# Patient Record
Sex: Male | Born: 1945 | Race: Black or African American | Hispanic: No | Smoking: Former smoker
Health system: Southern US, Community
[De-identification: ages and names within clinical notes are randomized; demographics above are authoritative.]

## PROBLEM LIST (undated history)

## (undated) ENCOUNTER — Ambulatory Visit: Admission: EM | Payer: 59 | Source: Home / Self Care

## (undated) DIAGNOSIS — E785 Hyperlipidemia, unspecified: Secondary | ICD-10-CM

## (undated) DIAGNOSIS — L910 Hypertrophic scar: Secondary | ICD-10-CM

## (undated) DIAGNOSIS — Z7901 Long term (current) use of anticoagulants: Secondary | ICD-10-CM

## (undated) DIAGNOSIS — I639 Cerebral infarction, unspecified: Secondary | ICD-10-CM

## (undated) DIAGNOSIS — F1910 Other psychoactive substance abuse, uncomplicated: Secondary | ICD-10-CM

## (undated) DIAGNOSIS — I10 Essential (primary) hypertension: Secondary | ICD-10-CM

## (undated) DIAGNOSIS — I2699 Other pulmonary embolism without acute cor pulmonale: Secondary | ICD-10-CM

## (undated) DIAGNOSIS — I5022 Chronic systolic (congestive) heart failure: Secondary | ICD-10-CM

## (undated) DIAGNOSIS — I2510 Atherosclerotic heart disease of native coronary artery without angina pectoris: Secondary | ICD-10-CM

## (undated) DIAGNOSIS — I513 Intracardiac thrombosis, not elsewhere classified: Secondary | ICD-10-CM

## (undated) HISTORY — DX: Hypertrophic scar: L91.0

## (undated) HISTORY — DX: Essential (primary) hypertension: I10

## (undated) HISTORY — DX: Cerebral infarction, unspecified: I63.9

## (undated) HISTORY — DX: Other psychoactive substance abuse, uncomplicated: F19.10

## (undated) HISTORY — DX: Long term (current) use of anticoagulants: Z79.01

## (undated) HISTORY — DX: Other pulmonary embolism without acute cor pulmonale: I26.99

## (undated) HISTORY — DX: Hyperlipidemia, unspecified: E78.5

## (undated) HISTORY — DX: Atherosclerotic heart disease of native coronary artery without angina pectoris: I25.10

## (undated) HISTORY — DX: Intracardiac thrombosis, not elsewhere classified: I51.3

## (undated) HISTORY — DX: Chronic systolic (congestive) heart failure: I50.22

---

## 1996-04-07 DIAGNOSIS — I2510 Atherosclerotic heart disease of native coronary artery without angina pectoris: Secondary | ICD-10-CM

## 1996-04-07 HISTORY — DX: Atherosclerotic heart disease of native coronary artery without angina pectoris: I25.10

## 1996-06-05 HISTORY — PX: PTCA: SHX146

## 2000-11-11 ENCOUNTER — Emergency Department (HOSPITAL_COMMUNITY): Admission: EM | Admit: 2000-11-11 | Discharge: 2000-11-11 | Payer: Self-pay | Admitting: *Deleted

## 2002-04-21 ENCOUNTER — Emergency Department (HOSPITAL_COMMUNITY): Admission: EM | Admit: 2002-04-21 | Discharge: 2002-04-21 | Payer: Self-pay | Admitting: Internal Medicine

## 2002-06-14 ENCOUNTER — Emergency Department (HOSPITAL_COMMUNITY): Admission: EM | Admit: 2002-06-14 | Discharge: 2002-06-14 | Payer: Self-pay | Admitting: *Deleted

## 2002-06-14 ENCOUNTER — Encounter: Payer: Self-pay | Admitting: *Deleted

## 2002-06-18 ENCOUNTER — Encounter: Payer: Self-pay | Admitting: Cardiology

## 2002-06-20 ENCOUNTER — Inpatient Hospital Stay (HOSPITAL_COMMUNITY): Admission: AD | Admit: 2002-06-20 | Discharge: 2002-06-23 | Payer: Self-pay | Admitting: Cardiology

## 2002-06-21 ENCOUNTER — Encounter: Payer: Self-pay | Admitting: Cardiology

## 2002-09-12 ENCOUNTER — Encounter (HOSPITAL_COMMUNITY): Admission: RE | Admit: 2002-09-12 | Discharge: 2002-10-12 | Payer: Self-pay | Admitting: Cardiology

## 2002-09-12 ENCOUNTER — Encounter: Payer: Self-pay | Admitting: Cardiology

## 2004-03-20 ENCOUNTER — Emergency Department (HOSPITAL_COMMUNITY): Admission: EM | Admit: 2004-03-20 | Discharge: 2004-03-20 | Payer: Self-pay | Admitting: *Deleted

## 2004-08-30 ENCOUNTER — Ambulatory Visit: Payer: Self-pay | Admitting: *Deleted

## 2004-11-08 ENCOUNTER — Emergency Department (HOSPITAL_COMMUNITY): Admission: EM | Admit: 2004-11-08 | Discharge: 2004-11-08 | Payer: Self-pay | Admitting: Emergency Medicine

## 2005-07-28 ENCOUNTER — Ambulatory Visit: Payer: Self-pay | Admitting: *Deleted

## 2005-08-25 ENCOUNTER — Emergency Department (HOSPITAL_COMMUNITY): Admission: EM | Admit: 2005-08-25 | Discharge: 2005-08-25 | Payer: Self-pay | Admitting: Emergency Medicine

## 2006-07-20 ENCOUNTER — Ambulatory Visit: Payer: Self-pay | Admitting: Cardiovascular Disease

## 2006-07-28 ENCOUNTER — Ambulatory Visit: Payer: Self-pay | Admitting: Cardiovascular Disease

## 2006-07-28 ENCOUNTER — Encounter (HOSPITAL_COMMUNITY): Admission: RE | Admit: 2006-07-28 | Discharge: 2006-08-27 | Payer: Self-pay | Admitting: Cardiovascular Disease

## 2006-11-09 ENCOUNTER — Ambulatory Visit (HOSPITAL_COMMUNITY): Admission: RE | Admit: 2006-11-09 | Discharge: 2006-11-09 | Payer: Self-pay | Admitting: Gastroenterology

## 2006-11-09 ENCOUNTER — Ambulatory Visit: Payer: Self-pay | Admitting: Gastroenterology

## 2007-07-22 ENCOUNTER — Ambulatory Visit: Payer: Self-pay | Admitting: Cardiovascular Disease

## 2007-11-20 ENCOUNTER — Emergency Department (HOSPITAL_COMMUNITY): Admission: EM | Admit: 2007-11-20 | Discharge: 2007-11-20 | Payer: Self-pay | Admitting: Emergency Medicine

## 2007-11-28 ENCOUNTER — Emergency Department (HOSPITAL_COMMUNITY): Admission: EM | Admit: 2007-11-28 | Discharge: 2007-11-28 | Payer: Self-pay | Admitting: Emergency Medicine

## 2008-06-05 ENCOUNTER — Ambulatory Visit: Payer: Self-pay | Admitting: Physician Assistant

## 2008-06-08 ENCOUNTER — Ambulatory Visit: Payer: Self-pay | Admitting: Cardiology

## 2008-06-08 ENCOUNTER — Encounter: Payer: Self-pay | Admitting: Cardiology

## 2008-06-08 ENCOUNTER — Ambulatory Visit (HOSPITAL_COMMUNITY): Admission: RE | Admit: 2008-06-08 | Discharge: 2008-06-08 | Payer: Self-pay | Admitting: Cardiology

## 2008-11-24 DIAGNOSIS — I10 Essential (primary) hypertension: Secondary | ICD-10-CM | POA: Insufficient documentation

## 2008-11-24 DIAGNOSIS — E785 Hyperlipidemia, unspecified: Secondary | ICD-10-CM | POA: Insufficient documentation

## 2008-11-24 DIAGNOSIS — E782 Mixed hyperlipidemia: Secondary | ICD-10-CM | POA: Insufficient documentation

## 2008-12-14 ENCOUNTER — Ambulatory Visit: Payer: Self-pay | Admitting: Cardiovascular Disease

## 2009-06-27 ENCOUNTER — Emergency Department (HOSPITAL_COMMUNITY): Admission: EM | Admit: 2009-06-27 | Discharge: 2009-06-27 | Payer: Self-pay | Admitting: Emergency Medicine

## 2009-09-20 ENCOUNTER — Telehealth (INDEPENDENT_AMBULATORY_CARE_PROVIDER_SITE_OTHER): Payer: Self-pay

## 2009-09-25 ENCOUNTER — Ambulatory Visit: Payer: Self-pay | Admitting: Cardiovascular Disease

## 2009-12-19 ENCOUNTER — Emergency Department (HOSPITAL_COMMUNITY)
Admission: EM | Admit: 2009-12-19 | Discharge: 2009-12-20 | Payer: Self-pay | Source: Home / Self Care | Admitting: Emergency Medicine

## 2010-01-01 ENCOUNTER — Encounter: Payer: Self-pay | Admitting: Adult Health

## 2010-01-01 ENCOUNTER — Ambulatory Visit: Payer: Self-pay | Admitting: Cardiology

## 2010-01-01 ENCOUNTER — Encounter (INDEPENDENT_AMBULATORY_CARE_PROVIDER_SITE_OTHER): Payer: Self-pay | Admitting: *Deleted

## 2010-01-11 ENCOUNTER — Ambulatory Visit: Payer: Self-pay | Admitting: Cardiology

## 2010-01-11 ENCOUNTER — Encounter (HOSPITAL_COMMUNITY): Admission: RE | Admit: 2010-01-11 | Discharge: 2010-01-11 | Payer: Self-pay | Admitting: Cardiology

## 2010-01-15 ENCOUNTER — Ambulatory Visit: Payer: Self-pay | Admitting: Cardiology

## 2010-01-15 DIAGNOSIS — R931 Abnormal findings on diagnostic imaging of heart and coronary circulation: Secondary | ICD-10-CM | POA: Insufficient documentation

## 2010-03-07 DIAGNOSIS — I2699 Other pulmonary embolism without acute cor pulmonale: Secondary | ICD-10-CM

## 2010-03-07 HISTORY — DX: Other pulmonary embolism without acute cor pulmonale: I26.99

## 2010-03-13 ENCOUNTER — Inpatient Hospital Stay (HOSPITAL_COMMUNITY)
Admission: EM | Admit: 2010-03-13 | Discharge: 2010-03-26 | Disposition: A | Discharge status: Other Acute Inpatient Hospital | Payer: Self-pay | Source: Home / Self Care | Attending: Internal Medicine | Admitting: Internal Medicine

## 2010-03-14 ENCOUNTER — Inpatient Hospital Stay (HOSPITAL_COMMUNITY): Admission: EM | Admit: 2010-03-14 | Discharge: 2010-03-26 | Payer: Self-pay | Attending: Surgery | Admitting: Surgery

## 2010-03-15 ENCOUNTER — Encounter: Payer: Self-pay | Admitting: Surgery

## 2010-03-15 ENCOUNTER — Encounter: Payer: Self-pay | Admitting: Cardiology

## 2010-03-18 HISTORY — PX: CORONARY ARTERY BYPASS GRAFT: SHX141

## 2010-03-30 ENCOUNTER — Emergency Department (HOSPITAL_COMMUNITY)
Admission: EM | Admit: 2010-03-30 | Discharge: 2010-03-30 | Disposition: A | Discharge status: Other Acute Inpatient Hospital | Payer: Self-pay | Source: Home / Self Care | Admitting: Emergency Medicine

## 2010-03-30 ENCOUNTER — Inpatient Hospital Stay (HOSPITAL_COMMUNITY)
Admission: EM | Admit: 2010-03-30 | Discharge: 2010-04-09 | Payer: Self-pay | Attending: Cardiology | Admitting: Cardiology

## 2010-04-02 ENCOUNTER — Encounter: Payer: Self-pay | Admitting: Cardiology

## 2010-04-08 ENCOUNTER — Encounter (INDEPENDENT_AMBULATORY_CARE_PROVIDER_SITE_OTHER): Payer: Self-pay | Admitting: Cardiology

## 2010-04-09 ENCOUNTER — Ambulatory Visit: Admit: 2010-04-09 | Payer: Self-pay | Admitting: Physician Assistant

## 2010-04-11 ENCOUNTER — Ambulatory Visit
Admission: RE | Admit: 2010-04-11 | Discharge: 2010-04-11 | Payer: Self-pay | Source: Home / Self Care | Attending: Cardiology | Admitting: Cardiology

## 2010-04-11 LAB — CONVERTED CEMR LAB: POC INR: 4

## 2010-04-15 ENCOUNTER — Ambulatory Visit
Admission: RE | Admit: 2010-04-15 | Discharge: 2010-04-15 | Payer: Self-pay | Source: Home / Self Care | Attending: Surgery | Admitting: Surgery

## 2010-04-15 ENCOUNTER — Encounter
Admission: RE | Admit: 2010-04-15 | Discharge: 2010-04-15 | Payer: Self-pay | Source: Home / Self Care | Attending: Surgery | Admitting: Surgery

## 2010-04-19 ENCOUNTER — Ambulatory Visit: Admission: RE | Admit: 2010-04-19 | Discharge: 2010-04-19 | Payer: Self-pay | Source: Home / Self Care

## 2010-04-19 LAB — CONVERTED CEMR LAB: POC INR: 2.5

## 2010-04-26 ENCOUNTER — Encounter: Payer: Self-pay | Admitting: Adult Health

## 2010-04-26 ENCOUNTER — Ambulatory Visit
Admission: RE | Admit: 2010-04-26 | Discharge: 2010-04-26 | Payer: Self-pay | Source: Home / Self Care | Attending: Adult Health | Admitting: Adult Health

## 2010-04-26 ENCOUNTER — Telehealth (INDEPENDENT_AMBULATORY_CARE_PROVIDER_SITE_OTHER): Payer: Self-pay

## 2010-05-02 ENCOUNTER — Ambulatory Visit: Admission: RE | Admit: 2010-05-02 | Discharge: 2010-05-02 | Payer: Self-pay | Source: Home / Self Care

## 2010-05-02 LAB — CONVERTED CEMR LAB: POC INR: 4.3

## 2010-05-07 ENCOUNTER — Ambulatory Visit
Admission: RE | Admit: 2010-05-07 | Discharge: 2010-05-07 | Payer: Self-pay | Source: Home / Self Care | Attending: Surgery | Admitting: Surgery

## 2010-05-07 ENCOUNTER — Telehealth (INDEPENDENT_AMBULATORY_CARE_PROVIDER_SITE_OTHER): Payer: Self-pay | Admitting: *Deleted

## 2010-05-07 NOTE — Assessment & Plan Note (Signed)
Summary: past due for 6 mth f/u per pt instructions/tg   Visit Type:  Follow-up Primary Provider:  [name]  CC:  no cardiology complaints.  History of Present Illness: [name] is seen today for F/U of CAD, HTN and hyperlipidemia.  He has a distant history of IMI with stenting in 98 and repeat intervention to the RCA and LAD in 2004.  He was last seen by Tereso Newcomer in 2009.  His primary is [name].  The patient is on disability and is sedentary.  He smokes marajuana on occasion but no cigarettes.  He denies SSCP, palpitations, SOB, PND or othopnea.  He has been compliant with his meds.  He is due to get blood work at Dr. Valeda Malm office this month.  He indicates that his cholesterol is good and liver normal.  He inquired about Viagra and I discouraged him from using it given his CAD  Current Problems (verified): 1)  Drug Abuse, Hx of  (ICD-V15.89) 2)  Hyperlipidemia  (ICD-272.4) 3)  Hypertension  (ICD-401.9) 4)  Cad  (ICD-414.00)  Current Medications (verified): 1)  Aspirin 325 Mg Tabs (Aspirin) .... Take 1 Tab Daily 2)  Metoprolol Succinate 50 Mg Xr24h-Tab (Metoprolol Succinate) .... Take 1 1/2 Tab Daily 3)  Simvastatin 40 Mg Tabs (Simvastatin) .... Take 1 Tab Daily  Allergies (verified): 1)  ! * Lisinopril  Past History:  Past Medical History: Last updated: 12-14-08 Current Problems:  DRUG ABUSE, HX OF (ICD-V15.89) HYPERLIPIDEMIA (ICD-272.4) HYPERTENSION (ICD-401.9) CAD (ICD-414.00)  Past Surgical History: Last updated: 2008-12-14 cath (3/04)  3/98PTCA LAD and RCA  Family History: Last updated: 2008/12/14 Father:deceased cause unknown Mother:deceased in 40's due to cancer type unknown  Social History: Last updated: 14-Dec-2008 Full Time Married  Tobacco Use - Yes.  Alcohol Use - no Regular Exercise - no Drug Use - yes(cocaine abuse former)  Review of Systems       Denies fever, malais, weight loss, blurry vision, decreased visual acuity, cough, sputum, SOB,  hemoptysis, pleuritic pain, palpitaitons, heartburn, abdominal pain, melena, lower extremity edema, claudication, or rash.   Vital Signs:  Patient profile:   65 year old male Weight:      220 pounds BMI:     35.64 Pulse rate:   65 / minute BP sitting:   128 / 80  (right arm)  Vitals Entered By: Dreama Saa, CNA (September 25, 2009 11:47 AM)  Physical Exam  General:  Affect appropriate Healthy:  appears stated age HEENT: normal Neck supple with no adenopathy JVP normal no bruits no thyromegaly Lungs clear with no wheezing and good diaphragmatic motion Heart:  S1/S2 no murmur,rub, gallop or click PMI normal Abdomen: benighn, BS positve, no tenderness, no AAA no bruit.  No HSM or HJR Distal pulses intact with no bruits No edema Neuro non-focal Skin warm and dry    Impression & Recommendations:  Problem # 1:  CAD (ICD-414.00) Stable no angina.  Continue ASA and BB The following medications were removed from the medication list:    Lisinopril 20 Mg Tabs (Lisinopril) .Marland Kitchen... Take 1 tablet by mouth once a day His updated medication list for this problem includes:    Aspirin 325 Mg Tabs (Aspirin) .Marland Kitchen... Take 1 tab daily    Metoprolol Succinate 50 Mg Xr24h-tab (Metoprolol succinate) .Marland Kitchen... Take 1 1/2 tab daily  Problem # 2:  HYPERLIPIDEMIA (ICD-272.4) Continue simvastatin F/U labs primary His updated medication list for this problem includes:    Simvastatin 40 Mg Tabs (Simvastatin) .Marland Kitchen... Take 1 tab  daily  Problem # 3:  HYPERTENSION (ICD-401.9) Well controlled The following medications were removed from the medication list:    Lisinopril 20 Mg Tabs (Lisinopril) .Marland Kitchen... Take 1 tablet by mouth once a day His updated medication list for this problem includes:    Aspirin 325 Mg Tabs (Aspirin) .Marland Kitchen... Take 1 tab daily    Metoprolol Succinate 50 Mg Xr24h-tab (Metoprolol succinate) .Marland Kitchen... Take 1 1/2 tab daily  Problem # 4:  DRUG ABUSE, HX OF (ICD-V15.89) Indicates he is no longer using  marajuana.    Patient Instructions: 1)  Your physician recommends that you schedule a follow-up appointment in: 6 months 2)  Your physician recommends that you continue on your current medications as directed. Please refer to the Current Medication list given to you today.

## 2010-05-07 NOTE — Assessment & Plan Note (Signed)
Summary: POST ED VISIT ON 12/19/09 PER PT REQUEST/TG   Visit Type:  Follow-up Primary Provider:  [name]  CC:  no cardiology complaints.  History of Present Illness: [name] is a pleasant  65 y/o AAM we are seeing on follow-up after being evaluated in the ER for complaints of RUQ pain radiating into the chest.  He has a history of CAD with inferior Mi and stent to RCA and LAD in 2004, hypertension, hyperlipidemia and former marijana abuse.  He was seen in the ER on 12/20/2009 and was negative for MI with 3 sets of cardiac enzymes cycled and found to be negative.  Review of EKG from ER did not show acute changes.  The pain lasted approximately 15 minutes and started in the RUQ and radiated midsternally. This pain was different from angina pain which occured prior to stents in 2004 which is described as midsternal chest pressure and pain.  He has not had recurrence of pain since ER visit.  Current Medications (verified): 1)  Aspirin 325 Mg Tabs (Aspirin) .... Take 1 Tab Daily 2)  Metoprolol Succinate 50 Mg Xr24h-Tab (Metoprolol Succinate) .... Take 1 1/2 Tab Daily 3)  Simvastatin 40 Mg Tabs (Simvastatin) .... Take 1 Tab Daily 4)  Nitrostat 0.4 Mg Subl (Nitroglycerin) .Marland Kitchen.. 1 Tablet Under Tongue At Onset of Chest Pain; You May Repeat Every 5 Minutes For Up To 3 Doses.  Allergies (verified): 1)  ! * Lisinopril  Past History:  Past medical, surgical, family and social histories (including risk factors) reviewed, and no changes noted (except as noted below).  Past Medical History: Reviewed history from 11/24/2008 and no changes required. Current Problems:  DRUG ABUSE, HX OF (ICD-V15.89) HYPERLIPIDEMIA (ICD-272.4) HYPERTENSION (ICD-401.9) CAD (ICD-414.00)  Past Surgical History: Reviewed history from 11/24/2008 and no changes required. cath (3/04)  3/98PTCA LAD and RCA  Family History: Reviewed history from 11/24/2008 and no changes required. Father:deceased cause  unknown Mother:deceased in 40's due to cancer type unknown  Social History: Reviewed history from 11/24/2008 and no changes required. Full Time Married  Tobacco Use - Yes.  Alcohol Use - no Regular Exercise - no Drug Use - yes(cocaine abuse former)  Review of Systems       Chest pain All other systems have been reviewed and are negative unless stated above.   Vital Signs:  Patient profile:   65 year old male Weight:      217 pounds BMI:     35.15 Pulse rate:   72 / minute BP sitting:   139 / 87  (right arm)  Vitals Entered By: Dreama Saa, CNA (January 01, 2010 2:48 PM)  Physical Exam  General:  Well developed, well nourished, in no acute distress. Lungs:  Clear bilaterally to auscultation and percussion. Heart:  Non-displaced PMI, chest non-tender; regular rate and rhythm, S1, S2 without murmurs, rubs or gallops. Carotid upstroke normal, no bruit. Normal abdominal aortic size, no bruits. Femorals normal pulses, no bruits. Pedals normal pulses. No edema, no varicosities. Abdomen:  Bowel sounds positive; abdomen soft and non-tender without masses, organomegaly, or hernias noted. No hepatosplenomegaly. Msk:  Back normal, normal gait. Muscle strength and tone normal. Pulses:  pulses normal in all 4 extremities Extremities:  No clubbing or cyanosis. Neurologic:  Alert and oriented x 3. Psych:  Normal affect.   EKG  Procedure date:  01/01/2010  Findings:      Normal sinus rhythm with rate of:  69 bpm  Impression & Recommendations:  Problem # 1:  CAD (ICD-414.00) His chest pain appears atypical with orgination beginning in the RUQ of the abdomen and is not similar to the pain he experienced with prior ischemic events.  His assessment is normal.  There is not evidence of new ischemia on EKG.  Plan stress myoview to evaluate for ischemia with known history of CAD.  He is given a RX for NTG.  He will continue his same medications and follow-up with Dr. Eden Emms.  IF  negative test may need to evaluate GI etiiology for discomfort as it originated in the abdomen. His updated medication list for this problem includes:    Aspirin 325 Mg Tabs (Aspirin) .Marland Kitchen... Take 1 tab daily    Metoprolol Succinate 50 Mg Xr24h-tab (Metoprolol succinate) .Marland Kitchen... Take 1 1/2 tab daily    Nitrostat 0.4 Mg Subl (Nitroglycerin) .Marland Kitchen... 1 tablet under tongue at onset of chest pain; you may repeat every 5 minutes for up to 3 doses.  Orders: Nuclear Stress Test (Nuc Stress Test)  Problem # 2:  HYPERLIPIDEMIA (ICD-272.4) Assessment: Unchanged  His updated medication list for this problem includes:    Simvastatin 40 Mg Tabs (Simvastatin) .Marland Kitchen... Take 1 tab daily  Problem # 3:  HYPERTENSION (ICD-401.9) Well controlled at this time. His updated medication list for this problem includes:    Aspirin 325 Mg Tabs (Aspirin) .Marland Kitchen... Take 1 tab daily    Metoprolol Succinate 50 Mg Xr24h-tab (Metoprolol succinate) .Marland Kitchen... Take 1 1/2 tab daily  Orders: Nuclear Stress Test (Nuc Stress Test)  Patient Instructions: 1)  Your physician recommends that you schedule a follow-up appointment in: after testing 2)  Your physician has requested that you have an exercise stress myoview.  For further information please visit [email or web address]  Please follow instruction sheet, as given. Prescriptions: NITROSTAT 0.4 MG SUBL (NITROGLYCERIN) 1 tablet under tongue at onset of chest pain; you may repeat every 5 minutes for up to 3 doses.  #25 x 2   Entered by:   Teressa Lower RN   Authorized by:   Joni Reining, NP   Signed by:   Teressa Lower RN on 01/01/2010   Method used:   [facility] to        [facility] [phone][facility] (retail)       [address]       [place], [place]  [address]       Ph: [phone] or [phone]       Fax: [phone]   RxID:   [id number]

## 2010-05-07 NOTE — Progress Notes (Signed)
**Note De-Identified [facility] Obfuscation** Summary: refills  Phone Note Outgoing Call   Call placed by: Larita Fife Damyah Gugel LPN,  September 20, 2009 3:54 PM Summary of Call: According to OV note from 12-14-08 pt. was to f/u in 6 months with Dr. Eden Emms and needs refills. Pt. agreed to schedule appt. which is 09-25-09. [facility] [place].  Initial call taken by: Larita Fife Shyleigh Daughtry LPN,  September 20, 2009 4:05 PM    Prescriptions: METOPROLOL SUCCINATE 50 MG XR24H-TAB (METOPROLOL SUCCINATE) take 1 1/2 tab daily  #45 x 0   Entered by:   Larita Fife Deandre Stansel LPN   Authorized by:   Colon Branch, MD, [facility]   Signed by:   Larita Fife Jayanth Szczesniak LPN on 44/04/270   Method used:   Electronically to        [facility] [phone]* (retail)       [address]       [place], [place]  [address]       Ph: [phone] or [phone]       Fax: [phone]   RxID:   [id number]

## 2010-05-07 NOTE — Assessment & Plan Note (Signed)
Summary: 6 MN F/U PER CKOUT 06/05/08-DSF  Medications Added LISINOPRIL 20 MG TABS (LISINOPRIL) Take 1 tablet by mouth once a day      Allergies Added:   History of Present Illness: [name] is seen today for F/U of CAD, HTN and hyperlipidemia.  He has a distant history of IMI with stenting in 98 and repeat intervention to the RCA and LAD in 2004.  He was last seen by Tereso Newcomer in 2009.  His primary is [name].  The patient is on disability and is sedentary.  He smokes marajuana on occasion but no cigarettes.  He denies SSCP, palpitations, SOB, PND or othopnea.  He has been compliant with his meds.  He is due to get blood work at Dr. Valeda Malm office this month.    Current Problems (verified): 1)  Drug Abuse, Hx of  (ICD-V15.89) 2)  Hyperlipidemia  (ICD-272.4) 3)  Hypertension  (ICD-401.9) 4)  Cad  (ICD-414.00)  Current Medications (verified): 1)  Aspirin 325 Mg Tabs (Aspirin) .... Take 1 Tab Daily 2)  Metoprolol Succinate 50 Mg Xr24h-Tab (Metoprolol Succinate) .... Take 1 1/2 Tab Daily 3)  Simvastatin 40 Mg Tabs (Simvastatin) .... Take 1 Tab Daily  Allergies (verified): 1)  ! * Lisinopril  Past History:  Past Medical History: Last updated: December 06, 2008 Current Problems:  DRUG ABUSE, HX OF (ICD-V15.89) HYPERLIPIDEMIA (ICD-272.4) HYPERTENSION (ICD-401.9) CAD (ICD-414.00)  Past Surgical History: Last updated: 06-Dec-2008 cath (3/04)  3/98PTCA LAD and RCA  Family History: Last updated: Dec 06, 2008 Father:deceased cause unknown Mother:deceased in 40's due to cancer type unknown  Social History: Last updated: 2008/12/06 Full Time Married  Tobacco Use - Yes.  Alcohol Use - no Regular Exercise - no Drug Use - yes(cocaine abuse former)  Review of Systems       Denies fever, malais, weight loss, blurry vision, decreased visual acuity, cough, sputum, SOB, hemoptysis, pleuritic pain, palpitaitons, heartburn, abdominal pain, melena, lower extremity edema, claudication, or  rash. All other systems reviewed and negative  Vital Signs:  Patient profile:   65 year old male Height:      66 inches Weight:      225 pounds BMI:     36.45 Pulse rate:   76 / minute Pulse rhythm:   regular Resp:     14 per minute BP sitting:   120 / 78  (right arm)  Vitals Entered By: Dreama Saa, CNA (December 14, 2008 9:53 AM)  Physical Exam  General:  Affect appropriate Healthy:  appears stated age HEENT: normal Neck supple with no adenopathy JVP normal no bruits no thyromegaly Lungs clear with no wheezing and good diaphragmatic motion Heart:  S1/S2 no murmur,rub, gallop or click PMI normal Abdomen: benighn, BS positve, no tenderness, no AAA no bruit.  No HSM or HJR Distal pulses intact with no bruits No edema Neuro non-focal Skin warm and dry    Impression & Recommendations:  Problem # 1:  CAD (ICD-414.00) Stabel no angina continue ASA and BB His updated medication list for this problem includes:    Aspirin 325 Mg Tabs (Aspirin) .Marland Kitchen... Take 1 tab daily    Metoprolol Succinate 50 Mg Xr24h-tab (Metoprolol succinate) .Marland Kitchen... Take 1 1/2 tab daily    Lisinopril 20 Mg Tabs (Lisinopril) .Marland Kitchen... Take 1 tablet by mouth once a day  Problem # 2:  HYPERLIPIDEMIA (ICD-272.4) F/U labs with Dr. Felecia Shelling no myalgias or side effects.  Target LDL 80 His updated medication list for this problem includes:    Simvastatin 40  Mg Tabs (Simvastatin) .Marland Kitchen... Take 1 tab daily  Problem # 3:  HYPERTENSION (ICD-401.9) Well controlled continue low sodium diet His updated medication list for this problem includes:    Aspirin 325 Mg Tabs (Aspirin) .Marland Kitchen... Take 1 tab daily    Metoprolol Succinate 50 Mg Xr24h-tab (Metoprolol succinate) .Marland Kitchen... Take 1 1/2 tab daily    Lisinopril 20 Mg Tabs (Lisinopril) .Marland Kitchen... Take 1 tablet by mouth once a day  Patient Instructions: 1)  Your physician recommends that you schedule a follow-up appointment in: 6 months 2)  Your physician recommends that you continue on  your current medications as directed. Please refer to the Current Medication list given to you today.

## 2010-05-07 NOTE — Letter (Signed)
Summary: [facility] [facility] (Nuc Med Stress)  [facility]  [address] [place] [address]   Phone: [phone]  Fax: [phone]    Nuclear Medicine 1-Day Stress Test Information Sheet  Re:     [name]   DOB:     14-Jun-1945 MRN:     [id number] Weight:  Appointment Date: Register at: Appointment Time: Referring MD:  _x__Exercise Stress  __Adenosine   __Dobutamine  __Lexiscan  __Persantine   __Thallium  Urgency: ____1 (next day)   ____2 (one week)    ____3 (PRN)  Patient will receive Follow Up call with results: Patient needs follow-up appointment:  Instructions regarding medication:  How to prepare for your stress test: 1.NOTHING TO EAT OR DRINK AFTER MIDNIGHT TO NOT TAKE YOUR AM MEDICATION BEFORE THE TEST 2. DO NOT use any tobacco products for at leaset 8 hours prior to arrival. 3. DO NOT wear dresses or any clothing that may have metal clasps or buttons. 4. Wear short sleeve shirts, loose clothing, and comfortalbe walking shoes. 5. DO NOT use lotions, oils or powder on your chest before the test. 6. The test will take approximately 3-4 hours from the time you arrive until completion. 7. To register the day of the test, go to the Short Stay entrance at [facility]. 8. If you must cancel your test, call [phone] as soon as you are aware.  After you arrive for test:   When you arrive at [facility], you will go to Short Stay to be registered. They will then send you to Radiology to check in. The Nuclear Medicine Tech will get you and start an IV in your arm or hand. A small amount of a radioactive tracer will then be injected into your IV. This tracer will then have to circulate for 30-45 minutes. During this time you will wait in the waiting room and you will be able to drink something without caffeine. A series of pictures will be taken of your heart follwoing this waiting period. After the 1st set of pictures you will go to the  stress lab to get ready for your stress test. During the stress test, another small amount of a radioactive tracer will be injected through your IV. When the stress test is complete, there is a short rest period while your heart rate and blood pressure will be monitored. When this monitoring period is complete you will have another set of pictrues taken. (The same as the 1st set of pictures). These pictures are taken between 15 minutes and 1 hour after the stress test. The time depends on the type of stress test you had. Your doctor will inform you of your test results within 7 days after test.    The possibilities of certain changes are possible during the test. They include abnormal blood pressure and disorders of the heart. Side effects of persantine or adenosine can include flushing, chest pain, shortness of breath, stomach tightness, headache and light-headedness. These side effects usually do not last long and are self-resolving. Every effort will be made to keep you comfortable and to minimize complications by obtaining a medical history and by close observation during the test. Emergency equipment, medications, and trained personnel are available to deal with any unusual situation which may arise.  Please notify office at least 48 hours in advance if you are unable to keep this appt.

## 2010-05-07 NOTE — Assessment & Plan Note (Signed)
Summary: F/U MYOVIEW TO BE DONE ON 01/11/10/TG   Visit Type:  Follow-up Primary Provider:  [name]   History of Present Illness: [name] is a 65 y/o male with known history of CAD with inferior MI and stent to the RCA and LAD in 2004, hypertension, hyperlipidemia and former marijuana use.  He was seen in the ER 12/20/2009 for chest pain, right sided and was ruled out for MI.  He saw [place] on follow-up where a stress myoview was ordered. He is here for the results.  He denies any recurrent chest pain since the ER visit.  Current Medications (verified): 1)  Aspirin 325 Mg Tabs (Aspirin) .... Take 1 Tab Daily 2)  Metoprolol Succinate 50 Mg Xr24h-Tab (Metoprolol Succinate) .... Take 1 1/2 Tab Daily 3)  Simvastatin 40 Mg Tabs (Simvastatin) .... Take 1 Tab Daily 4)  Nitrostat 0.4 Mg Subl (Nitroglycerin) .Marland Kitchen.. 1 Tablet Under Tongue At Onset of Chest Pain; You May Repeat Every 5 Minutes For Up To 3 Doses.  Allergies (verified): 1)  ! * Lisinopril  Comments:  Nurse/Medical Assistant: patient brought med bottles and reviewed med list from previous visit  stated all med are correct  Review of Systems       All other systems have been reviewed and are negative unless stated above.   Vital Signs:  Patient profile:   65 year old male Weight:      218 pounds BMI:     35.31 Pulse rate:   64 / minute BP sitting:   128 / 81  (right arm)  Vitals Entered By: Dreama Saa, CNA (January 15, 2010 10:45 AM)  Physical Exam  General:  Well developed, well nourished, in no acute distress. Lungs:  Clear bilaterally to auscultation and percussion. Heart:  Non-displaced PMI, chest non-tender; regular rate and rhythm, S1, S2 without murmurs, rubs or gallops. Carotid upstroke normal, no bruit. Normal abdominal aortic size, no bruits. Femorals normal pulses, no bruits. Pedals normal pulses. No edema, no varicosities. Abdomen:  Obese Msk:  Back normal, normal gait. Muscle strength and tone  normal. Extremities:  No clubbing or cyanosis. Neurologic:  Alert and oriented x 3. Psych:  Normal affect.   Impression & Recommendations:  Problem # 1:  ABNORMAL CV (STRESS) TEST (ICD-794.39) I have reviewed the nuclear study report which demonstrated abnormal impaired exercise capasity, normal LV size, minimally impaired LV systolic fx. A small and mild infereolateral defect was present showing no reversibility. Moderate apical defect of mild to moderate intensity with minimal if any reverisibility was apparent.  EF was 40%.  I have discussed with the patient that there was a small area of abnormality and discussed possibility of cardiac catherization to evaluate further.  However, I waited to order until speaking with Dr. Dietrich Pates who also reviewed the test results and spoke with the patient.  He advises continued medical management at this time.  If the patient has recurrent chest pain he is to notify us.  Will see him in 6 months.  Problem # 2:  CORONARY ATHEROSCLEROSIS NATIVE CORONARY ARTERY (ICD-414.01) Assessment: Unchanged  His updated medication list for this problem includes:    Aspirin 325 Mg Tabs (Aspirin) .Marland Kitchen... Take 1 tab daily    Metoprolol Succinate 50 Mg Xr24h-tab (Metoprolol succinate) .Marland Kitchen... Take 1 1/2 tab daily    Nitrostat 0.4 Mg Subl (Nitroglycerin) .Marland Kitchen... 1 tablet under tongue at onset of chest pain; you may repeat every 5 minutes for up to 3 doses.  Problem # 3:  HYPERTENSION (ICD-401.9) Assessment: Unchanged  His updated medication list for this problem includes:    Aspirin 325 Mg Tabs (Aspirin) .Marland Kitchen... Take 1 tab daily    Metoprolol Succinate 50 Mg Xr24h-tab (Metoprolol succinate) .Marland Kitchen... Take 1 1/2 tab daily  Patient Instructions: 1)  Your physician recommends that you schedule a follow-up appointment in: 6 months 2)  Your physician recommends that you continue on your current medications as directed. Please refer to the Current Medication list given to you today.

## 2010-05-08 NOTE — Assessment & Plan Note (Signed)
OFFICE VISIT  [name], [name] DOB:  [birth year]                                        May 07, 2010 CHART #:  [id number]  The patient returned to my office today for examination of his chest incision status post coronary artery bypass graft surgery on March 18, 2010.  I last saw him in the office on April 15, 2010, at which time he was doing well.  He want me to check his chest incision as he says felt like there was a bulge at the top of the incision.  He has had no fever or chills.  He has had mild chest wall discomfort which is relieved with pain medicine.  He has getting ready to start cardiac rehab.  He continues on Coumadin for development of postoperative pulmonary emboli requiring readmission.  His INR is being followed by [facility].  PHYSICAL EXAMINATION:  Vital Signs:  Blood pressure 115/71, pulse 84 and regular, respiratory rate 16 unlabored, oxygen saturation is 94% on room air.  General:  He looks well.  The chest incision is healing well. There is slight protuberance at the top of his sternotomy, but they are different that is usually expected.  There is no sign of any infection or cellulitis.  There is no fluctuance.  His sternum is stable.  His leg incision is healing well.  Lungs:  Clear.  Cardiac:  Shows regular rate and rhythm with normal heart sounds.  IMPRESSION:  The patient's incision is doing fine.  I will see any cause for alarm.  I explained him that this is a normal appearance of the incision and that area will gradually flatten out over time.  He is happy with that and will continue to follow up with his cardiologist.  Evelene Croon, M.D. Electronically Signed  BB/MEDQ  D:  05/07/2010  T:  05/08/2010  Job:  [id number]

## 2010-05-09 NOTE — Medication Information (Signed)
Summary: ccr-lr  Anticoagulant Therapy  Managed by: Vashti Hey, RN PCP: [name] Supervising MD: Diona Browner MD, Remi Deter Indication 1: Pulmonary Embolism Lab Used: [facility] [place] Site: [place] INR POC 2.5  Dietary changes: no    Health status changes: no    Bleeding/hemorrhagic complications: no    Recent/future hospitalizations: no    Any changes in medication regimen? no    Recent/future dental: no  Any missed doses?: no       Is patient compliant with meds? yes       Allergies: 1)  ! * Lisinopril  Anticoagulation Management History:      The patient is taking warfarin and comes in today for a routine follow up visit.  Anticoagulation is being administered due to Bilateral Pulmonary Emboli.  Negative risk factors for bleeding include an age less than 49 years old, no history of CVA/TIA, no history of GI bleeding, and absence of serious comorbidities.  The bleeding index is 'low risk'.  Positive CHADS2 values include History of HTN.  Negative CHADS2 values include History of CHF, Age > 5 years old, History of Diabetes, and Prior Stroke/CVA/TIA.  The start date was 04/09/2010.  Anticoagulation responsible provider: Diona Browner MD, Remi Deter.  INR POC: 2.5.  Cuvette Lot#: [id number].    Anticoagulation Management Assessment/Plan:      The target INR is 2.0-3.0.  The next INR is due 05/02/2010.  Anticoagulation instructions were given to patient.  Results were reviewed/authorized by Vashti Hey, RN.  He was notified by Vashti Hey RN.         Prior Anticoagulation Instructions: INR 4.0 Hold coumadin tonight then decrease coumadin to 5mg  once daily except 2.5mg  on Mondays, Wednesdays and Fridays  Current Anticoagulation Instructions: INR 2.5 Continue coumadin 5mg  once daily except 2.5mg  on Mondays, Wednesdays and Fridays

## 2010-05-09 NOTE — Medication Information (Signed)
Summary: CCN  Anticoagulant Therapy  Managed by: Vashti Hey, RN PCP: Alvira Monday Supervising MD: Dietrich Pates MD, Molly Maduro Indication 1: Pulmonary Embolism Lab Used: [facility] [place] Site: [place] INR POC 4.0  Dietary changes: no    Health status changes: no    Bleeding/hemorrhagic complications: no    Recent/future hospitalizations: yes       Details: In [facility] 12/24 - 04/09/10  for bilateral pulm emboli after CABG  Any changes in medication regimen? yes       Details: Has warfarin 5mg  tablet and has been taking 5mg  qd since d/c  Recent/future dental: no  Any missed doses?: no       Is patient compliant with meds? yes       Allergies: 1)  ! * Lisinopril  Anticoagulation Management History:      The patient comes in today for his initial visit for anticoagulation therapy.  Anticoagulation is being administered due to Bilateral Pulmonary Emboli.  Negative risk factors for bleeding include an age less than 1 years old, no history of CVA/TIA, no history of GI bleeding, and absence of serious comorbidities.  The bleeding index is 'low risk'.  Positive CHADS2 values include History of HTN.  Negative CHADS2 values include History of CHF, Age > 37 years old, History of Diabetes, and Prior Stroke/CVA/TIA.  The start date was 04/09/2010.  Anticoagulation responsible provider: Dietrich Pates MD, Molly Maduro.  INR POC: 4.0.    Anticoagulation Management Assessment/Plan:      The target INR is 2.0-3.0.  The next INR is due 04/19/2010.  Anticoagulation instructions were given to patient.  Results were reviewed/authorized by Vashti Hey, RN.  He was notified by Vashti Hey RN.         Current Anticoagulation Instructions: INR 4.0 Hold coumadin tonight then decrease coumadin to 5mg  once daily except 2.5mg  on Mondays, Wednesdays and Fridays

## 2010-05-09 NOTE — Medication Information (Signed)
Summary: ccr-lr  Anticoagulant Therapy  Managed by: Vashti Hey, RN PCP: [name] Supervising MD: Dietrich Pates MD, Molly Maduro Indication 1: Pulmonary Embolism Lab Used: [facility] [place] Site: [place] INR POC 4.3  Dietary changes: no    Health status changes: no    Bleeding/hemorrhagic complications: no    Recent/future hospitalizations: no    Any changes in medication regimen? no    Recent/future dental: no  Any missed doses?: no       Is patient compliant with meds? yes       Allergies: 1)  ! * Lisinopril  Anticoagulation Management History:      The patient is taking warfarin and comes in today for a routine follow up visit.  Anticoagulation is being administered due to Bilateral Pulmonary Emboli.  Negative risk factors for bleeding include an age less than 29 years old, no history of CVA/TIA, no history of GI bleeding, and absence of serious comorbidities.  The bleeding index is 'low risk'.  Positive CHADS2 values include History of HTN.  Negative CHADS2 values include History of CHF, Age > 27 years old, History of Diabetes, and Prior Stroke/CVA/TIA.  The start date was 04/09/2010.  Anticoagulation responsible provider: Dietrich Pates MD, Molly Maduro.  INR POC: 4.3.  Cuvette Lot#: [id number].    Anticoagulation Management Assessment/Plan:      The patient's current anticoagulation dose is Warfarin sodium 5 mg tabs: m,w,fri 2.5mg  all otherdays he takes 5.  The target INR is 2.0-3.0.  The next INR is due 05/16/2010.  Anticoagulation instructions were given to patient.  Results were reviewed/authorized by Vashti Hey, RN.  He was notified by Vashti Hey RN.         Prior Anticoagulation Instructions: INR 2.5 Continue coumadin 5mg  once daily except 2.5mg  on Mondays, Wednesdays and Fridays  Current Anticoagulation Instructions: INR 4.3 Hold coumadin tonight then decrease dose to 2.5mg  once daily except 5mg  on Tuesdays and Saturdays

## 2010-05-09 NOTE — Assessment & Plan Note (Signed)
Summary: **EPH MC/TMJ   Visit Type:  Follow-up Primary Provider:  [name]   History of Present Illness: [name] is a 65 y/o AAM we are seeing on follow post CABG in the setting of severe 3 vessel CAD with complications of Bilateral pulmonary emboli 1 week later after that discharge.  [name] had CABG  on Mar 18, 2010 (LIMA-LAD, SVG-Diagonal, and SVG to OM1.  He was found to have PE when after passing a constipated stool he became very weak and diaphoretic.  He was readmitted on 03/24/2010 with low 02 sat and CT demonstrated bilateral PE.  He was treated with heparin and has since been placed on coumadin where he is followed by the [facility] office. Last visit one week ago INR 2.5.  He has seen and been released by Dr. Sharee Pimple office on his follow-up appointment Jan 9,2012. He was doing well from their standpoint.  His medications were reviewed and it was found that he was to be on lopressor 75mg   two times a day but was only taking 50mg  two times a day. He was instructed to take it as directed and has been doing so since that time.  He is without complaints today, has gotten his energy back slowly. He walks and is a part of cardiac rehab at [facility].  He still has mild soreness at the sternotomy site but not enough to stop him from his activities.  He is watching salt intake as well.  Current Medications (verified): 1)  Aspir-Low 81 Mg Tbec (Aspirin) .... Take 1 Tab Daily 2)  Metoprolol Succinate 50 Mg Xr24h-Tab (Metoprolol Succinate) .... Take 1 3/4 of Tab 3)  Simvastatin 40 Mg Tabs (Simvastatin) .... Take 1 Tab Daily 4)  Nitrostat 0.4 Mg Subl (Nitroglycerin) .Marland Kitchen.. 1 Tablet Under Tongue At Onset of Chest Pain; You May Repeat Every 5 Minutes For Up To 3 Doses. 5)  Acetaminophen 325 Mg Tabs (Acetaminophen) .... Take As Needed 6)  Tramadol Hcl 50 Mg Tabs (Tramadol Hcl) .... Use As Directed For Pain 7)  Ferrous Sulfate 325 (65 Fe) Mg Tabs (Ferrous Sulfate) .... Take 1 Tab Two Times A Day 8)   Warfarin Sodium 5 Mg Tabs (Warfarin Sodium) .... M,w,fri 2.5mg  All Otherdays He Takes 5 9)  Colace 100 Mg Caps (Docusate Sodium) .... Take As Needed  Allergies (verified): 1)  ! * Lisinopril  Comments:  Nurse/Medical Assistant: patient brought meds lisa checks coumadin  [facility] [place] [facility]  Past History:  Past Medical History: Current Problems:  DRUG ABUSE, HX OF (ICD-V15.89) HYPERLIPIDEMIA (ICD-272.4) HYPERTENSION (ICD-401.9) CAD (ICD-414.00)  Past Surgical History: cath (3/04)  3/98PTCA LAD and RCA CABG LIMA-LAD, SVG to diagonal, OM1 and OM 2 (03/18/2010)  Review of Systems       All other systems have been reviewed and are negative unless stated above.   Vital Signs:  Patient profile:   65 year old male Weight:      199 pounds BMI:     32.24 O2 Sat:      92 % on Room air Pulse rate:   82 / minute BP sitting:   139 / 85  (left arm)  Vitals Entered By: Dreama Saa, CNA (April 26, 2010 11:11 AM)  O2 Flow:  Room air  Physical Exam  General:  Well developed, well nourished, in no acute distress. Neck:  Neck supple, no JVD. No masses, thyromegaly or abnormal cervical nodes. Chest Wall:  Midline sternotomy incision is well healed and without bleeding,  pain or infection Lungs:  Clear bilaterally to auscultation and percussion. Heart:  Distant heart sounds.  No rubs or gallops.  Pulses are palpable, no bruits. Abdomen:  Bowel sounds positive; abdomen soft and non-tender without masses, organomegaly, or hernias noted. No hepatosplenomegaly. Msk:  Back normal, normal gait. Muscle strength and tone normal. Pulses:  pulses normal in all 4 extremities Extremities:  R leg harvest site for SVG at right just above right knee well healed with no signs of infection Neurologic:  Alert and oriented x 3. Psych:  Normal affect.   EKG  Procedure date:  04/26/2010  Findings:      RSR pattern with right ventricular conduction delay. Inferior Q waves are noted in  lead III.  Normal sinus rhythm with rate of:  73 bpm  Impression & Recommendations:  Problem # 1:  PULMONARY EMBOLISM (ICD-415.19) He will continue in the [place] office coumadin clinic for supervision of his INR.  He has no complaints of bleeding and has been complaint with his medications. He is not SOB or having pain. His updated medication list for this problem includes:    Aspir-low 81 Mg Tbec (Aspirin) .Marland Kitchen... Take 1 tab daily    Warfarin Sodium 5 Mg Tabs (Warfarin sodium) ..... M,w,fri 2.5mg  all otherdays he takes 5  Problem # 2:  CAD (ICD-414.00) He is recovering well from CABG in December and is continuing to work with Cardiac Rehab and watch his diet.  He is doing very well at this time. We will see him in 3 months unless he becomes symptomatic. His updated medication list for this problem includes:    Aspir-low 81 Mg Tbec (Aspirin) .Marland Kitchen... Take 1 tab daily    Metoprolol Succinate 50 Mg Xr24h-tab (Metoprolol succinate) .Marland Kitchen... Take 1 3/4 of tab    Nitrostat 0.4 Mg Subl (Nitroglycerin) .Marland Kitchen... 1 tablet under tongue at onset of chest pain; you may repeat every 5 minutes for up to 3 doses.    Warfarin Sodium 5 Mg Tabs (Warfarin sodium) ..... M,w,fri 2.5mg  all otherdays he takes 5  Patient Instructions: 1)  Your physician recommends that you schedule a follow-up appointment in: 3 months 2)  Your physician recommends that you continue on your current medications as directed. Please refer to the Current Medication list given to you today. Prescriptions: TRAMADOL HCL 50 MG TABS (TRAMADOL HCL) use as directed for pain  #30 x 1   Entered by:   Larita Fife Via LPN   Authorized by:   Joni Reining, NP   Signed by:   Larita Fife Via LPN on 28/41/3244   Method used:   Electronically to        [facility] [phone]* (retail)       [address]       [place], [place]  [address]       Ph: [phone]       Fax: [phone]   RxID:   [id number] SIMVASTATIN 40 MG TABS (SIMVASTATIN)  take 1 tab daily  #30 x 3   Entered by:   Larita Fife Via LPN   Authorized by:   Joni Reining, NP   Signed by:   Larita Fife Via LPN on 60/01/9322   Method used:   Electronically to        [facility] [phone]* (retail)       [address]       [place], [place]  [address]       Ph: [phone]       Fax: [phone]   RxID:   [id number]

## 2010-05-09 NOTE — Progress Notes (Signed)
Summary: Refill  Phone Note Call from Patient   Caller: Patient Reason for Call: Talk to Nurse Summary of Call: patient states that he can not find his bottle of NTG / would like another bottle called to pharmacy please / tg Initial call taken by: Raechel Ache [facility],  April 26, 2010 1:36 PM  Follow-up for Phone Call        Pt. aware. Follow-up by: Larita Fife Via LPN,  April 26, 2010 1:57 PM    Prescriptions: NITROSTAT 0.4 MG SUBL (NITROGLYCERIN) 1 tablet under tongue at onset of chest pain; you may repeat every 5 minutes for up to 3 doses.  #25 x 2   Entered by:   Larita Fife Via LPN   Authorized by:   Joni Reining, NP   Signed by:   Larita Fife Via LPN on 21/30/8657   Method used:   Electronically to        [facility] [phone]* (retail)       [address]       [place], [place]  [address]       Ph: [phone]       Fax: [phone]   RxID:   [id number]

## 2010-05-09 NOTE — Letter (Signed)
Summary: [facility]  [facility]   Imported By: Faythe Ghee 04/26/2010 13:39:17  _____________________________________________________________________  External Attachment:    Type:   Image     Comment:   External Document

## 2010-05-13 ENCOUNTER — Ambulatory Visit (HOSPITAL_COMMUNITY): Payer: PRIVATE HEALTH INSURANCE | Attending: Cardiology

## 2010-05-13 DIAGNOSIS — Z951 Presence of aortocoronary bypass graft: Secondary | ICD-10-CM | POA: Insufficient documentation

## 2010-05-13 DIAGNOSIS — Z5189 Encounter for other specified aftercare: Secondary | ICD-10-CM | POA: Insufficient documentation

## 2010-05-13 DIAGNOSIS — I252 Old myocardial infarction: Secondary | ICD-10-CM | POA: Insufficient documentation

## 2010-05-13 DIAGNOSIS — I2510 Atherosclerotic heart disease of native coronary artery without angina pectoris: Secondary | ICD-10-CM | POA: Insufficient documentation

## 2010-05-15 ENCOUNTER — Ambulatory Visit (HOSPITAL_COMMUNITY): Payer: PRIVATE HEALTH INSURANCE | Attending: Cardiology

## 2010-05-15 DIAGNOSIS — Z951 Presence of aortocoronary bypass graft: Secondary | ICD-10-CM | POA: Insufficient documentation

## 2010-05-15 DIAGNOSIS — I252 Old myocardial infarction: Secondary | ICD-10-CM | POA: Insufficient documentation

## 2010-05-15 DIAGNOSIS — Z5189 Encounter for other specified aftercare: Secondary | ICD-10-CM | POA: Insufficient documentation

## 2010-05-15 DIAGNOSIS — I2510 Atherosclerotic heart disease of native coronary artery without angina pectoris: Secondary | ICD-10-CM | POA: Insufficient documentation

## 2010-05-15 NOTE — Progress Notes (Signed)
Summary: Swelling at incision site of CABG  Phone Note Call from Patient Call back at [phone]   Caller: Patient Reason for Call: Talk to Nurse Summary of Call: patient had CABG on 12/9 / states that he is swolen at the top of his incision site / would ilke to come in and have it checked / tg Initial call taken by: Raechel Ache [facility],  May 07, 2010 8:10 AM  Follow-up for Phone Call        pt was instructed to call the surgeon that did his surgery.  He has not been released from Dr. Sharee Pimple surgical care. Follow-up by: Teressa Lower RN,  May 07, 2010 9:34 AM

## 2010-05-16 ENCOUNTER — Encounter: Payer: Self-pay | Admitting: Cardiology

## 2010-05-16 ENCOUNTER — Encounter (INDEPENDENT_AMBULATORY_CARE_PROVIDER_SITE_OTHER): Payer: PRIVATE HEALTH INSURANCE

## 2010-05-16 DIAGNOSIS — I2699 Other pulmonary embolism without acute cor pulmonale: Secondary | ICD-10-CM

## 2010-05-16 DIAGNOSIS — Z7901 Long term (current) use of anticoagulants: Secondary | ICD-10-CM

## 2010-05-16 LAB — CONVERTED CEMR LAB: POC INR: 2.8

## 2010-05-17 ENCOUNTER — Ambulatory Visit (HOSPITAL_COMMUNITY): Payer: PRIVATE HEALTH INSURANCE

## 2010-05-20 ENCOUNTER — Ambulatory Visit (HOSPITAL_COMMUNITY): Payer: PRIVATE HEALTH INSURANCE | Attending: Cardiology

## 2010-05-20 DIAGNOSIS — Z951 Presence of aortocoronary bypass graft: Secondary | ICD-10-CM | POA: Insufficient documentation

## 2010-05-20 DIAGNOSIS — I2510 Atherosclerotic heart disease of native coronary artery without angina pectoris: Secondary | ICD-10-CM | POA: Insufficient documentation

## 2010-05-20 DIAGNOSIS — I252 Old myocardial infarction: Secondary | ICD-10-CM | POA: Insufficient documentation

## 2010-05-20 DIAGNOSIS — Z5189 Encounter for other specified aftercare: Secondary | ICD-10-CM | POA: Insufficient documentation

## 2010-05-22 ENCOUNTER — Ambulatory Visit (HOSPITAL_COMMUNITY): Payer: PRIVATE HEALTH INSURANCE | Attending: Cardiology

## 2010-05-22 DIAGNOSIS — I252 Old myocardial infarction: Secondary | ICD-10-CM | POA: Insufficient documentation

## 2010-05-22 DIAGNOSIS — I2510 Atherosclerotic heart disease of native coronary artery without angina pectoris: Secondary | ICD-10-CM | POA: Insufficient documentation

## 2010-05-22 DIAGNOSIS — Z951 Presence of aortocoronary bypass graft: Secondary | ICD-10-CM | POA: Insufficient documentation

## 2010-05-22 DIAGNOSIS — Z5189 Encounter for other specified aftercare: Secondary | ICD-10-CM | POA: Insufficient documentation

## 2010-05-23 NOTE — Medication Information (Signed)
Summary: ccr-lr  Anticoagulant Therapy Managed by: Vashti Hey, RN Patient Assessment Part 2:  Have you MISSED ANY DOSES or CHANGED TABLETS?  0  Have you had any BRUISING or BLEEDING ( nose or gum bleeds,blood in urine or stool)?  Have you STARTED or STOPPED any MEDICATIONS, including OTC meds,herbals or supplements?  Have you CHANGED your DIET, especially green vegetables,or ALCOHOL intake?  Have you had any ILLNESSES or HOSPITALIZATIONS?  Have you had any signs of CLOTTING?(chest discomfort,dizziness,shortness of breath,arms tingling,slurred speech,swelling or redness in leg)       Regimen Out:    Total Weekly: 22.50 mg mg  Next INR Due: 06/03/2010      Allergies: 1)  ! * Lisinopril  Anticoagulant Therapy  Managed by: Vashti Hey, RN PCP: [name] Supervising MD: Diona Browner MD, Remi Deter Indication 1: Pulmonary Embolism Lab Used: [facility] [place] Site: [place] INR POC 2.8  Dietary changes: no    Health status changes: no    Bleeding/hemorrhagic complications: no    Recent/future hospitalizations: no    Any changes in medication regimen? no    Recent/future dental: no  Any missed doses?: no       Is patient compliant with meds? yes         Anticoagulation Management History:      The patient is taking warfarin and comes in today for a routine follow up visit.  Anticoagulation is being administered due to Bilateral Pulmonary Emboli.  Negative risk factors for bleeding include an age less than 55 years old, no history of CVA/TIA, no history of GI bleeding, and absence of serious comorbidities.  The bleeding index is 'low risk'.  Positive CHADS2 values include History of HTN.  Negative CHADS2 values include History of CHF, Age > 28 years old, History of Diabetes, and Prior Stroke/CVA/TIA.  The start date was 04/09/2010.  Anticoagulation responsible provider: Diona Browner MD, Remi Deter.  INR POC: 2.8.  Cuvette Lot#: [id number].    Anticoagulation  Management Assessment/Plan:      The patient's current anticoagulation dose is Warfarin sodium 5 mg tabs: m,w,fri 2.5mg  all otherdays he takes 5.  The target INR is 2.0-3.0.  The next INR is due 06/03/2010.  Anticoagulation instructions were given to patient.  Results were reviewed/authorized by Vashti Hey, RN.         Prior Anticoagulation Instructions: INR 4.3 Hold coumadin tonight then decrease dose to 2.5mg  once daily except 5mg  on Tuesdays and Saturdays  Current Anticoagulation Instructions: INR 2.8 Continue coumadin 2.5mg  once daily except 5mg  on Tuesdays and Saturdays

## 2010-05-24 ENCOUNTER — Encounter (HOSPITAL_COMMUNITY): Payer: PRIVATE HEALTH INSURANCE

## 2010-05-27 ENCOUNTER — Ambulatory Visit (HOSPITAL_COMMUNITY): Payer: PRIVATE HEALTH INSURANCE

## 2010-05-29 ENCOUNTER — Encounter: Payer: Self-pay | Admitting: Adult Health

## 2010-05-29 ENCOUNTER — Ambulatory Visit (INDEPENDENT_AMBULATORY_CARE_PROVIDER_SITE_OTHER): Payer: PRIVATE HEALTH INSURANCE | Admitting: Adult Health

## 2010-05-29 ENCOUNTER — Ambulatory Visit (HOSPITAL_COMMUNITY): Payer: PRIVATE HEALTH INSURANCE

## 2010-05-29 DIAGNOSIS — I2510 Atherosclerotic heart disease of native coronary artery without angina pectoris: Secondary | ICD-10-CM

## 2010-05-29 DIAGNOSIS — I2699 Other pulmonary embolism without acute cor pulmonale: Secondary | ICD-10-CM

## 2010-05-31 ENCOUNTER — Ambulatory Visit (HOSPITAL_COMMUNITY): Payer: PRIVATE HEALTH INSURANCE

## 2010-06-03 ENCOUNTER — Ambulatory Visit (HOSPITAL_COMMUNITY): Payer: PRIVATE HEALTH INSURANCE

## 2010-06-03 ENCOUNTER — Encounter (INDEPENDENT_AMBULATORY_CARE_PROVIDER_SITE_OTHER): Payer: PRIVATE HEALTH INSURANCE

## 2010-06-03 ENCOUNTER — Encounter: Payer: Self-pay | Admitting: Cardiology

## 2010-06-03 DIAGNOSIS — Z7901 Long term (current) use of anticoagulants: Secondary | ICD-10-CM

## 2010-06-03 DIAGNOSIS — I2699 Other pulmonary embolism without acute cor pulmonale: Secondary | ICD-10-CM

## 2010-06-03 LAB — CONVERTED CEMR LAB: POC INR: 3.2

## 2010-06-04 NOTE — Assessment & Plan Note (Signed)
Summary: pt having pain at CABG incision and right side/tg   Visit Type:  Follow-up Primary Provider:  [name]  CC:  patient wants chest incision checked.  History of Present Illness: [name] is a 65 y/o AAM we are seeing on follow post CABG in the setting of severe 3 vessel CAD with complications of Bilateral pulmonary emboli 1 week later after that discharge.  Mr. [name] had CABG  on Mar 18, 2010 (LIMA-LAD, SVG-Diagonal, and SVG to OM1.  He was found to have PE when after passing a constipated stool he became very weak and diaphoretic.  He was readmitted on 03/24/2010 with low 02 sat and CT demonstrated bilateral PE.  He was treated with heparin and has since been placed on coumadin where he is followed by the [facility] office. Last visit 05/16/2010 INR 2.8  He has seen and been released by Dr. Sharee Pimple office on his follow-up appointment Jan 9,2012. He was doing well from their standpoint.  Since that time he continues to have incisional pain at the proximal and distal sternotomy sites.  He takes tramadol for this.  The pain is improved slowly over time.  He would like to have his incision checked today.  Current Medications (verified): 1)  Aspir-Low 81 Mg Tbec (Aspirin) .... Take 1 Tab Daily 2)  Metoprolol Succinate 100 Mg Xr24h-Tab (Metoprolol Succinate) .... Take 1 3/4 Tabs Daily 3)  Simvastatin 40 Mg Tabs (Simvastatin) .... Take 1 Tab Daily 4)  Nitrostat 0.4 Mg Subl (Nitroglycerin) .Marland Kitchen.. 1 Tablet Under Tongue At Onset of Chest Pain; You May Repeat Every 5 Minutes For Up To 3 Doses. 5)  Acetaminophen 325 Mg Tabs (Acetaminophen) .... Take As Needed 6)  Tramadol Hcl 50 Mg Tabs (Tramadol Hcl) .... Use As Directed For Pain 7)  Warfarin Sodium 5 Mg Tabs (Warfarin Sodium) .... M,w,fri 2.5mg  All Otherdays He Takes 5 8)  Colace 100 Mg Caps (Docusate Sodium) .... Take As Needed  Allergies (verified): 1)  ! * Lisinopril  Review of Systems       Incision pain.  All other systems have been  reviewed and are negative unless stated above.   Vital Signs:  Patient profile:   65 year old male Weight:      202 pounds BMI:     32.72 Pulse rate:   77 / minute BP sitting:   144 / 78  (left arm)  Vitals Entered By: Dreama Saa, CNA (May 29, 2010 2:36 PM)  Physical Exam  General:  Well developed, well nourished, in no acute distress. Lungs:  Clear bilaterally to auscultation and percussion. Heart:  Non-displaced PMI, chest non-tender; regular rate and rhythm, S1, S2 without murmurs, rubs or gallops. Carotid upstroke normal, no bruit. Normal abdominal aortic size, no bruits. Femorals normal pulses, no bruits. Pedals normal pulses. No edema, no varicosities. Abdomen:  Bowel sounds positive; abdomen soft and non-tender without masses, organomegaly, or hernias noted. No hepatosplenomegaly. Msk:  Back normal, normal gait. Muscle strength and tone normal. Skin:  Sternotomy site is well healed. He has some keloid scarring noted in the distal and proximal areas.  He is tender to the touch but not severely so.   Psych:  Normal affect.   Impression & Recommendations:  Problem # 1:  CAD (ICD-414.00) He is doing well from a CV perspective. No recurrent chest pain. He continues to be active. The sternotomy site is well healed but tender in places.  He is given reassurance concerning healing time, as it may  take as long as a year to completely be pain free on incision. He takes the tramadol as directed.  He can take occasional ibuprofen if necessary. His updated medication list for this problem includes:    Aspir-low 81 Mg Tbec (Aspirin) .Marland Kitchen... Take 1 tab daily    Metoprolol Succinate 100 Mg Xr24h-tab (Metoprolol succinate) .Marland Kitchen... Take 1 3/4 tabs daily    Nitrostat 0.4 Mg Subl (Nitroglycerin) .Marland Kitchen... 1 tablet under tongue at onset of chest pain; you may repeat every 5 minutes for up to 3 doses.    Warfarin Sodium 5 Mg Tabs (Warfarin sodium) ..... M,w,fri 2.5mg  all otherdays he takes  5  Problem # 2:  PULMONARY EMBOLISM (ICD-415.19) Continues compliant on his medication.  Follows up with coumadin clinic and is without complaint of bleeding issues. His updated medication list for this problem includes:    Aspir-low 81 Mg Tbec (Aspirin) .Marland Kitchen... Take 1 tab daily    Warfarin Sodium 5 Mg Tabs (Warfarin sodium) ..... M,w,fri 2.5mg  all otherdays he takes 5  Patient Instructions: 1)  Your physician recommends that you schedule a follow-up appointment in: 6 months 2)  Your physician recommends that you continue on your current medications as directed. Please refer to the Current Medication list given to you today.

## 2010-06-05 ENCOUNTER — Ambulatory Visit (HOSPITAL_COMMUNITY): Payer: PRIVATE HEALTH INSURANCE

## 2010-06-07 ENCOUNTER — Ambulatory Visit (HOSPITAL_COMMUNITY): Payer: PRIVATE HEALTH INSURANCE | Attending: Cardiology

## 2010-06-07 DIAGNOSIS — I252 Old myocardial infarction: Secondary | ICD-10-CM | POA: Insufficient documentation

## 2010-06-07 DIAGNOSIS — I2510 Atherosclerotic heart disease of native coronary artery without angina pectoris: Secondary | ICD-10-CM | POA: Insufficient documentation

## 2010-06-07 DIAGNOSIS — Z5189 Encounter for other specified aftercare: Secondary | ICD-10-CM | POA: Insufficient documentation

## 2010-06-07 DIAGNOSIS — Z951 Presence of aortocoronary bypass graft: Secondary | ICD-10-CM | POA: Insufficient documentation

## 2010-06-10 ENCOUNTER — Telehealth (INDEPENDENT_AMBULATORY_CARE_PROVIDER_SITE_OTHER): Payer: Self-pay

## 2010-06-10 ENCOUNTER — Ambulatory Visit (HOSPITAL_COMMUNITY): Payer: PRIVATE HEALTH INSURANCE

## 2010-06-12 ENCOUNTER — Ambulatory Visit (HOSPITAL_COMMUNITY): Payer: PRIVATE HEALTH INSURANCE

## 2010-06-13 NOTE — Medication Information (Signed)
Summary: ccr-lr  Anticoagulant Therapy  Managed by: Vashti Hey, RN PCP: [name] Supervising MD: Diona Browner MD, Remi Deter Indication 1: Pulmonary Embolism Lab Used: [facility] [place] Site:  INR POC 3.2  Dietary changes: no    Health status changes: no    Bleeding/hemorrhagic complications: no    Recent/future hospitalizations: no    Any changes in medication regimen? no    Recent/future dental: no  Any missed doses?: no       Is patient compliant with meds? yes       Allergies: 1)  ! * Lisinopril  Anticoagulation Management History:      The patient is taking warfarin and comes in today for a routine follow up visit.  Anticoagulation is being administered due to Bilateral Pulmonary Emboli.  Negative risk factors for bleeding include an age less than 78 years old, no history of CVA/TIA, no history of GI bleeding, and absence of serious comorbidities.  The bleeding index is 'low risk'.  Positive CHADS2 values include History of HTN.  Negative CHADS2 values include History of CHF, Age > 44 years old, History of Diabetes, and Prior Stroke/CVA/TIA.  The start date was 04/09/2010.  Anticoagulation responsible provider: Diona Browner MD, Remi Deter.  INR POC: 3.2.  Cuvette Lot#: [id number].    Anticoagulation Management Assessment/Plan:      The patient's current anticoagulation dose is Warfarin sodium 5 mg tabs: m,w,fri 2.5mg  all otherdays he takes 5.  The target INR is 2.0-3.0.  The next INR is due 06/24/2010.  Anticoagulation instructions were given to patient.  Results were reviewed/authorized by Vashti Hey, RN.  He was notified by Vashti Hey RN.         Prior Anticoagulation Instructions: INR 2.8 Continue coumadin 2.5mg  once daily except 5mg  on Tuesdays and Saturdays  Current Anticoagulation Instructions: INR 3.2 Decrease coumadin to 2.5mg  once daily except 5mg  on Saturdays

## 2010-06-14 ENCOUNTER — Ambulatory Visit (HOSPITAL_COMMUNITY): Payer: PRIVATE HEALTH INSURANCE

## 2010-06-17 ENCOUNTER — Ambulatory Visit (HOSPITAL_COMMUNITY): Payer: PRIVATE HEALTH INSURANCE

## 2010-06-17 LAB — BLOOD GAS, ARTERIAL
Acid-Base Excess: 0.7 mmol/L (ref 0.0–2.0)
Acid-Base Excess: 1 mmol/L (ref 0.0–2.0)
Acid-base deficit: 2.8 mmol/L — ABNORMAL HIGH (ref 0.0–2.0)
Bicarbonate: 20.6 meq/L (ref 20.0–24.0)
Bicarbonate: 25.2 mEq/L — ABNORMAL HIGH (ref 20.0–24.0)
Bicarbonate: 25.5 mEq/L — ABNORMAL HIGH (ref 20.0–24.0)
Drawn by: 257081
FIO2: 0.21 %
FIO2: 1 %
O2 Content: 4 L/min
O2 Saturation: 90.1 %
O2 Saturation: 93.3 %
O2 Saturation: 94.9 %
Patient temperature: 37
Patient temperature: 98.6
Patient temperature: 98.6
TCO2: 18.5 mmol/L (ref 0–100)
TCO2: 26.4 mmol/L (ref 0–100)
TCO2: 26.9 mmol/L (ref 0–100)
pCO2 arterial: 30.6 mmHg — ABNORMAL LOW (ref 35.0–45.0)
pCO2 arterial: 40.4 mmHg (ref 35.0–45.0)
pCO2 arterial: 46.4 mmHg — ABNORMAL HIGH (ref 35.0–45.0)
pH, Arterial: 7.443 (ref 7.350–7.450)
pH, Arterial: 7.359 (ref 7.350–7.450)
pH, Arterial: 7.411 (ref 7.350–7.450)
pO2, Arterial: 62.1 mmHg — ABNORMAL LOW (ref 80.0–100.0)
pO2, Arterial: 68.6 mmHg — ABNORMAL LOW (ref 80.0–100.0)
pO2, Arterial: 78.3 mmHg — ABNORMAL LOW (ref 80.0–100.0)

## 2010-06-17 LAB — BASIC METABOLIC PANEL
BUN: 14 mg/dL (ref 6–23)
BUN: 14 mg/dL (ref 6–23)
BUN: 14 mg/dL (ref 6–23)
BUN: 16 mg/dL (ref 6–23)
BUN: 17 mg/dL (ref 6–23)
BUN: 18 mg/dL (ref 6–23)
BUN: 21 mg/dL (ref 6–23)
BUN: 9 mg/dL (ref 6–23)
BUN: 11 mg/dL (ref 6–23)
BUN: 11 mg/dL (ref 6–23)
BUN: 9 mg/dL (ref 6–23)
CO2: 21 mEq/L (ref 19–32)
CO2: 24 mEq/L (ref 19–32)
CO2: 24 mEq/L (ref 19–32)
CO2: 24 mEq/L (ref 19–32)
CO2: 24 mEq/L (ref 19–32)
CO2: 26 mEq/L (ref 19–32)
CO2: 31 mEq/L (ref 19–32)
CO2: 28 mEq/L (ref 19–32)
CO2: 24 mEq/L (ref 19–32)
CO2: 29 mEq/L (ref 19–32)
CO2: 30 mEq/L (ref 19–32)
Calcium: 8.5 mg/dL (ref 8.4–10.5)
Calcium: 8.5 mg/dL (ref 8.4–10.5)
Calcium: 8.6 mg/dL (ref 8.4–10.5)
Calcium: 8.6 mg/dL (ref 8.4–10.5)
Calcium: 8.8 mg/dL (ref 8.4–10.5)
Calcium: 8.8 mg/dL (ref 8.4–10.5)
Calcium: 9 mg/dL (ref 8.4–10.5)
Calcium: 9 mg/dL (ref 8.4–10.5)
Calcium: 7.4 mg/dL — ABNORMAL LOW (ref 8.4–10.5)
Calcium: 8.2 mg/dL — ABNORMAL LOW (ref 8.4–10.5)
Calcium: 8.9 mg/dL (ref 8.4–10.5)
Chloride: 101 mEq/L (ref 96–112)
Chloride: 102 mEq/L (ref 96–112)
Chloride: 103 mEq/L (ref 96–112)
Chloride: 103 mEq/L (ref 96–112)
Chloride: 104 mEq/L (ref 96–112)
Chloride: 105 mEq/L (ref 96–112)
Chloride: 105 mEq/L (ref 96–112)
Chloride: 105 mEq/L (ref 96–112)
Chloride: 103 mEq/L (ref 96–112)
Chloride: 105 mEq/L (ref 96–112)
Chloride: 109 mEq/L (ref 96–112)
Creatinine, Ser: 0.98 mg/dL (ref 0.4–1.5)
Creatinine, Ser: 1 mg/dL (ref 0.4–1.5)
Creatinine, Ser: 1.07 mg/dL (ref 0.4–1.5)
Creatinine, Ser: 1.45 mg/dL (ref 0.4–1.5)
Creatinine, Ser: 1.49 mg/dL (ref 0.4–1.5)
Creatinine, Ser: 1.62 mg/dL — ABNORMAL HIGH (ref 0.4–1.5)
Creatinine, Ser: 1.66 mg/dL — ABNORMAL HIGH (ref 0.4–1.5)
Creatinine, Ser: 1.28 mg/dL (ref 0.4–1.5)
Creatinine, Ser: 1.27 mg/dL (ref 0.4–1.5)
Creatinine, Ser: 1.28 mg/dL (ref 0.4–1.5)
Creatinine, Ser: 1.3 mg/dL (ref 0.4–1.5)
GFR calc Af Amer: 51 mL/min — ABNORMAL LOW (ref >60)
GFR calc Af Amer: 52 mL/min — ABNORMAL LOW (ref >60)
GFR calc Af Amer: 58 mL/min — ABNORMAL LOW (ref >60)
GFR calc Af Amer: 59 mL/min — ABNORMAL LOW (ref >60)
GFR calc Af Amer: >60 mL/min (ref >60)
GFR calc Af Amer: >60 mL/min (ref >60)
GFR calc Af Amer: >60 mL/min (ref >60)
GFR calc Af Amer: >60 mL/min (ref >60)
GFR calc Af Amer: >60 mL/min (ref >60)
GFR calc Af Amer: >60 mL/min (ref >60)
GFR calc Af Amer: >60 mL/min (ref >60)
GFR calc non Af Amer: 42 mL/min — ABNORMAL LOW (ref >60)
GFR calc non Af Amer: 43 mL/min — ABNORMAL LOW (ref >60)
GFR calc non Af Amer: 48 mL/min — ABNORMAL LOW (ref >60)
GFR calc non Af Amer: 49 mL/min — ABNORMAL LOW (ref >60)
GFR calc non Af Amer: >60 mL/min (ref >60)
GFR calc non Af Amer: >60 mL/min (ref >60)
GFR calc non Af Amer: >60 mL/min (ref >60)
GFR calc non Af Amer: 57 mL/min — ABNORMAL LOW (ref >60)
GFR calc non Af Amer: 56 mL/min — ABNORMAL LOW (ref >60)
GFR calc non Af Amer: 57 mL/min — ABNORMAL LOW (ref >60)
GFR calc non Af Amer: 57 mL/min — ABNORMAL LOW (ref >60)
Glucose, Bld: 101 mg/dL — ABNORMAL HIGH (ref 70–99)
Glucose, Bld: 103 mg/dL — ABNORMAL HIGH (ref 70–99)
Glucose, Bld: 104 mg/dL — ABNORMAL HIGH (ref 70–99)
Glucose, Bld: 109 mg/dL — ABNORMAL HIGH (ref 70–99)
Glucose, Bld: 124 mg/dL — ABNORMAL HIGH (ref 70–99)
Glucose, Bld: 173 mg/dL — ABNORMAL HIGH (ref 70–99)
Glucose, Bld: 99 mg/dL (ref 70–99)
Glucose, Bld: 131 mg/dL — ABNORMAL HIGH (ref 70–99)
Glucose, Bld: 115 mg/dL — ABNORMAL HIGH (ref 70–99)
Glucose, Bld: 137 mg/dL — ABNORMAL HIGH (ref 70–99)
Glucose, Bld: 97 mg/dL (ref 70–99)
Potassium: 3.5 mEq/L (ref 3.5–5.1)
Potassium: 3.9 mEq/L (ref 3.5–5.1)
Potassium: 3.9 mEq/L (ref 3.5–5.1)
Potassium: 4 mEq/L (ref 3.5–5.1)
Potassium: 4.2 mEq/L (ref 3.5–5.1)
Potassium: 4.2 mEq/L (ref 3.5–5.1)
Potassium: 4.3 mEq/L (ref 3.5–5.1)
Potassium: 3.9 mEq/L (ref 3.5–5.1)
Potassium: 4.1 mEq/L (ref 3.5–5.1)
Potassium: 4.1 mEq/L (ref 3.5–5.1)
Potassium: 4.3 mEq/L (ref 3.5–5.1)
Sodium: 135 mEq/L (ref 135–145)
Sodium: 136 mEq/L (ref 135–145)
Sodium: 136 mEq/L (ref 135–145)
Sodium: 137 mEq/L (ref 135–145)
Sodium: 137 mEq/L (ref 135–145)
Sodium: 137 mEq/L (ref 135–145)
Sodium: 139 mEq/L (ref 135–145)
Sodium: 139 mEq/L (ref 135–145)
Sodium: 137 mEq/L (ref 135–145)
Sodium: 138 mEq/L (ref 135–145)
Sodium: 141 mEq/L (ref 135–145)

## 2010-06-17 LAB — CBC
HCT: 33.4 % — ABNORMAL LOW (ref 39.0–52.0)
HCT: 33.7 % — ABNORMAL LOW (ref 39.0–52.0)
HCT: 34 % — ABNORMAL LOW (ref 39.0–52.0)
HCT: 34.7 % — ABNORMAL LOW (ref 39.0–52.0)
HCT: 36.2 % — ABNORMAL LOW (ref 39.0–52.0)
HCT: 36.3 % — ABNORMAL LOW (ref 39.0–52.0)
HCT: 36.3 % — ABNORMAL LOW (ref 39.0–52.0)
HCT: 36.8 % — ABNORMAL LOW (ref 39.0–52.0)
HCT: 36.9 % — ABNORMAL LOW (ref 39.0–52.0)
HCT: 36.9 % — ABNORMAL LOW (ref 39.0–52.0)
HCT: 36.9 % — ABNORMAL LOW (ref 39.0–52.0)
HCT: 38.8 % — ABNORMAL LOW (ref 39.0–52.0)
HCT: 39.4 % (ref 39.0–52.0)
HCT: 42.8 % (ref 39.0–52.0)
HCT: 36 % — ABNORMAL LOW (ref 39.0–52.0)
HCT: 36 % — ABNORMAL LOW (ref 39.0–52.0)
HCT: 36.4 % — ABNORMAL LOW (ref 39.0–52.0)
HCT: 36.5 % — ABNORMAL LOW (ref 39.0–52.0)
HCT: 36.9 % — ABNORMAL LOW (ref 39.0–52.0)
HCT: 45.3 % (ref 39.0–52.0)
HCT: 47.2 % (ref 39.0–52.0)
Hemoglobin: 10.8 g/dL — ABNORMAL LOW (ref 13.0–17.0)
Hemoglobin: 10.9 g/dL — ABNORMAL LOW (ref 13.0–17.0)
Hemoglobin: 10.9 g/dL — ABNORMAL LOW (ref 13.0–17.0)
Hemoglobin: 11.1 g/dL — ABNORMAL LOW (ref 13.0–17.0)
Hemoglobin: 11.4 g/dL — ABNORMAL LOW (ref 13.0–17.0)
Hemoglobin: 11.5 g/dL — ABNORMAL LOW (ref 13.0–17.0)
Hemoglobin: 11.5 g/dL — ABNORMAL LOW (ref 13.0–17.0)
Hemoglobin: 11.5 g/dL — ABNORMAL LOW (ref 13.0–17.0)
Hemoglobin: 11.9 g/dL — ABNORMAL LOW (ref 13.0–17.0)
Hemoglobin: 11.9 g/dL — ABNORMAL LOW (ref 13.0–17.0)
Hemoglobin: 12.1 g/dL — ABNORMAL LOW (ref 13.0–17.0)
Hemoglobin: 12.4 g/dL — ABNORMAL LOW (ref 13.0–17.0)
Hemoglobin: 12.8 g/dL — ABNORMAL LOW (ref 13.0–17.0)
Hemoglobin: 14.7 g/dL (ref 13.0–17.0)
Hemoglobin: 11.6 g/dL — ABNORMAL LOW (ref 13.0–17.0)
Hemoglobin: 11.7 g/dL — ABNORMAL LOW (ref 13.0–17.0)
Hemoglobin: 11.8 g/dL — ABNORMAL LOW (ref 13.0–17.0)
Hemoglobin: 11.9 g/dL — ABNORMAL LOW (ref 13.0–17.0)
Hemoglobin: 12.1 g/dL — ABNORMAL LOW (ref 13.0–17.0)
Hemoglobin: 14.6 g/dL (ref 13.0–17.0)
Hemoglobin: 15.5 g/dL (ref 13.0–17.0)
MCH: 26.5 pg (ref 26.0–34.0)
MCH: 26.8 pg (ref 26.0–34.0)
MCH: 27.1 pg (ref 26.0–34.0)
MCH: 27.2 pg (ref 26.0–34.0)
MCH: 27.3 pg (ref 26.0–34.0)
MCH: 27.8 pg (ref 26.0–34.0)
MCH: 27.8 pg (ref 26.0–34.0)
MCH: 27.9 pg (ref 26.0–34.0)
MCH: 27.9 pg (ref 26.0–34.0)
MCH: 28.2 pg (ref 26.0–34.0)
MCH: 28.5 pg (ref 26.0–34.0)
MCH: 28.6 pg (ref 26.0–34.0)
MCH: 29.7 pg (ref 26.0–34.0)
MCH: 29.6 pg (ref 26.0–34.0)
MCH: 28.4 pg (ref 26.0–34.0)
MCH: 28.4 pg (ref 26.0–34.0)
MCH: 28.6 pg (ref 26.0–34.0)
MCH: 29 pg (ref 26.0–34.0)
MCH: 29.2 pg (ref 26.0–34.0)
MCH: 29.2 pg (ref 26.0–34.0)
MCH: 29.2 pg (ref 26.0–34.0)
MCHC: 30.7 g/dL (ref 30.0–36.0)
MCHC: 30.9 g/dL (ref 30.0–36.0)
MCHC: 31.2 g/dL (ref 30.0–36.0)
MCHC: 31.7 g/dL (ref 30.0–36.0)
MCHC: 31.7 g/dL (ref 30.0–36.0)
MCHC: 32 g/dL (ref 30.0–36.0)
MCHC: 32 g/dL (ref 30.0–36.0)
MCHC: 32.1 g/dL (ref 30.0–36.0)
MCHC: 32.3 g/dL (ref 30.0–36.0)
MCHC: 32.5 g/dL (ref 30.0–36.0)
MCHC: 32.6 g/dL (ref 30.0–36.0)
MCHC: 32.8 g/dL (ref 30.0–36.0)
MCHC: 34.3 g/dL (ref 30.0–36.0)
MCHC: 34.3 g/dL (ref 30.0–36.0)
MCHC: 32 g/dL (ref 30.0–36.0)
MCHC: 32.1 g/dL (ref 30.0–36.0)
MCHC: 32.2 g/dL (ref 30.0–36.0)
MCHC: 32.2 g/dL (ref 30.0–36.0)
MCHC: 32.8 g/dL (ref 30.0–36.0)
MCHC: 33.1 g/dL (ref 30.0–36.0)
MCHC: 33.2 g/dL (ref 30.0–36.0)
MCV: 85.6 fL (ref 78.0–100.0)
MCV: 86 fL (ref 78.0–100.0)
MCV: 86 fL (ref 78.0–100.0)
MCV: 86.2 fL (ref 78.0–100.0)
MCV: 86.4 fL (ref 78.0–100.0)
MCV: 86.6 fL (ref 78.0–100.0)
MCV: 86.6 fL (ref 78.0–100.0)
MCV: 86.9 fL (ref 78.0–100.0)
MCV: 87 fL (ref 78.0–100.0)
MCV: 87 fL (ref 78.0–100.0)
MCV: 87.2 fL (ref 78.0–100.0)
MCV: 87.7 fL (ref 78.0–100.0)
MCV: 88.2 fL (ref 78.0–100.0)
MCV: 86.3 fL (ref 78.0–100.0)
MCV: 88 fL (ref 78.0–100.0)
MCV: 88 fL (ref 78.0–100.0)
MCV: 88.5 fL (ref 78.0–100.0)
MCV: 88.6 fL (ref 78.0–100.0)
MCV: 88.9 fL (ref 78.0–100.0)
MCV: 89.1 fL (ref 78.0–100.0)
MCV: 90.1 fL (ref 78.0–100.0)
Platelets: 120 10*3/uL — ABNORMAL LOW (ref 150–400)
Platelets: 171 10*3/uL (ref 150–400)
Platelets: 271 10*3/uL (ref 150–400)
Platelets: 274 10*3/uL (ref 150–400)
Platelets: 276 10*3/uL (ref 150–400)
Platelets: 285 10*3/uL (ref 150–400)
Platelets: 291 10*3/uL (ref 150–400)
Platelets: 292 10*3/uL (ref 150–400)
Platelets: 293 10*3/uL (ref 150–400)
Platelets: 302 10*3/uL (ref 150–400)
Platelets: 302 K/uL (ref 150–400)
Platelets: 303 10*3/uL (ref 150–400)
Platelets: 310 10*3/uL (ref 150–400)
Platelets: 150 10*3/uL (ref 150–400)
Platelets: 105 10*3/uL — ABNORMAL LOW (ref 150–400)
Platelets: 109 10*3/uL — ABNORMAL LOW (ref 150–400)
Platelets: 111 10*3/uL — ABNORMAL LOW (ref 150–400)
Platelets: 112 10*3/uL — ABNORMAL LOW (ref 150–400)
Platelets: 118 10*3/uL — ABNORMAL LOW (ref 150–400)
Platelets: 143 10*3/uL — ABNORMAL LOW (ref 150–400)
Platelets: 172 10*3/uL (ref 150–400)
RBC: 3.81 MIL/uL — ABNORMAL LOW (ref 4.22–5.81)
RBC: 3.88 MIL/uL — ABNORMAL LOW (ref 4.22–5.81)
RBC: 3.91 MIL/uL — ABNORMAL LOW (ref 4.22–5.81)
RBC: 3.99 MIL/uL — ABNORMAL LOW (ref 4.22–5.81)
RBC: 4.17 MIL/uL — ABNORMAL LOW (ref 4.22–5.81)
RBC: 4.18 MIL/uL — ABNORMAL LOW (ref 4.22–5.81)
RBC: 4.21 MIL/uL — ABNORMAL LOW (ref 4.22–5.81)
RBC: 4.23 MIL/uL (ref 4.22–5.81)
RBC: 4.24 MIL/uL (ref 4.22–5.81)
RBC: 4.26 MIL/uL (ref 4.22–5.81)
RBC: 4.29 MIL/uL (ref 4.22–5.81)
RBC: 4.49 MIL/uL (ref 4.22–5.81)
RBC: 4.58 MIL/uL (ref 4.22–5.81)
RBC: 4.96 MIL/uL (ref 4.22–5.81)
RBC: 4.04 MIL/uL — ABNORMAL LOW (ref 4.22–5.81)
RBC: 4.07 MIL/uL — ABNORMAL LOW (ref 4.22–5.81)
RBC: 4.09 MIL/uL — ABNORMAL LOW (ref 4.22–5.81)
RBC: 4.15 MIL/uL — ABNORMAL LOW (ref 4.22–5.81)
RBC: 4.15 MIL/uL — ABNORMAL LOW (ref 4.22–5.81)
RBC: 5.11 MIL/uL (ref 4.22–5.81)
RBC: 5.3 MIL/uL (ref 4.22–5.81)
RDW: 13 % (ref 11.5–15.5)
RDW: 13.2 % (ref 11.5–15.5)
RDW: 13.4 % (ref 11.5–15.5)
RDW: 13.5 % (ref 11.5–15.5)
RDW: 13.6 % (ref 11.5–15.5)
RDW: 13.6 % (ref 11.5–15.5)
RDW: 13.7 % (ref 11.5–15.5)
RDW: 13.7 % (ref 11.5–15.5)
RDW: 13.8 % (ref 11.5–15.5)
RDW: 14 % (ref 11.5–15.5)
RDW: 14 % (ref 11.5–15.5)
RDW: 14.1 % (ref 11.5–15.5)
RDW: 14.1 % (ref 11.5–15.5)
RDW: 13.2 % (ref 11.5–15.5)
RDW: 13 % (ref 11.5–15.5)
RDW: 13.1 % (ref 11.5–15.5)
RDW: 13.1 % (ref 11.5–15.5)
RDW: 13.2 % (ref 11.5–15.5)
RDW: 13.2 % (ref 11.5–15.5)
RDW: 13.2 % (ref 11.5–15.5)
RDW: 13.3 % (ref 11.5–15.5)
WBC: 10.5 10*3/uL (ref 4.0–10.5)
WBC: 12.9 10*3/uL — ABNORMAL HIGH (ref 4.0–10.5)
WBC: 13.3 10*3/uL — ABNORMAL HIGH (ref 4.0–10.5)
WBC: 14.8 K/uL — ABNORMAL HIGH (ref 4.0–10.5)
WBC: 6 10*3/uL (ref 4.0–10.5)
WBC: 6.3 10*3/uL (ref 4.0–10.5)
WBC: 6.5 10*3/uL (ref 4.0–10.5)
WBC: 6.6 10*3/uL (ref 4.0–10.5)
WBC: 7.1 10*3/uL (ref 4.0–10.5)
WBC: 7.3 10*3/uL (ref 4.0–10.5)
WBC: 8 10*3/uL (ref 4.0–10.5)
WBC: 8.9 10*3/uL (ref 4.0–10.5)
WBC: 9.5 10*3/uL (ref 4.0–10.5)
WBC: 6.2 10*3/uL (ref 4.0–10.5)
WBC: 13.4 10*3/uL — ABNORMAL HIGH (ref 4.0–10.5)
WBC: 13.7 10*3/uL — ABNORMAL HIGH (ref 4.0–10.5)
WBC: 7 10*3/uL (ref 4.0–10.5)
WBC: 7.1 10*3/uL (ref 4.0–10.5)
WBC: 8 10*3/uL (ref 4.0–10.5)
WBC: 8.7 10*3/uL (ref 4.0–10.5)
WBC: 9.7 10*3/uL (ref 4.0–10.5)

## 2010-06-17 LAB — POCT I-STAT 3, ART BLOOD GAS (G3+)
Acid-Base Excess: 1 mmol/L (ref 0.0–2.0)
Acid-base deficit: 3 mmol/L — ABNORMAL HIGH (ref 0.0–2.0)
Acid-base deficit: 4 mmol/L — ABNORMAL HIGH (ref 0.0–2.0)
Bicarbonate: 22.4 mEq/L (ref 20.0–24.0)
Bicarbonate: 23 mEq/L (ref 20.0–24.0)
Bicarbonate: 25.6 mEq/L — ABNORMAL HIGH (ref 20.0–24.0)
Bicarbonate: 26.2 mEq/L — ABNORMAL HIGH (ref 20.0–24.0)
Bicarbonate: 27 mEq/L — ABNORMAL HIGH (ref 20.0–24.0)
O2 Saturation: 100 %
O2 Saturation: 100 %
O2 Saturation: 100 %
O2 Saturation: 91 %
O2 Saturation: 92 %
Patient temperature: 34.7
Patient temperature: 37.4
TCO2: 24 mmol/L (ref 0–100)
TCO2: 24 mmol/L (ref 0–100)
TCO2: 27 mmol/L (ref 0–100)
TCO2: 28 mmol/L (ref 0–100)
TCO2: 29 mmol/L (ref 0–100)
pCO2 arterial: 39.8 mmHg (ref 35.0–45.0)
pCO2 arterial: 40.8 mmHg (ref 35.0–45.0)
pCO2 arterial: 44.4 mmHg (ref 35.0–45.0)
pCO2 arterial: 47.9 mmHg — ABNORMAL HIGH (ref 35.0–45.0)
pCO2 arterial: 50.2 mmHg — ABNORMAL HIGH (ref 35.0–45.0)
pH, Arterial: 7.312 — ABNORMAL LOW (ref 7.350–7.450)
pH, Arterial: 7.339 — ABNORMAL LOW (ref 7.350–7.450)
pH, Arterial: 7.345 — ABNORMAL LOW (ref 7.350–7.450)
pH, Arterial: 7.359 (ref 7.350–7.450)
pH, Arterial: 7.406 (ref 7.350–7.450)
pO2, Arterial: 314 mmHg — ABNORMAL HIGH (ref 80.0–100.0)
pO2, Arterial: 358 mmHg — ABNORMAL HIGH (ref 80.0–100.0)
pO2, Arterial: 520 mmHg — ABNORMAL HIGH (ref 80.0–100.0)
pO2, Arterial: 60 mmHg — ABNORMAL LOW (ref 80.0–100.0)
pO2, Arterial: 69 mmHg — ABNORMAL LOW (ref 80.0–100.0)

## 2010-06-17 LAB — GLUCOSE, CAPILLARY
Glucose-Capillary: 107 mg/dL — ABNORMAL HIGH (ref 70–99)
Glucose-Capillary: 113 mg/dL — ABNORMAL HIGH (ref 70–99)
Glucose-Capillary: 117 mg/dL — ABNORMAL HIGH (ref 70–99)
Glucose-Capillary: 183 mg/dL — ABNORMAL HIGH (ref 70–99)
Glucose-Capillary: 96 mg/dL (ref 70–99)
Glucose-Capillary: 101 mg/dL — ABNORMAL HIGH (ref 70–99)
Glucose-Capillary: 104 mg/dL — ABNORMAL HIGH (ref 70–99)
Glucose-Capillary: 106 mg/dL — ABNORMAL HIGH (ref 70–99)
Glucose-Capillary: 113 mg/dL — ABNORMAL HIGH (ref 70–99)
Glucose-Capillary: 115 mg/dL — ABNORMAL HIGH (ref 70–99)
Glucose-Capillary: 119 mg/dL — ABNORMAL HIGH (ref 70–99)
Glucose-Capillary: 129 mg/dL — ABNORMAL HIGH (ref 70–99)
Glucose-Capillary: 130 mg/dL — ABNORMAL HIGH (ref 70–99)
Glucose-Capillary: 132 mg/dL — ABNORMAL HIGH (ref 70–99)
Glucose-Capillary: 147 mg/dL — ABNORMAL HIGH (ref 70–99)
Glucose-Capillary: 159 mg/dL — ABNORMAL HIGH (ref 70–99)
Glucose-Capillary: 172 mg/dL — ABNORMAL HIGH (ref 70–99)

## 2010-06-17 LAB — COMPREHENSIVE METABOLIC PANEL
ALT: 46 U/L (ref 0–53)
ALT: 71 U/L — ABNORMAL HIGH (ref 0–53)
ALT: 22 U/L (ref 0–53)
AST: 37 U/L (ref 0–37)
AST: 49 U/L — ABNORMAL HIGH (ref 0–37)
AST: 22 U/L (ref 0–37)
Albumin: 3 g/dL — ABNORMAL LOW (ref 3.5–5.2)
Albumin: 3.1 g/dL — ABNORMAL LOW (ref 3.5–5.2)
Albumin: 3.4 g/dL — ABNORMAL LOW (ref 3.5–5.2)
Alkaline Phosphatase: 58 U/L (ref 39–117)
Alkaline Phosphatase: 85 U/L (ref 39–117)
Alkaline Phosphatase: 44 U/L (ref 39–117)
BUN: 16 mg/dL (ref 6–23)
BUN: 20 mg/dL (ref 6–23)
BUN: 8 mg/dL (ref 6–23)
CO2: 24 mEq/L (ref 19–32)
CO2: 25 mEq/L (ref 19–32)
CO2: 28 mEq/L (ref 19–32)
Calcium: 8.7 mg/dL (ref 8.4–10.5)
Calcium: 9 mg/dL (ref 8.4–10.5)
Calcium: 8.5 mg/dL (ref 8.4–10.5)
Chloride: 103 mEq/L (ref 96–112)
Chloride: 106 mEq/L (ref 96–112)
Chloride: 108 mEq/L (ref 96–112)
Creatinine, Ser: 1.1 mg/dL (ref 0.4–1.5)
Creatinine, Ser: 1.32 mg/dL (ref 0.4–1.5)
Creatinine, Ser: 1.15 mg/dL (ref 0.4–1.5)
GFR calc Af Amer: >60 mL/min (ref >60)
GFR calc Af Amer: >60 mL/min (ref >60)
GFR calc Af Amer: >60 mL/min (ref >60)
GFR calc non Af Amer: 55 mL/min — ABNORMAL LOW (ref >60)
GFR calc non Af Amer: >60 mL/min (ref >60)
GFR calc non Af Amer: >60 mL/min (ref >60)
Glucose, Bld: 100 mg/dL — ABNORMAL HIGH (ref 70–99)
Glucose, Bld: 112 mg/dL — ABNORMAL HIGH (ref 70–99)
Glucose, Bld: 98 mg/dL (ref 70–99)
Potassium: 4.3 mEq/L (ref 3.5–5.1)
Potassium: 4.6 mEq/L (ref 3.5–5.1)
Potassium: 4 mEq/L (ref 3.5–5.1)
Sodium: 136 mEq/L (ref 135–145)
Sodium: 137 mEq/L (ref 135–145)
Sodium: 140 mEq/L (ref 135–145)
Total Bilirubin: 1 mg/dL (ref 0.3–1.2)
Total Bilirubin: 1.7 mg/dL — ABNORMAL HIGH (ref 0.3–1.2)
Total Bilirubin: 1.4 mg/dL — ABNORMAL HIGH (ref 0.3–1.2)
Total Protein: 7.3 g/dL (ref 6.0–8.3)
Total Protein: 7.7 g/dL (ref 6.0–8.3)
Total Protein: 6.6 g/dL (ref 6.0–8.3)

## 2010-06-17 LAB — POCT I-STAT 4, (NA,K, GLUC, HGB,HCT)
Glucose, Bld: 101 mg/dL — ABNORMAL HIGH (ref 70–99)
Glucose, Bld: 105 mg/dL — ABNORMAL HIGH (ref 70–99)
Glucose, Bld: 115 mg/dL — ABNORMAL HIGH (ref 70–99)
Glucose, Bld: 92 mg/dL (ref 70–99)
Glucose, Bld: 94 mg/dL (ref 70–99)
Glucose, Bld: 97 mg/dL (ref 70–99)
HCT: 32 % — ABNORMAL LOW (ref 39.0–52.0)
HCT: 33 % — ABNORMAL LOW (ref 39.0–52.0)
HCT: 35 % — ABNORMAL LOW (ref 39.0–52.0)
HCT: 37 % — ABNORMAL LOW (ref 39.0–52.0)
HCT: 45 % (ref 39.0–52.0)
HCT: 47 % (ref 39.0–52.0)
Hemoglobin: 10.9 g/dL — ABNORMAL LOW (ref 13.0–17.0)
Hemoglobin: 11.2 g/dL — ABNORMAL LOW (ref 13.0–17.0)
Hemoglobin: 11.9 g/dL — ABNORMAL LOW (ref 13.0–17.0)
Hemoglobin: 12.6 g/dL — ABNORMAL LOW (ref 13.0–17.0)
Hemoglobin: 15.3 g/dL (ref 13.0–17.0)
Hemoglobin: 16 g/dL (ref 13.0–17.0)
Potassium: 3.7 mEq/L (ref 3.5–5.1)
Potassium: 4.2 mEq/L (ref 3.5–5.1)
Potassium: 4.3 mEq/L (ref 3.5–5.1)
Potassium: 4.4 mEq/L (ref 3.5–5.1)
Potassium: 6 mEq/L — ABNORMAL HIGH (ref 3.5–5.1)
Potassium: 6.1 mEq/L — ABNORMAL HIGH (ref 3.5–5.1)
Sodium: 136 mEq/L (ref 135–145)
Sodium: 137 mEq/L (ref 135–145)
Sodium: 140 mEq/L (ref 135–145)
Sodium: 141 mEq/L (ref 135–145)
Sodium: 142 mEq/L (ref 135–145)
Sodium: 142 mEq/L (ref 135–145)

## 2010-06-17 LAB — DIFFERENTIAL
Basophils Absolute: 0.1 10*3/uL (ref 0.0–0.1)
Basophils Absolute: 0 10*3/uL (ref 0.0–0.1)
Basophils Relative: 0 % (ref 0–1)
Basophils Relative: 1 % (ref 0–1)
Eosinophils Absolute: 0 10*3/uL (ref 0.0–0.7)
Eosinophils Absolute: 0.3 10*3/uL (ref 0.0–0.7)
Eosinophils Relative: 0 % (ref 0–5)
Eosinophils Relative: 5 % (ref 0–5)
Lymphocytes Relative: 23 % (ref 12–46)
Lymphocytes Relative: 50 % — ABNORMAL HIGH (ref 12–46)
Lymphs Abs: 3.4 10*3/uL (ref 0.7–4.0)
Lymphs Abs: 3.1 10*3/uL (ref 0.7–4.0)
Monocytes Absolute: 1.1 10*3/uL — ABNORMAL HIGH (ref 0.1–1.0)
Monocytes Absolute: 0.6 10*3/uL (ref 0.1–1.0)
Monocytes Relative: 8 % (ref 3–12)
Monocytes Relative: 10 % (ref 3–12)
Neutro Abs: 10.2 10*3/uL — ABNORMAL HIGH (ref 1.7–7.7)
Neutro Abs: 2.2 10*3/uL (ref 1.7–7.7)
Neutrophils Relative %: 69 % (ref 43–77)
Neutrophils Relative %: 35 % — ABNORMAL LOW (ref 43–77)

## 2010-06-17 LAB — HEPARIN LEVEL (UNFRACTIONATED)
Heparin Unfractionated: 0.12 IU/mL — ABNORMAL LOW (ref 0.30–0.70)
Heparin Unfractionated: 0.22 IU/mL — ABNORMAL LOW (ref 0.30–0.70)
Heparin Unfractionated: 0.24 IU/mL — ABNORMAL LOW (ref 0.30–0.70)
Heparin Unfractionated: 0.27 IU/mL — ABNORMAL LOW (ref 0.30–0.70)
Heparin Unfractionated: 0.28 IU/mL — ABNORMAL LOW (ref 0.30–0.70)
Heparin Unfractionated: 0.35 IU/mL (ref 0.30–0.70)
Heparin Unfractionated: 0.37 IU/mL (ref 0.30–0.70)
Heparin Unfractionated: 0.39 IU/mL (ref 0.30–0.70)
Heparin Unfractionated: 0.39 IU/mL (ref 0.30–0.70)
Heparin Unfractionated: 0.55 IU/mL (ref 0.30–0.70)
Heparin Unfractionated: 0.6 IU/mL (ref 0.30–0.70)
Heparin Unfractionated: 0.62 IU/mL (ref 0.30–0.70)
Heparin Unfractionated: 0.66 IU/mL (ref 0.30–0.70)
Heparin Unfractionated: 0.75 IU/mL — ABNORMAL HIGH (ref 0.30–0.70)
Heparin Unfractionated: 0.76 IU/mL — ABNORMAL HIGH (ref 0.30–0.70)
Heparin Unfractionated: 0.76 IU/mL — ABNORMAL HIGH (ref 0.30–0.70)
Heparin Unfractionated: 0.83 IU/mL — ABNORMAL HIGH (ref 0.30–0.70)
Heparin Unfractionated: 0.87 IU/mL — ABNORMAL HIGH (ref 0.30–0.70)
Heparin Unfractionated: 0.9 IU/mL — ABNORMAL HIGH (ref 0.30–0.70)
Heparin Unfractionated: <0.1 IU/mL — ABNORMAL LOW (ref 0.30–0.70)

## 2010-06-17 LAB — URINALYSIS, MICROSCOPIC ONLY
Bilirubin Urine: NEGATIVE
Glucose, UA: NEGATIVE mg/dL
Hgb urine dipstick: NEGATIVE
Ketones, ur: NEGATIVE mg/dL
Leukocytes, UA: NEGATIVE
Nitrite: NEGATIVE
Protein, ur: NEGATIVE mg/dL
Specific Gravity, Urine: 1.011 (ref 1.005–1.030)
Urobilinogen, UA: 1 mg/dL (ref 0.0–1.0)
pH: 7 (ref 5.0–8.0)

## 2010-06-17 LAB — PROTIME-INR
INR: 1.16 (ref 0.00–1.49)
INR: 1.22 (ref 0.00–1.49)
INR: 1.24 (ref 0.00–1.49)
INR: 1.29 (ref 0.00–1.49)
INR: 1.4 (ref 0.00–1.49)
INR: 1.49 (ref 0.00–1.49)
INR: 1.59 — ABNORMAL HIGH (ref 0.00–1.49)
INR: 1.61 — ABNORMAL HIGH (ref 0.00–1.49)
INR: 1.86 — ABNORMAL HIGH (ref 0.00–1.49)
INR: 2.26 — ABNORMAL HIGH (ref 0.00–1.49)
INR: 2.98 — ABNORMAL HIGH (ref 0.00–1.49)
INR: 1 (ref 0.00–1.49)
INR: 1.04 (ref 0.00–1.49)
INR: 1.35 (ref 0.00–1.49)
Prothrombin Time: 15 seconds (ref 11.6–15.2)
Prothrombin Time: 15.6 seconds — ABNORMAL HIGH (ref 11.6–15.2)
Prothrombin Time: 15.8 seconds — ABNORMAL HIGH (ref 11.6–15.2)
Prothrombin Time: 16.3 seconds — ABNORMAL HIGH (ref 11.6–15.2)
Prothrombin Time: 17.4 seconds — ABNORMAL HIGH (ref 11.6–15.2)
Prothrombin Time: 18.2 seconds — ABNORMAL HIGH (ref 11.6–15.2)
Prothrombin Time: 19.1 seconds — ABNORMAL HIGH (ref 11.6–15.2)
Prothrombin Time: 19.3 seconds — ABNORMAL HIGH (ref 11.6–15.2)
Prothrombin Time: 21.6 seconds — ABNORMAL HIGH (ref 11.6–15.2)
Prothrombin Time: 25.1 seconds — ABNORMAL HIGH (ref 11.6–15.2)
Prothrombin Time: 31 seconds — ABNORMAL HIGH (ref 11.6–15.2)
Prothrombin Time: 13.4 seconds (ref 11.6–15.2)
Prothrombin Time: 13.8 seconds (ref 11.6–15.2)
Prothrombin Time: 16.9 seconds — ABNORMAL HIGH (ref 11.6–15.2)

## 2010-06-17 LAB — BASIC METABOLIC PANEL WITH GFR
BUN: 12 mg/dL (ref 6–23)
BUN: 19 mg/dL (ref 6–23)
CO2: 27 meq/L (ref 19–32)
CO2: 28 meq/L (ref 19–32)
Calcium: 8.7 mg/dL (ref 8.4–10.5)
Calcium: 9 mg/dL (ref 8.4–10.5)
Chloride: 99 meq/L (ref 96–112)
Chloride: 99 meq/L (ref 96–112)
Creatinine, Ser: 1.3 mg/dL (ref 0.4–1.5)
Creatinine, Ser: 1.48 mg/dL (ref 0.4–1.5)
GFR calc non Af Amer: 48 mL/min — ABNORMAL LOW
GFR calc non Af Amer: 56 mL/min — ABNORMAL LOW
Glucose, Bld: 112 mg/dL — ABNORMAL HIGH (ref 70–99)
Glucose, Bld: 115 mg/dL — ABNORMAL HIGH (ref 70–99)
Potassium: 3.6 meq/L (ref 3.5–5.1)
Potassium: 4.8 meq/L (ref 3.5–5.1)
Sodium: 135 meq/L (ref 135–145)
Sodium: 138 meq/L (ref 135–145)

## 2010-06-17 LAB — HEPATIC FUNCTION PANEL
ALT: 29 U/L (ref 0–53)
AST: 27 U/L (ref 0–37)
Albumin: 3.5 g/dL (ref 3.5–5.2)
Alkaline Phosphatase: 44 U/L (ref 39–117)
Bilirubin, Direct: 0.3 mg/dL (ref 0.0–0.3)
Indirect Bilirubin: 0.9 mg/dL (ref 0.3–0.9)
Total Bilirubin: 1.2 mg/dL (ref 0.3–1.2)
Total Protein: 7 g/dL (ref 6.0–8.3)

## 2010-06-17 LAB — CARDIAC PANEL(CRET KIN+CKTOT+MB+TROPI)
CK, MB: 2.8 ng/mL (ref 0.3–4.0)
CK, MB: 3 ng/mL (ref 0.3–4.0)
CK, MB: 3.1 ng/mL (ref 0.3–4.0)
CK, MB: 3.2 ng/mL (ref 0.3–4.0)
CK, MB: 3.5 ng/mL (ref 0.3–4.0)
Relative Index: 2.7 — ABNORMAL HIGH (ref 0.0–2.5)
Relative Index: 2.9 — ABNORMAL HIGH (ref 0.0–2.5)
Relative Index: INVALID (ref 0.0–2.5)
Relative Index: 0.9 (ref 0.0–2.5)
Relative Index: 0.9 (ref 0.0–2.5)
Total CK: 102 U/L (ref 7–232)
Total CK: 105 U/L (ref 7–232)
Total CK: 99 U/L (ref 7–232)
Total CK: 353 U/L — ABNORMAL HIGH (ref 7–232)
Total CK: 380 U/L — ABNORMAL HIGH (ref 7–232)
Troponin I: 1.71 ng/mL (ref 0.00–0.06)
Troponin I: 1.88 ng/mL (ref 0.00–0.06)
Troponin I: 1.92 ng/mL (ref 0.00–0.06)
Troponin I: 0.07 ng/mL — ABNORMAL HIGH (ref 0.00–0.06)
Troponin I: 0.07 ng/mL — ABNORMAL HIGH (ref 0.00–0.06)

## 2010-06-17 LAB — POCT CARDIAC MARKERS
CKMB, poc: 1.3 ng/mL (ref 1.0–8.0)
CKMB, poc: 2.2 ng/mL (ref 1.0–8.0)
CKMB, poc: 2.8 ng/mL (ref 1.0–8.0)
CKMB, poc: 1.7 ng/mL (ref 1.0–8.0)
CKMB, poc: 2.1 ng/mL (ref 1.0–8.0)
Myoglobin, poc: 144 ng/mL (ref 12–200)
Myoglobin, poc: 145 ng/mL (ref 12–200)
Myoglobin, poc: 190 ng/mL (ref 12–200)
Myoglobin, poc: 101 ng/mL (ref 12–200)
Myoglobin, poc: 151 ng/mL (ref 12–200)
Troponin i, poc: 0.18 ng/mL — ABNORMAL HIGH (ref 0.00–0.09)
Troponin i, poc: 0.3 ng/mL (ref 0.00–0.09)
Troponin i, poc: 0.34 ng/mL (ref 0.00–0.09)
Troponin i, poc: <0.05 ng/mL (ref 0.00–0.09)
Troponin i, poc: <0.05 ng/mL (ref 0.00–0.09)

## 2010-06-17 LAB — CULTURE, BLOOD (ROUTINE X 2)
Culture  Setup Time: 201112251958
Culture: NO GROWTH

## 2010-06-17 LAB — TYPE AND SCREEN
ABO/RH(D): B POS
Antibody Screen: NEGATIVE

## 2010-06-17 LAB — APTT
aPTT: 32 seconds (ref 24–37)
aPTT: 43 seconds — ABNORMAL HIGH (ref 24–37)

## 2010-06-17 LAB — MAGNESIUM
Magnesium: 2.3 mg/dL (ref 1.5–2.5)
Magnesium: 2.3 mg/dL (ref 1.5–2.5)
Magnesium: 2.3 mg/dL (ref 1.5–2.5)
Magnesium: 3 mg/dL — ABNORMAL HIGH (ref 1.5–2.5)

## 2010-06-17 LAB — URINE CULTURE
Colony Count: 10000
Culture  Setup Time: 201112161030
Special Requests: NEGATIVE

## 2010-06-17 LAB — CULTURE, RESPIRATORY W GRAM STAIN: Culture: NORMAL

## 2010-06-17 LAB — CK TOTAL AND CKMB (NOT AT ARMC)
CK, MB: 4.4 ng/mL — ABNORMAL HIGH (ref 0.3–4.0)
Relative Index: INVALID (ref 0.0–2.5)
Total CK: 90 U/L (ref 7–232)

## 2010-06-17 LAB — CREATININE, SERUM
Creatinine, Ser: 1.16 mg/dL (ref 0.4–1.5)
Creatinine, Ser: 1.3 mg/dL (ref 0.4–1.5)
GFR calc Af Amer: >60 mL/min (ref >60)
GFR calc Af Amer: >60 mL/min (ref >60)
GFR calc non Af Amer: 56 mL/min — ABNORMAL LOW (ref >60)
GFR calc non Af Amer: >60 mL/min (ref >60)

## 2010-06-17 LAB — D-DIMER, QUANTITATIVE

## 2010-06-17 LAB — LIPID PANEL
Cholesterol: 82 mg/dL (ref 0–200)
HDL: 33 mg/dL — ABNORMAL LOW (ref >39)
LDL Cholesterol: 33 mg/dL (ref 0–99)
Total CHOL/HDL Ratio: 2.5 RATIO
Triglycerides: 80 mg/dL (ref <150)
VLDL: 16 mg/dL (ref 0–40)

## 2010-06-17 LAB — HEMOGLOBIN A1C
Hgb A1c MFr Bld: 5.7 % — ABNORMAL HIGH (ref <5.7)
Mean Plasma Glucose: 117 mg/dL — ABNORMAL HIGH (ref <117)

## 2010-06-17 LAB — TROPONIN I

## 2010-06-17 LAB — POCT I-STAT, CHEM 8
BUN: 8 mg/dL (ref 6–23)
Calcium, Ion: 1.07 mmol/L — ABNORMAL LOW (ref 1.12–1.32)
Chloride: 107 mEq/L (ref 96–112)
Creatinine, Ser: 1.1 mg/dL (ref 0.4–1.5)
Glucose, Bld: 124 mg/dL — ABNORMAL HIGH (ref 70–99)
HCT: 40 % (ref 39.0–52.0)
Hemoglobin: 13.6 g/dL (ref 13.0–17.0)
Potassium: 4.3 mEq/L (ref 3.5–5.1)
Sodium: 143 mEq/L (ref 135–145)
TCO2: 25 mmol/L (ref 0–100)

## 2010-06-17 LAB — PREPARE PLATELETS: Unit division: 0

## 2010-06-17 LAB — EXPECTORATED SPUTUM ASSESSMENT W REFEX TO RESP CULTURE

## 2010-06-17 LAB — PLATELET COUNT: Platelets: 81 10*3/uL — ABNORMAL LOW (ref 150–400)

## 2010-06-17 LAB — ABO/RH: ABO/RH(D): B POS

## 2010-06-17 LAB — MRSA PCR SCREENING
MRSA by PCR: NEGATIVE
MRSA by PCR: NEGATIVE

## 2010-06-17 LAB — BRAIN NATRIURETIC PEPTIDE
Pro B Natriuretic peptide (BNP): 143 pg/mL — ABNORMAL HIGH (ref 0.0–100.0)
Pro B Natriuretic peptide (BNP): 724 pg/mL — ABNORMAL HIGH (ref 0.0–100.0)

## 2010-06-17 LAB — EXPECTORATED SPUTUM ASSESSMENT W GRAM STAIN, RFLX TO RESP C

## 2010-06-17 LAB — LACTIC ACID, PLASMA: Lactic Acid, Venous: 1.9 mmol/L (ref 0.5–2.2)

## 2010-06-17 LAB — HEMOGLOBIN AND HEMATOCRIT, BLOOD
HCT: 30.9 % — ABNORMAL LOW (ref 39.0–52.0)
Hemoglobin: 10.1 g/dL — ABNORMAL LOW (ref 13.0–17.0)

## 2010-06-18 NOTE — Progress Notes (Signed)
**Note De-Identified Rebel Willcutt Obfuscation** Summary: RX REFILL  Phone Note Call from Patient Call back at [phone]   Caller: PT Summary of Call: COUMADIN REFILL NEEDS [facility] [place] Initial call taken by: Faythe Ghee,  June 10, 2010 1:34 PM    Prescriptions: WARFARIN SODIUM 5 MG TABS (WARFARIN SODIUM) m,w,fri 2.5mg  all otherdays he takes 5  #45 x 2   Entered by:   Larita Fife Arbadella Kimbler LPN   Authorized by:   Joni Reining, NP   Signed by:   Larita Fife Ruble Buttler LPN on 10/05/1599   Method used:   [facility]ronically to        [facility] [phone]* (retail)       [address]       [place], [place]  [address]       Ph: [phone]       Fax: [phone]   RxID:   [id number]

## 2010-06-19 ENCOUNTER — Ambulatory Visit (HOSPITAL_COMMUNITY): Payer: PRIVATE HEALTH INSURANCE

## 2010-06-20 LAB — BASIC METABOLIC PANEL
BUN: 8 mg/dL (ref 6–23)
CO2: 25 mEq/L (ref 19–32)
Calcium: 8.7 mg/dL (ref 8.4–10.5)
Chloride: 102 mEq/L (ref 96–112)
Creatinine, Ser: 1.15 mg/dL (ref 0.4–1.5)
GFR calc Af Amer: >60 mL/min (ref >60)
GFR calc non Af Amer: >60 mL/min (ref >60)
Glucose, Bld: 103 mg/dL — ABNORMAL HIGH (ref 70–99)
Potassium: 3.4 mEq/L — ABNORMAL LOW (ref 3.5–5.1)
Sodium: 137 mEq/L (ref 135–145)

## 2010-06-20 LAB — DIFFERENTIAL
Basophils Absolute: 0 10*3/uL (ref 0.0–0.1)
Basophils Relative: 1 % (ref 0–1)
Eosinophils Absolute: 0.4 10*3/uL (ref 0.0–0.7)
Eosinophils Relative: 5 % (ref 0–5)
Lymphocytes Relative: 53 % — ABNORMAL HIGH (ref 12–46)
Lymphs Abs: 4.6 10*3/uL — ABNORMAL HIGH (ref 0.7–4.0)
Monocytes Absolute: 1 10*3/uL (ref 0.1–1.0)
Monocytes Relative: 12 % (ref 3–12)
Neutro Abs: 2.7 10*3/uL (ref 1.7–7.7)
Neutrophils Relative %: 31 % — ABNORMAL LOW (ref 43–77)

## 2010-06-20 LAB — POCT CARDIAC MARKERS
CKMB, poc: 1.4 ng/mL (ref 1.0–8.0)
CKMB, poc: <1 ng/mL — ABNORMAL LOW (ref 1.0–8.0)
Myoglobin, poc: 76.1 ng/mL (ref 12–200)
Myoglobin, poc: 93.2 ng/mL (ref 12–200)
Troponin i, poc: <0.05 ng/mL (ref 0.00–0.09)
Troponin i, poc: <0.05 ng/mL (ref 0.00–0.09)

## 2010-06-20 LAB — D-DIMER, QUANTITATIVE (NOT AT ARMC): D-Dimer, Quant: 0.33 ug/mL-FEU (ref 0.00–0.48)

## 2010-06-20 LAB — CBC
HCT: 47.2 % (ref 39.0–52.0)
Hemoglobin: 15.3 g/dL (ref 13.0–17.0)
MCH: 29.4 pg (ref 26.0–34.0)
MCHC: 32.4 g/dL (ref 30.0–36.0)
MCV: 90.8 fL (ref 78.0–100.0)
Platelets: 154 10*3/uL (ref 150–400)
RBC: 5.2 MIL/uL (ref 4.22–5.81)
RDW: 13.5 % (ref 11.5–15.5)
WBC: 8.8 10*3/uL (ref 4.0–10.5)

## 2010-06-21 ENCOUNTER — Ambulatory Visit (HOSPITAL_COMMUNITY): Payer: PRIVATE HEALTH INSURANCE

## 2010-06-24 ENCOUNTER — Ambulatory Visit (HOSPITAL_COMMUNITY): Payer: PRIVATE HEALTH INSURANCE

## 2010-06-24 ENCOUNTER — Encounter: Payer: Self-pay | Admitting: Cardiology

## 2010-06-24 ENCOUNTER — Encounter (INDEPENDENT_AMBULATORY_CARE_PROVIDER_SITE_OTHER): Payer: PRIVATE HEALTH INSURANCE

## 2010-06-24 DIAGNOSIS — I80299 Phlebitis and thrombophlebitis of other deep vessels of unspecified lower extremity: Secondary | ICD-10-CM

## 2010-06-24 DIAGNOSIS — Z7901 Long term (current) use of anticoagulants: Secondary | ICD-10-CM

## 2010-06-24 LAB — CONVERTED CEMR LAB: POC INR: 2.6

## 2010-06-25 ENCOUNTER — Telehealth: Payer: Self-pay | Admitting: Adult Health

## 2010-06-25 NOTE — Telephone Encounter (Signed)
PT has run out of tramadol 50mg . [facility] [place]. He has been taking two when his chest hurts.

## 2010-06-26 ENCOUNTER — Ambulatory Visit (HOSPITAL_COMMUNITY): Payer: PRIVATE HEALTH INSURANCE

## 2010-06-28 ENCOUNTER — Ambulatory Visit (HOSPITAL_COMMUNITY): Payer: PRIVATE HEALTH INSURANCE

## 2010-06-30 ENCOUNTER — Other Ambulatory Visit: Payer: Self-pay | Admitting: Cardiology

## 2010-07-01 ENCOUNTER — Ambulatory Visit (HOSPITAL_COMMUNITY): Payer: PRIVATE HEALTH INSURANCE

## 2010-07-03 ENCOUNTER — Ambulatory Visit (HOSPITAL_COMMUNITY): Payer: PRIVATE HEALTH INSURANCE

## 2010-07-04 ENCOUNTER — Other Ambulatory Visit: Payer: Self-pay | Admitting: Cardiology

## 2010-07-04 NOTE — Telephone Encounter (Signed)
Wilder °

## 2010-07-04 NOTE — Medication Information (Signed)
Summary: ccr-lr  Anticoagulant Therapy  Managed by: Vashti Hey, RN PCP: [name] Supervising MD: Dietrich Pates MD, Molly Maduro Indication 1: Pulmonary Embolism Lab Used: [facility] [place] Site: [place] INR POC 2.6  Dietary changes: no    Health status changes: no    Bleeding/hemorrhagic complications: no    Recent/future hospitalizations: no    Any changes in medication regimen? no    Recent/future dental: no  Any missed doses?: no       Is patient compliant with meds? yes       Allergies: 1)  ! * Lisinopril  Anticoagulation Management History:      The patient is taking warfarin and comes in today for a routine follow up visit.  The patient is taking warfarin for Bilateral Pulmonary Emboli.  Negative risk factors for bleeding include an age less than 72 years old, no history of CVA/TIA, no history of GI bleeding, and absence of serious comorbidities.  The bleeding index is 'low risk'.  Positive CHADS2 values include History of HTN.  Negative CHADS2 values include History of CHF, Age > 6 years old, History of Diabetes, and Prior Stroke/CVA/TIA.  The start date was 04/09/2010.  Anticoagulation responsible provider: Dietrich Pates MD, Molly Maduro.  INR POC: 2.6.  Cuvette Lot#: [id number].    Anticoagulation Management Assessment/Plan:      The patient's current anticoagulation dose is Warfarin sodium 5 mg tabs: m,w,fri 2.5mg  all otherdays he takes 5.  The target INR is 2.0-3.0.  The next INR is due 07/22/2010.  Anticoagulation instructions were given to patient.  Results were reviewed/authorized by Vashti Hey, RN.  He was notified by Vashti Hey RN.         Prior Anticoagulation Instructions: INR 3.2 Decrease coumadin to 2.5mg  once daily except 5mg  on Saturdays  Current Anticoagulation Instructions: INR 2.6 Continue coumadin 2.5mg  once daily except 5mg  on Saturdays

## 2010-07-05 ENCOUNTER — Ambulatory Visit (HOSPITAL_COMMUNITY): Payer: PRIVATE HEALTH INSURANCE

## 2010-07-06 ENCOUNTER — Other Ambulatory Visit: Payer: Self-pay | Admitting: *Deleted

## 2010-07-08 ENCOUNTER — Ambulatory Visit (HOSPITAL_COMMUNITY): Payer: PRIVATE HEALTH INSURANCE | Attending: Cardiology

## 2010-07-08 DIAGNOSIS — Z5189 Encounter for other specified aftercare: Secondary | ICD-10-CM | POA: Insufficient documentation

## 2010-07-08 DIAGNOSIS — I2510 Atherosclerotic heart disease of native coronary artery without angina pectoris: Secondary | ICD-10-CM | POA: Insufficient documentation

## 2010-07-08 DIAGNOSIS — Z951 Presence of aortocoronary bypass graft: Secondary | ICD-10-CM | POA: Insufficient documentation

## 2010-07-08 DIAGNOSIS — I252 Old myocardial infarction: Secondary | ICD-10-CM | POA: Insufficient documentation

## 2010-07-10 ENCOUNTER — Ambulatory Visit (HOSPITAL_COMMUNITY): Payer: PRIVATE HEALTH INSURANCE

## 2010-07-12 ENCOUNTER — Ambulatory Visit (HOSPITAL_COMMUNITY): Payer: PRIVATE HEALTH INSURANCE

## 2010-07-15 ENCOUNTER — Ambulatory Visit (HOSPITAL_COMMUNITY): Payer: PRIVATE HEALTH INSURANCE

## 2010-07-16 ENCOUNTER — Other Ambulatory Visit: Payer: Self-pay | Admitting: *Deleted

## 2010-07-17 ENCOUNTER — Ambulatory Visit (HOSPITAL_COMMUNITY): Payer: PRIVATE HEALTH INSURANCE

## 2010-07-17 ENCOUNTER — Telehealth: Payer: Self-pay | Admitting: Cardiology

## 2010-07-17 NOTE — Telephone Encounter (Signed)
PT IS ON A COUPLE DIFFERENT DOSES AND KINDS OF METOPROLOL BY DIFFERENT DOCTORS THEY NEED TO KNOW WHAT HIS IS SUPPOSE TO BE ON. THE PATIENT DOESN'T KNOW.

## 2010-07-18 ENCOUNTER — Other Ambulatory Visit: Payer: Self-pay | Admitting: *Deleted

## 2010-07-18 MED ORDER — METOPROLOL SUCCINATE ER 50 MG PO TB24: 75.0000 mg | ORAL_TABLET | Freq: Every day | ORAL | Status: DC

## 2010-07-19 ENCOUNTER — Ambulatory Visit (HOSPITAL_COMMUNITY): Payer: PRIVATE HEALTH INSURANCE

## 2010-07-22 ENCOUNTER — Ambulatory Visit (INDEPENDENT_AMBULATORY_CARE_PROVIDER_SITE_OTHER): Payer: PRIVATE HEALTH INSURANCE | Admitting: *Deleted

## 2010-07-22 ENCOUNTER — Ambulatory Visit (HOSPITAL_COMMUNITY): Payer: PRIVATE HEALTH INSURANCE

## 2010-07-22 DIAGNOSIS — I2699 Other pulmonary embolism without acute cor pulmonale: Secondary | ICD-10-CM

## 2010-07-22 DIAGNOSIS — Z7901 Long term (current) use of anticoagulants: Secondary | ICD-10-CM

## 2010-07-22 LAB — POCT INR: INR: 2.3

## 2010-07-24 ENCOUNTER — Ambulatory Visit (HOSPITAL_COMMUNITY): Payer: PRIVATE HEALTH INSURANCE

## 2010-07-26 ENCOUNTER — Ambulatory Visit (HOSPITAL_COMMUNITY): Payer: PRIVATE HEALTH INSURANCE

## 2010-07-26 ENCOUNTER — Emergency Department (HOSPITAL_COMMUNITY): Payer: PRIVATE HEALTH INSURANCE

## 2010-07-26 ENCOUNTER — Inpatient Hospital Stay (HOSPITAL_COMMUNITY)
Admission: AD | Admit: 2010-07-26 | Discharge: 2010-07-27 | DRG: 313 | Disposition: A | Discharge status: Home Health | Payer: PRIVATE HEALTH INSURANCE | Source: Other Acute Inpatient Hospital | Attending: Cardiology | Admitting: Cardiology

## 2010-07-26 ENCOUNTER — Emergency Department (HOSPITAL_COMMUNITY)
Admission: EM | Admit: 2010-07-26 | Discharge: 2010-07-26 | Disposition: A | Discharge status: Other Acute Inpatient Hospital | Payer: PRIVATE HEALTH INSURANCE | Source: Home / Self Care | Attending: Emergency Medicine | Admitting: Emergency Medicine

## 2010-07-26 DIAGNOSIS — R0789 Other chest pain: Principal | ICD-10-CM | POA: Diagnosis present

## 2010-07-26 DIAGNOSIS — Z951 Presence of aortocoronary bypass graft: Secondary | ICD-10-CM

## 2010-07-26 DIAGNOSIS — Z9861 Coronary angioplasty status: Secondary | ICD-10-CM

## 2010-07-26 DIAGNOSIS — Z7982 Long term (current) use of aspirin: Secondary | ICD-10-CM

## 2010-07-26 DIAGNOSIS — R079 Chest pain, unspecified: Secondary | ICD-10-CM

## 2010-07-26 DIAGNOSIS — Z87891 Personal history of nicotine dependence: Secondary | ICD-10-CM

## 2010-07-26 DIAGNOSIS — Z79899 Other long term (current) drug therapy: Secondary | ICD-10-CM | POA: Insufficient documentation

## 2010-07-26 DIAGNOSIS — I252 Old myocardial infarction: Secondary | ICD-10-CM

## 2010-07-26 DIAGNOSIS — Z86711 Personal history of pulmonary embolism: Secondary | ICD-10-CM

## 2010-07-26 DIAGNOSIS — Z7901 Long term (current) use of anticoagulants: Secondary | ICD-10-CM

## 2010-07-26 DIAGNOSIS — I2510 Atherosclerotic heart disease of native coronary artery without angina pectoris: Secondary | ICD-10-CM | POA: Diagnosis present

## 2010-07-26 DIAGNOSIS — I10 Essential (primary) hypertension: Secondary | ICD-10-CM | POA: Diagnosis present

## 2010-07-26 DIAGNOSIS — I2589 Other forms of chronic ischemic heart disease: Secondary | ICD-10-CM | POA: Diagnosis present

## 2010-07-26 DIAGNOSIS — E785 Hyperlipidemia, unspecified: Secondary | ICD-10-CM | POA: Diagnosis present

## 2010-07-26 LAB — BASIC METABOLIC PANEL
BUN: 9 mg/dL (ref 6–23)
CO2: 25 mEq/L (ref 19–32)
Calcium: 8.5 mg/dL (ref 8.4–10.5)
Chloride: 109 mEq/L (ref 96–112)
Creatinine, Ser: 1 mg/dL (ref 0.4–1.5)
GFR calc Af Amer: >60 mL/min (ref >60)
GFR calc non Af Amer: >60 mL/min (ref >60)
Glucose, Bld: 134 mg/dL — ABNORMAL HIGH (ref 70–99)
Potassium: 3.6 mEq/L (ref 3.5–5.1)
Sodium: 138 mEq/L (ref 135–145)

## 2010-07-26 LAB — DIFFERENTIAL
Basophils Absolute: 0 10*3/uL (ref 0.0–0.1)
Basophils Relative: 1 % (ref 0–1)
Eosinophils Absolute: 0.5 10*3/uL (ref 0.0–0.7)
Eosinophils Relative: 7 % — ABNORMAL HIGH (ref 0–5)
Lymphocytes Relative: 54 % — ABNORMAL HIGH (ref 12–46)
Lymphs Abs: 3.5 10*3/uL (ref 0.7–4.0)
Monocytes Absolute: 0.5 10*3/uL (ref 0.1–1.0)
Monocytes Relative: 8 % (ref 3–12)
Neutro Abs: 2 10*3/uL (ref 1.7–7.7)
Neutrophils Relative %: 31 % — ABNORMAL LOW (ref 43–77)

## 2010-07-26 LAB — PROTIME-INR
INR: 2.06 — ABNORMAL HIGH (ref 0.00–1.49)
Prothrombin Time: 23.4 seconds — ABNORMAL HIGH (ref 11.6–15.2)

## 2010-07-26 LAB — CARDIAC PANEL(CRET KIN+CKTOT+MB+TROPI)
CK, MB: 2.4 ng/mL (ref 0.3–4.0)
Relative Index: 1.3 (ref 0.0–2.5)
Total CK: 188 U/L (ref 7–232)
Troponin I: 0.08 ng/mL — ABNORMAL HIGH (ref 0.00–0.06)

## 2010-07-26 LAB — CBC
HCT: 45.1 % (ref 39.0–52.0)
Hemoglobin: 14.7 g/dL (ref 13.0–17.0)
MCH: 27.8 pg (ref 26.0–34.0)
MCHC: 32.6 g/dL (ref 30.0–36.0)
MCV: 85.3 fL (ref 78.0–100.0)
Platelets: 172 10*3/uL (ref 150–400)
RBC: 5.29 MIL/uL (ref 4.22–5.81)
RDW: 15.4 % (ref 11.5–15.5)
WBC: 6.6 10*3/uL (ref 4.0–10.5)

## 2010-07-26 LAB — POCT CARDIAC MARKERS
CKMB, poc: <1 ng/mL — ABNORMAL LOW (ref 1.0–8.0)
Myoglobin, poc: 69 ng/mL (ref 12–200)
Troponin i, poc: <0.05 ng/mL (ref 0.00–0.09)

## 2010-07-26 LAB — CK TOTAL AND CKMB (NOT AT ARMC)
CK, MB: 3.2 ng/mL (ref 0.3–4.0)
Relative Index: 1.6 (ref 0.0–2.5)
Total CK: 197 U/L (ref 7–232)

## 2010-07-26 LAB — TROPONIN I: Troponin I: 0.06 ng/mL (ref 0.00–0.06)

## 2010-07-27 LAB — LIPID PANEL
Cholesterol: 95 mg/dL (ref 0–200)
HDL: 35 mg/dL — ABNORMAL LOW (ref >39)
LDL Cholesterol: 40 mg/dL (ref 0–99)
Total CHOL/HDL Ratio: 2.7 RATIO
Triglycerides: 101 mg/dL (ref <150)
VLDL: 20 mg/dL (ref 0–40)

## 2010-07-27 LAB — CARDIAC PANEL(CRET KIN+CKTOT+MB+TROPI)
CK, MB: 2.7 ng/mL (ref 0.3–4.0)
CK, MB: 2.8 ng/mL (ref 0.3–4.0)
Relative Index: 1.3 (ref 0.0–2.5)
Relative Index: 1.8 (ref 0.0–2.5)
Total CK: 160 U/L (ref 7–232)
Total CK: 209 U/L (ref 7–232)

## 2010-07-27 LAB — CBC
HCT: 42.7 % (ref 39.0–52.0)
Hemoglobin: 14.2 g/dL (ref 13.0–17.0)
MCH: 28 pg (ref 26.0–34.0)
MCHC: 33.3 g/dL (ref 30.0–36.0)
MCV: 84.2 fL (ref 78.0–100.0)
Platelets: 163 10*3/uL (ref 150–400)
RBC: 5.07 MIL/uL (ref 4.22–5.81)
RDW: 15.1 % (ref 11.5–15.5)
WBC: 5.9 10*3/uL (ref 4.0–10.5)

## 2010-07-27 LAB — BASIC METABOLIC PANEL WITH GFR
BUN: 9 mg/dL (ref 6–23)
CO2: 25 meq/L (ref 19–32)
Calcium: 8.4 mg/dL (ref 8.4–10.5)
Chloride: 103 meq/L (ref 96–112)
Creatinine, Ser: 1.04 mg/dL (ref 0.4–1.5)
GFR calc non Af Amer: >60 mL/min
Glucose, Bld: 177 mg/dL — ABNORMAL HIGH (ref 70–99)
Potassium: 3.5 meq/L (ref 3.5–5.1)
Sodium: 136 meq/L (ref 135–145)

## 2010-07-27 LAB — PROTIME-INR
INR: 1.94 — ABNORMAL HIGH (ref 0.00–1.49)
Prothrombin Time: 22.3 seconds — ABNORMAL HIGH (ref 11.6–15.2)

## 2010-07-27 LAB — HEMOGLOBIN A1C
Hgb A1c MFr Bld: 5.8 % — ABNORMAL HIGH (ref <5.7)
Mean Plasma Glucose: 120 mg/dL — ABNORMAL HIGH (ref <117)

## 2010-07-28 NOTE — H&P (Addendum)
[name], [name]               ACCOUNT NO.:  [id number]  MEDICAL RECORD NO.:  [id number]           PATIENT TYPE:  I  LOCATION:  3736                         FACILITY:  [facility]  PHYSICIAN:  Madolyn Frieze. Brian Som, MD, FACCDATE OF BIRTH:  17-Nov-1945  DATE OF ADMISSION:  07/26/2010 DATE OF DISCHARGE:                             HISTORY & PHYSICAL   PRIMARY CARDIOLOGIST:  Gerrit Friends. Dietrich Pates, MD, [facility].  PRIMARY SURGEON:  Evelene Croon, MD.  PRIMARY MEDICAL DOCTOR:  Tesfaye D. Felecia Shelling, MD.  CHIEF COMPLAINT:  Chest pain.  HISTORY OF PRESENT ILLNESS:  [name] is a very pleasant 65 year old gentleman with a history of CAD, status post CABG, March 18, 2010, complicated by a postop PE, diagnosed on Christmas Eve 2011, as well as ischemic cardiomyopathy with EF of 35%-40%.  He is doing very well since his last discharge from the hospital, and participating cardiac rehab three times a week, as well as walking in the mall on his off days without any complaints of chest pain or shortness of breath.  He ate breakfast this morning, while watching the TV, felt a fire shock like sensation in his mid sternum, lasting a split second.  It returns twice while watching TV, lasting 3 seconds each maximum.  He told his friend, who advised him to go to the ER, so he went to [facility]. There, cardiac enzymes were negative x2.  It does not feel like his prior angina.  He possibly has had some shortness of breath, but also endorses a cold this past week for which he has been using some over-the- counter products including Mucinex.  No diaphoresis, nausea, vomiting, syncope, or palpitations.  He still has occasional pain at his incision site for which he was using tramadol with good relief.  EKG shows normal sinus rhythm with Qs in II, III, aVF and V2-V6, which may be slightly more present in the anterior leads, but, comparable to EKG in December 2011.  There is less than RSR pattern in V1 on this  EKGs as compared to late December.  PAST MEDICAL HISTORY: 1. CAD status post CABG x4 with LIMA to the LAD, SVG to the diagonal,     sequential SVG to the OM-1 and to the left circumflex March 18, 2010, by Dr. Laneta Simmers.     a.     History of NSTEMI at that time, December 2011     b.     Anterior/inferior MI in 2004, status post drug-eluting stent      placed to the RCA as well as PTCA to the LAD. 2. Postoperative PE, diagnosed in March 30, 2010, on Coumadin with     last INR of 2.3 on July 22, 2010 as an outpatient. 3. Hypertension. 4. Hyperlipidemia. 5. Remote tobacco abuse. 6. Postoperative thrombocytopenia. 7. Ischemic cardiomyopathy with EF of 35%-40% by echo on April 08, 2010 with mild-to-moderate LVH as well as akinesis of the mid     distal anterior and apical myocardium.     a.     Not on ACE inhibitor secondary to  history of angioedema.  MEDICATIONS: 1. Aspirin 81 mg daily. 2. Zocor 40 mg daily. 3. Coumadin 5 mg on Saturday and 2.5 mg all other days. 4. Metoprolol XL 50 mg one half tablet daily. 5. Tramadol p.r.n.  ALLERGIES:  ACE INHIBITOR cause angioedema.  SOCIAL HISTORY:  [name] is widowed and currently has a girlfriend. He lives in [place].  He has five children.  He is on disability.  He quit tobacco 7 years ago.  He denies any alcohol use.  FAMILY HISTORY:  His mother died of cancer and father died of coronary artery disease.  REVIEW OF SYMPTOMS:  No fevers, chills, edema, palpitations, nausea, vomiting, diarrhea, bright blood per rectum, melena, or hematemesis. All other systems reviewed and otherwise negative except for those noted in HPI.  LABS:  WBC 6.6, hemoglobin 14.7, hematocrit 45.1, platelet count 172. Sodium 138, potassium 3.6, chloride 109, CO2 25, glucose 134, BUN 9, creatinine 1.0.  Cardiac enzymes negative x2.  CBC did show increase in lymphocytes.  EKG:  Please see HPI.  RADIOLOGY:  Chest x-ray, status post CABG  without acute abnormality. Emphysematous changes.  PHYSICAL EXAMINATION:  VITAL SIGNS:  Temperature 98.4, pulse 75, respirations 18, blood pressure 150/83, pulse ox 97% on room air. GENERAL:  This is a pleasant African American male, in no acute distress. HEENT:  Normocephalic, atraumatic, with extraocular movements intact. Clear sclerae and nares without discharge. NECK:  Supple without carotid bruit. HEART:  Auscultation of the heart reveals regular rate and rhythm with S1 and S2 without murmurs, rubs, or gallops. CHEST:  Has a well-healing midline incision without erythema or separation. LUNGS:  Clear to auscultation bilaterally without wheezes, rales, or rhonchi.  Breathing is unlabored. ABDOMEN:  Soft, nontender, nondistended.  Positive bowel sounds. EXTREMITIES:  Warm and dry without edema.  He has 2+ pedal pulses bilaterally. NEUROLOGIC:  He is alert and oriented x3, responds to questions appropriately with a normal affect.  ASSESSMENT/PLAN:  The patient was seen and examined by Dr. Jens [name] and myself.  This is a 65 year old gentleman with a past medical history that includes coronary artery disease, status post coronary artery bypass graft, pulmonary embolism, hypertension, and ischemic cardiomyopathy, who presents with three brief episodes of chest pain, lasting 3 seconds maximum, which he describes this evening, shock-like sensation without any assistive symptoms.  It is not pleuritic or positional.  He is presently pain free.  His EKG shows normal sinus rhythm with evidence of an anterior and inferior myocardial infarction, which was seen on prior EKGs.  Chest pain is extremely atypical and initial enzymes were negative.  He was also therapeutic on Coumadin 4 days ago, although INR has not yet been checked at this admission in the ER.  We, however doubt pulmonary embolism given the fact that he is not short of breath, not tachycardic, already on Coumadin.  This may  be musculoskeletal or some derivation of neuropathy given his incision.  We plan to admit the patient to the hospital and cycle cardiac enzymes overnight.  His Coumadin will be continued along with his aspirin, statin, and beta-blocker.  His blood pressure is little bit on the high side on arrival to [facility], but this may be in the setting of missing his morning medicines.  If it remains high throughout the duration of the rest of his stay, he may need up titration of the anti-hypertensive medicines. Otherwise, he will likely be a candidate for discharge in the morning if he remains stable, pain free, and  with negative enzymes.  We will continue to hold off on ACE inhibitor given his history of angioedema. He appears well compensated at present.     Sanae Willetts, P.A.C.   ______________________________ Madolyn Frieze. Brian Som, MD, [facility]    DD/MEDQ  D:  07/26/2010  T:  07/27/2010  Job:  [id number]  cc:   Gerrit Friends. Dietrich Pates, MD, [facility] Evelene Croon, M.D. Tesfaye D. Felecia Shelling, MD  Electronically Signed by Olga Millers MD [facility] on 07/28/2010 01:25:44 PM Electronically Signed by Ronie Spies  on 08/14/2010 04:20:29 PM

## 2010-07-29 ENCOUNTER — Ambulatory Visit (HOSPITAL_COMMUNITY): Payer: PRIVATE HEALTH INSURANCE

## 2010-07-31 ENCOUNTER — Ambulatory Visit (HOSPITAL_COMMUNITY): Payer: PRIVATE HEALTH INSURANCE

## 2010-07-31 NOTE — Discharge Summary (Signed)
  [name], [name]               ACCOUNT NO.:  [id number]  MEDICAL RECORD NO.:  [id number]           PATIENT TYPE:  I  LOCATION:  3736                         FACILITY:  [facility]  PHYSICIAN:  Madolyn Frieze. Jens Som, MD, FACCDATE OF BIRTH:  [birth year]  DATE OF ADMISSION:  07/26/2010 DATE OF DISCHARGE:  07/27/2010                              DISCHARGE SUMMARY   PRIMARY CARDIOLOGIST:  Gerrit Friends. Dietrich Pates, MD, [facility]  DISCHARGE DIAGNOSES: 1. Atypical chest pain.     a.     Suspect musculoskeletal etiology.  SECONDARY DIAGNOSES: 1. Ischemic cardiomyopathy.     a.     Ejection fraction 35-40%.     b.     Status post CABG, December 2011.     c.     Postop pulmonary embolus.     d.     Status post non-ST elevation myocardial infarction, 12/11.     e.     Status post inferior myocardial infarction in 2004, status      post DES of right coronary artery, PTCA of left anterior descending. 2. Chronic Coumadin. 3. Hypertension. 4. Dyslipidemia. 5. History of tobacco.  REASON FOR ADMISSION:  The patient is a 65 year old male, with complex cardiac history, as outlined above, who presented to the emergency room with complaint of chest pain.  His symptoms were felt to be atypical, and he was admitted for overnight observation and rule out of myocardial  infarction.  HOSPITAL COURSE:  Serial cardiac markers were drawn and nondiagnostic for definite ischemia, with a peak troponin of 0.09, in the context of normal CPK/MBs.  No further workup was recommended, and the patient was cleared for discharge the following morning, in hemodynamically stable condition.  No medication adjustments were recommended.  DISCHARGE LABS:  CPK 160/2.8, troponin I 0.07, total cholesterol 95, triglyceride 101, HDL 31, and LDL 40.  CBC normal.  INR 1.94.  OUTSTANDING LABS:  Normal CPK/MBs, peak troponin-I 0.09, hemoglobin A1c 5.8, potassium 3.6, BUN 9, creatinine 1.0 on admission.  INR 2.1 on admission.  Admission  chest x-ray:  Post CABG; emphysematous changes; no acute abnormalities.  DISPOSITION:  Stable.  FOLLOWUP:  Dr. Garwood Bing, in our [facility], in 2 weeks. Arrangements to made to be made through our office.  DISCHARGE MEDICATIONS: 1. Aspirin 81 mg daily. 2. Lopressor 75 mg at bedtime. 3. Simvastatin 40 mg daily. 4. Coumadin 5 mg as directed. 5. Nitrostat 0.4 mg daily.  DURATION OF DISCHARGE ENCOUNTER:  Greater than 30 minutes, including physician time.     Gene Serpe, PA-C   ______________________________ Madolyn Frieze. Jens Som, MD, [facility]    GS/MEDQ  D:  07/27/2010  T:  07/27/2010  Job:  [id number]  cc:   Ninetta Lights D. Felecia Shelling, MD  Electronically Signed by Rozell Searing PA-C on 07/30/2010 09:16:55 PM Electronically Signed by Olga Millers MD [facility] on 07/31/2010 01:35:01 PM

## 2010-08-02 ENCOUNTER — Ambulatory Visit (HOSPITAL_COMMUNITY): Payer: PRIVATE HEALTH INSURANCE

## 2010-08-05 ENCOUNTER — Ambulatory Visit (HOSPITAL_COMMUNITY): Payer: PRIVATE HEALTH INSURANCE

## 2010-08-07 ENCOUNTER — Ambulatory Visit (HOSPITAL_COMMUNITY): Payer: PRIVATE HEALTH INSURANCE | Attending: Cardiology

## 2010-08-07 DIAGNOSIS — I252 Old myocardial infarction: Secondary | ICD-10-CM | POA: Insufficient documentation

## 2010-08-07 DIAGNOSIS — I2510 Atherosclerotic heart disease of native coronary artery without angina pectoris: Secondary | ICD-10-CM | POA: Insufficient documentation

## 2010-08-07 DIAGNOSIS — Z951 Presence of aortocoronary bypass graft: Secondary | ICD-10-CM | POA: Insufficient documentation

## 2010-08-07 DIAGNOSIS — Z5189 Encounter for other specified aftercare: Secondary | ICD-10-CM | POA: Insufficient documentation

## 2010-08-09 ENCOUNTER — Ambulatory Visit (INDEPENDENT_AMBULATORY_CARE_PROVIDER_SITE_OTHER): Payer: PRIVATE HEALTH INSURANCE | Admitting: Adult Health

## 2010-08-09 ENCOUNTER — Encounter: Payer: Self-pay | Admitting: Adult Health

## 2010-08-09 ENCOUNTER — Ambulatory Visit (HOSPITAL_COMMUNITY): Payer: PRIVATE HEALTH INSURANCE

## 2010-08-09 DIAGNOSIS — I2510 Atherosclerotic heart disease of native coronary artery without angina pectoris: Secondary | ICD-10-CM

## 2010-08-09 DIAGNOSIS — I255 Ischemic cardiomyopathy: Secondary | ICD-10-CM

## 2010-08-09 DIAGNOSIS — I2699 Other pulmonary embolism without acute cor pulmonale: Secondary | ICD-10-CM

## 2010-08-09 DIAGNOSIS — I2589 Other forms of chronic ischemic heart disease: Secondary | ICD-10-CM

## 2010-08-09 NOTE — Progress Notes (Signed)
HPI:  [name] is a 65 y/o patient of Dr. Dietrich Pates who is here today on follow-up after hospitalization in April 2012 for chest pain.  He was ruled out for cardiac etiology and diagnosed with musculoskeletal pain.  The has a history of ICM with EF of 35%-40%,, CABG Dec 2001, PE on coumadin, IMI 2004,, hypertension, dyslipidemia.  He comes today without complaint. He has had no recurrence of chest pain, shortness of breath or edema.  He is complaint with medications.  Allergies  Allergen Reactions  . Lisinopril     Current Outpatient Prescriptions  Medication Sig Dispense Refill  . aspirin 81 MG tablet Take 81 mg by mouth daily.        Marland Kitchen docusate sodium (COLACE) 100 MG capsule Take 100 mg by mouth as needed.        Marland Kitchen ibuprofen (ADVIL,MOTRIN) 200 MG tablet Take 200 mg by mouth as needed.        . metoprolol (TOPROL XL) 50 MG 24 hr tablet Take 1.5 tablets (75 mg total) by mouth daily.  45 tablet  3  . nitroGLYCERIN (NITROSTAT) 0.4 MG SL tablet Place 0.4 mg under the tongue. Take 1 tablet under tongue at onset of chest pain; you may repeat every 5 minutes for up to 3 doses.       . simvastatin (ZOCOR) 40 MG tablet TAKE 1 TABLET BY MOUTH EVERY EVENING  30 tablet  5  . traMADol (ULTRAM) 50 MG tablet Take 50 mg by mouth as needed. For pain.       Marland Kitchen warfarin (COUMADIN) 5 MG tablet Take 5 mg by mouth daily. Take 2.5mg  M,W,F and take 5mg  all other days as directed by Anticoagulation clinic.       Marland Kitchen DISCONTD: acetaminophen (TYLENOL) 325 MG tablet Take 650 mg by mouth as needed.        Marland Kitchen DISCONTD: ferrous sulfate 325 (65 FE) MG tablet Take 325 mg by mouth 2 (two) times daily.        Marland Kitchen DISCONTD: metoprolol (LOPRESSOR) 50 MG tablet Take 1.5 tablets (75 mg total) by mouth daily.        No past medical history on file.  Past Surgical History  Procedure Date  . Cardiac catheterization 06/2002  . Ptca 06/1996    LAD & RCA  . Coronary artery bypass graft 03/18/2010    LIMA-LAD, SVG to diagonal, OM1 & OM2      ZOX:WRUEAV of systems complete and found to be negative unless listed above PHYSICAL EXAM BP 134/81  Pulse 73  Ht 5\' 6"  (1.676 m)  Wt 205 lb (92.987 kg)  BMI 33.09 kg/m2  SpO2 95% General: Well developed, well nourished, in no acute distress Head: Eyes PERRLA, No xanthomas.   Normal cephalic and atramatic  Lungs: Clear bilaterally to auscultation and percussion. Heart: HRRR S1 S2,  Pulses are 2+ & equal.            No carotid bruit. No JVD.  No abdominal bruits. No femoral bruits. Abdomen: Bowel sounds are positive, abdomen soft and non-tender without masses or                  Hernia's noted. Msk:  Back normal, normal gait. Normal strength and tone for age. Well healed sternotomy scar. Extremities: No clubbing, cyanosis or edema.  DP +1 Neuro: Alert and oriented X 3. Psych:  Good affect, responds appropriately

## 2010-08-09 NOTE — Assessment & Plan Note (Signed)
He is to continue on coumadin and be followed in our clinic. He can express concerns or need to be seen by cardiologist if necessary on those visits.

## 2010-08-09 NOTE — Patient Instructions (Signed)
Your physician recommends that you schedule a follow-up appointment in: 6 months  

## 2010-08-09 NOTE — Assessment & Plan Note (Signed)
Stable with recent hospitalization for chest pain, ruled out for MI.  Found to be musculoskeletal. He is stable at present.  No changes in his medications.

## 2010-08-09 NOTE — Assessment & Plan Note (Signed)
EF of 35-40% per recent studies. He is reminded of a low salt diet, and daily wts. He is to call us for wt gain of 2-3 lbs in 1-2 days, or 5 lbs in a week he is to call us.  We will see him in 6 months unless symptomatic

## 2010-08-12 ENCOUNTER — Ambulatory Visit (HOSPITAL_COMMUNITY): Payer: PRIVATE HEALTH INSURANCE

## 2010-08-14 ENCOUNTER — Ambulatory Visit (HOSPITAL_COMMUNITY): Payer: PRIVATE HEALTH INSURANCE

## 2010-08-16 ENCOUNTER — Ambulatory Visit (HOSPITAL_COMMUNITY): Payer: PRIVATE HEALTH INSURANCE

## 2010-08-19 ENCOUNTER — Ambulatory Visit (HOSPITAL_COMMUNITY): Payer: PRIVATE HEALTH INSURANCE

## 2010-08-19 ENCOUNTER — Ambulatory Visit (INDEPENDENT_AMBULATORY_CARE_PROVIDER_SITE_OTHER): Payer: PRIVATE HEALTH INSURANCE | Admitting: *Deleted

## 2010-08-19 DIAGNOSIS — Z7901 Long term (current) use of anticoagulants: Secondary | ICD-10-CM

## 2010-08-19 DIAGNOSIS — I2699 Other pulmonary embolism without acute cor pulmonale: Secondary | ICD-10-CM

## 2010-08-19 LAB — POCT INR: INR: 2.8

## 2010-08-20 NOTE — Assessment & Plan Note (Signed)
[facility]                       [place] CARDIOLOGY OFFICE NOTE   [name]                      MRN:          [id number]  DATE:06/05/2008                            DOB:          1946/01/24    CARDIOLOGIST:  Previous was Dr. Eden [name].   PRIMARY CARE PHYSICIAN:  Brian D. Brian Shelling, MD.   REASON FOR VISIT:  One-year followup.   HISTORY OF PRESENT ILLNESS:  [name] is a 65 year old male patient  with a history of coronary disease, status post prior inferior  myocardial infarction treated with angioplasty of the RCA in 1998.  He  subsequently developed an acute anterior ST elevation myocardial  infarction in March 2004.  He had cutting balloon angioplasty of the  proximal RCA and the mid LAD with stenting of the mid RCA with a Taxus  drug-eluting stent and stenting of the distal RCA with a Taxus drug-  eluting stent.  Relook catheterization 2 days later demonstrated 30%  stenosis in the RCA at the previous angioplasty site, 80% ostial  stenosis in the diagonal branch, 40% ostial stenosis in the circumflex  with 70%-80% in the mid-distal circumflex, and 40% stenosis in the mid-  distal RCA.  His EF at that time was 40%.  His last nuclear study was  performed in April 2008.  This was a low-risk study with no evidence of  ischemia and his EF was 43%.  The patient was last seen by Dr. Eden [name] in  April 2009.  At that time, he was doing well and was asked to return in  1 year.  In the office today, he notes he is doing well without chest  discomfort.  He notes insignificant shortness of breath.  He describes  NYHA class II symptoms.  He denies orthopnea, PND, or pedal edema.  He  denies syncope or near syncope.  He is compliant with his medications.  His cholesterol is followed by Dr. Felecia [name].  He does note some leg pain  from time to time.  However, when asked further about this, he denied  leg pain.   CURRENT MEDICATIONS:  1. Aspirin 325 mg  daily.  2. Metoprolol ER 50 mg daily.  3. Simvastatin 40 mg nightly.   ALLERGIES:  He had a presentation to the emergency room several months  ago with angioedema and his LISINOPRIL was discontinued.   SOCIAL HISTORY:  He denies any further tobacco abuse or drug abuse.   PHYSICAL EXAMINATION:  GENERAL:  He is a well nourished, well developed  male, in no acute distress.  VITAL SIGNS:  Blood pressure is 132/82, pulse 71, weight 220 pounds.  HEENT:  Normal.  NECK: Without JVD.  CARDIAC: Normal S1 and S2.  Regular rate and rhythm.  No S3.  LUNGS:  Clear to auscultation bilaterally.  No wheezing.  No rhonchi.  No rales.  ABDOMEN:  Soft, nontender, normoactive bowel sounds.  No organomegaly.  EXTREMITIES:  Without edema.  NEUROLOGIC:  He is alert and oriented x3.  Cranial nerves II through XII  grossly intact.  VASCULAR:  I cannot appreciate carotid bruits bilaterally.  Femoral  pulses are difficult to palpate bilaterally.  Dorsalis pedis and  posterior tibialis pulses are also difficult to palpate bilaterally.  Electrocardiogram reveals sinus rhythm.  Heart rate is 71, normal axis,  T-wave inversions in III, AVF, and V4 through V6.  This is similar to  previous tracings without significant change.   ASSESSMENT/PLAN:  1. Coronary artery disease status post prior inferior wall myocardial      infarction as well as anterior wall myocardial infarction as      outlined above.  He has been stable without chest symptoms.  His      last Myoview study was in April 2008 and did not demonstrate any      ischemia.  He continues on aspirin and beta-blocker therapy.  2. Ischemic cardiomyopathy with previous ejection fraction of 40-43%.      I do not see that he has had an echocardiogram performed in some      time.  Unfortunately, he is off of lisinopril secondary to possible      angioedema.  I will set him up for an echocardiogram to reassess      his LV function.  If this remains in the 40%  range, he may benefit      further from changing his metoprolol to carvedilol.  He is not      having any symptoms of heart failure at this time.  His functional      class is NYHA class II.  We could certainly consider nitrates and      hydralazine for afterload reduction since he cannot take an ACE      inhibitor.  I will await the findings on his echocardiogram before      making any further medication changes.  3. Dyslipidemia.  This is followed by Dr. Felecia [name].  His goal LDL is less      than or equal to 70.  4. Diminished pulses and leg pain.  The patient will be set up for      ABIs to further assess for peripheral arterial disease.   DISPOSITION:  Followup with me or Dr. Eden [name] in 6 months or sooner  p.r.n.      Brian Newcomer, PA-C  Electronically Signed      Gerrit Friends. Dietrich Pates, MD, [facility]  Electronically Signed   SW/MedQ  DD: 06/05/2008  DT: 06/06/2008  Job #: [id number]   cc:   Brian D. Brian Shelling, MD

## 2010-08-20 NOTE — Assessment & Plan Note (Signed)
OFFICE VISIT   KAIEA, ESSELMAN  DOB:  [birth year]                                        April 15, 2010  CHART #:  [id number]   HISTORY:  The patient comes in today for a postoperative followup.  He  is status post coronary artery bypass grafting x4 on March 18, 2010.  His hospital course was notable for some pulmonary issues which required  aggressive diuresis and pulmonary toilet measures.  He was ultimately  discharged home on March 24, 2010, in good condition and off  supplemental oxygen.  Unfortunately, however, he was readmitted on  March 30, 2010, with bilateral pulmonary emboli.  He was treated with  heparin and Coumadin and was able to be discharged on April 09, 2010,  in improved condition.  He has been followed by the [facility] and his most recent visit was on the 5th.  At which time, his INR  was 4.  His Coumadin dosage was adjusted and currently he is alternating  5 mg with 2.5 mg every other day.  He is scheduled for recheck on  Thursday, 12th.  Overall, he has done well since his discharge.  His  breathing has been stable since his discharge home and home health nurse  is following him.  He is maintaining sats of greater than 90% on room  air and is having minimal shortness of breath with exertion.  He denies  any chest discomfort or lower extremity edema.  His appetite is  gradually improving and he feels that he is getting stronger every day.   PHYSICAL EXAMINATION:  Vital Signs:  Blood pressure is 122/76; pulse is  102, but regular; respirations 16 and unlabored; and O2 sat 96% on room  air.  Chest:  His chest tube and right lower extremity EVH incisions  have all healed well.  There is no erythema or drainage and his sternum  is stable to palpation.  Heart:  Regular rate and rhythm without  murmurs, rubs, or gallops.  Lungs:  Clear to auscultation.  Extremities:  No edema.   Chest x-ray shows resolved pleural  effusions.   ASSESSMENT/PLAN:  The patient is overall doing well status post coronary  artery bypass grafting and bilateral pulmonary emboli.  Upon review of  his medications in the office today, it was noted that he has been  taking Lopressor 50 mg b.i.d. rather than 75 mg b.i.d. which was changed  on his last admission.  This was discussed with the patient and his  caregiver and they will increase his medications accordingly at home.  He is scheduled to follow up with Dr. Clifton James on April 26, 2010, as  well as the [facility] later this week.  At this point, he may  begin driving short distances and increasing his activity as tolerated.  From a surgical standpoint, he is doing very well and we will not need  to see him back on a regular basis henceforth.  He is encouraged to call  if he experiences any problems that he feels may be related to his  surgery.   Coral Ceo, P.A.   GC/MEDQ  D:  04/15/2010  T:  04/16/2010  Job:  [id number]   cc:   Verne Carrow, MD  [facility]

## 2010-08-20 NOTE — Assessment & Plan Note (Signed)
[facility]                       [place] CARDIOLOGY OFFICE NOTE   [name], [name]                      MRN:          [id number]  DATE:06/05/2008                            DOB:          03-May-1945    ADDENDUM   ASSESSMENT AND PLAN:  Diminished pulses and leg pain.  The patient will  be set up for ABIs to further assess for peripheral arterial disease.      Tereso Newcomer, PA-C       Gerrit Friends. Dietrich Pates, MD, [facility]    SW/MedQ  DD: 06/05/2008  DT: 06/06/2008  Job #: [id number]

## 2010-08-20 NOTE — Assessment & Plan Note (Signed)
[facility]                       [place] CARDIOLOGY OFFICE NOTE   [name], [name]                      MRN:          [id number]  DATE:07/22/2007                            DOB:          [birth year]    [name] returns today for followup.  He is a previous patient of Dr.  Dorethea Clan.  He has had coronary artery disease with previous PTCA of the  RCA and LAD in 1998.  His last Myoview was done July 28, 2006.  He  exercised for 6 minutes on a Bruce protocol.  Images showed a small  inferoapical and apical wall infarct with no ischemia. His EF was  thought to be 43%.  He was placed on an ACE inhibitor.  He has been  doing well.  He does not work.  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  He has been compliant with his medications.  He is not having  any significant chest pain, PND or orthopnea, no palpitations.   REVIEW OF SYSTEMS:  Otherwise negative.   MEDICATIONS:  He is on:  1. An aspirin a day.  2. Zocor 40 a day.  3. Toprol 50 a day.  4. Lisinopril 20 a day.   EXAM:  Remarkable for a middle-aged black male with premature gray hair.  Affect is appropriate.  Weight is 209.  Blood pressure is 110/80, pulse 66 and regular,  afebrile.  HEENT:  Unremarkable.  Carotids are normal without bruit.  No lymphadenopathy, thyromegaly or  JVP elevation.  LUNGS:  Clear with good diaphragmatic motion.  No wheezing.  S1-S2, normal heart sounds.  PMI normal.  ABDOMEN:  Benign.  Bowel sounds are positive.  No AAA.  No tenderness.  No bruit.  No hepatosplenomegaly or hepatojugular reflux.  EXTREMITIES:  Distal pulses are intact.  No edema.  NEUROLOGIC:  Nonfocal.  SKIN:  Warm and dry.  No muscular weakness.   I questioned Miki at length.  He has previously done cocaine and drank  too much.  He says he only does occasional weed, but no other hard  drugs.   IMPRESSION:  1. Coronary disease.  He is not having chest pain.  Reasonable risk  factor modification in medical regimen.  Myoview about a year ago      normal.  I think that he can be watched medically and have a      Myoview in another year.  2. Hypertension, currently well controlled.  Continue low-salt diet      and current dose of ACE inhibitor.  3. Hyperlipidemia.  Continue Zocor.  Lipid and liver profile per Dr.      Felecia Shelling.  4. History of drug abuse.  I think for the time-being we are lucky he      is only doing marijuana.      He may need a urine drug screen at random at some point in the      future.  I cautioned him      against use of cocaine in terms of precipitating another heart      attack.  I  will see him back in a year.     Noralyn Pick. Eden Emms, MD, [facility]  Electronically Signed    PCN/MedQ  DD: 07/22/2007  DT: 07/22/2007  Job #: [id number]

## 2010-08-20 NOTE — Op Note (Signed)
[name], [name]               ACCOUNT NO.:  [id number]   MEDICAL RECORD NO.:  [id number]          PATIENT TYPE:  AMB   LOCATION:  DAY                           FACILITY:  [facility]   PHYSICIAN:  Kassie Mends, M.D.      DATE OF BIRTH:  [birth year]   DATE OF PROCEDURE:  11/09/2006  DATE OF DISCHARGE:                               OPERATIVE REPORT   PROCEDURE:  Colonoscopy.   INDICATION FOR EXAM:  [name] is a 65 year old male who presents for  average risk colon cancer screening.   FINDINGS:  1. Normal colon without evidence of polyps, masses, inflammatory      changes, AVMs or diverticula.  2. Normal retroflexed view of the rectum.   RECOMMENDATIONS:  1. He should follow a high fiber diet.  A high-fiber diet is      associated with a decreased risk of colon cancer.  2. Screening colonoscopy in 10 years.   MEDICATIONS:  Demerol 75 mg IV, Versed 4 mg IV.   PROCEDURE TECHNIQUE:  Physical exam was performed.  Informed consent was  obtained from the patient after explaining the benefits, risks and  alternatives to procedure.  The patient was connected to the monitor and  placed in the left lateral position.  Continuous oxygen was provided by  nasal cannula and IV medicine administered through an indwelling  cannula.  After administration of sedation and rectal exam, the  patient's rectum was intubated and the scope was advanced under direct  visualization to the cecum.  The scope was removed slowly by carefully  examining the color, texture, anatomy and integrity of the mucosa on the  way out.  The patient was recovered in endoscopy and discharged home in  satisfactory condition.      Kassie Mends, M.D.  Electronically Signed     SM/MEDQ  D:  11/09/2006  T:  11/09/2006  Job:  [id number]   cc:   Tesfaye D. Felecia Shelling, MD  Fax: [phone]

## 2010-08-23 NOTE — Discharge Summary (Signed)
[name], [name]                         ACCOUNT NO.:  [id number]   MEDICAL RECORD NO.:  [id number]                   PATIENT TYPE:  INP   LOCATION:  4703                                 FACILITY:  [facility]   PHYSICIAN:  Vida Roller, M.D.                DATE OF BIRTH:  1945-05-19   DATE OF ADMISSION:  06/17/2002  DATE OF DISCHARGE:  06/23/2002                                 DISCHARGE SUMMARY   DISCHARGE DIAGNOSES:  1. Status post anterior wall ST elevation myocardial infarction, treated     with percutaneous transluminal coronary angioplasty to the left anterior     descending coronary artery, stent to the mid-right coronary artery, and     stent to the distal right coronary artery.  2. Coronary artery disease.     a. History of diaphragmatic myocardial infarction in 1998, cocaine-        induced.     b. Three-vessel coronary artery disease noted in 1998 at time of        catheterization; status post stenting to the right coronary artery.  3. Ischemic cardiomyopathy with ejection fraction of 40%.  4. Hypertension.  5. Hyperlipidemia.  6. History of cocaine use.   PROCEDURES:  1. Cardiac catheterization by Charlies Constable, M.D.  Please see the dictated     note for complete details:  Results:  LAD, total occlusion proximally,     70% diagonal; circumflex, 50% OM, 70-80% mid-; RCA 90% mid-, 95% distal.     Left ventriculogram with anterolateral and apical akinesis, inferior     hypokinesis.  2. Percutaneous coronary intervention by Charlies Constable, M.D., with PTCA to     the LAD lesion, reducing stenosis from 100 to 30%, status post stenting     to the mid-RCA with reduction of stenosis from 90 to 0%, and stenting to     the distal RCA with reduction of stenosis of 95 to 0%.  3. 2 D echocardiogram 06/21/02:  LVEF estimated at 40%.  This study was     inadequate for the evaluation of left ventricular regional wall motion.     Mild mitral annular calcification.  Left atrium  dilated.  No definitive     LV apical thrombus seen.  Severe hypokinesis of the anterior septum and     the apex noted.  Lateral wall does move.  4. Re-look catheterization by Dr. Juanda Chance on 06/22/02.   HOSPITAL COURSE:  Please see the dictated history and physical by Vida Roller, M.D., for complete details.  Briefly, this 66 year old male was seen  in the [facility] emergency room for an ST elevation MI in the anterior lead  on EKG.  He was transferred emergently to [facility] for emergent  cardiac catheterization.  He underwent the procedure noted above on 06/17/02  by Dr. Charlies Constable.  He tolerated the procedure well without any immediate  complications.  The plan was to perform a re-look catheterization of the LAD  later in the week.  The patient remained stable over the weekend.  Dr.  Juanda Chance had actually placed the patient on intra-aortic balloon pump  prophylactically.  This was discontinued on 06/18/02.  The patient remained  stable over the next several days.  He was transferred to a telemetry bed on  06/20/02.  The smoking cessation consultant saw the patient on 06/21/02.  He  went for re-look catheterization on 06/22/02.  This study revealed the  following:  LAD 30% at PTCA site, 80% diagonal.  The circumflex was 40%  ostial and 70-80% mid-.  RCA was 0% at mid-stent, less than 10% at distal  stent, and 40% mid-stenosis.  Left ventriculogram revealed anterolateral  hypokinesis, apical akinesis.  LVEF 40%.  Dr. Juanda Chance noted that the patient  will be treated medically and would need a Cardiolite probably in about  three months.  On 06/23/02 the patient was seen by Dr. Dorethea Clan and it was felt  he was ready for discharge to home.  The patient was also seen by cardiac  rehab and attempts were made to set him up with follow-up in [place].  Also, the patient has no income and no health insurance.  He has been set up  for several programs through the hospital to help with  medications.  He has  also been set up with as many samples as we have at both our [facility] and  [place] offices, and he is to pick those up.   LABORATORY DATA:  White count 8900, hemoglobin 12.4, hematocrit 36.8,  platelet count 218,000.  INR 1.0.  Sodium 131, potassium 3.9, chloride 99,  CO2 24, glucose 102, BUN 14, creatinine 1.1, calcium 8.4.  Last report of  cardiac enzymes on 06/18/02:  CK 380, CK-MB 18.2.  Beta-natriuretic peptide  on 06/18/02 171.  Total cholesterol 110, triglycerides 115, HDL 41, LDL 46.  Urine drug screen negative on 06/17/02.   DISCHARGE MEDICATIONS:  1. Aspirin 325 mg daily.  2. Plavix 75 mg daily.  3. Captopril 12.5 mg three times daily.  4. Zocor 40 mg q.h.s.  5. Coreg 6.25 mg twice daily.  6. Nitroglycerin p.r.n. chest pain.   DISCHARGE INSTRUCTIONS:  1. The patient is to call our office or 911 with more chest pain.  2. He was to do no driving, heavy lifting, or exertion for two weeks.  3. Diet with low sodium.  4. He is to call our office for any groin swelling, bleeding, or bruising.  5. He has been instructed on how important it is to take his Plavix until     told otherwise.   FOLLOW-UP:  The patient is to pick up sample medications at both the  [place] and [place] offices today.  He will need a follow-up  Cardiolite in approximately three months to reassess.  He will need possible  repeat 2 D echocardiogram in about six to 12 weeks.  He will need follow-up  LFTs and cholesterol study in approximately six to eight weeks.      Tereso Newcomer, P.A.                        Vida Roller, M.D.    SW/MEDQ  D:  06/23/2002  T:  06/23/2002  Job:  [id number]

## 2010-08-23 NOTE — Cardiovascular Report (Signed)
[name], [name]                         ACCOUNT NO.:  [id number]   MEDICAL RECORD NO.:  [id number]                   PATIENT TYPE:  INP   LOCATION:  4703                                 FACILITY:  [facility]   PHYSICIAN:  Charlies Constable, M.D. [facility]              DATE OF BIRTH:  [birth year]   DATE OF PROCEDURE:  06/22/2002  DATE OF DISCHARGE:                              CARDIAC CATHETERIZATION   PROCEDURE PERFORMED:   CARDIOLOGIST:  Charlies Constable, M.D.   INDICATIONS FOR PROCEDURE:  The patient is 65 years old and a diaphragmatic  wall infarction treated with PTCA of the right coronary artery and on March  12 had an acute anterior wall infarction treated with cutting balloon  angioplasty to the LAD and CYPHER stent to two tight lesions in the right  coronary artery.  We brought him back for a relook of the LAD to see if any  more work was needed.   DESCRIPTION OF PROCEDURE:  The procedure was performed via the right femoral  artery with an arterial sheath and a 6 French preformed coronary catheters.  A front wall anterior puncture was performed and Omnipaque contrast was  used.   The patient tolerated the procedure well and left the laboratory in  satisfactory condition.   RESULTS:  1. Left Main Coronary Artery:  The left main coronary artery is free of     significant disease.   1. Left Anterior Descending Artery:  The left anterior descending gave rise     to a moderately large diagonal branch and a septal perforator, and a     small diagonal branch.  There was 30% narrowing at the PTCA site in the     proximal LAD.  There was 80% ostial narrowing at the diagonal branch,     which arose from the PTCA site in the LAD.  There was less than 20%     narrowing in the mid-to-distal LAD at the previous cutting balloon     angioplasty site.   1. Left Circumflex Artery:  The left circumflex artery gave rise to a large     marginal branch and to posterolateral branches.  There was 40%  narrowing     at the ostium of the circumflex artery.  There was 70-80% stenosis in the     mid-to-distal left and before the posterolateral branches.   1. Right Coronary Artery:  The right coronary artery is a large dominant     vessel that supplied two right ventricular branches, the posterior     descending branch and three posterolateral branches.  There was 0%     stenosis at the stent in the midvessel and less than 10% stenosis in the     distal vessel.  There was 40% narrowing in the native vessel at the mid-     to-distal point.   VENTRICULOGRAPHY:  The left ventriculogram performed in the  RAO projection  showed akinesis of the tip of the apex in the inferoapical segment and  hyperkinesis of a small segment in the high lateral wall.  The lateral wall  near the apex moved vigorously, which was an improvement from the previous  study.  The estimated ejection fraction was 40%, which was an improvement  from the previous study.   HEMODYNAMICS:  The aortic pressure was 113/60 with a mean of 82.  Left  ventricular pressure 113/17.    CONCLUSION:  1. Coronary artery disease status post recent anterior wall myocardial     infarction with percutaneous transluminal coronary angioplasty of the     left anterior descending and stenting of two lesions in the right     coronary artery with 30% narrowing at the percutaneous transluminal     coronary angioplasty site in the left anterior descending, 80% ostial     stenosis in the diagonal branch, 40% ostial stenosis in the circumflex     artery with 70-80% stenosis in the mid-to-distal circumflex artery, 0%     stenosis at the stent in the mid right coronary artery, and less than 10%     stenosis at the stent in the distal right coronary artery with 40%     narrowing in the mid-to-distal vessel.  2. Anterolateral hypokinesis and apical wall akinesis with an estimated     ejection fraction of 40%.   RECOMMENDATIONS:  The PTCA site in the  LAD still looks good.  The diagonal  branch of the LAD is compromised, but is not real favorable for treatment  with percutaneous intervention; and, we are not sure it is causing any  problems.  We will plan to continue medical therapy.  We will plan the  discharge tomorrow.  I may want to do an early follow up Cardiolite scan in  three months because of the increased risk of restenosis given the size of  the vessel and treatment with balloon angioplasty alone.                                                 Charlies Constable, M.D. [facility]    BB/MEDQ  D:  06/22/2002  T:  06/23/2002  Job:  [id number]   cc:   Oracle Bing, M.D. [facility]   Cardiopulmonary Lab
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
## 2010-08-23 NOTE — Cardiovascular Report (Signed)
[name], [name]                         ACCOUNT NO.:  [id number]   MEDICAL RECORD NO.:  [id number]                   PATIENT TYPE:  OIB   LOCATION:  2930                                 FACILITY:  [facility]   PHYSICIAN:  Charlies Constable, M.D. [facility]              DATE OF BIRTH:  Aug 15, 1945   DATE OF PROCEDURE:  DATE OF DISCHARGE:                              CARDIAC CATHETERIZATION   CLINICAL INDICATIONS:  The patient is 65 years old, and several years ago,  had a DMI treated with PTCA of the right coronary artery.  He has been  having intermittent chest pain for a week, and had continuous pain beginning  at 7:30 this morning, and presented to our office in route and was seen by  Dr. Dorethea Clan with EKG changes of acute anterior wall infarction.  He was  transferred to [facility] by [facility] for angiography and probable  intervention.   PROCEDURE PERFORMED:  1. Left heart catheterization.  2. Selective coronary angiography.  3. Left ventriculography.  4. Intraaortic balloon insertion.  5. Percutaneous transluminal coronary angioplasty of the left anterior     descending.  6. Stent to the right coronary artery x2.  7. Swan-Ganz catheter.   PROCEDURE:  The procedure was performed as an emergency in the setting of  acute anterior wall myocardial infarction.  Left heart catheterization was  performed percutaneously through the right femoral artery with an arterial  sheath and  6-French preformed coronary catheters.  A front-walled arterial  punch was performed and Omnipaque contrast was used.   After completion of the diagnostic study, we made a decision to place an  intraaortic balloon pump by the right femoral artery.  We did an aortogram,  and there was narrowing in the left iliac, and we decided to use the left  side for the intervention.  We decided to put a balloon pump in because of  severe left ventricular dysfunction and ejection fraction of about 30%, and  some very  high-grade residual disease in the right coronary artery.  We  placed a 30 cc Datascope in the lumen by the right femoral artery.   We then approached the left anterior descending artery.  The patient had  already been given heparin and aspirin, and we gave additional heparin bolus  at 200 seconds, and we gave her 300 mg of Plavix and we gave a bolus of  Integrilin infusion.  We used the JR-4 6-French guiding catheter with side  holes.  We initially tried to cross with a loose wire, but this was  unsuccessful.  We were able to cross the totally occluded LAD with a PT  Graphics wire.  We dilated with 2-0 x15 mm Maverick, performing three  inflations to 8 atmospheres for 30 seconds.  The lesion involved the  diagonal branch, and we were very reluctant to risk compromising the  diagonal branch since this was  not threatened by the infarct.  For this  reason, we decided to use a cutting balloon, and we used a 2.5 x50 mm  cutting balloon, and performed four inflations up to 8 atmospheres for 30  seconds.  This gave a quite satisfactory result with TIMI 3 flow down the  vessel.  Prior to using the cutting balloon, I asked Dr. Riley Kill and Dr.  Chales Abrahams for further opinions regarding options for treatment.  Because of  multivessel disease, we considered bypass surgery once we stabilized the LAD  lesion.  We decided to proceed with the cutting balloon and see what kind of  result we had, and since we got a good result, we decided to proceed with  treatment of the right coronary artery.  We felt the right coronary artery  represented a second ruptured plaque.  We did not feel it was safe to stage  this procedure until later.  We used a 6-French JR-4 guiding catheter, and a  short BMW wire and crossed the lesions in the mid and distal right coronary  artery without difficulty.  We pre dilated with a 3.0 x20 mm  maverick  performing one inflation up to 8 atmospheres for 30 seconds at each lesion.  We then  stented the distal lesion with a 3.0 x20 mm Taxus stent, performing  this with one inflation of 16 atmospheres for 30 seconds.  We then deployed  a 3.0 x12 mm Taxus stent in the mid vessel, deploying this with one  inflation at 16 atmospheres for 30 seconds.  We then post-dilated the distal  stent with a 3.25 x15 mm Quantum Maverick performing one inflation to 18  atmospheres for 30 seconds.  Repeat diagnostic studies were then performed  with a guiding catheter.   We then elected to place a Swan-Ganz catheter via the right femoral vein to  an 8-French venous sheath.   The patient was quite sick, but he tolerated the procedure quite well, and  left the laboratory in stable condition.   HEMODYNAMIC DATA:  The right pressure was 4 mean, the pulmonary artery pressure was 29/17 with  a mean of 21.  Pulmonary wedge pressure was 15 mean.  Aortic pressure was  108/78 with a mean of 92.  Left ventricular pressure was 108/6.   RESULTS:  The left main coronary artery:  The left main coronary artery is free of  significant disease.   Left anterior descending artery:  The left anterior descending artery gave  rise to a large diagonal branch, and then was completely occluded.  There is  70% ostial stenosis in the diagonal branch.   Circumflex artery:  The circumflex artery gave rise to a first early  marginal branch, and then an AV branch was terminated in the posterolateral  branch.  There was 50% narrowing in one of the sub-branches of the marginal  branch.  There was 70-80% stenosis in the mid circumflex artery.   Right coronary artery:  The right coronary artery was a large, dominant  vessel that gave rise to a conus branch, right ventricular branch, a  posterior descending branch, and three posterolateral branches.  There was  irregularity in the proximal right coronary artery.  There was a 90% stenosis in the mid right coronary artery and tandem 90-95% stenosis in the  distal right  coronary artery.   LEFT VENTRICULOGRAM:  The left ventriculogram performed in the RAO  projection showed akinesis of the anterolateral wall and akinesis of the  apex.  The inferior wall was hypokinetic.  The estimated ejection fraction  was 30%.   DISTAL AORTOGRAM:  A distal aortogram was performed which showed patent  renal artery.  There was 70% stenosis in the left iliac vessel.   Following cutting balloon angioplasty of the lesion in the proximal LAD, the  stenosis improved from 100% to 30% and the flow improved from TIMI 0 to TIMI  3 flow.  We also dilated the distal lesion in the mid vessel from 70% to  less than 10%.   The lesion in the mid right coronary artery was initially 90%, and following  stenting, this improved to 0%.   The lesions in the distal right coronary artery initially were 90-95% and  following stenting, these improved to 0%.   The patient had the onset of continuous symptoms at 7:30 this morning, and  presented to our regional office at 1530 from which he was brought by [facility] our hospital.  The first balloon inflation established reperfusion  at 17:38.  This gave a reperfusion time of 10 hours and 8 minutes, and a  total balloon time of 2 hours, 8 minutes.   CONCLUSION:  1. Coronary artery disease, status post remote diaphragmatic wall infarction     treated with percutaneous coronary intervention, and acute anterior wall     infarction with total occlusion of the proximal LAD, 70% narrowing in the     first diagonal branch LAD, 70-80% stenosis in the mid circumflex artery,     90% stenosis in the mid right coronary artery, and 95% stenosis in the     distal right coronary artery with anterolateral wall and apical wall     akinesis and anterior wall hypokinesis, and an estimated ejection     fraction of 30%.  2. Placement of an intraaortic balloon pump.  3. Cutting balloon angioplasty of the proximal right coronary artery,     proximal left  anterior descending lesion with improvement of percent of     narrowing from 100% to 30%, and improvement in the flow from TIMI 0 to     TIMI 3 flow.  4. Successful cutting balloon angioplasty of the lesion in the mid left     anterior descending artery with improvement in percent of narrowing from     70% to less than 10%.  5. Successful stenting of the lesion in the mid right coronary with     improvement of percent of narrowing from 90% to 0%.  6. Successful stenting of the lesion in the distal right coronary artery     with improvement in percent of narrowing from 95% to 0.   DISPOSITION:  The patient sent to unit for further observation.  We will  plan to take a re-look at the left anterior descending next week and decide  if any more definitive therapy is needed.                                               Charlies Constable, M.D. [facility]    BB/MEDQ  D:  06/17/2002  T:  06/18/2002  Job:  [id number]

## 2010-08-23 NOTE — Assessment & Plan Note (Signed)
[facility]                       [place] CARDIOLOGY OFFICE NOTE   VERLE, WHEELING                      MRN:          [id number]  DATE:07/20/2006                            DOB:          1945/05/15    [name] was seen today at the request of Dr. Felecia Shelling.  He has known  coronary artery disease.  The patient's coronary risk factors include  hypertension, family history of coronary disease and hyperlipidemia.   The patient has had previous angioplasty of the LAD and RCA in 1998.  He  has not had a Myoview since 2004.  Even at that time he had inferior  wall ischemia and Dr. Dorethea Clan chose to treat him medically.   There is also a history of moderate circumflex disease.   In talking to the patient, he denies exertional chest pain.   His 10-point review of systems is remarkable for some chronic lower back  pain.  He has not had any significant PND or orthopnea, no palpitations,  no syncope.   The patient's medications include:  1. An aspirin a day.  2. Zocor 40 mg a day.  3. Toprol 50 mg a day.  4. Lisinopril 20 mg a day.   Family History:  father with premature CAD   PAST MEDICAL HISTORY:  Remarkable for a history of cocaine abuse.  He  continues to smoke marijuana but does not smoke cigarettes.  He has not  done any other drugs.  He has hypertension, hyperlipidemia.   The patient lives in [place].  He has been widowed for about a year.  He has grown children.  He is on disability for his heart although I do  not see documentation for this.  He is otherwise sedentary.   PHYSICAL EXAMINATION:  VITAL SIGNS:  The patient's exam is remarkable  for a pressure of 130/80, pulse 76 and regular.  HEENT:  Normal.  NECK:  Carotids normal without bruits.  LUNGS:  Clear.  CARDIAC:  There is an S1, S2, with normal heart tones.  ABDOMEN:  Benign.  EXTREMITIES:  Lower extremities with intact pulses, no edema.  NEUROLOGIC:  Nonfocal.  SKIN:   Warm and dry.   The patient's EKG at baseline shows T-wave inversions in the lateral  leads.   IMPRESSION:  [name] is not having symptoms of chest pain or  shortness of breath.  However, he has known three-vessel disease and has  not had a perfusion study in over 4 years.  His last one in June 2004  was also positive with inferior ischemia.  I told the patient I would  prefer him to have a stress Myoview study to risk-stratify him.   In regard to his risk factors, hypertension seems well-controlled on his  current dose of beta blockers and lisinopril.   Dr. Felecia Shelling has been following his cholesterol.  The last LDL I have on  the chart from April 2007 showed an LDL of 31, which is excellent.   The patient is taking an aspirin a day in regard to anticoagulation.   His 10-point review of systems is negative and  I suspect that as long as  his Myoview is not high-risk, we can continue to treat him medically.   Further recommendations will be based on the results of his stress test.   I was pleased that he came back for a follow-up since he does have known  three-vessel disease and needs further evaluation.     Noralyn Pick. Brian Emms, MD, [facility]  Electronically Signed    PCN/MedQ  DD: 07/20/2006  DT: 07/20/2006  Job #: [id number]

## 2010-08-23 NOTE — Group Therapy Note (Signed)
   [name], [name]                         ACCOUNT NO.:  [id number]   MEDICAL RECORD NO.:  [id number]                   PATIENT TYPE:  INP   LOCATION:  4703                                 FACILITY:   PHYSICIAN:  Vida Roller, M.D.                DATE OF BIRTH:  Dec 17, 1945   DATE OF PROCEDURE:  09/12/2002  DATE OF DISCHARGE:                                    STRESS TEST   PROCEDURE:  Exercise Cardiolite study.   INDICATIONS:  [name] is a 65 year old male with known coronary artery  disease, status post two myocardial infarctions.  Most recently he had an ST  elevation and myocardial infarction requiring percutaneous intervention of  his LAD and RCA with residual ejection fraction of 40%.  He does complain of  mild dyspnea on exertion.  Of note, he did take all of his medications this  morning including his Toprol as instructed.   BASELINE DATA:  EKG shows sinus rhythm at 51 beats per minute with  incomplete right bundle branch block.  Little change from EKG July 08, 2002.  Blood pressure is 132/72.  Patient exercised for a total of 8 minutes and 8  seconds to 7.0 mets and Bruce protocol stage II.  Stage II was held  secondary to fatigue.  Maximum heart rate achieved was 146 beats per minute  which is 89% of predicted maximum.  Maximum blood pressure was 150/90.  EKG  showed no ischemic changes and few PVCs.   Final images and results are pending review.     Amy Mercy Riding, P.A. [facility]                     Vida Roller, M.D.    AB/MEDQ  D:  09/12/2002  T:  09/12/2002  Job:  [id number]

## 2010-09-18 ENCOUNTER — Ambulatory Visit (INDEPENDENT_AMBULATORY_CARE_PROVIDER_SITE_OTHER): Payer: PRIVATE HEALTH INSURANCE | Admitting: *Deleted

## 2010-09-18 DIAGNOSIS — I2699 Other pulmonary embolism without acute cor pulmonale: Secondary | ICD-10-CM

## 2010-09-18 DIAGNOSIS — Z7901 Long term (current) use of anticoagulants: Secondary | ICD-10-CM

## 2010-09-18 LAB — POCT INR: INR: 2.4

## 2010-10-16 ENCOUNTER — Ambulatory Visit (INDEPENDENT_AMBULATORY_CARE_PROVIDER_SITE_OTHER): Payer: PRIVATE HEALTH INSURANCE | Admitting: *Deleted

## 2010-10-16 DIAGNOSIS — I2699 Other pulmonary embolism without acute cor pulmonale: Secondary | ICD-10-CM

## 2010-10-16 DIAGNOSIS — Z7901 Long term (current) use of anticoagulants: Secondary | ICD-10-CM

## 2010-10-16 LAB — POCT INR: INR: 3.5

## 2010-11-13 ENCOUNTER — Encounter: Payer: PRIVATE HEALTH INSURANCE | Admitting: *Deleted

## 2010-11-14 ENCOUNTER — Ambulatory Visit (INDEPENDENT_AMBULATORY_CARE_PROVIDER_SITE_OTHER): Payer: PRIVATE HEALTH INSURANCE | Admitting: *Deleted

## 2010-11-14 DIAGNOSIS — I2699 Other pulmonary embolism without acute cor pulmonale: Secondary | ICD-10-CM

## 2010-11-14 DIAGNOSIS — Z7901 Long term (current) use of anticoagulants: Secondary | ICD-10-CM

## 2010-11-14 LAB — POCT INR: INR: 2.9

## 2010-11-29 ENCOUNTER — Other Ambulatory Visit: Payer: Self-pay | Admitting: Cardiology

## 2010-12-10 ENCOUNTER — Encounter (HOSPITAL_COMMUNITY): Payer: Self-pay | Admitting: Emergency Medicine

## 2010-12-10 ENCOUNTER — Emergency Department (HOSPITAL_COMMUNITY): Payer: PRIVATE HEALTH INSURANCE

## 2010-12-10 ENCOUNTER — Other Ambulatory Visit: Payer: Self-pay

## 2010-12-10 ENCOUNTER — Emergency Department (HOSPITAL_COMMUNITY)
Admission: EM | Admit: 2010-12-10 | Discharge: 2010-12-10 | Disposition: A | Discharge status: Home / Self Care | Payer: PRIVATE HEALTH INSURANCE | Attending: Emergency Medicine | Admitting: Emergency Medicine

## 2010-12-10 DIAGNOSIS — Y92009 Unspecified place in unspecified non-institutional (private) residence as the place of occurrence of the external cause: Secondary | ICD-10-CM | POA: Insufficient documentation

## 2010-12-10 DIAGNOSIS — Z7901 Long term (current) use of anticoagulants: Secondary | ICD-10-CM | POA: Insufficient documentation

## 2010-12-10 DIAGNOSIS — Z7982 Long term (current) use of aspirin: Secondary | ICD-10-CM | POA: Insufficient documentation

## 2010-12-10 DIAGNOSIS — Z87891 Personal history of nicotine dependence: Secondary | ICD-10-CM | POA: Insufficient documentation

## 2010-12-10 DIAGNOSIS — I252 Old myocardial infarction: Secondary | ICD-10-CM | POA: Insufficient documentation

## 2010-12-10 DIAGNOSIS — Z951 Presence of aortocoronary bypass graft: Secondary | ICD-10-CM | POA: Insufficient documentation

## 2010-12-10 DIAGNOSIS — S20219A Contusion of unspecified front wall of thorax, initial encounter: Secondary | ICD-10-CM | POA: Insufficient documentation

## 2010-12-10 DIAGNOSIS — T7411XA Adult physical abuse, confirmed, initial encounter: Secondary | ICD-10-CM | POA: Insufficient documentation

## 2010-12-10 DIAGNOSIS — IMO0002 Reserved for concepts with insufficient information to code with codable children: Secondary | ICD-10-CM | POA: Insufficient documentation
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
## 2010-12-10 NOTE — ED Notes (Signed)
Patient with no complaints at this time. Respirations even and unlabored. Skin warm/dry. Discharge instructions reviewed with patient at this time. Patient given opportunity to voice concerns/ask questions. Patient discharged at this time and left Emergency Department with steady gait.   

## 2010-12-12 ENCOUNTER — Ambulatory Visit (INDEPENDENT_AMBULATORY_CARE_PROVIDER_SITE_OTHER): Payer: PRIVATE HEALTH INSURANCE | Admitting: *Deleted

## 2010-12-12 DIAGNOSIS — I2699 Other pulmonary embolism without acute cor pulmonale: Secondary | ICD-10-CM

## 2010-12-12 DIAGNOSIS — Z7901 Long term (current) use of anticoagulants: Secondary | ICD-10-CM

## 2010-12-12 LAB — POCT INR: INR: 1.6

## 2010-12-25 ENCOUNTER — Other Ambulatory Visit: Payer: Self-pay | Admitting: Cardiology

## 2011-01-02 ENCOUNTER — Ambulatory Visit (INDEPENDENT_AMBULATORY_CARE_PROVIDER_SITE_OTHER): Payer: PRIVATE HEALTH INSURANCE | Admitting: *Deleted

## 2011-01-02 DIAGNOSIS — Z7901 Long term (current) use of anticoagulants: Secondary | ICD-10-CM

## 2011-01-02 DIAGNOSIS — I2699 Other pulmonary embolism without acute cor pulmonale: Secondary | ICD-10-CM

## 2011-01-02 LAB — POCT INR: INR: 2.3

## 2011-01-30 ENCOUNTER — Ambulatory Visit (INDEPENDENT_AMBULATORY_CARE_PROVIDER_SITE_OTHER): Payer: PRIVATE HEALTH INSURANCE | Admitting: *Deleted

## 2011-01-30 DIAGNOSIS — I2699 Other pulmonary embolism without acute cor pulmonale: Secondary | ICD-10-CM

## 2011-01-30 DIAGNOSIS — Z7901 Long term (current) use of anticoagulants: Secondary | ICD-10-CM

## 2011-01-30 LAB — POCT INR: INR: 3.1

## 2011-02-03 ENCOUNTER — Ambulatory Visit (INDEPENDENT_AMBULATORY_CARE_PROVIDER_SITE_OTHER): Payer: PRIVATE HEALTH INSURANCE | Admitting: Adult Health

## 2011-02-03 ENCOUNTER — Encounter: Payer: Self-pay | Admitting: Adult Health

## 2011-02-03 DIAGNOSIS — I2699 Other pulmonary embolism without acute cor pulmonale: Secondary | ICD-10-CM

## 2011-02-03 DIAGNOSIS — I2510 Atherosclerotic heart disease of native coronary artery without angina pectoris: Secondary | ICD-10-CM

## 2011-02-03 DIAGNOSIS — I10 Essential (primary) hypertension: Secondary | ICD-10-CM

## 2011-02-03 NOTE — Progress Notes (Signed)
HPI: [name] is a pleasant 65 y/o patient of Dr.Rothbart we are following for known history of CAD with CABG in 2001, ischemic CM with EF of 35%-40%, PE on coumadin followed by our clinic, IMI, hypertension, and dyslipidemia (labs followed by [name]).  He comes today with compaints of incisional pain from sternotomy with a "weird feeling sometimes. " He has a large keloid sternotomy scar that is very thick at its base. He says that it sometimes swells and pulls. He denies overt chest pain, shortness of breath or swelling. He walks everyday.  Allergies  Allergen Reactions  . Lisinopril     Mouth and tongue swells    Current Outpatient Prescriptions  Medication Sig Dispense Refill  . aspirin 81 MG tablet Take 81 mg by mouth daily.        Marland Kitchen ibuprofen (ADVIL,MOTRIN) 200 MG tablet Take 200 mg by mouth as needed.        . metoprolol (TOPROL-XL) 50 MG 24 hr tablet TAKE 1.5 TABLETS (75 MG TOTAL) BY MOUTH DAILY.  45 tablet  3  . nitroGLYCERIN (NITROSTAT) 0.4 MG SL tablet Place 0.4 mg under the tongue. Take 1 tablet under tongue at onset of chest pain; you may repeat every 5 minutes for up to 3 doses.       . simvastatin (ZOCOR) 40 MG tablet TAKE 1 TABLET BY MOUTH EVERY EVENING  30 tablet  5  . traMADol (ULTRAM) 50 MG tablet Take 50 mg by mouth 2 (two) times daily. For pain. Patient takes every morning but does not take every evening      . warfarin (COUMADIN) 5 MG tablet Take 5 mg by mouth daily. Take 2.5mg  M,W,F and take 5mg  all other days as directed by Anticoagulation clinic.      Marland Kitchen docusate sodium (COLACE) 100 MG capsule Take 100 mg by mouth as needed.          Past Medical History  Diagnosis Date  . Myocardial infarction     Past Surgical History  Procedure Date  . Cardiac catheterization 06/2002  . Ptca 06/1996    LAD & RCA  . Coronary artery bypass graft 03/18/2010    LIMA-LAD, SVG to diagonal, OM1 & OM2    ZOX:WRUEAV of systems complete and found to be negative unless listed  above PHYSICAL EXAM BP 131/78  Pulse 61  Resp 18  Wt 97.124 kg (214 lb 1.9 oz)  General: Well developed, well nourished, in no acute distress Head: Eyes PERRLA, No xanthomas.   Normal cephalic and atramatic  Lungs: Clear bilaterally to auscultation and percussion. Heart: HRRR S1 S2, without MRG.  Pulses are 2+ & equal.            No carotid bruit. No JVD.  No abdominal bruits. No femoral bruits. Abdomen: Bowel sounds are positive, abdomen soft and non-tender without masses or                  Hernia's noted. Msk:  Back normal, normal gait. Normal strength and tone for age. Large keloid scar in sternotomy incision area. No bleeding or erythema. Extremities: No clubbing, cyanosis or edema.  DP +1 Neuro: Alert and oriented X 3. Psych:  Good affect, responds appropriately     ASSESSMENT AND PLAN

## 2011-02-03 NOTE — Assessment & Plan Note (Signed)
He will continue in our coumadin clinic. I have advised him that if he is having symptoms that worry him, to express them to [facility] so that we can see him that day.

## 2011-02-03 NOTE — Assessment & Plan Note (Signed)
He is doing well without cardiac complaints or use of NTG since seen last. He remains active and wants to lose some weight.  He is compliant with medications and staying off high cholesterol foods for the most part.  Will see him in 6 months. He is advised to call us for any new problems.

## 2011-02-03 NOTE — Patient Instructions (Signed)
Your physician recommends that you continue on your current medications as directed. Please refer to the Current Medication list given to you today.  Your physician recommends that you schedule a follow-up appointment in: 6 months  

## 2011-02-03 NOTE — Assessment & Plan Note (Signed)
Well controlled.  No changes in his medication regimen.

## 2011-02-07 ENCOUNTER — Other Ambulatory Visit: Payer: Self-pay | Admitting: Adult Health

## 2011-02-10 ENCOUNTER — Telehealth: Payer: Self-pay | Admitting: Adult Health

## 2011-02-10 NOTE — Telephone Encounter (Signed)
PT NEEDS WARFRIN 5 MG  AND TRAMADOL 50MG  CALLED IN TO [facility]  PT IS OUT OF COUMADIN

## 2011-02-11 MED ORDER — WARFARIN SODIUM 5 MG PO TABS: ORAL_TABLET | ORAL | Status: DC

## 2011-02-11 NOTE — Telephone Encounter (Signed)
RX sent to [facility] for refill./LV

## 2011-02-26 ENCOUNTER — Ambulatory Visit (INDEPENDENT_AMBULATORY_CARE_PROVIDER_SITE_OTHER): Payer: PRIVATE HEALTH INSURANCE | Admitting: *Deleted

## 2011-02-26 DIAGNOSIS — Z7901 Long term (current) use of anticoagulants: Secondary | ICD-10-CM

## 2011-02-26 DIAGNOSIS — I2699 Other pulmonary embolism without acute cor pulmonale: Secondary | ICD-10-CM

## 2011-02-26 LAB — POCT INR: INR: 2.4

## 2011-03-24 ENCOUNTER — Other Ambulatory Visit: Payer: Self-pay | Admitting: Cardiology

## 2011-03-26 ENCOUNTER — Ambulatory Visit (INDEPENDENT_AMBULATORY_CARE_PROVIDER_SITE_OTHER): Payer: PRIVATE HEALTH INSURANCE | Admitting: *Deleted

## 2011-03-26 DIAGNOSIS — I2699 Other pulmonary embolism without acute cor pulmonale: Secondary | ICD-10-CM

## 2011-03-26 DIAGNOSIS — Z7901 Long term (current) use of anticoagulants: Secondary | ICD-10-CM

## 2011-03-26 LAB — POCT INR: INR: 2.1

## 2011-04-23 ENCOUNTER — Ambulatory Visit (INDEPENDENT_AMBULATORY_CARE_PROVIDER_SITE_OTHER): Payer: PRIVATE HEALTH INSURANCE | Admitting: *Deleted

## 2011-04-23 DIAGNOSIS — I2699 Other pulmonary embolism without acute cor pulmonale: Secondary | ICD-10-CM

## 2011-04-23 DIAGNOSIS — Z7901 Long term (current) use of anticoagulants: Secondary | ICD-10-CM

## 2011-04-23 LAB — POCT INR: INR: 2.7

## 2011-06-04 ENCOUNTER — Ambulatory Visit (INDEPENDENT_AMBULATORY_CARE_PROVIDER_SITE_OTHER): Payer: Medicare Other | Admitting: *Deleted

## 2011-06-04 DIAGNOSIS — Z7901 Long term (current) use of anticoagulants: Secondary | ICD-10-CM

## 2011-06-04 DIAGNOSIS — I2699 Other pulmonary embolism without acute cor pulmonale: Secondary | ICD-10-CM

## 2011-06-04 LAB — POCT INR: INR: 3.2

## 2011-07-03 ENCOUNTER — Other Ambulatory Visit: Payer: Self-pay | Admitting: Cardiology

## 2011-07-16 ENCOUNTER — Ambulatory Visit (INDEPENDENT_AMBULATORY_CARE_PROVIDER_SITE_OTHER): Payer: Medicare Other | Admitting: *Deleted

## 2011-07-16 DIAGNOSIS — Z7901 Long term (current) use of anticoagulants: Secondary | ICD-10-CM

## 2011-07-16 DIAGNOSIS — I2699 Other pulmonary embolism without acute cor pulmonale: Secondary | ICD-10-CM

## 2011-07-16 LAB — POCT INR: INR: 2.6

## 2011-08-04 ENCOUNTER — Encounter: Payer: Self-pay | Admitting: Adult Health

## 2011-08-04 ENCOUNTER — Ambulatory Visit (INDEPENDENT_AMBULATORY_CARE_PROVIDER_SITE_OTHER): Payer: Medicare Other | Admitting: Adult Health

## 2011-08-04 VITALS — BP 131/83 | HR 60 | Resp 18 | Ht 66.0 in | Wt 222.0 lb

## 2011-08-04 DIAGNOSIS — I2699 Other pulmonary embolism without acute cor pulmonale: Secondary | ICD-10-CM

## 2011-08-04 DIAGNOSIS — I2589 Other forms of chronic ischemic heart disease: Secondary | ICD-10-CM

## 2011-08-04 DIAGNOSIS — Z7901 Long term (current) use of anticoagulants: Secondary | ICD-10-CM

## 2011-08-04 DIAGNOSIS — I10 Essential (primary) hypertension: Secondary | ICD-10-CM

## 2011-08-04 DIAGNOSIS — I255 Ischemic cardiomyopathy: Secondary | ICD-10-CM

## 2011-08-04 NOTE — Assessment & Plan Note (Signed)
He has had not complaints of bleeding and is followed in our coumadin clinic. I have talked with him about the need for colonoscopy secondary to age and the fact that he has never had one in the past that he can remember.  He is to see Dr. Felecia Shelling about following up to schedule this. Hemoccult cards are given to him.

## 2011-08-04 NOTE — Assessment & Plan Note (Signed)
Well controlled at present. No changes in medications. 

## 2011-08-04 NOTE — Progress Notes (Signed)
   HPI: [name] is a 66 y/o patient of Dr. Dietrich Pates we are following of ongoing assessment and treatment of CAD, CABG in 2001, ischemic CM with EF of 35%-40%, PE on coumadin and is followed by our coumadin clinic. He is without complaint today. He denies any chest discomfort from sternotomy or DOE or bleeding issues. He has not been very active over the last few months since being seen last causing some weight gain.   Allergies  Allergen Reactions  . Lisinopril     Mouth and tongue swells    Current Outpatient Prescriptions  Medication Sig Dispense Refill  . aspirin 81 MG tablet Take 81 mg by mouth daily.        . metoprolol (TOPROL-XL) 50 MG 24 hr tablet TAKE 1.5 TABLETS (75 MG TOTAL) BY MOUTH DAILY.  45 tablet  3  . nitroGLYCERIN (NITROSTAT) 0.4 MG SL tablet Place 0.4 mg under the tongue. Take 1 tablet under tongue at onset of chest pain; you may repeat every 5 minutes for up to 3 doses.       . simvastatin (ZOCOR) 40 MG tablet TAKE 1 TABLET BY MOUTH EVERY EVENING  30 tablet  5  . traMADol (ULTRAM) 50 MG tablet Take 50 mg by mouth 2 (two) times daily. For pain. Patient takes every morning but does not take every evening      . warfarin (COUMADIN) 5 MG tablet As directed by Coumadin Clinic  45 tablet  2    Past Medical History  Diagnosis Date  . Myocardial infarction     Past Surgical History  Procedure Date  . Cardiac catheterization 06/2002  . Ptca 06/1996    LAD & RCA  . Coronary artery bypass graft 03/18/2010    LIMA-LAD, SVG to diagonal, OM1 & OM2    ZOX:WRUEAV of systems complete and found to be negative unless listed above PHYSICAL EXAM BP 131/83  Pulse 60  Resp 18  Ht 5\' 6"  (1.676 m)  Wt 222 lb (100.699 kg)  BMI 35.83 kg/m2  General: Well developed, well nourished, in no acute distress Head: Eyes PERRLA, No xanthomas.   Normal cephalic and atramautic, wears glasess  Lungs: Clear bilaterally to auscultation and percussion. Heart: HRRR S1 S2, without MRG.  Pulses  are 2+ & equal.            No carotid bruit. No JVD.  No abdominal bruits. No femoral bruits. Abdomen: Bowel sounds are positive, abdomen soft and non-tender without masses or                  Hernia's noted. Msk:  Back normal, normal gait. Normal strength and tone for age. Extremities: No clubbing, cyanosis or edema.  DP +1 Neuro: Alert and oriented X 3. Psych:  Good affect, responds appropriately EKG: NSR with rate of 60 bpm. T-wave flattening in the inferior leads, with T-wave inversion in the lateral leads. Rate of 60 bpm.   ASSESSMENT AND PLAN

## 2011-08-04 NOTE — Assessment & Plan Note (Signed)
He is without complaint today. He has not been active since last seen. He has a gained a little weight but no evidence of fluid retention. He will have a follow-up Echo in 6 months. Continue current medications.

## 2011-08-04 NOTE — Patient Instructions (Signed)
**Note De-Identified Fredric Slabach Obfuscation** Your provider recommends that you complete 3 hemoccult cards and return to this office  Your physician recommends that you continue on your current medications as directed. Please refer to the Current Medication list given to you today.  Your physician recommends that you schedule a follow-up appointment in: 6 months  REMINDER: discuss colonoscopy with Dr. Felecia Shelling, your primary care doctor.

## 2011-08-05 ENCOUNTER — Other Ambulatory Visit: Payer: Self-pay | Admitting: Cardiology

## 2011-08-06 ENCOUNTER — Encounter (INDEPENDENT_AMBULATORY_CARE_PROVIDER_SITE_OTHER): Payer: Medicare Other

## 2011-08-06 DIAGNOSIS — Z7901 Long term (current) use of anticoagulants: Secondary | ICD-10-CM

## 2011-08-07 ENCOUNTER — Encounter: Payer: Self-pay | Admitting: *Deleted

## 2011-08-27 ENCOUNTER — Ambulatory Visit (INDEPENDENT_AMBULATORY_CARE_PROVIDER_SITE_OTHER): Payer: Medicare Other | Admitting: *Deleted

## 2011-08-27 DIAGNOSIS — I2699 Other pulmonary embolism without acute cor pulmonale: Secondary | ICD-10-CM

## 2011-08-27 DIAGNOSIS — Z7901 Long term (current) use of anticoagulants: Secondary | ICD-10-CM

## 2011-08-27 LAB — POCT INR: INR: 2.9

## 2011-08-28 ENCOUNTER — Telehealth: Payer: Self-pay

## 2011-08-28 NOTE — Telephone Encounter (Signed)
Pt received letter, referred by Dr. Felecia Shelling for screening colonoscopy. He is on coumadin. OV with Tana Coast, PA on 09/17/2011 at 10:00 AM.

## 2011-09-17 ENCOUNTER — Ambulatory Visit (INDEPENDENT_AMBULATORY_CARE_PROVIDER_SITE_OTHER): Payer: PRIVATE HEALTH INSURANCE | Admitting: Gastroenterology

## 2011-09-17 VITALS — BP 130/74 | HR 62 | Temp 97.9°F | Ht 66.0 in | Wt 225.4 lb

## 2011-09-17 DIAGNOSIS — Z1211 Encounter for screening for malignant neoplasm of colon: Secondary | ICD-10-CM

## 2011-09-18 NOTE — Progress Notes (Signed)
Patient not seen. Not due for TCS yet. Letter sent to referring provider.

## 2011-09-24 ENCOUNTER — Ambulatory Visit (INDEPENDENT_AMBULATORY_CARE_PROVIDER_SITE_OTHER): Payer: PRIVATE HEALTH INSURANCE | Admitting: *Deleted

## 2011-09-24 DIAGNOSIS — Z7901 Long term (current) use of anticoagulants: Secondary | ICD-10-CM

## 2011-09-24 DIAGNOSIS — I2699 Other pulmonary embolism without acute cor pulmonale: Secondary | ICD-10-CM

## 2011-09-24 LAB — POCT INR: INR: 3.6

## 2011-10-27 ENCOUNTER — Ambulatory Visit (INDEPENDENT_AMBULATORY_CARE_PROVIDER_SITE_OTHER): Payer: PRIVATE HEALTH INSURANCE | Admitting: *Deleted

## 2011-10-27 DIAGNOSIS — Z7901 Long term (current) use of anticoagulants: Secondary | ICD-10-CM

## 2011-10-27 DIAGNOSIS — I2699 Other pulmonary embolism without acute cor pulmonale: Secondary | ICD-10-CM

## 2011-10-27 LAB — POCT INR: INR: 3

## 2011-11-24 ENCOUNTER — Ambulatory Visit (INDEPENDENT_AMBULATORY_CARE_PROVIDER_SITE_OTHER): Payer: PRIVATE HEALTH INSURANCE | Admitting: *Deleted

## 2011-11-24 DIAGNOSIS — I2699 Other pulmonary embolism without acute cor pulmonale: Secondary | ICD-10-CM

## 2011-11-24 DIAGNOSIS — Z7901 Long term (current) use of anticoagulants: Secondary | ICD-10-CM

## 2011-11-24 LAB — POCT INR: INR: 3

## 2011-12-05 ENCOUNTER — Other Ambulatory Visit: Payer: Self-pay | Admitting: *Deleted

## 2011-12-05 ENCOUNTER — Other Ambulatory Visit: Payer: Self-pay | Admitting: Cardiology

## 2011-12-05 MED ORDER — WARFARIN SODIUM 5 MG PO TABS: ORAL_TABLET | ORAL | Status: DC

## 2011-12-10 IMAGING — CR DG CHEST 1V PORT
1 series · 1 of 1 positions shown · non-contrast
Comparison: Portable exam 5133 hours compared to 04/15/2010

CLINICAL DATA: Chest pain, unstable angina, prior MI and CABG,
former smoker

PORTABLE CHEST - 1 VIEW

[view not recorded]
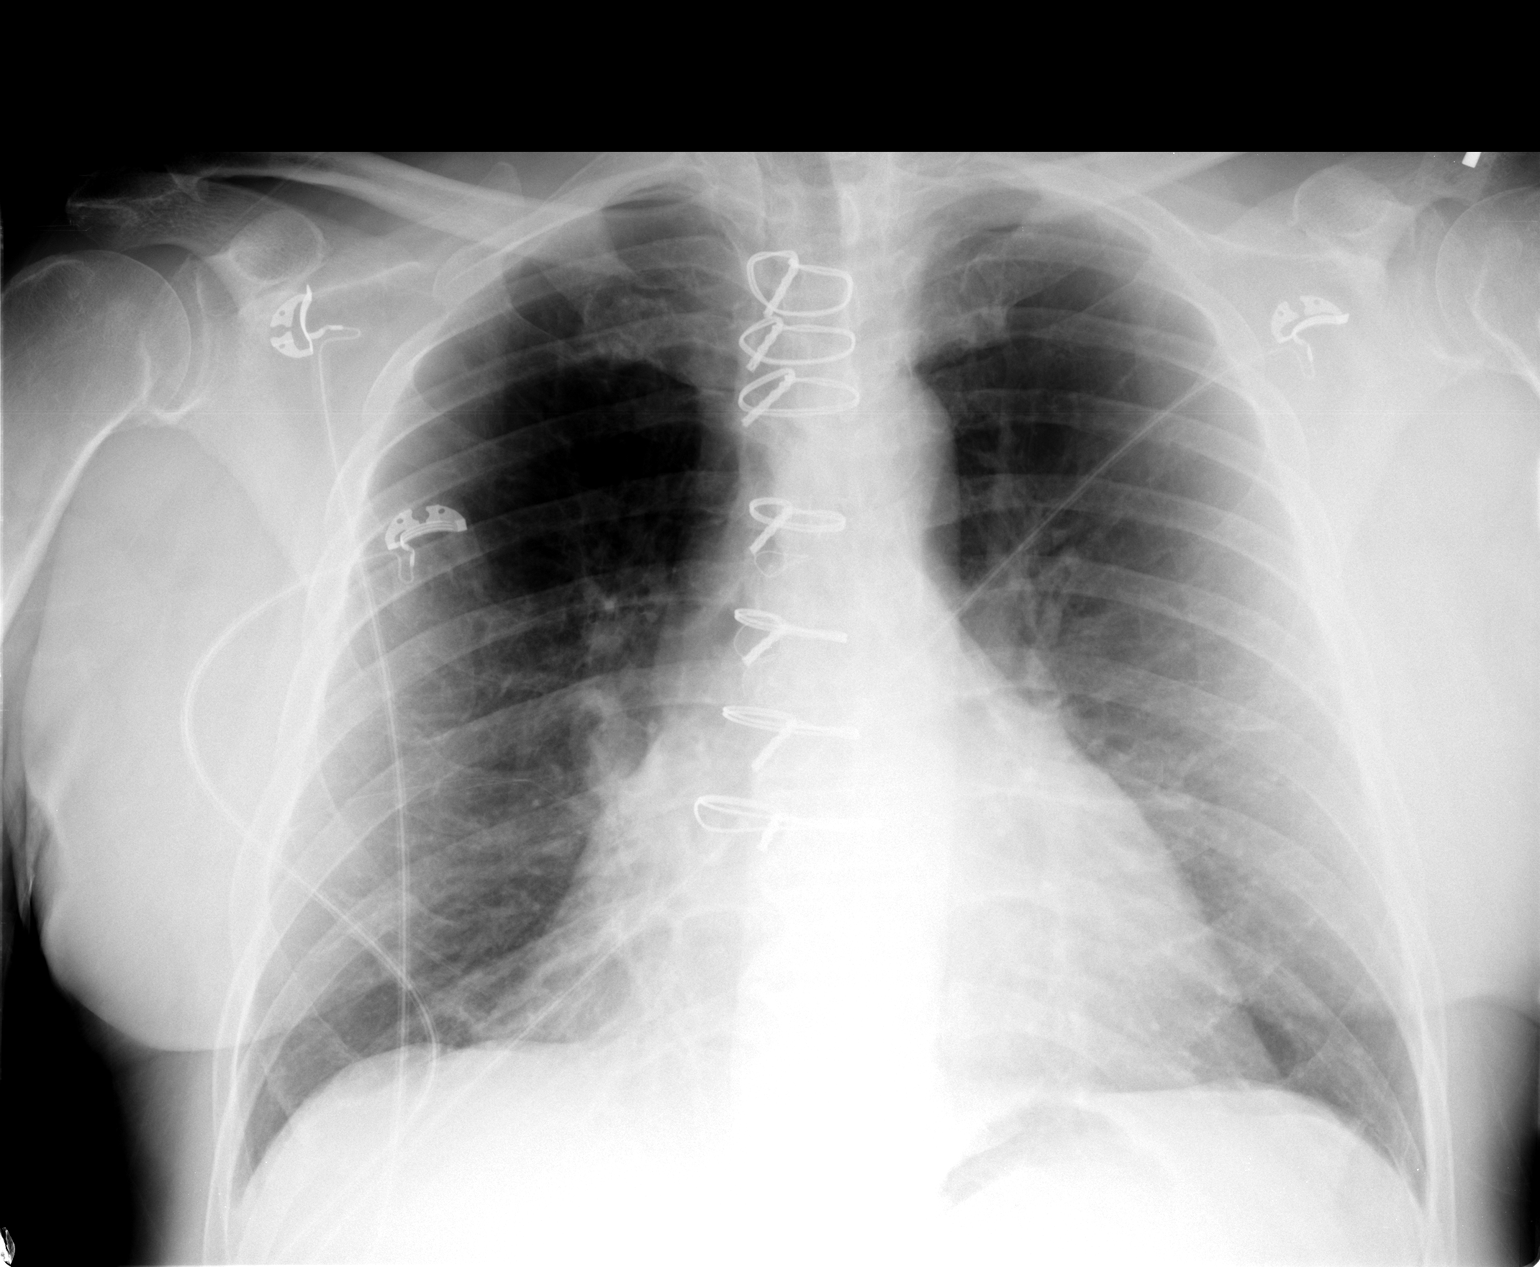

[1 of 1 positions shown; findings below may reference images not displayed]

FINDINGS: Upper normal heart size post CABG.
Mediastinal contours and pulmonary vascularity normal.
Lungs appear emphysematous but clear.
No definite pleural effusion or pneumothorax.
Osseous structures unremarkable.
IMPRESSION: Post CABG.
Emphysematous changes.
No acute abnormalities.

## 2011-12-22 ENCOUNTER — Ambulatory Visit (INDEPENDENT_AMBULATORY_CARE_PROVIDER_SITE_OTHER): Payer: PRIVATE HEALTH INSURANCE | Admitting: *Deleted

## 2011-12-22 DIAGNOSIS — I2699 Other pulmonary embolism without acute cor pulmonale: Secondary | ICD-10-CM

## 2011-12-22 DIAGNOSIS — Z7901 Long term (current) use of anticoagulants: Secondary | ICD-10-CM

## 2011-12-22 LAB — POCT INR: INR: 4.2

## 2012-01-05 ENCOUNTER — Ambulatory Visit (INDEPENDENT_AMBULATORY_CARE_PROVIDER_SITE_OTHER): Payer: PRIVATE HEALTH INSURANCE | Admitting: *Deleted

## 2012-01-05 DIAGNOSIS — I2699 Other pulmonary embolism without acute cor pulmonale: Secondary | ICD-10-CM

## 2012-01-05 DIAGNOSIS — Z7901 Long term (current) use of anticoagulants: Secondary | ICD-10-CM

## 2012-01-05 LAB — POCT INR: INR: 2.7

## 2012-01-14 ENCOUNTER — Ambulatory Visit (INDEPENDENT_AMBULATORY_CARE_PROVIDER_SITE_OTHER): Payer: Medicare Other | Admitting: Cardiology

## 2012-01-14 ENCOUNTER — Encounter: Payer: Self-pay | Admitting: Cardiology

## 2012-01-14 ENCOUNTER — Encounter: Payer: Self-pay | Admitting: *Deleted

## 2012-01-14 ENCOUNTER — Ambulatory Visit: Payer: Self-pay | Admitting: *Deleted

## 2012-01-14 VITALS — BP 128/80 | HR 70 | Ht 66.0 in | Wt 227.0 lb

## 2012-01-14 DIAGNOSIS — I2510 Atherosclerotic heart disease of native coronary artery without angina pectoris: Secondary | ICD-10-CM | POA: Insufficient documentation

## 2012-01-14 DIAGNOSIS — I10 Essential (primary) hypertension: Secondary | ICD-10-CM

## 2012-01-14 DIAGNOSIS — I2699 Other pulmonary embolism without acute cor pulmonale: Secondary | ICD-10-CM

## 2012-01-14 DIAGNOSIS — Z7901 Long term (current) use of anticoagulants: Secondary | ICD-10-CM | POA: Insufficient documentation

## 2012-01-14 DIAGNOSIS — E785 Hyperlipidemia, unspecified: Secondary | ICD-10-CM

## 2012-01-14 DIAGNOSIS — I7090 Unspecified atherosclerosis: Secondary | ICD-10-CM

## 2012-01-14 DIAGNOSIS — R0602 Shortness of breath: Secondary | ICD-10-CM

## 2012-01-14 MED ORDER — ISOSORB DINITRATE-HYDRALAZINE 20-37.5 MG PO TABS: 1.0000 | ORAL_TABLET | Freq: Three times a day (TID) | ORAL | Status: DC

## 2012-01-14 NOTE — Patient Instructions (Addendum)
Your physician recommends that you schedule a follow-up appointment in: 6 months  Your physician recommends that you return for lab work in: 1 month (you will receive a letter)  Your physician has recommended you make the following change in your medication:  1 - STOP Coumadin 2 - START BiDil 20/37.5 mg three times a day - Samples and Copay Card provided

## 2012-01-14 NOTE — Progress Notes (Deleted)
Name: [name]    DOB: [birth year]  Age: 66 y.o.  MR#: [id number]       PCP:  Avon Gully, MD      Insurance: @PAYORNAME @   CC:   No chief complaint on file.   VS BP 128/80  Pulse 70  Ht 5\' 6"  (1.676 m)  Wt 227 lb (102.967 kg)  BMI 36.64 kg/m2  SpO2 95%  Weights Current Weight  01/14/12 227 lb (102.967 kg)  09/17/11 225 lb 6.4 oz (102.241 kg)  08/04/11 222 lb (100.699 kg)    Blood Pressure  BP Readings from Last 3 Encounters:  01/14/12 128/80  09/17/11 130/74  08/04/11 131/83     Admit date:  (Not on file) Last encounter with RMR:  12/05/2011   Allergy Allergies  Allergen Reactions  . Lisinopril     Mouth and tongue swells    Current Outpatient Prescriptions  Medication Sig Dispense Refill  . aspirin 81 MG tablet Take 81 mg by mouth daily.       . metoprolol succinate (TOPROL-XL) 50 MG 24 hr tablet TAKE 1.5 TABLETS (75 MG TOTAL) BY MOUTH DAILY.  45 tablet  3  . nitroGLYCERIN (NITROSTAT) 0.4 MG SL tablet Place 0.4 mg under the tongue. Take 1 tablet under tongue at onset of chest pain; you may repeat every 5 minutes for up to 3 doses.      . simvastatin (ZOCOR) 40 MG tablet TAKE 1 TABLET BY MOUTH EVERY EVENING  30 tablet  5  . traMADol (ULTRAM) 50 MG tablet Take 50 mg by mouth 2 (two) times daily. For pain. Patient takes every morning but does not take every evening      . warfarin (COUMADIN) 5 MG tablet Take 5 mg by mouth daily. As directed by Coumadin Clinic      . DISCONTD: warfarin (COUMADIN) 5 MG tablet As directed by Coumadin Clinic  19 tablet  2    Discontinued Meds:    Medications Discontinued During This Encounter  Medication Reason  . warfarin (COUMADIN) 5 MG tablet     Patient Active Problem List  Diagnosis  . HYPERLIPIDEMIA  . HYPERTENSION  . Arteriosclerotic cardiovascular disease (ASCVD)  . Pulmonary embolism  . Chronic anticoagulation    LABS Anti-coag visit on 01/05/2012  Component Date Value  . INR 01/05/2012 2.7   Anti-coag  visit on 12/22/2011  Component Date Value  . INR 12/22/2011 4.2   Anti-coag visit on 11/24/2011  Component Date Value  . INR 11/24/2011 3.0   Anti-coag visit on 10/27/2011  Component Date Value  . INR 10/27/2011 3.0      Results for this Opt Visit:     Results for orders placed in visit on 01/05/12  POCT INR      Component Value Range   INR 2.7      EKG Orders placed in visit on 08/04/11  . EKG 12-LEAD     Prior Assessment and Plan Problem List as of 01/14/2012            Cardiology Problems   HYPERLIPIDEMIA   HYPERTENSION   Last Assessment & Plan Note   08/04/2011 Office Visit Signed 08/04/2011 11:16 AM by Jodelle Gross, NP    Well controlled at present. No changes in medications.    Arteriosclerotic cardiovascular disease (ASCVD)   Pulmonary embolism     Other   Chronic anticoagulation       Imaging: No results found.   FRS Calculation: Score not  calculated. Missing: Total Cholesterol

## 2012-01-16 NOTE — Assessment & Plan Note (Signed)
Patient has been treated for one year following pulmonary embolism that appears to be related to prior major surgery.  Warfarin will be discontinued and a d-dimer level and clinical assessment performed in one month.

## 2012-01-16 NOTE — Assessment & Plan Note (Signed)
Extremely low lipid values in 2012.  Repeat profile will be obtained at patient's next visit.

## 2012-01-16 NOTE — Assessment & Plan Note (Signed)
Patient is doing very well one year post-CABG surgery.  He cannot be treated with ACE inhibitor due to allergic reaction that included upper airway edema.  Nitrates and hydralazine will be added to his medical regimen.

## 2012-01-16 NOTE — Assessment & Plan Note (Signed)
No elevated blood pressures recorded in the past few years.  Current therapy will be continued.

## 2012-01-16 NOTE — Progress Notes (Signed)
Patient ID: [name], male   DOB: 05-Nov-1945, 66 y.o.   MRN: [id number]  HPI: Scheduled return visit for continuing assessment and treatment of hypertension, ischemic cardiomyopathy and history of pulmonary embolism requiring anticoagulation.  Pulmonary embolism occurred approximately one year ago in the month following CABG surgery.  He has done well since with essentially no cardiopulmonary symptoms.  Prior to Admission medications   Medication Sig Start Date End Date Taking? Authorizing Provider  aspirin 81 MG tablet Take 81 mg by mouth daily.    Yes Historical Provider, MD  metoprolol succinate (TOPROL-XL) 50 MG 24 hr tablet TAKE 1.5 TABLETS (75 MG TOTAL) BY MOUTH DAILY. 12/05/11  Yes Kathlen Brunswick, MD  nitroGLYCERIN (NITROSTAT) 0.4 MG SL tablet Place 0.4 mg under the tongue. Take 1 tablet under tongue at onset of chest pain; you may repeat every 5 minutes for up to 3 doses.   Yes Historical Provider, MD  simvastatin (ZOCOR) 40 MG tablet TAKE 1 TABLET BY MOUTH EVERY EVENING 07/03/11  Yes Kathlen Brunswick, MD  traMADol (ULTRAM) 50 MG tablet Take 50 mg by mouth 2 (two) times daily. For pain. Patient takes every morning but does not take every evening   Yes Historical Provider, MD  isosorbide-hydrALAZINE (BIDIL) 20-37.5 MG per tablet Take 1 tablet by mouth 3 (three) times daily. 01/14/12   Kathlen Brunswick, MD   Allergies  Allergen Reactions  . Lisinopril     Mouth and tongue swells  Past medical history, social history, and family history reviewed and updated.  ROS: Denies orthopnea, PND, pedal edema, chest pain, dyspnea on exertion, lightheadedness or syncope.  There is no melena or other evidence for GI blood loss.  All other systems reviewed and are negative.  PHYSICAL EXAM: BP 128/80  Pulse 70  Ht 5\' 6"  (1.676 m)  Wt 102.967 kg (227 lb)  BMI 36.64 kg/m2  SpO2 95%  General-Well developed; no acute distress Body habitus-overweight Neck-minimal JVD; no carotid  bruits Lungs-clear lung fields; resonant to percussion Cardiovascular-normal PMI; normal S1 and S2 Abdomen-normal bowel sounds; soft and non-tender without masses or organomegaly Musculoskeletal-No deformities, no cyanosis or clubbing Neurologic-Normal cranial nerves; symmetric strength and tone Skin-Warm, no significant lesions Extremities-distal pulses intact; trace edema; tenderness along the lateral aspect of left lower leg  ASSESSMENT AND PLAN:  [name], MD 01/16/2012 10:27 AM

## 2012-01-30 ENCOUNTER — Other Ambulatory Visit: Payer: Self-pay | Admitting: Cardiology

## 2012-02-02 ENCOUNTER — Telehealth: Payer: Self-pay | Admitting: Cardiology

## 2012-02-02 NOTE — Telephone Encounter (Signed)
Attempted to contact patient to make him aware that samples and copay card have been left for him to pick up.  Unable to leave a message.

## 2012-02-02 NOTE — Telephone Encounter (Signed)
Patient wants more samples of Bidil. / tg

## 2012-02-05 ENCOUNTER — Other Ambulatory Visit: Payer: Self-pay | Admitting: Cardiology

## 2012-02-13 ENCOUNTER — Other Ambulatory Visit: Payer: Self-pay | Admitting: Cardiology

## 2012-02-13 ENCOUNTER — Other Ambulatory Visit: Payer: Self-pay | Admitting: *Deleted

## 2012-02-13 DIAGNOSIS — R0602 Shortness of breath: Secondary | ICD-10-CM

## 2012-02-14 LAB — BRAIN NATRIURETIC PEPTIDE: Brain Natriuretic Peptide: 39.4 pg/mL (ref 0.0–100.0)

## 2012-02-14 LAB — D-DIMER, QUANTITATIVE (NOT AT ARMC): D-Dimer, Quant: 0.69 ug/mL-FEU — ABNORMAL HIGH (ref 0.00–0.48)

## 2012-02-15 ENCOUNTER — Encounter: Payer: Self-pay | Admitting: Cardiology

## 2012-03-22 ENCOUNTER — Emergency Department (HOSPITAL_COMMUNITY)
Admission: EM | Admit: 2012-03-22 | Discharge: 2012-03-22 | Disposition: A | Discharge status: Home / Self Care | Payer: PRIVATE HEALTH INSURANCE | Attending: Emergency Medicine | Admitting: Emergency Medicine

## 2012-03-22 ENCOUNTER — Encounter (HOSPITAL_COMMUNITY): Payer: Self-pay

## 2012-03-22 ENCOUNTER — Other Ambulatory Visit: Payer: Self-pay

## 2012-03-22 ENCOUNTER — Emergency Department (HOSPITAL_COMMUNITY): Payer: PRIVATE HEALTH INSURANCE

## 2012-03-22 DIAGNOSIS — I2510 Atherosclerotic heart disease of native coronary artery without angina pectoris: Secondary | ICD-10-CM | POA: Insufficient documentation

## 2012-03-22 DIAGNOSIS — Z86711 Personal history of pulmonary embolism: Secondary | ICD-10-CM | POA: Insufficient documentation

## 2012-03-22 DIAGNOSIS — R0789 Other chest pain: Secondary | ICD-10-CM | POA: Insufficient documentation

## 2012-03-22 DIAGNOSIS — R209 Unspecified disturbances of skin sensation: Secondary | ICD-10-CM | POA: Insufficient documentation

## 2012-03-22 DIAGNOSIS — R079 Chest pain, unspecified: Secondary | ICD-10-CM

## 2012-03-22 DIAGNOSIS — D689 Coagulation defect, unspecified: Secondary | ICD-10-CM | POA: Insufficient documentation

## 2012-03-22 DIAGNOSIS — Z79899 Other long term (current) drug therapy: Secondary | ICD-10-CM | POA: Insufficient documentation

## 2012-03-22 DIAGNOSIS — Z87891 Personal history of nicotine dependence: Secondary | ICD-10-CM | POA: Insufficient documentation

## 2012-03-22 DIAGNOSIS — Z7982 Long term (current) use of aspirin: Secondary | ICD-10-CM | POA: Insufficient documentation

## 2012-03-22 DIAGNOSIS — F1410 Cocaine abuse, uncomplicated: Secondary | ICD-10-CM | POA: Insufficient documentation

## 2012-03-22 DIAGNOSIS — R200 Anesthesia of skin: Secondary | ICD-10-CM

## 2012-03-22 DIAGNOSIS — I10 Essential (primary) hypertension: Secondary | ICD-10-CM | POA: Insufficient documentation

## 2012-03-22 DIAGNOSIS — E785 Hyperlipidemia, unspecified: Secondary | ICD-10-CM | POA: Insufficient documentation

## 2012-03-22 DIAGNOSIS — Z862 Personal history of diseases of the blood and blood-forming organs and certain disorders involving the immune mechanism: Secondary | ICD-10-CM | POA: Insufficient documentation

## 2012-03-22 LAB — CBC WITH DIFFERENTIAL/PLATELET
Basophils Absolute: 0 10*3/uL (ref 0.0–0.1)
Basophils Relative: 1 % (ref 0–1)
Eosinophils Absolute: 0.3 10*3/uL (ref 0.0–0.7)
Eosinophils Relative: 5 % (ref 0–5)
HCT: 48 % (ref 39.0–52.0)
Hemoglobin: 16.3 g/dL (ref 13.0–17.0)
Lymphocytes Relative: 54 % — ABNORMAL HIGH (ref 12–46)
Lymphs Abs: 3.2 10*3/uL (ref 0.7–4.0)
MCH: 29.8 pg (ref 26.0–34.0)
MCHC: 34 g/dL (ref 30.0–36.0)
MCV: 87.8 fL (ref 78.0–100.0)
Monocytes Absolute: 0.6 10*3/uL (ref 0.1–1.0)
Monocytes Relative: 10 % (ref 3–12)
Neutro Abs: 1.9 10*3/uL (ref 1.7–7.7)
Neutrophils Relative %: 31 % — ABNORMAL LOW (ref 43–77)
Platelets: 154 10*3/uL (ref 150–400)
RBC: 5.47 MIL/uL (ref 4.22–5.81)
RDW: 13.2 % (ref 11.5–15.5)
WBC: 6 10*3/uL (ref 4.0–10.5)

## 2012-03-22 LAB — BASIC METABOLIC PANEL
BUN: 13 mg/dL (ref 6–23)
CO2: 25 mEq/L (ref 19–32)
Calcium: 9.3 mg/dL (ref 8.4–10.5)
Chloride: 103 mEq/L (ref 96–112)
Creatinine, Ser: 1.29 mg/dL (ref 0.50–1.35)
GFR calc Af Amer: 66 mL/min — ABNORMAL LOW (ref >90)
GFR calc non Af Amer: 57 mL/min — ABNORMAL LOW (ref >90)
Glucose, Bld: 110 mg/dL — ABNORMAL HIGH (ref 70–99)
Potassium: 3.5 mEq/L (ref 3.5–5.1)
Sodium: 138 mEq/L (ref 135–145)

## 2012-03-22 LAB — TROPONIN I: Troponin I: <0.3 ng/mL (ref <0.30)

## 2012-03-22 MED ORDER — MORPHINE SULFATE 4 MG/ML IJ SOLN: 2.0000 mg | Freq: Once | INTRAMUSCULAR | Status: DC

## 2012-03-22 MED ORDER — PANTOPRAZOLE SODIUM 40 MG IV SOLR
40.0000 mg | Freq: Once | INTRAVENOUS | Status: AC
  Administered 2012-03-22: 40 mg via INTRAVENOUS
  Filled 2012-03-22: qty 40

## 2012-03-22 MED ORDER — SODIUM CHLORIDE 0.9 % IV SOLN
Freq: Once | INTRAVENOUS | Status: AC
  Administered 2012-03-22: 01:00:00 via INTRAVENOUS

## 2012-03-22 MED ORDER — NITROGLYCERIN 0.4 MG SL SUBL
0.4000 mg | SUBLINGUAL_TABLET | SUBLINGUAL | Status: AC | PRN
  Administered 2012-03-22 (×3): 0.4 mg via SUBLINGUAL
  Filled 2012-03-22: qty 25

## 2012-03-22 NOTE — ED Provider Notes (Signed)
History     CSN: [id number]  Arrival date & time 03/22/12  0020   First MD Initiated Contact with Patient 03/22/12 0038      Chief Complaint  Patient presents with  . Chest Pain    (Consider location/radiation/quality/duration/timing/severity/associated sxs/prior treatment) HPI  Shawnmichael L [name] is a 66 y.o. male with a h/o CAD s/p CABG who presents to the Emergency Department complaining of chest discomfort. He had left sided chest discomfort early in the day yesterday. Went to bed and felt similar discomfort. Describes it as a drawing feeling worse with movement. He also c/o swelling to the left side of his face that makes his cheek feel cool. He has taken no medicines. He denies fever, chills, nausea, vomiting, shortness of breath.  PCP Dr. Felecia Shelling Cardiology Dr. Dietrich Pates    Past Medical History  Diagnosis Date  . Arteriosclerotic cardiovascular disease (ASCVD) 1998    Inferior MI->PCI of the RCA in 1998; acute anterior MI in 06/2002->  PCI of RCA and LAD with DES x2 to RCA, residual 80% ostial D1, and 80% mid CX and EF-40%; CABG-2011, LIMA-LAD, SVG to D1, OM1 & OM2; EF of 35-40% in 07/2010  . Substance abuse     formerly cocaine  . Hyperlipidemia   . Hypertension   . Pulmonary embolism 03/2010  . Chronic anticoagulation   . Keloid     median sternotomy    Past Surgical History  Procedure Date  . Ptca 06/1996    LAD & RCA  . Coronary artery bypass graft 03/18/2010    LIMA-LAD, SVG to diagonal, OM1 & OM2    Family History  Problem Relation Age of Onset  . Cancer Mother 55    History  Substance Use Topics  . Smoking status: Former Games developer  . Smokeless tobacco: Not on file  . Alcohol Use: No      Review of Systems  Constitutional: Negative for fever.       10 Systems reviewed and are negative for acute change except as noted in the HPI.  HENT: Negative for congestion.        Facial swelling,numbness left side  Eyes: Negative for discharge and redness.   Respiratory: Negative for cough and shortness of breath.   Cardiovascular: Negative for chest pain.       Chest discomfort  Gastrointestinal: Negative for vomiting and abdominal pain.  Musculoskeletal: Negative for back pain.  Skin: Negative for rash.  Neurological: Negative for syncope, numbness and headaches.  Psychiatric/Behavioral:       No behavior change.    Allergies  Lisinopril  Home Medications   Current Outpatient Rx  Name  Route  Sig  Dispense  Refill  . ASPIRIN 81 MG PO TABS   Oral   Take 81 mg by mouth daily.          . ISOSORB DINITRATE-HYDRALAZINE 20-37.5 MG PO TABS   Oral   Take 1 tablet by mouth 3 (three) times daily.   90 tablet   6   . METOPROLOL SUCCINATE ER 50 MG PO TB24      TAKE 1.5 TABLETS (75 MG TOTAL) BY MOUTH DAILY.   45 tablet   3   . NITROGLYCERIN 0.4 MG SL SUBL   Sublingual   Place 0.4 mg under the tongue. Take 1 tablet under tongue at onset of chest pain; you may repeat every 5 minutes for up to 3 doses.         Marland Kitchen SIMVASTATIN 40 MG  PO TABS      TAKE 1 TABLET BY MOUTH EVERY EVENING   30 tablet   6   . TRAMADOL HCL 50 MG PO TABS   Oral   Take 50 mg by mouth 2 (two) times daily. For pain. Patient takes every morning but does not take every evening           BP 134/86  Pulse 74  Temp 97.7 F (36.5 C) (Oral)  Resp 14  Ht 5\' 6"  (1.676 m)  Wt 222 lb (100.699 kg)  BMI 35.83 kg/m2  SpO2 97%  Physical Exam  Nursing note and vitals reviewed. Constitutional: He appears well-developed and well-nourished.       Awake, alert, nontoxic appearance.  HENT:  Head: Normocephalic and atraumatic.  Right Ear: External ear normal.  Left Ear: External ear normal.       Poor dentition, teeth missing, caries. No obvious abscess. No appreciable facial swelling. Sensation to light touch to both sides of face equal.  Eyes: Pupils are equal, round, and reactive to light. Right eye exhibits no discharge. Left eye exhibits no discharge.   Neck: Neck supple.  Cardiovascular:       Well healed sternotomy scar.  Pulmonary/Chest: Effort normal and breath sounds normal. He exhibits no tenderness.  Abdominal: Soft. There is no tenderness. There is no rebound.  Musculoskeletal: He exhibits no tenderness.       Baseline ROM, no obvious new focal weakness.  Neurological:       Mental status and motor strength appears baseline for patient and situation.No facial asymmetry. Normal speech  Skin: No rash noted.  Psychiatric: He has a normal mood and affect.    ED Course  Procedures (including critical care time)  Results for orders placed during the hospital encounter of 03/22/12  CBC WITH DIFFERENTIAL      Component Value Range   WBC 6.0  4.0 - 10.5 K/uL   RBC 5.47  4.22 - 5.81 MIL/uL   Hemoglobin 16.3  13.0 - 17.0 g/dL   HCT 16.1  09.6 - 04.5 %   MCV 87.8  78.0 - 100.0 fL   MCH 29.8  26.0 - 34.0 pg   MCHC 34.0  30.0 - 36.0 g/dL   RDW 40.9  81.1 - 91.4 %   Platelets 154  150 - 400 K/uL   Neutrophils Relative 31 (*) 43 - 77 %   Neutro Abs 1.9  1.7 - 7.7 K/uL   Lymphocytes Relative 54 (*) 12 - 46 %   Lymphs Abs 3.2  0.7 - 4.0 K/uL   Monocytes Relative 10  3 - 12 %   Monocytes Absolute 0.6  0.1 - 1.0 K/uL   Eosinophils Relative 5  0 - 5 %   Eosinophils Absolute 0.3  0.0 - 0.7 K/uL   Basophils Relative 1  0 - 1 %   Basophils Absolute 0.0  0.0 - 0.1 K/uL  BASIC METABOLIC PANEL      Component Value Range   Sodium 138  135 - 145 mEq/L   Potassium 3.5  3.5 - 5.1 mEq/L   Chloride 103  96 - 112 mEq/L   CO2 25  19 - 32 mEq/L   Glucose, Bld 110 (*) 70 - 99 mg/dL   BUN 13  6 - 23 mg/dL   Creatinine, Ser 7.82  0.50 - 1.35 mg/dL   Calcium 9.3  8.4 - 95.6 mg/dL   GFR calc non Af Amer 57 (*) >90 mL/min  GFR calc Af Amer 66 (*) >90 mL/min  TROPONIN I      Component Value Range   Troponin I <0.30  <0.30 ng/mL   Dg Chest Port 1 View  03/22/2012  *RADIOLOGY REPORT*  Clinical Data: Shortness of breath  PORTABLE CHEST - 1 VIEW   Comparison: 12/10/2010  Findings: Lungs are essentially clear.  No focal consolidation. No pleural effusion or pneumothorax.  Mild cardiomegaly.  Postsurgical changes related to prior CABG.  IMPRESSION: No evidence of acute cardiopulmonary disease.   Original Report Authenticated By: Charline Bills, M.D.      Date: 03/22/2012  0028  Rate: 71  Rhythm: sinus tachycardia  QRS Axis: normal  Intervals: normal  ST/T Wave abnormalities: normal  Conduction Disutrbances:none  Narrative Interpretation: inferior infarct and anterolateral infarct cited on or before 06/17/02  Old EKG Reviewed: unchanged c/w 12/10/10   0125 Patient given ntg sl x 3 with some relief of chest discomfort.  0230 Ambulated patient in hallway. O2 sats remained 98%. No shortness of breath or chest pain. Continues to have some left sided discomfort. Patient refused analgesic here. He states he has pain medicine at home he will take when he is discharged.  MDM  Patient with h/o CAD, CABG here with c/o chest discomfort and facial swelling. Not able to appreciate facial swelling. Chest discomfort did not respond to sl ntg. EKG, chest xray and labs were unremarkable. He was able to ambulate without increase in discomfort and without shortness of breath.  Pt feels improved after observation and/or treatment in ED.Pt stable in ED with no significant deterioration in condition.The patient appears reasonably screened and/or stabilized for discharge and I doubt any other medical condition or other [facility] requiring further screening, evaluation, or treatment in the ED at this time prior to discharge.  MDM Reviewed: nursing note and vitals Interpretation: labs, ECG and x-ray  CRITICAL CARE Performed by: Annamarie Dawley. Total critical care time: 30 Critical care time was exclusive of separately billable procedures and treating other patients. Critical care was necessary to treat or prevent imminent or life-threatening  deterioration. Critical care was time spent personally by me on the following activities: development of treatment plan with patient and/or surrogate as well as nursing, discussions with consultants, evaluation of patient's response to treatment, examination of patient, obtaining history from patient or surrogate, ordering and performing treatments and interventions, ordering and review of laboratory studies, ordering and review of radiographic studies, pulse oximetry and re-evaluation of patient's condition.      Nicoletta Dress. Colon Branch, MD 03/22/12 [phone]

## 2012-03-22 NOTE — ED Notes (Signed)
Chest pain that started earlier today. It went away, then tonight it came back and was worse per pt.

## 2012-04-01 ENCOUNTER — Ambulatory Visit (INDEPENDENT_AMBULATORY_CARE_PROVIDER_SITE_OTHER): Payer: Medicare Other | Admitting: Adult Health

## 2012-04-01 ENCOUNTER — Encounter: Payer: Self-pay | Admitting: Adult Health

## 2012-04-01 ENCOUNTER — Other Ambulatory Visit: Payer: Self-pay | Admitting: Cardiology

## 2012-04-01 VITALS — BP 120/74 | HR 76 | Ht 66.0 in | Wt 221.0 lb

## 2012-04-01 DIAGNOSIS — I2510 Atherosclerotic heart disease of native coronary artery without angina pectoris: Secondary | ICD-10-CM

## 2012-04-01 DIAGNOSIS — I10 Essential (primary) hypertension: Secondary | ICD-10-CM

## 2012-04-01 DIAGNOSIS — I7090 Unspecified atherosclerosis: Secondary | ICD-10-CM

## 2012-04-01 DIAGNOSIS — I2699 Other pulmonary embolism without acute cor pulmonale: Secondary | ICD-10-CM

## 2012-04-01 NOTE — Assessment & Plan Note (Signed)
Patient was taken off of coumadin by Dr.Rothbart in October of 2013 after being anticoagulated for one year with post CABG PE. Chest discomfort which lead him to ER is not likely related to PE.

## 2012-04-01 NOTE — Patient Instructions (Addendum)
Your physician recommends that you schedule a follow-up appointment in: WITH RR

## 2012-04-01 NOTE — Assessment & Plan Note (Signed)
Excellent control of BP on this visit. No changes in medications

## 2012-04-01 NOTE — Progress Notes (Signed)
HPI: [name] is a 66 y/o patient of Dr.Rothbart we are seeing for ongoing assessment and treatment of CAD with CABG in 2011, with PCI of RCA and LAD in 2004, hypertension,hyperlipidemia,remote pulmonary embolism no longer on coumadin, remote history of cocaine. He comes today after ER visit for chest pain which occurred after eating a rich chocolate brownie. Discomfort started that evening when he was reclining. With his history he become concerned and was evaluated at [facility]. Cardiac enzymes, and EKG were negative for ischemia. Pain was not reproducible. He was given NTG for relief and has had no further discomfort. Shortly after returning home, he had episodes of diarrhea. Since then, he has been asymptomatic.  Allergies  Allergen Reactions  . Lisinopril     Mouth and tongue swells    Current Outpatient Prescriptions  Medication Sig Dispense Refill  . aspirin 81 MG tablet Take 81 mg by mouth daily.       . isosorbide-hydrALAZINE (BIDIL) 20-37.5 MG per tablet Take 1 tablet by mouth 3 (three) times daily.  90 tablet  6  . metoprolol succinate (TOPROL-XL) 50 MG 24 hr tablet TAKE 1.5 TABLETS (75 MG TOTAL) BY MOUTH DAILY.  45 tablet  5  . nitroGLYCERIN (NITROSTAT) 0.4 MG SL tablet Place 0.4 mg under the tongue. Take 1 tablet under tongue at onset of chest pain; you may repeat every 5 minutes for up to 3 doses.      . simvastatin (ZOCOR) 40 MG tablet TAKE 1 TABLET BY MOUTH EVERY EVENING  30 tablet  6  . traMADol (ULTRAM) 50 MG tablet Take 50 mg by mouth every 6 (six) hours as needed. For pain. Patient takes every morning but does not take every evening        Past Medical History  Diagnosis Date  . Arteriosclerotic cardiovascular disease (ASCVD) 1998    Inferior MI->PCI of the RCA in 1998; acute anterior MI in 06/2002->  PCI of RCA and LAD with DES x2 to RCA, residual 80% ostial D1, and 80% mid CX and EF-40%; CABG-2011, LIMA-LAD, SVG to D1, OM1 & OM2; EF of 35-40% in 07/2010  . Substance abuse       formerly cocaine  . Hyperlipidemia   . Hypertension   . Pulmonary embolism 03/2010  . Chronic anticoagulation   . Keloid     median sternotomy    Past Surgical History  Procedure Date  . Ptca 06/1996    LAD & RCA  . Coronary artery bypass graft 03/18/2010    LIMA-LAD, SVG to diagonal, OM1 & OM2    ZOX:WRUEAV of systems complete and found to be negative unless listed above  PHYSICAL EXAM BP 120/74  Pulse 76  Ht 5\' 6"  (1.676 m)  Wt 221 lb (100.245 kg)  BMI 35.67 kg/m2  General: Well developed, well nourished, in no acute distress Head: Eyes PERRLA, No xanthomas.   Normal cephalic and atramatic  Lungs: Clear bilaterally to auscultation and percussion. Heart: HRRR S1 S2, without MRG.  Pulses are 2+ & equal.            No carotid bruit. No JVD.  No abdominal bruits. No femoral bruits. Abdomen: Bowel sounds are positive, abdomen soft and non-tender without masses or                  Hernia's noted. Msk:  Back normal, normal gait. Normal strength and tone for age. Extremities: No clubbing, cyanosis or edema.  DP +1 Neuro: Alert and oriented  X 3. Psych:  Good affect, responds appropriately  EKG: NSR with intraventricular conduction delay with old anterolateral and inferior infarct :Rate of 63 bpm.  ASSESSMENT AND PLAN

## 2012-04-01 NOTE — Progress Notes (Deleted)
Name: [name]    DOB: Aug 23, 1945  Age: 66 y.o.  MR#: [id number]       PCP:  Avon Gully, MD      Insurance: @PAYORNAME @   CC:   No chief complaint on file.   VS BP 120/74  Pulse 76  Ht 5\' 6"  (1.676 m)  Wt 221 lb (100.245 kg)  BMI 35.67 kg/m2  Weights Current Weight  04/01/12 221 lb (100.245 kg)  03/22/12 222 lb (100.699 kg)  01/14/12 227 lb (102.967 kg)    Blood Pressure  BP Readings from Last 3 Encounters:  04/01/12 120/74  03/22/12 165/79  01/14/12 128/80     Admit date:  (Not on file) Last encounter with RMR:  Visit date not found   Allergy Allergies  Allergen Reactions  . Lisinopril     Mouth and tongue swells    Current Outpatient Prescriptions  Medication Sig Dispense Refill  . aspirin 81 MG tablet Take 81 mg by mouth daily.       . isosorbide-hydrALAZINE (BIDIL) 20-37.5 MG per tablet Take 1 tablet by mouth 3 (three) times daily.  90 tablet  6  . metoprolol succinate (TOPROL-XL) 50 MG 24 hr tablet TAKE 1.5 TABLETS (75 MG TOTAL) BY MOUTH DAILY.  45 tablet  5  . nitroGLYCERIN (NITROSTAT) 0.4 MG SL tablet Place 0.4 mg under the tongue. Take 1 tablet under tongue at onset of chest pain; you may repeat every 5 minutes for up to 3 doses.      . simvastatin (ZOCOR) 40 MG tablet TAKE 1 TABLET BY MOUTH EVERY EVENING  30 tablet  6  . traMADol (ULTRAM) 50 MG tablet Take 50 mg by mouth every 6 (six) hours as needed. For pain. Patient takes every morning but does not take every evening        Discontinued Meds:   There are no discontinued medications.  Patient Active Problem List  Diagnosis  . HYPERLIPIDEMIA  . HYPERTENSION  . Arteriosclerotic cardiovascular disease (ASCVD)  . Pulmonary embolism  . Chronic anticoagulation    LABS Admission on 03/22/2012, Discharged on 03/22/2012  Component Date Value  . WBC 03/22/2012 6.0   . RBC 03/22/2012 5.47   . Hemoglobin 03/22/2012 16.3   . HCT 03/22/2012 48.0   . MCV 03/22/2012 87.8   . [facility] 03/22/2012 29.8     . MCHC 03/22/2012 34.0   . RDW 03/22/2012 13.2   . Platelets 03/22/2012 154   . Neutrophils Relative 03/22/2012 31*  . Neutro Abs 03/22/2012 1.9   . Lymphocytes Relative 03/22/2012 54*  . Lymphs Abs 03/22/2012 3.2   . Monocytes Relative 03/22/2012 10   . Monocytes Absolute 03/22/2012 0.6   . Eosinophils Relative 03/22/2012 5   . Eosinophils Absolute 03/22/2012 0.3   . Basophils Relative 03/22/2012 1   . Basophils Absolute 03/22/2012 0.0   . Sodium 03/22/2012 138   . Potassium 03/22/2012 3.5   . Chloride 03/22/2012 103   . CO2 03/22/2012 25   . Glucose, Bld 03/22/2012 110*  . BUN 03/22/2012 13   . Creatinine, Ser 03/22/2012 1.29   . Calcium 03/22/2012 9.3   . GFR calc non Af Amer 03/22/2012 57*  . GFR calc Af Amer 03/22/2012 66*  . Troponin I 03/22/2012 <0.30   Orders Only on 02/13/2012  Component Date Value  . D-Dimer, Quant 02/13/2012 0.69*  . Brain Natriuretic Peptide 02/13/2012 39.4   Anti-coag visit on 01/05/2012  Component Date Value  . INR 01/05/2012 2.7  Results for this Opt Visit:     Results for orders placed during the hospital encounter of 03/22/12  CBC WITH DIFFERENTIAL      Component Value Range   WBC 6.0  4.0 - 10.5 K/uL   RBC 5.47  4.22 - 5.81 MIL/uL   Hemoglobin 16.3  13.0 - 17.0 g/dL   HCT 46.9  62.9 - 52.8 %   MCV 87.8  78.0 - 100.0 fL   MCH 29.8  26.0 - 34.0 pg   MCHC 34.0  30.0 - 36.0 g/dL   RDW 41.3  24.4 - 01.0 %   Platelets 154  150 - 400 K/uL   Neutrophils Relative 31 (*) 43 - 77 %   Neutro Abs 1.9  1.7 - 7.7 K/uL   Lymphocytes Relative 54 (*) 12 - 46 %   Lymphs Abs 3.2  0.7 - 4.0 K/uL   Monocytes Relative 10  3 - 12 %   Monocytes Absolute 0.6  0.1 - 1.0 K/uL   Eosinophils Relative 5  0 - 5 %   Eosinophils Absolute 0.3  0.0 - 0.7 K/uL   Basophils Relative 1  0 - 1 %   Basophils Absolute 0.0  0.0 - 0.1 K/uL  BASIC METABOLIC PANEL      Component Value Range   Sodium 138  135 - 145 mEq/L   Potassium 3.5  3.5 - 5.1 mEq/L    Chloride 103  96 - 112 mEq/L   CO2 25  19 - 32 mEq/L   Glucose, Bld 110 (*) 70 - 99 mg/dL   BUN 13  6 - 23 mg/dL   Creatinine, Ser 2.72  0.50 - 1.35 mg/dL   Calcium 9.3  8.4 - 53.6 mg/dL   GFR calc non Af Amer 57 (*) >90 mL/min   GFR calc Af Amer 66 (*) >90 mL/min  TROPONIN I      Component Value Range   Troponin I <0.30  <0.30 ng/mL    EKG Orders placed in visit on 04/01/12  . EKG 12-LEAD     Prior Assessment and Plan Problem List as of 04/01/2012          HYPERLIPIDEMIA   Last Assessment & Plan Note   01/14/2012 Office Visit Signed 01/16/2012 10:37 AM by Kathlen Brunswick, MD    Extremely low lipid values in 2012.  Repeat profile will be obtained at patient's next visit.    HYPERTENSION   Last Assessment & Plan Note   01/14/2012 Office Visit Signed 01/16/2012 10:37 AM by Kathlen Brunswick, MD    No elevated blood pressures recorded in the past few years.  Current therapy will be continued.    Arteriosclerotic cardiovascular disease (ASCVD)   Last Assessment & Plan Note   01/14/2012 Office Visit Signed 01/16/2012 10:36 AM by Kathlen Brunswick, MD    Patient is doing very well one year post-CABG surgery.  He cannot be treated with ACE inhibitor due to allergic reaction that included upper airway edema.  Nitrates and hydralazine will be added to his medical regimen.    Pulmonary embolism   Chronic anticoagulation   Last Assessment & Plan Note   01/14/2012 Office Visit Signed 01/16/2012 10:34 AM by Kathlen Brunswick, MD    Patient has been treated for one year following pulmonary embolism that appears to be related to prior major surgery.  Warfarin will be discontinued and a d-dimer level and clinical assessment performed in one month.  Imaging: Dg Chest Port 1 View  03/22/2012  *RADIOLOGY REPORT*  Clinical Data: Shortness of breath  PORTABLE CHEST - 1 VIEW  Comparison: 12/10/2010  Findings: Lungs are essentially clear.  No focal consolidation. No pleural effusion or  pneumothorax.  Mild cardiomegaly.  Postsurgical changes related to prior CABG.  IMPRESSION: No evidence of acute cardiopulmonary disease.   Original Report Authenticated By: Charline Bills, M.D.      [facility] Calculation: Score not calculated. Missing: Total Cholesterol

## 2012-04-01 NOTE — Assessment & Plan Note (Signed)
Chest pain is atypical and associated with eating rich foods. May be GERD symptoms vs gallbladder disease. He is not positive for Murphy';s sign on exam. He had diarrhea after coming home from ER and has not had recurrence of symptoms.  No cardiac testing will be planned at this time unless symptoms persist.

## 2012-04-01 NOTE — Telephone Encounter (Signed)
Fax Received. Refill Completed. [name] (R.M.A)   

## 2012-04-25 IMAGING — CR DG CHEST 2V
2 series · 2 of 2 positions shown · non-contrast
Comparison: 07/26/2010

CLINICAL DATA: Trauma, chest pain

CHEST - 2 VIEW

[view not recorded (1 of 2)]
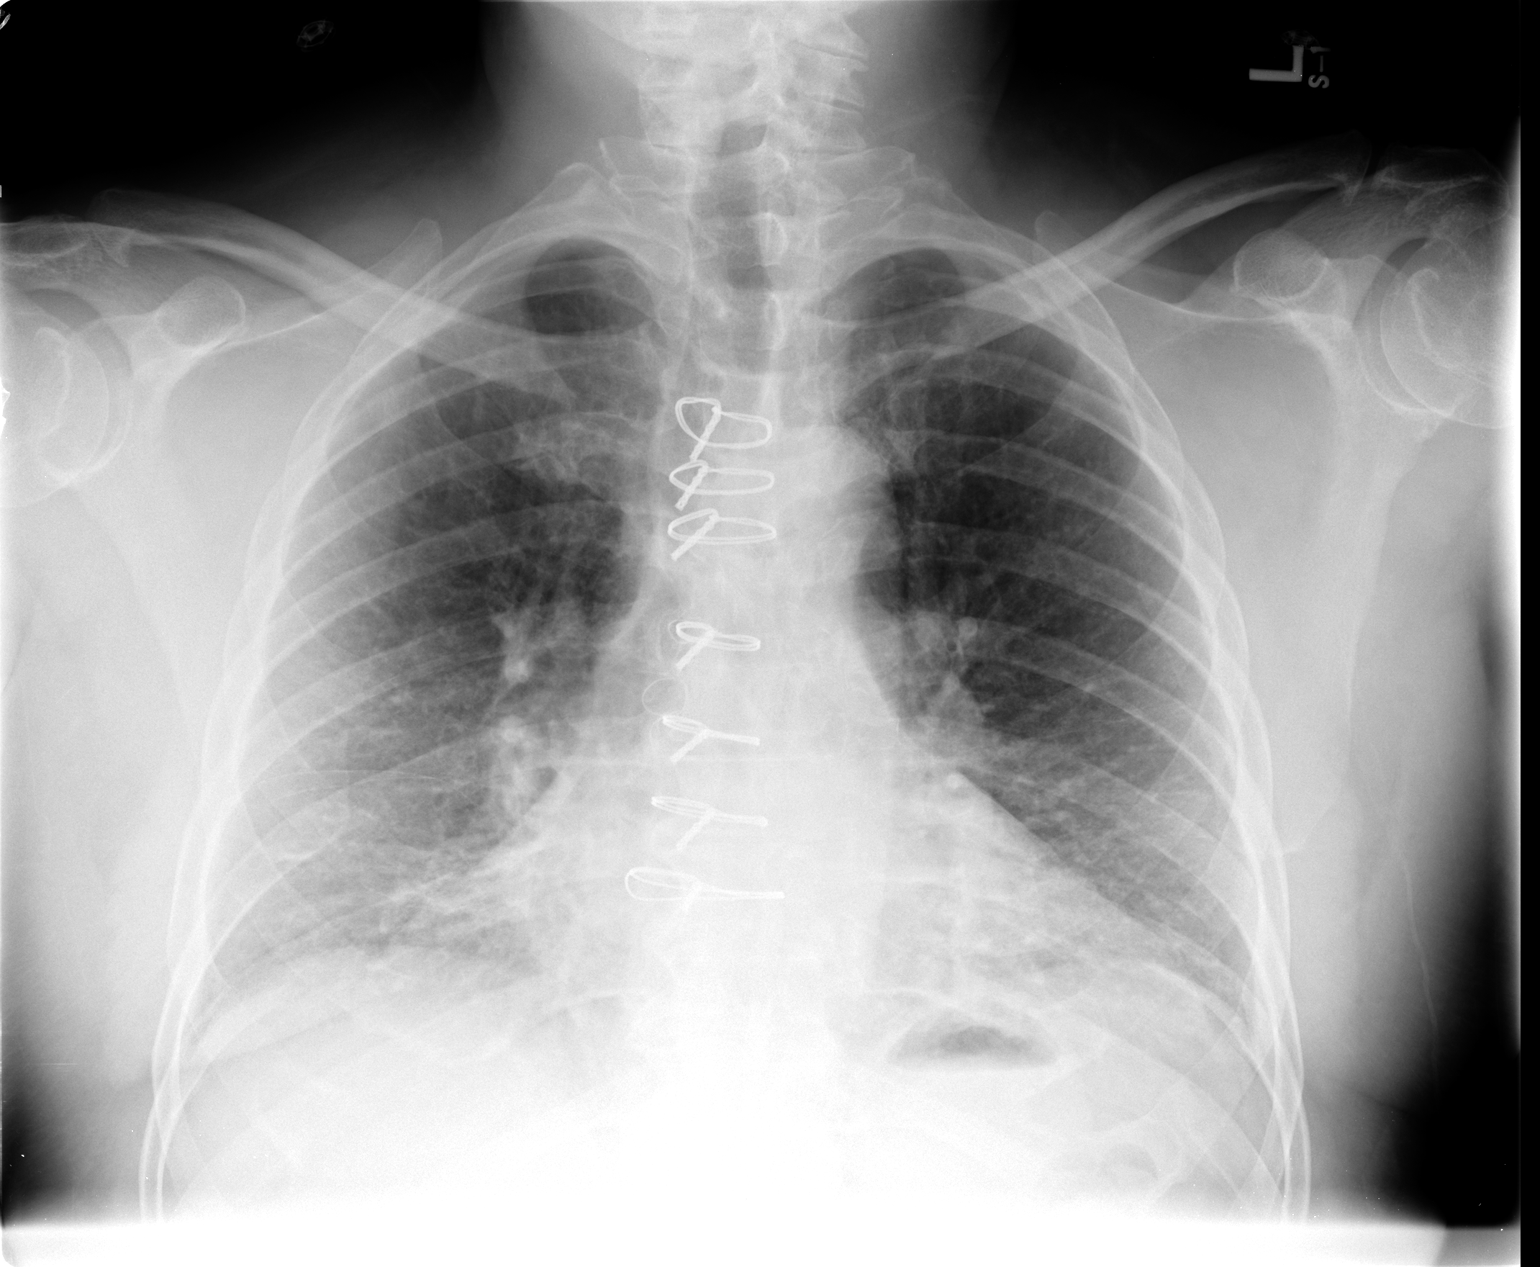

[view not recorded (2 of 2)]
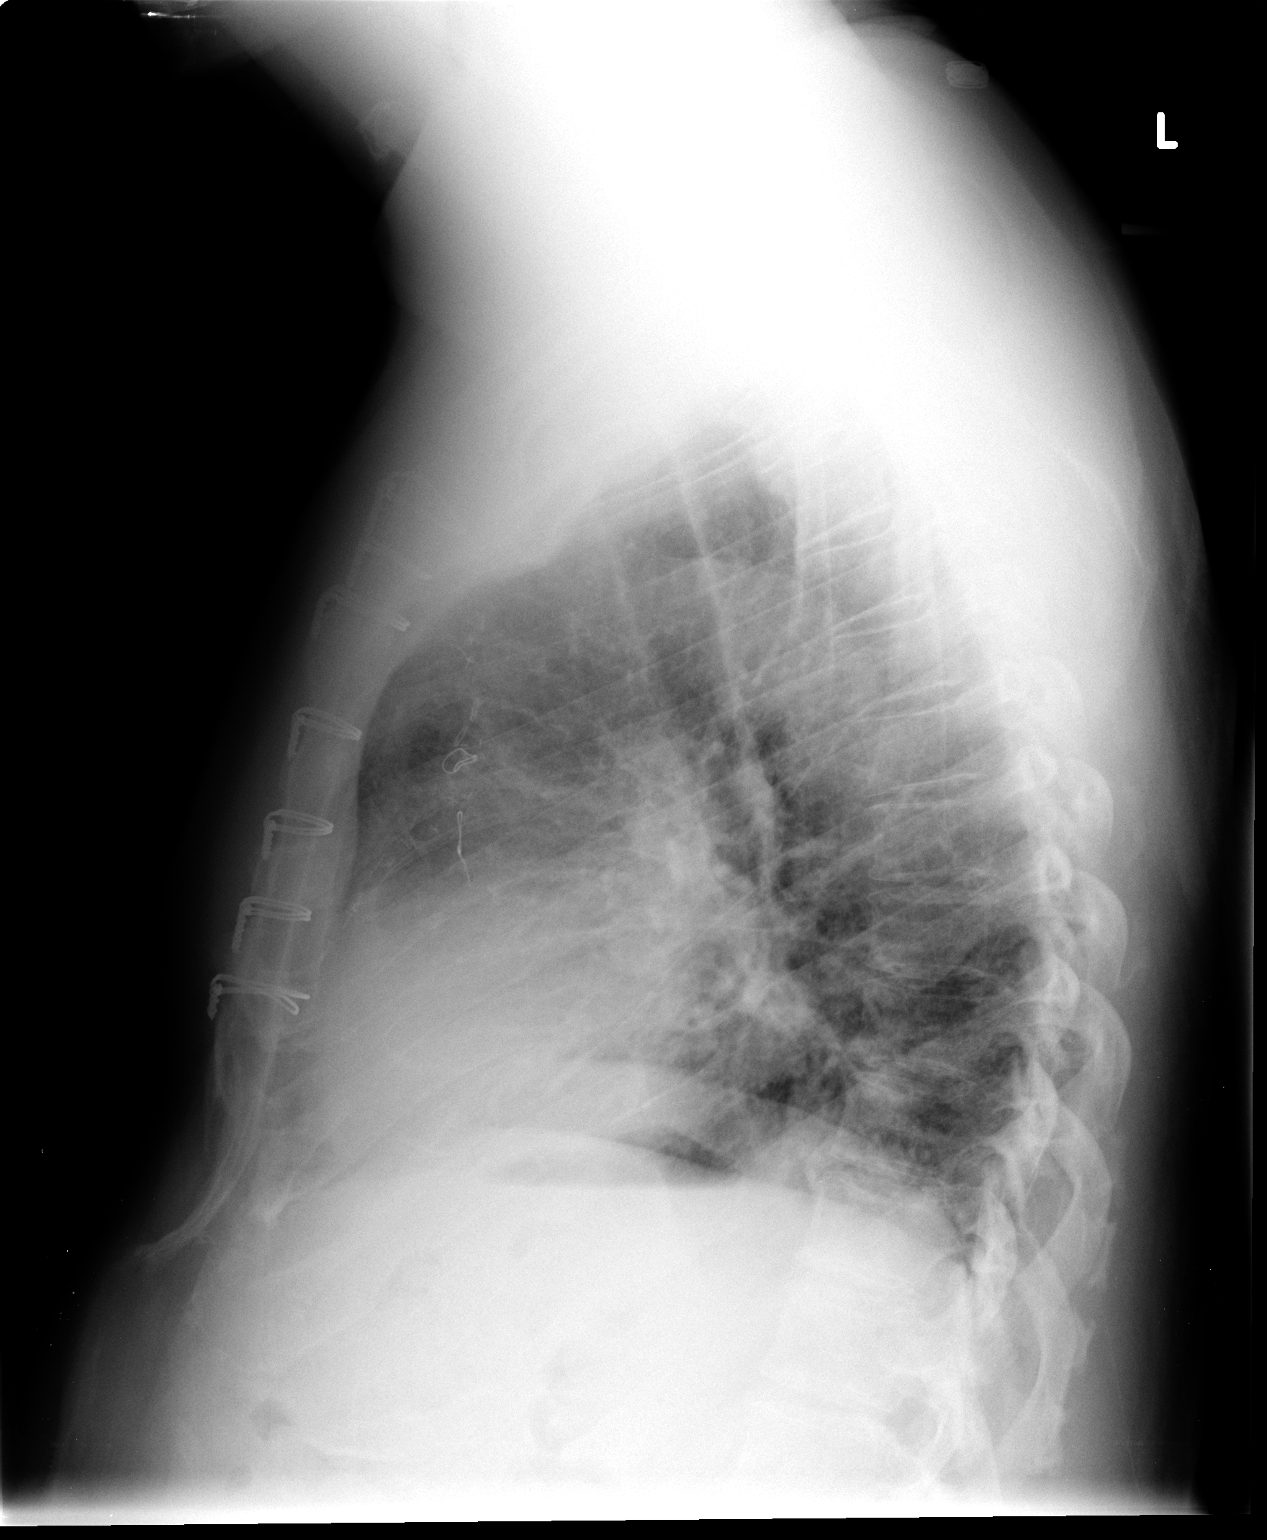

[2 of 2 positions shown; findings below may reference images not displayed]

FINDINGS: Mild bibasilar atelectasis.  Lungs otherwise clear. No
pleural effusion or pneumothorax.

The heart is top normal in size. Postsurgical changes related to
prior CABG.

Mild degenerative changes of the visualized thoracolumbar spine.
IMPRESSION: No evidence of acute cardiopulmonary disease.

Mild bibasilar atelectasis.

## 2012-06-06 ENCOUNTER — Inpatient Hospital Stay (HOSPITAL_COMMUNITY)
Admission: EM | Admit: 2012-06-06 | Discharge: 2012-06-11 | DRG: 065 | Disposition: A | Discharge status: Home / Self Care | Payer: PRIVATE HEALTH INSURANCE | Attending: Internal Medicine | Admitting: Internal Medicine

## 2012-06-06 ENCOUNTER — Emergency Department (HOSPITAL_COMMUNITY): Payer: PRIVATE HEALTH INSURANCE

## 2012-06-06 ENCOUNTER — Encounter (HOSPITAL_COMMUNITY): Payer: Self-pay

## 2012-06-06 DIAGNOSIS — Z951 Presence of aortocoronary bypass graft: Secondary | ICD-10-CM

## 2012-06-06 DIAGNOSIS — E785 Hyperlipidemia, unspecified: Secondary | ICD-10-CM | POA: Diagnosis present

## 2012-06-06 DIAGNOSIS — R209 Unspecified disturbances of skin sensation: Secondary | ICD-10-CM | POA: Diagnosis present

## 2012-06-06 DIAGNOSIS — I2699 Other pulmonary embolism without acute cor pulmonale: Secondary | ICD-10-CM | POA: Diagnosis present

## 2012-06-06 DIAGNOSIS — I639 Cerebral infarction, unspecified: Secondary | ICD-10-CM

## 2012-06-06 DIAGNOSIS — R5381 Other malaise: Secondary | ICD-10-CM | POA: Diagnosis present

## 2012-06-06 DIAGNOSIS — Z79899 Other long term (current) drug therapy: Secondary | ICD-10-CM

## 2012-06-06 DIAGNOSIS — I2782 Chronic pulmonary embolism: Secondary | ICD-10-CM | POA: Diagnosis present

## 2012-06-06 DIAGNOSIS — Z87891 Personal history of nicotine dependence: Secondary | ICD-10-CM

## 2012-06-06 DIAGNOSIS — I6350 Cerebral infarction due to unspecified occlusion or stenosis of unspecified cerebral artery: Principal | ICD-10-CM | POA: Diagnosis present

## 2012-06-06 DIAGNOSIS — R5383 Other fatigue: Secondary | ICD-10-CM | POA: Diagnosis present

## 2012-06-06 DIAGNOSIS — Z9861 Coronary angioplasty status: Secondary | ICD-10-CM

## 2012-06-06 DIAGNOSIS — I10 Essential (primary) hypertension: Secondary | ICD-10-CM | POA: Diagnosis present

## 2012-06-06 DIAGNOSIS — H538 Other visual disturbances: Secondary | ICD-10-CM | POA: Diagnosis present

## 2012-06-06 DIAGNOSIS — Z7901 Long term (current) use of anticoagulants: Secondary | ICD-10-CM

## 2012-06-06 DIAGNOSIS — I252 Old myocardial infarction: Secondary | ICD-10-CM

## 2012-06-06 DIAGNOSIS — E782 Mixed hyperlipidemia: Secondary | ICD-10-CM | POA: Diagnosis present

## 2012-06-06 DIAGNOSIS — I2510 Atherosclerotic heart disease of native coronary artery without angina pectoris: Secondary | ICD-10-CM | POA: Diagnosis present

## 2012-06-06 DIAGNOSIS — G459 Transient cerebral ischemic attack, unspecified: Secondary | ICD-10-CM

## 2012-06-06 DIAGNOSIS — I429 Cardiomyopathy, unspecified: Secondary | ICD-10-CM | POA: Diagnosis present

## 2012-06-06 LAB — COMPREHENSIVE METABOLIC PANEL
ALT: 24 U/L (ref 0–53)
AST: 21 U/L (ref 0–37)
Albumin: 4 g/dL (ref 3.5–5.2)
Alkaline Phosphatase: 53 U/L (ref 39–117)
BUN: 10 mg/dL (ref 6–23)
CO2: 26 mEq/L (ref 19–32)
Calcium: 9.3 mg/dL (ref 8.4–10.5)
Chloride: 100 mEq/L (ref 96–112)
Creatinine, Ser: 1.24 mg/dL (ref 0.50–1.35)
GFR calc Af Amer: 69 mL/min — ABNORMAL LOW (ref >90)
GFR calc non Af Amer: 59 mL/min — ABNORMAL LOW (ref >90)
Glucose, Bld: 98 mg/dL (ref 70–99)
Potassium: 4.1 mEq/L (ref 3.5–5.1)
Sodium: 133 mEq/L — ABNORMAL LOW (ref 135–145)
Total Bilirubin: 1.3 mg/dL — ABNORMAL HIGH (ref 0.3–1.2)
Total Protein: 7.5 g/dL (ref 6.0–8.3)

## 2012-06-06 LAB — CBC WITH DIFFERENTIAL/PLATELET
Basophils Absolute: 0 10*3/uL (ref 0.0–0.1)
Basophils Relative: 1 % (ref 0–1)
Eosinophils Absolute: 0.1 10*3/uL (ref 0.0–0.7)
Eosinophils Relative: 2 % (ref 0–5)
HCT: 47.8 % (ref 39.0–52.0)
Hemoglobin: 16 g/dL (ref 13.0–17.0)
Lymphocytes Relative: 48 % — ABNORMAL HIGH (ref 12–46)
Lymphs Abs: 2.4 10*3/uL (ref 0.7–4.0)
MCH: 29.5 pg (ref 26.0–34.0)
MCHC: 33.5 g/dL (ref 30.0–36.0)
MCV: 88 fL (ref 78.0–100.0)
Monocytes Absolute: 0.5 10*3/uL (ref 0.1–1.0)
Monocytes Relative: 10 % (ref 3–12)
Neutro Abs: 2 10*3/uL (ref 1.7–7.7)
Neutrophils Relative %: 39 % — ABNORMAL LOW (ref 43–77)
Platelets: 150 10*3/uL (ref 150–400)
RBC: 5.43 MIL/uL (ref 4.22–5.81)
RDW: 13.9 % (ref 11.5–15.5)
WBC: 5 10*3/uL (ref 4.0–10.5)

## 2012-06-06 LAB — TROPONIN I: Troponin I: <0.3 ng/mL (ref <0.30)

## 2012-06-06 MED ORDER — NITROGLYCERIN 0.4 MG SL SUBL: 0.4000 mg | SUBLINGUAL_TABLET | SUBLINGUAL | Status: DC | PRN

## 2012-06-06 MED ORDER — ISOSORB DINITRATE-HYDRALAZINE 20-37.5 MG PO TABS
ORAL_TABLET | ORAL | Status: AC
  Filled 2012-06-06: qty 1

## 2012-06-06 MED ORDER — SODIUM CHLORIDE 0.9 % IV SOLN
INTRAVENOUS | Status: AC
  Administered 2012-06-06: 18:00:00 via INTRAVENOUS

## 2012-06-06 MED ORDER — SIMVASTATIN 20 MG PO TABS
40.0000 mg | ORAL_TABLET | Freq: Every day | ORAL | Status: DC
  Administered 2012-06-07 – 2012-06-10 (×4): 40 mg via ORAL
  Filled 2012-06-06 (×5): qty 2

## 2012-06-06 MED ORDER — TRAMADOL HCL 50 MG PO TABS
50.0000 mg | ORAL_TABLET | Freq: Four times a day (QID) | ORAL | Status: DC | PRN
  Administered 2012-06-10: 50 mg via ORAL
  Filled 2012-06-06: qty 1

## 2012-06-06 MED ORDER — ENOXAPARIN SODIUM 40 MG/0.4ML ~~LOC~~ SOLN
40.0000 mg | SUBCUTANEOUS | Status: DC
  Administered 2012-06-06 – 2012-06-10 (×5): 40 mg via SUBCUTANEOUS
  Filled 2012-06-06 (×6): qty 0.4
  Filled 2012-06-06 (×2): qty 0.3

## 2012-06-06 MED ORDER — ASPIRIN 325 MG PO TABS
325.0000 mg | ORAL_TABLET | Freq: Every day | ORAL | Status: DC
  Administered 2012-06-06 – 2012-06-11 (×6): 325 mg via ORAL
  Filled 2012-06-06 (×6): qty 1

## 2012-06-06 MED ORDER — ISOSORB DINITRATE-HYDRALAZINE 20-37.5 MG PO TABS
1.0000 | ORAL_TABLET | Freq: Three times a day (TID) | ORAL | Status: DC
  Administered 2012-06-06 – 2012-06-11 (×14): 1 via ORAL
  Filled 2012-06-06 (×24): qty 1

## 2012-06-06 MED ORDER — ASPIRIN 81 MG PO CHEW
324.0000 mg | CHEWABLE_TABLET | Freq: Once | ORAL | Status: AC
  Administered 2012-06-06: 324 mg via ORAL
  Filled 2012-06-06: qty 4

## 2012-06-06 NOTE — ED Provider Notes (Signed)
History    This chart was scribed for Brian Lennert, MD by [name], ED Scribe. The patient was seen in room APA09/APA09. Patient's care was started at 1136.   CSN: [id number]  Arrival date & time 06/06/12  1120   First MD Initiated Contact with Patient 06/06/12 1136      Chief Complaint  Patient presents with  . Weakness   [name] is a 67 y.o. male who presents to the Emergency Department complaining of gradual onset, mild weakness that started this morning. He states that he lost his peripheral vision in his left eye last night around 11:30 pm and went to bed. He describes the disturbance as blurred vision. He states that upon waking this morning around 6 am, he was still having the visual disturbance. He then gradually started having weakness in his left chest/abdomen around 9 am. He denies any chest or abdominal pain. He denies any left sided weakness in his arm or leg. He states that he cataracts in his left eye and started wearing new glasses 2 days ago.   Patient is a 67 y.o. male presenting with weakness. The history is provided by the patient. No language interpreter was used.  Weakness This is a new problem. The current episode started 3 to 5 hours ago. The problem occurs constantly. The problem has been gradually worsening. Pertinent negatives include no chest pain, no abdominal pain and no headaches. Nothing aggravates the symptoms. Nothing relieves the symptoms. He has tried nothing for the symptoms.    Past Medical History  Diagnosis Date  . Arteriosclerotic cardiovascular disease (ASCVD) 1998    Inferior MI->PCI of the RCA in 1998; acute anterior MI in 06/2002->  PCI of RCA and LAD with DES x2 to RCA, residual 80% ostial D1, and 80% mid CX and EF-40%; CABG-2011, LIMA-LAD, SVG to D1, OM1 & OM2; EF of 35-40% in 07/2010  . Substance abuse     formerly cocaine  . Hyperlipidemia   . Pulmonary embolism 03/2010  . Chronic anticoagulation   . Keloid    median sternotomy    Past Surgical History  Procedure Laterality Date  . Ptca  06/1996    LAD & RCA  . Coronary artery bypass graft  03/18/2010    LIMA-LAD, SVG to diagonal, OM1 & OM2    Family History  Problem Relation Age of Onset  . Cancer Mother 65    History  Substance Use Topics  . Smoking status: Former Games developer  . Smokeless tobacco: Not on file  . Alcohol Use: No      Review of Systems  Constitutional: Negative for fatigue.  HENT: Negative for congestion, sinus pressure and ear discharge.   Eyes: Positive for visual disturbance. Negative for discharge.  Respiratory: Negative for cough.   Cardiovascular: Negative for chest pain.  Gastrointestinal: Negative for abdominal pain and diarrhea.  Genitourinary: Negative for frequency and hematuria.  Musculoskeletal: Negative for back pain.  Skin: Negative for rash.  Neurological: Positive for weakness. Negative for seizures and headaches.  Psychiatric/Behavioral: Negative for hallucinations.  All other systems reviewed and are negative.    Allergies  Lisinopril  Home Medications   Current Outpatient Rx  Name  Route  Sig  Dispense  Refill  . aspirin 81 MG tablet   Oral   Take 81 mg by mouth daily.          . isosorbide-hydrALAZINE (BIDIL) 20-37.5 MG per tablet   Oral   Take 1 tablet  by mouth 3 (three) times daily.   90 tablet   6   . metoprolol succinate (TOPROL-XL) 50 MG 24 hr tablet      TAKE 1.5 TABLETS (75 MG TOTAL) BY MOUTH DAILY.   45 tablet   5   . nitroGLYCERIN (NITROSTAT) 0.4 MG SL tablet   Sublingual   Place 0.4 mg under the tongue. Take 1 tablet under tongue at onset of chest pain; you may repeat every 5 minutes for up to 3 doses.         . simvastatin (ZOCOR) 40 MG tablet      TAKE 1 TABLET BY MOUTH EVERY EVENING   30 tablet   6   . traMADol (ULTRAM) 50 MG tablet   Oral   Take 50 mg by mouth every 6 (six) hours as needed. For pain. Patient takes every morning but does not take  every evening           BP 145/71  Pulse 66  Temp(Src) 97.9 F (36.6 C) (Oral)  Resp 24  SpO2 100%  Physical Exam  Nursing note and vitals reviewed. Constitutional: He is oriented to person, place, and time. He appears well-developed.  HENT:  Head: Normocephalic and atraumatic.  Eyes: Conjunctivae and EOM are normal. No scleral icterus.  Peripheal vision is decreased in LUQ and LLQ with blurriness.  Neck: Neck supple. No thyromegaly present.  Cardiovascular: Normal rate, regular rhythm and normal heart sounds.  Exam reveals no gallop and no friction rub.   No murmur heard. Pulmonary/Chest: Effort normal and breath sounds normal. No stridor. No respiratory distress. He has no wheezes. He has no rales. He exhibits no tenderness.  Abdominal: Soft. Bowel sounds are normal. He exhibits no distension. There is no tenderness. There is no rebound.  Musculoskeletal: Normal range of motion. He exhibits no edema.  Lymphadenopathy:    He has no cervical adenopathy.  Neurological: He is alert and oriented to person, place, and time. Coordination normal.  Skin: No rash noted. No erythema.  Psychiatric: He has a normal mood and affect. His behavior is normal.    ED Course  Procedures (including critical care time)  DIAGNOSTIC STUDIES: Oxygen Saturation is 100% on room air, normal by my interpretation.    COORDINATION OF CARE:  11:45-Discussed planned course of treatment with the patient including a head CT and blood work, who is agreeable at this time.    Results for orders placed during the hospital encounter of 06/06/12  COMPREHENSIVE METABOLIC PANEL      Result Value Range   Sodium 133 (*) 135 - 145 mEq/L   Potassium 4.1  3.5 - 5.1 mEq/L   Chloride 100  96 - 112 mEq/L   CO2 26  19 - 32 mEq/L   Glucose, Bld 98  70 - 99 mg/dL   BUN 10  6 - 23 mg/dL   Creatinine, Ser 4.54  0.50 - 1.35 mg/dL   Calcium 9.3  8.4 - 09.8 mg/dL   Total Protein 7.5  6.0 - 8.3 g/dL   Albumin 4.0  3.5  - 5.2 g/dL   AST 21  0 - 37 U/L   ALT 24  0 - 53 U/L   Alkaline Phosphatase 53  39 - 117 U/L   Total Bilirubin 1.3 (*) 0.3 - 1.2 mg/dL   GFR calc non Af Amer 59 (*) >90 mL/min   GFR calc Af Amer 69 (*) >90 mL/min  CBC WITH DIFFERENTIAL  Result Value Range   WBC 5.0  4.0 - 10.5 K/uL   RBC 5.43  4.22 - 5.81 MIL/uL   Hemoglobin 16.0  13.0 - 17.0 g/dL   HCT 62.1  30.8 - 65.7 %   MCV 88.0  78.0 - 100.0 fL   MCH 29.5  26.0 - 34.0 pg   MCHC 33.5  30.0 - 36.0 g/dL   RDW 84.6  96.2 - 95.2 %   Platelets 150  150 - 400 K/uL   Neutrophils Relative 39 (*) 43 - 77 %   Neutro Abs 2.0  1.7 - 7.7 K/uL   Lymphocytes Relative 48 (*) 12 - 46 %   Lymphs Abs 2.4  0.7 - 4.0 K/uL   Monocytes Relative 10  3 - 12 %   Monocytes Absolute 0.5  0.1 - 1.0 K/uL   Eosinophils Relative 2  0 - 5 %   Eosinophils Absolute 0.1  0.0 - 0.7 K/uL   Basophils Relative 1  0 - 1 %   Basophils Absolute 0.0  0.0 - 0.1 K/uL  TROPONIN I      Result Value Range   Troponin I <0.30  <0.30 ng/mL    Ct Head Wo Contrast  06/06/2012  *RADIOLOGY REPORT*  Clinical Data: Left-sided weakness  CT HEAD WITHOUT CONTRAST  Technique:  Contiguous axial images were obtained from the base of the skull through the vertex without contrast.  Comparison: None.  Findings: No evidence of parenchymal hemorrhage or extra-axial fluid collection. No mass lesion, mass effect, or midline shift.  No CT evidence of acute infarction.  Subcortical white matter and periventricular small vessel ischemic changes, most prominent in the subcortical left frontal lobe (series 2/image 15).  Intracranial atherosclerosis.  The visualized paranasal sinuses are essentially clear. The mastoid air cells are unopacified.  No evidence of calvarial fracture.  IMPRESSION: No evidence of acute intracranial abnormality.  Small vessel ischemic changes, as described above.   Original Report Authenticated By: Charline Bills, M.D.      No diagnosis found.    Date: 06/06/2012   Rate: 61  Rhythm: normal sinus rhythm  QRS Axis: normal  Intervals: normal  ST/T Wave abnormalities: nonspecific ST changes  Conduction Disutrbances:none  Narrative Interpretation:   Old EKG Reviewed: unchanged   MDM  Tele neurology to see pt   The chart was scribed for me under my direct supervision.  I personally performed the history, physical, and medical decision making and all procedures in the evaluation of this patient.Brian Lennert, MD 06/06/12 [phone]

## 2012-06-06 NOTE — ED Provider Notes (Signed)
Shelda Jakes, MD  Telemedicine neurology consult completed. They recommended admission equivocal whether this is true CVA or true left-sided visual defect. But they feel the patient needs an MRI specifically stated that patient could wait tomorrow to have the MRI done. Patient's followed by Dr. Felecia Shelling Dr. Megan Mans is covering and he will admit temporary admit orders done.  Shelda Jakes, MD 06/06/12 [phone]

## 2012-06-06 NOTE — ED Notes (Signed)
Patient states that last night before bed he lost peripheral vision in his left eye. Patient still experiencing vision symptoms and began to feel weak this morning on the left side. He feels like he is getting weaker.

## 2012-06-07 ENCOUNTER — Observation Stay (HOSPITAL_COMMUNITY): Payer: PRIVATE HEALTH INSURANCE

## 2012-06-07 ENCOUNTER — Observation Stay (HOSPITAL_COMMUNITY)
Admit: 2012-06-07 | Discharge: 2012-06-07 | Disposition: A | Discharge status: Home / Self Care | Payer: PRIVATE HEALTH INSURANCE | Attending: Neurology | Admitting: Neurology

## 2012-06-07 LAB — TSH: TSH: 1.043 u[IU]/mL (ref 0.350–4.500)

## 2012-06-07 LAB — VITAMIN B12: Vitamin B-12: 333 pg/mL (ref 211–911)

## 2012-06-07 LAB — RPR: RPR Ser Ql: NONREACTIVE

## 2012-06-07 MED ORDER — GADOBENATE DIMEGLUMINE 529 MG/ML IV SOLN
20.0000 mL | Freq: Once | INTRAVENOUS | Status: AC | PRN
  Administered 2012-06-07: 20 mL via INTRAVENOUS

## 2012-06-07 NOTE — Progress Notes (Signed)
EEG completed.

## 2012-06-07 NOTE — Procedures (Signed)
EEG report.  Brief clinical history:  67 years old with left facial spasms. Concerns for focal seizures. No prior history of frank epileptic seizures.  Technique: this is a 17 channel routine scalp EEG performed at the bedside with bipolar and monopolar montages arranged in accordance to the international 10/20 system of electrode placement. One channel was dedicated to EKG recording.  The study was performed during wakefulness, drowsiness, and stage 2 sleep. No activating procedures performed during the test.  Description:In the wakeful state, the best background consisted of a medium amplitude, posterior dominant, well sustained, symmetric and reactive 10 Hz rhythm. Drowsiness demonstrated dropout of the alpha rhythm. Stage 2 sleep showed symmetric and synchronous sleep spindles without intermixed epileptiform discharges. Hyperventilation induced mild, diffuse, physiologic slowing but no epileptiform discharges. Intermittent photic stimulation did induce a normal driving response.  No focal or generalized epileptiform discharges noted.  No pathologic areas of slowing seen.  EKG showed sinus rhythm.  Impression: this is a normal awake and asleep EEG. Please, be aware that a normal EEG does not exclude the possibility of epilepsy.  Clinical correlation is advised.  Wyatt Portela, MD

## 2012-06-07 NOTE — Care Management Note (Unsigned)
    Page 1 of 1   06/10/2012     2:36:42 PM   CARE MANAGEMENT NOTE 06/10/2012  Patient:  [name], [name]   Account Number:  [id number]  Date Initiated:  06/07/2012  Documentation initiated by:  Sharrie Rothman  Subjective/Objective Assessment:   Pt admitted from home with possible CVA. Pt lives with friends and will return home with them at discharge. Pt is independent with ADL's.     Action/Plan:   No CM needs noted.   Anticipated DC Date:  06/08/2012   Anticipated DC Plan:  HOME/SELF CARE      DC Planning Services  CM consult      Choice offered to / List presented to:             Status of service:  Completed, signed off Medicare Important Message given?   (If response is "NO", the following Medicare IM given date fields will be blank) Date Medicare IM given:   Date Additional Medicare IM given:    Discharge Disposition:    Per UR Regulation:    If discussed at Long Length of Stay Meetings, dates discussed:    Comments:  06/10/12 1435 Arlyss Queen, RN BSN CM Pt having TEE today. Possible discharge later today.  06/07/12 1400 Arlyss Queen, RN BSN CM

## 2012-06-07 NOTE — Consult Note (Signed)
[facility] [name] A. Brian Pilgrim, MD     [email or web address]          [name] is an 67 y.o. male.   ASSESSMENT/PLAN: I suspect that the patient most likely has had a small stroke involving the right hemisphere. Alternatively, he did seem to have some unusual presentation with spasms of the left facial region which raises the possibility that he could also have had a seizure. Risk factors are coronary artery disease, age and hypertension. I recommend that the patient be worked up with an MRI of the brain and also an MRA. I believe he has a carotid Doppler pending. An EEG is also pending.  The patient is a 67 year old white male who presents with the acute onset of unusual symptoms characterized by dysesthesias a left facial region, visual obscuration and left-sided chest and abdominal discomfort. The symptoms seem to last for about 2 hours. The left-sided chest and abdominal symptoms resolved after a few minutes but he was left with visual obscuration and left facial dysesthesia along with a pulling like sensation. He also reports having left-sided headache lasting for a few minutes. The visual obscuration is characterized with no only blurring of the vision but flashing lights. The patient does not report having dysarthria, dysphasia, dyspnea, urinary symptoms or GU symptoms. He does not report having chest pain other than the left-sided sensory symptoms/dysesthesia. The patient does not report loss of consciousness, tonic-clonic activity or oral trauma. He believes that he is now at baseline and feels fine at this time.  GENERAL: This is a pleasant man who is in no acute distress.  HEENT: Neck is supple. Head is normocephalic and atraumatic.  ABDOMEN: soft  EXTREMITIES: No edema   BACK: Unremarkable.  SKIN: Normal by inspection.    MENTAL STATUS: Alert and oriented. Speech, language and cognition are generally intact. Judgment and insight normal. His speech is a little  heavy but he thinks that his speech is at baseline and he denies any dysarthria.  CRANIAL NERVES: Pupils are equal, round and reactive to light and accomodation; extra ocular movements are full, there is no significant nystagmus; visual fields are full; upper and lower facial muscles are normal in strength and symmetric, there is no flattening of the nasolabial folds; tongue is midline; uvula is midline; shoulder elevation is normal.  MOTOR: Normal tone, bulk and strength; no pronator drift.  COORDINATION: Left finger to nose is normal, right finger to nose is normal, No rest tremor; no intention tremor; no postural tremor; no bradykinesia.  REFLEXES: Deep tendon reflexes are symmetrical and normal. Plantar responses are flexor bilaterally.   SENSATION: Normal to light touch and temperature.    Past Medical History  Diagnosis Date  . Arteriosclerotic cardiovascular disease (ASCVD) 1998    Inferior MI->PCI of the RCA in 1998; acute anterior MI in 06/2002->  PCI of RCA and LAD with DES x2 to RCA, residual 80% ostial D1, and 80% mid CX and EF-40%; CABG-2011, LIMA-LAD, SVG to D1, OM1 & OM2; EF of 35-40% in 07/2010  . Substance abuse     formerly cocaine  . Hyperlipidemia   . Pulmonary embolism 03/2010  . Chronic anticoagulation   . Keloid     median sternotomy    Past Surgical History  Procedure Laterality Date  . Ptca  06/1996    LAD & RCA  . Coronary artery bypass graft  03/18/2010    LIMA-LAD, SVG to diagonal, OM1 & OM2    Family History  Problem Relation Age of Onset  . Cancer Mother 18    Social History:  reports that he has quit smoking. He does not have any smokeless tobacco history on file. He reports that he does not drink alcohol or use illicit drugs.  Allergies:  Allergies  Allergen Reactions  . Lisinopril     Mouth and tongue swells    Medications: Prior to Admission medications   Medication Sig Start Date End Date Taking? Authorizing Provider  aspirin 81 MG  tablet Take 81 mg by mouth daily.    Yes Historical Provider, MD  isosorbide-hydrALAZINE (BIDIL) 20-37.5 MG per tablet Take 1 tablet by mouth 3 (three) times daily. 01/14/12  Yes Kathlen Brunswick, MD  metoprolol succinate (TOPROL-XL) 50 MG 24 hr tablet TAKE 1.5 TABLETS (75 MG TOTAL) BY MOUTH DAILY. 04/01/12  Yes Kathlen Brunswick, MD  nitroGLYCERIN (NITROSTAT) 0.4 MG SL tablet Place 0.4 mg under the tongue. Take 1 tablet under tongue at onset of chest pain; you may repeat every 5 minutes for up to 3 doses.   Yes Historical Provider, MD  simvastatin (ZOCOR) 40 MG tablet TAKE 1 TABLET BY MOUTH EVERY EVENING 01/30/12  Yes Kathlen Brunswick, MD  traMADol (ULTRAM) 50 MG tablet Take 50 mg by mouth every 6 (six) hours as needed. For pain. Patient takes every morning but does not take every evening   Yes Historical Provider, MD    Scheduled Meds: . aspirin  325 mg Oral Daily  . enoxaparin  40 mg Subcutaneous Q24H  . isosorbide-hydrALAZINE  1 tablet Oral TID  . simvastatin  40 mg Oral q1800   Continuous Infusions:  PRN Meds:.nitroGLYCERIN, traMADol   Blood pressure 111/59, pulse 72, temperature 97.8 F (36.6 C), temperature source Oral, resp. rate 20, height 5\' 6"  (1.676 m), weight 97.7 kg (215 lb 6.2 oz), SpO2 96.00%.   Results for orders placed during the hospital encounter of 06/06/12 (from the past 48 hour(s))  COMPREHENSIVE METABOLIC PANEL     Status: Abnormal   Collection Time    06/06/12 11:50 AM      Result Value Range   Sodium 133 (*) 135 - 145 mEq/L   Potassium 4.1  3.5 - 5.1 mEq/L   Chloride 100  96 - 112 mEq/L   CO2 26  19 - 32 mEq/L   Glucose, Bld 98  70 - 99 mg/dL   BUN 10  6 - 23 mg/dL   Creatinine, Ser 1.61  0.50 - 1.35 mg/dL   Calcium 9.3  8.4 - 09.6 mg/dL   Total Protein 7.5  6.0 - 8.3 g/dL   Albumin 4.0  3.5 - 5.2 g/dL   AST 21  0 - 37 U/L   ALT 24  0 - 53 U/L   Alkaline Phosphatase 53  39 - 117 U/L   Total Bilirubin 1.3 (*) 0.3 - 1.2 mg/dL   GFR calc non Af Amer  59 (*) >90 mL/min   GFR calc Af Amer 69 (*) >90 mL/min   Comment:            The eGFR has been calculated     using the CKD EPI equation.     This calculation has not been     validated in all clinical     situations.     eGFR's persistently     <90 mL/min signify     possible Chronic Kidney Disease.  CBC WITH DIFFERENTIAL     Status: Abnormal  Collection Time    06/06/12 11:50 AM      Result Value Range   WBC 5.0  4.0 - 10.5 K/uL   RBC 5.43  4.22 - 5.81 MIL/uL   Hemoglobin 16.0  13.0 - 17.0 g/dL   HCT 40.9  81.1 - 91.4 %   MCV 88.0  78.0 - 100.0 fL   MCH 29.5  26.0 - 34.0 pg   MCHC 33.5  30.0 - 36.0 g/dL   RDW 78.2  95.6 - 21.3 %   Platelets 150  150 - 400 K/uL   Neutrophils Relative 39 (*) 43 - 77 %   Neutro Abs 2.0  1.7 - 7.7 K/uL   Lymphocytes Relative 48 (*) 12 - 46 %   Lymphs Abs 2.4  0.7 - 4.0 K/uL   Monocytes Relative 10  3 - 12 %   Monocytes Absolute 0.5  0.1 - 1.0 K/uL   Eosinophils Relative 2  0 - 5 %   Eosinophils Absolute 0.1  0.0 - 0.7 K/uL   Basophils Relative 1  0 - 1 %   Basophils Absolute 0.0  0.0 - 0.1 K/uL  TROPONIN I     Status: None   Collection Time    06/06/12 11:50 AM      Result Value Range   Troponin I <0.30  <0.30 ng/mL   Comment:            Due to the release kinetics of cTnI,     a negative result within the first hours     of the onset of symptoms does not rule out     myocardial infarction with certainty.     If myocardial infarction is still suspected,     repeat the test at appropriate intervals.    Ct Head Wo Contrast  06/06/2012  *RADIOLOGY REPORT*  Clinical Data: Left-sided weakness  CT HEAD WITHOUT CONTRAST  Technique:  Contiguous axial images were obtained from the base of the skull through the vertex without contrast.  Comparison: None.  Findings: No evidence of parenchymal hemorrhage or extra-axial fluid collection. No mass lesion, mass effect, or midline shift.  No CT evidence of acute infarction.  Subcortical white matter and  periventricular small vessel ischemic changes, most prominent in the subcortical left frontal lobe (series 2/image 15).  Intracranial atherosclerosis.  The visualized paranasal sinuses are essentially clear. The mastoid air cells are unopacified.  No evidence of calvarial fracture.  IMPRESSION: No evidence of acute intracranial abnormality.  Small vessel ischemic changes, as described above.   Original Report Authenticated By: Charline Bills, M.D.         Jermaine Tholl A. [name], M.D.  Diplomate, Biomedical engineer of Psychiatry and Neurology ( Neurology). 06/07/2012, 10:03 AM

## 2012-06-07 NOTE — Evaluation (Signed)
Physical Therapy Evaluation Patient Details Name: [name] MRN: [id number] DOB: Feb 23, 1946 Today's Date: 06/07/2012 Time: 2130-8657 PT Time Calculation (min): 20 min  PT Assessment / Plan / Recommendation Clinical Impression  [name] is admitted to [facility] for possible CVA and referred to PT for general mobility.  At this time pt scored a 49/56 on berg balance scale and is likely decreased slightly as pt reports greater ease when he is wearing shoes, he is mod I to I with all mobility and does not have a decrease in activity tolerance.  R and L UE proripception is normals.     PT Assessment  Patent does not need any further PT services    Follow Up Recommendations  No PT follow up all education complete, pt is at modified Independent to independent for all mobility.     Does the patient have the potential to tolerate intense rehabilitation      Barriers to Discharge        Equipment Recommendations       Recommendations for Other Services     Frequency      Precautions / Restrictions     Pertinent Vitals/Pain No pain      Mobility  Bed Mobility Bed Mobility: Supine to Sit Transfers Transfers: Sit to Stand;Stand to Sit Sit to Stand: 6: Modified independent (Device/Increase time);From bed;With upper extremity assist Stand to Sit: 7: Independent;Without upper extremity assist;To chair/3-in-1 Ambulation/Gait Ambulation/Gait Assistance: 7: Independent Ambulation Distance (Feet): 300 Feet Assistive device: None Gait Pattern: Within Functional Limits    Exercises     PT Diagnosis:    PT Problem List:   PT Treatment Interventions:     PT Goals    Visit Information  Last PT Received On: 06/07/12    Subjective Data  Subjective: I am feeling better today. Patient Stated Goal: I plan to go home.    Prior Functioning  Home Living Lives With: Significant other Type of Home: House Home Access: Stairs to enter [employer] of Steps: 3, 2 Entrance  Stairs-Rails: None Home Layout: Two level;Able to live on main level with bedroom/bathroom Bathroom Shower/Tub: Engineer, manufacturing systems: Standard Home Adaptive Equipment: None Prior Function Level of Independence: Independent Able to Take Stairs?: Reciprically Driving: Yes Vocation: On disability Communication Communication: No difficulties Dominant Hand: Right    Cognition  Cognition Overall Cognitive Status: Appears within functional limits for tasks assessed/performed Arousal/Alertness: Awake/alert Orientation Level: Oriented X4 / Intact    Extremity/Trunk Assessment Right Upper Extremity Assessment RUE ROM/Strength/Tone: Within functional levels RUE Sensation: WFL - Light Touch Left Upper Extremity Assessment LUE ROM/Strength/Tone: Within functional levels Right Lower Extremity Assessment RLE ROM/Strength/Tone: Within functional levels Left Lower Extremity Assessment LLE ROM/Strength/Tone: Within functional levels Trunk Assessment Trunk Assessment: Normal   Balance Standardized Balance Assessment Standardized Balance Assessment: Berg Balance Test Berg Balance Test Sit to Stand: Able to stand without using hands and stabilize independently Standing Unsupported: Able to stand safely 2 minutes Sitting with Back Unsupported but Feet Supported on Floor or Stool: Able to sit safely and securely 2 minutes Stand to Sit: Sits safely with minimal use of hands Transfers: Able to transfer safely, minor use of hands Standing Unsupported with Eyes Closed: Able to stand 10 seconds with supervision Standing Ubsupported with Feet Together: Able to place feet together independently and stand 1 minute safely From Standing, Reach Forward with Outstretched Arm: Can reach forward >12 cm safely (5") From Standing Position, Pick up Object from Floor: Able to pick  up shoe safely and easily From Standing Position, Turn to Look Behind Over each Shoulder: Turn sideways only but maintains  balance Turn 360 Degrees: Able to turn 360 degrees safely but slowly Standing Unsupported, Alternately Place Feet on Step/Stool: Able to stand independently and complete 8 steps >20 seconds Standing Unsupported, One Foot in Front: Able to place foot tandem independently and hold 30 seconds Standing on One Leg: Able to lift leg independently and hold > 10 seconds Total Score: 49  End of Session PT - End of Session Equipment Utilized During Treatment: Gait belt Patient left: in chair  GP Functional Limitation: Mobility: Walking and moving around Mobility: Walking and Moving Around Current Status [phone]): At least 1 percent but less than 20 percent impaired, limited or restricted Mobility: Walking and Moving Around Goal Status [phone]): At least 1 percent but less than 20 percent impaired, limited or restricted Mobility: Walking and Moving Around Discharge Status [phone]): At least 1 percent but less than 20 percent impaired, limited or restricted   Trena Dunavan, PT 06/07/2012, 9:21 AM

## 2012-06-07 NOTE — Progress Notes (Signed)
Subjective: Patient was admitted last night due to lt sided weakness as possible case of CVa. He is feeling slightly better today.  Objective: Vital signs in last 24 hours: Temp:  [97.3 F (36.3 C)-98 F (36.7 C)] 97.6 F (36.4 C) (03/03 1500) Pulse Rate:  [57-75] 75 (03/03 1500) Resp:  [20] 20 (03/03 1500) BP: (111-131)/(59-73) 119/65 mmHg (03/03 1500) SpO2:  [96 %-99 %] 97 % (03/03 1500) Weight:  [97.7 kg (215 lb 6.2 oz)] 97.7 kg (215 lb 6.2 oz) (03/02 2003) Weight change:  Last BM Date: 06/05/12  Intake/Output from previous day: 03/02 0701 - 03/03 0700 In: 240 [P.O.:240] Out: -   PHYSICAL EXAM General appearance: alert and no distress Resp: clear to auscultation bilaterally Cardio: S1, S2 normal GI: soft, non-tender; bowel sounds normal; no masses,  no organomegaly Extremities: extremities normal, atraumatic, no cyanosis or edema  Lab Results:    @labtest @ [facility] No results found for this basename: PHART, PCO2, PO2ART, TCO2, HCO3,  in the last 72 hours CULTURES No results found for this or any previous visit (from the past 240 hour(s)). Studies/Results: Ct Head Wo Contrast  06/06/2012  *RADIOLOGY REPORT*  Clinical Data: Left-sided weakness  CT HEAD WITHOUT CONTRAST  Technique:  Contiguous axial images were obtained from the base of the skull through the vertex without contrast.  Comparison: None.  Findings: No evidence of parenchymal hemorrhage or extra-axial fluid collection. No mass lesion, mass effect, or midline shift.  No CT evidence of acute infarction.  Subcortical white matter and periventricular small vessel ischemic changes, most prominent in the subcortical left frontal lobe (series 2/image 15).  Intracranial atherosclerosis.  The visualized paranasal sinuses are essentially clear. The mastoid air cells are unopacified.  No evidence of calvarial fracture.  IMPRESSION: No evidence of acute intracranial abnormality.  Small vessel ischemic changes, as described above.    Original Report Authenticated By: Charline Bills, M.D.    Mr [facility] Wo Contrast  06/07/2012  *RADIOLOGY REPORT*  Clinical Data: Left sided headache and facial numbness.  Left-sided visual loss.  MRA HEAD WITHOUT CONTRAST  Technique: Angiographic images of the Circle of Willis were obtained using MRA technique without intravenous contrast.  Comparison: MRI brain performed concurrently.  Findings: Asymmetric signal drop out at the junction of the distal cervical internal carotid artery on the right (C2 segment) not noted on the left.  This is not clearly explained by turbulence, and a skull base RICA stenosis is not excluded.  Of note, the the caliber of the right internal carotid artery distal to this area appears normal, suggesting may be artifactual. Otherwise normal appearing petrous, cavernous, and supraclinoid right internal carotid artery.  Normal cervical, petrous, cavernous, and supraclinoid left internal carotid artery.  Normal basilar artery with vertebrals codominant.  Unremarkable anterior, middle, and posterior cerebral arteries.  No cerebellar branch occlusion.  Mild irregularity of the distal MCA and PCA branches suggests intracranial atherosclerotic change.  No visible intracranial aneurysm.  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 Otherwise unremarkable MRA intracranial circulation.   Original Report Authenticated By: Davonna Belling, M.D.    [name] Wo Contrast  06/07/2012  *RADIOLOGY REPORT*  Clinical Data: History of hypertension.  History of former cocaine abuse. Acute onset of left facial dysesthesia, also blurred vision with flashing lights.  MRI HEAD WITHOUT AND WITH CONTRAST  Technique:  Multiplanar, multiecho pulse sequences of the brain and surrounding structures were obtained according to standard protocol without and with intravenous contrast  Contrast: 20mL MULTIHANCE GADOBENATE DIMEGLUMINE 529 MG/ML IV SOLN  Comparison: CT head 06/06/2012.  MRA  head performed concurrently.  Findings: Two separate areas of restricted diffusion involve the right occipital cortex, as well as the medial periventricular white matter adjacent to the right lateral occipital horn, consistent with acute infarction.  No other areas of restricted diffusion are seen. These would fall within the right PCA territory distribution.  There is no hemorrhage, mass lesion, or hydrocephalus.  There are no foci of chronic hemorrhage.  There is mild cerebral and cerebellar atrophy.  There is mild to moderate chronic microvascular ischemic change.  There is a remote cortical and subcortical infarct in the left frontal region. There is a remote right inferior cerebellar infarct. There is a remote left occipital calcarine cortex infarct.  There is also a remote right posterior temporo-occipital cortical infarct.  A remote left thalamic lacunar infarct is seen.  The pituitary and cerebellar tonsils are unremarkable.  There are no worrisome osseous findings.  Coronal images demonstrate normal temporal lobes bilaterally.  Following the administration of contrast, there is no abnormal intracranial enhancement.  There is no acute sinus or mastoid disease.  Negative orbits.  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  No hemorrhage.  Chronic areas of infarction as described.  Mild atrophy with moderately extensive chronic microvascular ischemic change.  No areas of abnormal post contrast enhancement.   Original Report Authenticated By: Davonna Belling, M.D.    US Carotid Duplex Bilateral  06/07/2012  *RADIOLOGY REPORT*  Clinical Data: Small right PCA territory infarct  BILATERAL CAROTID DUPLEX ULTRASOUND  Technique: Wallace Cullens scale imaging, color Doppler and duplex ultrasound was performed of bilateral carotid and vertebral arteries in the neck.  Comparison:  06/07/2012 MRI  Criteria:  Quantification of carotid stenosis is based on velocity parameters that correlate the residual  internal carotid diameter with NASCET-based stenosis levels, using the diameter of the distal internal carotid lumen as the denominator for stenosis measurement.  The following velocity measurements were obtained:                   PEAK SYSTOLIC/END DIASTOLIC RIGHT ICA:                        81/18cm/sec CCA:                        115/20cm/sec SYSTOLIC ICA/CCA RATIO:     0.70 DIASTOLIC ICA/CCA RATIO:    0.92 ECA:                        214cm/sec  LEFT ICA:                        58/22cm/sec CCA:                        124/16cm/sec SYSTOLIC ICA/CCA RATIO:     0.47 DIASTOLIC ICA/CCA RATIO:    1.41 ECA:                        83cm/sec  Findings:  RIGHT CAROTID ARTERY: Diffuse intimal thickening and mild atherosclerotic changes.  No hemodynamically significant right ICA stenosis, velocity elevation, or turbulent flow.  RIGHT VERTEBRAL ARTERY:  Antegrade  LEFT CAROTID ARTERY: Diffuse intimal thickening and mild atherosclerosis as well.  No significant left ICA stenosis, velocity  elevation, or turbulent flow.  LEFT VERTEBRAL ARTERY:  Antegrade  IMPRESSION: Mild carotid intimal thickening and atherosclerotic changes.  No hemodynamically significant ICA stenosis by ultrasound.  Degree of narrowing less than 50% bilaterally.   Original Report Authenticated By: Judie Petit. Miles Costain, M.D.     Medications: I have reviewed the patient's current medications.  Assesment: 1. Probably CVA 2. CAD S/P MI 3, Hyperlipedemia  Active Problems:   * No active hospital problems. *    Plan: Will do neurology consult Physical therapy Continue regular treatment    LOS: 1 day   Raylin Diguglielmo 06/07/2012, 7:27 PM

## 2012-06-08 LAB — HOMOCYSTEINE: Homocysteine: 11.5 umol/L (ref 4.0–15.4)

## 2012-06-08 MED ORDER — SODIUM CHLORIDE 0.9 % IV SOLN
250.0000 mL | INTRAVENOUS | Status: DC | PRN
  Administered 2012-06-10: 1000 mL via INTRAVENOUS

## 2012-06-08 MED ORDER — SODIUM CHLORIDE 0.9 % IJ SOLN: 3.0000 mL | INTRAMUSCULAR | Status: DC | PRN

## 2012-06-08 MED ORDER — CLOPIDOGREL BISULFATE 75 MG PO TABS
75.0000 mg | ORAL_TABLET | Freq: Every day | ORAL | Status: DC
  Administered 2012-06-09 – 2012-06-11 (×3): 75 mg via ORAL
  Filled 2012-06-08 (×3): qty 1

## 2012-06-08 MED ORDER — SODIUM CHLORIDE 0.9 % IJ SOLN
3.0000 mL | Freq: Two times a day (BID) | INTRAMUSCULAR | Status: DC
  Administered 2012-06-08 – 2012-06-11 (×4): 3 mL via INTRAVENOUS

## 2012-06-08 NOTE — Progress Notes (Signed)
UR Chart Review Completed  

## 2012-06-08 NOTE — H&P (Signed)
[name] MRN: [id number] DOB/AGE: 1945/11/29 67 y.o. Primary Care Physician:Terie Lear, MD Admit date: 06/06/2012 Chief Complaint:  Loss of peripheral vision (lt) and lt sided weakness HPI:  This is a 67 years old male patient with history of multiple medical illnesses came to ER with the above complaint. He was evaluated and his initial CT Scan of the head showed no acute lesion. However, he was admitted as case of acute stroke for further evaluation. No headache, fever, chills, cough, nausea, vomiting, abdominal pain, dysuria, urgency or frequency of urination.  Past Medical History  Diagnosis Date  . Arteriosclerotic cardiovascular disease (ASCVD) 1998    Inferior MI->PCI of the RCA in 1998; acute anterior MI in 06/2002->  PCI of RCA and LAD with DES x2 to RCA, residual 80% ostial D1, and 80% mid CX and EF-40%; CABG-2011, LIMA-LAD, SVG to D1, OM1 & OM2; EF of 35-40% in 07/2010  . Substance abuse     formerly cocaine  . Hyperlipidemia   . Pulmonary embolism 03/2010  . Chronic anticoagulation   . Keloid     median sternotomy   Past Surgical History  Procedure Laterality Date  . Ptca  06/1996    LAD & RCA  . Coronary artery bypass graft  03/18/2010    LIMA-LAD, SVG to diagonal, OM1 & OM2         Family History  Problem Relation Age of Onset  . Cancer Mother 44     Social History:  reports that he has quit smoking. He does not have any smokeless tobacco history on file. He reports that he does not drink alcohol or use illicit drugs.    Allergies:  Allergies  Allergen Reactions  . Lisinopril     Mouth and tongue swells    Medications Prior to Admission  Medication Sig Dispense Refill  . aspirin 81 MG tablet Take 81 mg by mouth daily.       . isosorbide-hydrALAZINE (BIDIL) 20-37.5 MG per tablet Take 1 tablet by mouth 3 (three) times daily.  90 tablet  6  . metoprolol succinate (TOPROL-XL) 50 MG 24 hr tablet TAKE 1.5 TABLETS (75 MG TOTAL) BY MOUTH DAILY.  45  tablet  5  . nitroGLYCERIN (NITROSTAT) 0.4 MG SL tablet Place 0.4 mg under the tongue. Take 1 tablet under tongue at onset of chest pain; you may repeat every 5 minutes for up to 3 doses.      . simvastatin (ZOCOR) 40 MG tablet TAKE 1 TABLET BY MOUTH EVERY EVENING  30 tablet  6  . traMADol (ULTRAM) 50 MG tablet Take 50 mg by mouth every 6 (six) hours as needed. For pain. Patient takes every morning but does not take every evening           WUJ:WJXBJ from the symptoms mentioned above,there are no other symptoms referable to all systems reviewed.  Physical Exam: Blood pressure 136/75, pulse 77, temperature 97.9 F (36.6 C), temperature source Oral, resp. rate 20, height 5\' 6"  (1.676 m), weight 97.7 kg (215 lb 6.2 oz), SpO2 92.00%. General Condition- alert, awake and not in distress HE ENT- pupil  reacts sluggishly, neck supple, Respiratory- fair air entry, bilateral rhonchi CVS- S1 and S2 heard, no murmur or gallop ABD- soft and lax, bowel sound + EXT- no leg edema Neuro- alert, awake and oriented to 3X, no significant weant    Recent Labs  06/06/12 1150  WBC 5.0  NEUTROABS 2.0  HGB 16.0  HCT 47.8  MCV 88.0  PLT 150    Recent Labs  06/06/12 1150  NA 133*  K 4.1  CL 100  CO2 26  GLUCOSE 98  BUN 10  CREATININE 1.24  CALCIUM 9.3  lablast2(ast:2,ALT:2,alkphos:2,bilitot:2,prot:2,albumin:2)@    No results found for this or any previous visit (from the past 240 hour(s)).   Ct Head Wo Contrast  06/06/2012  *RADIOLOGY REPORT*  Clinical Data: Left-sided weakness  CT HEAD WITHOUT CONTRAST  Technique:  Contiguous axial images were obtained from the base of the skull through the vertex without contrast.  Comparison: None.  Findings: No evidence of parenchymal hemorrhage or extra-axial fluid collection. No mass lesion, mass effect, or midline shift.  No CT evidence of acute infarction.  Subcortical white matter and periventricular small vessel ischemic changes, most prominent in  the subcortical left frontal lobe (series 2/image 15).  Intracranial atherosclerosis.  The visualized paranasal sinuses are essentially clear. The mastoid air cells are unopacified.  No evidence of calvarial fracture.  IMPRESSION: No evidence of acute intracranial abnormality.  Small vessel ischemic changes, as described above.   Original Report Authenticated By: Charline Bills, M.D.    Mr [facility] Wo Contrast  06/07/2012  *RADIOLOGY REPORT*  Clinical Data: Left sided headache and facial numbness.  Left-sided visual loss.  MRA HEAD WITHOUT CONTRAST  Technique: Angiographic images of the Circle of Willis were obtained using MRA technique without intravenous contrast.  Comparison: MRI brain performed concurrently.  Findings: Asymmetric signal drop out at the junction of the distal cervical internal carotid artery on the right (C2 segment) not noted on the left.  This is not clearly explained by turbulence, and a skull base RICA stenosis is not excluded.  Of note, the the caliber of the right internal carotid artery distal to this area appears normal, suggesting may be artifactual. Otherwise normal appearing petrous, cavernous, and supraclinoid right internal carotid artery.  Normal cervical, petrous, cavernous, and supraclinoid left internal carotid artery.  Normal basilar artery with vertebrals codominant.  Unremarkable anterior, middle, and posterior cerebral arteries.  No cerebellar branch occlusion.  Mild irregularity of the distal MCA and PCA branches suggests intracranial atherosclerotic change.  No visible intracranial aneurysm.  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 Otherwise unremarkable MRA intracranial circulation.   Original Report Authenticated By: Davonna Belling, M.D.    [name] Wo Contrast  06/07/2012  *RADIOLOGY REPORT*  Clinical Data: History of hypertension.  History of former cocaine abuse. Acute onset of left facial dysesthesia, also blurred vision with  flashing lights.  MRI HEAD WITHOUT AND WITH CONTRAST  Technique:  Multiplanar, multiecho pulse sequences of the brain and surrounding structures were obtained according to standard protocol without and with intravenous contrast  Contrast: 20mL MULTIHANCE GADOBENATE DIMEGLUMINE 529 MG/ML IV SOLN  Comparison: CT head 06/06/2012.  MRA head performed concurrently.  Findings: Two separate areas of restricted diffusion involve the right occipital cortex, as well as the medial periventricular white matter adjacent to the right lateral occipital horn, consistent with acute infarction.  No other areas of restricted diffusion are seen. These would fall within the right PCA territory distribution.  There is no hemorrhage, mass lesion, or hydrocephalus.  There are no foci of chronic hemorrhage.  There is mild cerebral and cerebellar atrophy.  There is mild to moderate chronic microvascular ischemic change.  There is a remote cortical and subcortical infarct in the left frontal region. There is a remote right inferior cerebellar infarct. There is a remote left occipital calcarine cortex  infarct.  There is also a remote right posterior temporo-occipital cortical infarct.  A remote left thalamic lacunar infarct is seen.  The pituitary and cerebellar tonsils are unremarkable.  There are no worrisome osseous findings.  Coronal images demonstrate normal temporal lobes bilaterally.  Following the administration of contrast, there is no abnormal intracranial enhancement.  There is no acute sinus or mastoid disease.  Negative orbits.  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  No hemorrhage.  Chronic areas of infarction as described.  Mild atrophy with moderately extensive chronic microvascular ischemic change.  No areas of abnormal post contrast enhancement.   Original Report Authenticated By: Davonna Belling, M.D.    US Carotid Duplex Bilateral  06/07/2012  *RADIOLOGY REPORT*  Clinical Data: Small right PCA  territory infarct  BILATERAL CAROTID DUPLEX ULTRASOUND  Technique: Wallace Cullens scale imaging, color Doppler and duplex ultrasound was performed of bilateral carotid and vertebral arteries in the neck.  Comparison:  06/07/2012 MRI  Criteria:  Quantification of carotid stenosis is based on velocity parameters that correlate the residual internal carotid diameter with NASCET-based stenosis levels, using the diameter of the distal internal carotid lumen as the denominator for stenosis measurement.  The following velocity measurements were obtained:                   PEAK SYSTOLIC/END DIASTOLIC RIGHT ICA:                        81/18cm/sec CCA:                        115/20cm/sec SYSTOLIC ICA/CCA RATIO:     0.70 DIASTOLIC ICA/CCA RATIO:    0.92 ECA:                        214cm/sec  LEFT ICA:                        58/22cm/sec CCA:                        124/16cm/sec SYSTOLIC ICA/CCA RATIO:     0.47 DIASTOLIC ICA/CCA RATIO:    1.41 ECA:                        83cm/sec  Findings:  RIGHT CAROTID ARTERY: Diffuse intimal thickening and mild atherosclerotic changes.  No hemodynamically significant right ICA stenosis, velocity elevation, or turbulent flow.  RIGHT VERTEBRAL ARTERY:  Antegrade  LEFT CAROTID ARTERY: Diffuse intimal thickening and mild atherosclerosis as well.  No significant left ICA stenosis, velocity elevation, or turbulent flow.  LEFT VERTEBRAL ARTERY:  Antegrade  IMPRESSION: Mild carotid intimal thickening and atherosclerotic changes.  No hemodynamically significant ICA stenosis by ultrasound.  Degree of narrowing less than 50% bilaterally.   Original Report Authenticated By: Judie Petit. Miles Costain, M.D.    Impression: 1. Probably acute stroke 2.H/O CAD and S/P MI 3. Hyperlipidemia 4.H/O pulmonary Embolism  Active Problems:   * No active hospital problems. *     Plan: Neurology consult MRI of the brain and carotid doppler Physical therapy Continue regular treatment      Krissy Orebaugh Pager  [phone]  06/08/2012, 11:38 AM

## 2012-06-08 NOTE — Progress Notes (Signed)
Patient ID: [name], male   DOB: 1945/12/25, 67 y.o.   MRN: [id number]  [facility] NEUROLOGY [name] A. Brian Pilgrim, MD     [email or web address]          [name] is an 67 y.o. male.   Assessment/Plan: Acute right hemispheric infarct. Multiple bilateral large vessel cortical infarct that is worrisome for cardioembolic phenomenon. History of low ejection fraction in 2012 with an EF of 35%. Given the above, the patient will be placed on telemetry while he is hospitalized. I also think the patient should be evaluated with an cardiac monitor as outpatient setting. He has seen Dr. Dietrich Pates in the past and therefore consultation will be obtained to get this. The patient will be continued on aspirin with Plavix will be added at least for 3 months. He may need long-term anticoagulation depending on his telemetry and cardiac monitor.  The patient continues to complain of dysesthesia involving the left facial region. Dysarthria is not reported or focal weakness or numbness.  He is awake and alert. He is lucid and coherent. He does seem to have a mild dysarthria but the patient tells me this is his baseline. Pupils are reactive, extraocular movements are intact, facial muscle strength is symmetric and tongue is midline. He has good strength bilaterally.    Objective: Vital signs in last 24 hours: Temp:  [97.3 F (36.3 C)-97.9 F (36.6 C)] 97.3 F (36.3 C) (03/04 1401) Pulse Rate:  [77-87] 87 (03/04 1401) Resp:  [20] 20 (03/04 1401) BP: (134-161)/(61-77) 161/77 mmHg (03/04 1401) SpO2:  [92 %-98 %] 95 % (03/04 1401)  Intake/Output from previous day: 03/03 0701 - 03/04 0700 In: 1610 [P.O.:360; I.V.:1250] Out: -  Intake/Output this shift:   Nutritional status: Cardiac   Lab Results: Results for orders placed during the hospital encounter of 06/06/12 (from the past 48 hour(s))  TSH     Status: None   Collection Time    06/07/12  9:58 AM      Result Value Range   TSH 1.043   0.350 - 4.500 uIU/mL  VITAMIN B12     Status: None   Collection Time    06/07/12  9:58 AM      Result Value Range   Vitamin B-12 333  211 - 911 pg/mL  RPR     Status: None   Collection Time    06/07/12  9:58 AM      Result Value Range   RPR NON REACTIVE  NON REACTIVE  HOMOCYSTEINE     Status: None   Collection Time    06/07/12  9:58 AM      Result Value Range   Homocysteine 11.5  4.0 - 15.4 umol/L    Lipid Panel No results found for this basename: CHOL, TRIG, HDL, CHOLHDL, VLDL, LDLCALC,  in the last 72 hours  Studies/Results: [name] Wo Contrast  06/07/2012  *RADIOLOGY REPORT*  Clinical Data: Left sided headache and facial numbness.  Left-sided visual loss.  MRA HEAD WITHOUT CONTRAST  Technique: Angiographic images of the Circle of Willis were obtained using MRA technique without intravenous contrast.  Comparison: MRI brain performed concurrently.  Findings: Asymmetric signal drop out at the junction of the distal cervical internal carotid artery on the right (C2 segment) not noted on the left.  This is not clearly explained by turbulence, and a skull base RICA stenosis is not excluded.  Of note, the the caliber of the right internal carotid artery distal to this area  appears normal, suggesting may be artifactual. Otherwise normal appearing petrous, cavernous, and supraclinoid right internal carotid artery.  Normal cervical, petrous, cavernous, and supraclinoid left internal carotid artery.  Normal basilar artery with vertebrals codominant.  Unremarkable anterior, middle, and posterior cerebral arteries.  No cerebellar branch occlusion.  Mild irregularity of the distal MCA and PCA branches suggests intracranial atherosclerotic change.  No visible intracranial aneurysm.  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 Otherwise unremarkable MRA intracranial circulation.   Original Report Authenticated By: Davonna Belling, M.D.    [name] Wo Contrast  06/07/2012   *RADIOLOGY REPORT*  Clinical Data: History of hypertension.  History of former cocaine abuse. Acute onset of left facial dysesthesia, also blurred vision with flashing lights.  MRI HEAD WITHOUT AND WITH CONTRAST  Technique:  Multiplanar, multiecho pulse sequences of the brain and surrounding structures were obtained according to standard protocol without and with intravenous contrast  Contrast: 20mL MULTIHANCE GADOBENATE DIMEGLUMINE 529 MG/ML IV SOLN  Comparison: CT head 06/06/2012.  MRA head performed concurrently.  Findings: Two separate areas of restricted diffusion involve the right occipital cortex, as well as the medial periventricular white matter adjacent to the right lateral occipital horn, consistent with acute infarction.  No other areas of restricted diffusion are seen. These would fall within the right PCA territory distribution.  There is no hemorrhage, mass lesion, or hydrocephalus.  There are no foci of chronic hemorrhage.  There is mild cerebral and cerebellar atrophy.  There is mild to moderate chronic microvascular ischemic change.  There is a remote cortical and subcortical infarct in the left frontal region. There is a remote right inferior cerebellar infarct. There is a remote left occipital calcarine cortex infarct.  There is also a remote right posterior temporo-occipital cortical infarct.  A remote left thalamic lacunar infarct is seen.  The pituitary and cerebellar tonsils are unremarkable.  There are no worrisome osseous findings.  Coronal images demonstrate normal temporal lobes bilaterally.  Following the administration of contrast, there is no abnormal intracranial enhancement.  There is no acute sinus or mastoid disease.  Negative orbits.  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  No hemorrhage.  Chronic areas of infarction as described.  Mild atrophy with moderately extensive chronic microvascular ischemic change.  No areas of abnormal post contrast  enhancement.   Original Report Authenticated By: Davonna Belling, M.D.    US Carotid Duplex Bilateral  06/07/2012  *RADIOLOGY REPORT*  Clinical Data: Small right PCA territory infarct  BILATERAL CAROTID DUPLEX ULTRASOUND  Technique: Wallace Cullens scale imaging, color Doppler and duplex ultrasound was performed of bilateral carotid and vertebral arteries in the neck.  Comparison:  06/07/2012 MRI  Criteria:  Quantification of carotid stenosis is based on velocity parameters that correlate the residual internal carotid diameter with NASCET-based stenosis levels, using the diameter of the distal internal carotid lumen as the denominator for stenosis measurement.  The following velocity measurements were obtained:                   PEAK SYSTOLIC/END DIASTOLIC RIGHT ICA:                        81/18cm/sec CCA:                        115/20cm/sec SYSTOLIC ICA/CCA RATIO:     0.70 DIASTOLIC ICA/CCA RATIO:    0.92 ECA:  214cm/sec  LEFT ICA:                        58/22cm/sec CCA:                        124/16cm/sec SYSTOLIC ICA/CCA RATIO:     0.47 DIASTOLIC ICA/CCA RATIO:    1.41 ECA:                        83cm/sec  Findings:  RIGHT CAROTID ARTERY: Diffuse intimal thickening and mild atherosclerotic changes.  No hemodynamically significant right ICA stenosis, velocity elevation, or turbulent flow.  RIGHT VERTEBRAL ARTERY:  Antegrade  LEFT CAROTID ARTERY: Diffuse intimal thickening and mild atherosclerosis as well.  No significant left ICA stenosis, velocity elevation, or turbulent flow.  LEFT VERTEBRAL ARTERY:  Antegrade  IMPRESSION: Mild carotid intimal thickening and atherosclerotic changes.  No hemodynamically significant ICA stenosis by ultrasound.  Degree of narrowing less than 50% bilaterally.   Original Report Authenticated By: Judie Petit. Miles Costain, M.D.     Medications:  Scheduled Meds: . aspirin  325 mg Oral Daily  . enoxaparin  40 mg Subcutaneous Q24H  . isosorbide-hydrALAZINE  1 tablet Oral TID  .  simvastatin  40 mg Oral q1800  . sodium chloride  3 mL Intravenous Q12H   Continuous Infusions:  PRN Meds:.sodium chloride, nitroGLYCERIN, sodium chloride, traMADol     LOS: 2 days   Ben Habermann A. [name], M.D.  Diplomate, Biomedical engineer of Psychiatry and Neurology ( Neurology).

## 2012-06-08 NOTE — Progress Notes (Signed)
Subjective: Patient is rwesting. He had MRI which showed acute rt occipital stroke. Patient heaviness in the lt eye.  Objective: Vital signs in last 24 hours: Temp:  [97.6 F (36.4 C)-97.9 F (36.6 C)] 97.9 F (36.6 C) (03/04 0438) Pulse Rate:  [75-79] 77 (03/04 0438) Resp:  [20] 20 (03/04 0438) BP: (119-136)/(61-75) 136/75 mmHg (03/04 0438) SpO2:  [92 %-98 %] 92 % (03/04 0438) Weight change:  Last BM Date: 06/07/12  Intake/Output from previous day: 03/03 0701 - 03/04 0700 In: 360 [P.O.:360] Out: -   PHYSICAL EXAM General appearance: alert and no distress Resp: clear to auscultation bilaterally Cardio: S1, S2 normal GI: soft, non-tender; bowel sounds normal; no masses,  no organomegaly Extremities: extremities normal, atraumatic, no cyanosis or edema  Lab Results:    @labtest @ [facility] No results found for this basename: PHART, PCO2, PO2ART, TCO2, HCO3,  in the last 72 hours CULTURES No results found for this or any previous visit (from the past 240 hour(s)). Studies/Results: Ct Head Wo Contrast  06/06/2012  *RADIOLOGY REPORT*  Clinical Data: Left-sided weakness  CT HEAD WITHOUT CONTRAST  Technique:  Contiguous axial images were obtained from the base of the skull through the vertex without contrast.  Comparison: None.  Findings: No evidence of parenchymal hemorrhage or extra-axial fluid collection. No mass lesion, mass effect, or midline shift.  No CT evidence of acute infarction.  Subcortical white matter and periventricular small vessel ischemic changes, most prominent in the subcortical left frontal lobe (series 2/image 15).  Intracranial atherosclerosis.  The visualized paranasal sinuses are essentially clear. The mastoid air cells are unopacified.  No evidence of calvarial fracture.  IMPRESSION: No evidence of acute intracranial abnormality.  Small vessel ischemic changes, as described above.   Original Report Authenticated By: Charline Bills, M.D.    Mr [facility] Wo  Contrast  06/07/2012  *RADIOLOGY REPORT*  Clinical Data: Left sided headache and facial numbness.  Left-sided visual loss.  MRA HEAD WITHOUT CONTRAST  Technique: Angiographic images of the Circle of Willis were obtained using MRA technique without intravenous contrast.  Comparison: MRI brain performed concurrently.  Findings: Asymmetric signal drop out at the junction of the distal cervical internal carotid artery on the right (C2 segment) not noted on the left.  This is not clearly explained by turbulence, and a skull base RICA stenosis is not excluded.  Of note, the the caliber of the right internal carotid artery distal to this area appears normal, suggesting may be artifactual. Otherwise normal appearing petrous, cavernous, and supraclinoid right internal carotid artery.  Normal cervical, petrous, cavernous, and supraclinoid left internal carotid artery.  Normal basilar artery with vertebrals codominant.  Unremarkable anterior, middle, and posterior cerebral arteries.  No cerebellar branch occlusion.  Mild irregularity of the distal MCA and PCA branches suggests intracranial atherosclerotic change.  No visible intracranial aneurysm.  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 Otherwise unremarkable MRA intracranial circulation.   Original Report Authenticated By: Davonna Belling, M.D.    [name] Wo Contrast  06/07/2012  *RADIOLOGY REPORT*  Clinical Data: History of hypertension.  History of former cocaine abuse. Acute onset of left facial dysesthesia, also blurred vision with flashing lights.  MRI HEAD WITHOUT AND WITH CONTRAST  Technique:  Multiplanar, multiecho pulse sequences of the brain and surrounding structures were obtained according to standard protocol without and with intravenous contrast  Contrast: 20mL MULTIHANCE GADOBENATE DIMEGLUMINE 529 MG/ML IV SOLN  Comparison: CT head 06/06/2012.  MRA head performed concurrently.  Findings: Two separate areas of restricted  diffusion involve the right occipital cortex, as well as the medial periventricular white matter adjacent to the right lateral occipital horn, consistent with acute infarction.  No other areas of restricted diffusion are seen. These would fall within the right PCA territory distribution.  There is no hemorrhage, mass lesion, or hydrocephalus.  There are no foci of chronic hemorrhage.  There is mild cerebral and cerebellar atrophy.  There is mild to moderate chronic microvascular ischemic change.  There is a remote cortical and subcortical infarct in the left frontal region. There is a remote right inferior cerebellar infarct. There is a remote left occipital calcarine cortex infarct.  There is also a remote right posterior temporo-occipital cortical infarct.  A remote left thalamic lacunar infarct is seen.  The pituitary and cerebellar tonsils are unremarkable.  There are no worrisome osseous findings.  Coronal images demonstrate normal temporal lobes bilaterally.  Following the administration of contrast, there is no abnormal intracranial enhancement.  There is no acute sinus or mastoid disease.  Negative orbits.  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  No hemorrhage.  Chronic areas of infarction as described.  Mild atrophy with moderately extensive chronic microvascular ischemic change.  No areas of abnormal post contrast enhancement.   Original Report Authenticated By: Davonna Belling, M.D.    US Carotid Duplex Bilateral  06/07/2012  *RADIOLOGY REPORT*  Clinical Data: Small right PCA territory infarct  BILATERAL CAROTID DUPLEX ULTRASOUND  Technique: Wallace Cullens scale imaging, color Doppler and duplex ultrasound was performed of bilateral carotid and vertebral arteries in the neck.  Comparison:  06/07/2012 MRI  Criteria:  Quantification of carotid stenosis is based on velocity parameters that correlate the residual internal carotid diameter with NASCET-based stenosis levels, using the  diameter of the distal internal carotid lumen as the denominator for stenosis measurement.  The following velocity measurements were obtained:                   PEAK SYSTOLIC/END DIASTOLIC RIGHT ICA:                        81/18cm/sec CCA:                        115/20cm/sec SYSTOLIC ICA/CCA RATIO:     0.70 DIASTOLIC ICA/CCA RATIO:    0.92 ECA:                        214cm/sec  LEFT ICA:                        58/22cm/sec CCA:                        124/16cm/sec SYSTOLIC ICA/CCA RATIO:     0.47 DIASTOLIC ICA/CCA RATIO:    1.41 ECA:                        83cm/sec  Findings:  RIGHT CAROTID ARTERY: Diffuse intimal thickening and mild atherosclerotic changes.  No hemodynamically significant right ICA stenosis, velocity elevation, or turbulent flow.  RIGHT VERTEBRAL ARTERY:  Antegrade  LEFT CAROTID ARTERY: Diffuse intimal thickening and mild atherosclerosis as well.  No significant left ICA stenosis, velocity elevation, or turbulent flow.  LEFT VERTEBRAL ARTERY:  Antegrade  IMPRESSION: Mild carotid intimal thickening and atherosclerotic changes.  No hemodynamically significant ICA stenosis by ultrasound.  Degree of narrowing less than 50% bilaterally.   Original Report Authenticated By: Judie Petit. Miles Costain, M.D.     Medications: I have reviewed the patient's current medications.  Assesment: 1. Probably CVA 2. CAD S/P MI 3, Hyperlipedemia  Active Problems:   * No active hospital problems. *    Plan: Will do neurology consult appreciated Physical therapy Continue regular treatment    LOS: 2 days   Kristelle Cavallaro 06/08/2012, 8:37 AM

## 2012-06-09 DIAGNOSIS — I517 Cardiomegaly: Secondary | ICD-10-CM

## 2012-06-09 DIAGNOSIS — I2510 Atherosclerotic heart disease of native coronary artery without angina pectoris: Secondary | ICD-10-CM

## 2012-06-09 DIAGNOSIS — G459 Transient cerebral ischemic attack, unspecified: Secondary | ICD-10-CM

## 2012-06-09 DIAGNOSIS — I639 Cerebral infarction, unspecified: Secondary | ICD-10-CM

## 2012-06-09 DIAGNOSIS — I429 Cardiomyopathy, unspecified: Secondary | ICD-10-CM

## 2012-06-09 DIAGNOSIS — I7090 Unspecified atherosclerosis: Secondary | ICD-10-CM

## 2012-06-09 DIAGNOSIS — I10 Essential (primary) hypertension: Secondary | ICD-10-CM

## 2012-06-09 NOTE — Progress Notes (Signed)
*  PRELIMINARY RESULTS* Echocardiogram 2D Echocardiogram has been performed.  [name] 06/09/2012, 9:19 AM

## 2012-06-09 NOTE — Procedures (Signed)
[facility] Rainbow Salman A. [name], MD     [email or web address]        [name], [name]               ACCOUNT NO.:  [id number]  MEDICAL RECORD NO.:  [id number]  LOCATION:  EE                           FACILITY:  [facility]  PHYSICIAN:  Aileene Lanum A. [name], M.D. DATE OF BIRTH:  [birth year]  DATE OF PROCEDURE:  06/07/2012 DATE OF DISCHARGE:  06/07/2012                             EEG INTERPRETATION   INDICATIONS:  This is a 67 year old man who presents with unusual facial sensation of swelling and discomfort along with sensory symptoms suspicious for seizures.  MEDICATIONS:  Toprol, Ultram, Zocor, aspirin.  ANALYSIS:  A 16 channel recording using standard 10/20 measurements is conducted for 22 minutes.  There is a well-formed posterior dominant rhythm of 9-1/2 to 10 Hz with attenuates with eye opening.  There is beta activity observed in the frontal areas.  Awake and sleep activities are recorded.  Complexes and sleep spindles are observed.  Photic stimulation is carried out without abnormal changes in the background activity.  There is no focal or lateralized slowing.  There is no epileptiform activity observed.  IMPRESSION:  Normal recording of awake and sleep states.     Valinda Fedie A. [name], M.D.     KAD/MEDQ  D:  06/09/2012  T:  06/09/2012  Job:  [id number]

## 2012-06-09 NOTE — Consult Note (Signed)
CARDIOLOGY CONSULT NOTE     Patient ID: [name] MRN: [id number] DOB/AGE: 1946/02/01 67 y.o.  Admit date: 06/06/2012 Referring Physician:Fanta, MD Primary PhysicianFANTA,TESFAYE, MD Primary Cardiologist:[name] Reason for Consultation: Questionable need for OP cardiac monitor in the setting of CVA with  Systolic dysfunction, worrisome for cardioembolic event.  HPI: [name] is a 67 year old patient admitted with acute CVA after having symptoms of loss of peripheral vision on the left side with left-sided weakness. MRI  of the brain demonstrated acute right occipital cortical and subcortical infarction without hemorrhage. He has been seen by Neurology who is requesting cardiac consultation.  The patient states his symptoms began 3 days ago. He states he had a funny feeling when he awoke on Sunday morning, with some vision disturbances but no blindness in the left eye, and a uncomfortable feeling on the left side of his chest in the axillary area, with some tingling. He stated that he had some low-grade nausea as well. He states 2 days prior to this event he was struggling with a Oceanographer dog who was hard to control. He states he began to feel chest pressure during that time and was very exhausted after releasing the dog. He was tired the following day, on Saturday, but states that it did not last long he was back to his usual health. He denied any chest discomfort, palpitations, dizziness, or significant dyspnea associated with event on Sunday.  [name] is normally followed by [name] with history of CAD, CABG in 2011 and PCI of the RCA and LAD in 2004. He has a history of pulmonary embolism but is no longer on Coumadin with discontinuation by [name] in October 2013 as he had been on it for one year. He has a history of ischemic cardiomyopathy with an ejection fraction of 35 to 40% with akinesis of the mid and distal anterior and apical myocardium, and mild to moderate LVH.  Echocardiogram on this admission is completed with results pending.       Review of systems complete and found to be negative unless listed above   Past Medical History  Diagnosis Date  . Arteriosclerotic cardiovascular disease (ASCVD) 1998    Inferior MI->PCI of the RCA in 1998; acute anterior MI in 06/2002->  PCI of RCA and LAD with DES x2 to RCA, residual 80% ostial D1, and 80% mid CX and EF-40%; CABG-2011, LIMA-LAD, SVG to D1, OM1 & OM2; EF of 35-40% in 07/2010  . Substance abuse     formerly cocaine  . Hyperlipidemia   . Pulmonary embolism 03/2010  . Chronic anticoagulation   . Keloid     median sternotomy    Family History  Problem Relation Age of Onset  . Cancer Mother 67    History   Social History  . Marital Status: Widowed    Spouse Name: N/A    Number of Children: N/A  . Years of Education: N/A   Occupational History  . Not on file.   Social History Main Topics  . Smoking status: Former Games developer  . Smokeless tobacco: Not on file  . Alcohol Use: No  . Drug Use: No     Comment: Former cocaine abuse  . Sexually Active: Not on file   Other Topics Concern  . Not on file   Social History Narrative  . No narrative on file    Past Surgical History  Procedure Laterality Date  . Ptca  06/1996    LAD & RCA  .  Coronary artery bypass graft  03/18/2010    LIMA-LAD, SVG to diagonal, OM1 & OM2     Prescriptions prior to admission  Medication Sig Dispense Refill  . aspirin 81 MG tablet Take 81 mg by mouth daily.       . isosorbide-hydrALAZINE (BIDIL) 20-37.5 MG per tablet Take 1 tablet by mouth 3 (three) times daily.  90 tablet  6  . metoprolol succinate (TOPROL-XL) 50 MG 24 hr tablet TAKE 1.5 TABLETS (75 MG TOTAL) BY MOUTH DAILY.  45 tablet  5  . nitroGLYCERIN (NITROSTAT) 0.4 MG SL tablet Place 0.4 mg under the tongue. Take 1 tablet under tongue at onset of chest pain; you may repeat every 5 minutes for up to 3 doses.      . simvastatin (ZOCOR) 40 MG tablet TAKE 1  TABLET BY MOUTH EVERY EVENING  30 tablet  6  . traMADol (ULTRAM) 50 MG tablet Take 50 mg by mouth every 6 (six) hours as needed. For pain. Patient takes every morning but does not take every evening        Physical Exam: Blood pressure 120/68, pulse 67, temperature 97.9 F (36.6 C), temperature source Oral, resp. rate 18, height 5\' 6"  (1.676 m), weight 215 lb 6.2 oz (97.7 kg), SpO2 93.00%.   General: Well developed, well nourished, in no acute distress, difficult to understand at times. Head: Eyes PERRLA, No xanthomas.   Normal cephalic and atramatic  Lungs: Clear bilaterally to auscultation and percussion. Heart: HRRR S1 S2, without MRG.  Pulses are 2+ & equal.            No carotid bruit. No JVD.  No abdominal bruits. No femoral bruits. Abdomen: Bowel sounds are positive, abdomen soft and non-tender without masses or                  Hernia's noted. Msk:  Back normal, normal gait. Normal strength and tone for age. Extremities: No clubbing, cyanosis or edema.  DP +1 Neuro: Alert and oriented X 3. No neuro-deficits. Psych:  Good affect, responds appropriately  Labs:   Lab Results  Component Value Date   WBC 5.0 06/06/2012   HGB 16.0 06/06/2012   HCT 47.8 06/06/2012   MCV 88.0 06/06/2012   PLT 150 06/06/2012     Recent Labs Lab 06/06/12 1150  NA 133*  K 4.1  CL 100  CO2 26  BUN 10  CREATININE 1.24  CALCIUM 9.3  PROT 7.5  BILITOT 1.3*  ALKPHOS 53  ALT 24  AST 21  GLUCOSE 98   Lab Results  Component Value Date   CKTOTAL 160 07/27/2010   CKMB 2.8 07/27/2010   TROPONINI <0.30 06/06/2012    Echocardiogram: 04/08/1010  Left ventricle: The cavity size was normal. Mild to moderate LVH. Systolic function was moderately reduced. The estimated ejection fraction was in the range of 35% to 40%. Akinesis of the mid-distal anterior and apical myocardium. - Mitral valve: Calcified annulus. - Right ventricle: The cavity size was normal. Wall thickness was mildly to moderately  increased.  Radiology: Mr Shirlee Latch HY Contrast  06/07/2012  *RADIOLOGY REPORT*  Clinical Data: Left sided headache and facial numbness.  Left-sided visual loss.  MRA HEAD WITHOUT CONTRAST    IMPRESSION: Equivocal right internal carotid artery C2 segment stenosis. Otherwise unremarkable MRA intracranial circulation.   Original Report Authenticated By: Davonna Belling, M.D.    [name] Wo Contrast  06/07/2012  *RADIOLOGY REPORT*  Clinical Data: History of hypertension.  History of former cocaine abuse. Acute onset of left facial dysesthesia, also blurred vision with flashing lights.  MRI HEAD WITHOUT AND WITH CONTRAST   IMPRESSION: Acute right occipital cortical and subcortical infarction.  No hemorrhage.  Chronic areas of infarction as described.  Mild atrophy with moderately extensive chronic microvascular ischemic change.  No areas of abnormal post contrast enhancement.   Original Report Authenticated By: Davonna Belling, M.D.    US Carotid Duplex Bilateral  06/07/2012  *RADIOLOGY REPORT*  Clinical Data: Small right PCA territory infarct  BILATERAL CAROTID DUPLEX ULTRASOUND  Technique: Wallace Cullens scale imaging, color Doppler and duplex ultrasound was performed of bilateral carotid and vertebral arteries in the neck.  Comparison:  06/07/2012 MRI  Criteria:  Quantification of carotid stenosis is based on velocity parameters that correlate the residual internal carotid diameter with NASCET-based stenosis levels, using the diameter of the distal internal carotid lumen as the denominator for stenosis measurement.  The following velocity measurements were obtained:                   PEAK SYSTOLIC/END DIASTOLIC RIGHT ICA:                        81/18cm/sec CCA:                        115/20cm/sec SYSTOLIC ICA/CCA RATIO:     0.70 DIASTOLIC ICA/CCA RATIO:    0.92 ECA:                        214cm/sec  LEFT ICA:                        58/22cm/sec CCA:                        124/16cm/sec SYSTOLIC ICA/CCA RATIO:     0.47 DIASTOLIC  ICA/CCA RATIO:    1.41 ECA:                        83cm/sec  Findings:  RIGHT CAROTID ARTERY: Diffuse intimal thickening and mild atherosclerotic changes.  No hemodynamically significant right ICA stenosis, velocity elevation, or turbulent flow.  RIGHT VERTEBRAL ARTERY:  Antegrade  LEFT CAROTID ARTERY: Diffuse intimal thickening and mild atherosclerosis as well.  No significant left ICA stenosis, velocity elevation, or turbulent flow.  LEFT VERTEBRAL ARTERY:  Antegrade  IMPRESSION: Mild carotid intimal thickening and atherosclerotic changes.  No hemodynamically significant ICA stenosis by ultrasound.  Degree of narrowing less than 50% bilaterally.   Original Report Authenticated By: Judie Petit. Shick, M.D.    EKG:NSR with T-wave inversion in the lateral leads. Evidence of prior anterior infarct.   ASSESSMENT AND PLAN:   1. Acute Right Occipital Cortical and Subcortical Infarction:  Followed by Dr. [name] who is concerned that cardioembolic event may have caused this incident. The patient had been on Coumadin in the past but this was discontinued in October after being on this for over a year in the setting of pulmonary emboli. No evidence for hemorrhage on MRA. Echocardiogram has been completed but not read at this time. He is feeling much better now. We will not restart yet Coumadin unless warranted. He has been placed on Plavix per neurology.  2. Systolic Dysfunction: Most recent echocardiogram completed in January 2012 demonstrated ejection fraction of 35-40% with moderate LVH. Will  review echocardiogram for evaluation of LV function changes.  3. CAD: Known history of coronary artery bypass grafting in 2011 and PCI of the RCA and LAD in 2004. He is currently beta blocker, aspirin, and statin. 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 Troponin is negative x1, with no acute changes on EKG indicative of ACS.  4.  Hypertension: Blood pressure has been well maintained during hospitalization. On initial evaluation ER he was not found to be significantly hypertensive. Blood pressure has been in the 120 systolic during hospitalization. He continues on isosorbide hydralazine (BiDil)  as he was taking prior to admission. He denies medical noncompliance.  Signed: Bettey Mare. Lyman Bishop NP [facility] 06/09/2012, 8:38 AM Co-Sign MD  Attending note:  Patient seen and examined. Modified above note by Ms. Lawrence NP. Reviewed records and present hospital stay. Patient diagnosed with acute right occipital cortical and subcortical stroke, nonhemorrhagic, no evidence of significant carotid disease, and cardiac monitoring demonstrating no atrial arrhythmias. He does have a history of cardiomyopathy, previous LVEF 35-40% as of 2012, underlying CAD status post previous bypass. Also had some chest pain symptoms, however cardiac markers are normal, and ECG shows no dynamic ST segment changes.  On examination he is comfortable, blood pressure 120/88, heart rate 67 in sinus rhythm. Denies any swallowing deficits, ate breakfast this morning. Lungs are clear, cardiac exam with regular rate and rhythm, indistinct PMI, no gallop.  Lab work reviewed, potassium 4.1, creatinine 1.2, troponin I less than 0.3, hemoglobin 16, platelets 150.  Reviewed with patient and family present. We will review the transthoracic echocardiogram ordered for today. Unless there is a definitive abnormality that would require anticoagulation based on that study (such as a mural thrombus), may need to go ahead and perform a TEE tomorrow for definitive exclusion of left atrial appendage thrombus and also to assess the aortic arch and ascending aorta. An outpatient monitor could also be considered, if above is negative.  We will keep him n.p.o. after midnight with tentative plan for TEE tomorrow.  Jonelle Sidle, M.D., F.A.C.C.

## 2012-06-09 NOTE — Progress Notes (Signed)
Subjective: Patient is resting. No specific complaint. Neurology has requested for cardiology evaluation.  Objective: Vital signs in last 24 hours: Temp:  [97.3 F (36.3 C)-97.9 F (36.6 C)] 97.9 F (36.6 C) (03/05 0521) Pulse Rate:  [67-87] 67 (03/05 0521) Resp:  [18-20] 18 (03/05 0521) BP: (120-161)/(68-77) 120/68 mmHg (03/05 0521) SpO2:  [93 %-95 %] 93 % (03/05 0521) Weight change:  Last BM Date: 06/08/12  Intake/Output from previous day: 03/04 0701 - 03/05 0700 In: 1083 [P.O.:1080; I.V.:3] Out: -   PHYSICAL EXAM General appearance: alert and no distress Resp: clear to auscultation bilaterally Cardio: S1, S2 normal GI: soft, non-tender; bowel sounds normal; no masses,  no organomegaly Extremities: extremities normal, atraumatic, no cyanosis or edema  Lab Results:    @labtest @ [facility] No results found for this basename: PHART, PCO2, PO2ART, TCO2, HCO3,  in the last 72 hours CULTURES No results found for this or any previous visit (from the past 240 hour(s)). Studies/Results: Mr Shirlee Latch JW Contrast  06/07/2012  *RADIOLOGY REPORT*  Clinical Data: Left sided headache and facial numbness.  Left-sided visual loss.  MRA HEAD WITHOUT CONTRAST  Technique: Angiographic images of the Circle of Willis were obtained using MRA technique without intravenous contrast.  Comparison: MRI brain performed concurrently.  Findings: Asymmetric signal drop out at the junction of the distal cervical internal carotid artery on the right (C2 segment) not noted on the left.  This is not clearly explained by turbulence, and a skull base RICA stenosis is not excluded.  Of note, the the caliber of the right internal carotid artery distal to this area appears normal, suggesting may be artifactual. Otherwise normal appearing petrous, cavernous, and supraclinoid right internal carotid artery.  Normal cervical, petrous, cavernous, and supraclinoid left internal carotid artery.  Normal basilar artery with  vertebrals codominant.  Unremarkable anterior, middle, and posterior cerebral arteries.  No cerebellar branch occlusion.  Mild irregularity of the distal MCA and PCA branches suggests intracranial atherosclerotic change.  No visible intracranial aneurysm.  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 Otherwise unremarkable MRA intracranial circulation.   Original Report Authenticated By: Davonna Belling, M.D.    [name] Wo Contrast  06/07/2012  *RADIOLOGY REPORT*  Clinical Data: History of hypertension.  History of former cocaine abuse. Acute onset of left facial dysesthesia, also blurred vision with flashing lights.  MRI HEAD WITHOUT AND WITH CONTRAST  Technique:  Multiplanar, multiecho pulse sequences of the brain and surrounding structures were obtained according to standard protocol without and with intravenous contrast  Contrast: 20mL MULTIHANCE GADOBENATE DIMEGLUMINE 529 MG/ML IV SOLN  Comparison: CT head 06/06/2012.  MRA head performed concurrently.  Findings: Two separate areas of restricted diffusion involve the right occipital cortex, as well as the medial periventricular white matter adjacent to the right lateral occipital horn, consistent with acute infarction.  No other areas of restricted diffusion are seen. These would fall within the right PCA territory distribution.  There is no hemorrhage, mass lesion, or hydrocephalus.  There are no foci of chronic hemorrhage.  There is mild cerebral and cerebellar atrophy.  There is mild to moderate chronic microvascular ischemic change.  There is a remote cortical and subcortical infarct in the left frontal region. There is a remote right inferior cerebellar infarct. There is a remote left occipital calcarine cortex infarct.  There is also a remote right posterior temporo-occipital cortical infarct.  A remote left thalamic lacunar infarct is seen.  The pituitary and cerebellar tonsils are unremarkable.  There are no worrisome osseous  findings.  Coronal images demonstrate normal temporal lobes bilaterally.  Following the administration of contrast, there is no abnormal intracranial enhancement.  There is no acute sinus or mastoid disease.  Negative orbits.  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  No hemorrhage.  Chronic areas of infarction as described.  Mild atrophy with moderately extensive chronic microvascular ischemic change.  No areas of abnormal post contrast enhancement.   Original Report Authenticated By: Davonna Belling, M.D.    US Carotid Duplex Bilateral  06/07/2012  *RADIOLOGY REPORT*  Clinical Data: Small right PCA territory infarct  BILATERAL CAROTID DUPLEX ULTRASOUND  Technique: Wallace Cullens scale imaging, color Doppler and duplex ultrasound was performed of bilateral carotid and vertebral arteries in the neck.  Comparison:  06/07/2012 MRI  Criteria:  Quantification of carotid stenosis is based on velocity parameters that correlate the residual internal carotid diameter with NASCET-based stenosis levels, using the diameter of the distal internal carotid lumen as the denominator for stenosis measurement.  The following velocity measurements were obtained:                   PEAK SYSTOLIC/END DIASTOLIC RIGHT ICA:                        81/18cm/sec CCA:                        115/20cm/sec SYSTOLIC ICA/CCA RATIO:     0.70 DIASTOLIC ICA/CCA RATIO:    0.92 ECA:                        214cm/sec  LEFT ICA:                        58/22cm/sec CCA:                        124/16cm/sec SYSTOLIC ICA/CCA RATIO:     0.47 DIASTOLIC ICA/CCA RATIO:    1.41 ECA:                        83cm/sec  Findings:  RIGHT CAROTID ARTERY: Diffuse intimal thickening and mild atherosclerotic changes.  No hemodynamically significant right ICA stenosis, velocity elevation, or turbulent flow.  RIGHT VERTEBRAL ARTERY:  Antegrade  LEFT CAROTID ARTERY: Diffuse intimal thickening and mild atherosclerosis as well.  No significant left ICA stenosis,  velocity elevation, or turbulent flow.  LEFT VERTEBRAL ARTERY:  Antegrade  IMPRESSION: Mild carotid intimal thickening and atherosclerotic changes.  No hemodynamically significant ICA stenosis by ultrasound.  Degree of narrowing less than 50% bilaterally.   Original Report Authenticated By: Judie Petit. Miles Costain, M.D.     Medications: I have reviewed the patient's current medications.  Assesment: 1. Probably CVA 2. CAD S/P MI 3, Hyperlipedemia  Active Problems:   * No active hospital problems. *    Plan: Cardiology evaluation requested Physical therapy Continue regular treatment    LOS: 3 days   Tacoya Altizer 06/09/2012, 7:39 AM

## 2012-06-10 ENCOUNTER — Encounter (HOSPITAL_COMMUNITY): Payer: Self-pay | Admitting: *Deleted

## 2012-06-10 ENCOUNTER — Encounter (HOSPITAL_COMMUNITY)
Admission: EM | Disposition: A | Discharge status: Home / Self Care | Payer: Self-pay | Source: Home / Self Care | Attending: Internal Medicine

## 2012-06-10 DIAGNOSIS — I059 Rheumatic mitral valve disease, unspecified: Secondary | ICD-10-CM

## 2012-06-10 DIAGNOSIS — I6350 Cerebral infarction due to unspecified occlusion or stenosis of unspecified cerebral artery: Principal | ICD-10-CM

## 2012-06-10 HISTORY — PX: TEE WITHOUT CARDIOVERSION: SHX5443

## 2012-06-10 LAB — BASIC METABOLIC PANEL
BUN: 10 mg/dL (ref 6–23)
CO2: 28 mEq/L (ref 19–32)
Calcium: 9.2 mg/dL (ref 8.4–10.5)
Chloride: 99 mEq/L (ref 96–112)
Creatinine, Ser: 1.27 mg/dL (ref 0.50–1.35)
GFR calc Af Amer: 67 mL/min — ABNORMAL LOW (ref >90)
GFR calc non Af Amer: 58 mL/min — ABNORMAL LOW (ref >90)
Glucose, Bld: 97 mg/dL (ref 70–99)
Potassium: 3.8 mEq/L (ref 3.5–5.1)
Sodium: 136 mEq/L (ref 135–145)

## 2012-06-10 LAB — MAGNESIUM: Magnesium: 1.9 mg/dL (ref 1.5–2.5)

## 2012-06-10 SURGERY — ECHOCARDIOGRAM, TRANSESOPHAGEAL
Anesthesia: Moderate Sedation

## 2012-06-10 MED ORDER — MIDAZOLAM HCL 5 MG/5ML IJ SOLN
INTRAMUSCULAR | Status: AC
  Filled 2012-06-10: qty 5

## 2012-06-10 MED ORDER — PHENOL 1.4 % MT LIQD
1.0000 | OROMUCOSAL | Status: DC | PRN
  Administered 2012-06-10: 1 via OROMUCOSAL
  Filled 2012-06-10: qty 177

## 2012-06-10 MED ORDER — FENTANYL CITRATE 0.05 MG/ML IJ SOLN
INTRAMUSCULAR | Status: AC
  Filled 2012-06-10: qty 2

## 2012-06-10 MED ORDER — FENTANYL CITRATE 0.05 MG/ML IJ SOLN
INTRAMUSCULAR | Status: DC | PRN
  Administered 2012-06-10: 25 ug via INTRAVENOUS

## 2012-06-10 MED ORDER — STROKE: EARLY STAGES OF RECOVERY BOOK
Freq: Once | Status: AC
  Administered 2012-06-10: 10:00:00
  Filled 2012-06-10: qty 1

## 2012-06-10 MED ORDER — MIDAZOLAM HCL 5 MG/5ML IJ SOLN
INTRAMUSCULAR | Status: DC | PRN
  Administered 2012-06-10 (×2): 2 mg via INTRAVENOUS

## 2012-06-10 NOTE — Interval H&P Note (Signed)
History and Physical Interval Note:  06/10/2012 11:16 AM  [name]  has presented today for surgery, with the diagnosis of a-fib  The various methods of treatment have been discussed with the patient and family. After consideration of risks, benefits and other options for treatment, the patient has consented to  Procedure(s): TRANSESOPHAGEAL ECHOCARDIOGRAM (TEE) (N/A) as a surgical intervention .  The patient's history has been reviewed, patient examined, no change in status, stable for surgery.  I have reviewed the patient's chart and labs.  Questions were answered to the patient's satisfaction.     Charlton Haws

## 2012-06-10 NOTE — Progress Notes (Signed)
Subjective: Patient is resting. He is schedualed for TEE today. No new complaint. Objective: Vital signs in last 24 hours: Temp:  [97.3 F (36.3 C)-98.3 F (36.8 C)] 97.6 F (36.4 C) (03/06 0617) Pulse Rate:  [79-100] 80 (03/06 0617) Resp:  [18-20] 18 (03/06 0617) BP: (121-131)/(68-85) 121/70 mmHg (03/06 0617) SpO2:  [98 %-100 %] 100 % (03/06 0617) Weight change:  Last BM Date: 06/09/12  Intake/Output from previous day: 03/05 0701 - 03/06 0700 In: 240 [P.O.:240] Out: -   PHYSICAL EXAM General appearance: alert and no distress Resp: clear to auscultation bilaterally Cardio: S1, S2 normal GI: soft, non-tender; bowel sounds normal; no masses,  no organomegaly Extremities: extremities normal, atraumatic, no cyanosis or edema  Lab Results:    @labtest @ [facility] No results found for this basename: PHART, PCO2, PO2ART, TCO2, HCO3,  in the last 72 hours CULTURES No results found for this or any previous visit (from the past 240 hour(s)). Studies/Results: No results found.  Medications: I have reviewed the patient's current medications.  Assesment: 1. Probably CVA 2. CAD S/P MI 3, Hyperlipedemia  Active Problems:   HYPERLIPIDEMIA   HYPERTENSION   Arteriosclerotic cardiovascular disease (ASCVD)   Pulmonary embolism   CVA (cerebral infarction)   Secondary cardiomyopathy, unspecified    Plan: Cardiology appreciated TEE as scheduled by cardiology Continue regular treatment    LOS: 4 days   [name] 06/10/2012, 7:32 AM

## 2012-06-10 NOTE — Progress Notes (Signed)
Patient ID: LAKEN LOBATO, male   DOB: 12/07/1945, 67 y.o.   MRN: [id number]  [facility] NEUROLOGY Soul Hackman A. [name], MD     [email or web address]          Trentan L Mcanany is an 67 y.o. male.   Assessment/Plan: No new complaints are reported today. He continues to have dysesthesia is involving the left facial region. His echo results are listed below and shows reduced ejection fraction but no thrombus. He is awake and alert. He is lucid and coherent. Speech is normal for the patient. He does have normal facial muscles both in symmetry and strength. He has good strength bilaterally. Visual fields are intact. He is due to have an echo via the transesophageal route today. Telemetry shows normal sinus rhythm. The patient to continue with aspirin and Plavix combination for now until further evaluation has been obtained from his TEE. I also think the patient may need an event monitor if the TEE is unremarkable.     Objective: Vital signs in last 24 hours: Temp:  [97.3 F (36.3 C)-98.3 F (36.8 C)] 97.6 F (36.4 C) (03/06 0617) Pulse Rate:  [79-100] 80 (03/06 0617) Resp:  [18-20] 18 (03/06 0617) BP: (121-131)/(68-85) 121/70 mmHg (03/06 0617) SpO2:  [98 %-100 %] 100 % (03/06 0617)  Intake/Output from previous day: 03/05 0701 - 03/06 0700 In: 240 [P.O.:240] Out: -  Intake/Output this shift:   Nutritional status: NPO   Lab Results: No results found for this or any previous visit (from the past 48 hour(s)).  Lipid Panel No results found for this basename: CHOL, TRIG, HDL, CHOLHDL, VLDL, LDLCALC,  in the last 72 hours  Studies/Results: No results found.  Medications:  Scheduled Meds: .  stroke: mapping our early stages of recovery book   Does not apply Once  . aspirin  325 mg Oral Daily  . clopidogrel  75 mg Oral Q breakfast  . enoxaparin  40 mg Subcutaneous Q24H  . isosorbide-hydrALAZINE  1 tablet Oral TID  . simvastatin  40 mg Oral q1800  . sodium chloride  3 mL Intravenous  Q12H   Continuous Infusions:  PRN Meds:.sodium chloride, nitroGLYCERIN, sodium chloride, traMADol  ECHO-TTE  - Left ventricle: The cavity size was normal. Wall thickness was increased in a pattern of mild LVH. Systolic function was mildly reduced. The estimated ejection fraction was in the range of 45% to 50%. There is akinesis of the mid-distalanteroseptal myocardium. No mural thrombus visualized. Doppler parameters are consistent with abnormal left ventricular relaxation (grade 1 diastolic dysfunction). Compared to previous study 2012, LV systolic function has improved. - Aortic valve: Mildly calcified annulus. Trileaflet. - Mitral valve: Calcified annulus. Mildly thickened leaflets . Trivial regurgitation. - Left atrium: The atrium was mildly dilated. - Atrial septum: Poorly visualized. Not adequately evaluated for PFO/ASD. - Pulmonary arteries: Systolic pressure could not be accurately estimated. - Inferior vena cava: Poorly visualized. - Pericardium, extracardiac: There was no pericardial effusion.       LOS: 4 days   Kristine Chahal A. [name], M.D.  Diplomate, Biomedical engineer of Psychiatry and Neurology ( Neurology).

## 2012-06-10 NOTE — Progress Notes (Signed)
Spoke with Dr. Felecia Shelling, he states pt will be d/ced tomorrow in the am.

## 2012-06-10 NOTE — CV Procedure (Signed)
Tee:  4 mg versed and 25 ug of fentanyl  EF 50-55% no apical thrombus Mild LAE no LAA thrombus Mild MR Normal AV Normal RA/RV Redundant and mobile atrial septum No PFO/ASD by 2D and color flow but PFO by bubble study small with right to left shunt No pericardial effusion Mild TR  Charlton Haws

## 2012-06-10 NOTE — Progress Notes (Signed)
SUBJECTIVE: No new complaints. No recurrence of numbness tingling or weakness on the left side. He has been kept n.p.o. for TEE today.  Active Problems:   HYPERLIPIDEMIA   HYPERTENSION   Arteriosclerotic cardiovascular disease (ASCVD)   Pulmonary embolism   CVA (cerebral infarction)   Secondary cardiomyopathy, unspecified   Thyroid Function Tests:  Recent Labs  06/07/12 0958  TSH 1.043   Echocardiogram 3.5.14 Left ventricle: The cavity size was normal. Wall thickness was increased in a pattern of mild LVH. Systolic function was mildly reduced. The estimated ejection fraction was in the range of 45% to 50%. There is akinesis of the mid-distalanteroseptal myocardium. No mural thrombus visualized. Doppler parameters are consistent with abnormal left ventricular relaxation (grade 1 diastolic dysfunction). Compared to previous study 2012, LV systolic function has improved. - Aortic valve: Mildly calcified annulus. Trileaflet. - Mitral valve: Calcified annulus. Mildly thickened leaflets . Trivial regurgitation. - Left atrium: The atrium was mildly dilated. - Atrial septum: Poorly visualized. Not adequately evaluated for PFO/ASD. - Pulmonary arteries: Systolic pressure could not be accurately estimated. - Inferior vena cava: Poorly visualized. - Pericardium, extracardiac: There was no pericardial effusion.    RADIOLOGY:  PHYSICAL EXAM BP 121/70  Pulse 80  Temp(Src) 97.6 F (36.4 C) (Oral)  Resp 18  Ht 5\' 6"  (1.676 m)  Wt 215 lb 6.2 oz (97.7 kg)  BMI 34.78 kg/m2  SpO2 100% General: Well developed, well nourished, in no acute distress Head: Eyes PERRLA, No xanthomas.   Normal cephalic and atramatic  Lungs: Clear bilaterally to auscultation and percussion. Heart: HRRR S1 S2, No MRG .  Pulses are 2+ & equal.            No carotid bruit. No JVD.  No abdominal bruits. No femoral bruits. Abdomen: Bowel sounds are positive, abdomen soft and non-tender without masses or                   Hernia's noted. Msk:  Back normal, normal gait. Normal strength and tone for age. Extremities: No clubbing, cyanosis or edema.  DP +1 Neuro: Alert and oriented X 3. Psych:  Good affect, responds appropriately  TELEMETRY: Reviewed telemetry pt in normal sinus rhythm, rates in the 70s to 80s.  ASSESSMENT AND PLAN: 1. Acute Right Occipital Cortical and Subcortical Infarction: Followed by Dr. [name] who is concerned that cardioembolic event may have caused this incident. The patient had been on Coumadin in the past but this was discontinued in October after being on this for over a year in the setting of pulmonary emboli. No evidence for hemorrhage on MRA. He is now on Plavix per neurology. TEE is planned today with Dr. Eden Emms to rule out cardiac embolus. Echocardiogram completed yesterday did not show evidence of mural thrombus. If thrombus is not found on TEE, would recommend discharge with followup in our office once cleared by neurology. 2. Systolic Dysfunction: Echocardiogram completed on 06/10/2012 demonstrated improvement of LV function to 45%-50% compared to ejection fraction noted in January of 2013 with an EF of 35%. We will continue current medication regimen as he appears to be responding well to this. 3. CAD: Known history of coronary artery bypass grafting in 2011 and PCI of the RCA and LAD in 2004. He is currently beta blocker, aspirin, and statin. 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 Troponin is negative x1, with no acute changes on EKG indicative  of ACS.  4. Hypertension: Blood pressure has been well maintained during hospitalization. On initial evaluation ER he was not found to be significantly hypertensive. Blood pressure has been in the 120 systolic during hospitalization. He continues on isosorbide hydralazine (BiDil) as he was taking prior to admission. He denies  medical noncompliance.      Bettey Mare. Lyman Bishop NP [facility] 06/10/2012, 8:37 AM  Have scheduled TEE for 12:30 today.  Particularly with no arrhythmia on telemetry and improved EF on TTE yield for SOE will be low  Charlton Haws

## 2012-06-10 NOTE — Progress Notes (Signed)
*  PRELIMINARY RESULTS* Echocardiogram Echocardiogram Transesophageal has been performed.  [name] 06/10/2012, 2:27 PM

## 2012-06-10 NOTE — Progress Notes (Signed)
   HPI  Allergies  Allergen Reactions  . Lisinopril     Mouth and tongue swells    Current Facility-Administered Medications  Medication Dose Route Frequency Provider Last Rate Last Dose  . 0.9 %  sodium chloride infusion  250 mL Intravenous PRN Avon Gully, MD      . aspirin tablet 325 mg  325 mg Oral Daily Alice Reichert, MD   325 mg at 06/09/12 0942  . clopidogrel (PLAVIX) tablet 75 mg  75 mg Oral Q breakfast Beryle Beams, MD   75 mg at 06/09/12 0942  . enoxaparin (LOVENOX) injection 40 mg  40 mg Subcutaneous Q24H Alice Reichert, MD   40 mg at 06/09/12 1952  . isosorbide-hydrALAZINE (BIDIL) 20-37.5 MG per tablet 1 tablet  1 tablet Oral TID Alice Reichert, MD   1 tablet at 06/09/12 2131  . nitroGLYCERIN (NITROSTAT) SL tablet 0.4 mg  0.4 mg Sublingual Q5 min PRN Angus Edilia Bo, MD      . simvastatin (ZOCOR) tablet 40 mg  40 mg Oral q1800 Alice Reichert, MD   40 mg at 06/09/12 1730  . sodium chloride 0.9 % injection 3 mL  3 mL Intravenous Q12H Avon Gully, MD   3 mL at 06/09/12 2132  . sodium chloride 0.9 % injection 3 mL  3 mL Intravenous PRN Avon Gully, MD      . traMADol (ULTRAM) tablet 50 mg  50 mg Oral Q6H PRN Alice Reichert, MD        Past Medical History  Diagnosis Date  . Arteriosclerotic cardiovascular disease (ASCVD) 1998    Inferior MI->PCI of the RCA in 1998; acute anterior MI in 06/2002->  PCI of RCA and LAD with DES x2 to RCA, residual 80% ostial D1, and 80% mid CX and EF-40%; CABG-2011, LIMA-LAD, SVG to D1, OM1 & OM2; EF of 35-40% in 07/2010  . Substance abuse     formerly cocaine  . Hyperlipidemia   . Pulmonary embolism 03/2010  . Chronic anticoagulation   . Keloid     median sternotomy    Past Surgical History  Procedure Laterality Date  . Ptca  06/1996    LAD & RCA  . Coronary artery bypass graft  03/18/2010    LIMA-LAD, SVG to diagonal, OM1 & OM2    ROS: PHYSICAL EXAM BP 121/70  Pulse 80  Temp(Src) 97.6 F (36.4 C) (Oral)  Resp 18  Ht  5\' 6"  (1.676 m)  Wt 215 lb 6.2 oz (97.7 kg)  BMI 34.78 kg/m2  SpO2 100%  EKG:  ASSESSMENT AND PLAN

## 2012-06-10 NOTE — Progress Notes (Signed)
Patient had an 11 beat run of v-tach at 20:35. Physician paged at 20:44. Orders obtained and followed.

## 2012-06-11 MED ORDER — CLOPIDOGREL BISULFATE 75 MG PO TABS: 75.0000 mg | ORAL_TABLET | Freq: Every day | ORAL | Status: DC

## 2012-06-11 NOTE — Progress Notes (Signed)
Saline lock removed. Discharge instructions given. Prescriptions given. Stroke education provided. Pt verbalized understanding of instructions. Left floor ambulatory with family members and nursing staff.

## 2012-06-11 NOTE — Progress Notes (Signed)
UR Chart Review Completed  

## 2012-06-11 NOTE — Discharge Summary (Signed)
Physician Discharge Summary  Patient ID: [name] MRN: [id number] DOB/AGE: [birth year] 67 y.o. Primary Care Physician:Knolan Simien, MD Admit date: 06/06/2012 Discharge date: 06/11/2012    Discharge Diagnoses:    Active Problems:   HYPERLIPIDEMIA   HYPERTENSION   Arteriosclerotic cardiovascular disease (ASCVD)   Pulmonary embolism   CVA (cerebral infarction)   Secondary cardiomyopathy, unspecified     Medication List    TAKE these medications       aspirin 81 MG tablet  Take 81 mg by mouth daily.     clopidogrel 75 MG tablet  Commonly known as:  PLAVIX  Take 1 tablet (75 mg total) by mouth daily with breakfast.     isosorbide-hydrALAZINE 20-37.5 MG per tablet  Commonly known as:  BIDIL  Take 1 tablet by mouth 3 (three) times daily.     metoprolol succinate 50 MG 24 hr tablet  Commonly known as:  TOPROL-XL  TAKE 1.5 TABLETS (75 MG TOTAL) BY MOUTH DAILY.     nitroGLYCERIN 0.4 MG SL tablet  Commonly known as:  NITROSTAT  Place 0.4 mg under the tongue. Take 1 tablet under tongue at onset of chest pain; you may repeat every 5 minutes for up to 3 doses.     simvastatin 40 MG tablet  Commonly known as:  ZOCOR  TAKE 1 TABLET BY MOUTH EVERY EVENING     traMADol 50 MG tablet  Commonly known as:  ULTRAM  Take 50 mg by mouth every 6 (six) hours as needed. For pain.  Patient takes every morning but does not take every evening        Discharged Condition: improved    Consults: Neurology / cardiology  Significant Diagnostic Studies: Ct Head Wo Contrast  06/06/2012  *RADIOLOGY REPORT*  Clinical Data: Left-sided weakness  CT HEAD WITHOUT CONTRAST  Technique:  Contiguous axial images were obtained from the base of the skull through the vertex without contrast.  Comparison: None.  Findings: No evidence of parenchymal hemorrhage or extra-axial fluid collection. No mass lesion, mass effect, or midline shift.  No CT evidence of acute infarction.  Subcortical white matter  and periventricular small vessel ischemic changes, most prominent in the subcortical left frontal lobe (series 2/image 15).  Intracranial atherosclerosis.  The visualized paranasal sinuses are essentially clear. The mastoid air cells are unopacified.  No evidence of calvarial fracture.  IMPRESSION: No evidence of acute intracranial abnormality.  Small vessel ischemic changes, as described above.   Original Report Authenticated By: Charline Bills, M.D.    Mr [facility] Wo Contrast  06/07/2012  *RADIOLOGY REPORT*  Clinical Data: Left sided headache and facial numbness.  Left-sided visual loss.  MRA HEAD WITHOUT CONTRAST  Technique: Angiographic images of the Circle of Willis were obtained using MRA technique without intravenous contrast.  Comparison: MRI brain performed concurrently.  Findings: Asymmetric signal drop out at the junction of the distal cervical internal carotid artery on the right (C2 segment) not noted on the left.  This is not clearly explained by turbulence, and a skull base RICA stenosis is not excluded.  Of note, the the caliber of the right internal carotid artery distal to this area appears normal, suggesting may be artifactual. Otherwise normal appearing petrous, cavernous, and supraclinoid right internal carotid artery.  Normal cervical, petrous, cavernous, and supraclinoid left internal carotid artery.  Normal basilar artery with vertebrals codominant.  Unremarkable anterior, middle, and posterior cerebral arteries.  No cerebellar branch occlusion.  Mild irregularity of the distal MCA and PCA  branches suggests intracranial atherosclerotic change.  No visible intracranial aneurysm.  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 Otherwise unremarkable MRA intracranial circulation.   Original Report Authenticated By: Davonna Belling, M.D.    [name] Wo Contrast  06/07/2012  *RADIOLOGY REPORT*  Clinical Data: History of hypertension.  History of former cocaine abuse.  Acute onset of left facial dysesthesia, also blurred vision with flashing lights.  MRI HEAD WITHOUT AND WITH CONTRAST  Technique:  Multiplanar, multiecho pulse sequences of the brain and surrounding structures were obtained according to standard protocol without and with intravenous contrast  Contrast: 20mL MULTIHANCE GADOBENATE DIMEGLUMINE 529 MG/ML IV SOLN  Comparison: CT head 06/06/2012.  MRA head performed concurrently.  Findings: Two separate areas of restricted diffusion involve the right occipital cortex, as well as the medial periventricular white matter adjacent to the right lateral occipital horn, consistent with acute infarction.  No other areas of restricted diffusion are seen. These would fall within the right PCA territory distribution.  There is no hemorrhage, mass lesion, or hydrocephalus.  There are no foci of chronic hemorrhage.  There is mild cerebral and cerebellar atrophy.  There is mild to moderate chronic microvascular ischemic change.  There is a remote cortical and subcortical infarct in the left frontal region. There is a remote right inferior cerebellar infarct. There is a remote left occipital calcarine cortex infarct.  There is also a remote right posterior temporo-occipital cortical infarct.  A remote left thalamic lacunar infarct is seen.  The pituitary and cerebellar tonsils are unremarkable.  There are no worrisome osseous findings.  Coronal images demonstrate normal temporal lobes bilaterally.  Following the administration of contrast, there is no abnormal intracranial enhancement.  There is no acute sinus or mastoid disease.  Negative orbits.  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  No hemorrhage.  Chronic areas of infarction as described.  Mild atrophy with moderately extensive chronic microvascular ischemic change.  No areas of abnormal post contrast enhancement.   Original Report Authenticated By: Davonna Belling, M.D.    US Carotid Duplex  Bilateral  06/07/2012  *RADIOLOGY REPORT*  Clinical Data: Small right PCA territory infarct  BILATERAL CAROTID DUPLEX ULTRASOUND  Technique: Wallace Cullens scale imaging, color Doppler and duplex ultrasound was performed of bilateral carotid and vertebral arteries in the neck.  Comparison:  06/07/2012 MRI  Criteria:  Quantification of carotid stenosis is based on velocity parameters that correlate the residual internal carotid diameter with NASCET-based stenosis levels, using the diameter of the distal internal carotid lumen as the denominator for stenosis measurement.  The following velocity measurements were obtained:                   PEAK SYSTOLIC/END DIASTOLIC RIGHT ICA:                        81/18cm/sec CCA:                        115/20cm/sec SYSTOLIC ICA/CCA RATIO:     0.70 DIASTOLIC ICA/CCA RATIO:    0.92 ECA:                        214cm/sec  LEFT ICA:                        58/22cm/sec CCA:  124/16cm/sec SYSTOLIC ICA/CCA RATIO:     0.47 DIASTOLIC ICA/CCA RATIO:    1.41 ECA:                        83cm/sec  Findings:  RIGHT CAROTID ARTERY: Diffuse intimal thickening and mild atherosclerotic changes.  No hemodynamically significant right ICA stenosis, velocity elevation, or turbulent flow.  RIGHT VERTEBRAL ARTERY:  Antegrade  LEFT CAROTID ARTERY: Diffuse intimal thickening and mild atherosclerosis as well.  No significant left ICA stenosis, velocity elevation, or turbulent flow.  LEFT VERTEBRAL ARTERY:  Antegrade  IMPRESSION: Mild carotid intimal thickening and atherosclerotic changes.  No hemodynamically significant ICA stenosis by ultrasound.  Degree of narrowing less than 50% bilaterally.   Original Report Authenticated By: Judie Petit. Miles Costain, M.D.     Lab Results: Basic Metabolic Panel:  Recent Labs  16/10/96 2057  NA 136  K 3.8  CL 99  CO2 28  GLUCOSE 97  BUN 10  CREATININE 1.27  CALCIUM 9.2  MG 1.9   Liver Function Tests: No results found for this basename: AST, ALT, ALKPHOS,  BILITOT, PROT, ALBUMIN,  in the last 72 hours   CBC: No results found for this basename: WBC, NEUTROABS, HGB, HCT, MCV, PLT,  in the last 72 hours  No results found for this or any previous visit (from the past 240 hour(s)).   Hospital Course:  This is a 66 years old patient with history of multiple medical illnesses who was admitted due lt eye blurred vision and numbness and weakness of the lt side. His MRI showed occipital and cerebral stroke. Patient was seen by neurology and cardiiology. ECHO and TEE was done. No significant finding on TEE. He improved and will discharged on combination of plavis and ASA  Discharge Exam: Blood pressure 106/62, pulse 81, temperature 97.4 F (36.3 C), temperature source Oral, resp. rate 20, height 5\' 6"  (1.676 m), weight 97.523 kg (215 lb), SpO2 96.00%.    Disposition:  Home        Future Appointments Provider Department Dept Phone   07/14/2012 1:30 PM Kathlen Brunswick, MD [facility] [place] [phone]        Signed: [facility]   06/11/2012, 3:25 PM

## 2012-06-14 ENCOUNTER — Encounter (HOSPITAL_COMMUNITY): Payer: Self-pay | Admitting: Cardiovascular Disease

## 2012-07-14 ENCOUNTER — Telehealth: Payer: Self-pay | Admitting: *Deleted

## 2012-07-14 ENCOUNTER — Encounter: Payer: Self-pay | Admitting: Cardiology

## 2012-07-14 ENCOUNTER — Ambulatory Visit (INDEPENDENT_AMBULATORY_CARE_PROVIDER_SITE_OTHER): Payer: Medicare Other | Admitting: Cardiology

## 2012-07-14 VITALS — BP 122/68 | HR 78 | Ht 66.0 in | Wt 221.0 lb

## 2012-07-14 DIAGNOSIS — I7090 Unspecified atherosclerosis: Secondary | ICD-10-CM

## 2012-07-14 DIAGNOSIS — I639 Cerebral infarction, unspecified: Secondary | ICD-10-CM

## 2012-07-14 DIAGNOSIS — I6350 Cerebral infarction due to unspecified occlusion or stenosis of unspecified cerebral artery: Secondary | ICD-10-CM

## 2012-07-14 DIAGNOSIS — I2699 Other pulmonary embolism without acute cor pulmonale: Secondary | ICD-10-CM

## 2012-07-14 DIAGNOSIS — I10 Essential (primary) hypertension: Secondary | ICD-10-CM

## 2012-07-14 DIAGNOSIS — I2510 Atherosclerotic heart disease of native coronary artery without angina pectoris: Secondary | ICD-10-CM

## 2012-07-14 DIAGNOSIS — E785 Hyperlipidemia, unspecified: Secondary | ICD-10-CM

## 2012-07-14 NOTE — Progress Notes (Signed)
Patient ID: [name], male   DOB: 08/09/1945, 67 y.o.   MRN: [id number]  HPI: Schedule return visit for very pleasant gentleman with a history of pulmonary embolism. He was recently admitted to hospital with neurologic symptoms and found to have evidence for remote CVA on MRI/MRA without significant vascular obstruction. Transesophageal echocardiogram revealed improved left ventricular systolic function and no apparent source of embolism. He is currently treated with clopidogrel and aspirin for presumed cerebrovascular disease.  Current Outpatient Prescriptions  Medication Sig Dispense Refill  . aspirin 81 MG tablet Take 81 mg by mouth daily.       . clopidogrel (PLAVIX) 75 MG tablet Take 1 tablet (75 mg total) by mouth daily with breakfast.  30 tablet  3  . isosorbide-hydrALAZINE (BIDIL) 20-37.5 MG per tablet Take 1 tablet by mouth 3 (three) times daily.  90 tablet  6  . metoprolol succinate (TOPROL-XL) 50 MG 24 hr tablet TAKE 1.5 TABLETS (75 MG TOTAL) BY MOUTH DAILY.  45 tablet  5  . nitroGLYCERIN (NITROSTAT) 0.4 MG SL tablet Place 0.4 mg under the tongue. Take 1 tablet under tongue at onset of chest pain; you may repeat every 5 minutes for up to 3 doses.      . simvastatin (ZOCOR) 40 MG tablet TAKE 1 TABLET BY MOUTH EVERY EVENING  30 tablet  6  . traMADol (ULTRAM) 50 MG tablet Take 50 mg by mouth every 6 (six) hours as needed. For pain. Patient takes every morning but does not take every evening       No current facility-administered medications for this visit.   Allergies  Allergen Reactions  . Lisinopril     Mouth and tongue swells     Past medical history, social history, and family history reviewed and updated.  ROS: Denies chest pain, dyspnea, leg pain, peripheral edema. All other systems reviewed and are negative.  PHYSICAL EXAM: BP 122/68  Pulse 78  Ht 5\' 6"  (1.676 m)  Wt 100.245 kg (221 lb)  BMI 35.69 kg/m2;  Body mass index is 35.69 kg/(m^2). General-Well developed;  no acute distress Body habitus-mildly to moderately overweight Neck-No JVD; no carotid bruits Lungs-clear lung fields; resonant to percussion Cardiovascular-normal PMI; normal S1 and S2; modest systolic murmur Abdomen-normal bowel sounds; soft and non-tender without masses or organomegaly Musculoskeletal-No deformities, no cyanosis or clubbing Neurologic-Normal cranial nerves; symmetric strength and tone Skin-Warm, no significant lesions Extremities-distal pulses intact; no edema  D-dimer-0.69 in 02/2012  [place] Bing, MD 07/14/2012  2:17 PM  ASSESSMENT AND PLAN

## 2012-07-14 NOTE — Assessment & Plan Note (Signed)
Mechanism of CVA uncertain. Patient currently doing well on dual antiplatelet therapy.

## 2012-07-14 NOTE — Telephone Encounter (Signed)
FYI:Pt called in to advise his new RX for Bidil has not been called in to his pharmacy, reviewed pt written instructions per MD RR and noted pt is to STOP BIDIL after he completes his current RX, pt is to follow up in 3 months however no apt noted at this time and pt was unable to confirm availability during this phone call , confirmed orders with MD RR verbally and was confirmed pt is to STOP BIDIL and no RX was to be called in, pt understood, pt requested call back to make a follow up apt tomorrow

## 2012-07-14 NOTE — Assessment & Plan Note (Signed)
Very low total and LDL cholesterol. Current therapy will be continued.

## 2012-07-14 NOTE — Assessment & Plan Note (Signed)
Blood pressure control has been excellent. Patient advised to monitor at local pharmacies.  Discontinuation of Bidil may result in some increase, but this will probably not be significant.

## 2012-07-14 NOTE — Progress Notes (Deleted)
Name: [name]    DOB: March 31, 1946  Age: 67 y.o.  MR#: [id number]       PCP:  Avon Gully, MD      Insurance: Payor: MEDICARE  Plan: MEDICARE PART B  Product Type: *No Product type*    CC:   No chief complaint on file.  Medication list TEE 06/10/12 CUS 06/07/12 2D Echo 06/09/12 EEG 06/09/12  VS Filed Vitals:   07/14/12 1328  BP: 122/68  Pulse: 78  Height: 5\' 6"  (1.676 m)  Weight: 221 lb (100.245 kg)    Weights Current Weight  07/14/12 221 lb (100.245 kg)  06/10/12 215 lb (97.523 kg)  06/10/12 215 lb (97.523 kg)    Blood Pressure  BP Readings from Last 3 Encounters:  07/14/12 122/68  06/11/12 106/62  06/11/12 106/62     Admit date:  (Not on file) Last encounter with RMR:  04/01/2012   Allergy Lisinopril  Current Outpatient Prescriptions  Medication Sig Dispense Refill  . aspirin 81 MG tablet Take 81 mg by mouth daily.       . clopidogrel (PLAVIX) 75 MG tablet Take 1 tablet (75 mg total) by mouth daily with breakfast.  30 tablet  3  . isosorbide-hydrALAZINE (BIDIL) 20-37.5 MG per tablet Take 1 tablet by mouth 3 (three) times daily.  90 tablet  6  . metoprolol succinate (TOPROL-XL) 50 MG 24 hr tablet TAKE 1.5 TABLETS (75 MG TOTAL) BY MOUTH DAILY.  45 tablet  5  . nitroGLYCERIN (NITROSTAT) 0.4 MG SL tablet Place 0.4 mg under the tongue. Take 1 tablet under tongue at onset of chest pain; you may repeat every 5 minutes for up to 3 doses.      . simvastatin (ZOCOR) 40 MG tablet TAKE 1 TABLET BY MOUTH EVERY EVENING  30 tablet  6  . traMADol (ULTRAM) 50 MG tablet Take 50 mg by mouth every 6 (six) hours as needed. For pain. Patient takes every morning but does not take every evening       No current facility-administered medications for this visit.    Discontinued Meds:   There are no discontinued medications.  Patient Active Problem List  Diagnosis  . HYPERLIPIDEMIA  . HYPERTENSION  . Arteriosclerotic cardiovascular disease (ASCVD)  . Pulmonary embolism  . CVA  (cerebral infarction)  . Secondary cardiomyopathy, unspecified    LABS    Component Value Date/Time   NA 136 06/10/2012 2057   NA 133* 06/06/2012 1150   NA 138 03/22/2012 0105   K 3.8 06/10/2012 2057   K 4.1 06/06/2012 1150   K 3.5 03/22/2012 0105   CL 99 06/10/2012 2057   CL 100 06/06/2012 1150   CL 103 03/22/2012 0105   CO2 28 06/10/2012 2057   CO2 26 06/06/2012 1150   CO2 25 03/22/2012 0105   GLUCOSE 97 06/10/2012 2057   GLUCOSE 98 06/06/2012 1150   GLUCOSE 110* 03/22/2012 0105   BUN 10 06/10/2012 2057   BUN 10 06/06/2012 1150   BUN 13 03/22/2012 0105   CREATININE 1.27 06/10/2012 2057   CREATININE 1.24 06/06/2012 1150   CREATININE 1.29 03/22/2012 0105   CALCIUM 9.2 06/10/2012 2057   CALCIUM 9.3 06/06/2012 1150   CALCIUM 9.3 03/22/2012 0105   GFRNONAA 58* 06/10/2012 2057   GFRNONAA 59* 06/06/2012 1150   GFRNONAA 57* 03/22/2012 0105   GFRAA 67* 06/10/2012 2057   GFRAA 69* 06/06/2012 1150   GFRAA 66* 03/22/2012 0105   CMP     Component Value  Date/Time   NA 136 06/10/2012 2057   K 3.8 06/10/2012 2057   CL 99 06/10/2012 2057   CO2 28 06/10/2012 2057   GLUCOSE 97 06/10/2012 2057   BUN 10 06/10/2012 2057   CREATININE 1.27 06/10/2012 2057   CALCIUM 9.2 06/10/2012 2057   PROT 7.5 06/06/2012 1150   ALBUMIN 4.0 06/06/2012 1150   AST 21 06/06/2012 1150   ALT 24 06/06/2012 1150   ALKPHOS 53 06/06/2012 1150   BILITOT 1.3* 06/06/2012 1150   GFRNONAA 58* 06/10/2012 2057   GFRAA 67* 06/10/2012 2057       Component Value Date/Time   WBC 5.0 06/06/2012 1150   WBC 6.0 03/22/2012 0105   WBC 5.9 07/27/2010 0927   HGB 16.0 06/06/2012 1150   HGB 16.3 03/22/2012 0105   HGB 14.2 07/27/2010 0927   HCT 47.8 06/06/2012 1150   HCT 48.0 03/22/2012 0105   HCT 42.7 07/27/2010 0927   MCV 88.0 06/06/2012 1150   MCV 87.8 03/22/2012 0105   MCV 84.2 07/27/2010 0927    Lipid Panel     Component Value Date/Time   CHOL  Value: 95        ATP III CLASSIFICATION:  <200     mg/dL   Desirable  161-096  mg/dL   Borderline High  >=045    mg/dL   High        07/14/8117  0630   TRIG 101 07/27/2010 0630   HDL 35* 07/27/2010 0630   CHOLHDL 2.7 07/27/2010 0630   VLDL 20 07/27/2010 0630   LDLCALC  Value: 40        Total Cholesterol/HDL:CHD Risk Coronary Heart Disease Risk Table                     Men   Women  1/2 Average Risk   3.4   3.3  Average Risk       5.0   4.4  2 X Average Risk   9.6   7.1  3 X Average Risk  23.4   11.0        Use the calculated Patient Ratio above and the CHD Risk Table to determine the patient's CHD Risk.        ATP III CLASSIFICATION (LDL):  <100     mg/dL   Optimal  147-829  mg/dL   Near or Above                    Optimal  130-159  mg/dL   Borderline  562-130  mg/dL   High  >865     mg/dL   Very High 7/84/6962 9528    ABG    Component Value Date/Time   PHART 7.443 03/30/2010 1113   PCO2ART 30.6* 03/30/2010 1113   PO2ART 62.1* 03/30/2010 1113   HCO3 20.6 03/30/2010 1113   TCO2 18.5 03/30/2010 1113   ACIDBASEDEF 2.8* 03/30/2010 1113   O2SAT 90.1 03/30/2010 1113     Lab Results  Component Value Date   TSH 1.043 06/07/2012   BNP (last 3 results) No results found for this basename: PROBNP,  in the last 8760 hours Cardiac Panel (last 3 results) No results found for this basename: CKTOTAL, CKMB, TROPONINI, RELINDX,  in the last 72 hours  Iron/TIBC/Ferritin No results found for this basename: iron, tibc, ferritin     EKG Orders placed during the hospital encounter of 06/06/12  . EKG 12-LEAD  . EKG 12-LEAD  . EKG  Prior Assessment and Plan Problem List as of 07/14/2012     ICD-9-CM     Cardiology Problems   HYPERLIPIDEMIA   Last Assessment & Plan   01/14/2012 Office Visit Written 01/16/2012 10:37 AM by Kathlen Brunswick, MD     Extremely low lipid values in 2012.  Repeat profile will be obtained at patient's next visit.    HYPERTENSION   Last Assessment & Plan   04/01/2012 Office Visit Written 04/01/2012  4:55 PM by Jodelle Gross, NP     Excellent control of BP on this visit. No changes in medications     Arteriosclerotic cardiovascular disease (ASCVD)   Last Assessment & Plan   04/01/2012 Office Visit Written 04/01/2012  4:54 PM by Jodelle Gross, NP     Chest pain is atypical and associated with eating rich foods. May be GERD symptoms vs gallbladder disease. He is not positive for Murphy';s sign on exam. He had diarrhea after coming home from ER and has not had recurrence of symptoms.  No cardiac testing will be planned at this time unless symptoms persist.     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 Chest discomfort which lead him to ER is not likely related to PE.    Secondary cardiomyopathy, unspecified     Other   CVA (cerebral infarction)       Imaging: No results found.

## 2012-07-14 NOTE — Assessment & Plan Note (Addendum)
No symptoms to suggest active myocardial ischemia. Improved ejection fraction by echocardiography.  Cost of Bidil represents a significant financial problem for the patient. Benefit of treatment is speculative with fairly good left ventricular systolic function-> discontinue. Patient has suffered possible serious allergic reaction to ACE inhibitor, but could be treated with ARB if necessary. I will reassess his symptoms in 3 months and decide at that time.

## 2012-07-14 NOTE — Assessment & Plan Note (Addendum)
No evidence for recurrent DVT/pulmonary embolism. We will continue to manage with full anticoagulation.

## 2012-07-15 NOTE — Telephone Encounter (Signed)
Follow up appointment made for April 29 th

## 2012-07-26 ENCOUNTER — Encounter: Payer: Self-pay | Admitting: Neurology

## 2012-07-29 ENCOUNTER — Encounter: Payer: Medicare Other | Admitting: *Deleted

## 2012-07-29 ENCOUNTER — Other Ambulatory Visit: Payer: Self-pay | Admitting: *Deleted

## 2012-07-29 DIAGNOSIS — I4891 Unspecified atrial fibrillation: Secondary | ICD-10-CM

## 2012-07-30 ENCOUNTER — Other Ambulatory Visit (HOSPITAL_COMMUNITY): Payer: Self-pay

## 2012-07-30 DIAGNOSIS — G4710 Hypersomnia, unspecified: Secondary | ICD-10-CM

## 2012-07-30 DIAGNOSIS — G4730 Sleep apnea, unspecified: Secondary | ICD-10-CM

## 2012-08-03 ENCOUNTER — Encounter (INDEPENDENT_AMBULATORY_CARE_PROVIDER_SITE_OTHER): Payer: Medicare Other | Admitting: Cardiology

## 2012-08-03 ENCOUNTER — Encounter: Payer: Self-pay | Admitting: Cardiology

## 2012-08-03 VITALS — BP 135/80 | HR 52 | Ht 66.0 in | Wt 220.0 lb

## 2012-08-03 DIAGNOSIS — I10 Essential (primary) hypertension: Secondary | ICD-10-CM

## 2012-08-03 DIAGNOSIS — R0989 Other specified symptoms and signs involving the circulatory and respiratory systems: Secondary | ICD-10-CM

## 2012-08-03 NOTE — Progress Notes (Deleted)
Name: [name]    DOB: Jan 10, 1946  Age: 67 y.o.  MR#: [id number]       PCP:  Avon Gully, MD      Insurance: Payor: MEDICARE  Plan: MEDICARE PART B  Product Type: *No Product type*    CC:   No chief complaint on file.  LIST VS Filed Vitals:   08/03/12 1432  BP: 135/80  Pulse: 52  Height: 5\' 6"  (1.676 m)  Weight: 220 lb (99.791 kg)    Weights Current Weight  08/03/12 220 lb (99.791 kg)  07/14/12 221 lb (100.245 kg)  06/10/12 215 lb (97.523 kg)    Blood Pressure  BP Readings from Last 3 Encounters:  08/03/12 135/80  07/14/12 122/68  06/11/12 106/62     Admit date:  (Not on file) Last encounter with RMR:  07/14/2012   Allergy Lisinopril  Current Outpatient Prescriptions  Medication Sig Dispense Refill  . aspirin 81 MG tablet Take 81 mg by mouth daily.       . clopidogrel (PLAVIX) 75 MG tablet Take 1 tablet (75 mg total) by mouth daily with breakfast.  30 tablet  3  . metoprolol succinate (TOPROL-XL) 50 MG 24 hr tablet TAKE 1.5 TABLETS (75 MG TOTAL) BY MOUTH DAILY.  45 tablet  5  . simvastatin (ZOCOR) 40 MG tablet TAKE 1 TABLET BY MOUTH EVERY EVENING  30 tablet  6  . traMADol (ULTRAM) 50 MG tablet Take 50 mg by mouth every 6 (six) hours as needed. For pain. Patient takes every morning but does not take every evening      . nitroGLYCERIN (NITROSTAT) 0.4 MG SL tablet Place 0.4 mg under the tongue. Take 1 tablet under tongue at onset of chest pain; you may repeat every 5 minutes for up to 3 doses.       No current facility-administered medications for this visit.    Discontinued Meds:    Medications Discontinued During This Encounter  Medication Reason  . isosorbide-hydrALAZINE (BIDIL) 20-37.5 MG per tablet Error    Patient Active Problem List   Diagnosis Date Noted  . CVA (cerebral infarction) 06/09/2012  . Arteriosclerotic cardiovascular disease (ASCVD)   . Pulmonary embolism 03/07/2010  . HYPERLIPIDEMIA 11/24/2008  . HYPERTENSION 11/24/2008    LABS     Component Value Date/Time   NA 136 06/10/2012 2057   NA 133* 06/06/2012 1150   NA 138 03/22/2012 0105   K 3.8 06/10/2012 2057   K 4.1 06/06/2012 1150   K 3.5 03/22/2012 0105   CL 99 06/10/2012 2057   CL 100 06/06/2012 1150   CL 103 03/22/2012 0105   CO2 28 06/10/2012 2057   CO2 26 06/06/2012 1150   CO2 25 03/22/2012 0105   GLUCOSE 97 06/10/2012 2057   GLUCOSE 98 06/06/2012 1150   GLUCOSE 110* 03/22/2012 0105   BUN 10 06/10/2012 2057   BUN 10 06/06/2012 1150   BUN 13 03/22/2012 0105   CREATININE 1.27 06/10/2012 2057   CREATININE 1.24 06/06/2012 1150   CREATININE 1.29 03/22/2012 0105   CALCIUM 9.2 06/10/2012 2057   CALCIUM 9.3 06/06/2012 1150   CALCIUM 9.3 03/22/2012 0105   GFRNONAA 58* 06/10/2012 2057   GFRNONAA 59* 06/06/2012 1150   GFRNONAA 57* 03/22/2012 0105   GFRAA 67* 06/10/2012 2057   GFRAA 69* 06/06/2012 1150   GFRAA 66* 03/22/2012 0105   CMP     Component Value Date/Time   NA 136 06/10/2012 2057   K 3.8 06/10/2012 2057  CL 99 06/10/2012 2057   CO2 28 06/10/2012 2057   GLUCOSE 97 06/10/2012 2057   BUN 10 06/10/2012 2057   CREATININE 1.27 06/10/2012 2057   CALCIUM 9.2 06/10/2012 2057   PROT 7.5 06/06/2012 1150   ALBUMIN 4.0 06/06/2012 1150   AST 21 06/06/2012 1150   ALT 24 06/06/2012 1150   ALKPHOS 53 06/06/2012 1150   BILITOT 1.3* 06/06/2012 1150   GFRNONAA 58* 06/10/2012 2057   GFRAA 67* 06/10/2012 2057       Component Value Date/Time   WBC 5.0 06/06/2012 1150   WBC 6.0 03/22/2012 0105   WBC 5.9 07/27/2010 0927   HGB 16.0 06/06/2012 1150   HGB 16.3 03/22/2012 0105   HGB 14.2 07/27/2010 0927   HCT 47.8 06/06/2012 1150   HCT 48.0 03/22/2012 0105   HCT 42.7 07/27/2010 0927   MCV 88.0 06/06/2012 1150   MCV 87.8 03/22/2012 0105   MCV 84.2 07/27/2010 0927    Lipid Panel     Component Value Date/Time   CHOL  Value: 95        ATP III CLASSIFICATION:  <200     mg/dL   Desirable  161-096  mg/dL   Borderline High  >=045    mg/dL   High        07/14/8117 0630   TRIG 101 07/27/2010 0630   HDL 35* 07/27/2010 0630   CHOLHDL 2.7  07/27/2010 0630   VLDL 20 07/27/2010 0630   LDLCALC  Value: 40        Total Cholesterol/HDL:CHD Risk Coronary Heart Disease Risk Table                     Men   Women  1/2 Average Risk   3.4   3.3  Average Risk       5.0   4.4  2 X Average Risk   9.6   7.1  3 X Average Risk  23.4   11.0        Use the calculated Patient Ratio above and the CHD Risk Table to determine the patient's CHD Risk.        ATP III CLASSIFICATION (LDL):  <100     mg/dL   Optimal  147-829  mg/dL   Near or Above                    Optimal  130-159  mg/dL   Borderline  562-130  mg/dL   High  >865     mg/dL   Very High 7/84/6962 9528    ABG    Component Value Date/Time   PHART 7.443 03/30/2010 1113   PCO2ART 30.6* 03/30/2010 1113   PO2ART 62.1* 03/30/2010 1113   HCO3 20.6 03/30/2010 1113   TCO2 18.5 03/30/2010 1113   ACIDBASEDEF 2.8* 03/30/2010 1113   O2SAT 90.1 03/30/2010 1113     Lab Results  Component Value Date   TSH 1.043 06/07/2012   BNP (last 3 results) No results found for this basename: PROBNP,  in the last 8760 hours Cardiac Panel (last 3 results) No results found for this basename: CKTOTAL, CKMB, TROPONINI, RELINDX,  in the last 72 hours  Iron/TIBC/Ferritin No results found for this basename: iron, tibc, ferritin     EKG Orders placed in visit on 07/29/12  . CARDIAC EVENT MONITOR     Prior Assessment and Plan Problem List as of 08/03/2012     ICD-9-CM   HYPERLIPIDEMIA   Last  Assessment & Plan   07/14/2012 Office Visit Written 07/14/2012  2:29 PM by Kathlen Brunswick, MD     Very low total and LDL cholesterol. Current therapy will be continued.    HYPERTENSION   Last Assessment & Plan   07/14/2012 Office Visit Written 07/14/2012  2:28 PM by Kathlen Brunswick, MD     Blood pressure control has been excellent. Patient advised to monitor at local pharmacies.  Discontinuation of Bidil may result in some increase, but this will probably not be significant.    Arteriosclerotic cardiovascular disease (ASCVD)    Last Assessment & Plan   07/14/2012 Office Visit Edited 07/24/2012  5:30 PM by Kathlen Brunswick, MD     No symptoms to suggest active myocardial ischemia. Improved ejection fraction by echocardiography.  Cost of Bidil represents a significant financial problem for the patient. Benefit of treatment is speculative with fairly good left ventricular systolic function-> discontinue. Patient has suffered possible serious allergic reaction to ACE inhibitor, but could be treated with ARB if necessary. I will reassess his symptoms in 3 months and decide at that time.    Pulmonary embolism   Last Assessment & Plan   07/14/2012 Office Visit Edited 07/24/2012  5:31 PM by Kathlen Brunswick, MD     No evidence for recurrent DVT/pulmonary embolism. We will continue to manage with full anticoagulation.    CVA (cerebral infarction)   Last Assessment & Plan   07/14/2012 Office Visit Written 07/14/2012  2:26 PM by Kathlen Brunswick, MD     Mechanism of CVA uncertain. Patient currently doing well on dual antiplatelet therapy.        Imaging: No results found.

## 2012-08-03 NOTE — Patient Instructions (Addendum)
Your physician recommends that you schedule a follow-up appointment in:  

## 2012-08-03 NOTE — Progress Notes (Deleted)
Patient ID: [name], male   DOB: 02-07-1946, 67 y.o.   MRN: [id number]  HPI: Schedule return visit for ***  Scheduled Meds: Continuous Infusions: PRN Meds:.   Allergies  Allergen Reactions  . Lisinopril     Mouth and tongue swells     Past medical history, social history, and family history reviewed and updated.  ROS: ***  PHYSICAL EXAM: BP 135/80  Pulse 52  Ht 5\' 6"  (1.676 m)  Wt 99.791 kg (220 lb)  BMI 35.53 kg/m2;  Body mass index is 35.53 kg/(m^2). General-Well developed; no acute distress Body habitus-proportionate weight and height Neck-No JVD; no carotid bruits Lungs-clear lung fields; resonant to percussion Cardiovascular-normal PMI; normal S1 and S2 Abdomen-normal bowel sounds; soft and non-tender without masses or organomegaly Musculoskeletal-No deformities, no cyanosis or clubbing Neurologic-Normal cranial nerves; symmetric strength and tone Skin-Warm, no significant lesions Extremities-distal pulses intact; no edema  EKG: ***  Brian Bing, MD 08/03/2012  3:01 PM  ASSESSMENT AND PLAN

## 2012-08-04 ENCOUNTER — Ambulatory Visit: Payer: PRIVATE HEALTH INSURANCE | Attending: Neurology | Admitting: Sleep Medicine

## 2012-08-04 VITALS — Ht 66.0 in | Wt 220.0 lb

## 2012-08-04 DIAGNOSIS — G4730 Sleep apnea, unspecified: Secondary | ICD-10-CM

## 2012-08-04 DIAGNOSIS — G4710 Hypersomnia, unspecified: Secondary | ICD-10-CM | POA: Insufficient documentation

## 2012-08-04 DIAGNOSIS — Z6836 Body mass index (BMI) 36.0-36.9, adult: Secondary | ICD-10-CM | POA: Insufficient documentation

## 2012-08-04 DIAGNOSIS — G4700 Insomnia, unspecified: Secondary | ICD-10-CM

## 2012-08-10 NOTE — Progress Notes (Signed)
This encounter was created in error - please disregard.

## 2012-08-13 NOTE — Procedures (Signed)
[facility] Diamante Rubin A. [name], MD     [email or web address]        NAMEAYDRIAN, HALPIN               ACCOUNT NO.:  [id number]  MEDICAL RECORD NO.:  [id number]          PATIENT TYPE:  OUT  LOCATION:  SLEEP LAB                     FACILITY:  [facility]  PHYSICIAN:  Alyzza Andringa A. [name], M.D. DATE OF BIRTH:  November 15, 1945  DATE OF STUDY:  08/04/2012                           NOCTURNAL POLYSOMNOGRAM  REFERRING PHYSICIAN:  Alyus Mofield A. [name], M.D.  INDICATIONS:  This 67 year old, who presents with hypersomnia, witnessed apnea, and snoring.  MEDICATIONS:  Aspirin, clopidogrel, simvastatin, metoprolol, tramadol.  EPWORTH SLEEPINESS SCALE:  11.  BMI 36.  ARCHITECTURAL SUMMARY:  The total recording time is 397 minutes.  Sleep efficiency 67%.  Sleep latency 60 minutes.  REM latency 94 minutes. Stage N1 11%, N2 66%, N3 0%, and REM sleep 22%.  RESPIRATORY SUMMARY:  Baseline oxygen saturation is 94%.  Lowest saturation 89% during REM sleep.  Diagnostic AHI is 24 and RDI also 24. More events tended to occur in REM sleep.  The patient's events are almost exclusively central apneas, and the patient had a total of 105 events with 78 of those being central, 1 obstructive, 22 mixed, and 23 hypopneas.  LIMB MOVEMENT SUMMARY:  PLM index 0.  ELECTROCARDIOGRAM SUMMARY:  Average heart rate is 48 with infrequent PVCs observed.  IMPRESSION:  Moderate central sleep apnea syndrome.  RECOMMENDATIONS:  The patient should be set up for Servo-Ventilation titration given that he has central sleep apnea syndrome.   Kyrene Longan A. [name], M.D.    KAD/MEDQ  D:  08/13/2012 18:56:16  T:  08/13/2012 19:32:46  Job:  [id number]

## 2012-08-23 ENCOUNTER — Other Ambulatory Visit: Payer: Self-pay | Admitting: *Deleted

## 2012-08-23 DIAGNOSIS — I4891 Unspecified atrial fibrillation: Secondary | ICD-10-CM

## 2012-08-31 ENCOUNTER — Other Ambulatory Visit: Payer: Self-pay | Admitting: Cardiology

## 2012-10-05 ENCOUNTER — Other Ambulatory Visit: Payer: Self-pay | Admitting: Cardiology

## 2012-10-05 ENCOUNTER — Other Ambulatory Visit: Payer: Self-pay

## 2012-10-05 DIAGNOSIS — G4730 Sleep apnea, unspecified: Secondary | ICD-10-CM

## 2012-10-05 NOTE — Telephone Encounter (Signed)
Medication sent via escribe for metoprolol succinate (TOPROL-XL) 50 MG 24 hr tablet.

## 2012-10-12 ENCOUNTER — Ambulatory Visit: Payer: PRIVATE HEALTH INSURANCE | Attending: Neurology | Admitting: Sleep Medicine

## 2012-10-12 VITALS — Ht 66.0 in | Wt 220.0 lb

## 2012-10-12 DIAGNOSIS — Z6836 Body mass index (BMI) 36.0-36.9, adult: Secondary | ICD-10-CM | POA: Insufficient documentation

## 2012-10-12 DIAGNOSIS — G4730 Sleep apnea, unspecified: Secondary | ICD-10-CM

## 2012-10-12 DIAGNOSIS — G4733 Obstructive sleep apnea (adult) (pediatric): Secondary | ICD-10-CM | POA: Insufficient documentation

## 2012-10-18 NOTE — Procedures (Signed)
[facility] Alister Staver A. [name], MD     [email or web address]        NAMENAITHEN, RIVENBURG               ACCOUNT NO.:  [id number]  MEDICAL RECORD NO.:  [id number]          PATIENT TYPE:  OUT  LOCATION:  SLEEP LAB                     FACILITY:  [facility]  PHYSICIAN:  Amritha Yorke A. [name], M.D. DATE OF BIRTH:  Aug 19, 1945  DATE OF STUDY:                           NOCTURNAL POLYSOMNOGRAM  REFERRING PHYSICIAN:  Jandiel Magallanes A. [name], M.D.  INDICATION:  A 67 year old man, who had a previous study showing marked central sleep apnea syndrome.  MEDICATIONS:  Clopidogrel, simvastatin, metoprolol, and tramadol.  EPWORTH SLEEPINESS SCALE:  11.  BMI:  36.  ARCHITECTURAL SUMMARY:  Total recording time is 356 minutes, sleep efficiency 88%.  Sleep latency 12 minutes.  REM latency 64 minutes. Stage N1 90%, N2 50%, N3 0%, and REM sleep 32%.  RESPIRATORY SUMMARY:  The patient was placed on Servo titration ventilation system and titrated per protocol to the optimal pressures. Optimal pressures; EPAP of 5, maximum pressure support 16, and minimum pressure support 3.  The patient did very well with the setting.  The baseline oxygen saturation is 94 and lowest 92.  LIMB MOVEMENT SUMMARY:  PLM index 0.  ELECTROCARDIOGRAM SUMMARY:  Average heart rate is 50, with no significant dysrhythmias observed.  IMPRESSION:  Central sleep apnea syndrome, which responds well to Servo ventilation titration system with the following pressures:  EPAP 5, maximum pressure support 16, and minimum pressure support 3.     Sephira Zellman A. [name], M.D.    KAD/MEDQ  D:  10/18/2012 10:12:20  T:  10/18/2012 10:46:50  Job:  [id number]

## 2012-10-19 ENCOUNTER — Encounter: Payer: Self-pay | Admitting: Cardiology

## 2012-10-19 ENCOUNTER — Ambulatory Visit (INDEPENDENT_AMBULATORY_CARE_PROVIDER_SITE_OTHER): Payer: PRIVATE HEALTH INSURANCE | Admitting: Cardiology

## 2012-10-19 VITALS — BP 135/75 | HR 61 | Ht 66.0 in | Wt 221.2 lb

## 2012-10-19 DIAGNOSIS — G4731 Primary central sleep apnea: Secondary | ICD-10-CM

## 2012-10-19 DIAGNOSIS — Z7901 Long term (current) use of anticoagulants: Secondary | ICD-10-CM | POA: Insufficient documentation

## 2012-10-19 DIAGNOSIS — I7090 Unspecified atherosclerosis: Secondary | ICD-10-CM

## 2012-10-19 DIAGNOSIS — I10 Essential (primary) hypertension: Secondary | ICD-10-CM

## 2012-10-19 DIAGNOSIS — G4730 Sleep apnea, unspecified: Secondary | ICD-10-CM

## 2012-10-19 DIAGNOSIS — E785 Hyperlipidemia, unspecified: Secondary | ICD-10-CM

## 2012-10-19 DIAGNOSIS — I2510 Atherosclerotic heart disease of native coronary artery without angina pectoris: Secondary | ICD-10-CM

## 2012-10-19 NOTE — Progress Notes (Signed)
Patient ID: [name], male   DOB: [birth year], 67 y.o.   MRN: [id number]  HPI: Scheduled return visit for this very nice gentleman with known atherosclerotic disease, a history of CVA and a history of pulmonary embolism. Since discontinuing chronic anticoagulation 9 months ago, there have been no apparent adverse medical consequences. He's had no symptoms to suggest recurrent myocardial ischemia nor has he experienced new or recurrent neurologic problems.  He has recently been diagnosed with central sleep apnea, but a positive pressure breathing device has not yet been prescribed. He's had some bradycardia, but this has not clearly been symptomatic.  Current Outpatient Prescriptions  Medication Sig Dispense Refill  . clopidogrel (PLAVIX) 75 MG tablet Take 1 tablet (75 mg total) by mouth daily with breakfast.  30 tablet  3  . metoprolol succinate (TOPROL-XL) 50 MG 24 hr tablet TAKE 1 AND 1/2 TABLETS BY MOUTH DAILY.  45 tablet  3  . nitroGLYCERIN (NITROSTAT) 0.4 MG SL tablet Place 0.4 mg under the tongue. Take 1 tablet under tongue at onset of chest pain; you may repeat every 5 minutes for up to 3 doses.      . simvastatin (ZOCOR) 40 MG tablet TAKE 1 TABLET BY MOUTH EVERY EVENING  30 tablet  6  . traMADol (ULTRAM) 50 MG tablet Take 50 mg by mouth every 6 (six) hours as needed. For pain. Patient takes every morning but does not take every evening       No current facility-administered medications for this visit.   Allergies  Allergen Reactions  . Lisinopril     Mouth and tongue swells     Past medical history, social history, and family history reviewed and updated.  ROS:  General: no anorexia, weight gain or weight loss Cardiac: no chest pain, orthopnea, PND,  or syncope; class II dyspnea on exertion Respiratory: no cough, sputum production or hemoptysis GI: no nausea, abdominal pain, emesis, diarrhea or constipation Integument: no significant lesions Neurologic: No muscle weakness or  paralysis; no speech disturbance; no headache   PHYSICAL EXAM: BP 135/75  Pulse 61  Ht 5\' 6"  (1.676 m)  Wt 100.358 kg (221 lb 4 oz)  BMI 35.73 kg/m2;  Body mass index is 35.73 kg/(m^2).   General-Well developed; no acute distress Body habitus-proportionate weight and height Neck-No JVD; no carotid bruits Lungs-clear lung fields; resonant to percussion Cardiovascular-normal PMI; normal S1 and S2 Abdomen-normal bowel sounds; soft and non-tender without masses or organomegaly Musculoskeletal-No deformities, no cyanosis or clubbing Neurologic-Normal cranial nerves; symmetric strength and tone Skin-Warm, no significant lesions Extremities-distal pulses intact; no edema  [name], MD 10/19/2012  2:27 PM  ASSESSMENT AND PLAN

## 2012-10-19 NOTE — Patient Instructions (Addendum)
Your physician recommends that you schedule a follow-up appointment in: 6 MONTHS  Your Physician recommends that you complete the Hemoccult package given to you today, instructions are enclosed in your packet. Once completed please return to this office.   Your physician has requested that you regularly monitor and record your blood pressure readings at home. Please use the same machine at the same time of day to check your readings and record them to bring to your follow-up visit.BRING TO YOUR FOLLOW UP OFFICE VISIT   Your physician recommends THAT YOU SCHEDULE A COLONOSCOPY PER NOTED YOU ARE OVERDUE FOR THIS PROCEDURE, NOTED DR Felecia Shelling SCHEDULED YOU FOR THIS CONSULT LAST YEAR PLEASE FOLLOW UP WITH PCP ON CONCERNS

## 2012-10-19 NOTE — Progress Notes (Deleted)
Name: [name]    DOB: 1945/07/23  Age: 67 y.o.  MR#: [id number]       PCP:  Avon Gully, MD      Insurance: Payor: [employer]vertising [employer] MEDICARE / Plan: Ollen Gross / Product Type: *No Product type* /   CC:   No chief complaint on file. BOTTLES   VS Filed Vitals:   10/19/12 1401  BP: 135/75  Pulse: 61  Height: 5\' 6"  (1.676 m)  Weight: 221 lb 4 oz (100.358 kg)    Weights Current Weight  10/19/12 221 lb 4 oz (100.358 kg)  10/13/12 220 lb (99.791 kg)  08/04/12 220 lb (99.791 kg)    Blood Pressure  BP Readings from Last 3 Encounters:  10/19/12 135/75  08/03/12 135/80  07/14/12 122/68     Admit date:  (Not on file) Last encounter with RMR:  10/05/2012   Allergy Lisinopril  Current Outpatient Prescriptions  Medication Sig Dispense Refill  . clopidogrel (PLAVIX) 75 MG tablet Take 1 tablet (75 mg total) by mouth daily with breakfast.  30 tablet  3  . metoprolol succinate (TOPROL-XL) 50 MG 24 hr tablet TAKE 1 AND 1/2 TABLETS BY MOUTH DAILY.  45 tablet  3  . nitroGLYCERIN (NITROSTAT) 0.4 MG SL tablet Place 0.4 mg under the tongue. Take 1 tablet under tongue at onset of chest pain; you may repeat every 5 minutes for up to 3 doses.      . simvastatin (ZOCOR) 40 MG tablet TAKE 1 TABLET BY MOUTH EVERY EVENING  30 tablet  6  . traMADol (ULTRAM) 50 MG tablet Take 50 mg by mouth every 6 (six) hours as needed. For pain. Patient takes every morning but does not take every evening       No current facility-administered medications for this visit.    Discontinued Meds:    Medications Discontinued During This Encounter  Medication Reason  . aspirin 81 MG tablet Error    Patient Active Problem List   Diagnosis Date Noted  . CVA (cerebral infarction) 06/09/2012  . Arteriosclerotic cardiovascular disease (ASCVD)   . Pulmonary embolism 03/07/2010  . HYPERLIPIDEMIA 11/24/2008  . HYPERTENSION 11/24/2008    LABS    Component Value Date/Time   NA 136 06/10/2012 2057   NA 133*  06/06/2012 1150   NA 138 03/22/2012 0105   K 3.8 06/10/2012 2057   K 4.1 06/06/2012 1150   K 3.5 03/22/2012 0105   CL 99 06/10/2012 2057   CL 100 06/06/2012 1150   CL 103 03/22/2012 0105   CO2 28 06/10/2012 2057   CO2 26 06/06/2012 1150   CO2 25 03/22/2012 0105   GLUCOSE 97 06/10/2012 2057   GLUCOSE 98 06/06/2012 1150   GLUCOSE 110* 03/22/2012 0105   BUN 10 06/10/2012 2057   BUN 10 06/06/2012 1150   BUN 13 03/22/2012 0105   CREATININE 1.27 06/10/2012 2057   CREATININE 1.24 06/06/2012 1150   CREATININE 1.29 03/22/2012 0105   CALCIUM 9.2 06/10/2012 2057   CALCIUM 9.3 06/06/2012 1150   CALCIUM 9.3 03/22/2012 0105   GFRNONAA 58* 06/10/2012 2057   GFRNONAA 59* 06/06/2012 1150   GFRNONAA 57* 03/22/2012 0105   GFRAA 67* 06/10/2012 2057   GFRAA 69* 06/06/2012 1150   GFRAA 66* 03/22/2012 0105   CMP     Component Value Date/Time   NA 136 06/10/2012 2057   K 3.8 06/10/2012 2057   CL 99 06/10/2012 2057   CO2 28 06/10/2012 2057   GLUCOSE 97 06/10/2012  2057   BUN 10 06/10/2012 2057   CREATININE 1.27 06/10/2012 2057   CALCIUM 9.2 06/10/2012 2057   PROT 7.5 06/06/2012 1150   ALBUMIN 4.0 06/06/2012 1150   AST 21 06/06/2012 1150   ALT 24 06/06/2012 1150   ALKPHOS 53 06/06/2012 1150   BILITOT 1.3* 06/06/2012 1150   GFRNONAA 58* 06/10/2012 2057   GFRAA 67* 06/10/2012 2057       Component Value Date/Time   WBC 5.0 06/06/2012 1150   WBC 6.0 03/22/2012 0105   WBC 5.9 07/27/2010 0927   HGB 16.0 06/06/2012 1150   HGB 16.3 03/22/2012 0105   HGB 14.2 07/27/2010 0927   HCT 47.8 06/06/2012 1150   HCT 48.0 03/22/2012 0105   HCT 42.7 07/27/2010 0927   MCV 88.0 06/06/2012 1150   MCV 87.8 03/22/2012 0105   MCV 84.2 07/27/2010 0927    Lipid Panel     Component Value Date/Time   CHOL  Value: 95        ATP III CLASSIFICATION:  <200     mg/dL   Desirable  161-096  mg/dL   Borderline High  >=045    mg/dL   High        07/14/8117 0630   TRIG 101 07/27/2010 0630   HDL 35* 07/27/2010 0630   CHOLHDL 2.7 07/27/2010 0630   VLDL 20 07/27/2010 0630   LDLCALC  Value: 40         Total Cholesterol/HDL:CHD Risk Coronary Heart Disease Risk Table                     Men   Women  1/2 Average Risk   3.4   3.3  Average Risk       5.0   4.4  2 X Average Risk   9.6   7.1  3 X Average Risk  23.4   11.0        Use the calculated Patient Ratio above and the CHD Risk Table to determine the patient's CHD Risk.        ATP III CLASSIFICATION (LDL):  <100     mg/dL   Optimal  147-829  mg/dL   Near or Above                    Optimal  130-159  mg/dL   Borderline  562-130  mg/dL   High  >865     mg/dL   Very High 7/84/6962 9528    ABG    Component Value Date/Time   PHART 7.443 03/30/2010 1113   PCO2ART 30.6* 03/30/2010 1113   PO2ART 62.1* 03/30/2010 1113   HCO3 20.6 03/30/2010 1113   TCO2 18.5 03/30/2010 1113   ACIDBASEDEF 2.8* 03/30/2010 1113   O2SAT 90.1 03/30/2010 1113     Lab Results  Component Value Date   TSH 1.043 06/07/2012   BNP (last 3 results) No results found for this basename: PROBNP,  in the last 8760 hours Cardiac Panel (last 3 results) No results found for this basename: CKTOTAL, CKMB, TROPONINI, RELINDX,  in the last 72 hours  Iron/TIBC/Ferritin No results found for this basename: iron, tibc, ferritin     EKG Orders placed in visit on 08/23/12  . CARDIAC EVENT MONITOR     Prior Assessment and Plan Problem List as of 10/19/2012   HYPERLIPIDEMIA   Last Assessment & Plan   07/14/2012 Office Visit Written 07/14/2012  2:29 PM by Kathlen Brunswick, MD  Very low total and LDL cholesterol. Current therapy will be continued.    HYPERTENSION   Last Assessment & Plan   07/14/2012 Office Visit Written 07/14/2012  2:28 PM by Kathlen Brunswick, MD     Blood pressure control has been excellent. Patient advised to monitor at local pharmacies.  Discontinuation of Bidil may result in some increase, but this will probably not be significant.    Arteriosclerotic cardiovascular disease (ASCVD)   Last Assessment & Plan   07/14/2012 Office Visit Edited 07/24/2012  5:30 PM by  Kathlen Brunswick, MD     No symptoms to suggest active myocardial ischemia. Improved ejection fraction by echocardiography.  Cost of Bidil represents a significant financial problem for the patient. Benefit of treatment is speculative with fairly good left ventricular systolic function-> discontinue. Patient has suffered possible serious allergic reaction to ACE inhibitor, but could be treated with ARB if necessary. I will reassess his symptoms in 3 months and decide at that time.    Pulmonary embolism   Last Assessment & Plan   07/14/2012 Office Visit Edited 07/24/2012  5:31 PM by Kathlen Brunswick, MD     No evidence for recurrent DVT/pulmonary embolism. We will continue to manage with full anticoagulation.    CVA (cerebral infarction)   Last Assessment & Plan   07/14/2012 Office Visit Written 07/14/2012  2:26 PM by Kathlen Brunswick, MD     Mechanism of CVA uncertain. Patient currently doing well on dual antiplatelet therapy.        Imaging: No results found.

## 2012-10-22 ENCOUNTER — Ambulatory Visit (INDEPENDENT_AMBULATORY_CARE_PROVIDER_SITE_OTHER): Payer: PRIVATE HEALTH INSURANCE | Admitting: *Deleted

## 2012-10-22 DIAGNOSIS — Z7901 Long term (current) use of anticoagulants: Secondary | ICD-10-CM

## 2012-10-22 LAB — POC HEMOCCULT BLD/STL (HOME/3-CARD/SCREEN)
Card #2 Fecal Occult Blod, POC: NEGATIVE
Card #3 Fecal Occult Blood, POC: NEGATIVE
Fecal Occult Blood, POC: NEGATIVE

## 2012-10-25 ENCOUNTER — Encounter: Payer: Self-pay | Admitting: *Deleted

## 2012-11-01 ENCOUNTER — Encounter: Payer: Self-pay | Admitting: Cardiology

## 2012-11-01 DIAGNOSIS — G4731 Primary central sleep apnea: Secondary | ICD-10-CM | POA: Insufficient documentation

## 2012-11-01 NOTE — Assessment & Plan Note (Signed)
No symptoms at present to suggest myocardial ischemia. Will continue attempts to optimally control cardiovascular risk factors.

## 2012-11-01 NOTE — Assessment & Plan Note (Signed)
Patient is overdue for screening colonoscopy. He is advised to contact his gastroenterologist.

## 2012-11-01 NOTE — Assessment & Plan Note (Signed)
Evaluation and treatment are in progress.

## 2012-11-01 NOTE — Assessment & Plan Note (Signed)
No elevated blood pressures recorded over the past 4 months. Current therapy will be maintained.

## 2012-11-01 NOTE — Assessment & Plan Note (Signed)
Lipid profile earlier this year was excellent with current therapy, which will be continued.

## 2013-02-01 ENCOUNTER — Other Ambulatory Visit: Payer: Self-pay | Admitting: Cardiology

## 2013-05-27 ENCOUNTER — Telehealth: Payer: Self-pay

## 2013-05-27 ENCOUNTER — Telehealth: Payer: Self-pay | Admitting: *Deleted

## 2013-05-27 ENCOUNTER — Telehealth: Payer: Self-pay | Admitting: Adult Health

## 2013-05-27 MED ORDER — METOPROLOL SUCCINATE ER 50 MG PO TB24: 50.0000 mg | ORAL_TABLET | Freq: Every day | ORAL | Status: DC

## 2013-05-27 NOTE — Telephone Encounter (Signed)
Received fax refill request  Rx #  Medication:  Metoprolol Succ ER 50 mg tab Qty 90 Sig:  Take one tablet once daily Physician:  Purcell Nails

## 2013-05-27 NOTE — Telephone Encounter (Signed)
rx already filled earlier today

## 2013-05-27 NOTE — Telephone Encounter (Signed)
rx refilled per request need recall apt with new MD

## 2013-05-27 NOTE — Telephone Encounter (Signed)
Faxed RX for [facility] f [id number] p [id number] Metoprolol succ er 50 mg #90

## 2013-06-06 NOTE — Telephone Encounter (Signed)
Opened in error

## 2013-06-23 ENCOUNTER — Encounter: Payer: Self-pay | Admitting: Adult Health

## 2013-07-05 ENCOUNTER — Encounter: Payer: Self-pay | Admitting: Adult Health

## 2013-07-05 ENCOUNTER — Ambulatory Visit (INDEPENDENT_AMBULATORY_CARE_PROVIDER_SITE_OTHER): Payer: PRIVATE HEALTH INSURANCE | Admitting: Adult Health

## 2013-07-05 VITALS — BP 141/68 | HR 67 | Ht 66.0 in | Wt 220.0 lb

## 2013-07-05 DIAGNOSIS — I2510 Atherosclerotic heart disease of native coronary artery without angina pectoris: Secondary | ICD-10-CM

## 2013-07-05 DIAGNOSIS — I7090 Unspecified atherosclerosis: Secondary | ICD-10-CM

## 2013-07-05 DIAGNOSIS — I6350 Cerebral infarction due to unspecified occlusion or stenosis of unspecified cerebral artery: Secondary | ICD-10-CM

## 2013-07-05 DIAGNOSIS — I10 Essential (primary) hypertension: Secondary | ICD-10-CM

## 2013-07-05 DIAGNOSIS — I2699 Other pulmonary embolism without acute cor pulmonale: Secondary | ICD-10-CM

## 2013-07-05 DIAGNOSIS — I639 Cerebral infarction, unspecified: Secondary | ICD-10-CM

## 2013-07-05 MED ORDER — METOPROLOL SUCCINATE ER 50 MG PO TB24: 50.0000 mg | ORAL_TABLET | Freq: Every day | ORAL | Status: DC

## 2013-07-05 MED ORDER — NITROGLYCERIN 0.4 MG SL SUBL: 0.4000 mg | SUBLINGUAL_TABLET | SUBLINGUAL | Status: AC | PRN

## 2013-07-05 MED ORDER — CLOPIDOGREL BISULFATE 75 MG PO TABS: 75.0000 mg | ORAL_TABLET | Freq: Every day | ORAL | Status: DC

## 2013-07-05 NOTE — Assessment & Plan Note (Signed)
Currently controlled. I will refill metoprolol. He is advised on low sodium diet and to increase activity.

## 2013-07-05 NOTE — Progress Notes (Deleted)
Name: [name]    DOB: Jul 14, 1945  Age: 68 y.o.  MR#: [id number]       PCP:  Rosita Fire, MD      Insurance: Payor: [employer] MEDICARE / Plan: Cottie Banda / Product Type: *No Product type* /   CC:    Chief Complaint  Patient presents with  . Coronary Artery Disease  . Hypertension    VS Filed Vitals:   07/05/13 1255  BP: 141/68  Pulse: 67  Height: 5\' 6"  (1.676 m)  Weight: 220 lb (99.791 kg)    Weights Current Weight  07/05/13 220 lb (99.791 kg)  10/19/12 221 lb 4 oz (100.358 kg)  10/13/12 220 lb (99.791 kg)    Blood Pressure  BP Readings from Last 3 Encounters:  07/05/13 141/68  10/19/12 135/75  08/03/12 135/80     Admit date:  (Not on file) Last encounter with RMR:  05/27/2013   Allergy Lisinopril  Current Outpatient Prescriptions  Medication Sig Dispense Refill  . clopidogrel (PLAVIX) 75 MG tablet Take 1 tablet (75 mg total) by mouth daily with breakfast.  30 tablet  3  . metoprolol succinate (TOPROL-XL) 50 MG 24 hr tablet Take 1 tablet (50 mg total) by mouth daily. Take with or immediately following a meal.  60 tablet  1  . nitroGLYCERIN (NITROSTAT) 0.4 MG SL tablet Place 0.4 mg under the tongue. Take 1 tablet under tongue at onset of chest pain; you may repeat every 5 minutes for up to 3 doses.      . simvastatin (ZOCOR) 40 MG tablet TAKE 1 TABLET BY MOUTH EVERY EVENING  30 tablet  6  . traMADol (ULTRAM) 50 MG tablet Take 50 mg by mouth every 6 (six) hours as needed. For pain. Patient takes every morning but does not take every evening       No current facility-administered medications for this visit.    Discontinued Meds:   There are no discontinued medications.  Patient Active Problem List   Diagnosis Date Noted  . 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 Chronic anticoagulation-discontinued 10/19/2012  . CVA (cerebral infarction) 06/09/2012  . Arteriosclerotic cardiovascular disease (ASCVD)   . Pulmonary embolism 03/07/2010  . HYPERLIPIDEMIA  11/24/2008  . HYPERTENSION 11/24/2008    LABS    Component Value Date/Time   NA 136 06/10/2012 2057   NA 133* 06/06/2012 1150   NA 138 03/22/2012 0105   K 3.8 06/10/2012 2057   K 4.1 06/06/2012 1150   K 3.5 03/22/2012 0105   CL 99 06/10/2012 2057   CL 100 06/06/2012 1150   CL 103 03/22/2012 0105   CO2 28 06/10/2012 2057   CO2 26 06/06/2012 1150   CO2 25 03/22/2012 0105   GLUCOSE 97 06/10/2012 2057   GLUCOSE 98 06/06/2012 1150   GLUCOSE 110* 03/22/2012 0105   BUN 10 06/10/2012 2057   BUN 10 06/06/2012 1150   BUN 13 03/22/2012 0105   CREATININE 1.27 06/10/2012 2057   CREATININE 1.24 06/06/2012 1150   CREATININE 1.29 03/22/2012 0105   CALCIUM 9.2 06/10/2012 2057   CALCIUM 9.3 06/06/2012 1150   CALCIUM 9.3 03/22/2012 0105   GFRNONAA 58* 06/10/2012 2057   GFRNONAA 59* 06/06/2012 1150   GFRNONAA 57* 03/22/2012 0105   GFRAA 67* 06/10/2012 2057   GFRAA 69* 06/06/2012 1150   GFRAA 66* 03/22/2012 0105   CMP     Component Value Date/Time   NA 136 06/10/2012 2057   K 3.8 06/10/2012 2057   CL  99 06/10/2012 2057   CO2 28 06/10/2012 2057   GLUCOSE 97 06/10/2012 2057   BUN 10 06/10/2012 2057   CREATININE 1.27 06/10/2012 2057   CALCIUM 9.2 06/10/2012 2057   PROT 7.5 06/06/2012 1150   ALBUMIN 4.0 06/06/2012 1150   AST 21 06/06/2012 1150   ALT 24 06/06/2012 1150   ALKPHOS 53 06/06/2012 1150   BILITOT 1.3* 06/06/2012 1150   GFRNONAA 58* 06/10/2012 2057   GFRAA 67* 06/10/2012 2057       Component Value Date/Time   WBC 5.0 06/06/2012 1150   WBC 6.0 03/22/2012 0105   WBC 5.9 07/27/2010 0927   HGB 16.0 06/06/2012 1150   HGB 16.3 03/22/2012 0105   HGB 14.2 07/27/2010 0927   HCT 47.8 06/06/2012 1150   HCT 48.0 03/22/2012 0105   HCT 42.7 07/27/2010 0927   MCV 88.0 06/06/2012 1150   MCV 87.8 03/22/2012 0105   MCV 84.2 07/27/2010 0927    Lipid Panel     Component Value Date/Time   CHOL  Value: 95        ATP III CLASSIFICATION:  <200     mg/dL   Desirable  200-239  mg/dL   Borderline High  >=240    mg/dL   High        07/27/2010 0630   TRIG 101  07/27/2010 0630   HDL 35* 07/27/2010 0630   CHOLHDL 2.7 07/27/2010 0630   VLDL 20 07/27/2010 0630   LDLCALC  Value: 40        Total Cholesterol/HDL:CHD Risk Coronary Heart Disease Risk Table                     Men   Women  1/2 Average Risk   3.4   3.3  Average Risk       5.0   4.4  2 X Average Risk   9.6   7.1  3 X Average Risk  23.4   11.0        Use the calculated Patient Ratio above and the CHD Risk Table to determine the patient's CHD Risk.        ATP III CLASSIFICATION (LDL):  <100     mg/dL   Optimal  100-129  mg/dL   Near or Above                    Optimal  130-159  mg/dL   Borderline  160-189  mg/dL   High  >190     mg/dL   Very High 07/27/2010 0630    ABG    Component Value Date/Time   PHART 7.443 03/30/2010 1113   PCO2ART 30.6* 03/30/2010 1113   PO2ART 62.1* 03/30/2010 1113   HCO3 20.6 03/30/2010 1113   TCO2 18.5 03/30/2010 1113   ACIDBASEDEF 2.8* 03/30/2010 1113   O2SAT 90.1 03/30/2010 1113     Lab Results  Component Value Date   TSH 1.043 06/07/2012   BNP (last 3 results) No results found for this basename: PROBNP,  in the last 8760 hours Cardiac Panel (last 3 results) No results found for this basename: CKTOTAL, CKMB, TROPONINI, RELINDX,  in the last 72 hours  Iron/TIBC/Ferritin No results found for this basename: iron, tibc, ferritin     EKG Orders placed in visit on 08/23/12  . CARDIAC EVENT MONITOR     Prior Assessment and Plan Problem List as of 07/05/2013     Cardiovascular and Mediastinum   HYPERTENSION  Last Assessment & Plan   10/19/2012 Office Visit Written 11/01/2012  9:42 PM by Yehuda Savannah, MD     No elevated blood pressures recorded over the past 4 months. Current therapy will be maintained.    Arteriosclerotic cardiovascular disease (ASCVD)   Last Assessment & Plan   10/19/2012 Office Visit Written 11/01/2012  9:39 PM by Yehuda Savannah, MD     No symptoms at present to suggest myocardial ischemia. Will continue attempts to optimally control  cardiovascular risk factors.    Pulmonary embolism   Last Assessment & Plan   07/14/2012 Office Visit Edited 07/24/2012  5:31 PM by Yehuda Savannah, MD     No evidence for recurrent DVT/pulmonary embolism. We will continue to manage with full anticoagulation.      Nervous and Auditory   CVA (cerebral infarction)   Last Assessment & Plan   07/14/2012 Office Visit Written 07/14/2012  2:26 PM by Yehuda Savannah, MD     Mechanism of CVA uncertain. Patient currently doing well on dual antiplatelet therapy.      Other   HYPERLIPIDEMIA   Last Assessment & Plan   10/19/2012 Office Visit Written 11/01/2012  9:41 PM by Yehuda Savannah, MD      Lipid profile earlier this year was excellent with current therapy, which will be continued.    Chronic anticoagulation-discontinued   Last Assessment & Plan   10/19/2012 Office Visit Written 11/01/2012  9:41 PM by Yehuda Savannah, MD     Patient is overdue for screening colonoscopy. He is advised to contact his gastroenterologist.    Central sleep apnea   Last Assessment & Plan   10/19/2012 Office Visit Written 11/01/2012  9:40 PM by Yehuda Savannah, MD     Evaluation and treatment are in progress.        Imaging: No results found.

## 2013-07-05 NOTE — Assessment & Plan Note (Signed)
Has been taken off of coumadin by PCP, Dr. Legrand Rams. He has changed him to Plavix 75 mg daily.

## 2013-07-05 NOTE — Progress Notes (Signed)
HPI: [name] is a 68 year old patient to be est. with [name] we are following for ongoing assessment and management of CAD, history of CVA, and history of pulmonary embolism. The patient was last seen in the office in July 2014 by [name]. Other history includes instructed sleep apnea and hypertension. At last office visit the patient was stable, no further testing was planned at that time.    He comes today without complaints. He is medically compliant. He has not been in the ER or had admissions since being seen last. [name] has taken him off of the coumadin and changed him to Plavix.       Allergies  Allergen Reactions  . Lisinopril     Mouth and tongue swells    Current Outpatient Prescriptions  Medication Sig Dispense Refill  . clopidogrel (PLAVIX) 75 MG tablet Take 1 tablet (75 mg total) by mouth daily with breakfast.  30 tablet  3  . metoprolol succinate (TOPROL-XL) 50 MG 24 hr tablet Take 1 tablet (50 mg total) by mouth daily. Take with or immediately following a meal.  60 tablet  1  . nitroGLYCERIN (NITROSTAT) 0.4 MG SL tablet Place 0.4 mg under the tongue. Take 1 tablet under tongue at onset of chest pain; you may repeat every 5 minutes for up to 3 doses.      . simvastatin (ZOCOR) 40 MG tablet TAKE 1 TABLET BY MOUTH EVERY EVENING  30 tablet  6  . traMADol (ULTRAM) 50 MG tablet Take 50 mg by mouth every 6 (six) hours as needed. For pain. Patient takes every morning but does not take every evening       No current facility-administered medications for this visit.    Past Medical History  Diagnosis Date  . Arteriosclerotic cardiovascular disease (ASCVD) 1998    Inferior MI->PCI of the RCA in 1998; acute anterior MI in 06/2002->  PCI of RCA and LAD with DES x2 to RCA, residual 80% ostial D1, and 80% mid CX and EF-40%; CABG-2011, LIMA-LAD, SVG to D1, OM1 & OM2; EF of 35-40% in 07/2010  . Substance abuse     formerly cocaine  . Hyperlipidemia   . Pulmonary embolism  03/2010  . Chronic anticoagulation   . Keloid     median sternotomy    Past Surgical History  Procedure Laterality Date  . Ptca  06/1996    LAD & RCA  . 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 Tee without cardioversion N/A 06/10/2012    Procedure: TRANSESOPHAGEAL ECHOCARDIOGRAM (TEE);  Surgeon: Josue Hector, MD;  Location: [facility];  Service: Cardiovascular;  Laterality: N/A;    YJE:HUDJSH of systems complete and found to be negative unless listed above  PHYSICAL EXAM BP 141/68  Pulse 67  Ht 5\' 6"  (1.676 m)  Wt 220 lb (99.791 kg)  BMI 35.53 kg/m2  General: Well developed, well nourished, in no acute distress Head: Eyes PERRLA, No xanthomas.   Normal cephalic and atramatic  Lungs: Clear bilaterally to auscultation and percussion. Heart: HRRR S1 S2, with soft 1.6 systolic murmur..  Pulses are 2+ & equal.            No carotid bruit. No JVD.  No abdominal bruits. No femoral bruits. Abdomen: Bowel sounds are positive, abdomen soft and non-tender without masses or  Hernia's noted. Msk:  Back normal, normal gait. Normal strength and tone for age. Extremities: No clubbing, cyanosis or edema.  DP +1 Neuro: Alert and oriented X 3. Psych:  Good affect, responds appropriately  EKG: NSR with evidence of prior anterior infarct and conduction delay Rate of 68 bpm.  ASSESSMENT AND PLAN

## 2013-07-05 NOTE — Patient Instructions (Signed)
Your physician recommends that you schedule a follow-up appointment in: 1 year with Dr Virgina Jock will receive a reminder letter two months in advance reminding you to call and schedule your appointment. If you don't receive this letter, please contact our office.  Your physician recommends that you return for lab work today. Lipid/LFTs

## 2013-07-05 NOTE — Assessment & Plan Note (Signed)
He has no residual affects of weakness or neurologic deficits.

## 2013-07-05 NOTE — Assessment & Plan Note (Signed)
He is currently asymptomatic. I will provide refills on NTG sublingual. He has not had to use his current supply and has the same bottle in his pocket from one year ago. I will continue risk management. Fasting lipids and LFTS will be drawn.

## 2013-07-07 ENCOUNTER — Emergency Department (HOSPITAL_COMMUNITY)
Admission: EM | Admit: 2013-07-07 | Discharge: 2013-07-07 | Disposition: A | Discharge status: Home / Self Care | Payer: PRIVATE HEALTH INSURANCE | Attending: Emergency Medicine | Admitting: Emergency Medicine

## 2013-07-07 ENCOUNTER — Telehealth: Payer: Self-pay

## 2013-07-07 ENCOUNTER — Encounter (HOSPITAL_COMMUNITY): Payer: Self-pay | Admitting: Emergency Medicine

## 2013-07-07 DIAGNOSIS — Z9861 Coronary angioplasty status: Secondary | ICD-10-CM | POA: Insufficient documentation

## 2013-07-07 DIAGNOSIS — E785 Hyperlipidemia, unspecified: Secondary | ICD-10-CM | POA: Insufficient documentation

## 2013-07-07 DIAGNOSIS — R5381 Other malaise: Secondary | ICD-10-CM | POA: Insufficient documentation

## 2013-07-07 DIAGNOSIS — Z951 Presence of aortocoronary bypass graft: Secondary | ICD-10-CM | POA: Insufficient documentation

## 2013-07-07 DIAGNOSIS — R5383 Other fatigue: Secondary | ICD-10-CM | POA: Insufficient documentation

## 2013-07-07 DIAGNOSIS — I2510 Atherosclerotic heart disease of native coronary artery without angina pectoris: Secondary | ICD-10-CM | POA: Insufficient documentation

## 2013-07-07 DIAGNOSIS — R29898 Other symptoms and signs involving the musculoskeletal system: Secondary | ICD-10-CM | POA: Insufficient documentation

## 2013-07-07 DIAGNOSIS — R110 Nausea: Secondary | ICD-10-CM | POA: Insufficient documentation

## 2013-07-07 DIAGNOSIS — R531 Weakness: Secondary | ICD-10-CM

## 2013-07-07 DIAGNOSIS — Z79899 Other long term (current) drug therapy: Secondary | ICD-10-CM | POA: Insufficient documentation

## 2013-07-07 DIAGNOSIS — Z86711 Personal history of pulmonary embolism: Secondary | ICD-10-CM | POA: Insufficient documentation

## 2013-07-07 DIAGNOSIS — Z872 Personal history of diseases of the skin and subcutaneous tissue: Secondary | ICD-10-CM | POA: Insufficient documentation

## 2013-07-07 DIAGNOSIS — Z7901 Long term (current) use of anticoagulants: Secondary | ICD-10-CM | POA: Insufficient documentation

## 2013-07-07 DIAGNOSIS — Z87891 Personal history of nicotine dependence: Secondary | ICD-10-CM | POA: Insufficient documentation

## 2013-07-07 LAB — CBC WITH DIFFERENTIAL/PLATELET
Basophils Absolute: 0 10*3/uL (ref 0.0–0.1)
Basophils Relative: 1 % (ref 0–1)
Eosinophils Absolute: 0.2 10*3/uL (ref 0.0–0.7)
Eosinophils Relative: 3 % (ref 0–5)
HCT: 45.5 % (ref 39.0–52.0)
Hemoglobin: 14.9 g/dL (ref 13.0–17.0)
Lymphocytes Relative: 50 % — ABNORMAL HIGH (ref 12–46)
Lymphs Abs: 2.8 10*3/uL (ref 0.7–4.0)
MCH: 29.3 pg (ref 26.0–34.0)
MCHC: 32.7 g/dL (ref 30.0–36.0)
MCV: 89.6 fL (ref 78.0–100.0)
Monocytes Absolute: 0.5 10*3/uL (ref 0.1–1.0)
Monocytes Relative: 9 % (ref 3–12)
Neutro Abs: 2 10*3/uL (ref 1.7–7.7)
Neutrophils Relative %: 37 % — ABNORMAL LOW (ref 43–77)
Platelets: 137 10*3/uL — ABNORMAL LOW (ref 150–400)
RBC: 5.08 MIL/uL (ref 4.22–5.81)
RDW: 13.3 % (ref 11.5–15.5)
WBC: 5.5 10*3/uL (ref 4.0–10.5)

## 2013-07-07 LAB — HEPATIC FUNCTION PANEL
ALT: 25 U/L (ref 0–53)
AST: 24 U/L (ref 0–37)
Albumin: 4.1 g/dL (ref 3.5–5.2)
Alkaline Phosphatase: 45 U/L (ref 39–117)
Bilirubin, Direct: 0.4 mg/dL — ABNORMAL HIGH (ref 0.0–0.3)
Indirect Bilirubin: 1.2 mg/dL (ref 0.2–1.2)
Total Bilirubin: 1.6 mg/dL — ABNORMAL HIGH (ref 0.2–1.2)
Total Protein: 7 g/dL (ref 6.0–8.3)

## 2013-07-07 LAB — BASIC METABOLIC PANEL
BUN: 13 mg/dL (ref 6–23)
CO2: 31 mEq/L (ref 19–32)
Calcium: 9.5 mg/dL (ref 8.4–10.5)
Chloride: 103 mEq/L (ref 96–112)
Creatinine, Ser: 1.3 mg/dL (ref 0.50–1.35)
GFR calc Af Amer: 64 mL/min — ABNORMAL LOW (ref >90)
GFR calc non Af Amer: 55 mL/min — ABNORMAL LOW (ref >90)
Glucose, Bld: 109 mg/dL — ABNORMAL HIGH (ref 70–99)
Potassium: 3.9 mEq/L (ref 3.7–5.3)
Sodium: 141 mEq/L (ref 137–147)

## 2013-07-07 LAB — LIPID PANEL
Cholesterol: 78 mg/dL (ref 0–200)
HDL: 39 mg/dL — ABNORMAL LOW (ref >39)
LDL Cholesterol: 23 mg/dL (ref 0–99)
Total CHOL/HDL Ratio: 2 Ratio
Triglycerides: 82 mg/dL (ref <150)
VLDL: 16 mg/dL (ref 0–40)

## 2013-07-07 NOTE — ED Notes (Signed)
Discharge instructions given and reviewed with patient.  Patient verbalized understanding to follow up with PMD as needed.  Patient ambulatory; discharged home in good condition.

## 2013-07-07 NOTE — Discharge Instructions (Signed)
All your tests were normal. Followup your primary care Dr.

## 2013-07-07 NOTE — Telephone Encounter (Signed)
Message copied by Bernita Raisin on Thu Jul 07, 2013  5:10 PM ------      Message from: Lendon Colonel      Created: Thu Jul 07, 2013  4:49 PM       Change lipitor to pravachol 20 mg daily due to mildly elevated liver enzymes. ------

## 2013-07-07 NOTE — ED Notes (Signed)
Dr Cook at bedside

## 2013-07-07 NOTE — ED Notes (Signed)
Patient awaiting disposition.  Family member upset that is taking so long.

## 2013-07-07 NOTE — ED Provider Notes (Signed)
CSN: [id number]     Arrival date & time 07/07/13  1752 History  This chart was scribed for Nat Christen, MD by Elby Beck, ED Scribe. This patient was seen in room [facility]16A/[facility]A and the patient's care was started at 7:26 PM.   Chief Complaint  Patient presents with  . Fatigue    The history is provided by the patient. No language interpreter was used.    HPI Comments: [name] is a 68 y.o. Male with a history of PE, HLD, ASCVD and substance abuse who presents to the Emergency Department complaining of intermittent, unchanged fatigue onset today. He reports associated nausea. He also states that he has had associated leg weakness with standing recently. He reports having a history of coronary artery bypass graft in 2011 by a surgeon in [place].  PCP- Dr. Rosita Fire Pt has been [facility] at [facility]  Past Medical History  Diagnosis Date  . Arteriosclerotic cardiovascular disease (ASCVD) 1998    Inferior MI->PCI of the RCA in 1998; acute anterior MI in 06/2002->  PCI of RCA and LAD with DES x2 to RCA, residual 80% ostial D1, and 80% mid CX and EF-40%; CABG-2011, LIMA-LAD, SVG to D1, OM1 & OM2; EF of 35-40% in 07/2010  . Substance abuse     formerly cocaine  . Hyperlipidemia   . Pulmonary embolism 03/2010  . Chronic anticoagulation   . Keloid     median sternotomy   Past Surgical History  Procedure Laterality Date  . Ptca  06/1996    LAD & RCA  . 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 Tee without cardioversion N/A 06/10/2012    Procedure: TRANSESOPHAGEAL ECHOCARDIOGRAM (TEE);  Surgeon: Josue Hector, MD;  Location: [facility];  Service: Cardiovascular;  Laterality: N/A;   Family History  Problem Relation Age of Onset  . Cancer Mother 1   History  Substance Use Topics  . Smoking status: Former Research scientist (life sciences)  . Smokeless tobacco: Not on file  . Alcohol Use: No    Review of Systems A complete 10 system review of systems  was obtained and all systems are negative except as noted in the HPI and PMH.   Allergies  Lisinopril  Home Medications   Current Outpatient Rx  Name  Route  Sig  Dispense  Refill  . clopidogrel (PLAVIX) 75 MG tablet   Oral   Take 1 tablet (75 mg total) by mouth daily with breakfast.   30 tablet   11   . metoprolol succinate (TOPROL-XL) 50 MG 24 hr tablet   Oral   Take 1 tablet (50 mg total) by mouth daily. Take with or immediately following a meal.   30 tablet   11   . simvastatin (ZOCOR) 40 MG tablet   Oral   Take 40 mg by mouth every evening.         . traMADol (ULTRAM) 50 MG tablet   Oral   Take 50 mg by mouth every 6 (six) hours as needed. For pain. Patient takes every morning but does not take every evening         . nitroGLYCERIN (NITROSTAT) 0.4 MG SL tablet   Sublingual   Place 1 tablet (0.4 mg total) under the tongue every 5 (five) minutes as needed for chest pain. up to 3 doses.   25 tablet   4    Triage Vitals: BP 138/80  Pulse 59  Temp(Src) 97.5 F (36.4 C) (Oral)  Resp 18  Ht 5\' 6"  (1.676 m)  Wt 221 lb (100.245 kg)  BMI 35.69 kg/m2  SpO2 97%  Physical Exam  Nursing note and vitals reviewed. Constitutional: He is oriented to person, place, and time. He appears well-developed and well-nourished.  HENT:  Head: Normocephalic and atraumatic.  Eyes: Conjunctivae and EOM are normal. Pupils are equal, round, and reactive to light.  Neck: Normal range of motion. Neck supple.  Cardiovascular: Normal rate, regular rhythm and normal heart sounds.   Pulmonary/Chest: Effort normal and breath sounds normal.  Abdominal: Soft. Bowel sounds are normal.  Musculoskeletal: Normal range of motion.  Neurological: He is alert and oriented to person, place, and time.  Skin: Skin is warm and dry.  Psychiatric: He has a normal mood and affect. His behavior is normal.    ED Course  Procedures (including critical care time)  DIAGNOSTIC STUDIES: Oxygen Saturation  is 97% on RA, normal by my interpretation.    COORDINATION OF CARE: 7:31 PM- Discussed plan to obtain blood work and an EKG. Pt advised of plan for treatment and pt agrees.  Labs Review Labs Reviewed  BASIC METABOLIC PANEL - Abnormal; Notable for the following:    Glucose, Bld 109 (*)    GFR calc non Af Amer 55 (*)    GFR calc Af Amer 64 (*)    All other components within normal limits  CBC WITH DIFFERENTIAL - Abnormal; Notable for the following:    Platelets 137 (*)    Neutrophils Relative % 37 (*)    Lymphocytes Relative 50 (*)    All other components within normal limits   Imaging Review No results found.   EKG Interpretation   Date/Time:  Thursday July 07 2013 20:40:04 EDT Ventricular Rate:  53 PR Interval:  124 QRS Duration: 94 QT Interval:  446 QTC Calculation: 418 R Axis:   38 Text Interpretation:  Sinus bradycardia Possible Left atrial enlargement  Inferior infarct (cited on or before 17-Jun-2002) Anterolateral infarct  (cited on or before 17-Jun-2002) Abnormal ECG When compared with ECG of  06-Jun-2012 11:29, Serial changes of Anterior infarct Present Confirmed by  Jerusalem Brownstein  MD, Kiyan Burmester ([id number]) on 07/07/2013 10:55:04 PM      MDM   Final diagnoses:  Weakness    Patient's normal physical exam. Hemoglobin, glucose, EKG all normal. Patient has primary care followup.   I personally performed the services described in this documentation, which was scribed in my presence. The recorded information has been reviewed and is accurate.  Nat Christen, MD 07/07/13 2325

## 2013-07-07 NOTE — ED Notes (Signed)
Pt c/o feeling tired and weak today. Pt seen by pcp yesterday for check up. Denies pain/sob/dizziness.

## 2013-07-18 MED ORDER — PRAVASTATIN SODIUM 20 MG PO TABS: 20.0000 mg | ORAL_TABLET | Freq: Every evening | ORAL | Status: DC

## 2013-07-18 NOTE — Telephone Encounter (Signed)
Spoke with wife,message relayed,pt will start pravachol

## 2013-07-18 NOTE — Telephone Encounter (Signed)
Message copied by Bernita Raisin on Mon Jul 18, 2013  8:53 AM ------      Message from: Lendon Colonel      Created: Mon Jul 18, 2013  7:45 AM       Yes       ------

## 2013-08-01 ENCOUNTER — Telehealth: Payer: Self-pay | Admitting: Adult Health

## 2013-08-01 NOTE — Telephone Encounter (Signed)
Pt takes 50 mg once a day of metoprolol.

## 2013-08-01 NOTE — Telephone Encounter (Signed)
Questions regarding dosage of Metoprolol. / tgs

## 2013-08-12 ENCOUNTER — Telehealth: Payer: Self-pay | Admitting: *Deleted

## 2013-08-12 NOTE — Telephone Encounter (Signed)
Pt needs [place] to call [facility] they are still saying that we did not call in metoprolol. Pt has been out for a few days

## 2013-08-12 NOTE — Telephone Encounter (Signed)
Called [facility] pharmacy. Pharmacist stated that the pt ran out a week early and insurance would not pay for extra. Pt is now ready to pick up medication today. Made pt aware

## 2014-04-24 DIAGNOSIS — E784 Other hyperlipidemia: Secondary | ICD-10-CM | POA: Diagnosis not present

## 2014-04-24 DIAGNOSIS — I10 Essential (primary) hypertension: Secondary | ICD-10-CM | POA: Diagnosis not present

## 2014-06-05 DIAGNOSIS — I10 Essential (primary) hypertension: Secondary | ICD-10-CM | POA: Diagnosis not present

## 2014-06-05 DIAGNOSIS — E784 Other hyperlipidemia: Secondary | ICD-10-CM | POA: Diagnosis not present

## 2014-06-09 ENCOUNTER — Other Ambulatory Visit: Payer: Self-pay | Admitting: Adult Health

## 2014-07-03 ENCOUNTER — Other Ambulatory Visit: Payer: Self-pay | Admitting: Adult Health

## 2014-07-12 ENCOUNTER — Other Ambulatory Visit: Payer: Self-pay | Admitting: Adult Health

## 2014-08-01 DIAGNOSIS — E784 Other hyperlipidemia: Secondary | ICD-10-CM | POA: Diagnosis not present

## 2014-08-01 DIAGNOSIS — I10 Essential (primary) hypertension: Secondary | ICD-10-CM | POA: Diagnosis not present

## 2014-08-12 ENCOUNTER — Other Ambulatory Visit: Payer: Self-pay | Admitting: Adult Health

## 2014-08-14 ENCOUNTER — Other Ambulatory Visit: Payer: Self-pay | Admitting: Adult Health

## 2014-08-28 DIAGNOSIS — I499 Cardiac arrhythmia, unspecified: Secondary | ICD-10-CM | POA: Diagnosis not present

## 2014-08-28 DIAGNOSIS — I2510 Atherosclerotic heart disease of native coronary artery without angina pectoris: Secondary | ICD-10-CM | POA: Diagnosis not present

## 2014-08-28 DIAGNOSIS — E669 Obesity, unspecified: Secondary | ICD-10-CM | POA: Diagnosis not present

## 2014-08-28 DIAGNOSIS — G4733 Obstructive sleep apnea (adult) (pediatric): Secondary | ICD-10-CM | POA: Diagnosis not present

## 2014-08-28 DIAGNOSIS — R739 Hyperglycemia, unspecified: Secondary | ICD-10-CM | POA: Diagnosis not present

## 2014-08-28 DIAGNOSIS — I6350 Cerebral infarction due to unspecified occlusion or stenosis of unspecified cerebral artery: Secondary | ICD-10-CM | POA: Diagnosis not present

## 2014-08-28 DIAGNOSIS — I10 Essential (primary) hypertension: Secondary | ICD-10-CM | POA: Diagnosis not present

## 2014-08-28 DIAGNOSIS — G4731 Primary central sleep apnea: Secondary | ICD-10-CM | POA: Diagnosis not present

## 2014-08-28 DIAGNOSIS — E784 Other hyperlipidemia: Secondary | ICD-10-CM | POA: Diagnosis not present

## 2014-09-06 DIAGNOSIS — G4733 Obstructive sleep apnea (adult) (pediatric): Secondary | ICD-10-CM | POA: Diagnosis not present

## 2014-09-06 DIAGNOSIS — G4731 Primary central sleep apnea: Secondary | ICD-10-CM | POA: Diagnosis not present

## 2014-09-08 ENCOUNTER — Encounter: Payer: Self-pay | Admitting: Adult Health

## 2014-09-08 ENCOUNTER — Ambulatory Visit (INDEPENDENT_AMBULATORY_CARE_PROVIDER_SITE_OTHER): Payer: Medicare Other | Admitting: Adult Health

## 2014-09-08 VITALS — BP 126/68 | HR 65 | Ht 66.0 in | Wt 231.0 lb

## 2014-09-08 DIAGNOSIS — I639 Cerebral infarction, unspecified: Secondary | ICD-10-CM | POA: Diagnosis not present

## 2014-09-08 DIAGNOSIS — I2510 Atherosclerotic heart disease of native coronary artery without angina pectoris: Secondary | ICD-10-CM | POA: Diagnosis not present

## 2014-09-08 NOTE — Patient Instructions (Signed)
Your physician wants you to follow-up in: 6 months with Kathryn Lawrence, NP. You will receive a reminder letter in the mail two months in advance. If you don't receive a letter, please call our office to schedule the follow-up appointment.  Your physician recommends that you continue on your current medications as directed. Please refer to the Current Medication list given to you today.  Thank you for choosing [facility]!   

## 2014-09-08 NOTE — Progress Notes (Deleted)
Name: [name]    DOB: 08-18-1945  Age: 69 y.o.  MR#: [id number]       PCP:  Rosita Fire, MD      Insurance: Payor: [employer] MEDICARE / Plan: [employer] MEDICARE / Product Type: *No Product type* /   CC:    Chief Complaint  Patient presents with  . Coronary Artery Disease  . Cerebrovascular Accident    VS Filed Vitals:   09/08/14 1447  BP: 126/68  Pulse: 65  Height: 5\' 6"  (1.676 m)  Weight: 231 lb (104.781 kg)  SpO2: 95%    Weights Current Weight  09/08/14 231 lb (104.781 kg)  07/07/13 221 lb (100.245 kg)  07/05/13 220 lb (99.791 kg)    Blood Pressure  BP Readings from Last 3 Encounters:  09/08/14 126/68  07/07/13 142/69  07/05/13 141/68     Admit date:  (Not on file) Last encounter with RMR:  08/14/2014   Allergy Lisinopril  Current Outpatient Prescriptions  Medication Sig Dispense Refill  . clopidogrel (PLAVIX) 75 MG tablet TAKE 1 TABLET (75 MG TOTAL) BY MOUTH DAILY WITH BREAKFAST. 30 tablet 11  . nitroGLYCERIN (NITROSTAT) 0.4 MG SL tablet Place 1 tablet (0.4 mg total) under the tongue every 5 (five) minutes as needed for chest pain. up to 3 doses. 25 tablet 4  . pravastatin (PRAVACHOL) 20 MG tablet TAKE 1 TABLET BY MOUTH EVERY EVENING 90 tablet 3  . metoprolol succinate (TOPROL-XL) 50 MG 24 hr tablet TAKE 1 TABLET (50 MG TOTAL) BY MOUTH DAILY. TAKE WITH OR IMMEDIATELY FOLLOWING A MEAL. 30 tablet 11   No current facility-administered medications for this visit.    Discontinued Meds:    Medications Discontinued During This Encounter  Medication Reason  . clopidogrel (PLAVIX) 75 MG tablet Error  . traMADol (ULTRAM) 50 MG tablet Error    Patient Active Problem List   Diagnosis Date Noted  . 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 Chronic anticoagulation-discontinued 10/19/2012  . CVA (cerebral infarction) 06/09/2012  . Arteriosclerotic cardiovascular disease (ASCVD)   . Pulmonary embolism 03/07/2010  . HYPERLIPIDEMIA 11/24/2008  . HYPERTENSION  11/24/2008    LABS    Component Value Date/Time   NA 141 07/07/2013 2016   NA 136 06/10/2012 2057   NA 133* 06/06/2012 1150   K 3.9 07/07/2013 2016   K 3.8 06/10/2012 2057   K 4.1 06/06/2012 1150   CL 103 07/07/2013 2016   CL 99 06/10/2012 2057   CL 100 06/06/2012 1150   CO2 31 07/07/2013 2016   CO2 28 06/10/2012 2057   CO2 26 06/06/2012 1150   GLUCOSE 109* 07/07/2013 2016   GLUCOSE 97 06/10/2012 2057   GLUCOSE 98 06/06/2012 1150   BUN 13 07/07/2013 2016   BUN 10 06/10/2012 2057   BUN 10 06/06/2012 1150   CREATININE 1.30 07/07/2013 2016   CREATININE 1.27 06/10/2012 2057   CREATININE 1.24 06/06/2012 1150   CALCIUM 9.5 07/07/2013 2016   CALCIUM 9.2 06/10/2012 2057   CALCIUM 9.3 06/06/2012 1150   GFRNONAA 55* 07/07/2013 2016   GFRNONAA 58* 06/10/2012 2057   GFRNONAA 59* 06/06/2012 1150   GFRAA 64* 07/07/2013 2016   GFRAA 67* 06/10/2012 2057   GFRAA 69* 06/06/2012 1150   CMP     Component Value Date/Time   NA 141 07/07/2013 2016   K 3.9 07/07/2013 2016   CL 103 07/07/2013 2016   CO2 31 07/07/2013 2016   GLUCOSE 109* 07/07/2013 2016   BUN 13 07/07/2013 2016  CREATININE 1.30 07/07/2013 2016   CALCIUM 9.5 07/07/2013 2016   PROT 7.0 07/05/2013 0814   ALBUMIN 4.1 07/05/2013 0814   AST 24 07/05/2013 0814   ALT 25 07/05/2013 0814   ALKPHOS 45 07/05/2013 0814   BILITOT 1.6* 07/05/2013 0814   GFRNONAA 55* 07/07/2013 2016   GFRAA 64* 07/07/2013 2016       Component Value Date/Time   WBC 5.5 07/07/2013 2016   WBC 5.0 06/06/2012 1150   WBC 6.0 03/22/2012 0105   HGB 14.9 07/07/2013 2016   HGB 16.0 06/06/2012 1150   HGB 16.3 03/22/2012 0105   HCT 45.5 07/07/2013 2016   HCT 47.8 06/06/2012 1150   HCT 48.0 03/22/2012 0105   MCV 89.6 07/07/2013 2016   MCV 88.0 06/06/2012 1150   MCV 87.8 03/22/2012 0105    Lipid Panel     Component Value Date/Time   CHOL 78 07/05/2013 0814   TRIG 82 07/05/2013 0814   HDL 39* 07/05/2013 0814   CHOLHDL 2.0 07/05/2013 0814    VLDL 16 07/05/2013 0814   LDLCALC 23 07/05/2013 0814    ABG    Component Value Date/Time   PHART 7.443 03/30/2010 1113   PCO2ART 30.6* 03/30/2010 1113   PO2ART 62.1* 03/30/2010 1113   HCO3 20.6 03/30/2010 1113   TCO2 18.5 03/30/2010 1113   ACIDBASEDEF 2.8* 03/30/2010 1113   O2SAT 90.1 03/30/2010 1113     Lab Results  Component Value Date   TSH 1.043 06/07/2012   BNP (last 3 results) No results for input(s): BNP in the last 8760 hours.  ProBNP (last 3 results) No results for input(s): PROBNP in the last 8760 hours.  Cardiac Panel (last 3 results) No results for input(s): CKTOTAL, CKMB, TROPONINI, RELINDX in the last 72 hours.  Iron/TIBC/Ferritin/ %Sat No results found for: IRON, TIBC, FERRITIN, IRONPCTSAT   EKG Orders placed or performed during the hospital encounter of 07/07/13  . ED EKG  . ED EKG  . EKG 12-Lead  . EKG 12-Lead  . EKG     Prior Assessment and Plan Problem List as of 09/08/2014      Cardiovascular and Mediastinum   HYPERTENSION   Last Assessment & Plan 07/05/2013 Office Visit Written 07/05/2013  1:23 PM by Lendon Colonel, NP    Currently controlled. I will refill metoprolol. He is advised on low sodium diet and to increase activity.      Arteriosclerotic cardiovascular disease (ASCVD)   Last Assessment & Plan 07/05/2013 Office Visit Written 07/05/2013  1:26 PM by Lendon Colonel, NP    He is currently asymptomatic. I will provide refills on NTG sublingual. He has not had to use his current supply and has the same bottle in his pocket from one year ago. I will continue risk management. Fasting lipids and LFTS will be drawn.       Pulmonary embolism   Last Assessment & Plan 07/05/2013 Office Visit Written 07/05/2013  1:24 PM by Lendon Colonel, NP    Has been taken off of coumadin by PCP, Dr. Legrand Rams. He has changed him to Plavix 75 mg daily.         Nervous and Auditory   CVA (cerebral infarction)   Last Assessment & Plan 07/05/2013 Office  Visit Written 07/05/2013  1:25 PM by Lendon Colonel, NP    He has no residual affects of weakness or neurologic deficits.        Other   HYPERLIPIDEMIA   Last Assessment &  Plan 10/19/2012 Office Visit Written 11/01/2012  9:41 PM by Yehuda Savannah, MD     Lipid profile earlier this year was excellent with current therapy, which will be continued.      Chronic anticoagulation-discontinued   Last Assessment & Plan 10/19/2012 Office Visit Written 11/01/2012  9:41 PM by Yehuda Savannah, MD    Patient is overdue for screening colonoscopy. He is advised to contact his gastroenterologist.      Central sleep apnea   Last Assessment & Plan 10/19/2012 Office Visit Written 11/01/2012  9:40 PM by Yehuda Savannah, MD    Evaluation and treatment are in progress.          Imaging: No results found.

## 2014-09-08 NOTE — Progress Notes (Signed)
Cardiology Office Note   Date:  09/08/2014   ID:  Kalai, Baca 25-Oct-1945, MRN [id number]  PCP:  Rosita Fire, MD  Cardiologist:  Est with Branch/ Jory Sims, NP   Chief Complaint  Patient presents with  . Coronary Artery Disease  . Cerebrovascular Accident      History of Present Illness: Unknown [name] is a 69 y.o. male who presents for ongoing assessment and management of CAD, with a history of CVA, and pulmonary embolism.  The patient was last seen in the office in March of 2015, and was noted also to have a history of hypertension, and obstructive sleep apnea.  The patient is on Plavix and not on Coumadin per primary care.   The patient was seen in the emergency room on 07/07/2013 with complaints of fatigue.  He has had associated nausea and leg weakness.  EKG revealed sinus bradycardia with left atrial enlargement and inferior infarct.  The patient is not found to be anemic.  He was continued on current medication regimen, and asked to follow-up with cardiology and primary care physician.    He states that he has gained some wt and has not been exercising like he had been in the past, but has started back again. He denies chest pain, dizziness, or fatigue. He enjoys walking outside and is happy to get back to it. He is medically compliant. He has not cardiac complaints.   Past Medical History  Diagnosis Date  . Arteriosclerotic cardiovascular disease (ASCVD) 1998    Inferior MI->PCI of the RCA in 1998; acute anterior MI in 06/2002->  PCI of RCA and LAD with DES x2 to RCA, residual 80% ostial D1, and 80% mid CX and EF-40%; CABG-2011, LIMA-LAD, SVG to D1, OM1 & OM2; EF of 35-40% in 07/2010  . Substance abuse     formerly cocaine  . Hyperlipidemia   . Pulmonary embolism 03/2010  . Chronic anticoagulation   . Keloid     median sternotomy    Past Surgical History  Procedure Laterality Date  . Ptca  06/1996    LAD & RCA  . Coronary artery bypass graft  03/18/2010   LIMA-LAD, SVG to diagonal, OM1 & OM2  . Tee without cardioversion N/A 06/10/2012    Procedure: TRANSESOPHAGEAL ECHOCARDIOGRAM (TEE);  Surgeon: Josue Hector, MD;  Location: [facility];  Service: Cardiovascular;  Laterality: N/A;     Current Outpatient Prescriptions  Medication Sig Dispense Refill  . clopidogrel (PLAVIX) 75 MG tablet TAKE 1 TABLET (75 MG TOTAL) BY MOUTH DAILY WITH BREAKFAST. 30 tablet 11  . nitroGLYCERIN (NITROSTAT) 0.4 MG SL tablet Place 1 tablet (0.4 mg total) under the tongue every 5 (five) minutes as needed for chest pain. up to 3 doses. 25 tablet 4  . pravastatin (PRAVACHOL) 20 MG tablet TAKE 1 TABLET BY MOUTH EVERY EVENING 90 tablet 3  . metoprolol succinate (TOPROL-XL) 50 MG 24 hr tablet TAKE 1 TABLET (50 MG TOTAL) BY MOUTH DAILY. TAKE WITH OR IMMEDIATELY FOLLOWING A MEAL. 30 tablet 11   No current facility-administered medications for this visit.    Allergies:   Lisinopril    Social History:  The patient  reports that he has quit smoking. He does not have any smokeless tobacco history on file. He reports that he does not drink alcohol or use illicit drugs.   Family History:  The patient's family history includes Cancer (age of onset: 51) in his mother.    ROS: .  All other systems are reviewed and negative.Unless otherwise mentioned in H&P above.  PHYSICAL EXAM: VS:  BP 126/68 mmHg  Pulse 65  Ht 5\' 6"  (1.676 m)  Wt 231 lb (104.781 kg)  BMI 37.30 kg/m2  SpO2 95% , BMI Body mass index is 37.3 kg/(m^2). GEN: Well nourished, well developed, in no acute distress HEENT: normal Neck: no JVD, carotid bruits, or masses Cardiac: RRR; no murmurs, rubs, or gallops,no edema  Respiratory:  Clear to auscultation bilaterally, normal work of breathing GI: soft, nontender, nondistended, + BS MS: no deformity or atrophy Skin: warm and dry, no rash Neuro:  Strength and sensation are intact Psych: euthymic mood, full affect  Recent Labs: No results found for  requested labs within last 365 days.    Lipid Panel    Component Value Date/Time   CHOL 78 07/05/2013 0814   TRIG 82 07/05/2013 0814   HDL 39* 07/05/2013 0814   CHOLHDL 2.0 07/05/2013 0814   VLDL 16 07/05/2013 0814   LDLCALC 23 07/05/2013 0814      Wt Readings from Last 3 Encounters:  09/08/14 231 lb (104.781 kg)  07/07/13 221 lb (100.245 kg)  07/05/13 220 lb (99.791 kg)      Other studies Reviewed: Additional studies/ records that were reviewed today include: None Review of the above records demonstrates: N/A   ASSESSMENT AND PLAN:  1. CAD: Continue BB and statin. He is encouraged to continue exercise program he has restarted but warned about walking in extreme heat for an extended period of time. He verbalized understanding. Will see him in 6 months.   2. Hx of CVA: Followed by neurology. He will continue on Plavix.    Current medicines are reviewed at length with the patient today.    Labs/ tests ordered today include: None No orders of the defined types were placed in this encounter.     Disposition:   FU with  6 months  Signed, Jory Sims, NP  09/08/2014 2:55 PM    [facility] [address] [place], [place] [address] Phone: [phone]; Fax: [phone]

## 2014-09-13 DIAGNOSIS — G4731 Primary central sleep apnea: Secondary | ICD-10-CM | POA: Diagnosis not present

## 2014-09-28 DIAGNOSIS — I10 Essential (primary) hypertension: Secondary | ICD-10-CM | POA: Diagnosis not present

## 2014-09-28 DIAGNOSIS — E784 Other hyperlipidemia: Secondary | ICD-10-CM | POA: Diagnosis not present

## 2014-10-28 DIAGNOSIS — E784 Other hyperlipidemia: Secondary | ICD-10-CM | POA: Diagnosis not present

## 2014-10-28 DIAGNOSIS — I10 Essential (primary) hypertension: Secondary | ICD-10-CM | POA: Diagnosis not present

## 2014-11-28 DIAGNOSIS — E784 Other hyperlipidemia: Secondary | ICD-10-CM | POA: Diagnosis not present

## 2014-11-28 DIAGNOSIS — I10 Essential (primary) hypertension: Secondary | ICD-10-CM | POA: Diagnosis not present

## 2014-12-22 ENCOUNTER — Telehealth: Payer: Self-pay | Admitting: *Deleted

## 2014-12-22 NOTE — Telephone Encounter (Signed)
Request for Independent Assessment for Personal Care Services Form completed and available for pick up at front desk.

## 2015-01-10 DIAGNOSIS — I10 Essential (primary) hypertension: Secondary | ICD-10-CM | POA: Diagnosis not present

## 2015-01-10 DIAGNOSIS — G4731 Primary central sleep apnea: Secondary | ICD-10-CM | POA: Diagnosis not present

## 2015-01-10 DIAGNOSIS — I639 Cerebral infarction, unspecified: Secondary | ICD-10-CM | POA: Diagnosis not present

## 2015-01-11 ENCOUNTER — Other Ambulatory Visit: Payer: Self-pay

## 2015-01-11 MED ORDER — CLOPIDOGREL BISULFATE 75 MG PO TABS: ORAL_TABLET | ORAL | Status: DC

## 2015-01-11 NOTE — Telephone Encounter (Signed)
REFILL COMPLETE  

## 2015-01-22 DIAGNOSIS — Z23 Encounter for immunization: Secondary | ICD-10-CM | POA: Diagnosis not present

## 2015-01-22 DIAGNOSIS — G4733 Obstructive sleep apnea (adult) (pediatric): Secondary | ICD-10-CM | POA: Diagnosis not present

## 2015-01-22 DIAGNOSIS — I10 Essential (primary) hypertension: Secondary | ICD-10-CM | POA: Diagnosis not present

## 2015-01-22 DIAGNOSIS — E669 Obesity, unspecified: Secondary | ICD-10-CM | POA: Diagnosis not present

## 2015-01-22 DIAGNOSIS — I2510 Atherosclerotic heart disease of native coronary artery without angina pectoris: Secondary | ICD-10-CM | POA: Diagnosis not present

## 2015-02-22 DIAGNOSIS — I10 Essential (primary) hypertension: Secondary | ICD-10-CM | POA: Diagnosis not present

## 2015-02-22 DIAGNOSIS — I2510 Atherosclerotic heart disease of native coronary artery without angina pectoris: Secondary | ICD-10-CM | POA: Diagnosis not present

## 2015-02-22 DIAGNOSIS — G4733 Obstructive sleep apnea (adult) (pediatric): Secondary | ICD-10-CM | POA: Diagnosis not present

## 2015-02-28 ENCOUNTER — Encounter: Payer: Self-pay | Admitting: Cardiovascular Disease

## 2015-02-28 ENCOUNTER — Ambulatory Visit (INDEPENDENT_AMBULATORY_CARE_PROVIDER_SITE_OTHER): Payer: Medicare Other | Admitting: Cardiovascular Disease

## 2015-02-28 VITALS — BP 122/72 | HR 73 | Ht 66.0 in | Wt 222.6 lb

## 2015-02-28 DIAGNOSIS — I638 Other cerebral infarction: Secondary | ICD-10-CM

## 2015-02-28 DIAGNOSIS — E785 Hyperlipidemia, unspecified: Secondary | ICD-10-CM | POA: Diagnosis not present

## 2015-02-28 DIAGNOSIS — I10 Essential (primary) hypertension: Secondary | ICD-10-CM | POA: Diagnosis not present

## 2015-02-28 DIAGNOSIS — I6389 Other cerebral infarction: Secondary | ICD-10-CM

## 2015-02-28 DIAGNOSIS — I25812 Atherosclerosis of bypass graft of coronary artery of transplanted heart without angina pectoris: Secondary | ICD-10-CM | POA: Diagnosis not present

## 2015-02-28 NOTE — Patient Instructions (Signed)
Your physician wants you to follow-up in:  1 year with Dr Koneswaran You will receive a reminder letter in the mail two months in advance. If you don't receive a letter, please call our office to schedule the follow-up appointment.    Your physician recommends that you continue on your current medications as directed. Please refer to the Current Medication list given to you today.    If you need a refill on your cardiac medications before your next appointment, please call your pharmacy.     Thank you for choosing [facility] !        

## 2015-02-28 NOTE — Progress Notes (Signed)
Patient ID: [name], male   DOB: 1945/11/10, 69 y.o.   MRN: [id number]      SUBJECTIVE: The patient is a 69 yr old male with CAD s/p acute anterior MI in 2004, PCI of LAD and RCA, CABG in 2011, hyperlipidemia, CVA, and pulmonary embolism.  He is feeling well and denies chest pain, leg swelling, palpitations, and shortness of breath. He has not had to take nitroglycerin in the past several months.  ECG performed in the office today demonstrates sinus rhythm with old anterior, lateral, and inferior infarct with diffuse nonspecific ST-T abnormalities.   Review of Systems: As per "subjective", otherwise negative.  Allergies  Allergen Reactions  . Lisinopril     Mouth and tongue swells    Current Outpatient Prescriptions  Medication Sig Dispense Refill  . clopidogrel (PLAVIX) 75 MG tablet TAKE 1 TABLET (75 MG TOTAL) BY MOUTH DAILY WITH BREAKFAST. 30 tablet 11  . metoprolol succinate (TOPROL-XL) 50 MG 24 hr tablet TAKE 1 TABLET (50 MG TOTAL) BY MOUTH DAILY. TAKE WITH OR IMMEDIATELY FOLLOWING A MEAL. 30 tablet 11  . nitroGLYCERIN (NITROSTAT) 0.4 MG SL tablet Place 1 tablet (0.4 mg total) under the tongue every 5 (five) minutes as needed for chest pain. up to 3 doses. 25 tablet 4  . pravastatin (PRAVACHOL) 20 MG tablet TAKE 1 TABLET BY MOUTH EVERY EVENING 90 tablet 3   No current facility-administered medications for this visit.    Past Medical History  Diagnosis Date  . Arteriosclerotic cardiovascular disease (ASCVD) 1998    Inferior MI->PCI of the RCA in 1998; acute anterior MI in 06/2002->  PCI of RCA and LAD with DES x2 to RCA, residual 80% ostial D1, and 80% mid CX and EF-40%; CABG-2011, LIMA-LAD, SVG to D1, OM1 & OM2; EF of 35-40% in 07/2010  . Substance abuse     formerly cocaine  . Hyperlipidemia   . Pulmonary embolism ([place]) 03/2010  . Chronic anticoagulation   . Keloid     median sternotomy    Past Surgical History  Procedure Laterality Date  . Ptca  06/1996    LAD  & RCA  . 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 Tee without cardioversion N/A 06/10/2012    Procedure: TRANSESOPHAGEAL ECHOCARDIOGRAM (TEE);  Surgeon: Josue Hector, MD;  Location: [facility];  Service: Cardiovascular;  Laterality: N/A;    Social History   Social History  . Marital Status: Widowed    Spouse Name: N/A  . Number of Children: N/A  . Years of Education: N/A   Occupational History  . Not on file.   Social History Main Topics  . Smoking status: Former Research scientist (life sciences)  . Smokeless tobacco: Not on file  . Alcohol Use: No  . Drug Use: No     Comment: Former cocaine abuse  . Sexual Activity: Not on file   Other Topics Concern  . Not on file   Social History Narrative     Filed Vitals:   02/28/15 0906  BP: 122/72  Pulse: 73  Height: 5\' 6"  (1.676 m)  Weight: 222 lb 9.6 oz (100.971 kg)  SpO2: 97%    PHYSICAL EXAM General: NAD HEENT: Normal. Neck: No JVD, no thyromegaly. Lungs: Clear to auscultation bilaterally with normal respiratory effort. CV: Nondisplaced PMI.  Regular rate and rhythm, normal S1/S2, no S3/S4, no murmur. No pretibial or periankle edema.  No carotid bruit.    Abdomen:  Soft, nontender, obese, no distention.  Neurologic: Alert and oriented x 3.  Psych: Normal affect. Skin: Normal. Musculoskeletal: Normal range of motion, no gross deformities. Extremities: No clubbing or cyanosis.   ECG: Most recent ECG reviewed.      ASSESSMENT AND PLAN: 1. CAD with CABG: Symptomatically stable. Continue metoprolol, Plavix, and statin.  2. Hyperlipidemia: Obtain copy of lipid panel. Continue statin.  3. H/o CVA: Continue Plavix.  4. Essential HTN: Well controlled. No changes.  Dispo: f/u 1 year.  Kate Sable, M.D., F.A.C.C.

## 2015-03-24 DIAGNOSIS — I10 Essential (primary) hypertension: Secondary | ICD-10-CM | POA: Diagnosis not present

## 2015-03-24 DIAGNOSIS — I2510 Atherosclerotic heart disease of native coronary artery without angina pectoris: Secondary | ICD-10-CM | POA: Diagnosis not present

## 2015-03-24 DIAGNOSIS — I499 Cardiac arrhythmia, unspecified: Secondary | ICD-10-CM | POA: Diagnosis not present

## 2015-04-24 DIAGNOSIS — I10 Essential (primary) hypertension: Secondary | ICD-10-CM | POA: Diagnosis not present

## 2015-04-24 DIAGNOSIS — G4733 Obstructive sleep apnea (adult) (pediatric): Secondary | ICD-10-CM | POA: Diagnosis not present

## 2015-05-03 DIAGNOSIS — G4733 Obstructive sleep apnea (adult) (pediatric): Secondary | ICD-10-CM | POA: Diagnosis not present

## 2015-05-25 DIAGNOSIS — I10 Essential (primary) hypertension: Secondary | ICD-10-CM | POA: Diagnosis not present

## 2015-05-25 DIAGNOSIS — I2510 Atherosclerotic heart disease of native coronary artery without angina pectoris: Secondary | ICD-10-CM | POA: Diagnosis not present

## 2015-07-24 DIAGNOSIS — I2510 Atherosclerotic heart disease of native coronary artery without angina pectoris: Secondary | ICD-10-CM | POA: Diagnosis not present

## 2015-07-24 DIAGNOSIS — G4733 Obstructive sleep apnea (adult) (pediatric): Secondary | ICD-10-CM | POA: Diagnosis not present

## 2015-07-24 DIAGNOSIS — I499 Cardiac arrhythmia, unspecified: Secondary | ICD-10-CM | POA: Diagnosis not present

## 2015-07-24 DIAGNOSIS — I10 Essential (primary) hypertension: Secondary | ICD-10-CM | POA: Diagnosis not present

## 2015-08-23 DIAGNOSIS — I2510 Atherosclerotic heart disease of native coronary artery without angina pectoris: Secondary | ICD-10-CM | POA: Diagnosis not present

## 2015-08-23 DIAGNOSIS — I10 Essential (primary) hypertension: Secondary | ICD-10-CM | POA: Diagnosis not present

## 2015-08-27 DIAGNOSIS — I6930 Unspecified sequelae of cerebral infarction: Secondary | ICD-10-CM | POA: Diagnosis not present

## 2015-08-27 DIAGNOSIS — G4731 Primary central sleep apnea: Secondary | ICD-10-CM | POA: Diagnosis not present

## 2015-08-27 DIAGNOSIS — I10 Essential (primary) hypertension: Secondary | ICD-10-CM | POA: Diagnosis not present

## 2015-08-27 DIAGNOSIS — E785 Hyperlipidemia, unspecified: Secondary | ICD-10-CM | POA: Diagnosis not present

## 2015-09-19 DIAGNOSIS — I10 Essential (primary) hypertension: Secondary | ICD-10-CM | POA: Diagnosis not present

## 2015-09-19 DIAGNOSIS — I2510 Atherosclerotic heart disease of native coronary artery without angina pectoris: Secondary | ICD-10-CM | POA: Diagnosis not present

## 2015-10-23 DIAGNOSIS — I2510 Atherosclerotic heart disease of native coronary artery without angina pectoris: Secondary | ICD-10-CM | POA: Diagnosis not present

## 2015-10-23 DIAGNOSIS — I10 Essential (primary) hypertension: Secondary | ICD-10-CM | POA: Diagnosis not present

## 2015-11-23 DIAGNOSIS — I10 Essential (primary) hypertension: Secondary | ICD-10-CM | POA: Diagnosis not present

## 2015-11-23 DIAGNOSIS — I6350 Cerebral infarction due to unspecified occlusion or stenosis of unspecified cerebral artery: Secondary | ICD-10-CM | POA: Diagnosis not present

## 2016-06-27 DIAGNOSIS — I10 Essential (primary) hypertension: Secondary | ICD-10-CM | POA: Diagnosis not present

## 2016-06-27 DIAGNOSIS — I2510 Atherosclerotic heart disease of native coronary artery without angina pectoris: Secondary | ICD-10-CM | POA: Diagnosis not present

## 2016-07-23 DIAGNOSIS — Z0100 Encounter for examination of eyes and vision without abnormal findings: Secondary | ICD-10-CM | POA: Diagnosis not present

## 2016-07-23 DIAGNOSIS — H5200 Hypermetropia, unspecified eye: Secondary | ICD-10-CM | POA: Diagnosis not present

## 2016-07-30 DIAGNOSIS — I2510 Atherosclerotic heart disease of native coronary artery without angina pectoris: Secondary | ICD-10-CM | POA: Diagnosis not present

## 2016-07-30 DIAGNOSIS — E669 Obesity, unspecified: Secondary | ICD-10-CM | POA: Diagnosis not present

## 2016-07-30 DIAGNOSIS — I10 Essential (primary) hypertension: Secondary | ICD-10-CM | POA: Diagnosis not present

## 2016-07-30 DIAGNOSIS — G4733 Obstructive sleep apnea (adult) (pediatric): Secondary | ICD-10-CM | POA: Diagnosis not present

## 2016-07-30 DIAGNOSIS — I6350 Cerebral infarction due to unspecified occlusion or stenosis of unspecified cerebral artery: Secondary | ICD-10-CM | POA: Diagnosis not present

## 2016-07-30 DIAGNOSIS — Z0000 Encounter for general adult medical examination without abnormal findings: Secondary | ICD-10-CM | POA: Diagnosis not present

## 2016-07-30 DIAGNOSIS — Z1389 Encounter for screening for other disorder: Secondary | ICD-10-CM | POA: Diagnosis not present

## 2016-07-30 DIAGNOSIS — E784 Other hyperlipidemia: Secondary | ICD-10-CM | POA: Diagnosis not present

## 2016-07-30 DIAGNOSIS — R739 Hyperglycemia, unspecified: Secondary | ICD-10-CM | POA: Diagnosis not present

## 2016-07-30 DIAGNOSIS — I499 Cardiac arrhythmia, unspecified: Secondary | ICD-10-CM | POA: Diagnosis not present

## 2016-08-01 ENCOUNTER — Encounter: Payer: Self-pay | Admitting: Cardiovascular Disease

## 2016-08-01 ENCOUNTER — Ambulatory Visit (INDEPENDENT_AMBULATORY_CARE_PROVIDER_SITE_OTHER): Payer: Medicare HMO | Admitting: Cardiovascular Disease

## 2016-08-01 VITALS — BP 112/64 | HR 57 | Ht 66.0 in | Wt 225.0 lb

## 2016-08-01 DIAGNOSIS — G4733 Obstructive sleep apnea (adult) (pediatric): Secondary | ICD-10-CM

## 2016-08-01 DIAGNOSIS — I10 Essential (primary) hypertension: Secondary | ICD-10-CM

## 2016-08-01 DIAGNOSIS — Z8673 Personal history of transient ischemic attack (TIA), and cerebral infarction without residual deficits: Secondary | ICD-10-CM | POA: Diagnosis not present

## 2016-08-01 DIAGNOSIS — E782 Mixed hyperlipidemia: Secondary | ICD-10-CM

## 2016-08-01 DIAGNOSIS — I25768 Atherosclerosis of bypass graft of coronary artery of transplanted heart with other forms of angina pectoris: Secondary | ICD-10-CM

## 2016-08-01 NOTE — Progress Notes (Signed)
SUBJECTIVE: The patient presents for routine follow-up. He has a history of CAD s/p acute anterior MI in 2004, PCI of LAD and RCA, CABG in 2011, hyperlipidemia, CVA, and pulmonary embolism.  I ordered and personally interpreted an ECG performed in the office today which demonstrated sinus bradycardia, 53 bpm, with old inferior and anterior infarct and nonspecific ST segment and T-wave abnormalities.  He denies chest pain, palpitations, lightheadedness, dizziness, leg swelling, and syncope. He has some mild exertional dyspnea. He walks at [facility] and does several laps without difficulty.  He has sleep apnea but has not used CPAP in over a year. He plans to order new equipment in the near future.   Review of Systems: As per "subjective", otherwise negative.  Allergies  Allergen Reactions  . Lisinopril     Mouth and tongue swells    Current Outpatient Prescriptions  Medication Sig Dispense Refill  . clopidogrel (PLAVIX) 75 MG tablet TAKE 1 TABLET (75 MG TOTAL) BY MOUTH DAILY WITH BREAKFAST. 30 tablet 11  . metoprolol succinate (TOPROL-XL) 50 MG 24 hr tablet TAKE 1 TABLET (50 MG TOTAL) BY MOUTH DAILY. TAKE WITH OR IMMEDIATELY FOLLOWING A MEAL. 30 tablet 11  . nitroGLYCERIN (NITROSTAT) 0.4 MG SL tablet Place 1 tablet (0.4 mg total) under the tongue every 5 (five) minutes as needed for chest pain. up to 3 doses. 25 tablet 4  . pravastatin (PRAVACHOL) 20 MG tablet TAKE 1 TABLET BY MOUTH EVERY EVENING 90 tablet 3   No current facility-administered medications for this visit.     Past Medical History:  Diagnosis Date  . Arteriosclerotic cardiovascular disease (ASCVD) 1998   Inferior MI->PCI of the RCA in 1998; acute anterior MI in 06/2002->  PCI of RCA and LAD with DES x2 to RCA, residual 80% ostial D1, and 80% mid CX and EF-40%; CABG-2011, LIMA-LAD, SVG to D1, OM1 & OM2; EF of 35-40% in 07/2010  . Chronic anticoagulation   . Hyperlipidemia   . Keloid    median sternotomy  .  Pulmonary embolism ([place]) 03/2010  . Substance abuse    formerly cocaine    Past Surgical History:  Procedure Laterality Date  . CORONARY ARTERY BYPASS GRAFT  03/18/2010   LIMA-LAD, SVG to diagonal, OM1 & OM2  . PTCA  06/1996   LAD & RCA  . TEE WITHOUT CARDIOVERSION N/A 06/10/2012   Procedure: TRANSESOPHAGEAL ECHOCARDIOGRAM (TEE);  Surgeon: Josue Hector, MD;  Location: [facility];  Service: Cardiovascular;  Laterality: N/A;    Social History   Social History  . Marital status: Widowed    Spouse name: N/A  . Number of children: N/A  . Years of education: N/A   Occupational History  . Not on file.   Social History Main Topics  . Smoking status: Former Research scientist (life sciences)  . Smokeless tobacco: Not on file  . Alcohol use No  . Drug use: No     Comment: Former cocaine abuse  . Sexual activity: Not on file   Other Topics Concern  . Not on file   Social History Narrative  . No narrative on file     Vitals:   08/01/16 1548  BP: 112/64  Pulse: (!) 57  SpO2: 96%  Weight: 225 lb (102.1 kg)  Height: 5\' 6"  (1.676 m)    Wt Readings from Last 3 Encounters:  08/01/16 225 lb (102.1 kg)  02/28/15 222 lb 9.6 oz (101 kg)  09/08/14 231 lb (104.8 kg)  PHYSICAL EXAM General: NAD HEENT: Normal. Neck: No JVD, no thyromegaly. Lungs: Clear to auscultation bilaterally with normal respiratory effort. CV: Nondisplaced PMI.  Regular rate and rhythm, normal S1/S2, no S3/S4, no murmur. No pretibial or periankle edema.  No carotid bruit.   Abdomen: Soft, nontender, obese. Neurologic: Alert and oriented.  Psych: Normal affect. Skin: Normal. Musculoskeletal: No gross deformities.    ECG: Most recent ECG reviewed.   Labs: Lab Results  Component Value Date/Time   K 3.9 07/07/2013 08:16 PM   BUN 13 07/07/2013 08:16 PM   CREATININE 1.30 07/07/2013 08:16 PM   ALT 25 07/05/2013 08:14 AM   TSH 1.043 06/07/2012 09:58 AM   HGB 14.9 07/07/2013 08:16 PM     Lipids: Lab Results    Component Value Date/Time   LDLCALC 23 07/05/2013 08:14 AM   CHOL 78 07/05/2013 08:14 AM   TRIG 82 07/05/2013 08:14 AM   HDL 39 (L) 07/05/2013 08:14 AM       ASSESSMENT AND PLAN: 1. CAD with CABG: Stable ischemic heart disease. Continue metoprolol, Plavix, and statin.  2. Hyperlipidemia: I will obtain a copy of most recent lipids. Continue statin therapy.  3. H/o CVA: Continue Plavix. Blood pressure is controlled.  4. Essential HTN: Controlled. No changes to therapy.  5. OSA: Strongly advised to start using CPAP.    Disposition: Follow up 1 yr  Kate Sable, M.D., F.A.C.C.

## 2016-08-01 NOTE — Patient Instructions (Signed)
Your physician wants you to follow-up in: 1 year You will receive a reminder letter in the mail two months in advance. If you don't receive a letter, please call our office to schedule the follow-up appointment.   Your physician recommends that you continue on your current medications as directed. Please refer to the Current Medication list given to you today.    If you need a refill on your cardiac medications before your next appointment, please call your pharmacy.      Thank you for choosing [facility] !        

## 2016-08-11 ENCOUNTER — Telehealth: Payer: Self-pay

## 2016-08-11 NOTE — Telephone Encounter (Signed)
Pt was referred by Dr. Legrand Rams for his next colonoscopy. Letter was sent and pt responded. However, he is not due until 11/2016 and he is not having any problems and no family hx of colon cancer. So he will call back in July to schedule for August to ensure no problems with insurance. He will call before then if he has problems.

## 2016-08-12 NOTE — Telephone Encounter (Signed)
REVIEWED-NO ADDITIONAL RECOMMENDATIONS. 

## 2016-08-25 DIAGNOSIS — I10 Essential (primary) hypertension: Secondary | ICD-10-CM | POA: Diagnosis not present

## 2016-08-25 DIAGNOSIS — I6930 Unspecified sequelae of cerebral infarction: Secondary | ICD-10-CM | POA: Diagnosis not present

## 2016-08-25 DIAGNOSIS — E785 Hyperlipidemia, unspecified: Secondary | ICD-10-CM | POA: Diagnosis not present

## 2016-08-25 DIAGNOSIS — G4731 Primary central sleep apnea: Secondary | ICD-10-CM | POA: Diagnosis not present

## 2016-08-29 DIAGNOSIS — I10 Essential (primary) hypertension: Secondary | ICD-10-CM | POA: Diagnosis not present

## 2016-08-29 DIAGNOSIS — I2510 Atherosclerotic heart disease of native coronary artery without angina pectoris: Secondary | ICD-10-CM | POA: Diagnosis not present

## 2016-09-12 ENCOUNTER — Emergency Department (HOSPITAL_COMMUNITY)
Admission: EM | Admit: 2016-09-12 | Discharge: 2016-09-12 | Disposition: A | Discharge status: Home / Self Care | Payer: Medicare HMO | Attending: Emergency Medicine | Admitting: Emergency Medicine

## 2016-09-12 ENCOUNTER — Encounter (HOSPITAL_COMMUNITY): Payer: Self-pay | Admitting: Emergency Medicine

## 2016-09-12 DIAGNOSIS — Z87891 Personal history of nicotine dependence: Secondary | ICD-10-CM | POA: Diagnosis not present

## 2016-09-12 DIAGNOSIS — W19XXXA Unspecified fall, initial encounter: Secondary | ICD-10-CM

## 2016-09-12 DIAGNOSIS — Y939 Activity, unspecified: Secondary | ICD-10-CM | POA: Diagnosis not present

## 2016-09-12 DIAGNOSIS — S30810A Abrasion of lower back and pelvis, initial encounter: Secondary | ICD-10-CM | POA: Diagnosis not present

## 2016-09-12 DIAGNOSIS — Y92008 Other place in unspecified non-institutional (private) residence as the place of occurrence of the external cause: Secondary | ICD-10-CM | POA: Diagnosis not present

## 2016-09-12 DIAGNOSIS — I252 Old myocardial infarction: Secondary | ICD-10-CM | POA: Insufficient documentation

## 2016-09-12 DIAGNOSIS — Y999 Unspecified external cause status: Secondary | ICD-10-CM | POA: Insufficient documentation

## 2016-09-12 DIAGNOSIS — Z8673 Personal history of transient ischemic attack (TIA), and cerebral infarction without residual deficits: Secondary | ICD-10-CM | POA: Insufficient documentation

## 2016-09-12 DIAGNOSIS — Z79899 Other long term (current) drug therapy: Secondary | ICD-10-CM | POA: Diagnosis not present

## 2016-09-12 DIAGNOSIS — I2510 Atherosclerotic heart disease of native coronary artery without angina pectoris: Secondary | ICD-10-CM | POA: Insufficient documentation

## 2016-09-12 DIAGNOSIS — I10 Essential (primary) hypertension: Secondary | ICD-10-CM | POA: Insufficient documentation

## 2016-09-12 DIAGNOSIS — T148XXA Other injury of unspecified body region, initial encounter: Secondary | ICD-10-CM

## 2016-09-12 DIAGNOSIS — Z7902 Long term (current) use of antithrombotics/antiplatelets: Secondary | ICD-10-CM | POA: Diagnosis not present

## 2016-09-12 DIAGNOSIS — W1839XA Other fall on same level, initial encounter: Secondary | ICD-10-CM | POA: Insufficient documentation

## 2016-09-12 DIAGNOSIS — Z951 Presence of aortocoronary bypass graft: Secondary | ICD-10-CM | POA: Insufficient documentation

## 2016-09-12 MED ORDER — TETANUS-DIPHTH-ACELL PERTUSSIS 5-2.5-18.5 LF-MCG/0.5 IM SUSP
0.5000 mL | Freq: Once | INTRAMUSCULAR | Status: AC
  Administered 2016-09-12: 0.5 mL via INTRAMUSCULAR
  Filled 2016-09-12: qty 0.5

## 2016-09-12 NOTE — ED Triage Notes (Signed)
Pt states he fell earlier on the porch and wants to be checked out. Pt denies any pain.

## 2016-09-12 NOTE — ED Provider Notes (Signed)
[facility] DEPT Provider Note   CSN: [id number] Arrival date & time: 09/12/16  0206     History   Chief Complaint Chief Complaint  Patient presents with  . Fall    HPI [name] is a 71 y.o. male.  Patient states he was sitting on the porch earlier on a railing which collapsed under his weight and he fell part way down the railing onto his back and buttock. He was caught by family member and did not fall all the way. Denies hitting his head or losing consciousness. Denies any neck or back pain. The railing scratched his buttock and he is concerned about that. He was also told to come to the hospital anytime he falls due to his heart history. He denies any chest pain or shortness of breath. He is no longer on anticoagulation but does take Plavix. Denies any abdominal pain, back or neck pain. No focal weakness, numbness or tingling.   The history is provided by the patient.  Fall  Pertinent negatives include no chest pain, no abdominal pain, no headaches and no shortness of breath.    Past Medical History:  Diagnosis Date  . Arteriosclerotic cardiovascular disease (ASCVD) 1998   Inferior MI->PCI of the RCA in 1998; acute anterior MI in 06/2002->  PCI of RCA and LAD with DES x2 to RCA, residual 80% ostial D1, and 80% mid CX and EF-40%; CABG-2011, LIMA-LAD, SVG to D1, OM1 & OM2; EF of 35-40% in 07/2010  . Chronic anticoagulation   . Hyperlipidemia   . Keloid    median sternotomy  . Pulmonary embolism ([place]) 03/2010  . Substance abuse    formerly cocaine    Patient Active Problem List   Diagnosis Date Noted  . 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 Chronic anticoagulation-discontinued 10/19/2012  . CVA (cerebral infarction) 06/09/2012  . Arteriosclerotic cardiovascular disease (ASCVD)   . Pulmonary embolism ([place]) 03/07/2010  . HYPERLIPIDEMIA 11/24/2008  . HYPERTENSION 11/24/2008    Past Surgical History:  Procedure Laterality Date  . CORONARY ARTERY BYPASS GRAFT   03/18/2010   LIMA-LAD, SVG to diagonal, OM1 & OM2  . PTCA  06/1996   LAD & RCA  . TEE WITHOUT CARDIOVERSION N/A 06/10/2012   Procedure: TRANSESOPHAGEAL ECHOCARDIOGRAM (TEE);  Surgeon: Josue Hector, MD;  Location: [facility];  Service: Cardiovascular;  Laterality: N/A;       Home Medications    Prior to Admission medications   Medication Sig Start Date End Date Taking? Authorizing Provider  clopidogrel (PLAVIX) 75 MG tablet TAKE 1 TABLET (75 MG TOTAL) BY MOUTH DAILY WITH BREAKFAST. 01/11/15   Lendon Colonel, NP  metoprolol succinate (TOPROL-XL) 50 MG 24 hr tablet TAKE 1 TABLET (50 MG TOTAL) BY MOUTH DAILY. TAKE WITH OR IMMEDIATELY FOLLOWING A MEAL. 07/03/14   Lendon Colonel, NP  nitroGLYCERIN (NITROSTAT) 0.4 MG SL tablet Place 1 tablet (0.4 mg total) under the tongue every 5 (five) minutes as needed for chest pain. up to 3 doses. 07/05/13   Lendon Colonel, NP  pravastatin (PRAVACHOL) 20 MG tablet TAKE 1 TABLET BY MOUTH EVERY EVENING 06/09/14   Lendon Colonel, NP    Family History Family History  Problem Relation Age of Onset  . Cancer Mother 35    Social History Social History  Substance Use Topics  . Smoking status: Former Research scientist (life sciences)  . Smokeless tobacco: Never Used  . Alcohol use No     Allergies   Lisinopril   Review  of Systems Review of Systems  Constitutional: Negative for activity change, appetite change and fever.  HENT: Negative for congestion and rhinorrhea.   Eyes: Negative for visual disturbance.  Respiratory: Negative for cough, chest tightness and shortness of breath.   Cardiovascular: Negative for chest pain.  Gastrointestinal: Negative for abdominal pain, nausea and vomiting.  Genitourinary: Negative for dysuria.  Musculoskeletal: Negative for arthralgias and myalgias.  Skin: Positive for wound.  Neurological: Negative for dizziness, weakness, light-headedness and headaches.   all other systems are negative except as noted in the HPI and  PMH.     Physical Exam Updated Vital Signs BP (!) 161/78   Pulse 67   Temp 97.5 F (36.4 C)   Resp (!) 21   Ht 5\' 6"  (1.676 m)   Wt 101.6 kg (224 lb)   SpO2 96%   BMI 36.15 kg/m   Physical Exam  Constitutional: He is oriented to person, place, and time. He appears well-developed and well-nourished. No distress.  HENT:  Head: Normocephalic and atraumatic.  Mouth/Throat: Oropharynx is clear and moist. No oropharyngeal exudate.  Eyes: Conjunctivae and EOM are normal. Pupils are equal, round, and reactive to light.  Neck: Normal range of motion. Neck supple.  No C spine tenderness  Cardiovascular: Normal rate, regular rhythm, normal heart sounds and intact distal pulses.   No murmur heard. Pulmonary/Chest: Effort normal and breath sounds normal. No respiratory distress.  Abdominal: Soft. There is no tenderness. There is no rebound and no guarding.  Musculoskeletal: Normal range of motion. He exhibits no edema or tenderness.  5/5 strength in bilateral lower extremities. Ankle plantar and dorsiflexion intact. Great toe extension intact bilaterally. +2 DP and PT pulses. +2 patellar reflexes bilaterally. Normal gait.  Small abrasion to L buttock  Neurological: He is alert and oriented to person, place, and time. No cranial nerve deficit. He exhibits normal muscle tone. Coordination normal.   5/5 strength throughout. CN 2-12 intact.Equal grip strength.   Skin: Skin is warm.  Psychiatric: He has a normal mood and affect. His behavior is normal.  Nursing note and vitals reviewed.    ED Treatments / Results  Labs (all labs ordered are listed, but only abnormal results are displayed) Labs Reviewed - No data to display  EKG  EKG Interpretation None       Radiology No results found.  Procedures Procedures (including critical care time)  Medications Ordered in ED Medications  Tdap (BOOSTRIX) injection 0.5 mL (not administered)     Initial Impression / Assessment and  Plan / ED Course  I have reviewed the triage vital signs and the nursing notes.  Pertinent labs & imaging results that were available during my care of the patient were reviewed by me and considered in my medical decision making (see chart for details).    Fall with abrasion to buttock. Denies head injury. No neck or back pain.  EKG is unchanged. Nonspecific T wave changes anteriorly. Inferior Q waves unchanged  Patient denies any dizziness or lightheadedness. No head, neck, back injury. No chest pain or shortness of breath.  Tetanus is updated. EKG is unchanged. Patient is tolerating by mouth and ambulatory. He states he was just worried and wanted to get checked out due to the scratch on his buttock. Return precautions discussed. Follow-up with PCP.   Final Clinical Impressions(s) / ED Diagnoses   Final diagnoses:  Fall, initial encounter  Abrasion    New Prescriptions New Prescriptions   No medications on file  Ezequiel Essex, MD 09/12/16 [phone]

## 2016-09-12 NOTE — Discharge Instructions (Signed)
Follow up with your doctor. Return to the ED if you develop chest pain, shortness of breath, or any other concerns.

## 2016-09-29 DIAGNOSIS — I10 Essential (primary) hypertension: Secondary | ICD-10-CM | POA: Diagnosis not present

## 2016-09-29 DIAGNOSIS — I2510 Atherosclerotic heart disease of native coronary artery without angina pectoris: Secondary | ICD-10-CM | POA: Diagnosis not present

## 2016-09-30 ENCOUNTER — Telehealth: Payer: Self-pay | Admitting: Gastroenterology

## 2016-09-30 NOTE — Telephone Encounter (Signed)
Letter mailed to pt.  

## 2016-09-30 NOTE — Telephone Encounter (Signed)
Aug recall for tcs °

## 2016-10-16 DIAGNOSIS — I10 Essential (primary) hypertension: Secondary | ICD-10-CM | POA: Diagnosis not present

## 2016-10-16 DIAGNOSIS — I499 Cardiac arrhythmia, unspecified: Secondary | ICD-10-CM | POA: Diagnosis not present

## 2016-10-16 DIAGNOSIS — I2510 Atherosclerotic heart disease of native coronary artery without angina pectoris: Secondary | ICD-10-CM | POA: Diagnosis not present

## 2016-10-31 DIAGNOSIS — G4733 Obstructive sleep apnea (adult) (pediatric): Secondary | ICD-10-CM | POA: Diagnosis not present

## 2016-10-31 DIAGNOSIS — I2510 Atherosclerotic heart disease of native coronary artery without angina pectoris: Secondary | ICD-10-CM | POA: Diagnosis not present

## 2016-10-31 DIAGNOSIS — I10 Essential (primary) hypertension: Secondary | ICD-10-CM | POA: Diagnosis not present

## 2016-11-03 DIAGNOSIS — G4733 Obstructive sleep apnea (adult) (pediatric): Secondary | ICD-10-CM | POA: Diagnosis not present

## 2016-11-03 DIAGNOSIS — I2510 Atherosclerotic heart disease of native coronary artery without angina pectoris: Secondary | ICD-10-CM | POA: Diagnosis not present

## 2016-11-03 DIAGNOSIS — I10 Essential (primary) hypertension: Secondary | ICD-10-CM | POA: Diagnosis not present

## 2016-11-05 DIAGNOSIS — I10 Essential (primary) hypertension: Secondary | ICD-10-CM | POA: Diagnosis not present

## 2016-11-05 DIAGNOSIS — I2510 Atherosclerotic heart disease of native coronary artery without angina pectoris: Secondary | ICD-10-CM | POA: Diagnosis not present

## 2016-11-05 DIAGNOSIS — G4733 Obstructive sleep apnea (adult) (pediatric): Secondary | ICD-10-CM | POA: Diagnosis not present

## 2016-11-10 DIAGNOSIS — I2510 Atherosclerotic heart disease of native coronary artery without angina pectoris: Secondary | ICD-10-CM | POA: Diagnosis not present

## 2016-11-10 DIAGNOSIS — G4733 Obstructive sleep apnea (adult) (pediatric): Secondary | ICD-10-CM | POA: Diagnosis not present

## 2016-11-10 DIAGNOSIS — I10 Essential (primary) hypertension: Secondary | ICD-10-CM | POA: Diagnosis not present

## 2016-11-13 ENCOUNTER — Telehealth: Payer: Self-pay

## 2016-11-13 NOTE — Telephone Encounter (Signed)
[phone]  Patient received letter to schedule tcs

## 2016-11-13 NOTE — Telephone Encounter (Signed)
Gastroenterology Pre-Procedure Review  Request Date: Requesting Physician: [name]  PATIENT REVIEW QUESTIONS: The patient responded to the following health history questions as indicated:    1. Diabetes Melitis: NO 2. Joint replacements in the past 12 months: NO 3. Major health problems in the past 3 months: NO 4. Has an artificial valve or MVP: NO 5. Has a defibrillator: NO 6. Has been advised in past to take antibiotics in advance of a procedure like teeth cleaning: NO 7. Family history of colon cancer: NO 8. Alcohol Use: NO 9. History of sleep apnea: YES 10. History of coronary artery or other vascular stents placed within the last 12 months: NO 11. History of any prior anesthesia complications: NO    MEDICATIONS & ALLERGIES:    Patient reports the following regarding taking any blood thinners:   Plavix? YES Aspirin? NO Coumadin? NO Brilinta? NO Xarelto? NO Eliquis? NO Pradaxa? NO Savaysa? NO Effient? NO  Patient confirms/reports the following medications:  Current Outpatient Prescriptions  Medication Sig Dispense Refill  . clopidogrel (PLAVIX) 75 MG tablet TAKE 1 TABLET (75 MG TOTAL) BY MOUTH DAILY WITH BREAKFAST. 30 tablet 11  . metoprolol succinate (TOPROL-XL) 50 MG 24 hr tablet TAKE 1 TABLET (50 MG TOTAL) BY MOUTH DAILY. TAKE WITH OR IMMEDIATELY FOLLOWING A MEAL. 30 tablet 11  . nitroGLYCERIN (NITROSTAT) 0.4 MG SL tablet Place 1 tablet (0.4 mg total) under the tongue every 5 (five) minutes as needed for chest pain. up to 3 doses. 25 tablet 4  . pravastatin (PRAVACHOL) 20 MG tablet TAKE 1 TABLET BY MOUTH EVERY EVENING 90 tablet 3   No current facility-administered medications for this visit.     Patient confirms/reports the following allergies:  Allergies  Allergen Reactions  . Lisinopril     Mouth and tongue swells    No orders of the defined types were placed in this encounter.   AUTHORIZATION INFORMATION Primary Insurance: [place] ,  [place] #:  [id number],   Group #:  Pre-Cert / Josem Kaufmann required:  Pre-Cert / Auth #:   Secondary Insurance: MEDICAID,  ID #: [id number],  Group #:  Pre-Cert / Auth required:  Pre-Cert / Auth #:   SCHEDULE INFORMATION: Procedure has been scheduled as follows:  Date: , Time:   Location:   This Gastroenterology Pre-Precedure Review Form is being routed to the following provider(s): Barney Drain, MD

## 2016-11-14 NOTE — Telephone Encounter (Signed)
Appropriate. Patient on plavix.

## 2016-11-17 DIAGNOSIS — G4733 Obstructive sleep apnea (adult) (pediatric): Secondary | ICD-10-CM | POA: Diagnosis not present

## 2016-11-17 DIAGNOSIS — I2510 Atherosclerotic heart disease of native coronary artery without angina pectoris: Secondary | ICD-10-CM | POA: Diagnosis not present

## 2016-11-17 DIAGNOSIS — I10 Essential (primary) hypertension: Secondary | ICD-10-CM | POA: Diagnosis not present

## 2016-11-18 ENCOUNTER — Other Ambulatory Visit: Payer: Self-pay

## 2016-11-18 DIAGNOSIS — Z1211 Encounter for screening for malignant neoplasm of colon: Secondary | ICD-10-CM

## 2016-11-18 MED ORDER — PEG 3350-KCL-NA BICARB-NACL 420 G PO SOLR: 4000.0000 mL | ORAL | 0 refills | Status: DC

## 2016-11-18 NOTE — Telephone Encounter (Signed)
Pt is set up for TCS on 12/19/16 @ 2:00 pm. He is aware and instructions are in the mail.

## 2016-11-18 NOTE — Telephone Encounter (Signed)
NO PA is needed for TCS 

## 2016-11-18 NOTE — Telephone Encounter (Signed)
LMOM to call back

## 2016-11-24 DIAGNOSIS — I2510 Atherosclerotic heart disease of native coronary artery without angina pectoris: Secondary | ICD-10-CM | POA: Diagnosis not present

## 2016-11-24 DIAGNOSIS — G4733 Obstructive sleep apnea (adult) (pediatric): Secondary | ICD-10-CM | POA: Diagnosis not present

## 2016-11-24 DIAGNOSIS — I10 Essential (primary) hypertension: Secondary | ICD-10-CM | POA: Diagnosis not present

## 2016-12-01 DIAGNOSIS — I10 Essential (primary) hypertension: Secondary | ICD-10-CM | POA: Diagnosis not present

## 2016-12-01 DIAGNOSIS — G4733 Obstructive sleep apnea (adult) (pediatric): Secondary | ICD-10-CM | POA: Diagnosis not present

## 2016-12-01 DIAGNOSIS — I2510 Atherosclerotic heart disease of native coronary artery without angina pectoris: Secondary | ICD-10-CM | POA: Diagnosis not present

## 2016-12-06 DIAGNOSIS — I2510 Atherosclerotic heart disease of native coronary artery without angina pectoris: Secondary | ICD-10-CM | POA: Diagnosis not present

## 2016-12-06 DIAGNOSIS — I10 Essential (primary) hypertension: Secondary | ICD-10-CM | POA: Diagnosis not present

## 2016-12-06 DIAGNOSIS — G4733 Obstructive sleep apnea (adult) (pediatric): Secondary | ICD-10-CM | POA: Diagnosis not present

## 2016-12-08 DIAGNOSIS — I10 Essential (primary) hypertension: Secondary | ICD-10-CM | POA: Diagnosis not present

## 2016-12-08 DIAGNOSIS — G4733 Obstructive sleep apnea (adult) (pediatric): Secondary | ICD-10-CM | POA: Diagnosis not present

## 2016-12-08 DIAGNOSIS — I2510 Atherosclerotic heart disease of native coronary artery without angina pectoris: Secondary | ICD-10-CM | POA: Diagnosis not present

## 2016-12-15 DIAGNOSIS — I2510 Atherosclerotic heart disease of native coronary artery without angina pectoris: Secondary | ICD-10-CM | POA: Diagnosis not present

## 2016-12-15 DIAGNOSIS — I10 Essential (primary) hypertension: Secondary | ICD-10-CM | POA: Diagnosis not present

## 2016-12-15 DIAGNOSIS — G4733 Obstructive sleep apnea (adult) (pediatric): Secondary | ICD-10-CM | POA: Diagnosis not present

## 2016-12-22 DIAGNOSIS — I2510 Atherosclerotic heart disease of native coronary artery without angina pectoris: Secondary | ICD-10-CM | POA: Diagnosis not present

## 2016-12-22 DIAGNOSIS — I10 Essential (primary) hypertension: Secondary | ICD-10-CM | POA: Diagnosis not present

## 2016-12-22 DIAGNOSIS — G4733 Obstructive sleep apnea (adult) (pediatric): Secondary | ICD-10-CM | POA: Diagnosis not present

## 2016-12-29 DIAGNOSIS — I2510 Atherosclerotic heart disease of native coronary artery without angina pectoris: Secondary | ICD-10-CM | POA: Diagnosis not present

## 2016-12-29 DIAGNOSIS — I10 Essential (primary) hypertension: Secondary | ICD-10-CM | POA: Diagnosis not present

## 2016-12-29 DIAGNOSIS — G4733 Obstructive sleep apnea (adult) (pediatric): Secondary | ICD-10-CM | POA: Diagnosis not present

## 2017-01-05 DIAGNOSIS — I10 Essential (primary) hypertension: Secondary | ICD-10-CM | POA: Diagnosis not present

## 2017-01-05 DIAGNOSIS — G4733 Obstructive sleep apnea (adult) (pediatric): Secondary | ICD-10-CM | POA: Diagnosis not present

## 2017-01-05 DIAGNOSIS — I2510 Atherosclerotic heart disease of native coronary artery without angina pectoris: Secondary | ICD-10-CM | POA: Diagnosis not present

## 2017-01-12 DIAGNOSIS — I2510 Atherosclerotic heart disease of native coronary artery without angina pectoris: Secondary | ICD-10-CM | POA: Diagnosis not present

## 2017-01-12 DIAGNOSIS — G4733 Obstructive sleep apnea (adult) (pediatric): Secondary | ICD-10-CM | POA: Diagnosis not present

## 2017-01-12 DIAGNOSIS — I10 Essential (primary) hypertension: Secondary | ICD-10-CM | POA: Diagnosis not present

## 2017-01-19 DIAGNOSIS — I2510 Atherosclerotic heart disease of native coronary artery without angina pectoris: Secondary | ICD-10-CM | POA: Diagnosis not present

## 2017-01-19 DIAGNOSIS — I10 Essential (primary) hypertension: Secondary | ICD-10-CM | POA: Diagnosis not present

## 2017-01-19 DIAGNOSIS — G4733 Obstructive sleep apnea (adult) (pediatric): Secondary | ICD-10-CM | POA: Diagnosis not present

## 2017-01-22 ENCOUNTER — Encounter (HOSPITAL_COMMUNITY): Payer: Self-pay | Admitting: Gastroenterology

## 2017-01-22 ENCOUNTER — Encounter (HOSPITAL_COMMUNITY)
Admission: RE | Disposition: A | Discharge status: Home / Self Care | Payer: Self-pay | Source: Ambulatory Visit | Attending: Gastroenterology

## 2017-01-22 ENCOUNTER — Ambulatory Visit (HOSPITAL_COMMUNITY)
Admission: RE | Admit: 2017-01-22 | Discharge: 2017-01-22 | Disposition: A | Discharge status: Home / Self Care | Payer: Medicare HMO | Source: Ambulatory Visit | Attending: Gastroenterology | Admitting: Gastroenterology

## 2017-01-22 DIAGNOSIS — Z955 Presence of coronary angioplasty implant and graft: Secondary | ICD-10-CM | POA: Insufficient documentation

## 2017-01-22 DIAGNOSIS — Z1212 Encounter for screening for malignant neoplasm of rectum: Secondary | ICD-10-CM

## 2017-01-22 DIAGNOSIS — Z87891 Personal history of nicotine dependence: Secondary | ICD-10-CM | POA: Insufficient documentation

## 2017-01-22 DIAGNOSIS — D125 Benign neoplasm of sigmoid colon: Secondary | ICD-10-CM | POA: Insufficient documentation

## 2017-01-22 DIAGNOSIS — Z1211 Encounter for screening for malignant neoplasm of colon: Secondary | ICD-10-CM | POA: Diagnosis not present

## 2017-01-22 DIAGNOSIS — Z86711 Personal history of pulmonary embolism: Secondary | ICD-10-CM | POA: Diagnosis not present

## 2017-01-22 DIAGNOSIS — E785 Hyperlipidemia, unspecified: Secondary | ICD-10-CM | POA: Diagnosis not present

## 2017-01-22 DIAGNOSIS — I2510 Atherosclerotic heart disease of native coronary artery without angina pectoris: Secondary | ICD-10-CM | POA: Diagnosis not present

## 2017-01-22 DIAGNOSIS — D123 Benign neoplasm of transverse colon: Secondary | ICD-10-CM | POA: Diagnosis not present

## 2017-01-22 DIAGNOSIS — Z5181 Encounter for therapeutic drug level monitoring: Secondary | ICD-10-CM

## 2017-01-22 DIAGNOSIS — Z79899 Other long term (current) drug therapy: Secondary | ICD-10-CM | POA: Diagnosis not present

## 2017-01-22 HISTORY — PX: POLYPECTOMY: SHX5525

## 2017-01-22 HISTORY — PX: COLONOSCOPY: SHX5424

## 2017-01-22 SURGERY — COLONOSCOPY
Anesthesia: Moderate Sedation

## 2017-01-22 MED ORDER — MIDAZOLAM HCL 5 MG/5ML IJ SOLN
INTRAMUSCULAR | Status: DC | PRN
Start: 2017-01-22 — End: 2017-01-22
  Administered 2017-01-22 (×2): 2 mg via INTRAVENOUS

## 2017-01-22 MED ORDER — MEPERIDINE HCL 100 MG/ML IJ SOLN
INTRAMUSCULAR | Status: AC
  Filled 2017-01-22: qty 2

## 2017-01-22 MED ORDER — MEPERIDINE HCL 100 MG/ML IJ SOLN
INTRAMUSCULAR | Status: DC | PRN
  Administered 2017-01-22: 50 mg via INTRAVENOUS
  Administered 2017-01-22: 25 mg via INTRAVENOUS

## 2017-01-22 MED ORDER — STERILE WATER FOR IRRIGATION IR SOLN
Status: DC | PRN
  Administered 2017-01-22: 2.5 mL

## 2017-01-22 MED ORDER — MIDAZOLAM HCL 5 MG/5ML IJ SOLN
INTRAMUSCULAR | Status: AC
  Filled 2017-01-22: qty 10

## 2017-01-22 MED ORDER — SODIUM CHLORIDE 0.9 % IV SOLN
INTRAVENOUS | Status: DC
  Administered 2017-01-22: 13:00:00 via INTRAVENOUS

## 2017-01-22 NOTE — Discharge Instructions (Signed)
You had 3 polyps removed. You have  SMALL internal hemorrhoids.   DRINK WATER TO KEEP YOUR URINE LIGHT YELLOW.  FOLLOW A HIGH FIBER DIET. AVOID ITEMS THAT CAUSE BLOATING & GAS. SEE INFO BELOW.  YOUR BIOPSY RESULTS WILL BE AVAILABLE IN MY CHART AFTER OCT 22 AND MY OFFICE WILL CONTACT YOU IN 10-14 DAYS WITH YOUR RESULTS.   Next colonoscopy in 3 years.    Colonoscopy Care After Read the instructions outlined below and refer to this sheet in the next week. These discharge instructions provide you with general information on caring for yourself after you leave the hospital. While your treatment has been planned according to the most current medical practices available, unavoidable complications occasionally occur. If you have any problems or questions after discharge, call DR. Yetta Marceaux, [phone].  ACTIVITY  You may resume your regular activity, but move at a slower pace for the next 24 hours.   Take frequent rest periods for the next 24 hours.   Walking will help get rid of the air and reduce the bloated feeling in your belly (abdomen).   No driving for 24 hours (because of the medicine (anesthesia) used during the test).   You may shower.   Do not sign any important legal documents or operate any machinery for 24 hours (because of the anesthesia used during the test).    NUTRITION  Drink plenty of fluids.   You may resume your normal diet as instructed by your doctor.   Begin with a light meal and progress to your normal diet. Heavy or fried foods are harder to digest and may make you feel sick to your stomach (nauseated).   Avoid alcoholic beverages for 24 hours or as instructed.    MEDICATIONS  You may resume your normal medications.   WHAT YOU CAN EXPECT TODAY  Some feelings of bloating in the abdomen.   Passage of more gas than usual.   Spotting of blood in your stool or on the toilet paper  .  IF YOU HAD POLYPS REMOVED DURING THE COLONOSCOPY:  Eat a soft  diet IF YOU HAVE NAUSEA, BLOATING, ABDOMINAL PAIN, OR VOMITING.    FINDING OUT THE RESULTS OF YOUR TEST Not all test results are available during your visit. DR. Oneida Alar WILL CALL YOU WITHIN 14 DAYS OF YOUR PROCEDUE WITH YOUR RESULTS. Do not assume everything is normal if you have not heard from DR. Kanae Ignatowski, CALL HER OFFICE AT [phone].  SEEK IMMEDIATE MEDICAL ATTENTION AND CALL THE OFFICE: [phone] IF:  You have more than a spotting of blood in your stool.   Your belly is swollen (abdominal distention).   You are nauseated or vomiting.   You have a temperature over 101F.   You have abdominal pain or discomfort that is severe or gets worse throughout the day.   High-Fiber Diet A high-fiber diet changes your normal diet to include more whole grains, legumes, fruits, and vegetables. Changes in the diet involve replacing refined carbohydrates with unrefined foods. The calorie level of the diet is essentially unchanged. The Dietary Reference Intake (recommended amount) for adult males is 38 grams per day. For adult females, it is 25 grams per day. Pregnant and lactating women should consume 28 grams of fiber per day. Fiber is the intact part of a plant that is not broken down during digestion. Functional fiber is fiber that has been isolated from the plant to provide a beneficial effect in the body. PURPOSE  Increase stool bulk.  Ease and regulate bowel movements.   Lower cholesterol.   REDUCE RISK OF COLON CANCER  INDICATIONS THAT YOU NEED MORE FIBER  Constipation and hemorrhoids.   Uncomplicated diverticulosis (intestine condition) and irritable bowel syndrome.   Weight management.   As a protective measure against hardening of the arteries (atherosclerosis), diabetes, and cancer.   GUIDELINES FOR INCREASING FIBER IN THE DIET  Start adding fiber to the diet slowly. A gradual increase of about 5 more grams (2 slices of whole-wheat bread, 2 servings of most fruits or  vegetables, or 1 bowl of high-fiber cereal) per day is best. Too rapid an increase in fiber may result in constipation, flatulence, and bloating.   Drink enough water and fluids to keep your urine clear or pale yellow. Water, juice, or caffeine-free drinks are recommended. Not drinking enough fluid may cause constipation.   Eat a variety of high-fiber foods rather than one type of fiber.   Try to increase your intake of fiber through using high-fiber foods rather than fiber pills or supplements that contain small amounts of fiber.   The goal is to change the types of food eaten. Do not supplement your present diet with high-fiber foods, but replace foods in your present diet.   INCLUDE A VARIETY OF FIBER SOURCES  Replace refined and processed grains with whole grains, canned fruits with fresh fruits, and incorporate other fiber sources. White rice, white breads, and most bakery goods contain little or no fiber.   Brown whole-grain rice, buckwheat oats, and many fruits and vegetables are all good sources of fiber. These include: broccoli, Brussels sprouts, cabbage, cauliflower, beets, sweet potatoes, white potatoes (skin on), carrots, tomatoes, eggplant, squash, berries, fresh fruits, and dried fruits.   Cereals appear to be the richest source of fiber. Cereal fiber is found in whole grains and bran. Bran is the fiber-rich outer coat of cereal grain, which is largely removed in refining. In whole-grain cereals, the bran remains. In breakfast cereals, the largest amount of fiber is found in those with "bran" in their names. The fiber content is sometimes indicated on the label.   You may need to include additional fruits and vegetables each day.   In baking, for 1 cup white flour, you may use the following substitutions:   1 cup whole-wheat flour minus 2 tablespoons.   1/2 cup white flour plus 1/2 cup whole-wheat flour.   Polyps, Colon  A polyp is extra tissue that grows inside your body.  Colon polyps grow in the large intestine. The large intestine, also called the colon, is part of your digestive system. It is a long, hollow tube at the end of your digestive tract where your body makes and stores stool. Most polyps are not dangerous. They are benign. This means they are not cancerous. But over time, some types of polyps can turn into cancer. Polyps that are smaller than a pea are usually not harmful. But larger polyps could someday become or may already be cancerous. To be safe, doctors remove all polyps and test them.   WHO GETS POLYPS? Anyone can get polyps, but certain people are more likely than others. You may have a greater chance of getting polyps if:  You are over 50.   You have had polyps before.   Someone in your family has had polyps.   Someone in your family has had cancer of the large intestine.   Find out if someone in your family has had polyps. You may also be  more likely to get polyps if you:   Eat a lot of fatty foods   Smoke   Drink alcohol   Do not exercise  Eat too much   PREVENTION There is not one sure way to prevent polyps. You might be able to lower your risk of getting them if you:  Eat more fruits and vegetables and less fatty food.   Do not smoke.   Avoid alcohol.   Exercise every day.   Lose weight if you are overweight.   Eating more calcium and folate can also lower your risk of getting polyps. Some foods that are rich in calcium are milk, cheese, and broccoli. Some foods that are rich in folate are chickpeas, kidney beans, and spinach.   Hemorrhoids Hemorrhoids are dilated (enlarged) veins around the rectum. Sometimes clots will form in the veins. This makes them swollen and painful. These are called thrombosed hemorrhoids. Causes of hemorrhoids include:  Constipation.   Straining to have a bowel movement.   HEAVY LIFTING  HOME CARE INSTRUCTIONS  Eat a well balanced diet and drink 6 to 8 glasses of water every day to  avoid constipation. You may also use a bulk laxative.   Avoid straining to have bowel movements.   Keep anal area dry and clean.   Do not use a donut shaped pillow or sit on the toilet for long periods. This increases blood pooling and pain.   Move your bowels when your body has the urge; this will require less straining and will decrease pain and pressure.

## 2017-01-22 NOTE — Progress Notes (Signed)
Patient vital signs monitored throughout procedure and remained stable.  Monitored by Selena Lesser RN, Lurline Del RN and Dr. Barney Drain.

## 2017-01-22 NOTE — Op Note (Signed)
[facility] Patient Name: [name] Procedure Date: 01/22/2017 12:59 PM MRN: [id number] Date of Birth: May 05, 1945 Attending MD: Barney Drain MD, MD CSN: [id number] Age: 71 Admit Type: Outpatient Procedure:                Colonoscopywith COLD SNARE/SNARE CUATERY POLYPECTOMY Indications:              Screening for colorectal malignant neoplasm Providers:                Barney Drain MD, MD, Selena Lesser, Aram Candela Referring MD:             Rosita Fire MD, MD Medicines:                Meperidine 75 mg IV, Midazolam 4 mg IV Complications:            No immediate complications. Estimated Blood Loss:     Estimated blood loss was minimal. Procedure:                Pre-Anesthesia Assessment:                           - Prior to the procedure, a History and Physical                            was performed, and patient medications and                            allergies were reviewed. The patient's tolerance of                            previous anesthesia was also reviewed. The risks                            and benefits of the procedure and the sedation                            options and risks were discussed with the patient.                            All questions were answered, and informed consent                            was obtained. Prior Anticoagulants: The patient has                            taken Plavix (clopidogrel), last dose was 1 day                            prior to procedure. ASA Grade Assessment: II - A                            patient with mild systemic disease. After reviewing                            the risks and benefits, the patient was deemed in  satisfactory condition to undergo the procedure.                            After obtaining informed consent, the colonoscope                            was passed under direct vision. Throughout the                            procedure, the patient's blood pressure,  pulse, and                            oxygen saturations were monitored continuously. The                            EC-[id number] ([id number]) scope was introduced through                            the anus and advanced to the the cecum, identified                            by appendiceal orifice and ileocecal valve. The                            colonoscopy was somewhat difficult due to a                            tortuous colon. Successful completion of the                            procedure was aided by COLOWRAP. The patient                            tolerated the procedure well. The quality of the                            bowel preparation was good. The ileocecal valve,                            appendiceal orifice, and rectum were photographed. Scope In: 1:36:54 PM Scope Out: 1:56:08 PM Scope Withdrawal Time: 0 hours 18 minutes 16 seconds  Total Procedure Duration: 0 hours 19 minutes 14 seconds  Findings:      Two sessile polyps were found in the mid transverse colon. The polyps       were 2 to 3 mm in size. These polyps were removed with a cold snare.       Resection and retrieval were complete.      A 11 mm polyp was found in the sigmoid colon. The polyp was sessile. The       polyp was removed with a hot snare. Resection and retrieval were       complete. Estimated blood loss: none.      The recto-sigmoid colon was mildly redundant. Impression:               -  Two 2 to 3 mm polyps in the mid transverse colon,                            removed with a cold snare. Resected and one(3 mm)                            of two retrieved.                           - One 11 mm polyp in the sigmoid colon, removed                            with a hot snare. Resected and retrieved.                           - Redundant rectosigmoid colon Moderate Sedation:      Moderate (conscious) sedation was administered by the endoscopy nurse       and supervised by the endoscopist. The following  parameters were       monitored: oxygen saturation, heart rate, blood pressure, and response       to care. Total physician intraservice time was 31 minutes. Recommendation:           - High fiber diet.                           - Continue present medications.                           - Await pathology results.                           - Repeat colonoscopy in 3 years for surveillance.                           - Patient has a contact number available for                            emergencies. The signs and symptoms of potential                            delayed complications were discussed with the                            patient. Return to normal activities tomorrow.                            Written discharge instructions were provided to the                            patient. Procedure Code(s):        --- Professional ---                           [id number], Colonoscopy, flexible; with removal of  tumor(s), polyp(s), or other lesion(s) by snare                            technique                           99152, Moderate sedation services provided by the                            same physician or other qualified health care                            professional performing the diagnostic or                            therapeutic service that the sedation supports,                            requiring the presence of an independent trained                            observer to assist in the monitoring of the                            patient's level of consciousness and physiological                            status; initial 15 minutes of intraservice time,                            patient age 58 years or older                           [phone], Moderate sedation services; each additional                            15 minutes intraservice time Diagnosis Code(s):        --- Professional ---                           Z12.11, Encounter for screening  for malignant                            neoplasm of colon                           D12.3, Benign neoplasm of transverse colon (hepatic                            flexure or splenic flexure)                           D12.5, Benign neoplasm of sigmoid colon                           Q43.8, Other specified congenital  malformations of                            intestine CPT copyright 2016 [employer]. All rights reserved. The codes documented in this report are preliminary and upon coder review may  be revised to meet current compliance requirements. Barney Drain, MD Barney Drain MD, MD 01/22/2017 2:07:27 PM This report has been signed electronically. Number of Addenda: 0

## 2017-01-22 NOTE — H&P (Signed)
Primary Care Physician:  Rosita Fire, MD Primary Gastroenterologist:  Dr. Oneida Alar  Pre-Procedure History & Physical: HPI:  [name] is a 71 y.o. male here for [place].  Past Medical History:  Diagnosis Date  . Arteriosclerotic cardiovascular disease (ASCVD) 1998   Inferior MI->PCI of the RCA in 1998; acute anterior MI in 06/2002->  PCI of RCA and LAD with DES x2 to RCA, residual 80% ostial D1, and 80% mid CX and EF-40%; CABG-2011, LIMA-LAD, SVG to D1, OM1 & OM2; EF of 35-40% in 07/2010  . Chronic anticoagulation   . Hyperlipidemia   . Keloid    median sternotomy  . Pulmonary embolism ([place]) 03/2010  . 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 CORONARY ARTERY BYPASS GRAFT  03/18/2010   LIMA-LAD, SVG to diagonal, OM1 & OM2  . PTCA  06/1996   LAD & RCA  . TEE WITHOUT CARDIOVERSION N/A 06/10/2012   Procedure: TRANSESOPHAGEAL ECHOCARDIOGRAM (TEE);  Surgeon: Josue Hector, MD;  Location: [facility];  Service: Cardiovascular;  Laterality: N/A;    Prior to Admission medications   Medication Sig Start Date End Date Taking? Authorizing Provider  clopidogrel (PLAVIX) 75 MG tablet TAKE 1 TABLET (75 MG TOTAL) BY MOUTH DAILY WITH BREAKFAST. 01/11/15  Yes Lendon Colonel, NP  metoprolol succinate (TOPROL-XL) 50 MG 24 hr tablet TAKE 1 TABLET (50 MG TOTAL) BY MOUTH DAILY. TAKE WITH OR IMMEDIATELY FOLLOWING A MEAL. 07/03/14  Yes Lendon Colonel, NP  polyethylene glycol-electrolytes (TRILYTE) 420 g solution Take 4,000 mLs by mouth as directed. 11/18/16  Yes Nataley Bahri L, MD  pravastatin (PRAVACHOL) 20 MG tablet TAKE 1 TABLET BY MOUTH EVERY EVENING 06/09/14  Yes Lendon Colonel, NP  nitroGLYCERIN (NITROSTAT) 0.4 MG SL tablet Place 1 tablet (0.4 mg total) under the tongue every 5 (five) minutes as needed for chest pain. up to 3 doses. 07/05/13   Lendon Colonel, NP    Allergies as of 11/18/2016 - Review  Complete 11/13/2016  Allergen Reaction Noted  . Lisinopril  11/24/2008    Family History  Problem Relation Age of Onset  . Cancer Mother 65    Social History   Social History  . Marital status: Widowed    Spouse name: N/A  . Number of children: N/A  . Years of education: N/A   Occupational History  . Not on file.   Social History Main Topics  . Smoking status: Former Research scientist (life sciences)  . Smokeless tobacco: Never Used  . Alcohol use No  . Drug use: No     Comment: Former cocaine abuse  . Sexual activity: Not on file   Other Topics Concern  . Not on file   Social History Narrative  . No narrative on file    Review of Systems: See HPI, otherwise negative ROS   Physical Exam: BP (!) 159/74   Pulse 64   Temp 97.7 F (36.5 C) (Oral)   Resp 17   Ht 5\' 6"  (1.676 m)   Wt 220 lb (99.8 kg)   SpO2 99%   BMI 35.51 kg/m  General:   Alert,  pleasant and cooperative in NAD Head:  Normocephalic and atraumatic. Neck:  Supple; Lungs:  Clear throughout to auscultation.    Heart:  Regular rate and rhythm. Abdomen:  Soft, nontender and nondistended. Normal bowel sounds, without guarding, and without rebound.   Neurologic:  Alert and  oriented  x4;  grossly normal neurologically.  Impression/Plan:     SCREENING  Plan:  1. TCS TODAY DISCUSSED PROCEDURE, BENEFITS, & RISKS: < 1% chance of medication reaction, bleeding, perforation, or rupture of spleen/liver.

## 2017-01-26 ENCOUNTER — Encounter (HOSPITAL_COMMUNITY): Payer: Self-pay | Admitting: Gastroenterology

## 2017-01-26 DIAGNOSIS — I10 Essential (primary) hypertension: Secondary | ICD-10-CM | POA: Diagnosis not present

## 2017-01-26 DIAGNOSIS — G4733 Obstructive sleep apnea (adult) (pediatric): Secondary | ICD-10-CM | POA: Diagnosis not present

## 2017-01-26 DIAGNOSIS — I2510 Atherosclerotic heart disease of native coronary artery without angina pectoris: Secondary | ICD-10-CM | POA: Diagnosis not present

## 2017-01-27 DIAGNOSIS — I10 Essential (primary) hypertension: Secondary | ICD-10-CM | POA: Diagnosis not present

## 2017-01-27 DIAGNOSIS — I2510 Atherosclerotic heart disease of native coronary artery without angina pectoris: Secondary | ICD-10-CM | POA: Diagnosis not present

## 2017-01-27 DIAGNOSIS — G4733 Obstructive sleep apnea (adult) (pediatric): Secondary | ICD-10-CM | POA: Diagnosis not present

## 2017-01-27 DIAGNOSIS — Z23 Encounter for immunization: Secondary | ICD-10-CM | POA: Diagnosis not present

## 2017-02-02 DIAGNOSIS — I2510 Atherosclerotic heart disease of native coronary artery without angina pectoris: Secondary | ICD-10-CM | POA: Diagnosis not present

## 2017-02-02 DIAGNOSIS — I10 Essential (primary) hypertension: Secondary | ICD-10-CM | POA: Diagnosis not present

## 2017-02-02 DIAGNOSIS — G4733 Obstructive sleep apnea (adult) (pediatric): Secondary | ICD-10-CM | POA: Diagnosis not present

## 2017-02-05 DIAGNOSIS — G4733 Obstructive sleep apnea (adult) (pediatric): Secondary | ICD-10-CM | POA: Diagnosis not present

## 2017-02-05 DIAGNOSIS — I10 Essential (primary) hypertension: Secondary | ICD-10-CM | POA: Diagnosis not present

## 2017-02-05 DIAGNOSIS — I2510 Atherosclerotic heart disease of native coronary artery without angina pectoris: Secondary | ICD-10-CM | POA: Diagnosis not present

## 2017-02-09 DIAGNOSIS — I2510 Atherosclerotic heart disease of native coronary artery without angina pectoris: Secondary | ICD-10-CM | POA: Diagnosis not present

## 2017-02-09 DIAGNOSIS — G4733 Obstructive sleep apnea (adult) (pediatric): Secondary | ICD-10-CM | POA: Diagnosis not present

## 2017-02-09 DIAGNOSIS — I10 Essential (primary) hypertension: Secondary | ICD-10-CM | POA: Diagnosis not present

## 2017-02-11 ENCOUNTER — Telehealth: Payer: Self-pay | Admitting: Gastroenterology

## 2017-02-11 NOTE — Telephone Encounter (Signed)
Please call pt. HE had THREE simple adenomas removed. FOLLOW A HIGH FIBER DIET. NEXT TCS IN 3 YEARS.

## 2017-02-12 NOTE — Telephone Encounter (Signed)
Could not reach pt by phone, mailing a letter of the print out of results.

## 2017-02-12 NOTE — Telephone Encounter (Signed)
Reminder in epic °

## 2017-02-16 DIAGNOSIS — I2510 Atherosclerotic heart disease of native coronary artery without angina pectoris: Secondary | ICD-10-CM | POA: Diagnosis not present

## 2017-02-16 DIAGNOSIS — G4733 Obstructive sleep apnea (adult) (pediatric): Secondary | ICD-10-CM | POA: Diagnosis not present

## 2017-02-16 DIAGNOSIS — I10 Essential (primary) hypertension: Secondary | ICD-10-CM | POA: Diagnosis not present

## 2017-02-23 DIAGNOSIS — G4733 Obstructive sleep apnea (adult) (pediatric): Secondary | ICD-10-CM | POA: Diagnosis not present

## 2017-02-23 DIAGNOSIS — I2510 Atherosclerotic heart disease of native coronary artery without angina pectoris: Secondary | ICD-10-CM | POA: Diagnosis not present

## 2017-02-23 DIAGNOSIS — I10 Essential (primary) hypertension: Secondary | ICD-10-CM | POA: Diagnosis not present

## 2017-03-02 DIAGNOSIS — I10 Essential (primary) hypertension: Secondary | ICD-10-CM | POA: Diagnosis not present

## 2017-03-02 DIAGNOSIS — I2510 Atherosclerotic heart disease of native coronary artery without angina pectoris: Secondary | ICD-10-CM | POA: Diagnosis not present

## 2017-03-02 DIAGNOSIS — G4733 Obstructive sleep apnea (adult) (pediatric): Secondary | ICD-10-CM | POA: Diagnosis not present

## 2017-03-07 ENCOUNTER — Emergency Department (HOSPITAL_COMMUNITY): Payer: Medicare HMO

## 2017-03-07 ENCOUNTER — Inpatient Hospital Stay (HOSPITAL_COMMUNITY)
Admission: EM | Admit: 2017-03-07 | Discharge: 2017-03-09 | DRG: 065 | Disposition: A | Discharge status: Home Health | Payer: Medicare HMO | Attending: Internal Medicine | Admitting: Internal Medicine

## 2017-03-07 ENCOUNTER — Other Ambulatory Visit: Payer: Self-pay

## 2017-03-07 ENCOUNTER — Encounter (HOSPITAL_COMMUNITY): Payer: Self-pay | Admitting: Emergency Medicine

## 2017-03-07 DIAGNOSIS — E782 Mixed hyperlipidemia: Secondary | ICD-10-CM | POA: Diagnosis present

## 2017-03-07 DIAGNOSIS — H53461 Homonymous bilateral field defects, right side: Secondary | ICD-10-CM | POA: Diagnosis present

## 2017-03-07 DIAGNOSIS — I2510 Atherosclerotic heart disease of native coronary artery without angina pectoris: Secondary | ICD-10-CM | POA: Diagnosis present

## 2017-03-07 DIAGNOSIS — H25813 Combined forms of age-related cataract, bilateral: Secondary | ICD-10-CM | POA: Diagnosis not present

## 2017-03-07 DIAGNOSIS — Z8673 Personal history of transient ischemic attack (TIA), and cerebral infarction without residual deficits: Secondary | ICD-10-CM | POA: Diagnosis not present

## 2017-03-07 DIAGNOSIS — R008 Other abnormalities of heart beat: Secondary | ICD-10-CM | POA: Diagnosis not present

## 2017-03-07 DIAGNOSIS — Z87891 Personal history of nicotine dependence: Secondary | ICD-10-CM | POA: Diagnosis not present

## 2017-03-07 DIAGNOSIS — R739 Hyperglycemia, unspecified: Secondary | ICD-10-CM | POA: Diagnosis present

## 2017-03-07 DIAGNOSIS — I252 Old myocardial infarction: Secondary | ICD-10-CM | POA: Diagnosis not present

## 2017-03-07 DIAGNOSIS — I6330 Cerebral infarction due to thrombosis of unspecified cerebral artery: Secondary | ICD-10-CM | POA: Diagnosis not present

## 2017-03-07 DIAGNOSIS — I5030 Unspecified diastolic (congestive) heart failure: Secondary | ICD-10-CM | POA: Diagnosis not present

## 2017-03-07 DIAGNOSIS — H40003 Preglaucoma, unspecified, bilateral: Secondary | ICD-10-CM | POA: Diagnosis not present

## 2017-03-07 DIAGNOSIS — Z951 Presence of aortocoronary bypass graft: Secondary | ICD-10-CM | POA: Diagnosis not present

## 2017-03-07 DIAGNOSIS — E785 Hyperlipidemia, unspecified: Secondary | ICD-10-CM | POA: Diagnosis present

## 2017-03-07 DIAGNOSIS — I6389 Other cerebral infarction: Secondary | ICD-10-CM | POA: Diagnosis not present

## 2017-03-07 DIAGNOSIS — Z86711 Personal history of pulmonary embolism: Secondary | ICD-10-CM

## 2017-03-07 DIAGNOSIS — G459 Transient cerebral ischemic attack, unspecified: Secondary | ICD-10-CM | POA: Diagnosis present

## 2017-03-07 DIAGNOSIS — R29818 Other symptoms and signs involving the nervous system: Secondary | ICD-10-CM | POA: Diagnosis not present

## 2017-03-07 DIAGNOSIS — Z7901 Long term (current) use of anticoagulants: Secondary | ICD-10-CM

## 2017-03-07 DIAGNOSIS — I5022 Chronic systolic (congestive) heart failure: Secondary | ICD-10-CM | POA: Diagnosis present

## 2017-03-07 DIAGNOSIS — N183 Chronic kidney disease, stage 3 unspecified: Secondary | ICD-10-CM | POA: Diagnosis present

## 2017-03-07 DIAGNOSIS — R0989 Other specified symptoms and signs involving the circulatory and respiratory systems: Secondary | ICD-10-CM | POA: Diagnosis not present

## 2017-03-07 DIAGNOSIS — H53131 Sudden visual loss, right eye: Secondary | ICD-10-CM

## 2017-03-07 DIAGNOSIS — I10 Essential (primary) hypertension: Secondary | ICD-10-CM | POA: Diagnosis present

## 2017-03-07 DIAGNOSIS — I130 Hypertensive heart and chronic kidney disease with heart failure and stage 1 through stage 4 chronic kidney disease, or unspecified chronic kidney disease: Secondary | ICD-10-CM | POA: Diagnosis present

## 2017-03-07 DIAGNOSIS — Z7902 Long term (current) use of antithrombotics/antiplatelets: Secondary | ICD-10-CM | POA: Diagnosis not present

## 2017-03-07 DIAGNOSIS — Z888 Allergy status to other drugs, medicaments and biological substances status: Secondary | ICD-10-CM

## 2017-03-07 DIAGNOSIS — Z79899 Other long term (current) drug therapy: Secondary | ICD-10-CM

## 2017-03-07 DIAGNOSIS — R29701 NIHSS score 1: Secondary | ICD-10-CM | POA: Diagnosis present

## 2017-03-07 DIAGNOSIS — G4731 Primary central sleep apnea: Secondary | ICD-10-CM | POA: Diagnosis present

## 2017-03-07 DIAGNOSIS — H5461 Unqualified visual loss, right eye, normal vision left eye: Secondary | ICD-10-CM | POA: Diagnosis present

## 2017-03-07 DIAGNOSIS — H269 Unspecified cataract: Secondary | ICD-10-CM | POA: Diagnosis present

## 2017-03-07 DIAGNOSIS — H5340 Unspecified visual field defects: Secondary | ICD-10-CM | POA: Diagnosis not present

## 2017-03-07 DIAGNOSIS — D6859 Other primary thrombophilia: Secondary | ICD-10-CM | POA: Diagnosis present

## 2017-03-07 DIAGNOSIS — R748 Abnormal levels of other serum enzymes: Secondary | ICD-10-CM | POA: Diagnosis present

## 2017-03-07 DIAGNOSIS — I639 Cerebral infarction, unspecified: Secondary | ICD-10-CM | POA: Diagnosis not present

## 2017-03-07 DIAGNOSIS — I63432 Cerebral infarction due to embolism of left posterior cerebral artery: Principal | ICD-10-CM | POA: Diagnosis present

## 2017-03-07 DIAGNOSIS — G4733 Obstructive sleep apnea (adult) (pediatric): Secondary | ICD-10-CM | POA: Diagnosis present

## 2017-03-07 DIAGNOSIS — N1831 Chronic kidney disease, stage 3a: Secondary | ICD-10-CM | POA: Diagnosis present

## 2017-03-07 LAB — I-STAT CHEM 8, ED
BUN: 14 mg/dL (ref 6–20)
Calcium, Ion: 1.18 mmol/L (ref 1.15–1.40)
Chloride: 103 mmol/L (ref 101–111)
Creatinine, Ser: 1.4 mg/dL — ABNORMAL HIGH (ref 0.61–1.24)
Glucose, Bld: 110 mg/dL — ABNORMAL HIGH (ref 65–99)
HCT: 50 % (ref 39.0–52.0)
Hemoglobin: 17 g/dL (ref 13.0–17.0)
Potassium: 4.3 mmol/L (ref 3.5–5.1)
Sodium: 141 mmol/L (ref 135–145)
TCO2: 28 mmol/L (ref 22–32)

## 2017-03-07 LAB — COMPREHENSIVE METABOLIC PANEL
ALT: 21 U/L (ref 17–63)
AST: 22 U/L (ref 15–41)
Albumin: 4 g/dL (ref 3.5–5.0)
Alkaline Phosphatase: 59 U/L (ref 38–126)
Anion gap: 4 — ABNORMAL LOW (ref 5–15)
BUN: 14 mg/dL (ref 6–20)
CO2: 29 mmol/L (ref 22–32)
Calcium: 9 mg/dL (ref 8.9–10.3)
Chloride: 106 mmol/L (ref 101–111)
Creatinine, Ser: 1.46 mg/dL — ABNORMAL HIGH (ref 0.61–1.24)
GFR calc Af Amer: 54 mL/min — ABNORMAL LOW (ref >60)
GFR calc non Af Amer: 47 mL/min — ABNORMAL LOW (ref >60)
Glucose, Bld: 116 mg/dL — ABNORMAL HIGH (ref 65–99)
Potassium: 4.6 mmol/L (ref 3.5–5.1)
Sodium: 139 mmol/L (ref 135–145)
Total Bilirubin: 1.2 mg/dL (ref 0.3–1.2)
Total Protein: 7.7 g/dL (ref 6.5–8.1)

## 2017-03-07 LAB — CBC
HCT: 46.6 % (ref 39.0–52.0)
Hemoglobin: 14.9 g/dL (ref 13.0–17.0)
MCH: 28 pg (ref 26.0–34.0)
MCHC: 32 g/dL (ref 30.0–36.0)
MCV: 87.4 fL (ref 78.0–100.0)
Platelets: 142 10*3/uL — ABNORMAL LOW (ref 150–400)
RBC: 5.33 MIL/uL (ref 4.22–5.81)
RDW: 13.4 % (ref 11.5–15.5)
WBC: 7.1 10*3/uL (ref 4.0–10.5)

## 2017-03-07 LAB — RAPID URINE DRUG SCREEN, HOSP PERFORMED
Amphetamines: NOT DETECTED
Barbiturates: NOT DETECTED
Benzodiazepines: NOT DETECTED
Cocaine: NOT DETECTED
Opiates: NOT DETECTED
Tetrahydrocannabinol: NOT DETECTED

## 2017-03-07 LAB — DIFFERENTIAL
Basophils Absolute: 0 10*3/uL (ref 0.0–0.1)
Basophils Relative: 1 %
Eosinophils Absolute: 0.2 10*3/uL (ref 0.0–0.7)
Eosinophils Relative: 3 %
Lymphocytes Relative: 39 %
Lymphs Abs: 2.8 10*3/uL (ref 0.7–4.0)
Monocytes Absolute: 0.7 10*3/uL (ref 0.1–1.0)
Monocytes Relative: 9 %
Neutro Abs: 3.5 10*3/uL (ref 1.7–7.7)
Neutrophils Relative %: 48 %

## 2017-03-07 LAB — URINALYSIS, ROUTINE W REFLEX MICROSCOPIC
Bilirubin Urine: NEGATIVE
Glucose, UA: NEGATIVE mg/dL
Hgb urine dipstick: NEGATIVE
Ketones, ur: NEGATIVE mg/dL
Leukocytes, UA: NEGATIVE
Nitrite: NEGATIVE
Protein, ur: 30 mg/dL — AB
Specific Gravity, Urine: 1.032 — ABNORMAL HIGH (ref 1.005–1.030)
Squamous Epithelial / LPF: NONE SEEN
pH: 7 (ref 5.0–8.0)

## 2017-03-07 LAB — TSH: TSH: 1.37 u[IU]/mL (ref 0.350–4.500)

## 2017-03-07 LAB — ETHANOL: Alcohol, Ethyl (B): <10 mg/dL (ref <10)

## 2017-03-07 LAB — TROPONIN I
Troponin I: 0.04 ng/mL (ref <0.03)
Troponin I: 0.03 ng/mL (ref <0.03)

## 2017-03-07 LAB — PROTIME-INR
INR: 1.09
Prothrombin Time: 14 seconds (ref 11.4–15.2)

## 2017-03-07 LAB — APTT: aPTT: 35 seconds (ref 24–36)

## 2017-03-07 MED ORDER — CYCLOSPORINE 0.05 % OP EMUL
1.0000 [drp] | Freq: Every day | OPHTHALMIC | Status: DC | PRN
  Filled 2017-03-07: qty 1

## 2017-03-07 MED ORDER — ACETAMINOPHEN 325 MG PO TABS: 650.0000 mg | ORAL_TABLET | ORAL | Status: DC | PRN

## 2017-03-07 MED ORDER — CLOPIDOGREL BISULFATE 75 MG PO TABS
75.0000 mg | ORAL_TABLET | Freq: Every day | ORAL | Status: DC
  Administered 2017-03-08 – 2017-03-09 (×2): 75 mg via ORAL
  Filled 2017-03-07 (×2): qty 1

## 2017-03-07 MED ORDER — STROKE: EARLY STAGES OF RECOVERY BOOK: Freq: Once | Status: DC

## 2017-03-07 MED ORDER — IOPAMIDOL (ISOVUE-370) INJECTION 76%
80.0000 mL | Freq: Once | INTRAVENOUS | Status: AC | PRN
  Administered 2017-03-07: 80 mL via INTRAVENOUS

## 2017-03-07 MED ORDER — SODIUM CHLORIDE 0.9 % IV SOLN
INTRAVENOUS | Status: DC
  Administered 2017-03-07 – 2017-03-08 (×2): via INTRAVENOUS

## 2017-03-07 MED ORDER — ENOXAPARIN SODIUM 40 MG/0.4ML ~~LOC~~ SOLN
40.0000 mg | SUBCUTANEOUS | Status: DC
  Administered 2017-03-07: 40 mg via SUBCUTANEOUS
  Filled 2017-03-07: qty 0.4

## 2017-03-07 MED ORDER — ACETAMINOPHEN 160 MG/5ML PO SOLN: 650.0000 mg | ORAL | Status: DC | PRN

## 2017-03-07 MED ORDER — ACETAMINOPHEN 650 MG RE SUPP: 650.0000 mg | RECTAL | Status: DC | PRN

## 2017-03-07 MED ORDER — PNEUMOCOCCAL VAC POLYVALENT 25 MCG/0.5ML IJ INJ
0.5000 mL | INJECTION | INTRAMUSCULAR | Status: DC
  Filled 2017-03-07: qty 0.5

## 2017-03-07 MED ORDER — SENNOSIDES-DOCUSATE SODIUM 8.6-50 MG PO TABS: 1.0000 | ORAL_TABLET | Freq: Every evening | ORAL | Status: DC | PRN

## 2017-03-07 MED ORDER — ATORVASTATIN CALCIUM 40 MG PO TABS
40.0000 mg | ORAL_TABLET | Freq: Every day | ORAL | Status: DC
  Administered 2017-03-08 – 2017-03-09 (×2): 40 mg via ORAL
  Filled 2017-03-07 (×2): qty 1

## 2017-03-07 NOTE — ED Provider Notes (Signed)
Emergency Department Provider Note   I have reviewed the triage vital signs and the nursing notes.   HISTORY  Chief Complaint Visual Field Change   HPI [name] is a 71 y.o. male with PMH of CAD, HLD, and h/o PE for evaluation of acute right peripheral vision loss starting at 10:30 AM today.  States that he woke up with normal vision.  He was running errands with no difficulty.  At approximately 1030 he developed acute onset loss of his right peripheral vision.  He describes it as an intense blurriness.  He denies any double vision.  No speech changes.  No weakness or numbness in his arms or legs.  No gait instability.  Patient states he has been told he has had small strokes in the past.  He denies any fevers or chills.    Past Medical History:  Diagnosis Date  . Arteriosclerotic cardiovascular disease (ASCVD) 1998   Inferior MI->PCI of the RCA in 1998; acute anterior MI in 06/2002->  PCI of RCA and LAD with DES x2 to RCA, residual 80% ostial D1, and 80% mid CX and EF-40%; CABG-2011, LIMA-LAD, SVG to D1, OM1 & OM2; EF of 35-40% in 07/2010  . Chronic anticoagulation   . Hyperlipidemia   . Keloid    median sternotomy  . Pulmonary embolism ([place]) 03/2010  . 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 Special screening for malignant neoplasms, colon   . 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 Chronic anticoagulation-discontinued 10/19/2012  . CVA (cerebral infarction) 06/09/2012  . Arteriosclerotic cardiovascular disease (ASCVD)   . Pulmonary embolism ([place]) 03/07/2010  . HYPERLIPIDEMIA 11/24/2008  . HYPERTENSION 11/24/2008    Past Surgical History:  Procedure Laterality Date  . COLONOSCOPY N/A 01/22/2017   Procedure: COLONOSCOPY;  Surgeon: Danie Binder, MD;  Location[facility];  Service: Endoscopy;  Laterality: N/A;  200  . CORONARY ARTERY BYPASS GRAFT  03/18/2010   LIMA-LAD, SVG to diagonal, OM1 & OM2  .  POLYPECTOMY  01/22/2017   Procedure: POLYPECTOMY;  Surgeon: Danie Binder, MD;  Location: [facility];  Service: Endoscopy;;  Transverse(CS) and sigmoid colon(HS)  . PTCA  06/1996   LAD & RCA  . TEE WITHOUT CARDIOVERSION N/A 06/10/2012   Procedure: TRANSESOPHAGEAL ECHOCARDIOGRAM (TEE);  Surgeon: Josue Hector, MD;  Location: [facility];  Service: Cardiovascular;  Laterality: N/A;    Current Outpatient Rx  . Order #: [id number] Class: Normal  . Order #: [id number] Class: Historical Med  . Order #: [id number] Class: Historical Med  . Order #: [id number] Class: Normal  . Order #: [id number] Class: Normal  . Order #: [id number] Class: Normal    Allergies Lisinopril  Family History  Problem Relation Age of Onset  . Cancer Mother 67    Social History Social History   Tobacco Use  . Smoking status: Former Research scientist (life sciences)  . Smokeless tobacco: Never Used  Substance Use Topics  . Alcohol use: No  . Drug use: No    Comment: Former cocaine abuse    Review of Systems  Constitutional: No fever/chills Eyes: Positive right visual field deficit.  ENT: No sore throat. Cardiovascular: Denies chest pain. Respiratory: Denies shortness of breath. Gastrointestinal: No abdominal pain.  No nausea, no vomiting.  No diarrhea.  No constipation. Genitourinary: Negative for dysuria. Musculoskeletal: Negative for back pain. Skin: Negative for rash. Neurological: Negative for headaches, focal weakness or numbness.  10-point ROS  otherwise negative.  ____________________________________________   PHYSICAL EXAM:  VITAL SIGNS: ED Triage Vitals [03/07/17 1436]  Enc Vitals Group     BP (!) 158/87     Pulse Rate 68     Resp 18     Temp (!) 97.5 F (36.4 C)     Temp Source Oral     SpO2 99 %     Weight 220 lb (99.8 kg)     Height 5\' 6"  (1.676 m)   Constitutional: Alert and oriented. Well appearing and in no acute distress. Eyes: Conjunctivae are normal. PERRL. EOMI. Decreased visual field on the  patient's right.  Head: Atraumatic. Nose: No congestion/rhinnorhea. Mouth/Throat: Mucous membranes are moist. Neck: No stridor.   Cardiovascular: Normal rate, regular rhythm. Good peripheral circulation. Grossly normal heart sounds.   Respiratory: Normal respiratory effort.  No retractions. Lungs CTAB. Gastrointestinal: Soft and nontender. No distention.  Musculoskeletal: No lower extremity tenderness nor edema. No gross deformities of extremities. Neurologic:  Normal speech and language. No gross focal neurologic deficits are appreciated. Normal CN exam 2-12. No pronator drift.  Skin:  Skin is warm, dry and intact. No rash noted.  ____________________________________________   LABS (all labs ordered are listed, but only abnormal results are displayed)  Labs Reviewed  CBC - Abnormal; Notable for the following components:      Result Value   Platelets 142 (*)    All other components within normal limits  COMPREHENSIVE METABOLIC PANEL - Abnormal; Notable for the following components:   Glucose, Bld 116 (*)    Creatinine, Ser 1.46 (*)    GFR calc non Af Amer 47 (*)    GFR calc Af Amer 54 (*)    Anion gap 4 (*)    All other components within normal limits  I-STAT CHEM 8, ED - Abnormal; Notable for the following components:   Creatinine, Ser 1.40 (*)    Glucose, Bld 110 (*)    All other components within normal limits  ETHANOL  PROTIME-INR  APTT  DIFFERENTIAL  RAPID URINE DRUG SCREEN, HOSP PERFORMED  URINALYSIS, ROUTINE W REFLEX MICROSCOPIC  TROPONIN I   ____________________________________________  EKG   EKG Interpretation  Date/Time:  Saturday March 07 2017 14:42:22 EST Ventricular Rate:  61 PR Interval:    QRS Duration: 97 QT Interval:  444 QTC Calculation: 448 R Axis:   61 Text Interpretation:  Sinus rhythm Ventricular trigeminy Borderline short PR interval Inferior infarct, age indeterminate Anterolateral infarct, age indeterminate No STEMI.  Confirmed by  Nanda Quinton [phone]) on 03/07/2017 3:04:47 PM       ____________________________________________  RADIOLOGY  Ct Angio Head W Or Wo Contrast  Result Date: 03/07/2017 CLINICAL DATA:  Code stroke. Visual difficulty. RIGHT-sided weakness. EXAM: CT ANGIOGRAPHY HEAD AND NECK TECHNIQUE: Multidetector CT imaging of the head and neck was performed using the standard protocol during bolus administration of intravenous contrast. Multiplanar CT image reconstructions and MIPs were obtained to evaluate the vascular anatomy. Carotid stenosis measurements (when applicable) are obtained utilizing NASCET criteria, using the distal internal carotid diameter as the denominator. CONTRAST:  52mL ISOVUE-370 IOPAMIDOL (ISOVUE-370) INJECTION 76% COMPARISON:  Noncontrast CT head earlier.  MR head 06/07/2012. FINDINGS: CTA NECK FINDINGS Aortic arch: Standard branching. Imaged portion shows no evidence of aneurysm or dissection. No significant stenosis of the major arch vessel origins. Right carotid system: No evidence of dissection, stenosis (50% or greater) or occlusion.Minor calcific plaque at the bifurcation Left carotid system: No evidence of dissection, stenosis (50% or  greater) or occlusion. Minor calcific plaque at the bifurcation. Vertebral arteries: LEFT vertebral dominant. No evidence of dissection, stenosis, or occlusion. Minor ostial calcification on the RIGHT. Skeleton: Mild spondylosis.  No worrisome osseous lesions. Other neck: No masses. Upper chest: Emphysema. Review of the MIP images confirms the above findings CTA HEAD FINDINGS Anterior circulation: No significant stenosis, proximal occlusion, aneurysm, or vascular malformation. Posterior circulation: No significant stenosis, proximal occlusion, aneurysm, or vascular malformation. Venous sinuses: As permitted by contrast timing, patent. Anatomic variants: None. Delayed phase: No abnormal postcontrast enhancement. Review of the MIP images confirms the above  findings IMPRESSION: No intracranial or extracranial flow reducing stenosis or occlusion. No arterial dissection is observed. Emphysema (ICD10-J43.9). Electronically Signed   By: Staci Righter M.D.   On: 03/07/2017 15:41   Ct Angio Neck W Or Wo Contrast  Result Date: 03/07/2017 CLINICAL DATA:  Code stroke. Visual difficulty. RIGHT-sided weakness. EXAM: CT ANGIOGRAPHY HEAD AND NECK TECHNIQUE: Multidetector CT imaging of the head and neck was performed using the standard protocol during bolus administration of intravenous contrast. Multiplanar CT image reconstructions and MIPs were obtained to evaluate the vascular anatomy. Carotid stenosis measurements (when applicable) are obtained utilizing NASCET criteria, using the distal internal carotid diameter as the denominator. CONTRAST:  67mL ISOVUE-370 IOPAMIDOL (ISOVUE-370) INJECTION 76% COMPARISON:  Noncontrast CT head earlier.  MR head 06/07/2012. FINDINGS: CTA NECK FINDINGS Aortic arch: Standard branching. Imaged portion shows no evidence of aneurysm or dissection. No significant stenosis of the major arch vessel origins. Right carotid system: No evidence of dissection, stenosis (50% or greater) or occlusion.Minor calcific plaque at the bifurcation Left carotid system: No evidence of dissection, stenosis (50% or greater) or occlusion. Minor calcific plaque at the bifurcation. Vertebral arteries: LEFT vertebral dominant. No evidence of dissection, stenosis, or occlusion. Minor ostial calcification on the RIGHT. Skeleton: Mild spondylosis.  No worrisome osseous lesions. Other neck: No masses. Upper chest: Emphysema. Review of the MIP images confirms the above findings CTA HEAD FINDINGS Anterior circulation: No significant stenosis, proximal occlusion, aneurysm, or vascular malformation. Posterior circulation: No significant stenosis, proximal occlusion, aneurysm, or vascular malformation. Venous sinuses: As permitted by contrast timing, patent. Anatomic variants:  None. Delayed phase: No abnormal postcontrast enhancement. Review of the MIP images confirms the above findings IMPRESSION: No intracranial or extracranial flow reducing stenosis or occlusion. No arterial dissection is observed. Emphysema (ICD10-J43.9). Electronically Signed   By: Staci Righter M.D.   On: 03/07/2017 15:41   Ct Head Code Stroke Wo Contrast  Result Date: 03/07/2017 CLINICAL DATA:  Code stroke. RIGHT-sided weakness. Possible visual field defect. EXAM: CT HEAD WITHOUT CONTRAST TECHNIQUE: Contiguous axial images were obtained from the base of the skull through the vertex without intravenous contrast. COMPARISON:  CT head 06/06/2012.  MR brain 06/07/2012. FINDINGS: Brain: No evidence for acute infarction, hemorrhage, mass lesion, hydrocephalus, or extra-axial fluid. Mild to moderate cerebral and cerebellar atrophy. Multiple chronic areas of cerebral infarction are seen including LEFT frontal, RIGHT occipital, LEFT thalamus, and RIGHT cerebellum. Vascular: Calcification of the cavernous internal carotid arteries consistent with cerebrovascular atherosclerotic disease. No signs of intracranial large vessel occlusion. Skull: Normal. Negative for fracture or focal lesion. Sinuses/Orbits: No acute finding. Other: None. ASPECTS [facility] Stroke Program Early CT Score) - Ganglionic level infarction (caudate, lentiform nuclei, internal capsule, insula, M1-M3 cortex): 7 - Supraganglionic infarction (M4-M6 cortex): 3 Total score (0-10 with 10 being normal): 10 IMPRESSION: 1. Atrophy and small vessel disease. Multiple areas of chronic infarction. No acute intracranial findings are  observed. Similar appearance to priors. 2. ASPECTS is 10. These results were called by telephone at the time of interpretation on 03/07/2017 at 3:07 pm to Dr. Nanda Quinton , who verbally acknowledged these results. Electronically Signed   By: Staci Righter M.D.   On: 03/07/2017 15:15     ____________________________________________   PROCEDURES  Procedure(s) performed:   Procedures  CRITICAL CARE Performed by: Margette Fast Total critical care time: 45 minutes Critical care time was exclusive of separately billable procedures and treating other patients. Critical care was necessary to treat or prevent imminent or life-threatening deterioration. Critical care was time spent personally by me on the following activities: development of treatment plan with patient and/or surrogate as well as nursing, discussions with consultants, evaluation of patient's response to treatment, examination of patient, obtaining history from patient or surrogate, ordering and performing treatments and interventions, ordering and review of laboratory studies, ordering and review of radiographic studies, pulse oximetry and re-evaluation of patient's condition.  Nanda Quinton, MD Emergency Medicine  ____________________________________________   INITIAL IMPRESSION / ASSESSMENT AND PLAN / ED COURSE  Pertinent labs & imaging results that were available during my care of the patient were reviewed by me and considered in my medical decision making (see chart for details).  Patient presents to the emergency department for evaluation of sudden onset right peripheral vision loss.  No pain in the eye.  Normal extraocular movements.  No headache symptoms.  Patient has known vascular disease and reported history of TIA in the past.  No other focal neurological deficits.  Given his acute vision change I have concern for possible stroke.  I have activated a code stroke and plan on CT without contrast but will move for CT angio after neurology evaluation.   03:30 PM Patient evaluated by Tele-neurology Dr. Lucia Gaskins. No tPA or clot retrieval at this time. Has multiple old infarcts on CT. CTA pending.   04:00 PM Spoke with Dr. Malen Gauze at [facility] with Neurology. Plan to admit to the hosptialist service and they  will consult once at [facility]. Recommends MRI.  Discussed patient's case with Hospitalist, Dr. Lorin Mercy to request admission. Patient and family (if present) updated with plan. Care transferred to Hospitalist service.  I reviewed all nursing notes, vitals, pertinent old records, EKGs, labs, imaging (as available).  ____________________________________________  FINAL CLINICAL IMPRESSION(S) / ED DIAGNOSES  Final diagnoses:  Sudden visual loss of right eye     MEDICATIONS GIVEN DURING THIS VISIT:  Medications  iopamidol (ISOVUE-370) 76 % injection 80 mL (80 mLs Intravenous Contrast Given 03/07/17 1503)    Note:  This document was prepared using Dragon voice recognition software and may include unintentional dictation errors.  Nanda Quinton, MD Emergency Medicine    Long, W[facility], MD 03/07/17 [phone]

## 2017-03-07 NOTE — ED Notes (Signed)
IV started by RN in CT prior to scans. Labs given directly to phlebotomy.

## 2017-03-07 NOTE — Progress Notes (Signed)
CRITICAL LAB VALUE TROPONIN: 0.03 On call MD [place] Siren I, RN

## 2017-03-07 NOTE — Consult Note (Signed)
   [facility] TeleNeurology Consult Services  Impression: Possible acute ischemic stroke L PCA area with isolated homonymous hemianopsia sparing macula    Not a tPA candidate due to: outside of window of opportunity Not clearly an NIR candidate due to: no evidence by CTA of LVO  Differential Diagnosis:   1. Cardioembolic stroke  2. Small vessel disease/lacune  3. Thromboembolic, artery-to-artery mechanism  4. Hypercoagulable state-related infarct  5. Transient ischemic attack  6. Thrombotic mechanism, large artery disease    Comments:   [facility] contacted: 1450 [facility] at bedside: 1503 NIHSS assessment time: 1521  Recommendations:   Inpatient Neurology consultation; MRI Stroke evaluation per Neurology/Internal Medicine Discussed with ED MD  ----------------------------------------------------------------------------------------- MV:VKPQAE loss  History of Present Illness: 71 yr old man at 15 today had sudden loss of vision to right side. E on Plavix. H/O HBP, HLD, central sleep apnea, CAD.   Diagnostic:CT btrain old R cerebellar, L [place] and small R MCA infarcts, no acute changes.  Exam   Level of Consciousness (1): Alert, keenly responsive = 0 Level of Consciousness (2):  Ask Month and Age: Both questions right = 0 Level of Consciousness (3) (Commands): Blink Eyes/Squeeze Hands: Performs both = 0 (Pantomime if Communication Barrier) Horizontal EOMs  = 0 Visual Fields = 1 macular sparing R homonymous hemianopsia Test Facial Palsy = 0 Left Arm Motor Drift = 0 Right Arm Motor Drift = 0 Left Leg Motor Drift = 0 Right Leg Motor Drift  = 0 Limb Ataxia (F>>N>>F/H>>S) = 0 Sensation = 0 Language/Aphasia = 0 Dysarthria = 0 Extinction/Inattention = 0   NIHSS score: = 1   Medical Decision Making:  - Extensive number of diagnosis or management options are considered above.   - Extensive amount of complex data reviewed.   - High risk of  complication and/or morbidity or mortality are associated with differential diagnostic considerations above.  - There may be Uncertain outcome and increased probability of prolonged functional impairment or high probability of severe prolonged functional impairment associated with some of these differential diagnosis.   Medical Data Reviewed:  1.Data reviewed include clinical labs, radiology,  Medical Tests;   2.Tests results discussed w/performing or interpreting physician;   3.Obtaining/reviewing old medical records;  4.Obtaining case history from another source;  5.Independent review of image, tracing or specimen.    Patient was informed the Neurology Consult would happen via telehealth (remote video) and consented to receiving care in this manner.

## 2017-03-07 NOTE — ED Notes (Signed)
Report on patient given to nurse at [facility] by day shift nurse.

## 2017-03-07 NOTE — ED Notes (Signed)
TeleNeuro on camera at time of RN entering room. Pt taken to CT at this time by primary RN.

## 2017-03-07 NOTE — Consult Note (Signed)
Neurology Consultation Reason for Consult: Visual change Referring Physician: Thomasenia Bottoms  CC: Visual change  History is obtained from: Patient  HPI: [name] is a 71 y.o. male who is in his normal state of health earlier today when he had a sudden onset of visual change in the right eye.  He states that he was watching TV when he started noticing flashing lights in the right lower visual field.  Flashing lights persisted for about 15 minutes and then resolved, followed by persistent decreased vision in the right lower visual field of the right eye.  He was evaluated in the emergency department where there was concern for stroke and therefore he has been transferred to [facility] for further evaluation.  [name]: 10:30 AM tpa given?: no, outside window   ROS: A 14 point ROS was performed and is negative except as noted in the HPI.   Past Medical History:  Diagnosis Date  . Arteriosclerotic cardiovascular disease (ASCVD) 1998   Inferior MI->PCI of the RCA in 1998; acute anterior MI in 06/2002->  PCI of RCA and LAD with DES x2 to RCA, residual 80% ostial D1, and 80% mid CX and EF-40%; CABG-2011, LIMA-LAD, SVG to D1, OM1 & OM2; EF of 35-40% in 07/2010  . Chronic anticoagulation   . Hyperlipidemia   . Keloid    median sternotomy  . Pulmonary embolism ([place]) 03/2010  . 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 Cancer Mother 67     Social History:  reports that he quit smoking about 14 years ago. he has never used smokeless tobacco. He reports that he drinks alcohol. He reports that he does not use drugs.   Exam: Current vital signs: BP (!) 161/77   Pulse 77   Temp 98 F (36.7 C) (Oral)   Resp 18   Ht 5\' 6"  (1.676 m)   Wt 96.3 kg (212 lb 4.9 oz)   SpO2 97%   BMI 34.27 kg/m  Vital signs in last 24 hours: Temp:  [97.5 F (36.4 C)-98 F (36.7 C)] 98 F (36.7 C) (12/01 2100) Pulse Rate:  [56-77] 77 (12/01 2100) Resp:   [12-21] 18 (12/01 2100) BP: (155-177)/(66-96) 161/77 (12/01 2000) SpO2:  [93 %-99 %] 97 % (12/01 2101) FiO2 (%):  [0 %] 0 % (12/01 2101) Weight:  [96.3 kg (212 lb 4.9 oz)-99.8 kg (220 lb)] 96.3 kg (212 lb 4.9 oz) (12/01 2100)   Physical Exam  Constitutional: Appears well-developed and well-nourished.  Psych: Affect appropriate to situation Eyes: No scleral injection HENT: No OP obstrucion Head: Normocephalic.  Cardiovascular: Normal rate and regular rhythm.  Respiratory: Effort normal, non-labored breathing GI: Soft.  No distension. There is no tenderness.  Skin: WDI  Neuro: Mental Status: Patient is awake, alert, oriented to person, place, month, year, and situation. Patient is able to give a clear and coherent history. No signs of aphasia or neglect Cranial Nerves: II: He has decreased right lateral lower visual defect in the right eye only. Pupils are equal, round, and reactive to light.  I do not clearly see retinal detachment on my funduscopic exam, but exam is limited due to eye movement and cooperation. III,IV, VI: EOMI without ptosis or diploplia.  V: Facial sensation is symmetric to temperature VII: Facial movement is symmetric.  VIII: hearing is intact to voice X: Uvula elevates symmetrically XI: Shoulder shrug is symmetric. XII: tongue is midline without  atrophy or fasciculations.  Motor: Tone is normal. Bulk is normal. 5/5 strength was present in all four extremities.  Sensory: Sensation is symmetric to light touch and temperature in the arms and legs. Cerebellar: FNF  intact bilaterally   I have reviewed labs in epic and the results pertinent to this consultation are: CMP-creatinine 1.46  I have reviewed the images obtained: CTA-no large vessel occlusion  Impression: 71 year old male with monocular visual change.  The scintillations at onset and restricted location make me very concerned for possible retinal detachment.   Branch retinal artery occlusion but  could be another possibility, though the scintillations are less common with this.  An unusual optic radiations defect could give a smaller field cut on the left than right, and though I did not appreciate this on bedside exam, it may still be possible.  Therefore MRI brain would still be prudent.  Recommendations: 1) MRI brain 2) I have discussed with ophthalmology who will see the patient in the morning 3) Stroke workup only if possible, I have canceled the echo, ldl, a1c, therapy evals. Please re-order if MRI is positive  Roland Rack, MD [facility] [phone]  If 7pm- 7am, please page neurology on call as listed in [place].

## 2017-03-07 NOTE — ED Triage Notes (Signed)
Pt reports loss of right peripheral vision at 1130 this am. No other symptoms.

## 2017-03-07 NOTE — H&P (Signed)
History and Physical    [name] JJK:[id number] DOB: 1945-05-19 DOA: 03/07/2017  PCP: Brian Fire, MD Consultants:  [name] - neurology; [name] - cardiology; Fields - GI Patient coming from: Home - lives aslone in a trailer next door to his granddaughter; NOK: Daughter, [name], [phone]  Chief Complaint: vision loss  HPI: [name] is a 71 y.o. male with medical history significant of remote cocaine abuse; PE; HLD; HTN; CVA; OSA not on CPAP; and ASCVD s/p CABG in 2011 presenting with right-sided peripheral vision loss.  This morning he got up and went to take his daughter to a job interview.  He was waiting and he noticed the vision out of the lateral side of his right eye was blurry.  He could see straight but could not see to the side.  He told his friend and it was not getting better so he decided to come in.  It is still present now.   No dysphagia or dysarthria.  No confusion.  No headache.  No extremity symptoms.  Occasional slight cough.    ED Course:  Possible CVA, Code Stroke - teleneurology/neurology agree with transfer to [facility] for stroke evaluation.  Review of Systems: As per HPI; otherwise review of systems reviewed and negative.   Ambulatory Status:  Ambulates without assistance  Past Medical History:  Diagnosis Date  . Arteriosclerotic cardiovascular disease (ASCVD) 1998   Inferior MI->PCI of the RCA in 1998; acute anterior MI in 06/2002->  PCI of RCA and LAD with DES x2 to RCA, residual 80% ostial D1, and 80% mid CX and EF-40%; CABG-2011, LIMA-LAD, SVG to D1, OM1 & OM2; EF of 35-40% in 07/2010  . Chronic anticoagulation   . Hyperlipidemia   . Keloid    median sternotomy  . Pulmonary embolism ([place]) 03/2010  . Substance abuse [facility])    formerly cocaine    Past Surgical History:  Procedure Laterality Date  . COLONOSCOPY N/A 01/22/2017   Procedure: COLONOSCOPY;  Surgeon: Brian Binder, MD;  Location: [facility] ENDO [facility];  Service: Endoscopy;  Laterality:  N/A;  200  . CORONARY ARTERY BYPASS GRAFT  03/18/2010   LIMA-LAD, SVG to diagonal, OM1 & OM2  . POLYPECTOMY  01/22/2017   Procedure: POLYPECTOMY;  Surgeon: Brian Binder, MD;  Location: [facility]O SU[facility];  Service: Endoscopy;;  Transverse(CS) and sigmoid colon(HS)  . PTCA  06/1996   LAD & RCA  . TEE WITHOUT CARDIOVERSION N/A 06/10/2012   Procedure: TRANSESOPHAGEAL ECHOCARDIOGRAM (TEE);  Surgeon: Brian Hector, MD;  Location: [facility];  Service: Cardiovascular;  Laterality: N/A;    Social History   Socioeconomic History  . Marital status: Widowed    Spouse name: Not on file  . Number of children: Not on file  . Years of education: Not on file  . Highest education level: Not on file  Social Needs  . Financial resource strain: Not on file  . Food insecurity - worry: Not on file  . Food insecurity - inability: Not on file  . Transportation needs - medical: Not on file  . Transportation needs - non-medical: Not on file  Occupational History  . Occupation: disability  Tobacco Use  . Smoking status: Former Smoker    Last attempt to quit: 2004    Years since quitting: 14.9  . Smokeless tobacco: Never Used  Substance and Sexual Activity  . Alcohol use: Yes    Comment: rare  . Drug use: No    Comment: Former cocaine  abuse - last use for 51th birthday  . Sexual activity: Not on file  Other Topics Concern  . Not on file  Social History Narrative  . Not on file    Allergies  Allergen Reactions  . Lisinopril     Mouth and tongue swells    Family History  Problem Relation Age of Onset  . Cancer Mother 51    Prior to Admission medications   Medication Sig Start Date End Date Taking? Authorizing Provider  clopidogrel (PLAVIX) 75 MG tablet TAKE 1 TABLET (75 MG TOTAL) BY MOUTH DAILY WITH BREAKFAST. 01/11/15  Yes Lendon Colonel, NP  cycloSPORINE (RESTASIS) 0.05 % ophthalmic emulsion Place 1 drop into both eyes daily as needed.   Yes [provider]  fluticasone  (FLONASE) 50 MCG/ACT nasal spray Place 2 sprays into both nostrils 2 (two) times daily as needed for allergies or rhinitis.   Yes [provider]  metoprolol succinate (TOPROL-XL) 50 MG 24 hr tablet TAKE 1 TABLET (50 MG TOTAL) BY MOUTH DAILY. TAKE WITH OR IMMEDIATELY FOLLOWING A MEAL. 07/03/14  Yes Lendon Colonel, NP  nitroGLYCERIN (NITROSTAT) 0.4 MG SL tablet Place 1 tablet (0.4 mg total) under the tongue every 5 (five) minutes as needed for chest pain. up to 3 doses. 07/05/13  Yes Lendon Colonel, NP  pravastatin (PRAVACHOL) 20 MG tablet TAKE 1 TABLET BY MOUTH EVERY EVENING 06/09/14  Yes Lendon Colonel, NP    Physical Exam: Vitals:   03/07/17 1530 03/07/17 1600 03/07/17 1630 03/07/17 1730  BP: (!) 157/96 (!) 165/71 (!) 156/95 (!) 172/86  Pulse: 70 63 72 68  Resp: 18 15 20 19   Temp:      TempSrc:      SpO2: 98% 95% 93% 98%  Weight:      Height:         General:  Appears calm and comfortable and is NAD Eyes:  PERRL, EOMI, normal lids, iris; clear vision loss peripherally on the right lateral aspect, both upper and lower ENT:  Hard of hearing, normal lips & tongue, mmm; dentition lacking in the middle of the lower jaw Neck:  no LAD, masses or thyromegaly; no carotid bruits Cardiovascular:  RRR, no m/r/g. No LE edema.  Respiratory:   CTA bilaterally with no wheezes/rales/rhonchi.  Normal respiratory effort. Abdomen:  soft, NT, ND, NABS Back:   normal alignment, no CVAT Skin:  no rash or induration seen on limited exam Musculoskeletal:  grossly normal tone BUE/BLE, good ROM, no bony abnormality Psychiatric:  grossly normal mood and affect, speech fluent and appropriate, AOx3 Neurologic:  CN 2-12 grossly intact, moves all extremities in coordinated fashion, sensation intact    Radiological Exams on Admission: Ct Angio Head W Or Wo Contrast  Result Date: 03/07/2017 CLINICAL DATA:  Code stroke. Visual difficulty. RIGHT-sided weakness. EXAM: CT ANGIOGRAPHY HEAD AND  NECK TECHNIQUE: Multidetector CT imaging of the head and neck was performed using the standard protocol during bolus administration of intravenous contrast. Multiplanar CT image reconstructions and MIPs were obtained to evaluate the vascular anatomy. Carotid stenosis measurements (when applicable) are obtained utilizing NASCET criteria, using the distal internal carotid diameter as the denominator. CONTRAST:  14mL ISOVUE-370 IOPAMIDOL (ISOVUE-370) INJECTION 76% COMPARISON:  Noncontrast CT head earlier.  MR head 06/07/2012. FINDINGS: CTA NECK FINDINGS Aortic arch: Standard branching. Imaged portion shows no evidence of aneurysm or dissection. No significant stenosis of the major arch vessel origins. Right carotid system: No evidence of dissection, stenosis (50% or  greater) or occlusion.Minor calcific plaque at the bifurcation Left carotid system: No evidence of dissection, stenosis (50% or greater) or occlusion. Minor calcific plaque at the bifurcation. Vertebral arteries: LEFT vertebral dominant. No evidence of dissection, stenosis, or occlusion. Minor ostial calcification on the RIGHT. Skeleton: Mild spondylosis.  No worrisome osseous lesions. Other neck: No masses. Upper chest: Emphysema. Review of the MIP images confirms the above findings CTA HEAD FINDINGS Anterior circulation: No significant stenosis, proximal occlusion, aneurysm, or vascular malformation. Posterior circulation: No significant stenosis, proximal occlusion, aneurysm, or vascular malformation. Venous sinuses: As permitted by contrast timing, patent. Anatomic variants: None. Delayed phase: No abnormal postcontrast enhancement. Review of the MIP images confirms the above findings IMPRESSION: No intracranial or extracranial flow reducing stenosis or occlusion. No arterial dissection is observed. Emphysema (ICD10-J43.9). Electronically Signed   By: Staci Righter M.D.   On: 03/07/2017 15:41   Ct Angio Neck W Or Wo Contrast  Result Date:  03/07/2017 CLINICAL DATA:  Code stroke. Visual difficulty. RIGHT-sided weakness. EXAM: CT ANGIOGRAPHY HEAD AND NECK TECHNIQUE: Multidetector CT imaging of the head and neck was performed using the standard protocol during bolus administration of intravenous contrast. Multiplanar CT image reconstructions and MIPs were obtained to evaluate the vascular anatomy. Carotid stenosis measurements (when applicable) are obtained utilizing NASCET criteria, using the distal internal carotid diameter as the denominator. CONTRAST:  66mL ISOVUE-370 IOPAMIDOL (ISOVUE-370) INJECTION 76% COMPARISON:  Noncontrast CT head earlier.  MR head 06/07/2012. FINDINGS: CTA NECK FINDINGS Aortic arch: Standard branching. Imaged portion shows no evidence of aneurysm or dissection. No significant stenosis of the major arch vessel origins. Right carotid system: No evidence of dissection, stenosis (50% or greater) or occlusion.Minor calcific plaque at the bifurcation Left carotid system: No evidence of dissection, stenosis (50% or greater) or occlusion. Minor calcific plaque at the bifurcation. Vertebral arteries: LEFT vertebral dominant. No evidence of dissection, stenosis, or occlusion. Minor ostial calcification on the RIGHT. Skeleton: Mild spondylosis.  No worrisome osseous lesions. Other neck: No masses. Upper chest: Emphysema. Review of the MIP images confirms the above findings CTA HEAD FINDINGS Anterior circulation: No significant stenosis, proximal occlusion, aneurysm, or vascular malformation. Posterior circulation: No significant stenosis, proximal occlusion, aneurysm, or vascular malformation. Venous sinuses: As permitted by contrast timing, patent. Anatomic variants: None. Delayed phase: No abnormal postcontrast enhancement. Review of the MIP images confirms the above findings IMPRESSION: No intracranial or extracranial flow reducing stenosis or occlusion. No arterial dissection is observed. Emphysema (ICD10-J43.9). Electronically  Signed   By: Staci Righter M.D.   On: 03/07/2017 15:41   Ct Head Code Stroke Wo Contrast  Result Date: 03/07/2017 CLINICAL DATA:  Code stroke. RIGHT-sided weakness. Possible visual field defect. EXAM: CT HEAD WITHOUT CONTRAST TECHNIQUE: Contiguous axial images were obtained from the base of the skull through the vertex without intravenous contrast. COMPARISON:  CT head 06/06/2012.  MR brain 06/07/2012. FINDINGS: Brain: No evidence for acute infarction, hemorrhage, mass lesion, hydrocephalus, or extra-axial fluid. Mild to moderate cerebral and cerebellar atrophy. Multiple chronic areas of cerebral infarction are seen including LEFT frontal, RIGHT occipital, LEFT thalamus, and RIGHT cerebellum. Vascular: Calcification of the cavernous internal carotid arteries consistent with cerebrovascular atherosclerotic disease. No signs of intracranial large vessel occlusion. Skull: Normal. Negative for fracture or focal lesion. Sinuses/Orbits: No acute finding. Other: None. ASPECTS [facility] Stroke Program Early CT Score) - Ganglionic level infarction (caudate, lentiform nuclei, internal capsule, insula, M1-M3 cortex): 7 - Supraganglionic infarction (M4-M6 cortex): 3 Total score (0-10 with 10 being normal):  10 IMPRESSION: 1. Atrophy and small vessel disease. Multiple areas of chronic infarction. No acute intracranial findings are observed. Similar appearance to priors. 2. ASPECTS is 10. These results were called by telephone at the time of interpretation on 03/07/2017 at 3:07 pm to Dr. Nanda Quinton , who verbally acknowledged these results. Electronically Signed   By: Staci Righter M.D.   On: 03/07/2017 15:15    EKG: Independently reviewed.  NSR with rate 61; nonspecific ST changes with no evidence of acute ischemia   Labs on Admission: I have personally reviewed the available labs and imaging studies at the time of the admission.  Pertinent labs:   Glucose 116 BUN 14/Creatinine 1.46/GFR 54; prior 13/1.3 in  4/15 Platelets 142 INR 1.09 ETOH negative UA: 30 protein, rare bacteria  Assessment/Plan Principal Problem:   Visual field defect of right eye Active Problems:   Hyperlipidemia   Essential hypertension   Arteriosclerotic cardiovascular disease (ASCVD)   Central sleep apnea   CKD (chronic kidney disease), stage III (HCC)   Hyperglycemia   OSA (obstructive sleep apnea)   Visual field defect -Patient with acute onset today of right lateral visual field defect -Concerning for TIA/CVA -Will send to [facility] for observation status for CVA/TIA evaluation since there is no neurology available at [facility] until Monday -Telemetry monitoring -CT with atrophy and small vessel disease and multiple areas of chronic infarction. -CTA head/neck negative. -MRI ordered -Echo ordered -Risk stratification with FLP, A1c; will also check TSH and UDS -Will add ASA daily to Plavix -PT/OT/ST/Nutrition Consults  HTN -Allow permissive HTN -Treat BP only if >220/120, and then with goal of 15% reduction -Hold Toprol and plan to restart in 48-72 hours  HLD -Check FLP -Will change from Pravachol to Lipitor 40 mg for now  Hyperglycemia -May be stress response -Will follow with fasting AM labs and A1c  OSA -Not on CPAP -He is likely to benefit from this as an outpatient  CAD -Continue Plavix and CVD RF control -Add ASA as above  CKD -Appears to be chronic rather than acute based on prior levels -Will follow creatinine with repeat BMP in AM    DVT prophylaxis:  Lovenox  Code Status: Full - confirmed with patient Family Communication: None present Disposition Plan:  Home once clinically improved Consults called: Neurology (tele and [facility]); neurology to see patient upon arrival at [facility]  Admission status: It is my clinical opinion that referral for OBSERVATION is reasonable and necessary in this patient based on the above information provided. The aforementioned taken together are felt to place the  patient at high risk for further clinical deterioration. However it is anticipated that the patient may be medically stable for discharge from the hospital within 24 to 48 hours.     Karmen Bongo MD [facility]  If note is complete, please contact covering daytime or nighttime physician. [email or web address] Password [email or web address]  03/07/2017, 6:09 PM

## 2017-03-07 NOTE — Progress Notes (Signed)
Code Stroke Times:  Called [id number] Exam started 5947 Exam finished 0761 Images sent to [place] Exam completed in [place] called [phone]

## 2017-03-07 NOTE — ED Notes (Signed)
Report called to [place]

## 2017-03-07 NOTE — ED Notes (Signed)
Attempted to call report to [facility], was told to call back after 1800 to give report

## 2017-03-07 NOTE — ED Notes (Signed)
Paged out code stroke

## 2017-03-07 NOTE — ED Notes (Signed)
Troponin 0.04.  Dr Laverta [place] notified with no new orders

## 2017-03-08 ENCOUNTER — Observation Stay (HOSPITAL_BASED_OUTPATIENT_CLINIC_OR_DEPARTMENT_OTHER): Payer: Medicare HMO

## 2017-03-08 ENCOUNTER — Observation Stay (HOSPITAL_COMMUNITY): Payer: Medicare HMO

## 2017-03-08 DIAGNOSIS — D6859 Other primary thrombophilia: Secondary | ICD-10-CM | POA: Diagnosis present

## 2017-03-08 DIAGNOSIS — H40003 Preglaucoma, unspecified, bilateral: Secondary | ICD-10-CM | POA: Diagnosis not present

## 2017-03-08 DIAGNOSIS — R748 Abnormal levels of other serum enzymes: Secondary | ICD-10-CM | POA: Diagnosis present

## 2017-03-08 DIAGNOSIS — R0989 Other specified symptoms and signs involving the circulatory and respiratory systems: Secondary | ICD-10-CM | POA: Diagnosis not present

## 2017-03-08 DIAGNOSIS — I6389 Other cerebral infarction: Secondary | ICD-10-CM | POA: Diagnosis not present

## 2017-03-08 DIAGNOSIS — I5022 Chronic systolic (congestive) heart failure: Secondary | ICD-10-CM | POA: Diagnosis present

## 2017-03-08 DIAGNOSIS — H53461 Homonymous bilateral field defects, right side: Secondary | ICD-10-CM | POA: Diagnosis present

## 2017-03-08 DIAGNOSIS — I10 Essential (primary) hypertension: Secondary | ICD-10-CM

## 2017-03-08 DIAGNOSIS — N183 Chronic kidney disease, stage 3 (moderate): Secondary | ICD-10-CM

## 2017-03-08 DIAGNOSIS — H25813 Combined forms of age-related cataract, bilateral: Secondary | ICD-10-CM | POA: Diagnosis not present

## 2017-03-08 DIAGNOSIS — G459 Transient cerebral ischemic attack, unspecified: Secondary | ICD-10-CM | POA: Diagnosis present

## 2017-03-08 DIAGNOSIS — G4733 Obstructive sleep apnea (adult) (pediatric): Secondary | ICD-10-CM | POA: Diagnosis present

## 2017-03-08 DIAGNOSIS — I252 Old myocardial infarction: Secondary | ICD-10-CM | POA: Diagnosis not present

## 2017-03-08 DIAGNOSIS — H53131 Sudden visual loss, right eye: Secondary | ICD-10-CM | POA: Diagnosis not present

## 2017-03-08 DIAGNOSIS — I5030 Unspecified diastolic (congestive) heart failure: Secondary | ICD-10-CM | POA: Diagnosis not present

## 2017-03-08 DIAGNOSIS — H5461 Unqualified visual loss, right eye, normal vision left eye: Secondary | ICD-10-CM | POA: Diagnosis present

## 2017-03-08 DIAGNOSIS — I63432 Cerebral infarction due to embolism of left posterior cerebral artery: Secondary | ICD-10-CM | POA: Diagnosis present

## 2017-03-08 DIAGNOSIS — I639 Cerebral infarction, unspecified: Secondary | ICD-10-CM | POA: Diagnosis not present

## 2017-03-08 DIAGNOSIS — H269 Unspecified cataract: Secondary | ICD-10-CM | POA: Diagnosis present

## 2017-03-08 DIAGNOSIS — R739 Hyperglycemia, unspecified: Secondary | ICD-10-CM | POA: Diagnosis present

## 2017-03-08 DIAGNOSIS — I130 Hypertensive heart and chronic kidney disease with heart failure and stage 1 through stage 4 chronic kidney disease, or unspecified chronic kidney disease: Secondary | ICD-10-CM | POA: Diagnosis present

## 2017-03-08 DIAGNOSIS — Z8673 Personal history of transient ischemic attack (TIA), and cerebral infarction without residual deficits: Secondary | ICD-10-CM | POA: Diagnosis not present

## 2017-03-08 DIAGNOSIS — I6330 Cerebral infarction due to thrombosis of unspecified cerebral artery: Secondary | ICD-10-CM

## 2017-03-08 DIAGNOSIS — H5340 Unspecified visual field defects: Secondary | ICD-10-CM | POA: Diagnosis not present

## 2017-03-08 DIAGNOSIS — G4731 Primary central sleep apnea: Secondary | ICD-10-CM

## 2017-03-08 DIAGNOSIS — Z951 Presence of aortocoronary bypass graft: Secondary | ICD-10-CM | POA: Diagnosis not present

## 2017-03-08 DIAGNOSIS — I2510 Atherosclerotic heart disease of native coronary artery without angina pectoris: Secondary | ICD-10-CM | POA: Diagnosis present

## 2017-03-08 DIAGNOSIS — E785 Hyperlipidemia, unspecified: Secondary | ICD-10-CM | POA: Diagnosis present

## 2017-03-08 DIAGNOSIS — R29701 NIHSS score 1: Secondary | ICD-10-CM | POA: Diagnosis present

## 2017-03-08 LAB — ECHOCARDIOGRAM COMPLETE
Ao-asc: 35 cm
E decel time: 264 msec
FS: 8 % — AB (ref 28–44)
Height: 66 in
IVS/LV PW RATIO, ED: 0.84
LA ID, A-P, ES: 46 mm
LA diam end sys: 46 mm
LA diam index: 2.13 cm/m2
LA vol A4C: 73.9 ml
LA vol index: 43.2 mL/m2
LA vol: 93 mL
LV PW d: 13.4 mm — AB (ref 0.6–1.1)
LVOT area: 2.54 cm2
LVOT diameter: 18 mm
MV Dec: 264
MV Peak grad: 2 mmHg
MV pk A vel: 85.4 m/s
MV pk E vel: 70.8 m/s
Reg peak vel: 311 cm/s
TAPSE: 16.3 mm
TR max vel: 311 cm/s
Weight: 3396.85 oz

## 2017-03-08 LAB — TROPONIN I
Troponin I: 0.03 ng/mL (ref <0.03)
Troponin I: 0.03 ng/mL (ref <0.03)

## 2017-03-08 LAB — SEDIMENTATION RATE: Sed Rate: 7 mm/hr (ref 0–16)

## 2017-03-08 LAB — C-REACTIVE PROTEIN: CRP: <0.8 mg/dL (ref <1.0)

## 2017-03-08 MED ORDER — PERFLUTREN LIPID MICROSPHERE
1.0000 mL | INTRAVENOUS | Status: AC | PRN
  Administered 2017-03-08: 3 mL via INTRAVENOUS
  Filled 2017-03-08 (×2): qty 10

## 2017-03-08 MED ORDER — CYCLOPENTOLATE HCL 1 % OP SOLN
1.0000 [drp] | OPHTHALMIC | Status: AC
  Administered 2017-03-08 (×2): 1 [drp] via OPHTHALMIC
  Filled 2017-03-08: qty 2

## 2017-03-08 MED ORDER — APIXABAN 5 MG PO TABS
5.0000 mg | ORAL_TABLET | Freq: Two times a day (BID) | ORAL | Status: DC
  Administered 2017-03-08 – 2017-03-09 (×2): 5 mg via ORAL
  Filled 2017-03-08 (×2): qty 1

## 2017-03-08 MED ORDER — STROKE: EARLY STAGES OF RECOVERY BOOK
Freq: Once | Status: AC
  Administered 2017-03-08: 10:00:00

## 2017-03-08 MED ORDER — PHENYLEPHRINE HCL 2.5 % OP SOLN
1.0000 [drp] | Freq: Once | OPHTHALMIC | Status: AC
  Administered 2017-03-08: 1 [drp] via OPHTHALMIC
  Filled 2017-03-08: qty 2

## 2017-03-08 MED ORDER — CYCLOPENTOLATE HCL 1 % OP SOLN
1.0000 [drp] | OPHTHALMIC | Status: DC
  Filled 2017-03-08: qty 2

## 2017-03-08 NOTE — Progress Notes (Signed)
STROKE TEAM PROGRESS NOTE   HISTORY OF PRESENT ILLNESS (per record) Addis L Scheck is a 71 y.o. male who is in his normal state of health earlier today when he had a sudden onset of visual change in the right eye.  He states that he was watching TV when he started noticing flashing lights in the right lower visual field.  Flashing lights persisted for about 15 minutes and then resolved, followed by persistent decreased vision in the right lower visual field of the right eye.  He was evaluated in the emergency department where there was concern for stroke and therefore he has been transferred to [facility] for further evaluation.  [name]: 10:30 AM tpa given?: no, outside window   SUBJECTIVE (INTERVAL HISTORY) The patient states that he feels fine. He still has a right visual field deficit. Dr. Leonie Man encouraged him to turn his head to the right and scan so that he doesn't miss things. He denies any prior history of atrial fibrillation but does have history of coronary artery disease.  OBJECTIVE Temp:  [97.5 F (36.4 C)-98.4 F (36.9 C)] 98.3 F (36.8 C) (12/02 0507) Pulse Rate:  [56-77] 62 (12/02 0507) Cardiac Rhythm: Normal sinus rhythm (12/01 2143) Resp:  [12-21] 18 (12/02 0507) BP: (142-177)/(66-96) 142/81 (12/02 0507) SpO2:  [93 %-99 %] 96 % (12/02 0507) FiO2 (%):  [0 %] 0 % (12/01 2101) Weight:  [212 lb 4.9 oz (96.3 kg)-220 lb (99.8 kg)] 212 lb 4.9 oz (96.3 kg) (12/01 2100)  CBC:  Recent Labs  Lab 03/07/17 1457 03/07/17 1513  WBC 7.1  --   NEUTROABS 3.5  --   HGB 14.9 17.0  HCT 46.6 50.0  MCV 87.4  --   PLT 142*  --     Basic Metabolic Panel:  Recent Labs  Lab 03/07/17 1457 03/07/17 1513  NA 139 141  K 4.6 4.3  CL 106 103  CO2 29  --   GLUCOSE 116* 110*  BUN 14 14  CREATININE 1.46* 1.40*  CALCIUM 9.0  --     Lipid Panel:     Component Value Date/Time   CHOL 78 07/05/2013 0814   TRIG 82 07/05/2013 0814   HDL 39 (L) 07/05/2013 0814   CHOLHDL 2.0  07/05/2013 0814   VLDL 16 07/05/2013 0814   LDLCALC 23 07/05/2013 0814   HgbA1c:  Lab Results  Component Value Date   HGBA1C (H) 07/26/2010    5.8 (NOTE)                                                                       According to the ADA Clinical Practice Recommendations for 2011, when HbA1c is used as a screening test:   >=6.5%   Diagnostic of Diabetes Mellitus           (if abnormal result  is confirmed)  5.7-6.4%   Increased risk of developing Diabetes Mellitus  References:Diagnosis and Classification of Diabetes Mellitus,Diabetes EXHB,7169,67(ELFYB 1):S62-S69 and Standards of Medical Care in         Diabetes - 2011,Diabetes Care,2011,34  (Suppl 1):S11-S61.   Urine Drug Screen:     Component Value Date/Time   LABOPIA NONE DETECTED 03/07/2017 [place] DETECTED 03/07/2017 1446  LABBENZ NONE DETECTED 03/07/2017 1446   AMPHETMU NONE DETECTED 03/07/2017 1446   THCU NONE DETECTED 03/07/2017 1446   LABBARB NONE DETECTED 03/07/2017 1446    Alcohol Level     Component Value Date/Time   ETH <10 03/07/2017 1457    IMAGING   Ct Angio Head W Or Wo Contrast Ct Angio Neck W Or Wo Contrast 03/07/2017 IMPRESSION:  No intracranial or extracranial flow reducing stenosis or occlusion.  No arterial dissection is observed.  Emphysema (ICD10-J43.9).    Dg Chest 2 View 03/08/2017 IMPRESSION:  Cardiomegaly. Vascular congestion with suspected mild interstitial edema.     Mr Brain Wo Contrast 03/08/2017 IMPRESSION:  Acute LEFT PCA territory infarct primarily involving the medial LEFT occipital lobe. This is nonhemorrhagic. Atrophy, small vessel disease, and areas of multiple old cerebral and cerebellar infarction. No associated large vessel occlusion. Proximal LEFT PCA demonstrates patency on MRA and CTA.   Ct Head Code Stroke Wo Contrast 03/07/2017 IMPRESSION:  1. Atrophy and small vessel disease. Multiple areas of chronic infarction. No acute intracranial  findings are observed. Similar appearance to priors.  2. ASPECTS is 10.     Transthoracic Echocardiogram - pending 00/00/00     PHYSICAL EXAM Vitals:   03/07/17 2100 03/07/17 2101 03/08/17 0128 03/08/17 0507  BP:   (!) 143/69 (!) 142/81  Pulse: 77  63 62  Resp: 18  18 18   Temp: 98 F (36.7 C)  98.4 F (36.9 C) 98.3 F (36.8 C)  TempSrc: Oral  Oral Oral  SpO2: 97% 97% 97% 96%  Weight: 212 lb 4.9 oz (96.3 kg)     Height: 5\' 6"  (1.676 m)      Pleasant elderly African-American male currently not in distress. . Afebrile. Head is nontraumatic. Neck is supple without bruit.    Cardiac exam no murmur or gallop. Lungs are clear to auscultation. Distal pulses are well felt. Neurological Exam :  Awake alert oriented 3 with normal speech and language function. No aphasia, dysarthria. Follows commands well. Extraocular moments are full range without nystagmus. Dense right homonymous hemianopsia. Fundi not visualized. Vision acuity seems adequate. Face is symmetric. Tongue midline. Motor system exam reveals no upper or lower extremity drift. Symmetric and equal strength in all 4 extremities. Mild diminished fine finger movements on the right. Orbits left over right upper extremity. Sensation intact. Coordination accurate. Gait not tested.  ASSESSMENT/PLAN Mr. BOLIVAR KORANDA is a 71 y.o. male with history of substance abuse, pulmonary embolism, previous strokes by imaging, hyperlipidemia, and coronary artery disease with previous MI presenting with visual changes in his right eye. He did not receive IV t-PA due to late presentation.  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  Resultant right homonymous hemianopsia  CT head - Atrophy and small vessel disease. Multiple areas of chronic infarction.  MRI head - Acute LEFT PCA territory infarct primarily involving the medial LEFT occipital lobe  MRA head - not performed  CTA H&N - No intracranial or extracranial  flow reducing stenosis or occlusion.   Carotid Doppler - CTA neck  2D Echo - pending  LDL - pending  HgbA1c - pending  VTE prophylaxis - Lovenox Diet Heart Room service appropriate? Yes; Fluid consistency: Thin  clopidogrel 75 mg daily prior to admission, now on clopidogrel 75 mg daily  (NPO)  Patient counseled to be compliant with his antithrombotic medications  Ongoing aggressive stroke risk factor management  Therapy recommendations:  pending  Disposition:  Pending  Hypertension  Stable  Permissive hypertension (OK if < 220/120) but gradually normalize in 5-7 days  Long-term BP goal normotensive  Hyperlipidemia  Home meds:  Pravachol 20 mg daily changed to Lipitor 40 mg daily  LDL pending, goal < 70  Continue statin at discharge   Other Stroke Risk Factors  Advanced age  ETOH use, advised to drink no more than 1 drink per day.  ETOH use, advised to drink no more than 1 drink per day.  Obesity, Body mass index is 34.27 kg/m., recommend weight loss, diet and exercise as appropriate   Hx stroke/TIA  Coronary artery disease   Other Active Problems  Creat - 1.40  Plts - 1.42   Plan / Recommendations  TEE and possible loop Monday - message sent to "cardmaster and NPO order written.    Hospital day # 0  Mikey Bussing PA-C [facility] Pager [phone] 03/08/2017, 8:34 AM  I have personally examined this patient, reviewed notes, independently viewed imaging studies, participated in medical decision making and plan of care.ROS completed by me personally and pertinent positives fully documented  I have made any additions or clarifications directly to the above note. Agree with note above. Continue Plavix for stroke prevention and ongoing stroke workup. May likely need TEE and loop recorder next week. Patient advised not to drive till vision loss improves. Long discussion with patient and Dr. Wynelle Cleveland and answered questions. Greater than  50% time during this 35 minute visit was spent on counseling and coordination of care about his embolic stroke, peripheral vision loss and prevention and treatment discussion and answering questions.  Antony Contras, MD Medical Director [facility] Pager: [phone] 03/08/2017 11:59 AM   To contact Stroke Continuity provider, please refer to [email or web address] After hours, contact General Neurology

## 2017-03-08 NOTE — Progress Notes (Signed)
  Echocardiogram 2D Echocardiogram has been performed.  [name] 03/08/2017, 1:55 PM

## 2017-03-08 NOTE — Evaluation (Signed)
Physical Therapy Evaluation Patient Details Name: [name] MRN: [id number] DOB: 1946/02/13 Today's Date: 03/08/2017   History of Present Illness    71 y.o.malewith medical history significant ofremote cocaine abuse; PE; HLD; HTN; CVA; OSA not on CPAP; and ASCVD s/p CABG in 2011 presenting with right-sided peripheral vision loss    Clinical Impression  Pt presents at baseline with no obvious deficits and no complaints related to functional mobility.  No PT needed.     Follow Up Recommendations No PT follow up    Equipment Recommendations  None recommended by PT    Recommendations for Other Services       Precautions / Restrictions        Mobility  Bed Mobility Overal bed mobility: Independent                Transfers Overall transfer level: Independent                  Ambulation/Gait Ambulation/Gait assistance: Independent Ambulation Distance (Feet): 220 Feet Assistive device: None Gait Pattern/deviations: Step-through pattern     General Gait Details: mildly decr speed with no other obvious abnormalities  Stairs            Wheelchair Mobility    Modified Rankin (Stroke Patients Only) Modified Rankin (Stroke Patients Only) Pre-Morbid Rankin Score: No symptoms Modified Rankin: No significant disability     Balance Overall balance assessment: Modified Independent                               Standardized Balance Assessment Standardized Balance Assessment : Dynamic Gait Index   Dynamic Gait Index Level Surface: Normal Change in Gait Speed: Normal Gait with Horizontal Head Turns: Normal Gait with Vertical Head Turns: Mild Impairment Gait and Pivot Turn: Mild Impairment Step Over Obstacle: Normal Step Around Obstacles: Mild Impairment Steps: Mild Impairment Total Score: 20       Pertinent Vitals/Pain Pain Assessment: No/denies pain    Home Living Family/patient expects to be discharged to:: Private  residence Living Arrangements: Alone Available Help at Discharge: Family;Available PRN/intermittently Type of Home: Mobile home Home Access: Stairs to enter   Entrance Stairs-Number of Steps: 3 Home Layout: One level Home Equipment: None Additional Comments: daughters live right next door    Prior Function Level of Independence: Independent               Hand Dominance   Dominant Hand: Right    Extremity/Trunk Assessment   Upper Extremity Assessment Upper Extremity Assessment: Overall WFL for tasks assessed    Lower Extremity Assessment Lower Extremity Assessment: Overall WFL for tasks assessed    Cervical / Trunk Assessment Cervical / Trunk Assessment: Normal  Communication   Communication: No difficulties  Cognition Arousal/Alertness: Awake/alert Behavior During Therapy: WFL for tasks assessed/performed Overall Cognitive Status: Within Functional Limits for tasks assessed                                        General Comments General comments (skin integrity, edema, etc.): slightly distractible but otherwise no mobility deficits     Exercises     Assessment/Plan    PT Assessment Patent does not need any further PT services  PT Problem List         PT Treatment Interventions      PT Goals (Current  goals can be found in the Care Plan section)  Acute Rehab PT Goals Patient Stated Goal: home PT Goal Formulation: All assessment and education complete, DC therapy    Frequency     Barriers to discharge        Co-evaluation               AM-PAC PT "6 Clicks" Daily Activity  Outcome Measure Difficulty turning over in bed (including adjusting bedclothes, sheets and blankets)?: None Difficulty moving from lying on back to sitting on the side of the bed? : None Difficulty sitting down on and standing up from a chair with arms (e.g., wheelchair, bedside commode, etc,.)?: None Help needed moving to and from a bed to chair  (including a wheelchair)?: None Help needed walking in hospital room?: None Help needed climbing 3-5 steps with a railing? : None 6 Click Score: 24    End of Session   Activity Tolerance: Patient tolerated treatment well Patient left: in bed;with call bell/phone within reach Nurse Communication: Mobility status PT Visit Diagnosis: Difficulty in walking, not elsewhere classified (R26.2)    Time: 1749-4496 PT Time Calculation (min) (ACUTE ONLY): 18 min   Charges:   PT Evaluation $PT Eval Low Complexity: 1 Low     PT G Codes:   PT G-Codes **NOT FOR INPATIENT CLASS** Functional Assessment Tool Used: AM-PAC 6 Clicks Basic Mobility Functional Limitation: Mobility: Walking and moving around Mobility: Walking and Moving Around Current Status (P5916): 0 percent impaired, limited or restricted Mobility: Walking and Moving Around Goal Status (B8466): 0 percent impaired, limited or restricted Mobility: Walking and Moving Around Discharge Status (Z9935): 0 percent impaired, limited or restricted    Kearney Hard, PT, DPT, MS Board Certified Geriatric Clinical Specialist  Herbie Drape 03/08/2017, 4:11 PM

## 2017-03-08 NOTE — Consult Note (Signed)
[name]                                                                               03/08/2017                                              Ophthalmology Consultation                                         Consult requested by: Dr. Roland Rack, MD  Reason for consultation:  Visual field loss right eye (inferotemporal/temporal)  HPI:  Patient notes a 1 day history of temporal vision loss in right eye.  Denies limb weakness or paralysis on either side/extremities.  Pertinent Medical History:   Active Ambulatory Problems    Diagnosis Date Noted  . Hyperlipidemia 11/24/2008  . Essential hypertension 11/24/2008  . Arteriosclerotic cardiovascular disease (ASCVD)   . Pulmonary embolism ([place]) 03/07/2010  . CVA (cerebral infarction) 06/09/2012  . Chronic anticoagulation-discontinued 10/19/2012  . 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 Special screening for malignant neoplasms, colon    Resolved Ambulatory Problems    Diagnosis Date Noted  . ABNORMAL CV (STRESS) TEST 01/15/2010  . Chronic anticoagulation   . TIA (transient ischemic attack) 06/09/2012   Past Medical History:  Diagnosis Date  . Arteriosclerotic cardiovascular disease (ASCVD) 1998  . Chronic anticoagulation   . Hyperlipidemia   . Keloid   . Pulmonary embolism ([place]) 03/2010  . Substance abuse ([place])       Pertinent Ophthalmic History: Myopia/presbyopia  Current Eye Medications: none  Systemic medications on admission:   Medications Prior to Admission  Medication Sig Dispense Refill  . clopidogrel (PLAVIX) 75 MG tablet TAKE 1 TABLET (75 MG TOTAL) BY MOUTH DAILY WITH BREAKFAST. 30 tablet 11  . cycloSPORINE (RESTASIS) 0.05 % ophthalmic emulsion Place 1 drop into both eyes daily as needed.    . fluticasone (FLONASE) 50 MCG/ACT nasal spray Place 2 sprays into both nostrils 2 (two) times daily as needed for allergies or rhinitis.    . metoprolol succinate (TOPROL-XL) 50 MG 24 hr tablet TAKE 1 TABLET (50 MG  TOTAL) BY MOUTH DAILY. TAKE WITH OR IMMEDIATELY FOLLOWING A MEAL. 30 tablet 11  . nitroGLYCERIN (NITROSTAT) 0.4 MG SL tablet Place 1 tablet (0.4 mg total) under the tongue every 5 (five) minutes as needed for chest pain. up to 3 doses. 25 tablet 4  . pravastatin (PRAVACHOL) 20 MG tablet TAKE 1 TABLET BY MOUTH EVERY EVENING 90 tablet 3       ROS: Noncontributory.  Negative GI/GU/Heme/Onc/Resp/MS/Const/ENT/Integ/Psych/Endocrine/MS +Eyes - VF defect  +CV +Neuro - multiplce infarcts noted on MRI   Visual Fields: inferotemporal VF defect right eye (VF defect not appreciated left eye)   Pupils:  Pharmacologically dilated at my direction before exam   Equal, brisk, no APD     Visual acuity:   20/25 cc  OD   CSM       20/25 cc  OS   CSM   TA:        Normal to palpation OU     Dilation:  both eyes        Medication used  NS 2.5%, Cyclogyl  External:   OD:  Normal      OS:  Normal     Anterior segment exam:  By penlight   By slit lap      Conjunctiva:  OD:  Quiet      OS:  Quiet     Cornea:    OD: Clear, no fluorescein stain       OS: Clear, no fluorescein stain      Anterior Chamber:   OD:  Deep/quiet      OS:  Deep/quiet     Iris:    OD:  Normal       OS:  Normal      Lens:    OD:  1+ NS        OS:  1+ NS         Optic disc:  OD:  Flat, sharp, pink, healthy, C/D 0.7     OS:  Flat, sharp, pink, healthy. C/D 0.7      Central retina--examined with indirect ophthalmoscope:  OD:  Macula and vessels normal; media clear, no evidence of retinal tear/detachment      OS:  Macula and vessels normal; media clear,  no evidence of retinal tear/detachment         Peripheral retina--examined with indirect ophthalmoscope with lid speculum and scleral depression:   OD:  Normal to ora 360 degrees      OS:  Normal to ora 360 degrees      Impression:     1. Left PVA infartct - Left occipital stroke consistent with visual field loss and confirmed with positive MRI (CT results did  not reveal evidence of stroke/infarct).  No evidence of retina tear/detachment 2. Cataracts OU 3. Glaucoma suspect OU  Recommendations/Plan:    Continue with stroke work-up/treatment.  F/u for ophthalmologic exam including IOP check and OCT NFL of both eyes to determine glaucoma risk  I've discussed these findings with the nurse and hospitalist. Please contact our office with any questions or concerns at [phone].  Royston Cowper

## 2017-03-08 NOTE — Progress Notes (Signed)
PROGRESS NOTE    [name]   OIZ:[id number]  DOB: 1946-01-23  DOA: 03/07/2017 PCP: Rosita Fire, MD   Brief Narrative:  [name] is a 71 y.o. male with medical history significant of remote cocaine abuse; PE; HLD; HTN; CVA; OSA not on CPAP; and ASCVD s/p CABG in 2011 presenting with right-sided peripheral vision loss     Subjective: Continues to have visual difficulty. No new complaints.  ROS: no complaints of nausea, vomiting, constipation diarrhea, cough, dyspnea or dysuria. No other complaints.   Assessment & Plan:   Principal Problem: Visual field defect of right eye-   Cerebral thrombosis with cerebral infarction - CT head: Multiple areas of chronic infarction - MRI brain: Acute LEFT PCA territory infarct primarily involving the medial LEFT occipital lobe-  Nonhemorrhagic-Areas of multiple old cerebral and cerebellar infarction - 2 D ECHO and   CT head: No intracranial or extracranial flow reducing stenosis or occlusion.No arterial dissection is observed. - Plavix and statin for now - plan for TEE tomorrow if ECHO abnormal per Dr Leonie Man   Active Problems:   Hyperlipidemia - Lipitor  Mildly elevated Troponin - flat trend- no further work up    Essential hypertension - Metoprolol on hold today to allow for permissive HTN     CKD (chronic kidney disease), stage III (HCC)     OSA (obstructive sleep apnea) - CPAP   DVT prophylaxis: Lovenox Code Status: Full code Family Communication:  Disposition Plan: home when work up complete Consultants:   neuro Procedures:    Antimicrobials:  Anti-infectives (From admission, onward)   None       Objective: Vitals:   03/08/17 0128 03/08/17 0507 03/08/17 0800 03/08/17 1000  BP: (!) 143/69 (!) 142/81 130/70 (!) 155/71  Pulse: 63 62 86 63  Resp: 18 18 18    Temp: 98.4 F (36.9 C) 98.3 F (36.8 C) 97.8 F (36.6 C) 98.3 F (36.8 C)  TempSrc: Oral Oral Oral Oral  SpO2: 97% 96% 96% 100%  Weight:       Height:        Intake/Output Summary (Last 24 hours) at 03/08/2017 1036 Last data filed at 03/08/2017 0600 Gross per 24 hour  Intake 739.17 ml  Output 200 ml  Net 539.17 ml   Filed Weights   03/07/17 1436 03/07/17 2100  Weight: 99.8 kg (220 lb) 96.3 kg (212 lb 4.9 oz)    Examination: General exam: Appears comfortable  HEENT: PERRLA, oral mucosa moist, no sclera icterus or thrush Respiratory system: Clear to auscultation. Respiratory effort normal. Cardiovascular system: S1 & S2 heard, RRR.  No murmurs  Gastrointestinal system: Abdomen soft, non-tender, nondistended. Normal bowel sound. No organomegaly Central nervous system: Alert and oriented. Right hemionopsia. No focal neurological deficits. Extremities: No cyanosis, clubbing or edema Skin: No rashes or ulcers Psychiatry:  Mood & affect appropriate.     Data Reviewed: I have personally reviewed following labs and imaging studies  CBC: Recent Labs  Lab 03/07/17 1457 03/07/17 1513  WBC 7.1  --   NEUTROABS 3.5  --   HGB 14.9 17.0  HCT 46.6 50.0  MCV 87.4  --   PLT 142*  --    Basic Metabolic Panel: Recent Labs  Lab 03/07/17 1457 03/07/17 1513  NA 139 141  K 4.6 4.3  CL 106 103  CO2 29  --   GLUCOSE 116* 110*  BUN 14 14  CREATININE 1.46* 1.40*  CALCIUM 9.0  --    GFR:  Estimated Creatinine Clearance: 53.3 mL/min (A) (by C-G formula based on SCr of 1.4 mg/dL (H)). Liver Function Tests: Recent Labs  Lab 03/07/17 1457  AST 22  ALT 21  ALKPHOS 59  BILITOT 1.2  PROT 7.7  ALBUMIN 4.0   No results for input(s): LIPASE, AMYLASE in the last 168 hours. No results for input(s): AMMONIA in the last 168 hours. Coagulation Profile: Recent Labs  Lab 03/07/17 1457  INR 1.09   Cardiac Enzymes: Recent Labs  Lab 03/07/17 1505 03/07/17 2114 03/08/17 0216 03/08/17 0823  TROPONINI 0.04* 0.03* 0.03* 0.03*   BNP (last 3 results) No results for input(s): PROBNP in the last 8760 hours. HbA1C: No results  for input(s): HGBA1C in the last 72 hours. CBG: No results for input(s): GLUCAP in the last 168 hours. Lipid Profile: No results for input(s): CHOL, HDL, LDLCALC, TRIG, CHOLHDL, LDLDIRECT in the last 72 hours. Thyroid Function Tests: Recent Labs    03/07/17 1457  TSH 1.370   Anemia Panel: No results for input(s): VITAMINB12, FOLATE, FERRITIN, TIBC, IRON, RETICCTPCT in the last 72 hours. Urine analysis:    Component Value Date/Time   COLORURINE STRAW (A) 03/07/2017 1446   APPEARANCEUR CLEAR 03/07/2017 1446   LABSPEC 1.032 (H) 03/07/2017 1446   PHURINE 7.0 03/07/2017 1446   GLUCOSEU NEGATIVE 03/07/2017 1446   HGBUR NEGATIVE 03/07/2017 1446   BILIRUBINUR NEGATIVE 03/07/2017 1446   KETONESUR NEGATIVE 03/07/2017 1446   PROTEINUR 30 (A) 03/07/2017 1446   UROBILINOGEN 1.0 03/16/2010 0652   NITRITE NEGATIVE 03/07/2017 1446   LEUKOCYTESUR NEGATIVE 03/07/2017 1446   Sepsis Labs: @LABRCNTIP (procalcitonin:4,lacticidven:4) )No results found for this or any previous visit (from the past 240 hour(s)).       Radiology Studies: Ct Angio Head W Or Wo Contrast  Result Date: 03/07/2017 CLINICAL DATA:  Code stroke. Visual difficulty. RIGHT-sided weakness. EXAM: CT ANGIOGRAPHY HEAD AND NECK TECHNIQUE: Multidetector CT imaging of the head and neck was performed using the standard protocol during bolus administration of intravenous contrast. Multiplanar CT image reconstructions and MIPs were obtained to evaluate the vascular anatomy. Carotid stenosis measurements (when applicable) are obtained utilizing NASCET criteria, using the distal internal carotid diameter as the denominator. CONTRAST:  34mL ISOVUE-370 IOPAMIDOL (ISOVUE-370) INJECTION 76% COMPARISON:  Noncontrast CT head earlier.  MR head 06/07/2012. FINDINGS: CTA NECK FINDINGS Aortic arch: Standard branching. Imaged portion shows no evidence of aneurysm or dissection. No significant stenosis of the major arch vessel origins. Right carotid  system: No evidence of dissection, stenosis (50% or greater) or occlusion.Minor calcific plaque at the bifurcation Left carotid system: No evidence of dissection, stenosis (50% or greater) or occlusion. Minor calcific plaque at the bifurcation. Vertebral arteries: LEFT vertebral dominant. No evidence of dissection, stenosis, or occlusion. Minor ostial calcification on the RIGHT. Skeleton: Mild spondylosis.  No worrisome osseous lesions. Other neck: No masses. Upper chest: Emphysema. Review of the MIP images confirms the above findings CTA HEAD FINDINGS Anterior circulation: No significant stenosis, proximal occlusion, aneurysm, or vascular malformation. Posterior circulation: No significant stenosis, proximal occlusion, aneurysm, or vascular malformation. Venous sinuses: As permitted by contrast timing, patent. Anatomic variants: None. Delayed phase: No abnormal postcontrast enhancement. Review of the MIP images confirms the above findings IMPRESSION: No intracranial or extracranial flow reducing stenosis or occlusion. No arterial dissection is observed. Emphysema (ICD10-J43.9). Electronically Signed   By: Staci Righter M.D.   On: 03/07/2017 15:41   Dg Chest 2 View  Result Date: 03/08/2017 CLINICAL DATA:  TIA EXAM: CHEST  2  VIEW COMPARISON:  03/22/2012 FINDINGS: Cardiomegaly. Prior CABG. Vascular congestion. Mild interstitial prominence may reflect interstitial edema. No effusions. IMPRESSION: Cardiomegaly. Vascular congestion with suspected mild interstitial edema. Electronically Signed   By: Rolm Baptise M.D.   On: 03/08/2017 07:53   Ct Angio Neck W Or Wo Contrast  Result Date: 03/07/2017 CLINICAL DATA:  Code stroke. Visual difficulty. RIGHT-sided weakness. EXAM: CT ANGIOGRAPHY HEAD AND NECK TECHNIQUE: Multidetector CT imaging of the head and neck was performed using the standard protocol during bolus administration of intravenous contrast. Multiplanar CT image reconstructions and MIPs were obtained to  evaluate the vascular anatomy. Carotid stenosis measurements (when applicable) are obtained utilizing NASCET criteria, using the distal internal carotid diameter as the denominator. CONTRAST:  2mL ISOVUE-370 IOPAMIDOL (ISOVUE-370) INJECTION 76% COMPARISON:  Noncontrast CT head earlier.  MR head 06/07/2012. FINDINGS: CTA NECK FINDINGS Aortic arch: Standard branching. Imaged portion shows no evidence of aneurysm or dissection. No significant stenosis of the major arch vessel origins. Right carotid system: No evidence of dissection, stenosis (50% or greater) or occlusion.Minor calcific plaque at the bifurcation Left carotid system: No evidence of dissection, stenosis (50% or greater) or occlusion. Minor calcific plaque at the bifurcation. Vertebral arteries: LEFT vertebral dominant. No evidence of dissection, stenosis, or occlusion. Minor ostial calcification on the RIGHT. Skeleton: Mild spondylosis.  No worrisome osseous lesions. Other neck: No masses. Upper chest: Emphysema. Review of the MIP images confirms the above findings CTA HEAD FINDINGS Anterior circulation: No significant stenosis, proximal occlusion, aneurysm, or vascular malformation. Posterior circulation: No significant stenosis, proximal occlusion, aneurysm, or vascular malformation. Venous sinuses: As permitted by contrast timing, patent. Anatomic variants: None. Delayed phase: No abnormal postcontrast enhancement. Review of the MIP images confirms the above findings IMPRESSION: No intracranial or extracranial flow reducing stenosis or occlusion. No arterial dissection is observed. Emphysema (ICD10-J43.9). Electronically Signed   By: Staci Righter M.D.   On: 03/07/2017 15:41   Mr Brain Wo Contrast  Result Date: 03/08/2017 CLINICAL DATA:  Sudden onset of visual change 03/06/2017. Flashing lights were followed by obscuration of the RIGHT lower visual field. Stroke risk factors include multiple prior strokes, hypertension, and hyperlipidemia. History  of substance abuse. EXAM: MRI HEAD WITHOUT CONTRAST TECHNIQUE: Multiplanar, multiecho pulse sequences of the brain and surrounding structures were obtained without intravenous contrast. COMPARISON:  CT head without contrast and CTA head neck 03/07/2017. FINDINGS: Brain: Restricted diffusion involves the LEFT occipital lobe, primarily calcarine cortex, extending toward the medial and posterior temporal lobe, corresponding low ADC, consistent with acute infarction. No other areas of acute stroke are observed. No hemorrhage, mass lesion, hydrocephalus, or extra-axial fluid. Generalized atrophy. Mild to moderate subcortical and periventricular T2 and FLAIR hyperintensities, likely chronic microvascular ischemic change. Multiple areas of chronic infarction are observed throughout the brain, most notable LEFT frontal region, also RIGHT inferior cerebellum. Lacunar infarct LEFT thalamus is chronic. Prominent perivascular spaces. Vascular: Flow voids are maintained in the carotid, basilar, and vertebral arteries. No LEFT PCA occlusion is evident. Skull and upper cervical spine: Normal marrow signal. Sinuses/Orbits: Chronic sinus disease.  No orbital findings. Other: Compared with yesterday's CT, the infarct is not visible. IMPRESSION: Acute LEFT PCA territory infarct primarily involving the medial LEFT occipital lobe. This is nonhemorrhagic. Atrophy, small vessel disease, and areas of multiple old cerebral and cerebellar infarction. No associated large vessel occlusion. Proximal LEFT PCA demonstrates patency on MRA and CTA. Electronically Signed   By: Staci Righter M.D.   On: 03/08/2017 07:44   Ct Head  Code Stroke Wo Contrast  Result Date: 03/07/2017 CLINICAL DATA:  Code stroke. RIGHT-sided weakness. Possible visual field defect. EXAM: CT HEAD WITHOUT CONTRAST TECHNIQUE: Contiguous axial images were obtained from the base of the skull through the vertex without intravenous contrast. COMPARISON:  CT head 06/06/2012.  MR  brain 06/07/2012. FINDINGS: Brain: No evidence for acute infarction, hemorrhage, mass lesion, hydrocephalus, or extra-axial fluid. Mild to moderate cerebral and cerebellar atrophy. Multiple chronic areas of cerebral infarction are seen including LEFT frontal, RIGHT occipital, LEFT thalamus, and RIGHT cerebellum. Vascular: Calcification of the cavernous internal carotid arteries consistent with cerebrovascular atherosclerotic disease. No signs of intracranial large vessel occlusion. Skull: Normal. Negative for fracture or focal lesion. Sinuses/Orbits: No acute finding. Other: None. ASPECTS [facility] Stroke Program Early CT Score) - Ganglionic level infarction (caudate, lentiform nuclei, internal capsule, insula, M1-M3 cortex): 7 - Supraganglionic infarction (M4-M6 cortex): 3 Total score (0-10 with 10 being normal): 10 IMPRESSION: 1. Atrophy and small vessel disease. Multiple areas of chronic infarction. No acute intracranial findings are observed. Similar appearance to priors. 2. ASPECTS is 10. These results were called by telephone at the time of interpretation on 03/07/2017 at 3:07 pm to Dr. Nanda Quinton , who verbally acknowledged these results. Electronically Signed   By: Staci Righter M.D.   On: 03/07/2017 15:15      Scheduled Meds: .  stroke: mapping our early stages of recovery book   Does not apply Once  . atorvastatin  40 mg Oral q1800  . clopidogrel  75 mg Oral Daily  . enoxaparin (LOVENOX) injection  40 mg Subcutaneous Q24H  . pneumococcal 23 valent vaccine  0.5 mL Intramuscular Tomorrow-1000   Continuous Infusions: . sodium chloride 50 mL/hr at 03/08/17 0903     LOS: 0 days    Time spent in minutes: 35    Debbe Odea, MD [facility] Pager: [email or web address] Password [facility] 03/08/2017, 10:36 AM

## 2017-03-08 NOTE — Progress Notes (Signed)
Placed patient on CPAP for the night.  Patient is tolerating well at this time. 

## 2017-03-08 NOTE — Progress Notes (Signed)
Received verbal order from MD.

## 2017-03-09 ENCOUNTER — Inpatient Hospital Stay (HOSPITAL_COMMUNITY): Payer: Medicare HMO

## 2017-03-09 DIAGNOSIS — I10 Essential (primary) hypertension: Secondary | ICD-10-CM | POA: Diagnosis not present

## 2017-03-09 DIAGNOSIS — I2510 Atherosclerotic heart disease of native coronary artery without angina pectoris: Secondary | ICD-10-CM | POA: Diagnosis not present

## 2017-03-09 DIAGNOSIS — G4733 Obstructive sleep apnea (adult) (pediatric): Secondary | ICD-10-CM | POA: Diagnosis not present

## 2017-03-09 DIAGNOSIS — E785 Hyperlipidemia, unspecified: Secondary | ICD-10-CM

## 2017-03-09 LAB — LIPID PANEL
Cholesterol: 116 mg/dL (ref 0–200)
HDL: 43 mg/dL (ref >40)
LDL Cholesterol: 58 mg/dL (ref 0–99)
Total CHOL/HDL Ratio: 2.7 RATIO
Triglycerides: 76 mg/dL (ref <150)
VLDL: 15 mg/dL (ref 0–40)

## 2017-03-09 MED ORDER — METOPROLOL SUCCINATE ER 25 MG PO TB24
50.0000 mg | ORAL_TABLET | Freq: Every day | ORAL | Status: DC
  Administered 2017-03-09: 50 mg via ORAL
  Filled 2017-03-09: qty 2

## 2017-03-09 MED ORDER — ATORVASTATIN CALCIUM 40 MG PO TABS: 40.0000 mg | ORAL_TABLET | Freq: Every day | ORAL | 0 refills | Status: DC

## 2017-03-09 MED ORDER — APIXABAN 5 MG PO TABS: 5.0000 mg | ORAL_TABLET | Freq: Two times a day (BID) | ORAL | 0 refills | Status: DC

## 2017-03-09 NOTE — Progress Notes (Signed)
CM consulted for benefits check for Eliquis 5 mg BID::  Eliquis 5 mg BID co-pay is $3.70 for 30 day supply.  No PA required,pharmacies [place] are [facility] and [facility].   Patient comfortable with this price. CM provided him the 30 day free card.  CM continuing to follow for further d/c needs.

## 2017-03-09 NOTE — Progress Notes (Signed)
OT Evaluation  PTA, pt lived alone and was independent with ADL and mobility. Pt presents with a R visual field deficit. Recommend pt follow up with his eye doctor to have a full field visual assessment and follow up with outpt  OT (pt prefers OT at the [facility]). Will follow acutely to facilitate safe DC home. Recommend family S medication and financial management at this time. Pt should refrain from driving until cleared by his physician. Pt verbalized understanding.    03/09/17 1500  OT Visit Information  Last OT Received On 03/09/17  Assistance Needed +1  History of Present Illness 71 yo with complaints of visual changes. Pt with Acute LEFT PCA territory infarct primarily involving the medial LEFT occipital lobe. medical history significant of remote cocaine abuse; PE; HLD; HTN; CVA; OSA not on CPAP; and ASCVD s/p CABG in 2011  Precautions  Precautions Other (comment) (visual field deficit)  Home Living  Family/patient expects to be discharged to: Private residence  Living Arrangements Alone  Available Help at Discharge Family;Available PRN/intermittently  Type of Home Mobile home  Home Access Stairs to enter  Entrance Stairs-Number of Steps 3  Home Layout One level  Bathroom Biomedical scientist Yes  Home Equipment None  Additional Comments daughters live right next door  Prior Function  Level of Independence Independent  Comments drove  Communication  Communication No difficulties  Pain Assessment  Pain Assessment No/denies pain  Cognition  Arousal/Alertness Awake/alert  Behavior During Therapy WFL for tasks assessed/performed  Overall Cognitive Status Within Functional Limits for tasks assessed  Upper Extremity Assessment  Upper Extremity Assessment Overall WFL for tasks assessed  Lower Extremity Assessment  Lower Extremity Assessment Overall WFL for tasks assessed  Cervical / Trunk Assessment   Cervical / Trunk Assessment Normal  ADL  Overall ADL's  Needs assistance/impaired  Functional mobility during ADLs Modified independent  General ADL Comments Pt able to complete basic ADL tasks at modified independent level. Recommend S for [employer] and financial management.   Vision- History  Baseline Vision/History Wears glasses  Wears Glasses At all times  Patient Visual Report Peripheral vision impairment  Vision- Assessment  Vision Assessment? Yes  Eye Alignment WFL  Ocular Range of Motion [facility]  Alignment/Gaze Preference WDL  Tracking/Visual Pursuits Decreased smoothness of horizontal tracking;Decreased smoothness of vertical tracking  Saccades Undershoots;Additional head turns occurred during testing;Additional eye shifts occurred during testing  Convergence WFL  Visual Fields Right visual field deficit  Additional Comments central vision appers intact  Perception  Perception Tested? [facility])  Praxis  Praxis tested? WFL  Bed Mobility  Overal bed mobility Independent  Transfers  Overall transfer level Independent  Balance  Overall balance assessment Modified Independent  OT - End of Session  Activity Tolerance Patient tolerated treatment well  Patient left in chair;with call bell/phone within reach  Nurse Communication Mobility status;Other (comment) (DC needs)  OT Assessment  OT Recommendation/Assessment Patient needs continued OT Services  OT Visit Diagnosis Low vision, both eyes (H54.2)  OT Problem List Impaired vision/perception  OT Plan  OT Frequency (ACUTE ONLY) Min 2X/week  OT Treatment/Interventions (ACUTE ONLY) Self-care/ADL training;Therapeutic activities;Visual/perceptual remediation/compensation;Patient/family education  AM-PAC OT "6 Clicks" Daily Activity Outcome Measure  Help from another person eating meals? 4  Help from another person taking care of personal grooming? 4  Help from another person toileting, which includes using toliet,  bedpan, or urinal? 4  Help from another person bathing (including washing,  rinsing, drying)? 4  Help from another person to put on and taking off regular upper body clothing? 4  Help from another person to put on and taking off regular lower body clothing? 4  6 Click Score 24  ADL G Code Conversion CH  OT Recommendation  Follow Up Recommendations Outpatient OT;Supervision - Intermittent  OT Equipment None recommended by OT  Individuals Consulted  Consulted and Agree with Results and Recommendations Patient  Acute Rehab OT Goals  Patient Stated Goal home  OT Goal Formulation With patient  Time For Goal Achievement 03/23/17  Potential to Achieve Goals Good  OT Time Calculation  OT Start Time (ACUTE ONLY) 1450  OT Stop Time (ACUTE ONLY) 1510  OT Time Calculation (min) 20 min  OT General Charges  $OT Visit 1 Visit  OT Evaluation  $OT Eval Moderate Complexity 1 Mod  Written Expression  Dominant Hand Right  [facility], OT/L  [phone] 03/09/2017

## 2017-03-09 NOTE — Progress Notes (Signed)
Discharge orders received.  Discharge instructions and follow-up appointments reviewed with the patient and his daughter.  VSS upon discharge.  IV removed and education complete.  Transported out via wheelchair.   Denym Christenberry M, RN 

## 2017-03-09 NOTE — Progress Notes (Signed)
Nutrition Brief Note  Patient identified on the Malnutrition Screening Tool (MST) Report  Wt Readings from Last 15 Encounters:  03/07/17 212 lb 4.9 oz (96.3 kg)  01/22/17 220 lb (99.8 kg)  09/12/16 224 lb (101.6 kg)  08/01/16 225 lb (102.1 kg)  02/28/15 222 lb 9.6 oz (101 kg)  09/08/14 231 lb (104.8 kg)  07/07/13 221 lb (100.2 kg)  07/05/13 220 lb (99.8 kg)  10/19/12 221 lb 4 oz (100.4 kg)  10/13/12 220 lb (99.8 kg)  08/04/12 220 lb (99.8 kg)  08/03/12 220 lb (99.8 kg)  07/14/12 221 lb (100.2 kg)  06/10/12 215 lb (97.5 kg)  04/01/12 221 lb (100.2 kg)    Body mass index is 34.27 kg/m. Patient meets criteria for obese class I based on current BMI.   Current diet order is heart healthy, patient is consuming approximately 100% of meals at this time. Labs and medications reviewed.   No nutrition interventions warranted at this time. If nutrition issues arise, please consult RD.   Satira Anis. Drusilla Wampole, MS, RD LDN Inpatient Clinical Dietitian Pager [phone]

## 2017-03-09 NOTE — Discharge Summary (Addendum)
Physician Discharge Summary  [name] IRS:[id number] DOB: July 16, 1945 DOA: 03/07/2017  PCP: Rosita Fire, MD  Admit date: 03/07/2017 Discharge date: 03/09/2017  Admitted From: home Disposition:  home   Recommendations for Outpatient Follow-up:  1 will need Imdur/ Hydralazine for afterload reduction   Discharge Condition:  stable   CODE STATUS:  Full code   Consultations:  neuro    Discharge Diagnoses:  Principal Problem:   Cerebral thrombosis with cerebral infarction Active Problems:   Hyperlipidemia   Essential hypertension   Arteriosclerotic cardiovascular disease (ASCVD)   Central sleep apnea   Visual field defect of right eye   CKD (chronic kidney disease), stage III (HCC)   Hyperglycemia   OSA (obstructive sleep apnea)    Subjective: No complaints.  Brief Summary: Brian Salvage Walkeris a 71 y.o.malewith medical history significant ofremote cocaine abuse; PE; HLD; HTN; CVA; OSA not on CPAP; and ASCVD s/p CABG in 2011 presenting with right-sided peripheral vision loss  Hospital Course:  Visual field defect of right eye-   Cerebral thrombosis with cerebral infarction - CT head: Multiple areas of chronic infarction - CTA head and neck noted below- no stenosis needed - MRI brain: Acute LEFT PCA territory infarct primarily involving the medial LEFT occipital lobe-  Nonhemorrhagic-Areas of multiple old cerebral and cerebellar infarction CT head: No intracranial or extracranial flow reducing stenosis or occlusion.No arterial dissection is observed. - cont statin  TTE show intraventricular thrombus- started on Eliquis - no PT follow up needed- will need outpt OT.   Active Problems: Chronic systolic CHF - EF on ECHO found to show EF 30-35 % with wall motion abnormalities- he has a h/o CAD s/p CABG - have reviewed ECHO with cardiology, Dr Marlou Porch who called my about the thrombus. At this point the patient is not a candidate for a cath and will need to be followed  up as outpt- he is helping to arrange the appt - he is on Metoprolol already - he is allergic to Lisinopril (tongue swelling?) and therefore have not started ARB either - will need Imdur/ Hydralazine for afterload reduction but in setting of acute CVA do not want to start these yet    Essential hypertension - Metoprolol held to allow for permissive hypertensive -  Metoprolol resumed today    Hyperlipidemia - Lipitor   Mildly elevated Troponin - flat trend- no further work up    CKD (chronic kidney disease), stage III (HCC)     OSA (obstructive sleep apnea) - CPAP  Discharge Instructions  Discharge Instructions    Ambulatory referral to Neurology   Complete by:  As directed    Pt will follow up in 4 weeks. Thanks.   Diet - low sodium heart healthy   Complete by:  As directed    Increase activity slowly   Complete by:  As directed      Allergies as of 03/09/2017      Reactions   Lisinopril    Mouth and tongue swells      Medication List    STOP taking these medications   clopidogrel 75 MG tablet Commonly known as:  PLAVIX     TAKE these medications   apixaban 5 MG Tabs tablet Commonly known as:  ELIQUIS Take 1 tablet (5 mg total) by mouth 2 (two) times daily.   atorvastatin 40 MG tablet Commonly known as:  LIPITOR Take 1 tablet (40 mg total) by mouth daily at 6 PM.   cycloSPORINE 0.05 % ophthalmic emulsion  Commonly known as:  RESTASIS Place 1 drop into both eyes daily as needed.   fluticasone 50 MCG/ACT nasal spray Commonly known as:  FLONASE Place 2 sprays into both nostrils 2 (two) times daily as needed for allergies or rhinitis.   metoprolol succinate 50 MG 24 hr tablet Commonly known as:  TOPROL-XL TAKE 1 TABLET (50 MG TOTAL) BY MOUTH DAILY. TAKE WITH OR IMMEDIATELY FOLLOWING A MEAL.   nitroGLYCERIN 0.4 MG SL tablet Commonly known as:  NITROSTAT Place 1 tablet (0.4 mg total) under the tongue every 5 (five) minutes as needed for chest pain. up to  3 doses.   pravastatin 20 MG tablet Commonly known as:  PRAVACHOL TAKE 1 TABLET BY MOUTH EVERY EVENING      Follow-up Information    Phillips Odor, MD. Schedule an appointment as soon as possible for a visit in 4 week(s).   Specialty:  Neurology Contact information: [address] [place] [address] [phone]          Allergies  Allergen Reactions  . Lisinopril     Mouth and tongue swells     Procedures/Studies: 2 D ECHO Study Conclusions  - Left ventricle: The cavity size was normal. Wall thickness was   increased in a pattern of mild LVH. Systolic function was   moderately to severely reduced. The estimated ejection fraction   was in the range of 30% to 35%. There is akinesis of the   mid-apicalanteroseptal, inferolateral, inferoseptal, and apical   myocardium. Doppler parameters are consistent with abnormal left   ventricular relaxation (grade 1 diastolic dysfunction). There was   a 1.5 cm (L) x 1.0 cm (W), fixed, apicalthrombusassociated with a   hypokinetic segment. - Left atrium: The atrium was mildly dilated. Volume/bsa, ES   (1-plane Simpson&'s, A4C): 34.3 ml/m^2.  Impressions:  - Apical thrombus noted.  Ct Angio Head W Or Wo Contrast  Result Date: 03/07/2017 CLINICAL DATA:  Code stroke. Visual difficulty. RIGHT-sided weakness. EXAM: CT ANGIOGRAPHY HEAD AND NECK TECHNIQUE: Multidetector CT imaging of the head and neck was performed using the standard protocol during bolus administration of intravenous contrast. Multiplanar CT image reconstructions and MIPs were obtained to evaluate the vascular anatomy. Carotid stenosis measurements (when applicable) are obtained utilizing NASCET criteria, using the distal internal carotid diameter as the denominator. CONTRAST:  78mL ISOVUE-370 IOPAMIDOL (ISOVUE-370) INJECTION 76% COMPARISON:  Noncontrast CT head earlier.  MR head 06/07/2012. FINDINGS: CTA NECK FINDINGS Aortic arch: Standard branching. Imaged  portion shows no evidence of aneurysm or dissection. No significant stenosis of the major arch vessel origins. Right carotid system: No evidence of dissection, stenosis (50% or greater) or occlusion.Minor calcific plaque at the bifurcation Left carotid system: No evidence of dissection, stenosis (50% or greater) or occlusion. Minor calcific plaque at the bifurcation. Vertebral arteries: LEFT vertebral dominant. No evidence of dissection, stenosis, or occlusion. Minor ostial calcification on the RIGHT. Skeleton: Mild spondylosis.  No worrisome osseous lesions. Other neck: No masses. Upper chest: Emphysema. Review of the MIP images confirms the above findings CTA HEAD FINDINGS Anterior circulation: No significant stenosis, proximal occlusion, aneurysm, or vascular malformation. Posterior circulation: No significant stenosis, proximal occlusion, aneurysm, or vascular malformation. Venous sinuses: As permitted by contrast timing, patent. Anatomic variants: None. Delayed phase: No abnormal postcontrast enhancement. Review of the MIP images confirms the above findings IMPRESSION: No intracranial or extracranial flow reducing stenosis or occlusion. No arterial dissection is observed. Emphysema (ICD10-J43.9). Electronically Signed   By: Roderic Ovens.D.  On: 03/07/2017 15:41   Dg Chest 2 View  Result Date: 03/08/2017 CLINICAL DATA:  TIA EXAM: CHEST  2 VIEW COMPARISON:  03/22/2012 FINDINGS: Cardiomegaly. Prior CABG. Vascular congestion. Mild interstitial prominence may reflect interstitial edema. No effusions. IMPRESSION: Cardiomegaly. Vascular congestion with suspected mild interstitial edema. Electronically Signed   By: Rolm Baptise M.D.   On: 03/08/2017 07:53   Ct Angio Neck W Or Wo Contrast  Result Date: 03/07/2017 CLINICAL DATA:  Code stroke. Visual difficulty. RIGHT-sided weakness. EXAM: CT ANGIOGRAPHY HEAD AND NECK TECHNIQUE: Multidetector CT imaging of the head and neck was performed using the standard  protocol during bolus administration of intravenous contrast. Multiplanar CT image reconstructions and MIPs were obtained to evaluate the vascular anatomy. Carotid stenosis measurements (when applicable) are obtained utilizing NASCET criteria, using the distal internal carotid diameter as the denominator. CONTRAST:  20mL ISOVUE-370 IOPAMIDOL (ISOVUE-370) INJECTION 76% COMPARISON:  Noncontrast CT head earlier.  MR head 06/07/2012. FINDINGS: CTA NECK FINDINGS Aortic arch: Standard branching. Imaged portion shows no evidence of aneurysm or dissection. No significant stenosis of the major arch vessel origins. Right carotid system: No evidence of dissection, stenosis (50% or greater) or occlusion.Minor calcific plaque at the bifurcation Left carotid system: No evidence of dissection, stenosis (50% or greater) or occlusion. Minor calcific plaque at the bifurcation. Vertebral arteries: LEFT vertebral dominant. No evidence of dissection, stenosis, or occlusion. Minor ostial calcification on the RIGHT. Skeleton: Mild spondylosis.  No worrisome osseous lesions. Other neck: No masses. Upper chest: Emphysema. Review of the MIP images confirms the above findings CTA HEAD FINDINGS Anterior circulation: No significant stenosis, proximal occlusion, aneurysm, or vascular malformation. Posterior circulation: No significant stenosis, proximal occlusion, aneurysm, or vascular malformation. Venous sinuses: As permitted by contrast timing, patent. Anatomic variants: None. Delayed phase: No abnormal postcontrast enhancement. Review of the MIP images confirms the above findings IMPRESSION: No intracranial or extracranial flow reducing stenosis or occlusion. No arterial dissection is observed. Emphysema (ICD10-J43.9). Electronically Signed   By: Staci Righter M.D.   On: 03/07/2017 15:41   Mr Brain Wo Contrast  Result Date: 03/08/2017 CLINICAL DATA:  Sudden onset of visual change 03/06/2017. Flashing lights were followed by obscuration  of the RIGHT lower visual field. Stroke risk factors include multiple prior strokes, hypertension, and hyperlipidemia. History of substance abuse. EXAM: MRI HEAD WITHOUT CONTRAST TECHNIQUE: Multiplanar, multiecho pulse sequences of the brain and surrounding structures were obtained without intravenous contrast. COMPARISON:  CT head without contrast and CTA head neck 03/07/2017. FINDINGS: Brain: Restricted diffusion involves the LEFT occipital lobe, primarily calcarine cortex, extending toward the medial and posterior temporal lobe, corresponding low ADC, consistent with acute infarction. No other areas of acute stroke are observed. No hemorrhage, mass lesion, hydrocephalus, or extra-axial fluid. Generalized atrophy. Mild to moderate subcortical and periventricular T2 and FLAIR hyperintensities, likely chronic microvascular ischemic change. Multiple areas of chronic infarction are observed throughout the brain, most notable LEFT frontal region, also RIGHT inferior cerebellum. Lacunar infarct LEFT thalamus is chronic. Prominent perivascular spaces. Vascular: Flow voids are maintained in the carotid, basilar, and vertebral arteries. No LEFT PCA occlusion is evident. Skull and upper cervical spine: Normal marrow signal. Sinuses/Orbits: Chronic sinus disease.  No orbital findings. Other: Compared with yesterday's CT, the infarct is not visible. IMPRESSION: Acute LEFT PCA territory infarct primarily involving the medial LEFT occipital lobe. This is nonhemorrhagic. Atrophy, small vessel disease, and areas of multiple old cerebral and cerebellar infarction. No associated large vessel occlusion. Proximal LEFT PCA demonstrates patency on  MRA and CTA. Electronically Signed   By: Staci Righter M.D.   On: 03/08/2017 07:44   Ct Head Code Stroke Wo Contrast  Result Date: 03/07/2017 CLINICAL DATA:  Code stroke. RIGHT-sided weakness. Possible visual field defect. EXAM: CT HEAD WITHOUT CONTRAST TECHNIQUE: Contiguous axial images  were obtained from the base of the skull through the vertex without intravenous contrast. COMPARISON:  CT head 06/06/2012.  MR brain 06/07/2012. FINDINGS: Brain: No evidence for acute infarction, hemorrhage, mass lesion, hydrocephalus, or extra-axial fluid. Mild to moderate cerebral and cerebellar atrophy. Multiple chronic areas of cerebral infarction are seen including LEFT frontal, RIGHT occipital, LEFT thalamus, and RIGHT cerebellum. Vascular: Calcification of the cavernous internal carotid arteries consistent with cerebrovascular atherosclerotic disease. No signs of intracranial large vessel occlusion. Skull: Normal. Negative for fracture or focal lesion. Sinuses/Orbits: No acute finding. Other: None. ASPECTS [facility] Stroke Program Early CT Score) - Ganglionic level infarction (caudate, lentiform nuclei, internal capsule, insula, M1-M3 cortex): 7 - Supraganglionic infarction (M4-M6 cortex): 3 Total score (0-10 with 10 being normal): 10 IMPRESSION: 1. Atrophy and small vessel disease. Multiple areas of chronic infarction. No acute intracranial findings are observed. Similar appearance to priors. 2. ASPECTS is 10. These results were called by telephone at the time of interpretation on 03/07/2017 at 3:07 pm to Dr. Nanda Quinton , who verbally acknowledged these results. Electronically Signed   By: Staci Righter M.D.   On: 03/07/2017 15:15       Discharge Exam: Vitals:   03/09/17 0917 03/09/17 1507  BP: 132/83 (!) 147/68  Pulse: 79 70  Resp: 18 18  Temp: 97.6 F (36.4 C) 98.9 F (37.2 C)  SpO2: 97% 98%   Vitals:   03/09/17 0124 03/09/17 0417 03/09/17 0917 03/09/17 1507  BP: (!) 116/58 (!) 141/79 132/83 (!) 147/68  Pulse: 80 81 79 70  Resp: 18 18 18 18   Temp: 98.3 F (36.8 C) 98.8 F (37.1 C) 97.6 F (36.4 C) 98.9 F (37.2 C)  TempSrc: Oral Oral Oral Oral  SpO2: 98% 97% 97% 98%  Weight:      Height:        General: Pt is alert, awake, not in acute distress Cardiovascular: RRR, S1/S2 +,  no rubs, no gallops Respiratory: CTA bilaterally, no wheezing, no rhonchi Abdominal: Soft, NT, ND, bowel sounds + Extremities: no edema, no cyanosis Neuro: right hemionopsia    The results of significant diagnostics from this hospitalization (including imaging, microbiology, ancillary and laboratory) are listed below for reference.     Microbiology: No results found for this or any previous visit (from the past 240 hour(s)).   Labs: BNP (last 3 results) No results for input(s): BNP in the last 8760 hours. Basic Metabolic Panel: Recent Labs  Lab 03/07/17 1457 03/07/17 1513  NA 139 141  K 4.6 4.3  CL 106 103  CO2 29  --   GLUCOSE 116* 110*  BUN 14 14  CREATININE 1.46* 1.40*  CALCIUM 9.0  --    Liver Function Tests: Recent Labs  Lab 03/07/17 1457  AST 22  ALT 21  ALKPHOS 59  BILITOT 1.2  PROT 7.7  ALBUMIN 4.0   No results for input(s): LIPASE, AMYLASE in the last 168 hours. No results for input(s): AMMONIA in the last 168 hours. CBC: Recent Labs  Lab 03/07/17 1457 03/07/17 1513  WBC 7.1  --   NEUTROABS 3.5  --   HGB 14.9 17.0  HCT 46.6 50.0  MCV 87.4  --  PLT 142*  --    Cardiac Enzymes: Recent Labs  Lab 03/07/17 1505 03/07/17 2114 03/08/17 0216 03/08/17 0823  TROPONINI 0.04* 0.03* 0.03* 0.03*   BNP: Invalid input(s): POCBNP CBG: No results for input(s): GLUCAP in the last 168 hours. D-Dimer No results for input(s): DDIMER in the last 72 hours. Hgb A1c No results for input(s): HGBA1C in the last 72 hours. Lipid Profile Recent Labs    03/09/17 0458  CHOL 116  HDL 43  LDLCALC 58  TRIG 76  CHOLHDL 2.7   Thyroid function studies Recent Labs    03/07/17 1457  TSH 1.370   Anemia work up No results for input(s): VITAMINB12, FOLATE, FERRITIN, TIBC, IRON, RETICCTPCT in the last 72 hours. Urinalysis    Component Value Date/Time   COLORURINE STRAW (A) 03/07/2017 1446   APPEARANCEUR CLEAR 03/07/2017 1446   LABSPEC 1.032 (H) 03/07/2017  1446   PHURINE 7.0 03/07/2017 1446   GLUCOSEU NEGATIVE 03/07/2017 1446   HGBUR NEGATIVE 03/07/2017 1446   BILIRUBINUR NEGATIVE 03/07/2017 1446   KETONESUR NEGATIVE 03/07/2017 1446   PROTEINUR 30 (A) 03/07/2017 1446   UROBILINOGEN 1.0 03/16/2010 0652   NITRITE NEGATIVE 03/07/2017 1446   LEUKOCYTESUR NEGATIVE 03/07/2017 1446   Sepsis Labs Invalid input(s): PROCALCITONIN,  WBC,  LACTICIDVEN Microbiology No results found for this or any previous visit (from the past 240 hour(s)).   Time coordinating discharge: Over 30 minutes  SIGNED:   Debbe Odea, MD  [facility] 03/09/2017, 5:23 PM Pager   If 7PM-7AM, please contact night-coverage [email or web address] Password [email or web address]

## 2017-03-09 NOTE — Progress Notes (Signed)
SLP Cancellation Note  Patient Details Name: [name] MRN: [id number] DOB: [birth year]   Cancelled treatment:       Reason Eval/Treat Not Completed: SLP screened, no needs identified, will sign off   Ryler Laskowski, Katherene Ponto 03/09/2017, 1:29 PM

## 2017-03-09 NOTE — Progress Notes (Signed)
STROKE TEAM PROGRESS NOTE   SUBJECTIVE (INTERVAL HISTORY) No family at bedside. He was found on TTE with low EF at 30-35% and apical thrombus. He was put on eliquis. He has cardiology follow up with Dr. Bronson [name] at [place]. He also follows with Dr. Merlene [name] for stroke and OSA.    OBJECTIVE Temp:  [97.6 F (36.4 C)-98.8 F (37.1 C)] 97.6 F (36.4 C) (12/03 0917) Pulse Rate:  [68-85] 79 (12/03 0917) Cardiac Rhythm: Normal sinus rhythm (12/03 0740) Resp:  [16-18] 18 (12/03 0917) BP: (116-151)/(58-86) 132/83 (12/03 0917) SpO2:  [96 %-98 %] 97 % (12/03 0917) FiO2 (%):  [21 %] 21 % (12/02 2231)  CBC:  Recent Labs  Lab 03/07/17 1457 03/07/17 1513  WBC 7.1  --   NEUTROABS 3.5  --   HGB 14.9 17.0  HCT 46.6 50.0  MCV 87.4  --   PLT 142*  --     Basic Metabolic Panel:  Recent Labs  Lab 03/07/17 1457 03/07/17 1513  NA 139 141  K 4.6 4.3  CL 106 103  CO2 29  --   GLUCOSE 116* 110*  BUN 14 14  CREATININE 1.46* 1.40*  CALCIUM 9.0  --     Lipid Panel:     Component Value Date/Time   CHOL 116 03/09/2017 0458   TRIG 76 03/09/2017 0458   HDL 43 03/09/2017 0458   CHOLHDL 2.7 03/09/2017 0458   VLDL 15 03/09/2017 0458   LDLCALC 58 03/09/2017 0458   HgbA1c:  Lab Results  Component Value Date   HGBA1C (H) 07/26/2010    5.8 (NOTE)                                                                       According to the ADA Clinical Practice Recommendations for 2011, when HbA1c is used as a screening test:   >=6.5%   Diagnostic of Diabetes Mellitus           (if abnormal result  is confirmed)  5.7-6.4%   Increased risk of developing Diabetes Mellitus  References:Diagnosis and Classification of Diabetes Mellitus,Diabetes WFUX,3235,57(DUKGU 1):S62-S69 and Standards of Medical Care in         Diabetes - 2011,Diabetes Care,2011,34  (Suppl 1):S11-S61.   Urine Drug Screen:     Component Value Date/Time   LABOPIA NONE DETECTED 03/07/2017 1446   COCAINSCRNUR NONE DETECTED 03/07/2017 1446    LABBENZ NONE DETECTED 03/07/2017 1446   AMPHETMU NONE DETECTED 03/07/2017 1446   THCU NONE DETECTED 03/07/2017 1446   LABBARB NONE DETECTED 03/07/2017 1446    Alcohol Level     Component Value Date/Time   ETH <10 03/07/2017 1457    IMAGING I have personally reviewed the radiological images below and agree with the radiology interpretations.  Ct Angio Head W Or Wo Contrast Ct Angio Neck W Or Wo Contrast 03/07/2017 IMPRESSION:  No intracranial or extracranial flow reducing stenosis or occlusion.  No arterial dissection is observed.  Emphysema (ICD10-J43.9).   Dg Chest 2 View 03/08/2017 IMPRESSION:  Cardiomegaly. Vascular congestion with suspected mild interstitial edema.   Mr Brain Wo Contrast 03/08/2017 IMPRESSION:  Acute LEFT PCA territory infarct primarily involving the medial LEFT occipital lobe. This is nonhemorrhagic. Atrophy, small vessel disease, and  areas of multiple old cerebral and cerebellar infarction. No associated large vessel occlusion. Proximal LEFT PCA demonstrates patency on MRA and CTA.  Ct Head Code Stroke Wo Contrast 03/07/2017 IMPRESSION:  1. Atrophy and small vessel disease. Multiple areas of chronic infarction. No acute intracranial findings are observed. Similar appearance to priors.  2. ASPECTS is 10.   TTE - Left ventricle: The cavity size was normal. Wall thickness was   increased in a pattern of mild LVH. Systolic function was   moderately to severely reduced. The estimated ejection fraction   was in the range of 30% to 35%. There is akinesis of the   mid-apicalanteroseptal, inferolateral, inferoseptal, and apical   myocardium. Doppler parameters are consistent with abnormal left   ventricular relaxation (grade 1 diastolic dysfunction). There was   a 1.5 cm (L) x 1.0 cm (W), fixed, apicalthrombusassociated with a   hypokinetic segment. - Left atrium: The atrium was mildly dilated. Volume/bsa, ES   (1-plane Simpson&'s, A4C): 34.3  ml/m^2. Impressions: - Apical thrombus noted.   PHYSICAL EXAM Vitals:   03/08/17 2231 03/09/17 0124 03/09/17 0417 03/09/17 0917  BP:  (!) 116/58 (!) 141/79 132/83  Pulse: 70 80 81 79  Resp: 16 18 18 18   Temp:  98.3 F (36.8 C) 98.8 F (37.1 C) 97.6 F (36.4 C)  TempSrc:  Oral Oral Oral  SpO2: 96% 98% 97% 97%  Weight:      Height:       Pleasant elderly African-American male currently not in distress. . Afebrile. Head is nontraumatic. Neck is supple without bruit.    Cardiac exam no murmur or gallop. Lungs are clear to auscultation. Distal pulses are well felt. Neurological Exam :  Awake alert oriented 3 with normal speech and language function. No aphasia, dysarthria. Follows commands well. Extraocular moments are full range without nystagmus. Right lower quadrantanopia. Fundi not visualized. Vision acuity seems adequate. Mild left nasolabial fold flattening but symmetrical smile. Tongue midline. Motor system exam reveals no upper or lower extremity drift. Symmetric and equal strength in all 4 extremities. Mild diminished fine finger movements on the right. Orbits left over right upper extremity. Sensation intact. Coordination accurate. Gait deferred.  ASSESSMENT/PLAN [name] is a 71 y.o. male with history of substance abuse, PE, previous strokes in 2014, hyperlipidemia, and CAD with previous MI in 2004 and s/p CABG in 2011 presenting with visual changes in his right eye. He did not receive IV t-PA due to late presentation.  Stroke:  Left PCA infarct, embolic pattern, likely due to apical thrombus with low EF. Potential undiagnosed paroxysmal afib can not be excluded.  Resultant right lower quadrantanopia  CT head - Atrophy and small vessel disease. Multiple areas of chronic infarction.  MRI head - Acute LEFT PCA territory infarct, and old right cerebellar infarct  CTA H&N - No intracranial or extracranial flow reducing stenosis or occlusion.   Carotid Doppler - CTA  neck  2D Echo - EF 30-35% with apical thrombus  LDL - 58  HgbA1c - pending  VTE prophylaxis - Lovenox Diet Heart Room service appropriate? Yes; Fluid consistency: Thin  clopidogrel 75 mg daily prior to admission, now on eliquis 5mg  bid. Continue eliquis on discharge.  Patient counseled to be compliant with his antithrombotic medications  Ongoing aggressive stroke risk factor management  Therapy recommendations:  pending  Disposition:  Pending  Apical thrombus with cardiomyopathy  TTE - EF 30-35% with apical thrombus  On eliquis now  Has cardiology follow  up with Dr. Bronson [name] as outpt  Pt needs continue follow up with his cardiology and may consider cardiac monitoring to rule out afib  History of stroke  06/2012 - right PCA/MCA and PCA punctate infarcts, MRA, CUS, TTE all unremarkable - put on plavix and pravastatin  Follows with Dr. Merlene [name] at [facility]  Hypertension  Stable gradually normalize in 5-7 days Long-term BP goal normotensive  Hyperlipidemia  Home meds:  Pravachol 20 mg   LDL 58, goal < 70  Now on lipitor 40  Continue statin at discharge  Other Stroke Risk Factors  Advanced age  ETOH use, advised to drink no more than 1 drink per day.  Obesity, Body mass index is 34.27 kg/m., recommend weight loss, diet and exercise as appropriate   CAD/MI s/p CABG  Hx of PE  Other Active Problems  Elevated creat - 1.40  Continue follow up with Dr. Merlene [name]  Neurology will sign off. Please call with questions. Pt will follow up with Dr. Loralie Champagne in [place] in about 6 weeks. Thanks for the consult.  Brian Hawking, MD PhD Stroke Neurology 03/09/2017 12:40 PM   To contact Stroke Continuity provider, please refer to [email or web address] After hours, contact General Neurology

## 2017-03-10 DIAGNOSIS — L989 Disorder of the skin and subcutaneous tissue, unspecified: Secondary | ICD-10-CM | POA: Diagnosis not present

## 2017-03-10 DIAGNOSIS — I639 Cerebral infarction, unspecified: Secondary | ICD-10-CM | POA: Diagnosis not present

## 2017-03-10 DIAGNOSIS — L720 Epidermal cyst: Secondary | ICD-10-CM | POA: Diagnosis not present

## 2017-03-10 DIAGNOSIS — H40003 Preglaucoma, unspecified, bilateral: Secondary | ICD-10-CM | POA: Diagnosis not present

## 2017-03-10 LAB — HEMOGLOBIN A1C
Hgb A1c MFr Bld: 5.6 % (ref 4.8–5.6)
Mean Plasma Glucose: 114 mg/dL

## 2017-03-10 NOTE — Care Management Note (Signed)
Case Management Note  Patient Details  Name: [name] MRN: [id number] Date of Birth: 12-15-1945  Subjective/Objective:                    Action/Plan: 03/10/2017 at 0912 am:: Pt discharged home late yesterday. CM consulted for outpatient rehab for OT. Pt lives in [place]. Orders placed for outpatient rehab at [facility]. Information on the AVS last night.  Pt had transportation home.   Expected Discharge Date:  03/09/17               Expected Discharge Plan:  OP Rehab  In-House Referral:     Discharge planning Services  CM Consult  Post Acute Care Choice:    Choice offered to:     DME Arranged:    DME Agency:     HH Arranged:    HH Agency:     Status of Service:  Completed, signed off  If discussed at [employer] of Stay Meetings, dates discussed:    Additional Comments:  Pollie Friar, RN 03/10/2017, 9:11 AM

## 2017-03-11 NOTE — Consult Note (Signed)
            [facility] CM Primary Care Navigator  03/11/2017  [name] 04-Apr-1946 [id number]    Attemptto seepatient at the bedside to identify possible discharge needs but he was alreadydischargedhome per staff.   Primary care provider's office called (spoke to [place]) to notify of patient's discharge and need for post hospital follow-up and transition of care. Notified of health issues needing follow-up.  Made aware to refer patient to [facility] care management if deemed necessary or appropriate for services.   For additional questions please contact:  Edwena Felty A. Kashius Dominic, BSN, RN-BC [facility] PRIMARY CARE Navigator Cell: [phone]

## 2017-03-16 ENCOUNTER — Ambulatory Visit (HOSPITAL_COMMUNITY): Payer: Medicare HMO | Attending: Internal Medicine | Admitting: Specialist

## 2017-03-16 DIAGNOSIS — I2510 Atherosclerotic heart disease of native coronary artery without angina pectoris: Secondary | ICD-10-CM | POA: Diagnosis not present

## 2017-03-16 DIAGNOSIS — I69318 Other symptoms and signs involving cognitive functions following cerebral infarction: Secondary | ICD-10-CM | POA: Insufficient documentation

## 2017-03-16 DIAGNOSIS — G4733 Obstructive sleep apnea (adult) (pediatric): Secondary | ICD-10-CM | POA: Diagnosis not present

## 2017-03-16 DIAGNOSIS — I10 Essential (primary) hypertension: Secondary | ICD-10-CM | POA: Diagnosis not present

## 2017-03-17 ENCOUNTER — Encounter: Payer: Self-pay | Admitting: *Deleted

## 2017-03-17 ENCOUNTER — Telehealth (HOSPITAL_COMMUNITY): Payer: Self-pay | Admitting: Internal Medicine

## 2017-03-17 ENCOUNTER — Other Ambulatory Visit: Payer: Self-pay | Admitting: *Deleted

## 2017-03-17 NOTE — Patient Outreach (Addendum)
[facility]) Care Management  03/17/2017  [name] 1945/11/05 [id number]   EMMI- Stroke RED ON EMMI ALERT DAY#: 6 DATE: 03/16/17 RED ALERT: Smoked or been around smoke? Yes  Outreach attempt # 1 to patient. HIPAA verified with patient. Patient gave permission to speak with [facility] (friend). [name] stated, patient has been staying at her house after being discharged from the hospital. [facility] is assisting patient with his medical care. [name] confirmed answering EMMI automated questionnaire for patient. She reported, "she smokes outside of her home and not around the patient". [name] stated, patient received, read, and understood his discharge paperwork. Per [facility], patient is taking his medications as prescribed, including Apixaban, Atorvastatin, and Metoprolol. [name] discussed how she prepares patient's meals with no added salt, no fried foods, and always healthy. She was unable to remember patient's MD appointments. She plans to call RN CM back with details about patient's MD appointments. [name] was able to verbalize patient's signs/symptoms of a stroke. Patient doesn't monitor his blood pressure due to not having a blood pressure monitor at home. [name] explained, patient had a mole/cyst in the right corner of his eye and it was removed during patient's hospital stay. [name] stated, she is providing wound care for patient.    Update @ 1425: Incoming phone call from [place]. [name] provided patient's MD appointment scheduled on 03/19/17 with primary MD. She stated, patient will begin outpatient PT on 03/1312. Patient's neurology appointment is scheduled on 04/02/17.    Plan: RN CM will notify [facility] Case Management Assistant regarding case closure.  RN CM will send successful outreach letter to patient. RN CM advised patient to contact RN CM for any needs or concerns. RN CM will send patient EMMI educational materials. RN CM will send patient Stroke magnet.   Brian Bells, RN, BSN,  MHA/MSL, [place] Telephonic Care Manager Coordinator [facility] Direct Phone: [phone] Toll Free: [phone] Fax: [phone]

## 2017-03-17 NOTE — Telephone Encounter (Signed)
03/17/17  I called to see about rescheduling and spoke to the patient.  He said he would call back because he isn't sure when his drs appt is.

## 2017-03-19 ENCOUNTER — Other Ambulatory Visit: Payer: Self-pay

## 2017-03-19 ENCOUNTER — Ambulatory Visit (HOSPITAL_COMMUNITY): Payer: Self-pay

## 2017-03-19 ENCOUNTER — Ambulatory Visit (HOSPITAL_COMMUNITY): Payer: Medicare HMO | Admitting: Occupational Therapy

## 2017-03-19 ENCOUNTER — Encounter (HOSPITAL_COMMUNITY): Payer: Self-pay | Admitting: Occupational Therapy

## 2017-03-19 DIAGNOSIS — I10 Essential (primary) hypertension: Secondary | ICD-10-CM | POA: Diagnosis not present

## 2017-03-19 DIAGNOSIS — I69318 Other symptoms and signs involving cognitive functions following cerebral infarction: Secondary | ICD-10-CM | POA: Diagnosis not present

## 2017-03-19 DIAGNOSIS — N183 Chronic kidney disease, stage 3 (moderate): Secondary | ICD-10-CM | POA: Diagnosis not present

## 2017-03-19 DIAGNOSIS — I2510 Atherosclerotic heart disease of native coronary artery without angina pectoris: Secondary | ICD-10-CM | POA: Diagnosis not present

## 2017-03-19 DIAGNOSIS — I669 Occlusion and stenosis of unspecified cerebral artery: Secondary | ICD-10-CM | POA: Diagnosis not present

## 2017-03-19 NOTE — Therapy (Signed)
[name], [place], [address] Phone: [phone]   Fax:  [phone]  Occupational Therapy Evaluation  Patient Details  Name: [name] MRN: [id number] Date of Birth: [birth year] No Data Recorded  Encounter Date: 03/19/2017  OT End of Session - 03/19/17 1744    Visit Number  1    Number of Visits  1    Date for OT Re-Evaluation  03/21/17    Authorization Type  [employer] Medicare; Medicaid    Authorization Time Period  Before 10th visit    Authorization - Visit Number  1    Authorization - Number of Visits  10    OT Start Time  6967    OT Stop Time  1427    OT Time Calculation (min)  34 min    Activity Tolerance  Patient tolerated treatment well    Behavior During Therapy  [facility] for tasks assessed/performed       Past Medical History:  Diagnosis Date  . Arteriosclerotic cardiovascular disease (ASCVD) 1998   Inferior MI->PCI of the RCA in 1998; acute anterior MI in 06/2002->  PCI of RCA and LAD with DES x2 to RCA, residual 80% ostial D1, and 80% mid CX and EF-40%; CABG-2011, LIMA-LAD, SVG to D1, OM1 & OM2; EF of 35-40% in 07/2010  . Chronic anticoagulation   . Hyperlipidemia   . Keloid    median sternotomy  . Pulmonary embolism ([place]) 03/2010  . 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)    formerly cocaine    Past Surgical History:  Procedure Laterality Date  . COLONOSCOPY N/A 01/22/2017   Procedure: COLONOSCOPY;  Surgeon: Danie Binder, MD;  Location: [facility] ENDO SUITE;  Service: Endoscopy;  Laterality: N/A;  200  . CORONARY ARTERY BYPASS GRAFT  03/18/2010   LIMA-LAD, SVG to diagonal, OM1 & OM2  . POLYPECTOMY  01/22/2017   Procedure: POLYPECTOMY;  Surgeon: Danie Binder, MD;  Location: [facility] ENDO SUITE;  Service: Endoscopy;;  Transverse(CS) and sigmoid colon(HS)  . PTCA  06/1996   LAD & RCA  . TEE WITHOUT CARDIOVERSION N/A 06/10/2012   Procedure: TRANSESOPHAGEAL ECHOCARDIOGRAM (TEE);  Surgeon: Josue Hector, MD;  Location: [facility]  ENDO SUITE;  Service: Cardiovascular;  Laterality: N/A;    There were no vitals filed for this visit.  Subjective Assessment - 03/19/17 1745    Subjective   S: I think my vision is getting better, I can see shapes now.     Pertinent History  Pt is a 71 y/o male s/p left CVA in the PCA territory on 03/07/17. Pt received OT services while in acute care at [facility], was referred to for additional evaluation and treatment by Dr. Debbe Odea.     Patient Stated Goals  To be able to see well enough to not run into things.     Currently in Pain?  No/denies        [facility] OT Assessment - 03/19/17 1356      Assessment   Medical Diagnosis  s/p CVA    Referring Provider  Dr. Debbe Odea    Onset Date/Surgical Date  03/07/17    Hand Dominance  Right    Prior Therapy  Acute OT at [facility]      Precautions   Precautions  None      Restrictions   Weight Bearing Restrictions  No      Balance Screen   Has the patient fallen in the past 6 months  No  Has the patient had a decrease in activity level because of a fear of falling?   No    Is the patient reluctant to leave their home because of a fear of falling?   No      Home  Environment   Family/patient expects to be discharged to:  Private residence    Living Arrangements  Alone    Available Help at Discharge  Family    Type of [place]      Prior Function   Level of [place] with basic ADLs    Vocation  On disability    Leisure  fishing, walking      ADL   ADL comments  Pt has aide to assist with housework and meal preparation. Pt is independent in ADLs, has difficulty seeing on his right side which causes difficulty with reading and pt is unable to drive.       Written Expression   Dominant Hand  Right      Vision - History   Baseline Vision  Wears glasses all the time has bifocals    Patient Visual Report  Peripheral vision impairment    Additional Comments  Pt reports peripheral vision is poor, can see shadows and  shapes      Vision Assessment   Eye Alignment  Within Functional Limits    Vision Assessment  Vision tested    Ocular Range of Motion  Within Functional Limits    Alignment/Gaze Preference  Within Defined Limits    Tracking/Visual Pursuits  Able to track stimulus in all quads without difficulty    Saccades  Within functional limits    Convergence  Within functional limits    Visual Fields  Right visual field deficit Able to see objects at 45 degrees; shadows 50-60 degrees     Acuity  Able to read clock/calendar on wall without difficulty    Patient has diffculty with activities due to visual impairment  Reading bills;Balancing checkbook;Writing checks    Comment  ER cancellation test: 100%      Cognition   Overall Cognitive Status  Within Functional Limits for tasks assessed      Sensation   Light Touch  Appears Intact    Stereognosis  Appears Intact    Hot/Cold  Appears Intact    Proprioception  Appears Intact      Coordination   Gross Motor Movements are Fluid and Coordinated  Yes    Fine Motor Movements are Fluid and Coordinated  Yes      ROM / Strength   AROM / PROM / Strength  AROM;Strength      AROM   Overall AROM   Within functional limits for tasks performed      Strength   Overall Strength  Within functional limits for tasks performed                                  Plan - 03/19/17 1745    Clinical Impression Statement  A: Pt is a 71 y/o male s/p CVA on 03/07/17 presenting for evaluation of visual deficits. Pt is independent in ADL tasks, no unilateral weakness/numbness or coordination deficits. Pt reports he is able to see shadows now. On evaluation pt has peripheral vision from 0-45 degrees, is able to see some shadows from 50-60 degrees. Educated pt on compensatory strategies for current visual status, which pt is already using and  verbalized understanding. No further OT services required at this time.     Occupational Profile and client  history currently impacting functional performance  Pt is highly independent and motivated to maintain independence.     Occupational performance deficits (Please refer to evaluation for details):  Other peripheral vision deficits    Rehab Potential  Good    OT Frequency  One time visit    OT Treatment/Interventions  Patient/family education    Plan  P: pt does not require any further OT services at this time.     Clinical Decision Making  Limited treatment options, no task modification necessary    OT Home Exercise Plan  [place] handout    Consulted and Agree with Plan of Care  Patient       Patient will benefit from skilled therapeutic intervention in order to improve the following deficits and impairments:  Impaired vision/preception  Visit Diagnosis: Other symptoms and signs involving cognitive functions following cerebral infarction  G-Codes - Apr 15, 2017 1752    Functional Assessment Tool Used (Outpatient only)  clinical judgement    Functional Limitation  Self care    Self Care Current Status (A8341)  At least 1 percent but less than 20 percent impaired, limited or restricted    Self Care Goal Status (D6222)  At least 1 percent but less than 20 percent impaired, limited or restricted    Self Care Discharge Status [phone])  At least 1 percent but less than 20 percent impaired, limited or restricted       Problem List Patient Active Problem List   Diagnosis Date Noted  . Cerebral thrombosis with cerebral infarction 03/08/2017  . TIA (transient ischemic attack) 03/08/2017  . Visual field defect of right eye 03/07/2017  . CKD (chronic kidney disease), stage III ([place]) 03/07/2017  . Hyperglycemia 03/07/2017  . OSA (obstructive sleep apnea) 03/07/2017  . Special screening for malignant neoplasms, colon   . 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 Chronic anticoagulation-discontinued 10/19/2012  . CVA (cerebral infarction) 06/09/2012  . Arteriosclerotic cardiovascular disease (ASCVD)   .  Pulmonary embolism ([place]) 03/07/2010  . Hyperlipidemia 11/24/2008  . Essential hypertension 11/24/2008   Guadelupe Sabin, OTR/L  [phone] 15-Apr-2017, 5:53 PM  [facility] [address] [place], [place], [address] Phone: [phone]   Fax:  [phone]  Name: JOHNDANIEL CATLIN MRN: [id number] Date of Birth: 14-Apr-1945

## 2017-03-19 NOTE — Patient Instructions (Signed)
Tips and Strategies for Low Vision  1) Turn your head to the right to see the whole environment or when in unfamiliar places  2) When reading look to the right to find the end of the page, this way you'll know that you have read the entire section/paragraph/etc  3) Make sure you have good light when reading or when working on projects  4) Try to make a habit of using large eye movements when looking for objects to each side

## 2017-03-20 ENCOUNTER — Observation Stay (HOSPITAL_COMMUNITY)
Admission: EM | Admit: 2017-03-20 | Discharge: 2017-03-21 | Disposition: A | Discharge status: Home / Self Care | Payer: Medicare HMO | Attending: Family Medicine | Admitting: Family Medicine

## 2017-03-20 ENCOUNTER — Other Ambulatory Visit: Payer: Self-pay

## 2017-03-20 ENCOUNTER — Encounter (HOSPITAL_COMMUNITY): Payer: Self-pay

## 2017-03-20 ENCOUNTER — Emergency Department (HOSPITAL_COMMUNITY): Payer: Medicare HMO

## 2017-03-20 DIAGNOSIS — I6330 Cerebral infarction due to thrombosis of unspecified cerebral artery: Secondary | ICD-10-CM | POA: Diagnosis not present

## 2017-03-20 DIAGNOSIS — I5023 Acute on chronic systolic (congestive) heart failure: Secondary | ICD-10-CM | POA: Insufficient documentation

## 2017-03-20 DIAGNOSIS — I2510 Atherosclerotic heart disease of native coronary artery without angina pectoris: Secondary | ICD-10-CM | POA: Diagnosis not present

## 2017-03-20 DIAGNOSIS — R748 Abnormal levels of other serum enzymes: Secondary | ICD-10-CM | POA: Diagnosis not present

## 2017-03-20 DIAGNOSIS — Z8673 Personal history of transient ischemic attack (TIA), and cerebral infarction without residual deficits: Secondary | ICD-10-CM | POA: Diagnosis not present

## 2017-03-20 DIAGNOSIS — Z86711 Personal history of pulmonary embolism: Secondary | ICD-10-CM | POA: Diagnosis not present

## 2017-03-20 DIAGNOSIS — I509 Heart failure, unspecified: Secondary | ICD-10-CM

## 2017-03-20 DIAGNOSIS — E785 Hyperlipidemia, unspecified: Secondary | ICD-10-CM | POA: Diagnosis not present

## 2017-03-20 DIAGNOSIS — N183 Chronic kidney disease, stage 3 (moderate): Secondary | ICD-10-CM | POA: Diagnosis not present

## 2017-03-20 DIAGNOSIS — Z87891 Personal history of nicotine dependence: Secondary | ICD-10-CM | POA: Diagnosis not present

## 2017-03-20 DIAGNOSIS — R0602 Shortness of breath: Secondary | ICD-10-CM | POA: Diagnosis present

## 2017-03-20 DIAGNOSIS — R0600 Dyspnea, unspecified: Secondary | ICD-10-CM | POA: Diagnosis not present

## 2017-03-20 DIAGNOSIS — G4733 Obstructive sleep apnea (adult) (pediatric): Secondary | ICD-10-CM | POA: Insufficient documentation

## 2017-03-20 DIAGNOSIS — Z7901 Long term (current) use of anticoagulants: Secondary | ICD-10-CM | POA: Insufficient documentation

## 2017-03-20 DIAGNOSIS — I130 Hypertensive heart and chronic kidney disease with heart failure and stage 1 through stage 4 chronic kidney disease, or unspecified chronic kidney disease: Secondary | ICD-10-CM | POA: Diagnosis not present

## 2017-03-20 LAB — CBC WITH DIFFERENTIAL/PLATELET
Basophils Absolute: 0.1 10*3/uL (ref 0.0–0.1)
Basophils Relative: 1 %
Eosinophils Absolute: 0.2 10*3/uL (ref 0.0–0.7)
Eosinophils Relative: 3 %
HCT: 46.2 % (ref 39.0–52.0)
Hemoglobin: 14.9 g/dL (ref 13.0–17.0)
Lymphocytes Relative: 43 %
Lymphs Abs: 3 10*3/uL (ref 0.7–4.0)
MCH: 29 pg (ref 26.0–34.0)
MCHC: 32.3 g/dL (ref 30.0–36.0)
MCV: 90.1 fL (ref 78.0–100.0)
Monocytes Absolute: 0.7 10*3/uL (ref 0.1–1.0)
Monocytes Relative: 10 %
Neutro Abs: 3 10*3/uL (ref 1.7–7.7)
Neutrophils Relative %: 43 %
Platelets: 189 10*3/uL (ref 150–400)
RBC: 5.13 MIL/uL (ref 4.22–5.81)
RDW: 13.4 % (ref 11.5–15.5)
WBC: 7 10*3/uL (ref 4.0–10.5)

## 2017-03-20 LAB — CBC
HCT: 46.4 % (ref 39.0–52.0)
Hemoglobin: 14.9 g/dL (ref 13.0–17.0)
MCH: 28 pg (ref 26.0–34.0)
MCHC: 32.1 g/dL (ref 30.0–36.0)
MCV: 87.1 fL (ref 78.0–100.0)
Platelets: 186 10*3/uL (ref 150–400)
RBC: 5.33 MIL/uL (ref 4.22–5.81)
RDW: 13.2 % (ref 11.5–15.5)
WBC: 6.5 10*3/uL (ref 4.0–10.5)

## 2017-03-20 LAB — BASIC METABOLIC PANEL
Anion gap: 8 (ref 5–15)
BUN: 10 mg/dL (ref 6–20)
CO2: 25 mmol/L (ref 22–32)
Calcium: 8.7 mg/dL — ABNORMAL LOW (ref 8.9–10.3)
Chloride: 106 mmol/L (ref 101–111)
Creatinine, Ser: 1.17 mg/dL (ref 0.61–1.24)
GFR calc Af Amer: >60 mL/min (ref >60)
GFR calc non Af Amer: >60 mL/min (ref >60)
Glucose, Bld: 106 mg/dL — ABNORMAL HIGH (ref 65–99)
Potassium: 3.7 mmol/L (ref 3.5–5.1)
Sodium: 139 mmol/L (ref 135–145)

## 2017-03-20 LAB — COMPREHENSIVE METABOLIC PANEL
ALT: 39 U/L (ref 17–63)
AST: 28 U/L (ref 15–41)
Albumin: 4.1 g/dL (ref 3.5–5.0)
Alkaline Phosphatase: 58 U/L (ref 38–126)
Anion gap: 9 (ref 5–15)
BUN: 10 mg/dL (ref 6–20)
CO2: 28 mmol/L (ref 22–32)
Calcium: 9 mg/dL (ref 8.9–10.3)
Chloride: 102 mmol/L (ref 101–111)
Creatinine, Ser: 1.29 mg/dL — ABNORMAL HIGH (ref 0.61–1.24)
GFR calc Af Amer: >60 mL/min (ref >60)
GFR calc non Af Amer: 55 mL/min — ABNORMAL LOW (ref >60)
Glucose, Bld: 113 mg/dL — ABNORMAL HIGH (ref 65–99)
Potassium: 3.9 mmol/L (ref 3.5–5.1)
Sodium: 139 mmol/L (ref 135–145)
Total Bilirubin: 1.3 mg/dL — ABNORMAL HIGH (ref 0.3–1.2)
Total Protein: 7.5 g/dL (ref 6.5–8.1)

## 2017-03-20 LAB — TROPONIN I
Troponin I: 0.06 ng/mL (ref <0.03)
Troponin I: 0.06 ng/mL (ref <0.03)
Troponin I: 0.06 ng/mL (ref <0.03)
Troponin I: 0.06 ng/mL (ref <0.03)

## 2017-03-20 LAB — BRAIN NATRIURETIC PEPTIDE: B Natriuretic Peptide: 257 pg/mL — ABNORMAL HIGH (ref 0.0–100.0)

## 2017-03-20 MED ORDER — FUROSEMIDE 10 MG/ML IJ SOLN
20.0000 mg | Freq: Two times a day (BID) | INTRAMUSCULAR | Status: DC
  Administered 2017-03-20: 20 mg via INTRAVENOUS
  Filled 2017-03-20: qty 2

## 2017-03-20 MED ORDER — ONDANSETRON HCL 4 MG/2ML IJ SOLN: 4.0000 mg | Freq: Four times a day (QID) | INTRAMUSCULAR | Status: DC | PRN

## 2017-03-20 MED ORDER — FUROSEMIDE 20 MG PO TABS
20.0000 mg | ORAL_TABLET | Freq: Every day | ORAL | Status: DC
  Administered 2017-03-21: 20 mg via ORAL
  Filled 2017-03-20: qty 1

## 2017-03-20 MED ORDER — METOPROLOL SUCCINATE ER 25 MG PO TB24
37.5000 mg | ORAL_TABLET | Freq: Every day | ORAL | Status: DC
  Administered 2017-03-21: 37.5 mg via ORAL
  Filled 2017-03-20: qty 2

## 2017-03-20 MED ORDER — SODIUM CHLORIDE 0.9% FLUSH
3.0000 mL | Freq: Two times a day (BID) | INTRAVENOUS | Status: DC
  Administered 2017-03-20 – 2017-03-21 (×3): 3 mL via INTRAVENOUS

## 2017-03-20 MED ORDER — APIXABAN 5 MG PO TABS
5.0000 mg | ORAL_TABLET | Freq: Two times a day (BID) | ORAL | Status: DC
  Administered 2017-03-20 – 2017-03-21 (×3): 5 mg via ORAL
  Filled 2017-03-20 (×3): qty 1

## 2017-03-20 MED ORDER — SODIUM CHLORIDE 0.9 % IV SOLN: 250.0000 mL | INTRAVENOUS | Status: DC | PRN

## 2017-03-20 MED ORDER — SODIUM CHLORIDE 0.9% FLUSH: 3.0000 mL | INTRAVENOUS | Status: DC | PRN

## 2017-03-20 MED ORDER — FUROSEMIDE 10 MG/ML IJ SOLN
20.0000 mg | Freq: Once | INTRAMUSCULAR | Status: AC
  Administered 2017-03-20: 20 mg via INTRAVENOUS
  Filled 2017-03-20: qty 2

## 2017-03-20 MED ORDER — ATORVASTATIN CALCIUM 40 MG PO TABS
40.0000 mg | ORAL_TABLET | Freq: Every day | ORAL | Status: DC
  Administered 2017-03-20: 40 mg via ORAL
  Filled 2017-03-20: qty 1

## 2017-03-20 MED ORDER — METOPROLOL SUCCINATE ER 50 MG PO TB24
50.0000 mg | ORAL_TABLET | Freq: Every day | ORAL | Status: DC
  Administered 2017-03-20: 50 mg via ORAL
  Filled 2017-03-20: qty 1

## 2017-03-20 MED ORDER — ONDANSETRON HCL 4 MG PO TABS: 4.0000 mg | ORAL_TABLET | Freq: Four times a day (QID) | ORAL | Status: DC | PRN

## 2017-03-20 NOTE — ED Notes (Signed)
CRITICAL VALUE ALERT  Critical Value:  Troponin 0.06 Date & Time Notied:  03/20/17 @ 0225 Provider Notified:[place] Orders Received/Actions taken: None yet

## 2017-03-20 NOTE — Consult Note (Signed)
CARDIOLOGY CONSULT NOTE    Patient ID: ZORAWAR STROLLO; [id number]; Aug 31, 1945   Admit date: 03/20/2017 Date of Consult: 03/20/2017  Primary Care Provider: Rosita Fire, MD Primary Cardiologist: Kate Sable, MD    Patient Profile:   [name] is a 71 y.o. male with a hx of coronary artery disease, with history of anterior MI in 2004, PCI of the LAD and RCA, history of coronary artery bypass grafting in 2011,, LIMA to LAD, SVG to diagonal 1, OM1 and OM 2, ,HFrEF (EF of 30-35%), history of prior CVA, PE, recent cerebral thrombosis with CVA (recent discharge on 03/09/2017 for same)  followed by Dr.Xu, now on Eliquis twice daily, obstructive sleep apnea, hypertension, and hyperlipidemia.  Who is being seen today for the evaluation of CHF at the request of Dr. Jerilee Hoh, hospitalist service  History of Present Illness:   [name] presented to the hospital with complaints of shortness of breath with exertion, and PND.  He does have 2 pillow orthopnea.  He states that he was drinking a lot of fluid over the last 24 hours, almost a pitcher of ice tea along with a liter and a half of water.  He also admits to some dietary noncompliance intermittently.  On arrival to the emergency room he was found to be hypertensive with a blood pressure 162/99, bradycardic heart rate in the 50s, EKG revealing sinus rhythm heart rate 60 bpm with PACs and incomplete right bundle Kellye Mizner block.  Nonspecific ST-T wave abnormalities were also noted inferior, anterior lateral.  Pertinent labs, did not reveal anemia or thrombocytopenia.  Sodium 139, potassium 3.7, glucose 106, creatinine 1.17.  Troponin 0 0.06.  BNP 257.  Chest x-ray was negative for CHF or pneumonia.  He was given IV Lasix 20 mg x1.  He remains on IV Lasix 20 mg twice daily.  Diuresis has not been documented.   He states he is feeling better, breathing better, and would like to go home.  Past Medical History:  Diagnosis Date  .  Arteriosclerotic cardiovascular disease (ASCVD) 1998   Inferior MI->PCI of the RCA in 1998; acute anterior MI in 06/2002->  PCI of RCA and LAD with DES x2 to RCA, residual 80% ostial D1, and 80% mid CX and EF-40%; CABG-2011, LIMA-LAD, SVG to D1, OM1 & OM2; EF of 35-40% in 07/2010  . Chronic anticoagulation   . Hyperlipidemia   . Keloid    median sternotomy  . Pulmonary embolism ([place]) 03/2010  . 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 COLONOSCOPY N/A 01/22/2017   Procedure: COLONOSCOPY;  Surgeon: Danie Binder, MD;  [facility];  Service: Endoscopy;  Laterality: N/A;  200  . CORONARY ARTERY BYPASS GRAFT  03/18/2010   LIMA-LAD, SVG to diagonal, OM1 & OM2  . POLYPECTOMY  01/22/2017   Procedure: POLYPECTOMY;  Surgeon: Danie Binder, MD;  Location: [facility];  Service: Endoscopy;;  Transverse(CS) and sigmoid colon(HS)  . PTCA  06/1996   LAD & RCA  . TEE WITHOUT CARDIOVERSION N/A 06/10/2012   Procedure: TRANSESOPHAGEAL ECHOCARDIOGRAM (TEE);  Surgeon: Josue Hector, MD;  Location: [facility];  Service: Cardiovascular;  Laterality: N/A;     Home Medications:  Prior to Admission medications   Medication Sig Start Date End Date Taking? Authorizing Provider  apixaban (ELIQUIS) 5 MG TABS tablet Take 1 tablet (5 mg total) by mouth 2 (two) times daily.  03/09/17   Debbe Odea, MD  atorvastatin (LIPITOR) 40 MG tablet Take 1 tablet (40 mg total) by mouth daily at 6 PM. 03/09/17   Debbe Odea, MD  cycloSPORINE (RESTASIS) 0.05 % ophthalmic emulsion Place 1 drop into both eyes daily as needed.    [provider]  fluticasone (FLONASE) 50 MCG/ACT nasal spray Place 2 sprays into both nostrils 2 (two) times daily as needed for allergies or rhinitis.    [provider]  metoprolol succinate (TOPROL-XL) 50 MG 24 hr tablet TAKE 1 TABLET (50 MG TOTAL) BY MOUTH DAILY. TAKE WITH OR IMMEDIATELY FOLLOWING A MEAL.  07/03/14   Lendon Colonel, NP  nitroGLYCERIN (NITROSTAT) 0.4 MG SL tablet Place 1 tablet (0.4 mg total) under the tongue every 5 (five) minutes as needed for chest pain. up to 3 doses. 07/05/13   Lendon Colonel, NP    Inpatient Medications: Scheduled Meds: . apixaban  5 mg Oral BID  . atorvastatin  40 mg Oral q1800  . furosemide  20 mg Intravenous Q12H  . metoprolol succinate  50 mg Oral Daily  . sodium chloride flush  3 mL Intravenous Q12H   Continuous Infusions: . sodium chloride     PRN Meds: sodium chloride, ondansetron **OR** ondansetron (ZOFRAN) IV, sodium chloride flush  Allergies:    Allergies  Allergen Reactions  . Lisinopril     Mouth and tongue swells    Social History:   Social History   Socioeconomic History  . Marital status: Widowed    Spouse name: Not on file  . Number of children: Not on file  . Years of education: Not on file  . Highest education level: Not on file  Social Needs  . Financial resource strain: Not on file  . Food insecurity - worry: Not on file  . Food insecurity - inability: Not on file  . Transportation needs - medical: Not on file  . Transportation needs - non-medical: Not on file  Occupational History  . Occupation: disability  Tobacco Use  . Smoking status: Former Smoker    Last attempt to quit: 2004    Years since quitting: 14.9  . Smokeless tobacco: Never Used  Substance and Sexual Activity  . Alcohol use: Yes    Comment: rare  . Drug use: No    Comment: Former cocaine abuse - last use for 51th birthday  . Sexual activity: Not on file  Other Topics Concern  . Not on file  Social History Narrative  . Not on file    Family History:  Family History  Problem Relation Age of Onset  . Cancer Mother 17     ROS:  Please see the history of present illness.  ROS  All other ROS reviewed and negative.     Physical Exam/Data:   Vitals:   03/20/17 0332 03/20/17 0335 03/20/17 0400 03/20/17 0518  BP: (!) 141/72   138/65 (!) 142/67  Pulse: 76 (!) 52 (!) 48 (!) 47  Resp:  16  16  Temp:    (!) 97.5 F (36.4 C)  TempSrc:    Oral  SpO2: 95% 97% 96% 99%  Weight:    213 lb 11.2 oz (96.9 kg)  Height:    5\' 6"  (1.676 m)    Intake/Output Summary (Last 24 hours) at 03/20/2017 0810 Last data filed at 03/20/2017 0500 Gross per 24 hour  Intake 240 ml  Output -  Net 240 ml   Filed Weights   03/20/17 0047  03/20/17 0518  Weight: 220 lb (99.8 kg) 213 lb 11.2 oz (96.9 kg)   Body mass index is 34.49 kg/m.  General:  Well nourished, well developed, in no acute distress HEENT: normal Lymph: no adenopathy Neck: no JVD Endocrine:  No thryomegaly Vascular: No carotid bruits; FA pulses 2+ bilaterally without bruits  Cardiac:  normal S1, S2; IRRR, bradycardic, 1/6 systolic murmur heard best at the left sternal border;  Lungs:  clear to auscultation bilaterally, no wheezing, rhonchi or rales  Abd: soft, nontender, no hepatomegaly  Ext: no edema Musculoskeletal:  No deformities, BUE and BLE strength normal and equal Skin: warm and dry  Neuro:  CNs 2-12 intact, no focal abnormalities noted.  Very hard of hearing Psych:  Normal affect   EKG:  The EKG was personally reviewed and demonstrates: Sinus rhythm, incomplete right bundle Allysa Governale block, frequent PACs, heart rate of 60. Telemetry:  Telemetry was personally reviewed and demonstrates: Sinus rhythm, frequent PVCs and PACs, some bradycardia rates into the 30s, rates now in the 60s.  Relevant CV Studies: Echocardiogram 05/19/2016 Left ventricle: The cavity size was normal. Wall thickness was   increased in a pattern of mild LVH. Systolic function was   moderately to severely reduced. The estimated ejection fraction   was in the range of 30% to 35%. There is akinesis of the   mid-apicalanteroseptal, inferolateral, inferoseptal, and apical   myocardium. Doppler parameters are consistent with abnormal left   ventricular relaxation (grade 1 diastolic  dysfunction). There was   a 1.5 cm (L) x 1.0 cm (W), fixed, apicalthrombusassociated with a   hypokinetic segment. - Left atrium: The atrium was mildly dilated. Volume/bsa, ES   (1-plane Simpson&'s, A4C): 34.3 ml/m^2.  Laboratory Data:  Chemistry Recent Labs  Lab 03/20/17 0110 03/20/17 0600  NA 139 139  K 3.7 3.9  CL 106 102  CO2 25 28  GLUCOSE 106* 113*  BUN 10 10  CREATININE 1.17 1.29*  CALCIUM 8.7* 9.0  GFRNONAA >60 55*  GFRAA >60 >60  ANIONGAP 8 9    Recent Labs  Lab 03/20/17 0600  PROT 7.5  ALBUMIN 4.1  AST 28  ALT 39  ALKPHOS 58  BILITOT 1.3*   Hematology Recent Labs  Lab 03/20/17 0110 03/20/17 0600  WBC 7.0 6.5  RBC 5.13 5.33  HGB 14.9 14.9  HCT 46.2 46.4  MCV 90.1 87.1  MCH 29.0 28.0  MCHC 32.3 32.1  RDW 13.4 13.2  PLT 189 186   Cardiac Enzymes Recent Labs  Lab 03/20/17 0110 03/20/17 0600  TROPONINI 0.06* 0.06*   No results for input(s): TROPIPOC in the last 168 hours.  BNP Recent Labs  Lab 03/20/17 0110  BNP 257.0*    DDimer No results for input(s): DDIMER in the last 168 hours.  Radiology/Studies:  Dg Chest 2 View  Result Date: 03/20/2017 CLINICAL DATA:  Dyspnea tonight. EXAM: CHEST  2 VIEW COMPARISON:  03/08/2017 FINDINGS: Mild hyperinflation. Moderate cardiomegaly, unchanged. No airspace consolidation. No effusions. Mild interstitial coarsening. IMPRESSION: Hyperinflation and cardiomegaly.  No consolidation.  No effusions. Electronically Signed   By: Andreas Newport M.D.   On: 03/20/2017 02:48    Assessment and Plan:   1. Acute on chronic HFrEF: No evidence of CHF on chest x-ray, however his breathing status has significantly improved with IV diuresis.  Most recent echocardiogram revealing an ejection fraction of 30% to 35%.  He is currently not on diuretics at home.  Creatinine 1.29 this a.m.  Will begin p.o.  Lasix 40 mg daily.  Potassium replacement.  Consider ICD.  Can be discussed as outpatient on follow-up with Dr.  Bronson Ing.  2. Bradycardia: Heart rate is low normal in the setting of reduced ejection fraction.  He denies    any syncope, near syncope, or dizziness.  Review of home medications has him on                 metoprolol succinate 50 mg daily.  This is not been restarted during admission.  He has had       heart rates into the 30s.  Currently not using CPAP while admitted.  Consider reducing metoprolol succinate  3.  Hypertension: Currently not optimal for reduced ejection fraction.  View of home medications does not have him on antihypertensive medications.  He is allergic to lisinopril due to mouth and tongue swelling.  Would not be a candidate for Entresto as he may have similar response to ARB.  Consider adding hydralazine, 25  mg 3 times daily, to keep blood pressure under control with titration as outpatient.  4.  Recent history of cerebral thrombus with CVA: Has been admitted within the last 2 weeks to [facility], and been seen by neurology.  Plavix was discontinued and he was started on Eliquis 5 mg twice daily.  Has a history of prior CVA and is followed by Dr. Lily Lovings.  5.  History of CAD: Coronary artery bypass grafting in 2011, with PCI to LAD and RCA.  Continue on metoprolol, statin therapy, is no longer on aspirin as he is currently on Eliquis.  6.  History of OSA: Currently not using CPAP during hospitalization as he was recently admitted.  If plans to keep him for another overnight stay would recommend CPAP.  For questions or updates, please contact [place] Please consult [email or web address] for contact info under Cardiology/STEMI.   Signed, Phill Myron. West Pugh, ANP, AACC  03/20/2017 8:10 AM  Attending note Patient seen and discussed with DNP Purcell Nails, I agree with her documentation. History of CAD with prior stent to LAD and RCA, CABG in 2011, HL, CVA and prior PE. Chronic systolic HF LVEF 74-12%, grade I diastolic dysfunction History of cerebral thrombosis currently on  eliquis. Admitted with SOB.   WBC 7 Hgb 14.9 Plt 189 K 3.9 Cr 1.17 BNP 257  Trop 0.06-->0.06 CXR hyperinflation and cardiomegaly EKG SR, chronic ST/T changes  Admitted with acute on chronic systolic HF. I/Os are incomplete. Symptoms improved with IV diuresis and now are resolved, agree with changing to oral 20mg  daily (starting tomorrow, already received IV dose today. Low heart rates at times, we will lower Toprol to 37.5 mg daily. He has ACE allergy, would also avoid ARB due to risk of cross reactivity. Defer consideration for hydral/nitrates to primary cardiologist. Mild flat troponin in setting of CHF is nonspecific.   Patient back to baseline this AM. [facility] for discharge from cardiology standpoint, we will arrange f/u  Zandra Abts MD

## 2017-03-20 NOTE — ED Triage Notes (Signed)
Pt reports onset of "breathing not right"  When he was at a friend's house tonight.  Pt denies pain or other complaints

## 2017-03-20 NOTE — Progress Notes (Signed)
Patient seen and examined, database reviewed.  No family members at bedside.  Patient admitted earlier today due to shortness of breath.  Found to have acute on chronic combined CHF.  He is on Eliquis for cerebral thrombosis.  I do not think documented I's and O's are accurate.  He apparently had good diuretic response in the ED with IV Lasix.  Plan to continue IV Lasix today and possibly transition over to oral tomorrow morning with discharge over the next 24-48 hours.  We will continue to follow.  Domingo Mend, MD [facility] Pager: [phone]

## 2017-03-20 NOTE — ED Provider Notes (Signed)
[facility] EMERGENCY DEPARTMENT Provider Note   CSN: [id number] Arrival date & time: 03/20/17  0035     History   Chief Complaint Chief Complaint  Patient presents with  . Shortness of Breath    HPI [name] is a 71 y.o. male.  Patient is a poor historian.  Presents with "not breathing quite right" that onset today.  He states his shortness of breath comes and goes and is not having any at this time.  He noticed that tonight when he was trying to go up stairs he became short of breath and had to rest.  Denies having any chest pain, nausea, vomiting, diaphoresis or syncope.  Patient states he has a history of obstructive sleep apnea but has not worn his mask in over a year.  History is notable for patient having a recent stroke on December 3 and he has since been on Eliquis.  States compliance with this and has not missed any doses.  He denies any leg pain or leg swelling.  No abdominal pain, nausea or vomiting.  Patient states he normally sleeps on his side propped up with a couple pillows.  He does have a history of systolic heart failure with EF of 30-35% but has not been on diuretics.  Patient also with history of CAD status post CABG, remote pulmonary embolism, recent stroke on December 3.  Eliquis is a new medication that was started during his hospitalization. he did see his PCP Dr. Legrand Rams today for recheck and everything was going well.   The history is provided by the patient and a relative.  Shortness of Breath  Pertinent negatives include no fever, no headaches, no cough, no chest pain, no vomiting, no abdominal pain, no rash and no leg swelling.    Past Medical History:  Diagnosis Date  . Arteriosclerotic cardiovascular disease (ASCVD) 1998   Inferior MI->PCI of the RCA in 1998; acute anterior MI in 06/2002->  PCI of RCA and LAD with DES x2 to RCA, residual 80% ostial D1, and 80% mid CX and EF-40%; CABG-2011, LIMA-LAD, SVG to D1, OM1 & OM2; EF of 35-40% in 07/2010  .  Chronic anticoagulation   . Hyperlipidemia   . Keloid    median sternotomy  . Pulmonary embolism ([place]) 03/2010  . 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 Cerebral thrombosis with cerebral infarction 03/08/2017  . TIA (transient ischemic attack) 03/08/2017  . Visual field defect of right eye 03/07/2017  . CKD (chronic kidney disease), stage III ([place]) 03/07/2017  . Hyperglycemia 03/07/2017  . OSA (obstructive sleep apnea) 03/07/2017  . Special screening for malignant neoplasms, colon   . 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 Chronic anticoagulation-discontinued 10/19/2012  . CVA (cerebral infarction) 06/09/2012  . Arteriosclerotic cardiovascular disease (ASCVD)   . Pulmonary embolism ([place]) 03/07/2010  . Hyperlipidemia 11/24/2008  . Essential hypertension 11/24/2008    Past Surgical History:  Procedure Laterality Date  . COLONOSCOPY N/A 01/22/2017   Procedure: COLONOSCOPY;  Surgeon: Danie Binder, MD;  [facility];  Service: Endoscopy;  Laterality: N/A;  200  . CORONARY ARTERY BYPASS GRAFT  03/18/2010   LIMA-LAD, SVG to diagonal, OM1 & OM2  . POLYPECTOMY  01/22/2017   Procedure: POLYPECTOMY;  Surgeon: Danie Binder, MD;  [facility];  Service: Endoscopy;;  Transverse(CS) and sigmoid colon(HS)  . PTCA  06/1996   LAD & RCA  .  TEE WITHOUT CARDIOVERSION N/A 06/10/2012   Procedure: TRANSESOPHAGEAL ECHOCARDIOGRAM (TEE);  Surgeon: Josue Hector, MD;  Location[facility];  Service: Cardiovascular;  Laterality: N/A;       Home Medications    Prior to Admission medications   Medication Sig Start Date End Date Taking? Authorizing Provider  apixaban (ELIQUIS) 5 MG TABS tablet Take 1 tablet (5 mg total) by mouth 2 (two) times daily. 03/09/17   Debbe Odea, MD  atorvastatin (LIPITOR) 40 MG tablet Take 1 tablet (40 mg total) by mouth daily at 6 PM. 03/09/17   Debbe Odea, MD  cycloSPORINE (RESTASIS)  0.05 % ophthalmic emulsion Place 1 drop into both eyes daily as needed.    [provider]  fluticasone (FLONASE) 50 MCG/ACT nasal spray Place 2 sprays into both nostrils 2 (two) times daily as needed for allergies or rhinitis.    [provider]  metoprolol succinate (TOPROL-XL) 50 MG 24 hr tablet TAKE 1 TABLET (50 MG TOTAL) BY MOUTH DAILY. TAKE WITH OR IMMEDIATELY FOLLOWING A MEAL. 07/03/14   Lendon Colonel, NP  nitroGLYCERIN (NITROSTAT) 0.4 MG SL tablet Place 1 tablet (0.4 mg total) under the tongue every 5 (five) minutes as needed for chest pain. up to 3 doses. 07/05/13   Lendon Colonel, NP  pravastatin (PRAVACHOL) 20 MG tablet TAKE 1 TABLET BY MOUTH EVERY EVENING 06/09/14   Lendon Colonel, NP    Family History Family History  Problem Relation Age of Onset  . Cancer Mother 29    Social History Social History   Tobacco Use  . Smoking status: Former Smoker    Last attempt to quit: 2004    Years since quitting: 14.9  . Smokeless tobacco: Never Used  Substance Use Topics  . Alcohol use: Yes    Comment: rare  . Drug use: No    Comment: Former cocaine abuse - last use for 51th birthday     Allergies   Lisinopril   Review of Systems Review of Systems  Constitutional: Positive for fatigue. Negative for activity change, appetite change and fever.  HENT: Negative for congestion.   Respiratory: Positive for shortness of breath. Negative for cough and chest tightness.   Cardiovascular: Negative for chest pain and leg swelling.  Gastrointestinal: Negative for abdominal pain, nausea and vomiting.  Genitourinary: Negative for testicular pain.  Musculoskeletal: Negative for arthralgias and myalgias.  Skin: Negative for rash.  Neurological: Negative for dizziness, weakness and headaches.   all other systems are negative except as noted in the HPI and PMH.     Physical Exam Updated Vital Signs BP (!) 162/99 (BP Location: Right Arm)   Pulse (!) 59    Temp 97.8 F (36.6 C) (Oral)   Resp 20   Ht 5\' 6"  (1.676 m)   Wt 99.8 kg (220 lb)   SpO2 97%   BMI 35.51 kg/m   Physical Exam  Constitutional: He is oriented to person, place, and time. He appears well-developed and well-nourished. No distress.  No distress, speaking in full sentences  HENT:  Head: Normocephalic and atraumatic.  Mouth/Throat: Oropharynx is clear and moist. No oropharyngeal exudate.  Eyes: Conjunctivae and EOM are normal. Pupils are equal, round, and reactive to light.  Neck: Normal range of motion. Neck supple.  No meningismus.  Cardiovascular: Normal rate, regular rhythm, normal heart sounds and intact distal pulses.  No murmur heard. Pulmonary/Chest: Effort normal. No respiratory distress. He has rales.  Diminished breath sounds bilaterally with faint  basilar crackles  Abdominal: Soft. There is no tenderness. There is no rebound and no guarding.  Musculoskeletal: Normal range of motion. He exhibits edema. He exhibits no tenderness.  +1 pretibial edema bilaterally  Neurological: He is alert and oriented to person, place, and time. No cranial nerve deficit. He exhibits normal muscle tone. Coordination normal.  No ataxia on finger to nose bilaterally. No pronator drift. 5/5 strength throughout. CN 2-12 intact.Equal grip strength. Sensation intact.   Skin: Skin is warm.  Psychiatric: He has a normal mood and affect. His behavior is normal.  Nursing note and vitals reviewed.    ED Treatments / Results  Labs (all labs ordered are listed, but only abnormal results are displayed) Labs Reviewed  BASIC METABOLIC PANEL - Abnormal; Notable for the following components:      Result Value   Glucose, Bld 106 (*)    Calcium 8.7 (*)    All other components within normal limits  TROPONIN I - Abnormal; Notable for the following components:   Troponin I 0.06 (*)    All other components within normal limits  BRAIN NATRIURETIC PEPTIDE - Abnormal; Notable for the following  components:   B Natriuretic Peptide 257.0 (*)    All other components within normal limits  CBC WITH DIFFERENTIAL/PLATELET    EKG  EKG Interpretation  Date/Time:  Friday March 20 2017 00:46:27 EST Ventricular Rate:  60 PR Interval:    QRS Duration: 116 QT Interval:  477 QTC Calculation: 477 R Axis:   67 Text Interpretation:  Sinus rhythm Atrial premature complexes Borderline short PR interval Probable left atrial enlargement Incomplete right bundle branch block Inferior infarct, age indeterminate Anterolateral infarct, age indeterminate No significant change was found Confirmed by Ezequiel Essex [phone]) on 03/20/2017 12:49:42 AM       Radiology Dg Chest 2 View  Result Date: 03/20/2017 CLINICAL DATA:  Dyspnea tonight. EXAM: CHEST  2 VIEW COMPARISON:  03/08/2017 FINDINGS: Mild hyperinflation. Moderate cardiomegaly, unchanged. No airspace consolidation. No effusions. Mild interstitial coarsening. IMPRESSION: Hyperinflation and cardiomegaly.  No consolidation.  No effusions. Electronically Signed   By: Andreas Newport M.D.   On: 03/20/2017 02:48    Procedures Procedures (including critical care time)  Medications Ordered in ED Medications - No data to display   Initial Impression / Assessment and Plan / ED Course  I have reviewed the triage vital signs and the nursing notes.  Pertinent labs & imaging results that were available during my care of the patient were reviewed by me and considered in my medical decision making (see chart for details).    Patient with poorly described sensation of shortness of breath over the past day.  Worse with exertion.  Denies chest pain, cough or fever.  Suspect CHF exacerbation.  Patient with known EF of 30-35% with apical thrombus on Eliquis.  He is not on diuretics at home for unclear reasons.  EKG is unchanged.  IV Lasix given.  Chest x-ray consistent with edema.  Creatinine is improved from baseline. Patient ambulatory without  desaturation but does have some increased work of breathing.  Troponin minimally elevated more so than previous. Compliant with Eliquis.  Doubt pulmonary embolism.  Troponin minimally elevated likely due to CHF exacerbation.  Doubt ACS.  No chest pain.  Patient does have some elevated troponin at baseline.  He has diuresed about 700 mL of urine.  Would benefit from increased diuresis.  Admission discussed with Dr. Darrick Meigs.  Final Clinical Impressions(s) / ED Diagnoses  Final diagnoses:  Acute on chronic systolic congestive heart failure [facility])    ED Discharge Orders    None       Ezequiel Essex, MD 03/20/17 0403

## 2017-03-20 NOTE — ED Notes (Signed)
Pt ambulated to BR with steady gait, no assistance needed  

## 2017-03-20 NOTE — Progress Notes (Signed)
Pt ambulated around nursing unit independently. Pt tolerated well. No complaints. Ambulated approximately 200 feet.

## 2017-03-20 NOTE — Care Management Obs Status (Signed)
[facility] NOTIFICATION   Patient Details  Name: [name] MRN: [id number] Date of Birth: 01-22-1946   Medicare Observation Status Notification Given:  Yes    Sherald Barge, RN 03/20/2017, 1:29 PM

## 2017-03-20 NOTE — H&P (Signed)
TRH H&P    Patient Demographics:    [name], is a 71 y.o. male  MRN: [id number]  DOB - 1945/09/06  Admit Date - 03/20/2017  Referring MD/NP/PA: Dr Wyvonnia Dusky  Outpatient Primary MD for the patient is Rosita Fire, MD  Patient coming from: Home  Chief Complaint  Patient presents with  . Shortness of Breath      HPI:    [name]  is a 71 y.o. male, with history of cerebral thrombosis with cerebral infarction, currently on anticoagulation with Eliquis, hyperlipidemia, hypertension, CAD status post CABG in 2011, pulmonary embolism, chronic systolic CHF EF 84-69%, CKD stage III to hospital with shortness of breath.  Patient says that tonight he noticed that when he was trying to go upstairs he became short of breath and when he laid down to rest he was unable to lay down as breathing became worse. Denies chest pain Denies nausea vomiting or diarrhea. Denies abdominal pain Denies dysuria  Patient says that he normally sleeps on his side propped up with couple of pillows.  He does have a history of CHF with a EF 30-35% but is currently not on diuretics.  In the ED, lab work showed BNP 257, troponin 0.06. Patient diuresed very well with 20 mg IV Lasix in the ED.    Review of systems:      All other systems reviewed and are negative.   With Past History of the following :    Past Medical History:  Diagnosis Date  . Arteriosclerotic cardiovascular disease (ASCVD) 1998   Inferior MI->PCI of the RCA in 1998; acute anterior MI in 06/2002->  PCI of RCA and LAD with DES x2 to RCA, residual 80% ostial D1, and 80% mid CX and EF-40%; CABG-2011, LIMA-LAD, SVG to D1, OM1 & OM2; EF of 35-40% in 07/2010  . Chronic anticoagulation   . Hyperlipidemia   . Keloid    median sternotomy  . Pulmonary embolism ([place]) 03/2010  . Substance abuse [facility])    formerly cocaine      Past Surgical History:  Procedure  Laterality Date  . COLONOSCOPY N/A 01/22/2017   Procedure: COLONOSCOPY;  Surgeon: Danie Binder, MD;  Location: [facility];  Service: Endoscopy;  Laterality: N/A;  200  . CORONARY ARTERY BYPASS GRAFT  03/18/2010   LIMA-LAD, SVG to diagonal, OM1 & OM2  . POLYPECTOMY  01/22/2017   Procedure: POLYPECTOMY;  Surgeon: Danie Binder, MD;  Location: [facility];  Service: Endoscopy;;  Transverse(CS) and sigmoid colon(HS)  . PTCA  06/1996   LAD & RCA  . TEE WITHOUT CARDIOVERSION N/A 06/10/2012   Procedure: TRANSESOPHAGEAL ECHOCARDIOGRAM (TEE);  Surgeon: Josue Hector, MD;  Location: [facility];  Service: Cardiovascular;  Laterality: N/A;      Social History:      Social History   Tobacco Use  . Smoking status: Former Smoker    Last attempt to quit: 2004    Years since quitting: 14.9  . Smokeless tobacco: Never Used  Substance Use Topics  .  Alcohol use: Yes    Comment: rare       Family History :     Family History  Problem Relation Age of Onset  . Cancer Mother 39      Home Medications:   Prior to Admission medications   Medication Sig Start Date End Date Taking? Authorizing Provider  apixaban (ELIQUIS) 5 MG TABS tablet Take 1 tablet (5 mg total) by mouth 2 (two) times daily. 03/09/17   Debbe Odea, MD  atorvastatin (LIPITOR) 40 MG tablet Take 1 tablet (40 mg total) by mouth daily at 6 PM. 03/09/17   Debbe Odea, MD  cycloSPORINE (RESTASIS) 0.05 % ophthalmic emulsion Place 1 drop into both eyes daily as needed.    [provider]  fluticasone (FLONASE) 50 MCG/ACT nasal spray Place 2 sprays into both nostrils 2 (two) times daily as needed for allergies or rhinitis.    [provider]  metoprolol succinate (TOPROL-XL) 50 MG 24 hr tablet TAKE 1 TABLET (50 MG TOTAL) BY MOUTH DAILY. TAKE WITH OR IMMEDIATELY FOLLOWING A MEAL. 07/03/14   Lendon Colonel, NP  nitroGLYCERIN (NITROSTAT) 0.4 MG SL tablet Place 1 tablet (0.4 mg total) under the tongue  every 5 (five) minutes as needed for chest pain. up to 3 doses. 07/05/13   Lendon Colonel, NP     Allergies:     Allergies  Allergen Reactions  . Lisinopril     Mouth and tongue swells     Physical Exam:   Vitals  Blood pressure (!) 141/72, pulse (!) 52, temperature 97.8 F (36.6 C), temperature source Oral, resp. rate 16, height 5\' 6"  (1.676 m), weight 99.8 kg (220 lb), SpO2 97 %.  1.  General: Appears in no acute distress  2. Psychiatric:  Intact judgement and  insight, awake alert, oriented x 3.  3. Neurologic: No focal neurological deficits, all cranial nerves intact.Strength 5/5 all 4 extremities, sensation intact all 4 extremities, plantars down going.  4. Eyes :  anicteric sclerae, moist conjunctivae with no lid lag. PERRLA.  5. ENMT:  Oropharynx clear with moist mucous membranes and good dentition  6. Neck:  supple, no cervical lymphadenopathy appriciated, No thyromegaly  7. Respiratory : Normal respiratory effort, decreased breath sounds bilaterally  8. Cardiovascular : RRR, no gallops, rubs or murmurs, no leg edema  9. Gastrointestinal:  Positive bowel sounds, abdomen soft, non-tender to palpation,no hepatosplenomegaly, no rigidity or guarding       10. Skin:  No cyanosis, normal texture and turgor, no rash, lesions or ulcers  11.Musculoskeletal:  Good muscle tone,  joints appear normal , no effusions,  normal range of motion    Data Review:    CBC Recent Labs  Lab 03/20/17 0110  WBC 7.0  HGB 14.9  HCT 46.2  PLT 189  MCV 90.1  MCH 29.0  MCHC 32.3  RDW 13.4  LYMPHSABS 3.0  MONOABS 0.7  EOSABS 0.2  BASOSABS 0.1   ------------------------------------------------------------------------------------------------------------------  Chemistries  Recent Labs  Lab 03/20/17 0110  NA 139  K 3.7  CL 106  CO2 25  GLUCOSE 106*  BUN 10  CREATININE 1.17  CALCIUM 8.7*    ------------------------------------------------------------------------------------------------------------------  ------------------------------------------------------------------------------------------------------------------ GFR: Estimated Creatinine Clearance: 65 mL/min (by C-G formula based on SCr of 1.17 mg/dL). Liver Function Tests: No results for input(s): AST, ALT, ALKPHOS, BILITOT, PROT, ALBUMIN in the last 168 hours. No results for input(s): LIPASE, AMYLASE in the last 168 hours. No results for input(s): AMMONIA in the last  168 hours. Coagulation Profile: No results for input(s): INR, PROTIME in the last 168 hours. Cardiac Enzymes: Recent Labs  Lab 03/20/17 0110  TROPONINI 0.06*    --------------------------------------------------------------------------------------------------------------- Urine analysis:    Component Value Date/Time   COLORURINE STRAW (A) 03/07/2017 1446   APPEARANCEUR CLEAR 03/07/2017 1446   LABSPEC 1.032 (H) 03/07/2017 1446   PHURINE 7.0 03/07/2017 1446   GLUCOSEU NEGATIVE 03/07/2017 1446   HGBUR NEGATIVE 03/07/2017 1446   BILIRUBINUR NEGATIVE 03/07/2017 1446   KETONESUR NEGATIVE 03/07/2017 1446   PROTEINUR 30 (A) 03/07/2017 1446   UROBILINOGEN 1.0 03/16/2010 0652   NITRITE NEGATIVE 03/07/2017 1446   LEUKOCYTESUR NEGATIVE 03/07/2017 1446      Imaging Results:    Dg Chest 2 View  Result Date: 03/20/2017 CLINICAL DATA:  Dyspnea tonight. EXAM: CHEST  2 VIEW COMPARISON:  03/08/2017 FINDINGS: Mild hyperinflation. Moderate cardiomegaly, unchanged. No airspace consolidation. No effusions. Mild interstitial coarsening. IMPRESSION: Hyperinflation and cardiomegaly.  No consolidation.  No effusions. Electronically Signed   By: Andreas Newport M.D.   On: 03/20/2017 02:48    My personal review of EKG: Rhythm NSR, nonspecific ST-T changes, T wave inversions in V4 V5 V6.  Unchanged from previous EKG from 03/07/2017   Assessment & Plan:     Active Problems:   CHF exacerbation ([place])   1. CHF exacerbation-patient's EF is 30-35%, he received 20 mg of Lasix in the ED with good diuretic response.  We will start Lasix 20 mg IV every 12 hours.  Consult cardiology in a.m. for optimizing his medical therapy. 2. Mild elevation of troponin-likely from demand ischemia from above.  EKG shows nonspecific ST-T changes.  Will cycle troponin every 6 hours x3. 3. Cerebral thrombosis with cerebral infarction-patient recently started on Eliquis 4. Hypertension-continue metoprolol 5. Hyperlipidemia-continue Lipitor 6. Obstructive sleep apnea-CPAP at bedtime   DVT Prophylaxis-   Eliqus  AM Labs Ordered, also please review Full Orders  Family Communication: Admission, patients condition and plan of care including tests being ordered have been discussed with the patient  who indicate understanding and agree with the plan and Code Status.  Code Status: Full code  Admission status: Observation  Time spent in minutes : 60 minutes   Oswald Hillock M.D on 03/20/2017 at 3:43 AM  Between 7am to 7pm - Pager - [phone]. After 7pm go to [email or web address] - password [facility]  [facility] - Office  [phone]

## 2017-03-21 DIAGNOSIS — I130 Hypertensive heart and chronic kidney disease with heart failure and stage 1 through stage 4 chronic kidney disease, or unspecified chronic kidney disease: Secondary | ICD-10-CM | POA: Diagnosis not present

## 2017-03-21 DIAGNOSIS — I509 Heart failure, unspecified: Secondary | ICD-10-CM

## 2017-03-21 LAB — BASIC METABOLIC PANEL
Anion gap: 7 (ref 5–15)
BUN: 13 mg/dL (ref 6–20)
CO2: 27 mmol/L (ref 22–32)
Calcium: 8.7 mg/dL — ABNORMAL LOW (ref 8.9–10.3)
Chloride: 103 mmol/L (ref 101–111)
Creatinine, Ser: 1.3 mg/dL — ABNORMAL HIGH (ref 0.61–1.24)
GFR calc Af Amer: >60 mL/min (ref >60)
GFR calc non Af Amer: 54 mL/min — ABNORMAL LOW (ref >60)
Glucose, Bld: 111 mg/dL — ABNORMAL HIGH (ref 65–99)
Potassium: 3.7 mmol/L (ref 3.5–5.1)
Sodium: 137 mmol/L (ref 135–145)

## 2017-03-21 MED ORDER — FUROSEMIDE 20 MG PO TABS: 20.0000 mg | ORAL_TABLET | Freq: Every day | ORAL | 0 refills | Status: DC

## 2017-03-21 MED ORDER — METOPROLOL SUCCINATE ER 25 MG PO TB24: 37.5000 mg | ORAL_TABLET | Freq: Every day | ORAL | 0 refills | Status: DC

## 2017-03-21 NOTE — Discharge Instructions (Signed)
Follow with Primary MD  [name], Tesfaye, MD  and other consultant's as instructed your Hospitalist MD ° °Please get a complete blood count and chemistry panel checked by your Primary MD at your next visit, and again as instructed by your Primary MD. ° °Get Medicines reviewed and adjusted: °Please take all your medications with you for your next visit with your Primary MD ° °Laboratory/radiological data: °Please request your Primary MD to go over all hospital tests and procedure/radiological results at the follow up, please ask your Primary MD to get all Hospital records sent to his/her office. ° °In some cases, they will be blood work, cultures and biopsy results pending at the time of your discharge. Please request that your primary care M.D. follows up on these results. ° °Also Note the following: °If you experience worsening of your admission symptoms, develop shortness of breath, life threatening emergency, suicidal or homicidal thoughts you must seek medical attention immediately by calling 911 or calling your MD immediately  if symptoms less severe. ° °You must read complete instructions/literature along with all the possible adverse reactions/side effects for all the Medicines you take and that have been prescribed to you. Take any new Medicines after you have completely understood and accpet all the possible adverse reactions/side effects.  ° °Do not drive when taking Pain medications or sleeping medications (Benzodaizepines) ° °Do not take more than prescribed Pain, Sleep and Anxiety Medications. It is not advisable to combine anxiety,sleep and pain medications without talking with your primary care practitioner ° °Special Instructions: If you have smoked or chewed Tobacco  in the last 2 yrs please stop smoking, stop any regular Alcohol  and or any Recreational drug use. ° °Wear Seat belts while driving. ° °Please note: °You were cared for by a hospitalist during your hospital stay. Once you are discharged,  your primary care physician will handle any further medical issues. Please note that NO REFILLS for any discharge medications will be authorized once you are discharged, as it is imperative that you return to your primary care physician (or establish a relationship with a primary care physician if you do not have one) for your post hospital discharge needs so that they can reassess your need for medications and monitor your lab values. ° ° ° ° °

## 2017-03-21 NOTE — Discharge Summary (Addendum)
Physician Discharge Summary  [name] LPF:[id number] DOB: 24-Mar-1946 DOA: 03/20/2017  PCP: Rosita Fire, MD Cardiologist: Dr. Court Joy  Admit date: 03/20/2017 Discharge date: 03/21/2017  Admitted From: Home  Disposition: Home   Recommendations for Outpatient Follow-up:  1. Follow up with PCP in 1 weeks 2. Please obtain BMP/CBC in one week 3. Please follow up on the following pending results:  Discharge Condition: STABLE   CODE STATUS: FULL    Brief Hospitalization Summary: Please see all hospital notes, images, labs for full details of the hospitalization.  [name] is a 71 y.o. male with a hx of coronary artery disease, with history of anterior MI in 2004, PCI of the LAD and RCA, history of coronary artery bypass grafting in 2011,, LIMA to LAD, SVG to diagonal 1, OM1 and OM 2, ,HFrEF (EF of 30-35%), history of prior CVA, PE, recent cerebral thrombosis with CVA (recent discharge on 03/09/2017 for same)  followed by Dr.Xu, now on Eliquis twice daily, obstructive sleep apnea, hypertension, and hyperlipidemia   HPI:    [name]  is a 71 y.o. male, with history of cerebral thrombosis with cerebral infarction, currently on anticoagulation with Eliquis, hyperlipidemia, hypertension, CAD status post CABG in 2011, pulmonary embolism, chronic systolic CHF EF 53-29%, CKD stage III to hospital with shortness of breath.  Patient says that tonight he noticed that when he was trying to go upstairs he became short of breath and when he laid down to rest he was unable to lay down as breathing became worse. Denies chest pain Denies nausea vomiting or diarrhea. Denies abdominal pain Denies dysuria  Patient says that he normally sleeps on his side propped up with couple of pillows.  He does have a history of CHF with a EF 30-35% but is currently not on diuretics.  In the ED, lab work showed BNP 257, troponin 0.06. Patient diuresed very well with 20 mg IV Lasix in the  ED.  1. CHF exacerbation-patient's EF is 30-35%, he received 20 mg of Lasix in the ED with good diuretic response.  Discharge on Lasix 20 mg daily and follow up outpatient.  Consulted cardiology in a.m. For medical therapy optimization. See consult notes. 2. Mild elevation of troponin-likely from demand ischemia from above.  EKG shows nonspecific ST-T changes.  troponin stayed flat 0.06. 3. Cerebral thrombosis with cerebral infarction-patient recently started on Eliquis 4. Hypertension-continue metoprolol 5. Hyperlipidemia-continue Lipitor 6. Obstructive sleep apnea-CPAP at bedtime  Echocardiogram Study Conclusions  - Left ventricle: The cavity size was normal. Wall thickness was  increased in a pattern of mild LVH. Systolic function was   moderately to severely reduced. The estimated ejection fraction was in the range of 30% to 35%. There is akinesis of the   mid-apicalanteroseptal, inferolateral, inferoseptal, and apical myocardium. Doppler parameters are consistent with abnormal left   ventricular relaxation   DVT Prophylaxis-   Eliqus  Family Communication: Admission, patients condition and plan of care including tests being ordered have been discussed with the patient  who indicate understanding and agree with the plan and Code Status.  Discharge Diagnoses:  Principal Problem:   CHF exacerbation [facility])  Discharge Instructions: Discharge Instructions    (HEART FAILURE PATIENTS) Call MD:  Anytime you have any of the following symptoms: 1) 3 pound weight gain in 24 hours or 5 pounds in 1 week 2) shortness of breath, with or without a dry hacking cough 3) swelling in the hands, feet or stomach 4) if you have  to sleep on extra pillows at night in order to breathe.   Complete by:  As directed    Call MD for:  difficulty breathing, headache or visual disturbances   Complete by:  As directed    Call MD for:  extreme fatigue   Complete by:  As directed    Call MD for:  persistant  nausea and vomiting   Complete by:  As directed    Call MD for:  severe uncontrolled pain   Complete by:  As directed    Diet - low sodium heart healthy   Complete by:  As directed    Increase activity slowly   Complete by:  As directed      Allergies as of 03/21/2017      Reactions   Lisinopril    Mouth and tongue swells      Medication List    TAKE these medications   apixaban 5 MG Tabs tablet Commonly known as:  ELIQUIS Take 1 tablet (5 mg total) by mouth 2 (two) times daily.   atorvastatin 40 MG tablet Commonly known as:  LIPITOR Take 1 tablet (40 mg total) by mouth daily at 6 PM.   cycloSPORINE 0.05 % ophthalmic emulsion Commonly known as:  RESTASIS Place 1 drop into both eyes daily as needed.   fluticasone 50 MCG/ACT nasal spray Commonly known as:  FLONASE Place 2 sprays into both nostrils 2 (two) times daily as needed for allergies or rhinitis.   furosemide 20 MG tablet Commonly known as:  LASIX Take 1 tablet (20 mg total) by mouth daily. Start taking on:  03/22/2017   metoprolol succinate 25 MG 24 hr tablet Commonly known as:  TOPROL-XL Take 1.5 tablets (37.5 mg total) by mouth daily. Take with or immediately following a meal. What changed:    medication strength  See the new instructions.   neomycin-polymyxin b-dexamethasone 3.5-10000-0.1 Oint Commonly known as:  MAXITROL Apply 3-4 times a day to surgical site for 1 week   nitroGLYCERIN 0.4 MG SL tablet Commonly known as:  NITROSTAT Place 1 tablet (0.4 mg total) under the tongue every 5 (five) minutes as needed for chest pain. up to 3 doses.      Follow-up Information    Rosita Fire, MD. Schedule an appointment as soon as possible for a visit in 1 week(s).   Specialty:  Internal Medicine Contact information: [place] [address] [phone]        Herminio Commons, MD. Schedule an appointment as soon as possible for a visit in 3 week(s).   Specialty:   Cardiology Contact information: [place] [address] [phone]          Allergies  Allergen Reactions  . Lisinopril     Mouth and tongue swells   Allergies as of 03/21/2017      Reactions   Lisinopril    Mouth and tongue swells      Medication List    TAKE these medications   apixaban 5 MG Tabs tablet Commonly known as:  ELIQUIS Take 1 tablet (5 mg total) by mouth 2 (two) times daily.   atorvastatin 40 MG tablet Commonly known as:  LIPITOR Take 1 tablet (40 mg total) by mouth daily at 6 PM.   cycloSPORINE 0.05 % ophthalmic emulsion Commonly known as:  RESTASIS Place 1 drop into both eyes daily as needed.   fluticasone 50 MCG/ACT nasal spray Commonly known as:  FLONASE Place 2 sprays  into both nostrils 2 (two) times daily as needed for allergies or rhinitis.   furosemide 20 MG tablet Commonly known as:  LASIX Take 1 tablet (20 mg total) by mouth daily. Start taking on:  03/22/2017   metoprolol succinate 25 MG 24 hr tablet Commonly known as:  TOPROL-XL Take 1.5 tablets (37.5 mg total) by mouth daily. Take with or immediately following a meal. What changed:    medication strength  See the new instructions.   neomycin-polymyxin b-dexamethasone 3.5-10000-0.1 Oint Commonly known as:  MAXITROL Apply 3-4 times a day to surgical site for 1 week   nitroGLYCERIN 0.4 MG SL tablet Commonly known as:  NITROSTAT Place 1 tablet (0.4 mg total) under the tongue every 5 (five) minutes as needed for chest pain. up to 3 doses.       Procedures/Studies: Ct Angio Head W Or Wo Contrast  Result Date: 03/07/2017 CLINICAL DATA:  Code stroke. Visual difficulty. RIGHT-sided weakness. EXAM: CT ANGIOGRAPHY HEAD AND NECK TECHNIQUE: Multidetector CT imaging of the head and neck was performed using the standard protocol during bolus administration of intravenous contrast. Multiplanar CT image reconstructions and MIPs were obtained to evaluate the vascular anatomy.  Carotid stenosis measurements (when applicable) are obtained utilizing NASCET criteria, using the distal internal carotid diameter as the denominator. CONTRAST:  58mL ISOVUE-370 IOPAMIDOL (ISOVUE-370) INJECTION 76% COMPARISON:  Noncontrast CT head earlier.  MR head 06/07/2012. FINDINGS: CTA NECK FINDINGS Aortic arch: Standard branching. Imaged portion shows no evidence of aneurysm or dissection. No significant stenosis of the major arch vessel origins. Right carotid system: No evidence of dissection, stenosis (50% or greater) or occlusion.Minor calcific plaque at the bifurcation Left carotid system: No evidence of dissection, stenosis (50% or greater) or occlusion. Minor calcific plaque at the bifurcation. Vertebral arteries: LEFT vertebral dominant. No evidence of dissection, stenosis, or occlusion. Minor ostial calcification on the RIGHT. Skeleton: Mild spondylosis.  No worrisome osseous lesions. Other neck: No masses. Upper chest: Emphysema. Review of the MIP images confirms the above findings CTA HEAD FINDINGS Anterior circulation: No significant stenosis, proximal occlusion, aneurysm, or vascular malformation. Posterior circulation: No significant stenosis, proximal occlusion, aneurysm, or vascular malformation. Venous sinuses: As permitted by contrast timing, patent. Anatomic variants: None. Delayed phase: No abnormal postcontrast enhancement. Review of the MIP images confirms the above findings IMPRESSION: No intracranial or extracranial flow reducing stenosis or occlusion. No arterial dissection is observed. Emphysema (ICD10-J43.9). Electronically Signed   By: Staci Righter M.D.   On: 03/07/2017 15:41   Dg Chest 2 View  Result Date: 03/20/2017 CLINICAL DATA:  Dyspnea tonight. EXAM: CHEST  2 VIEW COMPARISON:  03/08/2017 FINDINGS: Mild hyperinflation. Moderate cardiomegaly, unchanged. No airspace consolidation. No effusions. Mild interstitial coarsening. IMPRESSION: Hyperinflation and cardiomegaly.  No  consolidation.  No effusions. Electronically Signed   By: Andreas Newport M.D.   On: 03/20/2017 02:48   Dg Chest 2 View  Result Date: 03/08/2017 CLINICAL DATA:  TIA EXAM: CHEST  2 VIEW COMPARISON:  03/22/2012 FINDINGS: Cardiomegaly. Prior CABG. Vascular congestion. Mild interstitial prominence may reflect interstitial edema. No effusions. IMPRESSION: Cardiomegaly. Vascular congestion with suspected mild interstitial edema. Electronically Signed   By: Rolm Baptise M.D.   On: 03/08/2017 07:53   Ct Angio Neck W Or Wo Contrast  Result Date: 03/07/2017 CLINICAL DATA:  Code stroke. Visual difficulty. RIGHT-sided weakness. EXAM: CT ANGIOGRAPHY HEAD AND NECK TECHNIQUE: Multidetector CT imaging of the head and neck was performed using the standard protocol during bolus administration of intravenous contrast. Multiplanar  CT image reconstructions and MIPs were obtained to evaluate the vascular anatomy. Carotid stenosis measurements (when applicable) are obtained utilizing NASCET criteria, using the distal internal carotid diameter as the denominator. CONTRAST:  59mL ISOVUE-370 IOPAMIDOL (ISOVUE-370) INJECTION 76% COMPARISON:  Noncontrast CT head earlier.  MR head 06/07/2012. FINDINGS: CTA NECK FINDINGS Aortic arch: Standard branching. Imaged portion shows no evidence of aneurysm or dissection. No significant stenosis of the major arch vessel origins. Right carotid system: No evidence of dissection, stenosis (50% or greater) or occlusion.Minor calcific plaque at the bifurcation Left carotid system: No evidence of dissection, stenosis (50% or greater) or occlusion. Minor calcific plaque at the bifurcation. Vertebral arteries: LEFT vertebral dominant. No evidence of dissection, stenosis, or occlusion. Minor ostial calcification on the RIGHT. Skeleton: Mild spondylosis.  No worrisome osseous lesions. Other neck: No masses. Upper chest: Emphysema. Review of the MIP images confirms the above findings CTA HEAD FINDINGS  Anterior circulation: No significant stenosis, proximal occlusion, aneurysm, or vascular malformation. Posterior circulation: No significant stenosis, proximal occlusion, aneurysm, or vascular malformation. Venous sinuses: As permitted by contrast timing, patent. Anatomic variants: None. Delayed phase: No abnormal postcontrast enhancement. Review of the MIP images confirms the above findings IMPRESSION: No intracranial or extracranial flow reducing stenosis or occlusion. No arterial dissection is observed. Emphysema (ICD10-J43.9). Electronically Signed   By: Staci Righter M.D.   On: 03/07/2017 15:41   Mr Brain Wo Contrast  Result Date: 03/08/2017 CLINICAL DATA:  Sudden onset of visual change 03/06/2017. Flashing lights were followed by obscuration of the RIGHT lower visual field. Stroke risk factors include multiple prior strokes, hypertension, and hyperlipidemia. History of substance abuse. EXAM: MRI HEAD WITHOUT CONTRAST TECHNIQUE: Multiplanar, multiecho pulse sequences of the brain and surrounding structures were obtained without intravenous contrast. COMPARISON:  CT head without contrast and CTA head neck 03/07/2017. FINDINGS: Brain: Restricted diffusion involves the LEFT occipital lobe, primarily calcarine cortex, extending toward the medial and posterior temporal lobe, corresponding low ADC, consistent with acute infarction. No other areas of acute stroke are observed. No hemorrhage, mass lesion, hydrocephalus, or extra-axial fluid. Generalized atrophy. Mild to moderate subcortical and periventricular T2 and FLAIR hyperintensities, likely chronic microvascular ischemic change. Multiple areas of chronic infarction are observed throughout the brain, most notable LEFT frontal region, also RIGHT inferior cerebellum. Lacunar infarct LEFT thalamus is chronic. Prominent perivascular spaces. Vascular: Flow voids are maintained in the carotid, basilar, and vertebral arteries. No LEFT PCA occlusion is evident. Skull  and upper cervical spine: Normal marrow signal. Sinuses/Orbits: Chronic sinus disease.  No orbital findings. Other: Compared with yesterday's CT, the infarct is not visible. IMPRESSION: Acute LEFT PCA territory infarct primarily involving the medial LEFT occipital lobe. This is nonhemorrhagic. Atrophy, small vessel disease, and areas of multiple old cerebral and cerebellar infarction. No associated large vessel occlusion. Proximal LEFT PCA demonstrates patency on MRA and CTA. Electronically Signed   By: Staci Righter M.D.   On: 03/08/2017 07:44   Ct Head Code Stroke Wo Contrast  Result Date: 03/07/2017 CLINICAL DATA:  Code stroke. RIGHT-sided weakness. Possible visual field defect. EXAM: CT HEAD WITHOUT CONTRAST TECHNIQUE: Contiguous axial images were obtained from the base of the skull through the vertex without intravenous contrast. COMPARISON:  CT head 06/06/2012.  MR brain 06/07/2012. FINDINGS: Brain: No evidence for acute infarction, hemorrhage, mass lesion, hydrocephalus, or extra-axial fluid. Mild to moderate cerebral and cerebellar atrophy. Multiple chronic areas of cerebral infarction are seen including LEFT frontal, RIGHT occipital, LEFT thalamus, and RIGHT cerebellum. Vascular: Calcification  of the cavernous internal carotid arteries consistent with cerebrovascular atherosclerotic disease. No signs of intracranial large vessel occlusion. Skull: Normal. Negative for fracture or focal lesion. Sinuses/Orbits: No acute finding. Other: None. ASPECTS [facility] Stroke Program Early CT Score) - Ganglionic level infarction (caudate, lentiform nuclei, internal capsule, insula, M1-M3 cortex): 7 - Supraganglionic infarction (M4-M6 cortex): 3 Total score (0-10 with 10 being normal): 10 IMPRESSION: 1. Atrophy and small vessel disease. Multiple areas of chronic infarction. No acute intracranial findings are observed. Similar appearance to priors. 2. ASPECTS is 10. These results were called by telephone at the time of  interpretation on 03/07/2017 at 3:07 pm to Dr. Nanda Quinton , who verbally acknowledged these results. Electronically Signed   By: Staci Righter M.D.   On: 03/07/2017 15:15      Subjective: Pt says he feels much better, walking around room, says ready to discharge home. No SOB, no CP.   Discharge Exam: Vitals:   03/21/17 0452 03/21/17 0902  BP: 124/66 113/67  Pulse: (!) 56 65  Resp: 18   Temp: (!) 97.5 F (36.4 C)   SpO2: 97% 96%   Vitals:   03/20/17 2100 03/20/17 2107 03/21/17 0452 03/21/17 0902  BP: 127/62  124/66 113/67  Pulse: 60  (!) 56 65  Resp: 18  18   Temp: 97.7 F (36.5 C)  (!) 97.5 F (36.4 C)   TempSrc: Oral  Oral   SpO2: 100% 100% 97% 96%  Weight:      Height:       General: Pt is alert, awake, not in acute distress Cardiovascular: RRR, S1/S2 +, no rubs, no gallops Respiratory: CTA bilaterally, no wheezing, no rhonchi Abdominal: Soft, NT, ND, bowel sounds + Extremities: no edema, no cyanosis   The results of significant diagnostics from this hospitalization (including imaging, microbiology, ancillary and laboratory) are listed below for reference.     Microbiology: No results found for this or any previous visit (from the past 240 hour(s)).   Labs: BNP (last 3 results) Recent Labs    03/20/17 0110  BNP 509.3*   Basic Metabolic Panel: Recent Labs  Lab 03/20/17 0110 03/20/17 0600 03/21/17 0637  NA 139 139 137  K 3.7 3.9 3.7  CL 106 102 103  CO2 25 28 27   GLUCOSE 106* 113* 111*  BUN 10 10 13   CREATININE 1.17 1.29* 1.30*  CALCIUM 8.7* 9.0 8.7*   Liver Function Tests: Recent Labs  Lab 03/20/17 0600  AST 28  ALT 39  ALKPHOS 58  BILITOT 1.3*  PROT 7.5  ALBUMIN 4.1   No results for input(s): LIPASE, AMYLASE in the last 168 hours. No results for input(s): AMMONIA in the last 168 hours. CBC: Recent Labs  Lab 03/20/17 0110 03/20/17 0600  WBC 7.0 6.5  NEUTROABS 3.0  --   HGB 14.9 14.9  HCT 46.2 46.4  MCV 90.1 87.1  PLT 189 186    Cardiac Enzymes: Recent Labs  Lab 03/20/17 0110 03/20/17 0600 03/20/17 1040 03/20/17 1727  TROPONINI 0.06* 0.06* 0.06* 0.06*   BNP: Invalid input(s): POCBNP CBG: No results for input(s): GLUCAP in the last 168 hours. D-Dimer No results for input(s): DDIMER in the last 72 hours. Hgb A1c No results for input(s): HGBA1C in the last 72 hours. Lipid Profile No results for input(s): CHOL, HDL, LDLCALC, TRIG, CHOLHDL, LDLDIRECT in the last 72 hours. Thyroid function studies No results for input(s): TSH, T4TOTAL, T3FREE, THYROIDAB in the last 72 hours.  Invalid input(s): FREET3 Anemia  work up No results for input(s): VITAMINB12, FOLATE, FERRITIN, TIBC, IRON, RETICCTPCT in the last 72 hours. Urinalysis    Component Value Date/Time   COLORURINE STRAW (A) 03/07/2017 1446   APPEARANCEUR CLEAR 03/07/2017 1446   LABSPEC 1.032 (H) 03/07/2017 1446   PHURINE 7.0 03/07/2017 1446   GLUCOSEU NEGATIVE 03/07/2017 1446   HGBUR NEGATIVE 03/07/2017 1446   BILIRUBINUR NEGATIVE 03/07/2017 1446   KETONESUR NEGATIVE 03/07/2017 1446   PROTEINUR 30 (A) 03/07/2017 1446   UROBILINOGEN 1.0 03/16/2010 0652   NITRITE NEGATIVE 03/07/2017 1446   LEUKOCYTESUR NEGATIVE 03/07/2017 1446   Sepsis Labs Invalid input(s): PROCALCITONIN,  WBC,  LACTICIDVEN Microbiology No results found for this or any previous visit (from the past 240 hour(s)).  Time coordinating discharge: 38 mins  SIGNED:  Irwin Brakeman, MD  [facility] 03/21/2017, 10:28 AM Pager [phone]  If 7PM-7AM, please contact night-coverage [email or web address] Password [email or web address]

## 2017-03-21 NOTE — Progress Notes (Signed)
Discharge instructions gone over with patient, verbalized understanding. IV removed, patient tolerated procedure well. 

## 2017-03-22 DIAGNOSIS — G4733 Obstructive sleep apnea (adult) (pediatric): Secondary | ICD-10-CM | POA: Diagnosis not present

## 2017-03-22 DIAGNOSIS — I10 Essential (primary) hypertension: Secondary | ICD-10-CM | POA: Diagnosis not present

## 2017-03-22 DIAGNOSIS — I2510 Atherosclerotic heart disease of native coronary artery without angina pectoris: Secondary | ICD-10-CM | POA: Diagnosis not present

## 2017-03-23 DIAGNOSIS — I10 Essential (primary) hypertension: Secondary | ICD-10-CM | POA: Diagnosis not present

## 2017-03-23 DIAGNOSIS — G4733 Obstructive sleep apnea (adult) (pediatric): Secondary | ICD-10-CM | POA: Diagnosis not present

## 2017-03-23 DIAGNOSIS — I2510 Atherosclerotic heart disease of native coronary artery without angina pectoris: Secondary | ICD-10-CM | POA: Diagnosis not present

## 2017-03-30 DIAGNOSIS — G4733 Obstructive sleep apnea (adult) (pediatric): Secondary | ICD-10-CM | POA: Diagnosis not present

## 2017-03-30 DIAGNOSIS — I10 Essential (primary) hypertension: Secondary | ICD-10-CM | POA: Diagnosis not present

## 2017-03-30 DIAGNOSIS — I2510 Atherosclerotic heart disease of native coronary artery without angina pectoris: Secondary | ICD-10-CM | POA: Diagnosis not present

## 2017-04-02 DIAGNOSIS — L989 Disorder of the skin and subcutaneous tissue, unspecified: Secondary | ICD-10-CM | POA: Diagnosis not present

## 2017-04-06 DIAGNOSIS — G4733 Obstructive sleep apnea (adult) (pediatric): Secondary | ICD-10-CM | POA: Diagnosis not present

## 2017-04-06 DIAGNOSIS — I2510 Atherosclerotic heart disease of native coronary artery without angina pectoris: Secondary | ICD-10-CM | POA: Diagnosis not present

## 2017-04-06 DIAGNOSIS — I10 Essential (primary) hypertension: Secondary | ICD-10-CM | POA: Diagnosis not present

## 2017-04-07 DIAGNOSIS — G4733 Obstructive sleep apnea (adult) (pediatric): Secondary | ICD-10-CM | POA: Diagnosis not present

## 2017-04-07 DIAGNOSIS — I2510 Atherosclerotic heart disease of native coronary artery without angina pectoris: Secondary | ICD-10-CM | POA: Diagnosis not present

## 2017-04-07 DIAGNOSIS — I10 Essential (primary) hypertension: Secondary | ICD-10-CM | POA: Diagnosis not present

## 2017-04-08 NOTE — Progress Notes (Deleted)
Cardiology Office Note    Date:  04/08/2017   ID:  [name], DOB [birth year], MRN [id number]  PCP:  Brian Fire, MD  Cardiologist: Dr. Bronson [name]   No chief complaint on file.   History of Present Illness:    [name] is a 72 y.o. male with past medical history of CAD (s/p CABG in 2011 with LIMA-LAD and SVG-D1-OM1-OM2), ischemic cardiomyopathy (EF 35-40% in 2012, improved to 50-55% by echo in 2014), prior CVA, HTN, HLD, and prior PE who presents to the office today for hospital follow-up.   He was recently admitted to [facility] from 12/14 - 03/21/2017 for evaluation of worsening dyspnea and orthopnea, found to have a mild CHF exacerbation with BNP elevated to 257. A repeat echocardiogram had been obtained earlier in the month and showed a further reduced EF of 30-35% with akinesis of the mid-apicalanteroseptal, inferolateral, inferoseptal, and apical myocardium. He was started on PO Lasix 40mg  daily and Toprol-XL dosing was reduced to 37.5mg  daily in the setting of bradycardia. It was deferred to the outpatient setting regarding starting Hydralazine\Nitrates as has an allergy to ACE-I.   - BMET  Past Medical History:  Diagnosis Date  . Arteriosclerotic cardiovascular disease (ASCVD) 1998   Inferior MI->PCI of the RCA in 1998; acute anterior MI in 06/2002->  PCI of RCA and LAD with DES x2 to RCA, residual 80% ostial D1, and 80% mid CX and EF-40%; CABG-2011, LIMA-LAD, SVG to D1, OM1 & OM2; EF of 35-40% in 07/2010  . Chronic anticoagulation   . Hyperlipidemia   . Keloid    median sternotomy  . Pulmonary embolism ([place]) 03/2010  . 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 COLONOSCOPY N/A 01/22/2017   Procedure: COLONOSCOPY;  Surgeon: Brian Binder, MD;  [facility];  Service: Endoscopy;  Laterality: N/A;  200  . CORONARY ARTERY BYPASS GRAFT  03/18/2010   LIMA-LAD, SVG to diagonal, OM1 & OM2  .  POLYPECTOMY  01/22/2017   Procedure: POLYPECTOMY;  Surgeon: Brian Binder, MD;  [facility];  Service: Endoscopy;;  Transverse(CS) and sigmoid colon(HS)  . PTCA  06/1996   LAD & RCA  . TEE WITHOUT CARDIOVERSION N/A 06/10/2012   Procedure: TRANSESOPHAGEAL ECHOCARDIOGRAM (TEE);  Surgeon: Brian Hector, MD;  [facility];  Service: Cardiovascular;  Laterality: N/A;    Current Medications: Outpatient Medications Prior to Visit  Medication Sig Dispense Refill  . apixaban (ELIQUIS) 5 MG TABS tablet Take 1 tablet (5 mg total) by mouth 2 (two) times daily. 60 tablet 0  . atorvastatin (LIPITOR) 40 MG tablet Take 1 tablet (40 mg total) by mouth daily at 6 PM. 30 tablet 0  . cycloSPORINE (RESTASIS) 0.05 % ophthalmic emulsion Place 1 drop into both eyes daily as needed.    . fluticasone (FLONASE) 50 MCG/ACT nasal spray Place 2 sprays into both nostrils 2 (two) times daily as needed for allergies or rhinitis.    . furosemide (LASIX) 20 MG tablet Take 1 tablet (20 mg total) by mouth daily. 30 tablet 0  . metoprolol succinate (TOPROL-XL) 25 MG 24 hr tablet Take 1.5 tablets (37.5 mg total) by mouth daily. Take with or immediately following a meal. 45 tablet 0  . neomycin-polymyxin b-dexamethasone (MAXITROL) 3.5-10000-0.1 OINT Apply 3-4 times a day to surgical site for 1 week    . nitroGLYCERIN (NITROSTAT) 0.4 MG SL tablet Place  1 tablet (0.4 mg total) under the tongue every 5 (five) minutes as needed for chest pain. up to 3 doses. 25 tablet 4   No facility-administered medications prior to visit.      Allergies:   Lisinopril   Social History   Socioeconomic History  . Marital status: Widowed    Spouse name: Not on file  . Number of children: Not on file  . Years of education: Not on file  . Highest education level: Not on file  Social Needs  . Financial resource strain: Not on file  . Food insecurity - worry: Not on file  . Food insecurity - inability: Not on file  .  Transportation needs - medical: Not on file  . Transportation needs - non-medical: Not on file  Occupational History  . Occupation: disability  Tobacco Use  . Smoking status: Former Smoker    Last attempt to quit: 2004    Years since quitting: 15.0  . Smokeless tobacco: Never Used  Substance and Sexual Activity  . Alcohol use: Yes    Comment: rare  . Drug use: No    Comment: Former cocaine abuse - last use for 51th birthday  . Sexual activity: Not on file  Other Topics Concern  . Not on file  Social History Narrative  . Not on file     Family History:  The patient's ***family history includes Cancer (age of onset: 69) in his mother.   Review of Systems:   Please see the history of present illness.     General:  No chills, fever, night sweats or weight changes.  Cardiovascular:  No chest pain, dyspnea on exertion, edema, orthopnea, palpitations, paroxysmal nocturnal dyspnea. Dermatological: No rash, lesions/masses Respiratory: No cough, dyspnea Urologic: No hematuria, dysuria Abdominal:   No nausea, vomiting, diarrhea, bright red blood per rectum, melena, or hematemesis Neurologic:  No visual changes, wkns, changes in mental status. All other systems reviewed and are otherwise negative except as noted above.   Physical Exam:    VS:  There were no vitals taken for this visit.   General: Well developed, well nourished,male appearing in no acute distress. Head: Normocephalic, atraumatic, sclera non-icteric, no xanthomas, nares are without discharge.  Neck: No carotid bruits. JVD not elevated.  Lungs: Respirations regular and unlabored, without wheezes or rales.  Heart: ***Regular rate and rhythm. No S3 or S4.  No murmur, no rubs, or gallops appreciated. Abdomen: Soft, non-tender, non-distended with normoactive bowel sounds. No hepatomegaly. No rebound/guarding. No obvious abdominal masses. Msk:  Strength and tone appear normal for age. No joint deformities or  effusions. Extremities: No clubbing or cyanosis. No edema.  Distal pedal pulses are 2+ bilaterally. Neuro: Alert and oriented X 3. Moves all extremities spontaneously. No focal deficits noted. Psych:  Responds to questions appropriately with a normal affect. Skin: No rashes or lesions noted  Wt Readings from Last 3 Encounters:  03/20/17 213 lb 11.2 oz (96.9 kg)  03/07/17 212 lb 4.9 oz (96.3 kg)  01/22/17 220 lb (99.8 kg)        Studies/Labs Reviewed:   EKG:  EKG is*** ordered today.  The ekg ordered today demonstrates ***  Recent Labs: 03/07/2017: TSH 1.370 03/20/2017: ALT 39; B Natriuretic Peptide 257.0; Hemoglobin 14.9; Platelets 186 03/21/2017: BUN 13; Creatinine, Ser 1.30; Potassium 3.7; Sodium 137   Lipid Panel    Component Value Date/Time   CHOL 116 03/09/2017 0458   TRIG 76 03/09/2017 0458   HDL 43 03/09/2017 0458  CHOLHDL 2.7 03/09/2017 0458   VLDL 15 03/09/2017 0458   LDLCALC 58 03/09/2017 0458    Additional studies/ records that were reviewed today include:  ***  Assessment:    No diagnosis found.   Plan:   In order of problems listed above:  1. ***    Medication Adjustments/Labs and Tests Ordered: Current medicines are reviewed at length with the patient today.  Concerns regarding medicines are outlined above.  Medication changes, Labs and Tests ordered today are listed in the Patient Instructions below. There are no Patient Instructions on file for this visit.   Signed, Erma Heritage, PA-C  04/08/2017 7:41 PM    [address], [place], [place]  [address] Phone: [phone]; Fax: [phone]  [address] [place], [place] [address] Phone: [phone]

## 2017-04-09 ENCOUNTER — Ambulatory Visit: Payer: Self-pay | Admitting: Student

## 2017-04-09 ENCOUNTER — Encounter: Payer: Self-pay | Admitting: Student

## 2017-04-13 DIAGNOSIS — E782 Mixed hyperlipidemia: Secondary | ICD-10-CM | POA: Diagnosis not present

## 2017-04-13 DIAGNOSIS — I6930 Unspecified sequelae of cerebral infarction: Secondary | ICD-10-CM | POA: Diagnosis not present

## 2017-04-13 DIAGNOSIS — G4731 Primary central sleep apnea: Secondary | ICD-10-CM | POA: Diagnosis not present

## 2017-04-13 DIAGNOSIS — I10 Essential (primary) hypertension: Secondary | ICD-10-CM | POA: Diagnosis not present

## 2017-04-13 DIAGNOSIS — G4733 Obstructive sleep apnea (adult) (pediatric): Secondary | ICD-10-CM | POA: Diagnosis not present

## 2017-04-13 DIAGNOSIS — I2510 Atherosclerotic heart disease of native coronary artery without angina pectoris: Secondary | ICD-10-CM | POA: Diagnosis not present

## 2017-04-16 DIAGNOSIS — G4733 Obstructive sleep apnea (adult) (pediatric): Secondary | ICD-10-CM | POA: Diagnosis not present

## 2017-04-19 DIAGNOSIS — I2510 Atherosclerotic heart disease of native coronary artery without angina pectoris: Secondary | ICD-10-CM | POA: Diagnosis not present

## 2017-04-19 DIAGNOSIS — I6350 Cerebral infarction due to unspecified occlusion or stenosis of unspecified cerebral artery: Secondary | ICD-10-CM | POA: Diagnosis not present

## 2017-04-20 DIAGNOSIS — I2510 Atherosclerotic heart disease of native coronary artery without angina pectoris: Secondary | ICD-10-CM | POA: Diagnosis not present

## 2017-04-20 DIAGNOSIS — G4733 Obstructive sleep apnea (adult) (pediatric): Secondary | ICD-10-CM | POA: Diagnosis not present

## 2017-04-20 DIAGNOSIS — I10 Essential (primary) hypertension: Secondary | ICD-10-CM | POA: Diagnosis not present

## 2017-04-27 DIAGNOSIS — I2510 Atherosclerotic heart disease of native coronary artery without angina pectoris: Secondary | ICD-10-CM | POA: Diagnosis not present

## 2017-04-27 DIAGNOSIS — I10 Essential (primary) hypertension: Secondary | ICD-10-CM | POA: Diagnosis not present

## 2017-04-27 DIAGNOSIS — G4733 Obstructive sleep apnea (adult) (pediatric): Secondary | ICD-10-CM | POA: Diagnosis not present

## 2017-05-04 DIAGNOSIS — I2510 Atherosclerotic heart disease of native coronary artery without angina pectoris: Secondary | ICD-10-CM | POA: Diagnosis not present

## 2017-05-04 DIAGNOSIS — I10 Essential (primary) hypertension: Secondary | ICD-10-CM | POA: Diagnosis not present

## 2017-05-04 DIAGNOSIS — G4733 Obstructive sleep apnea (adult) (pediatric): Secondary | ICD-10-CM | POA: Diagnosis not present

## 2017-05-08 DIAGNOSIS — I10 Essential (primary) hypertension: Secondary | ICD-10-CM | POA: Diagnosis not present

## 2017-05-08 DIAGNOSIS — I2510 Atherosclerotic heart disease of native coronary artery without angina pectoris: Secondary | ICD-10-CM | POA: Diagnosis not present

## 2017-05-08 DIAGNOSIS — G4733 Obstructive sleep apnea (adult) (pediatric): Secondary | ICD-10-CM | POA: Diagnosis not present

## 2017-05-11 DIAGNOSIS — G4733 Obstructive sleep apnea (adult) (pediatric): Secondary | ICD-10-CM | POA: Diagnosis not present

## 2017-05-11 DIAGNOSIS — I2510 Atherosclerotic heart disease of native coronary artery without angina pectoris: Secondary | ICD-10-CM | POA: Diagnosis not present

## 2017-05-11 DIAGNOSIS — I10 Essential (primary) hypertension: Secondary | ICD-10-CM | POA: Diagnosis not present

## 2017-05-15 DIAGNOSIS — H40003 Preglaucoma, unspecified, bilateral: Secondary | ICD-10-CM | POA: Diagnosis not present

## 2017-05-18 ENCOUNTER — Encounter: Payer: Self-pay | Admitting: Cardiovascular Disease

## 2017-05-18 ENCOUNTER — Ambulatory Visit (INDEPENDENT_AMBULATORY_CARE_PROVIDER_SITE_OTHER): Payer: Medicare HMO | Admitting: Cardiovascular Disease

## 2017-05-18 VITALS — BP 142/80 | HR 71 | Ht 66.0 in | Wt 219.6 lb

## 2017-05-18 DIAGNOSIS — I513 Intracardiac thrombosis, not elsewhere classified: Secondary | ICD-10-CM | POA: Diagnosis not present

## 2017-05-18 DIAGNOSIS — Z8673 Personal history of transient ischemic attack (TIA), and cerebral infarction without residual deficits: Secondary | ICD-10-CM | POA: Diagnosis not present

## 2017-05-18 DIAGNOSIS — Z9289 Personal history of other medical treatment: Secondary | ICD-10-CM | POA: Diagnosis not present

## 2017-05-18 DIAGNOSIS — I25768 Atherosclerosis of bypass graft of coronary artery of transplanted heart with other forms of angina pectoris: Secondary | ICD-10-CM | POA: Diagnosis not present

## 2017-05-18 DIAGNOSIS — I5022 Chronic systolic (congestive) heart failure: Secondary | ICD-10-CM

## 2017-05-18 DIAGNOSIS — E782 Mixed hyperlipidemia: Secondary | ICD-10-CM | POA: Diagnosis not present

## 2017-05-18 DIAGNOSIS — G4733 Obstructive sleep apnea (adult) (pediatric): Secondary | ICD-10-CM | POA: Diagnosis not present

## 2017-05-18 DIAGNOSIS — I2510 Atherosclerotic heart disease of native coronary artery without angina pectoris: Secondary | ICD-10-CM | POA: Diagnosis not present

## 2017-05-18 DIAGNOSIS — I10 Essential (primary) hypertension: Secondary | ICD-10-CM

## 2017-05-18 DIAGNOSIS — I429 Cardiomyopathy, unspecified: Secondary | ICD-10-CM | POA: Diagnosis not present

## 2017-05-18 MED ORDER — ISOSORB DINITRATE-HYDRALAZINE 20-37.5 MG PO TABS: 1.0000 | ORAL_TABLET | Freq: Three times a day (TID) | ORAL | 6 refills | Status: DC

## 2017-05-18 NOTE — Patient Instructions (Addendum)
Your physician recommends that you schedule a follow-up appointment in: 2 months  with [facility]    Your physician has requested that you have a lexiscan myoview. For further information please visit [email or web address]. Please follow instruction sheet, as given.    START Bidil  20/37.5 mg three times a day     No lab work ordered today.       Thank you for choosing [facility] !

## 2017-05-18 NOTE — Progress Notes (Signed)
SUBJECTIVE: The patient presents for follow-up of chronic systolic heart failure, coronary artery disease with history of CABG in 2011, hyperlipidemia, CVA, and prior pulmonary embolism.  He also has a history of cerebral thrombosis.  He was hospitalized for acute on chronic systolic heart failure in December 2018.  Echocardiogram on 03/08/17 demonstrated severely reduced left ventricular systolic function, LVEF 28-31%.  Apical thrombus was noted.  There was also grade 1 diastolic dysfunction.  He denies chest pain.  He does have some stable exertional dyspnea.  He denies orthopnea, leg swelling, palpitations, and paroxysmal nocturnal dyspnea.  He walks 3-4 laps around [employer] at least a few times per week.  He has some decrease in right sided peripheral vision since his stroke.  CT angiogram of the neck on 03/07/17 demonstrated no evidence of internal carotid artery stenosis bilaterally.  He will soon be receiving CPAP.  Review of Systems: As per "subjective", otherwise negative.  Allergies  Allergen Reactions  . Lisinopril     Mouth and tongue swells    Current Outpatient Medications  Medication Sig Dispense Refill  . apixaban (ELIQUIS) 5 MG TABS tablet Take 1 tablet (5 mg total) by mouth 2 (two) times daily. 60 tablet 0  . atorvastatin (LIPITOR) 40 MG tablet Take 1 tablet (40 mg total) by mouth daily at 6 PM. 30 tablet 0  . cycloSPORINE (RESTASIS) 0.05 % ophthalmic emulsion Place 1 drop into both eyes daily as needed.    . fluticasone (FLONASE) 50 MCG/ACT nasal spray Place 2 sprays into both nostrils 2 (two) times daily as needed for allergies or rhinitis.    . furosemide (LASIX) 20 MG tablet Take 1 tablet (20 mg total) by mouth daily. 30 tablet 0  . metoprolol succinate (TOPROL-XL) 50 MG 24 hr tablet Take 50 mg by mouth daily.  3  . neomycin-polymyxin b-dexamethasone (MAXITROL) 3.5-10000-0.1 OINT Apply 3-4 times a day to surgical site for 1 week    . nitroGLYCERIN (NITROSTAT)  0.4 MG SL tablet Place 1 tablet (0.4 mg total) under the tongue every 5 (five) minutes as needed for chest pain. up to 3 doses. 25 tablet 4   No current facility-administered medications for this visit.     Past Medical History:  Diagnosis Date  . Arteriosclerotic cardiovascular disease (ASCVD) 1998   Inferior MI->PCI of the RCA in 1998; acute anterior MI in 06/2002->  PCI of RCA and LAD with DES x2 to RCA, residual 80% ostial D1, and 80% mid CX and EF-40%; CABG-2011, LIMA-LAD, SVG to D1, OM1 & OM2; EF of 35-40% in 07/2010  . Chronic anticoagulation   . Hyperlipidemia   . Keloid    median sternotomy  . Pulmonary embolism ([place]) 03/2010  . 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 COLONOSCOPY N/A 01/22/2017   Procedure: COLONOSCOPY;  Surgeon: Danie Binder, MD;  [facility];  Service: Endoscopy;  Laterality: N/A;  200  . CORONARY ARTERY BYPASS GRAFT  03/18/2010   LIMA-LAD, SVG to diagonal, OM1 & OM2  . POLYPECTOMY  01/22/2017   Procedure: POLYPECTOMY;  Surgeon: Danie Binder, MD;  [facility];  Service: Endoscopy;;  Transverse(CS) and sigmoid colon(HS)  . PTCA  06/1996   LAD & RCA  . TEE WITHOUT CARDIOVERSION N/A 06/10/2012   Procedure: TRANSESOPHAGEAL ECHOCARDIOGRAM (TEE);  Surgeon: Josue Hector, MD;  [facility];  Service: Cardiovascular;  Laterality:  N/A;    Social History   Socioeconomic History  . Marital status: Widowed    Spouse name: Not on file  . Number of children: Not on file  . Years of education: Not on file  . Highest education level: Not on file  Social Needs  . Financial resource strain: Not on file  . Food insecurity - worry: Not on file  . Food insecurity - inability: Not on file  . Transportation needs - medical: Not on file  . Transportation needs - non-medical: Not on file  Occupational History  . Occupation: disability  Tobacco Use  . Smoking status:  Former Smoker    Last attempt to quit: 2004    Years since quitting: 15.1  . Smokeless tobacco: Never Used  Substance and Sexual Activity  . Alcohol use: Yes    Comment: rare  . Drug use: No    Comment: Former cocaine abuse - last use for 51th birthday  . Sexual activity: Not on file  Other Topics Concern  . Not on file  Social History Narrative  . Not on file     Vitals:   05/18/17 1137  BP: (!) 142/80  Pulse: 71  SpO2: 96%  Weight: 219 lb 9.6 oz (99.6 kg)  Height: 5\' 6"  (1.676 m)    Wt Readings from Last 3 Encounters:  05/18/17 219 lb 9.6 oz (99.6 kg)  03/20/17 213 lb 11.2 oz (96.9 kg)  03/07/17 212 lb 4.9 oz (96.3 kg)     PHYSICAL EXAM General: NAD HEENT: Normal. Neck: No JVD, no thyromegaly. Lungs: Clear to auscultation bilaterally with normal respiratory effort. CV: Regular rate and rhythm, normal S1/S2, no S3/S4, no murmur. No pretibial or periankle edema.  No carotid bruit.   Abdomen: Soft, nontender, no distention.  Neurologic: Alert and oriented.  Psych: Normal affect. Skin: Normal. Musculoskeletal: No gross deformities.    ECG: Most recent ECG reviewed.   Labs: Lab Results  Component Value Date/Time   K 3.7 03/21/2017 06:37 AM   BUN 13 03/21/2017 06:37 AM   CREATININE 1.30 (H) 03/21/2017 06:37 AM   ALT 39 03/20/2017 06:00 AM   TSH 1.370 03/07/2017 02:57 PM   TSH 1.043 06/07/2012 09:58 AM   HGB 14.9 03/20/2017 06:00 AM     Lipids: Lab Results  Component Value Date/Time   LDLCALC 58 03/09/2017 04:58 AM   CHOL 116 03/09/2017 04:58 AM   TRIG 76 03/09/2017 04:58 AM   HDL 43 03/09/2017 04:58 AM       ASSESSMENT AND PLAN:  1. CAD with CABG: Stable ischemic heart disease. Continue metoprolol and atorvastatin 40 mg.  Allergic to ACE inhibitors/angiotensin receptor blockers.  Given decline in LV function, I will obtain a nuclear stress test to evaluate for significant ischemia.  2. Hyperlipidemia: I will obtain a copy of most recent lipids.  Continue statin therapy.  3. H/o CVA:  Currently on Eliquis 5 mg twice daily.  4. Essential HTN: Blood pressure is mildly elevated.  Allergic to ACE inhibitors/angiotensin receptor blockers.  I will start BiDil 20/37.5 mg 3 times daily.  5.  Chronic systolic heart failure: Currently on Lasix 20 mg daily and Toprol-XL 50 mg daily.  Allergic to ACE inhibitors/angiotensin receptor blockers. Blood pressure is mildly elevated.  I will start BiDil 20/37.5 mg 3 times daily.  6. Apical thrombus: Currently on Eliquis 5 mg twice daily.  7.  Cardiomyopathy with LVEF 30-35%: Given decline in LV function, I will obtain a nuclear stress  test to evaluate for significant ischemia.  If only scar is noted, I will make a referral to EP for ICD candidacy.    Disposition: Follow up 2 months   Time spent: 40 minutes, of which greater than 50% was spent reviewing symptoms, relevant blood tests and studies, and discussing management plan with the patient.   Kate Sable, M.D., F.A.C.C.

## 2017-05-20 DIAGNOSIS — I10 Essential (primary) hypertension: Secondary | ICD-10-CM | POA: Diagnosis not present

## 2017-05-20 DIAGNOSIS — I2510 Atherosclerotic heart disease of native coronary artery without angina pectoris: Secondary | ICD-10-CM | POA: Diagnosis not present

## 2017-05-24 DIAGNOSIS — G4733 Obstructive sleep apnea (adult) (pediatric): Secondary | ICD-10-CM | POA: Diagnosis not present

## 2017-05-25 DIAGNOSIS — I2510 Atherosclerotic heart disease of native coronary artery without angina pectoris: Secondary | ICD-10-CM | POA: Diagnosis not present

## 2017-05-25 DIAGNOSIS — G4733 Obstructive sleep apnea (adult) (pediatric): Secondary | ICD-10-CM | POA: Diagnosis not present

## 2017-05-25 DIAGNOSIS — I10 Essential (primary) hypertension: Secondary | ICD-10-CM | POA: Diagnosis not present

## 2017-05-26 ENCOUNTER — Encounter (HOSPITAL_COMMUNITY)
Admission: RE | Admit: 2017-05-26 | Discharge: 2017-05-26 | Disposition: A | Discharge status: Home / Self Care | Payer: Medicare HMO | Source: Ambulatory Visit | Attending: Cardiovascular Disease | Admitting: Cardiovascular Disease

## 2017-05-26 ENCOUNTER — Encounter (HOSPITAL_COMMUNITY): Payer: Self-pay

## 2017-05-26 ENCOUNTER — Encounter (HOSPITAL_BASED_OUTPATIENT_CLINIC_OR_DEPARTMENT_OTHER)
Admission: RE | Admit: 2017-05-26 | Discharge: 2017-05-26 | Disposition: A | Discharge status: Home / Self Care | Payer: Medicare HMO | Source: Ambulatory Visit | Attending: Cardiovascular Disease | Admitting: Cardiovascular Disease

## 2017-05-26 DIAGNOSIS — I429 Cardiomyopathy, unspecified: Secondary | ICD-10-CM

## 2017-05-26 DIAGNOSIS — I5022 Chronic systolic (congestive) heart failure: Secondary | ICD-10-CM

## 2017-05-26 LAB — NM MYOCAR MULTI W/SPECT W/WALL MOTION / EF
LV dias vol: 131 mL (ref 62–150)
LV sys vol: 74 mL
Peak HR: 81 {beats}/min
RATE: 0.34
Rest HR: 57 {beats}/min
SDS: 3
SRS: 13
SSS: 16
TID: 1.17

## 2017-05-26 MED ORDER — TECHNETIUM TC 99M TETROFOSMIN IV KIT
30.0000 | PACK | Freq: Once | INTRAVENOUS | Status: AC | PRN
Start: 2017-05-26 — End: 2017-05-26
  Administered 2017-05-26: 33 via INTRAVENOUS

## 2017-05-26 MED ORDER — REGADENOSON 0.4 MG/5ML IV SOLN
INTRAVENOUS | Status: AC
  Administered 2017-05-26: 0.4 mg via INTRAVENOUS
  Filled 2017-05-26: qty 5

## 2017-05-26 MED ORDER — SODIUM CHLORIDE 0.9% FLUSH
INTRAVENOUS | Status: AC
  Administered 2017-05-26: 10 mL via INTRAVENOUS
  Filled 2017-05-26: qty 10

## 2017-05-26 MED ORDER — TECHNETIUM TC 99M TETROFOSMIN IV KIT
10.0000 | PACK | Freq: Once | INTRAVENOUS | Status: AC | PRN
  Administered 2017-05-26: 11 via INTRAVENOUS

## 2017-06-01 DIAGNOSIS — G4733 Obstructive sleep apnea (adult) (pediatric): Secondary | ICD-10-CM | POA: Diagnosis not present

## 2017-06-01 DIAGNOSIS — I10 Essential (primary) hypertension: Secondary | ICD-10-CM | POA: Diagnosis not present

## 2017-06-01 DIAGNOSIS — I2510 Atherosclerotic heart disease of native coronary artery without angina pectoris: Secondary | ICD-10-CM | POA: Diagnosis not present

## 2017-06-05 DIAGNOSIS — I2510 Atherosclerotic heart disease of native coronary artery without angina pectoris: Secondary | ICD-10-CM | POA: Diagnosis not present

## 2017-06-05 DIAGNOSIS — G4733 Obstructive sleep apnea (adult) (pediatric): Secondary | ICD-10-CM | POA: Diagnosis not present

## 2017-06-05 DIAGNOSIS — I10 Essential (primary) hypertension: Secondary | ICD-10-CM | POA: Diagnosis not present

## 2017-06-08 DIAGNOSIS — I2510 Atherosclerotic heart disease of native coronary artery without angina pectoris: Secondary | ICD-10-CM | POA: Diagnosis not present

## 2017-06-08 DIAGNOSIS — I10 Essential (primary) hypertension: Secondary | ICD-10-CM | POA: Diagnosis not present

## 2017-06-08 DIAGNOSIS — G4733 Obstructive sleep apnea (adult) (pediatric): Secondary | ICD-10-CM | POA: Diagnosis not present

## 2017-06-15 DIAGNOSIS — G4733 Obstructive sleep apnea (adult) (pediatric): Secondary | ICD-10-CM | POA: Diagnosis not present

## 2017-06-15 DIAGNOSIS — I10 Essential (primary) hypertension: Secondary | ICD-10-CM | POA: Diagnosis not present

## 2017-06-15 DIAGNOSIS — I2510 Atherosclerotic heart disease of native coronary artery without angina pectoris: Secondary | ICD-10-CM | POA: Diagnosis not present

## 2017-06-17 DIAGNOSIS — I5022 Chronic systolic (congestive) heart failure: Secondary | ICD-10-CM | POA: Diagnosis not present

## 2017-06-17 DIAGNOSIS — I2510 Atherosclerotic heart disease of native coronary artery without angina pectoris: Secondary | ICD-10-CM | POA: Diagnosis not present

## 2017-06-22 DIAGNOSIS — I10 Essential (primary) hypertension: Secondary | ICD-10-CM | POA: Diagnosis not present

## 2017-06-22 DIAGNOSIS — G4733 Obstructive sleep apnea (adult) (pediatric): Secondary | ICD-10-CM | POA: Diagnosis not present

## 2017-06-22 DIAGNOSIS — I2510 Atherosclerotic heart disease of native coronary artery without angina pectoris: Secondary | ICD-10-CM | POA: Diagnosis not present

## 2017-06-25 ENCOUNTER — Encounter: Payer: Self-pay | Admitting: Cardiovascular Disease

## 2017-06-29 DIAGNOSIS — G4733 Obstructive sleep apnea (adult) (pediatric): Secondary | ICD-10-CM | POA: Diagnosis not present

## 2017-06-29 DIAGNOSIS — I10 Essential (primary) hypertension: Secondary | ICD-10-CM | POA: Diagnosis not present

## 2017-06-29 DIAGNOSIS — I2510 Atherosclerotic heart disease of native coronary artery without angina pectoris: Secondary | ICD-10-CM | POA: Diagnosis not present

## 2017-07-24 ENCOUNTER — Ambulatory Visit: Payer: Self-pay | Admitting: Cardiovascular Disease

## 2017-07-28 DIAGNOSIS — G4733 Obstructive sleep apnea (adult) (pediatric): Secondary | ICD-10-CM | POA: Diagnosis not present

## 2017-07-28 DIAGNOSIS — Z0001 Encounter for general adult medical examination with abnormal findings: Secondary | ICD-10-CM | POA: Diagnosis not present

## 2017-07-28 DIAGNOSIS — I2510 Atherosclerotic heart disease of native coronary artery without angina pectoris: Secondary | ICD-10-CM | POA: Diagnosis not present

## 2017-07-28 DIAGNOSIS — Z1389 Encounter for screening for other disorder: Secondary | ICD-10-CM | POA: Diagnosis not present

## 2017-07-28 DIAGNOSIS — I10 Essential (primary) hypertension: Secondary | ICD-10-CM | POA: Diagnosis not present

## 2017-07-28 DIAGNOSIS — Z1331 Encounter for screening for depression: Secondary | ICD-10-CM | POA: Diagnosis not present

## 2017-07-30 DIAGNOSIS — Z0100 Encounter for examination of eyes and vision without abnormal findings: Secondary | ICD-10-CM | POA: Diagnosis not present

## 2017-07-30 DIAGNOSIS — H25813 Combined forms of age-related cataract, bilateral: Secondary | ICD-10-CM | POA: Diagnosis not present

## 2017-07-30 DIAGNOSIS — H5200 Hypermetropia, unspecified eye: Secondary | ICD-10-CM | POA: Diagnosis not present

## 2017-08-03 ENCOUNTER — Ambulatory Visit (INDEPENDENT_AMBULATORY_CARE_PROVIDER_SITE_OTHER): Payer: Medicare HMO | Admitting: Cardiovascular Disease

## 2017-08-03 ENCOUNTER — Encounter: Payer: Self-pay | Admitting: Cardiovascular Disease

## 2017-08-03 VITALS — BP 140/78 | HR 90 | Ht 66.0 in | Wt 219.0 lb

## 2017-08-03 DIAGNOSIS — G4733 Obstructive sleep apnea (adult) (pediatric): Secondary | ICD-10-CM | POA: Diagnosis not present

## 2017-08-03 DIAGNOSIS — I10 Essential (primary) hypertension: Secondary | ICD-10-CM

## 2017-08-03 DIAGNOSIS — I513 Intracardiac thrombosis, not elsewhere classified: Secondary | ICD-10-CM

## 2017-08-03 DIAGNOSIS — R739 Hyperglycemia, unspecified: Secondary | ICD-10-CM | POA: Diagnosis not present

## 2017-08-03 DIAGNOSIS — I6350 Cerebral infarction due to unspecified occlusion or stenosis of unspecified cerebral artery: Secondary | ICD-10-CM | POA: Diagnosis not present

## 2017-08-03 DIAGNOSIS — N183 Chronic kidney disease, stage 3 (moderate): Secondary | ICD-10-CM | POA: Diagnosis not present

## 2017-08-03 DIAGNOSIS — R9439 Abnormal result of other cardiovascular function study: Secondary | ICD-10-CM

## 2017-08-03 DIAGNOSIS — I429 Cardiomyopathy, unspecified: Secondary | ICD-10-CM

## 2017-08-03 DIAGNOSIS — Z8673 Personal history of transient ischemic attack (TIA), and cerebral infarction without residual deficits: Secondary | ICD-10-CM

## 2017-08-03 DIAGNOSIS — I25768 Atherosclerosis of bypass graft of coronary artery of transplanted heart with other forms of angina pectoris: Secondary | ICD-10-CM

## 2017-08-03 DIAGNOSIS — I499 Cardiac arrhythmia, unspecified: Secondary | ICD-10-CM | POA: Diagnosis not present

## 2017-08-03 DIAGNOSIS — I5022 Chronic systolic (congestive) heart failure: Secondary | ICD-10-CM

## 2017-08-03 DIAGNOSIS — Z0000 Encounter for general adult medical examination without abnormal findings: Secondary | ICD-10-CM | POA: Diagnosis not present

## 2017-08-03 DIAGNOSIS — I2510 Atherosclerotic heart disease of native coronary artery without angina pectoris: Secondary | ICD-10-CM | POA: Diagnosis not present

## 2017-08-03 DIAGNOSIS — E782 Mixed hyperlipidemia: Secondary | ICD-10-CM | POA: Diagnosis not present

## 2017-08-03 DIAGNOSIS — E669 Obesity, unspecified: Secondary | ICD-10-CM | POA: Diagnosis not present

## 2017-08-03 NOTE — H&P (View-Only) (Signed)
SUBJECTIVE: The patient presents for routine follow-up. Nuclear stress test 05/26/2017 demonstrated myocardial scar in the apical anteroseptal region.  There was a partially reversible defect in the inferolateral region extending from the apex to base which appear to be consistent with scar and at least a moderate degree of peri-infarct ischemia.  It was deemed an intermediate risk study.  He is feeling well overall.  He denies chest pain.  He appears to have stable chronic exertional dyspnea.  He denies orthopnea, leg swelling, palpitations, and paroxysmal nocturnal dyspnea.  He tries to do laps around [place] several days a week.  He sometimes forgets to take the afternoon dose of BiDil.   Review of Systems: As per "subjective", otherwise negative.  Allergies  Allergen Reactions  . Lisinopril     Mouth and tongue swells    Current Outpatient Medications  Medication Sig Dispense Refill  . apixaban (ELIQUIS) 5 MG TABS tablet Take 1 tablet (5 mg total) by mouth 2 (two) times daily. 60 tablet 0  . atorvastatin (LIPITOR) 40 MG tablet Take 1 tablet (40 mg total) by mouth daily at 6 PM. 30 tablet 0  . cycloSPORINE (RESTASIS) 0.05 % ophthalmic emulsion Place 1 drop into both eyes daily as needed.    . fluticasone (FLONASE) 50 MCG/ACT nasal spray Place 2 sprays into both nostrils 2 (two) times daily as needed for allergies or rhinitis.    . furosemide (LASIX) 20 MG tablet Take 1 tablet (20 mg total) by mouth daily. 30 tablet 0  . isosorbide-hydrALAZINE (BIDIL) 20-37.5 MG tablet Take 1 tablet by mouth 3 (three) times daily. 90 tablet 6  . metoprolol succinate (TOPROL-XL) 50 MG 24 hr tablet Take 50 mg by mouth daily.  3  . neomycin-polymyxin b-dexamethasone (MAXITROL) 3.5-10000-0.1 OINT Apply 3-4 times a day to surgical site for 1 week    . nitroGLYCERIN (NITROSTAT) 0.4 MG SL tablet Place 1 tablet (0.4 mg total) under the tongue every 5 (five) minutes as needed for chest pain. up to 3  doses. 25 tablet 4   No current facility-administered medications for this visit.     Past Medical History:  Diagnosis Date  . Arteriosclerotic cardiovascular disease (ASCVD) 1998   Inferior MI->PCI of the RCA in 1998; acute anterior MI in 06/2002->  PCI of RCA and LAD with DES x2 to RCA, residual 80% ostial D1, and 80% mid CX and EF-40%; CABG-2011, LIMA-LAD, SVG to D1, OM1 & OM2; EF of 35-40% in 07/2010  . Chronic anticoagulation   . Hyperlipidemia   . Keloid    median sternotomy  . Pulmonary embolism ([place]) 03/2010  . 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 COLONOSCOPY N/A 01/22/2017   Procedure: COLONOSCOPY;  Surgeon: Danie Binder, MD;  [facility];  Service: Endoscopy;  Laterality: N/A;  200  . CORONARY ARTERY BYPASS GRAFT  03/18/2010   LIMA-LAD, SVG to diagonal, OM1 & OM2  . POLYPECTOMY  01/22/2017   Procedure: POLYPECTOMY;  Surgeon: Danie Binder, MD;  [facility];  Service: Endoscopy;;  Transverse(CS) and sigmoid colon(HS)  . PTCA  06/1996   LAD & RCA  . TEE WITHOUT CARDIOVERSION N/A 06/10/2012   Procedure: TRANSESOPHAGEAL ECHOCARDIOGRAM (TEE);  Surgeon: Josue Hector, MD;  [facility];  Service: Cardiovascular;  Laterality: N/A;    Social History   Socioeconomic History  . Marital status: Widowed  Spouse name: Not on file  . Number of children: Not on file  . Years of education: Not on file  . Highest education level: Not on file  Occupational History  . Occupation: disability  Social Needs  . Financial resource strain: Not on file  . Food insecurity:    Worry: Not on file    Inability: Not on file  . Transportation needs:    Medical: Not on file    Non-medical: Not on file  Tobacco Use  . Smoking status: Former Smoker    Last attempt to quit: 2004    Years since quitting: 15.3  . Smokeless tobacco: Never Used  Substance and Sexual Activity  . Alcohol use:  Yes    Comment: rare  . Drug use: No    Types: Cocaine    Comment: Former cocaine abuse - last use for 51th birthday  . Sexual activity: Not on file  Lifestyle  . Physical activity:    Days per week: Not on file    Minutes per session: Not on file  . Stress: Not on file  Relationships  . Social connections:    Talks on phone: Not on file    Gets together: Not on file    Attends religious service: Not on file    Active member of club or organization: Not on file    Attends meetings of clubs or organizations: Not on file    Relationship status: Not on file  . Intimate partner violence:    Fear of current or ex partner: Not on file    Emotionally abused: Not on file    Physically abused: Not on file    Forced sexual activity: Not on file  Other Topics Concern  . Not on file  Social History Narrative  . Not on file     Vitals:   08/03/17 1251  BP: 140/78  Pulse: 90  SpO2: 97%  Weight: 219 lb (99.3 kg)  Height: 5\' 6"  (1.676 m)    Wt Readings from Last 3 Encounters:  08/03/17 219 lb (99.3 kg)  05/18/17 219 lb 9.6 oz (99.6 kg)  03/20/17 213 lb 11.2 oz (96.9 kg)     PHYSICAL EXAM General: NAD HEENT: Normal. Neck: No JVD, no thyromegaly. Lungs: Clear to auscultation bilaterally with normal respiratory effort. CV: Regular rate and rhythm, normal S1/S2, no S3/S4, no murmur. No pretibial or periankle edema.   Abdomen: Soft, nontender, no distention.  Neurologic: Alert and oriented.  Psych: Normal affect. Skin: Normal. Musculoskeletal: No gross deformities.    ECG: Most recent ECG reviewed.   Labs: Lab Results  Component Value Date/Time   K 3.7 03/21/2017 06:37 AM   BUN 13 03/21/2017 06:37 AM   CREATININE 1.30 (H) 03/21/2017 06:37 AM   ALT 39 03/20/2017 06:00 AM   TSH 1.370 03/07/2017 02:57 PM   TSH 1.043 06/07/2012 09:58 AM   HGB 14.9 03/20/2017 06:00 AM     Lipids: Lab Results  Component Value Date/Time   LDLCALC 58 03/09/2017 04:58 AM   CHOL 116  03/09/2017 04:58 AM   TRIG 76 03/09/2017 04:58 AM   HDL 43 03/09/2017 04:58 AM       ASSESSMENT AND PLAN: 1. CAD with CABG with abnormal stress test:Stable ischemic heart disease. Continue metoprolol and atorvastatin 40 mg.  Allergic to ACE inhibitors/angiotensin receptor blockers.  Given decline in LV function, I obtained a nuclear stress test which demonstrated a moderate degree of inferolateral ischemia.  I will arrange  for a follow-up limited echocardiogram with contrast to assess for interval improvement in LVEF.  If it remains significantly reduced, I will arrange for coronary angiography.  2. Hyperlipidemia:I will obtain a copy of most recent lipids. Continue statin therapy.  3. H/o CVA: Currently on Eliquis 5 mg twice daily.  4. Essential HTN: Blood pressure is mildly elevated.  Allergic to ACE inhibitors/angiotensin receptor blockers. Continue BiDil 20/37.5 mg 3 times daily.  5.  Chronic systolic heart failure: Currently on Lasix 20 mg daily and Toprol-XL 50 mg daily.  Allergic to ACE inhibitors/angiotensin receptor blockers. Blood pressure is mildly elevated.  I will continue BiDil 20/37.5 mg 3 times daily.    6. Apical thrombus: Currently on Eliquis 5 mg twice daily (also with a history of cerebral thrombosis).  7.  Cardiomyopathy with LVEF 30-35%: I will arrange for a follow-up limited echocardiogram with contrast to assess for interval improvement in LVEF.  If it remains significantly reduced, I will arrange for coronary angiography.   Disposition: Follow up 3 months   Kate Sable, M.D., F.A.C.C.  Addendum: LVEF remains reduced. I have arranged for coronary angiography. Risks and benefits of cardiac catheterization have been discussed with the patient.  These include bleeding, infection, kidney damage, stroke, heart attack, death.  The patient understands these risks and is willing to proceed.

## 2017-08-03 NOTE — Progress Notes (Addendum)
SUBJECTIVE: The patient presents for routine follow-up. Nuclear stress test 05/26/2017 demonstrated myocardial scar in the apical anteroseptal region.  There was a partially reversible defect in the inferolateral region extending from the apex to base which appear to be consistent with scar and at least a moderate degree of peri-infarct ischemia.  It was deemed an intermediate risk study.  He is feeling well overall.  He denies chest pain.  He appears to have stable chronic exertional dyspnea.  He denies orthopnea, leg swelling, palpitations, and paroxysmal nocturnal dyspnea.  He tries to do laps around [place] several days a week.  He sometimes forgets to take the afternoon dose of BiDil.   Review of Systems: As per "subjective", otherwise negative.  Allergies  Allergen Reactions  . Lisinopril     Mouth and tongue swells    Current Outpatient Medications  Medication Sig Dispense Refill  . apixaban (ELIQUIS) 5 MG TABS tablet Take 1 tablet (5 mg total) by mouth 2 (two) times daily. 60 tablet 0  . atorvastatin (LIPITOR) 40 MG tablet Take 1 tablet (40 mg total) by mouth daily at 6 PM. 30 tablet 0  . cycloSPORINE (RESTASIS) 0.05 % ophthalmic emulsion Place 1 drop into both eyes daily as needed.    . fluticasone (FLONASE) 50 MCG/ACT nasal spray Place 2 sprays into both nostrils 2 (two) times daily as needed for allergies or rhinitis.    . furosemide (LASIX) 20 MG tablet Take 1 tablet (20 mg total) by mouth daily. 30 tablet 0  . isosorbide-hydrALAZINE (BIDIL) 20-37.5 MG tablet Take 1 tablet by mouth 3 (three) times daily. 90 tablet 6  . metoprolol succinate (TOPROL-XL) 50 MG 24 hr tablet Take 50 mg by mouth daily.  3  . neomycin-polymyxin b-dexamethasone (MAXITROL) 3.5-10000-0.1 OINT Apply 3-4 times a day to surgical site for 1 week    . nitroGLYCERIN (NITROSTAT) 0.4 MG SL tablet Place 1 tablet (0.4 mg total) under the tongue every 5 (five) minutes as needed for chest pain. up to 3  doses. 25 tablet 4   No current facility-administered medications for this visit.     Past Medical History:  Diagnosis Date  . Arteriosclerotic cardiovascular disease (ASCVD) 1998   Inferior MI->PCI of the RCA in 1998; acute anterior MI in 06/2002->  PCI of RCA and LAD with DES x2 to RCA, residual 80% ostial D1, and 80% mid CX and EF-40%; CABG-2011, LIMA-LAD, SVG to D1, OM1 & OM2; EF of 35-40% in 07/2010  . Chronic anticoagulation   . Hyperlipidemia   . Keloid    median sternotomy  . Pulmonary embolism ([place]) 03/2010  . Substance abuse [facility])    formerly cocaine    Past Surgical History:  Procedure Laterality Date  . COLONOSCOPY N/A 01/22/2017   Procedure: COLONOSCOPY;  Surgeon: Danie Binder, MD;  Location: [facility];  Service: Endoscopy;  Laterality: N/A;  200  . CORONARY ARTERY BYPASS GRAFT  03/18/2010   LIMA-LAD, SVG to diagonal, OM1 & OM2  . POLYPECTOMY  01/22/2017   Procedure: POLYPECTOMY;  Surgeon: Danie Binder, MD;  Location: [facility];  Service: Endoscopy;;  Transverse(CS) and sigmoid colon(HS)  . PTCA  06/1996   LAD & RCA  . TEE WITHOUT CARDIOVERSION N/A 06/10/2012   Procedure: TRANSESOPHAGEAL ECHOCARDIOGRAM (TEE);  Surgeon: Josue Hector, MD;  Location: [facility];  Service: Cardiovascular;  Laterality: N/A;    Social History   Socioeconomic History  . Marital status: Widowed  Spouse name: Not on file  . Number of children: Not on file  . Years of education: Not on file  . Highest education level: Not on file  Occupational History  . Occupation: disability  Social Needs  . Financial resource strain: Not on file  . Food insecurity:    Worry: Not on file    Inability: Not on file  . Transportation needs:    Medical: Not on file    Non-medical: Not on file  Tobacco Use  . Smoking status: Former Smoker    Last attempt to quit: 2004    Years since quitting: 15.3  . Smokeless tobacco: Never Used  Substance and Sexual Activity  . Alcohol use:  Yes    Comment: rare  . Drug use: No    Types: Cocaine    Comment: Former cocaine abuse - last use for 51th birthday  . Sexual activity: Not on file  Lifestyle  . Physical activity:    Days per week: Not on file    Minutes per session: Not on file  . Stress: Not on file  Relationships  . Social connections:    Talks on phone: Not on file    Gets together: Not on file    Attends religious service: Not on file    Active member of club or organization: Not on file    Attends meetings of clubs or organizations: Not on file    Relationship status: Not on file  . Intimate partner violence:    Fear of current or ex partner: Not on file    Emotionally abused: Not on file    Physically abused: Not on file    Forced sexual activity: Not on file  Other Topics Concern  . Not on file  Social History Narrative  . Not on file     Vitals:   08/03/17 1251  BP: 140/78  Pulse: 90  SpO2: 97%  Weight: 219 lb (99.3 kg)  Height: 5\' 6"  (1.676 m)    Wt Readings from Last 3 Encounters:  08/03/17 219 lb (99.3 kg)  05/18/17 219 lb 9.6 oz (99.6 kg)  03/20/17 213 lb 11.2 oz (96.9 kg)     PHYSICAL EXAM General: NAD HEENT: Normal. Neck: No JVD, no thyromegaly. Lungs: Clear to auscultation bilaterally with normal respiratory effort. CV: Regular rate and rhythm, normal S1/S2, no S3/S4, no murmur. No pretibial or periankle edema.   Abdomen: Soft, nontender, no distention.  Neurologic: Alert and oriented.  Psych: Normal affect. Skin: Normal. Musculoskeletal: No gross deformities.    ECG: Most recent ECG reviewed.   Labs: Lab Results  Component Value Date/Time   K 3.7 03/21/2017 06:37 AM   BUN 13 03/21/2017 06:37 AM   CREATININE 1.30 (H) 03/21/2017 06:37 AM   ALT 39 03/20/2017 06:00 AM   TSH 1.370 03/07/2017 02:57 PM   TSH 1.043 06/07/2012 09:58 AM   HGB 14.9 03/20/2017 06:00 AM     Lipids: Lab Results  Component Value Date/Time   LDLCALC 58 03/09/2017 04:58 AM   CHOL 116  03/09/2017 04:58 AM   TRIG 76 03/09/2017 04:58 AM   HDL 43 03/09/2017 04:58 AM       ASSESSMENT AND PLAN: 1. CAD with CABG with abnormal stress test:Stable ischemic heart disease. Continue metoprolol and atorvastatin 40 mg.  Allergic to ACE inhibitors/angiotensin receptor blockers.  Given decline in LV function, I obtained a nuclear stress test which demonstrated a moderate degree of inferolateral ischemia.  I will arrange  for a follow-up limited echocardiogram with contrast to assess for interval improvement in LVEF.  If it remains significantly reduced, I will arrange for coronary angiography.  2. Hyperlipidemia:I will obtain a copy of most recent lipids. Continue statin therapy.  3. H/o CVA: Currently on Eliquis 5 mg twice daily.  4. Essential HTN: Blood pressure is mildly elevated.  Allergic to ACE inhibitors/angiotensin receptor blockers. Continue BiDil 20/37.5 mg 3 times daily.  5.  Chronic systolic heart failure: Currently on Lasix 20 mg daily and Toprol-XL 50 mg daily.  Allergic to ACE inhibitors/angiotensin receptor blockers. Blood pressure is mildly elevated.  I will continue BiDil 20/37.5 mg 3 times daily.    6. Apical thrombus: Currently on Eliquis 5 mg twice daily (also with a history of cerebral thrombosis).  7.  Cardiomyopathy with LVEF 30-35%: I will arrange for a follow-up limited echocardiogram with contrast to assess for interval improvement in LVEF.  If it remains significantly reduced, I will arrange for coronary angiography.   Disposition: Follow up 3 months   Kate Sable, M.D., F.A.C.C.  Addendum: LVEF remains reduced. I have arranged for coronary angiography. Risks and benefits of cardiac catheterization have been discussed with the patient.  These include bleeding, infection, kidney damage, stroke, heart attack, death.  The patient understands these risks and is willing to proceed.

## 2017-08-03 NOTE — Patient Instructions (Signed)
Your physician recommends that you schedule a follow-up appointment in:  3 months with [name]     Your physician has requested that you have an echocardiogram(LIMITED WITH CONTRAST). Echocardiography is a painless test that uses sound waves to create images of your heart. It provides your doctor with information about the size and shape of your heart and how well your heart's chambers and valves are working. This procedure takes approximately one hour. There are no restrictions for this procedure.      Your physician recommends that you continue on your current medications as directed. Please refer to the Current Medication list given to you today.     Thank you for choosing [facility] !

## 2017-08-11 ENCOUNTER — Ambulatory Visit (HOSPITAL_COMMUNITY)
Admission: RE | Admit: 2017-08-11 | Discharge: 2017-08-11 | Disposition: A | Discharge status: Home / Self Care | Payer: Medicare HMO | Source: Ambulatory Visit | Attending: Cardiovascular Disease | Admitting: Cardiovascular Disease

## 2017-08-11 DIAGNOSIS — I429 Cardiomyopathy, unspecified: Secondary | ICD-10-CM | POA: Diagnosis not present

## 2017-08-11 DIAGNOSIS — I130 Hypertensive heart and chronic kidney disease with heart failure and stage 1 through stage 4 chronic kidney disease, or unspecified chronic kidney disease: Secondary | ICD-10-CM | POA: Diagnosis not present

## 2017-08-11 DIAGNOSIS — I509 Heart failure, unspecified: Secondary | ICD-10-CM | POA: Insufficient documentation

## 2017-08-11 DIAGNOSIS — N189 Chronic kidney disease, unspecified: Secondary | ICD-10-CM | POA: Diagnosis not present

## 2017-08-11 DIAGNOSIS — Z8673 Personal history of transient ischemic attack (TIA), and cerebral infarction without residual deficits: Secondary | ICD-10-CM | POA: Diagnosis not present

## 2017-08-11 MED ORDER — PERFLUTREN LIPID MICROSPHERE
1.0000 mL | INTRAVENOUS | Status: AC | PRN
  Administered 2017-08-11: 2 mL via INTRAVENOUS
  Filled 2017-08-11: qty 10

## 2017-08-11 NOTE — Progress Notes (Signed)
  Echocardiogram 2D Echocardiogram has been performed.  Chastin Garlitz L Androw 08/11/2017, 2:52 PM

## 2017-08-12 ENCOUNTER — Telehealth: Payer: Self-pay

## 2017-08-12 ENCOUNTER — Other Ambulatory Visit (HOSPITAL_COMMUNITY): Payer: Self-pay

## 2017-08-12 NOTE — Telephone Encounter (Signed)
Called pt to see what day he available to do hear cath. No answer, left message for him to return call.

## 2017-08-12 NOTE — Telephone Encounter (Signed)
-----   Message from Herminio Commons, MD sent at 08/12/2017  9:07 AM EDT ----- Heart function has improved to a mild degree but remains reduced. No blood clot in the heart. He will need left heart catheterization and coronary angiography.

## 2017-08-18 ENCOUNTER — Other Ambulatory Visit (HOSPITAL_COMMUNITY)
Admission: RE | Admit: 2017-08-18 | Discharge: 2017-08-18 | Disposition: A | Discharge status: Home / Self Care | Payer: Medicare HMO | Source: Ambulatory Visit | Attending: Cardiovascular Disease | Admitting: Cardiovascular Disease

## 2017-08-18 ENCOUNTER — Telehealth: Payer: Self-pay | Admitting: *Deleted

## 2017-08-18 ENCOUNTER — Other Ambulatory Visit: Payer: Self-pay | Admitting: Cardiovascular Disease

## 2017-08-18 ENCOUNTER — Encounter: Payer: Self-pay | Admitting: *Deleted

## 2017-08-18 DIAGNOSIS — Z01818 Encounter for other preprocedural examination: Secondary | ICD-10-CM | POA: Insufficient documentation

## 2017-08-18 DIAGNOSIS — R9439 Abnormal result of other cardiovascular function study: Secondary | ICD-10-CM

## 2017-08-18 DIAGNOSIS — I429 Cardiomyopathy, unspecified: Secondary | ICD-10-CM

## 2017-08-18 LAB — CBC WITH DIFFERENTIAL/PLATELET
Basophils Absolute: 0 10*3/uL (ref 0.0–0.1)
Basophils Relative: 0 %
Eosinophils Absolute: 0.1 10*3/uL (ref 0.0–0.7)
Eosinophils Relative: 2 %
HCT: 47.3 % (ref 39.0–52.0)
Hemoglobin: 15.3 g/dL (ref 13.0–17.0)
Lymphocytes Relative: 49 %
Lymphs Abs: 2.9 10*3/uL (ref 0.7–4.0)
MCH: 29.2 pg (ref 26.0–34.0)
MCHC: 32.3 g/dL (ref 30.0–36.0)
MCV: 90.3 fL (ref 78.0–100.0)
Monocytes Absolute: 0.4 10*3/uL (ref 0.1–1.0)
Monocytes Relative: 7 %
Neutro Abs: 2.5 10*3/uL (ref 1.7–7.7)
Neutrophils Relative %: 42 %
Platelets: 142 10*3/uL — ABNORMAL LOW (ref 150–400)
RBC: 5.24 MIL/uL (ref 4.22–5.81)
RDW: 13.5 % (ref 11.5–15.5)
WBC: 5.9 10*3/uL (ref 4.0–10.5)

## 2017-08-18 LAB — BASIC METABOLIC PANEL
Anion gap: 7 (ref 5–15)
BUN: 11 mg/dL (ref 6–20)
CO2: 29 mmol/L (ref 22–32)
Calcium: 8.6 mg/dL — ABNORMAL LOW (ref 8.9–10.3)
Chloride: 102 mmol/L (ref 101–111)
Creatinine, Ser: 1.43 mg/dL — ABNORMAL HIGH (ref 0.61–1.24)
GFR calc Af Amer: 55 mL/min — ABNORMAL LOW (ref >60)
GFR calc non Af Amer: 48 mL/min — ABNORMAL LOW (ref >60)
Glucose, Bld: 140 mg/dL — ABNORMAL HIGH (ref 65–99)
Potassium: 3.6 mmol/L (ref 3.5–5.1)
Sodium: 138 mmol/L (ref 135–145)

## 2017-08-18 NOTE — Telephone Encounter (Signed)
Cath scheduled for 5/17 /19 @ 0730 w/ Dr. Burt Knack

## 2017-08-18 NOTE — Telephone Encounter (Signed)
-----   Message from Drema Dallas, [place] sent at 08/12/2017  9:21 AM EDT -----   ----- Message ----- From: Herminio Commons, MD Sent: 08/12/2017   9:07 AM To: Drema Dallas, CMA  Heart function has improved to a mild degree but remains reduced. No blood clot in the heart. He will need left heart catheterization and coronary angiography.

## 2017-08-20 ENCOUNTER — Telehealth: Payer: Self-pay | Admitting: *Deleted

## 2017-08-20 NOTE — Telephone Encounter (Addendum)
Catheterization scheduled at [facility] for: Friday Aug 21, 2017 7:30 AM Verify arrival time and place: [place] Entrance A at: 5:30 AM  No solid food after midnight prior to cath, clear liquids until 5 AM day of procedure. Verify allergies in Epic. Verify no diabetes medications.  Hold: Furosemide 08/20/17 and 08/21/17. Eliquis 08/19/17 until post procedure.  AM meds can be  taken pre-cath with sip of water including: ASA 81 mg  Confirm patient has responsible person to drive home post procedure and observe patient for 24 hours:yes   Discussed instructions with patient, he verbalized understanding, thanked me for call.

## 2017-08-21 ENCOUNTER — Ambulatory Visit (HOSPITAL_COMMUNITY)
Admission: RE | Admit: 2017-08-21 | Discharge: 2017-08-21 | Disposition: A | Discharge status: Home / Self Care | Payer: Medicare HMO | Source: Ambulatory Visit | Attending: Cardiovascular Disease | Admitting: Cardiovascular Disease

## 2017-08-21 ENCOUNTER — Ambulatory Visit (HOSPITAL_COMMUNITY)
Admission: RE | Disposition: A | Discharge status: Home / Self Care | Payer: Self-pay | Source: Ambulatory Visit | Attending: Cardiovascular Disease

## 2017-08-21 DIAGNOSIS — Z9889 Other specified postprocedural states: Secondary | ICD-10-CM | POA: Insufficient documentation

## 2017-08-21 DIAGNOSIS — E785 Hyperlipidemia, unspecified: Secondary | ICD-10-CM | POA: Insufficient documentation

## 2017-08-21 DIAGNOSIS — G4733 Obstructive sleep apnea (adult) (pediatric): Secondary | ICD-10-CM | POA: Diagnosis present

## 2017-08-21 DIAGNOSIS — I429 Cardiomyopathy, unspecified: Secondary | ICD-10-CM

## 2017-08-21 DIAGNOSIS — I130 Hypertensive heart and chronic kidney disease with heart failure and stage 1 through stage 4 chronic kidney disease, or unspecified chronic kidney disease: Secondary | ICD-10-CM | POA: Diagnosis not present

## 2017-08-21 DIAGNOSIS — Z86718 Personal history of other venous thrombosis and embolism: Secondary | ICD-10-CM | POA: Insufficient documentation

## 2017-08-21 DIAGNOSIS — Z888 Allergy status to other drugs, medicaments and biological substances status: Secondary | ICD-10-CM | POA: Diagnosis not present

## 2017-08-21 DIAGNOSIS — I2510 Atherosclerotic heart disease of native coronary artery without angina pectoris: Secondary | ICD-10-CM | POA: Diagnosis present

## 2017-08-21 DIAGNOSIS — I5022 Chronic systolic (congestive) heart failure: Secondary | ICD-10-CM

## 2017-08-21 DIAGNOSIS — Z86711 Personal history of pulmonary embolism: Secondary | ICD-10-CM | POA: Insufficient documentation

## 2017-08-21 DIAGNOSIS — Z8673 Personal history of transient ischemic attack (TIA), and cerebral infarction without residual deficits: Secondary | ICD-10-CM | POA: Insufficient documentation

## 2017-08-21 DIAGNOSIS — Z79899 Other long term (current) drug therapy: Secondary | ICD-10-CM | POA: Insufficient documentation

## 2017-08-21 DIAGNOSIS — I252 Old myocardial infarction: Secondary | ICD-10-CM | POA: Diagnosis not present

## 2017-08-21 DIAGNOSIS — I236 Thrombosis of atrium, auricular appendage, and ventricle as current complications following acute myocardial infarction: Secondary | ICD-10-CM | POA: Insufficient documentation

## 2017-08-21 DIAGNOSIS — Z87891 Personal history of nicotine dependence: Secondary | ICD-10-CM | POA: Insufficient documentation

## 2017-08-21 DIAGNOSIS — R931 Abnormal findings on diagnostic imaging of heart and coronary circulation: Secondary | ICD-10-CM | POA: Diagnosis present

## 2017-08-21 DIAGNOSIS — I10 Essential (primary) hypertension: Secondary | ICD-10-CM | POA: Diagnosis present

## 2017-08-21 DIAGNOSIS — F1411 Cocaine abuse, in remission: Secondary | ICD-10-CM | POA: Insufficient documentation

## 2017-08-21 DIAGNOSIS — E782 Mixed hyperlipidemia: Secondary | ICD-10-CM | POA: Diagnosis present

## 2017-08-21 DIAGNOSIS — Z7901 Long term (current) use of anticoagulants: Secondary | ICD-10-CM | POA: Insufficient documentation

## 2017-08-21 DIAGNOSIS — Z955 Presence of coronary angioplasty implant and graft: Secondary | ICD-10-CM

## 2017-08-21 DIAGNOSIS — N183 Chronic kidney disease, stage 3 unspecified: Secondary | ICD-10-CM | POA: Diagnosis present

## 2017-08-21 DIAGNOSIS — N1831 Chronic kidney disease, stage 3a: Secondary | ICD-10-CM | POA: Diagnosis present

## 2017-08-21 DIAGNOSIS — Z951 Presence of aortocoronary bypass graft: Secondary | ICD-10-CM | POA: Diagnosis not present

## 2017-08-21 DIAGNOSIS — I2582 Chronic total occlusion of coronary artery: Secondary | ICD-10-CM | POA: Insufficient documentation

## 2017-08-21 DIAGNOSIS — R9439 Abnormal result of other cardiovascular function study: Secondary | ICD-10-CM

## 2017-08-21 HISTORY — PX: CORONARY STENT INTERVENTION: CATH118234

## 2017-08-21 HISTORY — PX: LEFT HEART CATH AND CORS/GRAFTS ANGIOGRAPHY: CATH118250

## 2017-08-21 LAB — POCT ACTIVATED CLOTTING TIME
Activated Clotting Time: 268 seconds
Activated Clotting Time: 301 seconds

## 2017-08-21 SURGERY — LEFT HEART CATH AND CORS/GRAFTS ANGIOGRAPHY
Anesthesia: LOCAL

## 2017-08-21 MED ORDER — SODIUM CHLORIDE 0.9% FLUSH: 3.0000 mL | Freq: Two times a day (BID) | INTRAVENOUS | Status: DC

## 2017-08-21 MED ORDER — SODIUM CHLORIDE 0.9 % IV SOLN: 250.0000 mL | INTRAVENOUS | Status: DC | PRN

## 2017-08-21 MED ORDER — CLOPIDOGREL BISULFATE 300 MG PO TABS
ORAL_TABLET | ORAL | Status: AC
  Filled 2017-08-21: qty 1

## 2017-08-21 MED ORDER — LIDOCAINE HCL (PF) 1 % IJ SOLN
INTRAMUSCULAR | Status: DC | PRN
  Administered 2017-08-21: 2 mL

## 2017-08-21 MED ORDER — LIDOCAINE HCL (PF) 1 % IJ SOLN
INTRAMUSCULAR | Status: AC
  Filled 2017-08-21: qty 30

## 2017-08-21 MED ORDER — ONDANSETRON HCL 4 MG/2ML IJ SOLN: 4.0000 mg | Freq: Four times a day (QID) | INTRAMUSCULAR | Status: DC | PRN

## 2017-08-21 MED ORDER — LABETALOL HCL 5 MG/ML IV SOLN: 10.0000 mg | INTRAVENOUS | Status: DC | PRN

## 2017-08-21 MED ORDER — FENTANYL CITRATE (PF) 100 MCG/2ML IJ SOLN
INTRAMUSCULAR | Status: DC | PRN
  Administered 2017-08-21: 25 ug via INTRAVENOUS

## 2017-08-21 MED ORDER — NITROGLYCERIN 1 MG/10 ML FOR IR/CATH LAB
INTRA_ARTERIAL | Status: AC
Start: 2017-08-21 — End: ?
  Filled 2017-08-21: qty 10

## 2017-08-21 MED ORDER — HEPARIN (PORCINE) IN NACL 1000-0.9 UT/500ML-% IV SOLN
INTRAVENOUS | Status: AC
  Filled 2017-08-21: qty 1000

## 2017-08-21 MED ORDER — CLOPIDOGREL BISULFATE 75 MG PO TABS: 75.0000 mg | ORAL_TABLET | Freq: Every day | ORAL | Status: DC

## 2017-08-21 MED ORDER — IOHEXOL 350 MG/ML SOLN
INTRAVENOUS | Status: DC | PRN
  Administered 2017-08-21: 125 mL

## 2017-08-21 MED ORDER — CLOPIDOGREL BISULFATE 300 MG PO TABS
ORAL_TABLET | ORAL | Status: DC | PRN
  Administered 2017-08-21: 600 mg via ORAL

## 2017-08-21 MED ORDER — NITROGLYCERIN 1 MG/10 ML FOR IR/CATH LAB
INTRA_ARTERIAL | Status: DC | PRN
  Administered 2017-08-21: 150 ug via INTRACORONARY

## 2017-08-21 MED ORDER — HEPARIN SODIUM (PORCINE) 1000 UNIT/ML IJ SOLN
INTRAMUSCULAR | Status: AC
  Filled 2017-08-21: qty 1

## 2017-08-21 MED ORDER — VERAPAMIL HCL 2.5 MG/ML IV SOLN
INTRAVENOUS | Status: AC
  Filled 2017-08-21: qty 2

## 2017-08-21 MED ORDER — SODIUM CHLORIDE 0.9 % WEIGHT BASED INFUSION: 1.0000 mL/kg/h | INTRAVENOUS | Status: DC

## 2017-08-21 MED ORDER — OXYCODONE HCL 5 MG PO TABS: 5.0000 mg | ORAL_TABLET | ORAL | Status: DC | PRN

## 2017-08-21 MED ORDER — HEPARIN SODIUM (PORCINE) 1000 UNIT/ML IJ SOLN
INTRAMUSCULAR | Status: AC
Start: 2017-08-21 — End: ?
  Filled 2017-08-21: qty 1

## 2017-08-21 MED ORDER — SODIUM CHLORIDE 0.9% FLUSH: 3.0000 mL | INTRAVENOUS | Status: DC | PRN

## 2017-08-21 MED ORDER — SODIUM CHLORIDE 0.9 % IV SOLN
INTRAVENOUS | Status: DC
  Administered 2017-08-21: 06:00:00 via INTRAVENOUS

## 2017-08-21 MED ORDER — APIXABAN 5 MG PO TABS: 5.0000 mg | ORAL_TABLET | Freq: Two times a day (BID) | ORAL | 0 refills | Status: DC

## 2017-08-21 MED ORDER — CLOPIDOGREL BISULFATE 300 MG PO TABS
ORAL_TABLET | ORAL | Status: AC
Start: 2017-08-21 — End: ?
  Filled 2017-08-21: qty 1

## 2017-08-21 MED ORDER — HEPARIN SODIUM (PORCINE) 1000 UNIT/ML IJ SOLN
INTRAMUSCULAR | Status: DC | PRN
  Administered 2017-08-21: 5000 [IU] via INTRAVENOUS
  Administered 2017-08-21: 3000 [IU] via INTRAVENOUS
  Administered 2017-08-21: 5000 [IU] via INTRAVENOUS

## 2017-08-21 MED ORDER — ASPIRIN 81 MG PO CHEW
81.0000 mg | CHEWABLE_TABLET | ORAL | Status: AC
  Administered 2017-08-21: 81 mg via ORAL

## 2017-08-21 MED ORDER — ASPIRIN 81 MG PO CHEW
CHEWABLE_TABLET | ORAL | Status: AC
  Administered 2017-08-21: 81 mg via ORAL
  Filled 2017-08-21: qty 1

## 2017-08-21 MED ORDER — HYDRALAZINE HCL 20 MG/ML IJ SOLN: 5.0000 mg | INTRAMUSCULAR | Status: DC | PRN

## 2017-08-21 MED ORDER — ACETAMINOPHEN 325 MG PO TABS: 650.0000 mg | ORAL_TABLET | ORAL | Status: DC | PRN

## 2017-08-21 MED ORDER — FENTANYL CITRATE (PF) 100 MCG/2ML IJ SOLN
INTRAMUSCULAR | Status: AC
  Filled 2017-08-21: qty 2

## 2017-08-21 MED ORDER — MIDAZOLAM HCL 2 MG/2ML IJ SOLN
INTRAMUSCULAR | Status: AC
  Filled 2017-08-21: qty 2

## 2017-08-21 MED ORDER — VERAPAMIL HCL 2.5 MG/ML IV SOLN
INTRAVENOUS | Status: DC | PRN
  Administered 2017-08-21: 10 mL via INTRA_ARTERIAL

## 2017-08-21 MED ORDER — MIDAZOLAM HCL 2 MG/2ML IJ SOLN
INTRAMUSCULAR | Status: DC | PRN
  Administered 2017-08-21: 1 mg via INTRAVENOUS

## 2017-08-21 MED ORDER — CLOPIDOGREL BISULFATE 75 MG PO TABS: 75.0000 mg | ORAL_TABLET | Freq: Every day | ORAL | 0 refills | Status: DC

## 2017-08-21 MED FILL — CLOPIDOGREL 75 MG TABLET: 75 | 30 days supply | Qty: 30 | Fill #0

## 2017-08-21 SURGICAL SUPPLY — 19 items
BALLN SAPPHIRE 2.5X15 (BALLOONS) ×2
BALLN SAPPHIRE ~~LOC~~ 3.75X15 (BALLOONS) ×2 IMPLANT
BALLOON SAPPHIRE 2.5X15 (BALLOONS) ×1 IMPLANT
CATH INFINITI 5 FR AL2 (CATHETERS) ×2 IMPLANT
CATH INFINITI 5FR AL1 (CATHETERS) ×2 IMPLANT
CATH INFINITI 5FR MULTPACK ANG (CATHETERS) ×2 IMPLANT
CATH LAUNCHER 6FR JR4 (CATHETERS) ×2 IMPLANT
DEVICE RAD COMP TR BAND LRG (VASCULAR PRODUCTS) ×2 IMPLANT
GUIDEWIRE INQWIRE 1.5J.035X260 (WIRE) ×1 IMPLANT
INQWIRE 1.5J .035X260CM (WIRE) ×2
KIT ENCORE 26 ADVANTAGE (KITS) ×2 IMPLANT
KIT HEART LEFT (KITS) ×2 IMPLANT
NEEDLE PERC 21GX4CM (NEEDLE) ×2 IMPLANT
PACK CARDIAC CATHETERIZATION (CUSTOM PROCEDURE TRAY) ×2 IMPLANT
SHEATH RAIN RADIAL 21G 6FR (SHEATH) ×2 IMPLANT
STENT RESOLUTE ONYX 3.5X18 (Permanent Stent) ×2 IMPLANT
TRANSDUCER W/STOPCOCK (MISCELLANEOUS) ×2 IMPLANT
TUBING CIL FLEX 10 FLL-RA (TUBING) ×2 IMPLANT
WIRE COUGAR XT STRL 190CM (WIRE) ×2 IMPLANT

## 2017-08-21 NOTE — Discharge Summary (Addendum)
Discharge Summary    Patient ID: [name],  MRN: [id number], DOB/AGE: January 05, 1946 72 y.o.  Admit date: 08/21/2017 Discharge date: 08/21/2017  Primary Care Provider: Rosita Fire Primary Cardiologist: Kate Sable, MD  Discharge Diagnoses    Principal Problem:   Arteriosclerotic cardiovascular disease (ASCVD) Active Problems:   Hyperlipidemia   Essential hypertension   CKD (chronic kidney disease), stage III (HCC)   OSA (obstructive sleep apnea)   Abnormal nuclear stress test   Chronic systolic heart failure (HCC)   Allergies Allergies  Allergen Reactions  . Lisinopril Swelling and Other (See Comments)    Mouth and tongue swells    Diagnostic Studies/Procedures    Left heart cath 08/21/17: 1. Severe 3 vessel native CAD with severe RCA stenosis, total LAD and LCx occlusion 2. S/P CABG with continued patency of the LIMA-LAD, SVG-diagonal, and sequential SVG-OM1 and OM2 3. Known moderate-severe LV dysfunction 4. Successful PCI of the native RCA with a 3.5x18 mm Resolute Onyx DES  Recommend: Recommend to resume Apixaban, at currently prescribed dose and frequency, tomorrow, 08/22/17. Recommend antiplatelet therapy of Clopidogrel 75mg  QD for 6 months.   Would avoid aspirin to reduce excessive bleeding risk associated with triple therapy in this patient who does not have ACS at presentation  Pt eleigible for same day discharge if no early complications arise   History of Present Illness     [name] was seen in clinic for follow up with Dr. Bronson Ing. He had an echo with reduced EF of 30-35%. he was maintained on beta blocker statin. He has an ACEI allergy. Given this reduced EF, he underwent a nuclear stress test on 05/26/17 that was intermediate. It showed a myocardial scar in the apical anteroseptal region and a partially reversible defect in the inferolateral region extending from the apex to base which appeared consistent with scar and at least a  moderate degree of peri-infarct ischemia.  He denied chest pain and had stable chronic exertional dyspnea. He denied orthopnea, leg swelling, palpitations,and paroxysmal nocturnal dyspnea.   Given his abnormal stress test, Dr. Bronson Ing ordered a limited echocardiogram to evaluate any recovery in function. Echo with mild improvement in EF, but still reduced at 35-40%. Therefore, an outpatient left heart cath was planned.   Hospital Course     Consultants: none  [name] underwent left heart cath on 08/21/17. He was found to have severe 3-v disease with severe RCA stenosis and total LAD and LCx occlusion; patent LIMA-LAD, SVG-diagonal, an sequential SVG-OM1-OM2. DES to native RCA successfully placed.   The patient tolerated the procedure well, has ambulated and was seen by cardiac rehab. Right radial site C/D/I without hematoma.   Will plan to resume eliquis tomorrow and continue plavix for 6 months. Because he did not present with ACS, will avoid ASA.   He is ready for same-day discharge. Pt stable at 1500, cath site without hematoma. Pt has no questions.    _____________  Discharge Vitals Blood pressure (!) 166/70, pulse (!) 54, temperature 98.6 F (37 C), temperature source Oral, resp. rate 12, height 5\' 6"  (1.676 m), weight 220 lb (99.8 kg), SpO2 98 %.  Filed Weights   08/21/17 0541  Weight: 220 lb (99.8 kg)    Labs & Radiologic Studies    CBC No results for input(s): WBC, NEUTROABS, HGB, HCT, MCV, PLT in the last 72 hours. Basic Metabolic Panel No results for input(s): NA, K, CL, CO2, GLUCOSE, BUN, CREATININE, CALCIUM, MG, PHOS in the last  72 hours. Liver Function Tests No results for input(s): AST, ALT, ALKPHOS, BILITOT, PROT, ALBUMIN in the last 72 hours. No results for input(s): LIPASE, AMYLASE in the last 72 hours. Cardiac Enzymes No results for input(s): CKTOTAL, CKMB, CKMBINDEX, TROPONINI in the last 72 hours. BNP Invalid input(s): POCBNP D-Dimer No results for  input(s): DDIMER in the last 72 hours. Hemoglobin A1C No results for input(s): HGBA1C in the last 72 hours. Fasting Lipid Panel No results for input(s): CHOL, HDL, LDLCALC, TRIG, CHOLHDL, LDLDIRECT in the last 72 hours. Thyroid Function Tests No results for input(s): TSH, T4TOTAL, T3FREE, THYROIDAB in the last 72 hours.  Invalid input(s): FREET3 _____________  No results found. Disposition   Pt is being discharged home today in good condition.  Follow-up Plans & Appointments    Follow-up Information    Orason, Crista Luria, [place] Follow up on 08/27/2017.   Specialty:  Cardiology Why:  11:30 am for [facility] Contact information: [place] [address] [phone]          Discharge Instructions    Amb Referral to Cardiac Rehabilitation   Complete by:  As directed    Diagnosis:   Coronary Stents PTCA     Diet - low sodium heart healthy   Complete by:  As directed    Discharge instructions   Complete by:  As directed    No driving for 2 days. No lifting over 5 lbs for 1 week. No sexual activity for 1 week. Keep procedure site clean & dry. If you notice increased pain, swelling, bleeding or pus, call/return!  You may shower, but no soaking baths/hot tubs/pools for 1 week.   Increase activity slowly   Complete by:  As directed       Discharge Medications   Allergies as of 08/21/2017      Reactions   Lisinopril Swelling, Other (See Comments)   Mouth and tongue swells      Medication List    TAKE these medications   apixaban 5 MG Tabs tablet Commonly known as:  ELIQUIS Take 1 tablet (5 mg total) by mouth 2 (two) times daily. Restart 08/22/17. What changed:  additional instructions   atorvastatin 40 MG tablet Commonly known as:  LIPITOR Take 1 tablet (40 mg total) by mouth daily at 6 PM.   clopidogrel 75 MG tablet Commonly known as:  PLAVIX Take 1 tablet (75 mg total) by mouth daily.   cycloSPORINE 0.05 % ophthalmic emulsion Commonly known  as:  RESTASIS Place 1 drop into both eyes daily as needed (dry eye).   fluticasone 50 MCG/ACT nasal spray Commonly known as:  FLONASE Place 2 sprays into both nostrils 2 (two) times daily as needed for allergies or rhinitis.   furosemide 20 MG tablet Commonly known as:  LASIX Take 1 tablet (20 mg total) by mouth daily.   isosorbide-hydrALAZINE 20-37.5 MG tablet Commonly known as:  BIDIL Take 1 tablet by mouth 3 (three) times daily.   metoprolol succinate 50 MG 24 hr tablet Commonly known as:  TOPROL-XL Take 50 mg by mouth daily.   nitroGLYCERIN 0.4 MG SL tablet Commonly known as:  NITROSTAT Place 1 tablet (0.4 mg total) under the tongue every 5 (five) minutes as needed for chest pain. up to 3 doses.         Outstanding Labs/Studies   TCM  Consider repeat echo in three months  Duration of Discharge Encounter   Greater than 30 minutes including physician time.  Signed,  Huel Coventry, NP 08/21/2017, 3:02 PM

## 2017-08-21 NOTE — Interval H&P Note (Signed)
History and Physical Interval Note:  08/21/2017 7:41 AM  [name]  has presented today for surgery, with the diagnosis of abn stress  The various methods of treatment have been discussed with the patient and family. After consideration of risks, benefits and other options for treatment, the patient has consented to  Procedure(s): LEFT HEART CATH AND CORS/GRAFTS ANGIOGRAPHY (N/A) as a surgical intervention .  The patient's history has been reviewed, patient examined, no change in status, stable for surgery.  I have reviewed the patient's chart and labs.  Questions were answered to the patient's satisfaction.     [name]

## 2017-08-21 NOTE — Progress Notes (Signed)
Ed completed with pt and daughters. Good reception. Understands importance of Plavix. Will refer to [place] CES, ACSM 1:25 PM 08/21/2017

## 2017-08-21 NOTE — Progress Notes (Signed)
3cc air removed from radial band.  Bleeding noticed, 3cc air reinserted site stable will continue to monitor

## 2017-08-21 NOTE — Discharge Instructions (Signed)

## 2017-08-24 ENCOUNTER — Encounter (HOSPITAL_COMMUNITY): Payer: Self-pay | Admitting: Cardiovascular Disease

## 2017-08-24 MED FILL — Heparin Sod (Porcine)-NaCl IV Soln 1000 Unit/500ML-0.9%: INTRAVENOUS | Qty: 1000 | Status: AC

## 2017-08-27 ENCOUNTER — Encounter (INDEPENDENT_AMBULATORY_CARE_PROVIDER_SITE_OTHER): Payer: Self-pay

## 2017-08-27 ENCOUNTER — Ambulatory Visit (INDEPENDENT_AMBULATORY_CARE_PROVIDER_SITE_OTHER): Payer: Medicare HMO | Admitting: Physician Assistant

## 2017-08-27 ENCOUNTER — Encounter: Payer: Self-pay | Admitting: Physician Assistant

## 2017-08-27 VITALS — BP 142/80 | HR 60 | Ht 66.0 in | Wt 220.8 lb

## 2017-08-27 DIAGNOSIS — Z8673 Personal history of transient ischemic attack (TIA), and cerebral infarction without residual deficits: Secondary | ICD-10-CM | POA: Diagnosis not present

## 2017-08-27 DIAGNOSIS — I10 Essential (primary) hypertension: Secondary | ICD-10-CM

## 2017-08-27 DIAGNOSIS — E782 Mixed hyperlipidemia: Secondary | ICD-10-CM | POA: Diagnosis not present

## 2017-08-27 DIAGNOSIS — I513 Intracardiac thrombosis, not elsewhere classified: Secondary | ICD-10-CM | POA: Diagnosis not present

## 2017-08-27 DIAGNOSIS — I25768 Atherosclerosis of bypass graft of coronary artery of transplanted heart with other forms of angina pectoris: Secondary | ICD-10-CM | POA: Diagnosis not present

## 2017-08-27 DIAGNOSIS — I5022 Chronic systolic (congestive) heart failure: Secondary | ICD-10-CM

## 2017-08-27 DIAGNOSIS — I2510 Atherosclerotic heart disease of native coronary artery without angina pectoris: Secondary | ICD-10-CM | POA: Diagnosis not present

## 2017-08-27 NOTE — Progress Notes (Signed)
Cardiology Office Note    Date:  08/27/2017   ID:  [name], DOB 17-Aug-1945, MRN [id number]  PCP:  Rosita Fire, MD  Cardiologist:  Kate Sable, MD  Chief Complaint: cath follow up  History of Present Illness:   [name] is a 72 y.o. male with hx of CAD s/p CABG in 2011, HLD, HTN, CVA, OSA, chronic systolic CHF and apical thrombus presents for follow up.   History of anterior MI in 2004, PCI of the LAD and RCA, history of coronary artery bypass grafting in 2011,, LIMA to LAD, SVG to diagonal 1, OM1 and OM 2.   December 2018 showed LV function of 30 to 94%, grade 1 diastolic dysfunction and akinesis.  Apical thrombus was noted as well.  He was  on Eliquis for cerebral thrombosis with CVA .  He was treated with beta-blocker and statin. He has an ACEI allergy. Given this reduced EF, he underwent a nuclear stress test on 05/26/17 that was intermediate. It showed a myocardial scar in the apical anteroseptal region and a partially reversible defect in the inferolateral region extending from the apex to base which appeared consistent with scar and at least a moderate degree of peri-infarct ischemia.  He denied chest pain and had stable chronic exertional dyspnea. He denied orthopnea, leg swelling, palpitations,and paroxysmal nocturnal dyspnea.   Given his abnormal stress test, Dr. Bronson Ing ordered a limited echocardiogram to evaluate any recovery in function. Echo 03/08/2017 with mild improvement in EF, but still reduced at 35-40%. Therefore, an outpatient left heart cath was planned.   He underwent left heart cath on 08/21/17. He was found to have severe 3-v disease with severe RCA stenosis and total LAD and LCx occlusion; patent LIMA-LAD, SVG-diagonal, an sequential SVG-OM1-OM2. DES to native RCA successfully placed.  Here today for follow up. Walks about 1 mile every day without chest pain or shortness of breath. He takes Bidil two times day, occasionally TID. Otherwise  compliant with other medications. Does not checks his BP. No bleeding issue.   Past Medical History:  Diagnosis Date  . Apical mural thrombus   . Arteriosclerotic cardiovascular disease (ASCVD) 1998   Inferior MI->PCI of the RCA in 1998; acute anterior MI in 06/2002->  PCI of RCA and LAD with DES x2 to RCA, residual 80% ostial D1, and 80% mid CX and EF-40%; CABG-2011, LIMA-LAD, SVG to D1, OM1 & OM2; EF of 35-40% in 07/2010  . Chronic anticoagulation   . Chronic systolic CHF (congestive heart failure) ([place])   . CVA (cerebral vascular accident) ([place])   . HTN (hypertension)   . Hyperlipidemia   . Keloid    median sternotomy  . Pulmonary embolism ([place]) 03/2010  . Substance abuse [facility])    formerly cocaine    Past Surgical History:  Procedure Laterality Date  . COLONOSCOPY N/A 01/22/2017   Procedure: COLONOSCOPY;  Surgeon: Danie Binder, MD;  [facility];  Service: Endoscopy;  Laterality: N/A;  200  . CORONARY ARTERY BYPASS GRAFT  03/18/2010   LIMA-LAD, SVG to diagonal, OM1 & OM2  . CORONARY STENT INTERVENTION N/A 08/21/2017   Procedure: CORONARY STENT INTERVENTION;  Surgeon: [name], MD;  Location: [place] [facility];  Service: Cardiovascular;  Laterality: N/A;  . LEFT HEART CATH AND CORS/GRAFTS ANGIOGRAPHY N/A 08/21/2017   Procedure: LEFT HEART CATH AND CORS/GRAFTS ANGIOGRAPHY;  Surgeon: [name], MD;  Location: [place] [facility];  Service: Cardiovascular;  Laterality: N/A;  . POLYPECTOMY  01/22/2017   Procedure: POLYPECTOMY;  Surgeon: Danie Binder, MD;  Location: [facility];  Service: Endoscopy;;  Transverse(CS) and sigmoid colon(HS)  . PTCA  06/1996   LAD & RCA  . TEE WITHOUT CARDIOVERSION N/A 06/10/2012   Procedure: TRANSESOPHAGEAL ECHOCARDIOGRAM (TEE);  Surgeon: Josue Hector, MD;  Location: [facility];  Service: Cardiovascular;  Laterality: N/A;    Current Medications: Prior to Admission medications   Medication Sig Start Date End Date Taking?  Authorizing Provider  apixaban (ELIQUIS) 5 MG TABS tablet Take 1 tablet (5 mg total) by mouth 2 (two) times daily. Restart 08/22/17. 08/21/17   Isaiah Serge, NP  atorvastatin (LIPITOR) 40 MG tablet Take 1 tablet (40 mg total) by mouth daily at 6 PM. 03/09/17   Debbe Odea, MD  clopidogrel (PLAVIX) 75 MG tablet Take 1 tablet (75 mg total) by mouth daily. 08/21/17   [name], MD  cycloSPORINE (RESTASIS) 0.05 % ophthalmic emulsion Place 1 drop into both eyes daily as needed (dry eye).     [provider]  fluticasone (FLONASE) 50 MCG/ACT nasal spray Place 2 sprays into both nostrils 2 (two) times daily as needed for allergies or rhinitis.    [provider]  furosemide (LASIX) 20 MG tablet Take 1 tablet (20 mg total) by mouth daily. 03/22/17   Johnson, Clanford L, MD  isosorbide-hydrALAZINE (BIDIL) 20-37.5 MG tablet Take 1 tablet by mouth 3 (three) times daily. 05/18/17   Herminio Commons, MD  metoprolol succinate (TOPROL-XL) 50 MG 24 hr tablet Take 50 mg by mouth daily. 04/27/17   [provider]  nitroGLYCERIN (NITROSTAT) 0.4 MG SL tablet Place 1 tablet (0.4 mg total) under the tongue every 5 (five) minutes as needed for chest pain. up to 3 doses. 07/05/13   Lendon Colonel, NP    Allergies:   Lisinopril   Social History   Socioeconomic History  . Marital status: Widowed    Spouse name: Not on file  . Number of children: Not on file  . Years of education: Not on file  . Highest education level: Not on file  Occupational History  . Occupation: disability  Social Needs  . Financial resource strain: Not on file  . Food insecurity:    Worry: Not on file    Inability: Not on file  . Transportation needs:    Medical: Not on file    Non-medical: Not on file  Tobacco Use  . Smoking status: Former Smoker    Last attempt to quit: 2004    Years since quitting: 15.4  . Smokeless tobacco: Never Used  Substance and Sexual Activity  . Alcohol use: Yes     Comment: rare  . Drug use: No    Types: Cocaine    Comment: Former cocaine abuse - last use for 51th birthday  . Sexual activity: Not on file  Lifestyle  . Physical activity:    Days per week: Not on file    Minutes per session: Not on file  . Stress: Not on file  Relationships  . Social connections:    Talks on phone: Not on file    Gets together: Not on file    Attends religious service: Not on file    Active member of club or organization: Not on file    Attends meetings of clubs or organizations: Not on file    Relationship status: Not on file  Other Topics Concern  . Not on file  Social  History Narrative  . Not on file     Family History:  The patient's family history includes Cancer (age of onset: 75) in his mother.   ROS:   Please see the history of present illness.    ROS All other systems reviewed and are negative.   PHYSICAL EXAM:   VS:  BP (!) 142/80   Pulse 60   Ht 5\' 6"  (1.676 m)   Wt 220 lb 12.8 oz (100.2 kg)   SpO2 97%   BMI 35.64 kg/m    GEN: Well nourished, well developed, in no acute distress  HEENT: normal  Neck: no JVD, carotid bruits, or masses Cardiac: RRR; no murmurs, rubs, or gallops,no edema  Respiratory:  clear to auscultation bilaterally, normal work of breathing GI: soft, nontender, nondistended, + BS MS: no deformity or atrophy  Skin: warm and dry, no rash Neuro:  Alert and Oriented x 3, Strength and sensation are intact Psych: euthymic mood, full affect  Wt Readings from Last 3 Encounters:  08/27/17 220 lb 12.8 oz (100.2 kg)  08/21/17 220 lb (99.8 kg)  08/03/17 219 lb (99.3 kg)      Studies/Labs Reviewed:   EKG:  EKG is not ordered today.    Recent Labs: 03/07/2017: TSH 1.370 03/20/2017: ALT 39; B Natriuretic Peptide 257.0 08/18/2017: BUN 11; Creatinine, Ser 1.43; Hemoglobin 15.3; Platelets 142; Potassium 3.6; Sodium 138   Lipid Panel    Component Value Date/Time   CHOL 116 03/09/2017 0458   TRIG 76 03/09/2017 0458    HDL 43 03/09/2017 0458   CHOLHDL 2.7 03/09/2017 0458   VLDL 15 03/09/2017 0458   LDLCALC 58 03/09/2017 0458    Additional studies/ records that were reviewed today include:   Echocardiogram: 08/11/2017 Study Conclusions  - Left ventricle: The cavity size was normal. Wall thickness was   increased in a pattern of mild LVH. Systolic function was   moderately reduced. The estimated ejection fraction was in the   range of 35% to 40%. - Regional wall motion abnormality: Hypokinesis of the mid-apical   anterior, mid anteroseptal, and apical myocardium. Near   resolution of prior apical thrombus, small remnant remains. - Technically difficult study. Echocontrast was used to enhance   visualization. - Limited echo to evaluate LV function and LV thrombus.  CORONARY STENT INTERVENTION   08/21/17  LEFT HEART CATH AND CORS/GRAFTS ANGIOGRAPHY  Conclusion   1. Severe 3 vessel native CAD with severe RCA stenosis, total LAD and LCx occlusion 2. S/P CABG with continued patency of the LIMA-LAD, SVG-diagonal, and sequential SVG-OM1 and OM2 3. Known moderate-severe LV dysfunction 4. Successful PCI of the native RCA with a 3.5x18 mm Resolute Onyx DES  Recommend: Recommend to resume Apixaban, at currently prescribed dose and frequency, tomorrow, 08/22/17. Recommend antiplatelet therapy of Clopidogrel 75mg  QD for 6 months.   Would avoid aspirin to reduce excessive bleeding risk associated with triple therapy in this patient who does not have ACS at presentation  Pt eleigible for same day discharge if no early complications arise     ASSESSMENT & PLAN:    1. CAD s/p CABG - Recent stent shoed patent grafts. S/p DES to native RCA. No angina. - Continue Plavix, statin and BB. Not on ASA due to need to anticoagulation.   2. Chronic systolic CHF - Euvolemic. Last echo 08/2017 showed LVEF of 35-40%. Continue coreg and Bidil. Not on ACE/ARB due to allergy.   3.Apical thrombus - Last echo showed  near resolution of  thrombus. On Eliquis.  4. Hx of cerebral thrombus with CVA x 2 in 03/2017 - On Eliquis.   5. HTN - Minimally elevated today and also on prior visit. Encouraged to take Bidil TID. Continue Toprol. Advised to keep log of BP and bring during next office visit.   6. HLD - 03/09/2017: Cholesterol 116; HDL 43; LDL Cholesterol 58; Triglycerides 76; VLDL 15  - Continue statin.     Medication Adjustments/Labs and Tests Ordered: Current medicines are reviewed at length with the patient today.  Concerns regarding medicines are outlined above.  Medication changes, Labs and Tests ordered today are listed in the Patient Instructions below. Patient Instructions  Medication Instructions:  Your physician recommends that you continue on your current medications as directed. Please refer to the Current Medication list given to you today.   Labwork: None ordered  Testing/Procedures: None ordered  Follow-Up: Your physician recommends that you schedule a follow-up appointment in: [place]   Any Other Special Instructions Will Be Listed Below (If Applicable).     If you need a refill on your cardiac medications before your next appointment, please call your pharmacy.      Jarrett Soho, [place]  08/27/2017 12:04 PM    [facility] [address], [place], [place]  [address] Phone: [phone]; Fax: [phone]

## 2017-08-27 NOTE — Patient Instructions (Addendum)
Medication Instructions:  Your physician recommends that you continue on your current medications as directed. Please refer to the Current Medication list given to you today.   Labwork: None ordered  Testing/Procedures: None ordered  Follow-Up: Your physician recommends that you schedule a follow-up appointment in: [place]   Any Other Special Instructions Will Be Listed Below (If Applicable).     If you need a refill on your cardiac medications before your next appointment, please call your pharmacy.

## 2017-09-22 ENCOUNTER — Encounter: Payer: Self-pay | Admitting: Cardiovascular Disease

## 2017-09-23 ENCOUNTER — Emergency Department (HOSPITAL_COMMUNITY): Payer: Medicare HMO

## 2017-09-23 ENCOUNTER — Encounter (HOSPITAL_COMMUNITY): Payer: Self-pay | Admitting: Emergency Medicine

## 2017-09-23 ENCOUNTER — Observation Stay (HOSPITAL_COMMUNITY)
Admission: EM | Admit: 2017-09-23 | Discharge: 2017-09-24 | Disposition: A | Discharge status: Home / Self Care | Payer: Medicare HMO | Attending: Internal Medicine | Admitting: Internal Medicine

## 2017-09-23 ENCOUNTER — Other Ambulatory Visit: Payer: Self-pay

## 2017-09-23 DIAGNOSIS — Z79899 Other long term (current) drug therapy: Secondary | ICD-10-CM | POA: Diagnosis not present

## 2017-09-23 DIAGNOSIS — I639 Cerebral infarction, unspecified: Secondary | ICD-10-CM | POA: Diagnosis present

## 2017-09-23 DIAGNOSIS — I509 Heart failure, unspecified: Secondary | ICD-10-CM | POA: Diagnosis not present

## 2017-09-23 DIAGNOSIS — I130 Hypertensive heart and chronic kidney disease with heart failure and stage 1 through stage 4 chronic kidney disease, or unspecified chronic kidney disease: Secondary | ICD-10-CM | POA: Diagnosis not present

## 2017-09-23 DIAGNOSIS — I110 Hypertensive heart disease with heart failure: Secondary | ICD-10-CM | POA: Diagnosis not present

## 2017-09-23 DIAGNOSIS — N183 Chronic kidney disease, stage 3 unspecified: Secondary | ICD-10-CM | POA: Diagnosis present

## 2017-09-23 DIAGNOSIS — I5023 Acute on chronic systolic (congestive) heart failure: Secondary | ICD-10-CM | POA: Diagnosis not present

## 2017-09-23 DIAGNOSIS — Z87891 Personal history of nicotine dependence: Secondary | ICD-10-CM | POA: Insufficient documentation

## 2017-09-23 DIAGNOSIS — Z7901 Long term (current) use of anticoagulants: Secondary | ICD-10-CM | POA: Insufficient documentation

## 2017-09-23 DIAGNOSIS — R079 Chest pain, unspecified: Secondary | ICD-10-CM | POA: Diagnosis not present

## 2017-09-23 DIAGNOSIS — Z951 Presence of aortocoronary bypass graft: Secondary | ICD-10-CM | POA: Diagnosis not present

## 2017-09-23 DIAGNOSIS — N1831 Chronic kidney disease, stage 3a: Secondary | ICD-10-CM | POA: Diagnosis present

## 2017-09-23 DIAGNOSIS — Z8673 Personal history of transient ischemic attack (TIA), and cerebral infarction without residual deficits: Secondary | ICD-10-CM | POA: Insufficient documentation

## 2017-09-23 DIAGNOSIS — R0602 Shortness of breath: Secondary | ICD-10-CM | POA: Diagnosis present

## 2017-09-23 DIAGNOSIS — I10 Essential (primary) hypertension: Secondary | ICD-10-CM | POA: Diagnosis present

## 2017-09-23 DIAGNOSIS — G4733 Obstructive sleep apnea (adult) (pediatric): Secondary | ICD-10-CM | POA: Diagnosis present

## 2017-09-23 LAB — CBC
HCT: 46.1 % (ref 39.0–52.0)
Hemoglobin: 15 g/dL (ref 13.0–17.0)
MCH: 29.1 pg (ref 26.0–34.0)
MCHC: 32.5 g/dL (ref 30.0–36.0)
MCV: 89.3 fL (ref 78.0–100.0)
Platelets: 143 10*3/uL — ABNORMAL LOW (ref 150–400)
RBC: 5.16 MIL/uL (ref 4.22–5.81)
RDW: 13.5 % (ref 11.5–15.5)
WBC: 6.9 10*3/uL (ref 4.0–10.5)

## 2017-09-23 LAB — BASIC METABOLIC PANEL
Anion gap: 8 (ref 5–15)
BUN: 14 mg/dL (ref 6–20)
CO2: 25 mmol/L (ref 22–32)
Calcium: 8.6 mg/dL — ABNORMAL LOW (ref 8.9–10.3)
Chloride: 104 mmol/L (ref 101–111)
Creatinine, Ser: 1.34 mg/dL — ABNORMAL HIGH (ref 0.61–1.24)
GFR calc Af Amer: 60 mL/min — ABNORMAL LOW (ref >60)
GFR calc non Af Amer: 52 mL/min — ABNORMAL LOW (ref >60)
Glucose, Bld: 117 mg/dL — ABNORMAL HIGH (ref 65–99)
Potassium: 3.1 mmol/L — ABNORMAL LOW (ref 3.5–5.1)
Sodium: 137 mmol/L (ref 135–145)

## 2017-09-23 LAB — BRAIN NATRIURETIC PEPTIDE: B Natriuretic Peptide: 79 pg/mL (ref 0.0–100.0)

## 2017-09-23 LAB — TROPONIN I: Troponin I: 0.05 ng/mL (ref <0.03)

## 2017-09-23 LAB — CBG MONITORING, ED: Glucose-Capillary: 136 mg/dL — ABNORMAL HIGH (ref 65–99)

## 2017-09-23 MED ORDER — FUROSEMIDE 10 MG/ML IJ SOLN
40.0000 mg | Freq: Once | INTRAMUSCULAR | Status: AC
  Administered 2017-09-23: 40 mg via INTRAVENOUS
  Filled 2017-09-23: qty 4

## 2017-09-23 NOTE — ED Triage Notes (Signed)
Pt C/O chest pain that started around 1800 this afternoon. Pt states his chest began to hurt after moving things to a storage building. Pt states he had stents placed in his heart around 2 months ago.

## 2017-09-23 NOTE — ED Notes (Signed)
Date and time results received: 09/23/17 9:28 PM   Test: troponin Critical Value: 0.05  Name of Provider Notified: Dr Eulis Foster  Orders Received? Or Actions Taken?: no new orders at this time.

## 2017-09-23 NOTE — ED Provider Notes (Signed)
[facility] EMERGENCY DEPARTMENT Provider Note   CSN: [id number] Arrival date & time: 09/23/17  1950     History   Chief Complaint Chief Complaint  Patient presents with  . Chest Pain    HPI [name] is a 72 y.o. male.  HPI Patient is here for evaluation of a tired feeling, with some shortness of breath, which occurred when he was loading some stuff into a storage shed.  He was running back and forth between his house and the shed, moving furniture and clothing.  He denies chest pain, upper back pain, nausea, vomiting, weakness or dizziness.  He has a full feeling in his lower abdomen.  He took his usual medications today.  There are no other known modifying factors.   Past Medical History:  Diagnosis Date  . Apical mural thrombus   . Arteriosclerotic cardiovascular disease (ASCVD) 1998   Inferior MI->PCI of the RCA in 1998; acute anterior MI in 06/2002->  PCI of RCA and LAD with DES x2 to RCA, residual 80% ostial D1, and 80% mid CX and EF-40%; CABG-2011, LIMA-LAD, SVG to D1, OM1 & OM2; EF of 35-40% in 07/2010  . Chronic anticoagulation   . Chronic systolic CHF (congestive heart failure) ([place])   . CVA (cerebral vascular accident) ([place])   . HTN (hypertension)   . Hyperlipidemia   . Keloid    median sternotomy  . Pulmonary embolism ([place]) 03/2010  . Substance abuse [facility])    formerly cocaine    Patient Active Problem List   Diagnosis Date Noted  . CHF (congestive heart failure) ([place]) 09/24/2017  . Chronic systolic heart failure ([place]) 08/21/2017  . Abnormal nuclear stress test   . CHF exacerbation ([place]) 03/20/2017  . Cerebral thrombosis with cerebral infarction 03/08/2017  . TIA (transient ischemic attack) 03/08/2017  . Visual field defect of right eye 03/07/2017  . CKD (chronic kidney disease), stage III ([place]) 03/07/2017  . Hyperglycemia 03/07/2017  . OSA (obstructive sleep apnea) 03/07/2017  . Special screening for malignant neoplasms, colon   . Central sleep  apnea 11/01/2012  . Chronic anticoagulation-discontinued 10/19/2012  . Cerebral infarction ([place]) 06/09/2012  . Arteriosclerotic cardiovascular disease (ASCVD)   . Pulmonary embolism ([place]) 03/07/2010  . Hyperlipidemia 11/24/2008  . Essential hypertension 11/24/2008    Past Surgical History:  Procedure Laterality Date  . COLONOSCOPY N/A 01/22/2017   Procedure: COLONOSCOPY;  Surgeon: Danie Binder, MD;  Location[facility];  Service: Endoscopy;  Laterality: N/A;  200  . CORONARY ARTERY BYPASS GRAFT  03/18/2010   LIMA-LAD, SVG to diagonal, OM1 & OM2  . CORONARY STENT INTERVENTION N/A 08/21/2017   Procedure: CORONARY STENT INTERVENTION;  Surgeon: [name], MD;  Location: [place] CV LAB;  Service: Cardiovascular;  Laterality: N/A;  . LEFT HEART CATH AND CORS/GRAFTS ANGIOGRAPHY N/A 08/21/2017   Procedure: LEFT HEART CATH AND CORS/GRAFTS ANGIOGRAPHY;  Surgeon: [name], MD;  Location: [place] CV LAB;  Service: Cardiovascular;  Laterality: N/A;  . POLYPECTOMY  01/22/2017   Procedure: POLYPECTOMY;  Surgeon: Danie Binder, MD;  Location: [facility];  Service: Endoscopy;;  Transverse(CS) and sigmoid colon(HS)  . PTCA  06/1996   LAD & RCA  . TEE WITHOUT CARDIOVERSION N/A 06/10/2012   Procedure: TRANSESOPHAGEAL ECHOCARDIOGRAM (TEE);  Surgeon: Josue Hector, MD;  Location: [facility];  Service: Cardiovascular;  Laterality: N/A;        Home Medications    Prior to Admission medications   Medication Sig  Start Date End Date Taking? Authorizing Provider  apixaban (ELIQUIS) 5 MG TABS tablet Take 1 tablet (5 mg total) by mouth 2 (two) times daily. Restart 08/22/17. 08/21/17  Yes Isaiah Serge, NP  atorvastatin (LIPITOR) 40 MG tablet Take 1 tablet (40 mg total) by mouth daily at 6 PM. 03/09/17  Yes Rizwan, Eunice Blase, MD  clotrimazole-betamethasone (LOTRISONE) cream Apply 1 application topically daily as needed (for rah-irritation).   Yes [provider]    cycloSPORINE (RESTASIS) 0.05 % ophthalmic emulsion Place 1 drop into both eyes daily as needed (dry eye).    Yes [provider]  fluticasone (FLONASE) 50 MCG/ACT nasal spray Place 2 sprays into both nostrils 2 (two) times daily as needed for allergies or rhinitis.   Yes [provider]  furosemide (LASIX) 20 MG tablet Take 1 tablet (20 mg total) by mouth daily. 03/22/17  Yes Johnson, Clanford L, MD  isosorbide-hydrALAZINE (BIDIL) 20-37.5 MG tablet Take 1 tablet by mouth 3 (three) times daily. 05/18/17  Yes Herminio Commons, MD  metoprolol succinate (TOPROL-XL) 50 MG 24 hr tablet Take 50 mg by mouth daily. 04/27/17  Yes [provider]  nitroGLYCERIN (NITROSTAT) 0.4 MG SL tablet Place 1 tablet (0.4 mg total) under the tongue every 5 (five) minutes as needed for chest pain. up to 3 doses. 07/05/13  Yes Lendon Colonel, NP  clopidogrel (PLAVIX) 75 MG tablet Take 1 tablet (75 mg total) by mouth daily. Patient not taking: Reported on 09/23/2017 08/21/17   [name], MD    Family History Family History  Problem Relation Age of Onset  . Cancer Mother 63    Social History Social History   Tobacco Use  . Smoking status: Former Smoker    Last attempt to quit: 2004    Years since quitting: 15.4  . Smokeless tobacco: Never Used  Substance Use Topics  . Alcohol use: Yes    Comment: rare  . Drug use: No    Types: Cocaine    Comment: Former cocaine abuse - last use for 51th birthday     Allergies   Lisinopril   Review of Systems Review of Systems  All other systems reviewed and are negative.    Physical Exam Updated Vital Signs BP (!) 131/94 (BP Location: Left Arm)   Pulse 69   Temp 97.8 F (36.6 C) (Oral)   Resp 17   Wt 96.4 kg (212 lb 8.4 oz)   SpO2 98%   BMI 34.30 kg/m   Physical Exam  Constitutional: He is oriented to person, place, and time. He appears well-developed. He does not appear ill.  Elderly, frail  HENT:  Head:  Normocephalic and atraumatic.  Right Ear: External ear normal.  Left Ear: External ear normal.  Eyes: Pupils are equal, round, and reactive to light. Conjunctivae and EOM are normal.  Neck: Normal range of motion and phonation normal. Neck supple.  Cardiovascular: Normal rate, regular rhythm and normal heart sounds.  Pulmonary/Chest: Effort normal and breath sounds normal. He exhibits no bony tenderness.  Abdominal: Soft. There is no tenderness.  Musculoskeletal: Normal range of motion. He exhibits no edema or tenderness.  Neurological: He is alert and oriented to person, place, and time. No cranial nerve deficit or sensory deficit. He exhibits normal muscle tone. Coordination normal.  Skin: Skin is warm, dry and intact.  Psychiatric: He has a normal mood and affect. His behavior is normal. Judgment and thought content normal.  Nursing note and vitals reviewed.  ED Treatments / Results  Labs (all labs ordered are listed, but only abnormal results are displayed) Labs Reviewed  BASIC METABOLIC PANEL - Abnormal; Notable for the following components:      Result Value   Potassium 3.1 (*)    Glucose, Bld 117 (*)    Creatinine, Ser 1.34 (*)    Calcium 8.6 (*)    GFR calc non Af Amer 52 (*)    GFR calc Af Amer 60 (*)    All other components within normal limits  CBC - Abnormal; Notable for the following components:   Platelets 143 (*)    All other components within normal limits  TROPONIN I - Abnormal; Notable for the following components:   Troponin I 0.05 (*)    All other components within normal limits  TROPONIN I - Abnormal; Notable for the following components:   Troponin I 0.05 (*)    All other components within normal limits  TROPONIN I - Abnormal; Notable for the following components:   Troponin I 0.06 (*)    All other components within normal limits  BASIC METABOLIC PANEL - Abnormal; Notable for the following components:   Glucose, Bld 115 (*)    Creatinine, Ser 1.47  (*)    GFR calc non Af Amer 46 (*)    GFR calc Af Amer 54 (*)    All other components within normal limits  CBC - Abnormal; Notable for the following components:   Platelets 143 (*)    All other components within normal limits  CBG MONITORING, ED - Abnormal; Notable for the following components:   Glucose-Capillary 136 (*)    All other components within normal limits  BRAIN NATRIURETIC PEPTIDE  MAGNESIUM  TROPONIN I    EKG EKG Interpretation  Date/Time:  Wednesday September 23 2017 19:58:16 EDT Ventricular Rate:  78 PR Interval:  118 QRS Duration: 98 QT Interval:  404 QTC Calculation: 460 R Axis:   46 Text Interpretation:  Sinus rhythm with Premature atrial complexes Possible Left atrial enlargement RSR' or QR pattern in V1 suggests right ventricular conduction delay Inferior infarct , age undetermined Anterolateral infarct , age undetermined Abnormal ECG since last tracing no significant change Confirmed by Daleen Bo [phone]) on 09/23/2017 9:18:57 PM   Radiology Dg Chest 2 View  Result Date: 09/23/2017 CLINICAL DATA:  Chest pain starting at 1800 hours today. EXAM: CHEST - 2 VIEW COMPARISON:  03/20/2017 FINDINGS: Stable cardiomegaly with aortic atherosclerosis. The patient is status post median sternotomy. Diffuse interstitial edema is identified with slightly more confluent airspace opacities at the left lung base. No effusion or pneumothorax. No acute osseous abnormality. Osteoarthritis of the AC joints bilaterally. IMPRESSION: Cardiomegaly with aortic atherosclerosis. Mild diffuse interstitial edema. Pulmonary opacity at the left lung base may represent superimposed pneumonia or atelectasis. Electronically Signed   By: Ashley Royalty M.D.   On: 09/23/2017 21:27    Procedures .Critical Care Performed by: Daleen Bo, MD Authorized by: Daleen Bo, MD   Critical care provider statement:    Critical care time (minutes):  35   Critical care start time:  09/23/2017 9:20 PM    Critical care end time:  09/23/2017 10:25 PM   Critical care time was exclusive of:  Separately billable procedures and treating other patients   Critical care was necessary to treat or prevent imminent or life-threatening deterioration of the following conditions:  Circulatory failure   Critical care was time spent personally by me on the following activities:  Blood  draw for specimens, development of treatment plan with patient or surrogate, discussions with consultants, evaluation of patient's response to treatment, examination of patient, obtaining history from patient or surrogate, ordering and performing treatments and interventions, ordering and review of laboratory studies, pulse oximetry, re-evaluation of patient's condition, review of old charts and ordering and review of radiographic studies   (including critical care time)  Medications Ordered in ED Medications  clopidogrel (PLAVIX) tablet 75 mg (75 mg Oral Given 09/24/17 0916)  apixaban (ELIQUIS) tablet 5 mg (5 mg Oral Given 09/24/17 0916)  atorvastatin (LIPITOR) tablet 40 mg (has no administration in time range)  clotrimazole (LOTRIMIN) 1 % cream ( Topical Given 09/24/17 0921)  cycloSPORINE (RESTASIS) 0.05 % ophthalmic emulsion 1 drop (has no administration in time range)  fluticasone (FLONASE) 50 MCG/ACT nasal spray 2 spray (has no administration in time range)  isosorbide-hydrALAZINE (BIDIL) 20-37.5 MG per tablet 1 tablet (1 tablet Oral Given 09/24/17 0920)  metoprolol succinate (TOPROL-XL) 24 hr tablet 50 mg (50 mg Oral Given 09/24/17 0916)  nitroGLYCERIN (NITROSTAT) SL tablet 0.4 mg (has no administration in time range)  sodium chloride flush (NS) 0.9 % injection 3 mL (3 mLs Intravenous Given 09/24/17 0917)  sodium chloride flush (NS) 0.9 % injection 3 mL (has no administration in time range)  0.9 %  sodium chloride infusion (has no administration in time range)  acetaminophen (TYLENOL) tablet 650 mg (has no administration in time  range)    Or  acetaminophen (TYLENOL) suppository 650 mg (has no administration in time range)  ondansetron (ZOFRAN) tablet 4 mg (has no administration in time range)    Or  ondansetron (ZOFRAN) injection 4 mg (has no administration in time range)  potassium chloride SA (K-DUR,KLOR-CON) CR tablet 40 mEq (40 mEq Oral Given 09/24/17 0916)  furosemide (LASIX) injection 40 mg (40 mg Intravenous Given 09/24/17 0540)  furosemide (LASIX) injection 40 mg (40 mg Intravenous Given 09/23/17 2152)     Initial Impression / Assessment and Plan / ED Course  I have reviewed the triage vital signs and the nursing notes.  Pertinent labs & imaging results that were available during my care of the patient were reviewed by me and considered in my medical decision making (see chart for details).  Clinical Course as of Sep 25 1026  Wed Sep 23, 2017  2118 High  CBG monitoring, ED(!) [EW]  2119 Normal except platelets low  CBC(!) [EW]  2134 Volume overload with atelectasis versus pneumonia left base.  Images reviewed  DG Chest 2 View [EW]  2135 Elevated, at baseline.  Troponin I(!!) [EW]  2135 CBC(!) [EW]    Clinical Course User Index [EW] Daleen Bo, MD     Patient Vitals for the past 24 hrs:  BP Temp Temp src Pulse Resp SpO2 Weight  09/24/17 0538 (!) 131/94 97.8 F (36.6 C) Oral 69 - 98 % -  09/24/17 0500 - - - - - - 96.4 kg (212 lb 8.4 oz)  09/24/17 0117 138/73 97.6 F (36.4 C) Oral 64 - 95 % -  09/23/17 2330 120/69 - - 61 17 96 % -  09/23/17 2240 107/72 - - 61 18 98 % -  09/23/17 2200 108/62 - - (!) 53 14 96 % -  09/23/17 2130 127/71 - - 66 18 96 % -  09/23/17 2030 123/61 - - 73 20 96 % -  09/23/17 2004 131/74 98.8 F (37.1 C) Oral 72 15 93 % -    9:37 PM Reevaluation with  update and discussion. After initial assessment and treatment, an updated evaluation reveals no change in clinical status.  Findings discussed with the patient and all questions were answered. Daleen Bo    Medical Decision Making: Nonspecific symptoms.  Patient recently had cardiac catheterization with stenting, 08/21/2017.  He had three-vessel native coronary occlusions.  Remainder of non-stented bypass grafts were patent.  He has a history of congestive heart failure with EF 35-40% on recent echo and cath.  Evaluation today consistent with acute on chronic congestive heart failure.  Patient with recent cardiac catheterization with stenting of bypass graft.  Patient is high risk for decompensation will require hospitalization for further evaluation and treatment.  CRITICAL CARE-yes Performed by: Daleen Bo  Nursing Notes Reviewed/ Care Coordinated Applicable Imaging Reviewed Interpretation of Laboratory Data incorporated into ED treatment   10:21 PM-Consult complete with hospitalist. Patient case explained and discussed.  He agrees to admit patient for further evaluation and treatment. Call ended at 10:28 AM  Plan: Admit  Final Clinical Impressions(s) / ED Diagnoses   Final diagnoses:  Acute on chronic congestive heart failure, unspecified heart failure type [facility])    ED Discharge Orders    None       Daleen Bo, MD 09/24/17 1028

## 2017-09-24 DIAGNOSIS — I10 Essential (primary) hypertension: Secondary | ICD-10-CM

## 2017-09-24 DIAGNOSIS — I509 Heart failure, unspecified: Secondary | ICD-10-CM

## 2017-09-24 DIAGNOSIS — G4733 Obstructive sleep apnea (adult) (pediatric): Secondary | ICD-10-CM

## 2017-09-24 DIAGNOSIS — N183 Chronic kidney disease, stage 3 (moderate): Secondary | ICD-10-CM

## 2017-09-24 LAB — BASIC METABOLIC PANEL
Anion gap: 9 (ref 5–15)
BUN: 14 mg/dL (ref 6–20)
CO2: 29 mmol/L (ref 22–32)
Calcium: 8.9 mg/dL (ref 8.9–10.3)
Chloride: 103 mmol/L (ref 101–111)
Creatinine, Ser: 1.47 mg/dL — ABNORMAL HIGH (ref 0.61–1.24)
GFR calc Af Amer: 54 mL/min — ABNORMAL LOW (ref >60)
GFR calc non Af Amer: 46 mL/min — ABNORMAL LOW (ref >60)
Glucose, Bld: 115 mg/dL — ABNORMAL HIGH (ref 65–99)
Potassium: 3.5 mmol/L (ref 3.5–5.1)
Sodium: 141 mmol/L (ref 135–145)

## 2017-09-24 LAB — CBC
HCT: 49.9 % (ref 39.0–52.0)
Hemoglobin: 16.7 g/dL (ref 13.0–17.0)
MCH: 30 pg (ref 26.0–34.0)
MCHC: 33.5 g/dL (ref 30.0–36.0)
MCV: 89.6 fL (ref 78.0–100.0)
Platelets: 143 10*3/uL — ABNORMAL LOW (ref 150–400)
RBC: 5.57 MIL/uL (ref 4.22–5.81)
RDW: 13.5 % (ref 11.5–15.5)
WBC: 5.5 10*3/uL (ref 4.0–10.5)

## 2017-09-24 LAB — TROPONIN I
Troponin I: 0.05 ng/mL (ref <0.03)
Troponin I: 0.06 ng/mL (ref <0.03)
Troponin I: 0.05 ng/mL (ref <0.03)

## 2017-09-24 LAB — MAGNESIUM: Magnesium: 1.8 mg/dL (ref 1.7–2.4)

## 2017-09-24 MED ORDER — FUROSEMIDE 40 MG PO TABS: 40.0000 mg | ORAL_TABLET | Freq: Every day | ORAL | 0 refills | Status: DC

## 2017-09-24 MED ORDER — CLOPIDOGREL BISULFATE 75 MG PO TABS
75.0000 mg | ORAL_TABLET | Freq: Every day | ORAL | Status: DC
  Administered 2017-09-24: 75 mg via ORAL
  Filled 2017-09-24: qty 1

## 2017-09-24 MED ORDER — ONDANSETRON HCL 4 MG PO TABS: 4.0000 mg | ORAL_TABLET | Freq: Four times a day (QID) | ORAL | Status: DC | PRN

## 2017-09-24 MED ORDER — ISOSORB DINITRATE-HYDRALAZINE 20-37.5 MG PO TABS
1.0000 | ORAL_TABLET | Freq: Three times a day (TID) | ORAL | Status: DC
  Administered 2017-09-24 (×2): 1 via ORAL
  Filled 2017-09-24 (×4): qty 1

## 2017-09-24 MED ORDER — NITROGLYCERIN 0.4 MG SL SUBL: 0.4000 mg | SUBLINGUAL_TABLET | SUBLINGUAL | Status: DC | PRN

## 2017-09-24 MED ORDER — FLUTICASONE PROPIONATE 50 MCG/ACT NA SUSP: 2.0000 | Freq: Two times a day (BID) | NASAL | Status: DC | PRN

## 2017-09-24 MED ORDER — SODIUM CHLORIDE 0.9% FLUSH: 3.0000 mL | INTRAVENOUS | Status: DC | PRN

## 2017-09-24 MED ORDER — METOPROLOL SUCCINATE ER 50 MG PO TB24
50.0000 mg | ORAL_TABLET | Freq: Every day | ORAL | Status: DC
  Administered 2017-09-24: 50 mg via ORAL
  Filled 2017-09-24: qty 1

## 2017-09-24 MED ORDER — FUROSEMIDE 10 MG/ML IJ SOLN
40.0000 mg | Freq: Two times a day (BID) | INTRAMUSCULAR | Status: DC
  Administered 2017-09-24: 40 mg via INTRAVENOUS
  Filled 2017-09-24 (×2): qty 4

## 2017-09-24 MED ORDER — CLOTRIMAZOLE 1 % EX CREA
TOPICAL_CREAM | Freq: Two times a day (BID) | CUTANEOUS | Status: DC
  Administered 2017-09-24: 09:00:00 via TOPICAL
  Filled 2017-09-24: qty 15

## 2017-09-24 MED ORDER — ONDANSETRON HCL 4 MG/2ML IJ SOLN: 4.0000 mg | Freq: Four times a day (QID) | INTRAMUSCULAR | Status: DC | PRN

## 2017-09-24 MED ORDER — ACETAMINOPHEN 650 MG RE SUPP: 650.0000 mg | Freq: Four times a day (QID) | RECTAL | Status: DC | PRN

## 2017-09-24 MED ORDER — POTASSIUM CHLORIDE CRYS ER 20 MEQ PO TBCR
40.0000 meq | EXTENDED_RELEASE_TABLET | Freq: Two times a day (BID) | ORAL | Status: DC
  Administered 2017-09-24 (×2): 40 meq via ORAL
  Filled 2017-09-24 (×2): qty 2

## 2017-09-24 MED ORDER — CYCLOSPORINE 0.05 % OP EMUL: 1.0000 [drp] | Freq: Every day | OPHTHALMIC | Status: DC | PRN

## 2017-09-24 MED ORDER — ATORVASTATIN CALCIUM 40 MG PO TABS
40.0000 mg | ORAL_TABLET | Freq: Every day | ORAL | Status: DC
  Administered 2017-09-24: 40 mg via ORAL
  Filled 2017-09-24: qty 1

## 2017-09-24 MED ORDER — ACETAMINOPHEN 325 MG PO TABS: 650.0000 mg | ORAL_TABLET | Freq: Four times a day (QID) | ORAL | Status: DC | PRN

## 2017-09-24 MED ORDER — SODIUM CHLORIDE 0.9 % IV SOLN: 250.0000 mL | INTRAVENOUS | Status: DC | PRN

## 2017-09-24 MED ORDER — APIXABAN 5 MG PO TABS
5.0000 mg | ORAL_TABLET | Freq: Two times a day (BID) | ORAL | Status: DC
  Administered 2017-09-24 (×2): 5 mg via ORAL
  Filled 2017-09-24 (×2): qty 1

## 2017-09-24 MED ORDER — SODIUM CHLORIDE 0.9% FLUSH
3.0000 mL | Freq: Two times a day (BID) | INTRAVENOUS | Status: DC
  Administered 2017-09-24 (×2): 3 mL via INTRAVENOUS

## 2017-09-24 NOTE — Progress Notes (Signed)
Patient discharged home.  IV removed - WNL.  Reviewed DC instructions and medication changes.  Reviewed HF management at home and handouts given. Verbalized understanding.  No questions at this time.  Patient assisted off floor in NAD

## 2017-09-24 NOTE — Progress Notes (Signed)
Patients O2 remained 94-96% while ambulating approx 500 feet on RA.  Patient tolerated well with no SOB.

## 2017-09-24 NOTE — Discharge Summary (Signed)
Physician Discharge Summary  Radell SITAR IBB:[id number] DOB: 04-22-1945 DOA: 09/23/2017  PCP: Rosita Fire, MD  Admit date: 09/23/2017 Discharge date: 09/24/2017  Admitted From: home Disposition:  home  Recommendations for Outpatient Follow-up:  1. Follow up with PCP in 1-2 weeks 2. Please obtain BMP/CBC in one week 3. Follow up with cardiology as previously scheduled  Home Health: Equipment/Devices:  Discharge Condition: stable CODE STATUS:full code Diet recommendation:  Low salt  Brief/Interim Summary: 72 year old male with a history of chronic systolic CHF with ejection fraction 35 to 40% and underwent cardiac catheterization on 5/17, admitted to the hospital with decompensated CHF and mild chest pain.  Patient received intravenous Lasix with improvement of shortness of breath.  He ruled out for ACS since he did not have a significant rise of troponins.  He is not had any further chest pain since coming to the hospital.  Patient reports compliance with medications.  Lasix dose has been increased from 20 mg daily to 40 mg daily.  At this time, volume status appears to be euvolemic.  He does not have any shortness of breath, able to ambulate without difficulty and does not have any lower extremity edema.  He reports having a cardiology follow-up scheduled in the near future.  Patient feels ready for discharge home.  Discharge Diagnoses:  Principal Problem:   CHF exacerbation ([place]) Active Problems:   Essential hypertension   Cerebral infarction ([place])   CKD (chronic kidney disease), stage III ([facility])   OSA (obstructive sleep apnea)   CHF (congestive heart failure) [facility])    Discharge Instructions  Discharge Instructions    Diet - low sodium heart healthy   Complete by:  As directed    Increase activity slowly   Complete by:  As directed      Allergies as of 09/24/2017      Reactions   Lisinopril Swelling, Other (See Comments)   Mouth and tongue swells      Medication  List    TAKE these medications   apixaban 5 MG Tabs tablet Commonly known as:  ELIQUIS Take 1 tablet (5 mg total) by mouth 2 (two) times daily. Restart 08/22/17.   atorvastatin 40 MG tablet Commonly known as:  LIPITOR Take 1 tablet (40 mg total) by mouth daily at 6 PM.   clopidogrel 75 MG tablet Commonly known as:  PLAVIX Take 1 tablet (75 mg total) by mouth daily.   clotrimazole-betamethasone cream Commonly known as:  LOTRISONE Apply 1 application topically daily as needed (for rah-irritation).   cycloSPORINE 0.05 % ophthalmic emulsion Commonly known as:  RESTASIS Place 1 drop into both eyes daily as needed (dry eye).   fluticasone 50 MCG/ACT nasal spray Commonly known as:  FLONASE Place 2 sprays into both nostrils 2 (two) times daily as needed for allergies or rhinitis.   furosemide 40 MG tablet Commonly known as:  LASIX Take 1 tablet (40 mg total) by mouth daily. What changed:    medication strength  how much to take   isosorbide-hydrALAZINE 20-37.5 MG tablet Commonly known as:  BIDIL Take 1 tablet by mouth 3 (three) times daily.   metoprolol succinate 50 MG 24 hr tablet Commonly known as:  TOPROL-XL Take 50 mg by mouth daily.   nitroGLYCERIN 0.4 MG SL tablet Commonly known as:  NITROSTAT Place 1 tablet (0.4 mg total) under the tongue every 5 (five) minutes as needed for chest pain. up to 3 doses.       Allergies  Allergen Reactions  .  Lisinopril Swelling and Other (See Comments)    Mouth and tongue swells    Consultations:     Procedures/Studies: Dg Chest 2 View  Result Date: 09/23/2017 CLINICAL DATA:  Chest pain starting at 1800 hours today. EXAM: CHEST - 2 VIEW COMPARISON:  03/20/2017 FINDINGS: Stable cardiomegaly with aortic atherosclerosis. The patient is status post median sternotomy. Diffuse interstitial edema is identified with slightly more confluent airspace opacities at the left lung base. No effusion or pneumothorax. No acute osseous  abnormality. Osteoarthritis of the AC joints bilaterally. IMPRESSION: Cardiomegaly with aortic atherosclerosis. Mild diffuse interstitial edema. Pulmonary opacity at the left lung base may represent superimposed pneumonia or atelectasis. Electronically Signed   By: Ashley Royalty M.D.   On: 09/23/2017 21:27       Subjective: Feeling better today.  Shortness of breath is better.  No chest pain.  Able to ambulate without difficulty.  Discharge Exam: Vitals:   09/24/17 0538 09/24/17 1532  BP: (!) 131/94 131/74  Pulse: 69 (!) 57  Resp:  20  Temp: 97.8 F (36.6 C) 97.6 F (36.4 C)  SpO2: 98% 97%   Vitals:   09/24/17 0117 09/24/17 0500 09/24/17 0538 09/24/17 1532  BP: 138/73  (!) 131/94 131/74  Pulse: 64  69 (!) 57  Resp:    20  Temp: 97.6 F (36.4 C)  97.8 F (36.6 C) 97.6 F (36.4 C)  TempSrc: Oral  Oral   SpO2: 95%  98% 97%  Weight:  96.4 kg (212 lb 8.4 oz)      General: Pt is alert, awake, not in acute distress Cardiovascular: RRR, S1/S2 +, no rubs, no gallops Respiratory: CTA bilaterally, no wheezing, no rhonchi Abdominal: Soft, NT, ND, bowel sounds + Extremities: no edema, no cyanosis    The results of significant diagnostics from this hospitalization (including imaging, microbiology, ancillary and laboratory) are listed below for reference.     Microbiology: No results found for this or any previous visit (from the past 240 hour(s)).   Labs: BNP (last 3 results) Recent Labs    03/20/17 0110 09/23/17 2006  BNP 257.0* 18.5   Basic Metabolic Panel: Recent Labs  Lab 09/23/17 2006 09/24/17 0633  NA 137 141  K 3.1* 3.5  CL 104 103  CO2 25 29  GLUCOSE 117* 115*  BUN 14 14  CREATININE 1.34* 1.47*  CALCIUM 8.6* 8.9  MG  --  1.8   Liver Function Tests: No results for input(s): AST, ALT, ALKPHOS, BILITOT, PROT, ALBUMIN in the last 168 hours. No results for input(s): LIPASE, AMYLASE in the last 168 hours. No results for input(s): AMMONIA in the last 168  hours. CBC: Recent Labs  Lab 09/23/17 2006 09/24/17 0633  WBC 6.9 5.5  HGB 15.0 16.7  HCT 46.1 49.9  MCV 89.3 89.6  PLT 143* 143*   Cardiac Enzymes: Recent Labs  Lab 09/23/17 2006 09/24/17 0038 09/24/17 0633 09/24/17 1239  TROPONINI 0.05* 0.05* 0.06* 0.05*   BNP: Invalid input(s): POCBNP CBG: Recent Labs  Lab 09/23/17 2017  GLUCAP 136*   D-Dimer No results for input(s): DDIMER in the last 72 hours. Hgb A1c No results for input(s): HGBA1C in the last 72 hours. Lipid Profile No results for input(s): CHOL, HDL, LDLCALC, TRIG, CHOLHDL, LDLDIRECT in the last 72 hours. Thyroid function studies No results for input(s): TSH, T4TOTAL, T3FREE, THYROIDAB in the last 72 hours.  Invalid input(s): FREET3 Anemia work up No results for input(s): VITAMINB12, FOLATE, FERRITIN, TIBC, IRON, RETICCTPCT in the  last 72 hours. Urinalysis    Component Value Date/Time   COLORURINE STRAW (A) 03/07/2017 1446   APPEARANCEUR CLEAR 03/07/2017 1446   LABSPEC 1.032 (H) 03/07/2017 1446   PHURINE 7.0 03/07/2017 1446   GLUCOSEU NEGATIVE 03/07/2017 1446   HGBUR NEGATIVE 03/07/2017 1446   BILIRUBINUR NEGATIVE 03/07/2017 1446   KETONESUR NEGATIVE 03/07/2017 1446   PROTEINUR 30 (A) 03/07/2017 1446   UROBILINOGEN 1.0 03/16/2010 0652   NITRITE NEGATIVE 03/07/2017 1446   LEUKOCYTESUR NEGATIVE 03/07/2017 1446   Sepsis Labs Invalid input(s): PROCALCITONIN,  WBC,  LACTICIDVEN Microbiology No results found for this or any previous visit (from the past 240 hour(s)).   Time coordinating discharge: 45mins  SIGNED:   Kathie Dike, MD  [facility] 09/24/2017, 5:51 PM Pager   If 7PM-7AM, please contact night-coverage [email or web address] Password [email or web address]

## 2017-09-24 NOTE — H&P (Addendum)
History and Physical    [name]:[id number] DOB: [birth year] DOA: 09/23/2017  PCP: Rosita Fire, MD   Patient coming from: Home  Chief Complaint: Chest pressure/Dyspnea  HPI: [name] is a 72 y.o. male with medical history significant for retention, dyslipidemia, CKD stage III, CVA, OSA, chronic systolic heart failure with LVEF 35 to 40%, and three-vessel CAD with prior CABG and recent DES stent to native RCA on 08/21/2017 who presented to the emergency department with some mild chest pressure and dyspnea that began as he was working outside and lifting some heavy objects.  According to his daughters at the bedside, patient has been much more active than he should be and has been trying to help them move furniture.  He has been compliant with his medications and has not noticed any recent weight gain or lower extremity swelling.  He denies any cough, congestion, nausea, vomiting, diaphoresis, palpitations, lightheadedness, or dizziness. No recent fever or chills noted.   ED Course: Vital signs have been stable.  Troponin is 0.05 on initial check and potassium is 3.1.  Creatinine is 1.34 which is consistent with a stage III kidney disease.  Chest x-ray demonstrates some cardiomegaly with diffuse interstitial edema as well as some pulmonary opacity to the left lower lung base with questionable atelectasis.  EKG with sinus rhythm at 78 bpm and no other acute findings.  Patient denies any further symptomatology at this time and has been given some IV Lasix 40 mg.  Review of Systems: All others reviewed and otherwise negative.  Past Medical History:  Diagnosis Date  . Apical mural thrombus   . Arteriosclerotic cardiovascular disease (ASCVD) 1998   Inferior MI->PCI of the RCA in 1998; acute anterior MI in 06/2002->  PCI of RCA and LAD with DES x2 to RCA, residual 80% ostial D1, and 80% mid CX and EF-40%; CABG-2011, LIMA-LAD, SVG to D1, OM1 & OM2; EF of 35-40% in 07/2010  . Chronic  anticoagulation   . Chronic systolic CHF (congestive heart failure) ([place])   . CVA (cerebral vascular accident) ([place])   . HTN (hypertension)   . Hyperlipidemia   . Keloid    median sternotomy  . Pulmonary embolism ([place]) 03/2010  . Substance abuse [facility])    formerly cocaine    Past Surgical History:  Procedure Laterality Date  . COLONOSCOPY N/A 01/22/2017   Procedure: COLONOSCOPY;  Surgeon: Danie Binder, MD;  [facility];  Service: Endoscopy;  Laterality: N/A;  200  . CORONARY ARTERY BYPASS GRAFT  03/18/2010   LIMA-LAD, SVG to diagonal, OM1 & OM2  . CORONARY STENT INTERVENTION N/A 08/21/2017   Procedure: CORONARY STENT INTERVENTION;  Surgeon: [name], MD;  Location: [place] CV LAB;  Service: Cardiovascular;  Laterality: N/A;  . LEFT HEART CATH AND CORS/GRAFTS ANGIOGRAPHY N/A 08/21/2017   Procedure: LEFT HEART CATH AND CORS/GRAFTS ANGIOGRAPHY;  Surgeon: [name], MD;  Location: [place] CV LAB;  Service: Cardiovascular;  Laterality: N/A;  . POLYPECTOMY  01/22/2017   Procedure: POLYPECTOMY;  Surgeon: Danie Binder, MD;  [facility];  Service: Endoscopy;;  Transverse(CS) and sigmoid colon(HS)  . PTCA  06/1996   LAD & RCA  . TEE WITHOUT CARDIOVERSION N/A 06/10/2012   Procedure: TRANSESOPHAGEAL ECHOCARDIOGRAM (TEE);  Surgeon: Josue Hector, MD;  [facility];  Service: Cardiovascular;  Laterality: N/A;     reports that he quit smoking about 15 years ago. He has never used smokeless tobacco. He  reports that he drinks alcohol. He reports that he does not use drugs.  Allergies  Allergen Reactions  . Lisinopril Swelling and Other (See Comments)    Mouth and tongue swells    Family History  Problem Relation Age of Onset  . Cancer Mother 40    Prior to Admission medications   Medication Sig Start Date End Date Taking? Authorizing Provider  apixaban (ELIQUIS) 5 MG TABS tablet Take 1 tablet (5 mg total) by mouth 2 (two) times  daily. Restart 08/22/17. 08/21/17  Yes Isaiah Serge, NP  atorvastatin (LIPITOR) 40 MG tablet Take 1 tablet (40 mg total) by mouth daily at 6 PM. 03/09/17  Yes Rizwan, Eunice Blase, MD  clotrimazole-betamethasone (LOTRISONE) cream Apply 1 application topically daily as needed (for rah-irritation).   Yes [provider]  cycloSPORINE (RESTASIS) 0.05 % ophthalmic emulsion Place 1 drop into both eyes daily as needed (dry eye).    Yes [provider]  fluticasone (FLONASE) 50 MCG/ACT nasal spray Place 2 sprays into both nostrils 2 (two) times daily as needed for allergies or rhinitis.   Yes [provider]  furosemide (LASIX) 20 MG tablet Take 1 tablet (20 mg total) by mouth daily. 03/22/17  Yes Johnson, Clanford L, MD  isosorbide-hydrALAZINE (BIDIL) 20-37.5 MG tablet Take 1 tablet by mouth 3 (three) times daily. 05/18/17  Yes Herminio Commons, MD  metoprolol succinate (TOPROL-XL) 50 MG 24 hr tablet Take 50 mg by mouth daily. 04/27/17  Yes [provider]  nitroGLYCERIN (NITROSTAT) 0.4 MG SL tablet Place 1 tablet (0.4 mg total) under the tongue every 5 (five) minutes as needed for chest pain. up to 3 doses. 07/05/13  Yes Lendon Colonel, NP  clopidogrel (PLAVIX) 75 MG tablet Take 1 tablet (75 mg total) by mouth daily. Patient not taking: Reported on 09/23/2017 08/21/17   [name], MD    Physical Exam: Vitals:   09/23/17 2130 09/23/17 2200 09/23/17 2240 09/23/17 2330  BP: 127/71 108/62 107/72 120/69  Pulse: 66 (!) 53 61 61  Resp: 18 14 18 17   Temp:      TempSrc:      SpO2: 96% 96% 98% 96%    Constitutional: NAD, calm, comfortable Vitals:   09/23/17 2130 09/23/17 2200 09/23/17 2240 09/23/17 2330  BP: 127/71 108/62 107/72 120/69  Pulse: 66 (!) 53 61 61  Resp: 18 14 18 17   Temp:      TempSrc:      SpO2: 96% 96% 98% 96%   Eyes: lids and conjunctivae normal ENMT: Mucous membranes are moist.  Neck: normal, supple Respiratory: clear to auscultation  bilaterally. Normal respiratory effort. No accessory muscle use.  Cardiovascular: Regular rate and rhythm, no murmurs. No extremity edema. Abdomen: no tenderness, no distention. Bowel sounds positive.  Musculoskeletal:  No joint deformity upper and lower extremities.   Skin: no rashes, lesions, ulcers.  Psychiatric: Normal judgment and insight. Alert and oriented x 3. Normal mood.   Labs on Admission: I have personally reviewed following labs and imaging studies  CBC: Recent Labs  Lab 09/23/17 2006  WBC 6.9  HGB 15.0  HCT 46.1  MCV 89.3  PLT 440*   Basic Metabolic Panel: Recent Labs  Lab 09/23/17 2006  NA 137  K 3.1*  CL 104  CO2 25  GLUCOSE 117*  BUN 14  CREATININE 1.34*  CALCIUM 8.6*   GFR: CrCl cannot be calculated (Unknown ideal weight.). Liver Function Tests: No results for input(s): AST, ALT, ALKPHOS, BILITOT,  PROT, ALBUMIN in the last 168 hours. No results for input(s): LIPASE, AMYLASE in the last 168 hours. No results for input(s): AMMONIA in the last 168 hours. Coagulation Profile: No results for input(s): INR, PROTIME in the last 168 hours. Cardiac Enzymes: Recent Labs  Lab 09/23/17 2006  TROPONINI 0.05*   BNP (last 3 results) No results for input(s): PROBNP in the last 8760 hours. HbA1C: No results for input(s): HGBA1C in the last 72 hours. CBG: Recent Labs  Lab 09/23/17 2017  GLUCAP 136*   Lipid Profile: No results for input(s): CHOL, HDL, LDLCALC, TRIG, CHOLHDL, LDLDIRECT in the last 72 hours. Thyroid Function Tests: No results for input(s): TSH, T4TOTAL, FREET4, T3FREE, THYROIDAB in the last 72 hours. Anemia Panel: No results for input(s): VITAMINB12, FOLATE, FERRITIN, TIBC, IRON, RETICCTPCT in the last 72 hours. Urine analysis:    Component Value Date/Time   COLORURINE STRAW (A) 03/07/2017 1446   APPEARANCEUR CLEAR 03/07/2017 1446   LABSPEC 1.032 (H) 03/07/2017 1446   PHURINE 7.0 03/07/2017 1446   GLUCOSEU NEGATIVE 03/07/2017 1446    HGBUR NEGATIVE 03/07/2017 1446   BILIRUBINUR NEGATIVE 03/07/2017 1446   KETONESUR NEGATIVE 03/07/2017 1446   PROTEINUR 30 (A) 03/07/2017 1446   UROBILINOGEN 1.0 03/16/2010 0652   NITRITE NEGATIVE 03/07/2017 1446   LEUKOCYTESUR NEGATIVE 03/07/2017 1446    Radiological Exams on Admission: Dg Chest 2 View  Result Date: 09/23/2017 CLINICAL DATA:  Chest pain starting at 1800 hours today. EXAM: CHEST - 2 VIEW COMPARISON:  03/20/2017 FINDINGS: Stable cardiomegaly with aortic atherosclerosis. The patient is status post median sternotomy. Diffuse interstitial edema is identified with slightly more confluent airspace opacities at the left lung base. No effusion or pneumothorax. No acute osseous abnormality. Osteoarthritis of the AC joints bilaterally. IMPRESSION: Cardiomegaly with aortic atherosclerosis. Mild diffuse interstitial edema. Pulmonary opacity at the left lung base may represent superimposed pneumonia or atelectasis. Electronically Signed   By: Ashley Royalty M.D.   On: 09/23/2017 21:27    EKG: Independently reviewed. NSR 78bpm. Age-indeterminant inferior and anterolat infarcts.  Assessment/Plan Principal Problem:   CHF exacerbation (HCC) Active Problems:   Essential hypertension   Cerebral infarction ([place])   CKD (chronic kidney disease), stage III (HCC)   OSA (obstructive sleep apnea)    1. Mild acute on chronic systolic CHF exacerbation.  Continue diuresis with IV Lasix twice daily and monitor I's and O's as well as daily weights.  No need for repeat echo as this has recently been performed.  Patient is already feeling much better at this point in time and may consider ambulation in a.m. and potential discharge.  Fluid restriction.  Continue on metoprolol and Bidil.  Likely follow-up with cardiology in the outpatient setting if improved in a.m. 2. Elevated troponin.  This is likely secondary to above and patient is otherwise asymptomatic with no further chest pain or shortness of breath  noted.  Continue to trend and ensure no sign of ACS.  Monitor on telemetry. 3. Hypokalemia. Replete orally and recheck labs with Mg in am. 4. Severe three-vessel CAD with recent stent.  Continue Eliquis as well as Plavix and atorvastatin. 5. CKD stage III.  Appears to be at baseline. 6. OSA.  Questionable use of CPAP at home.  Monitor with continuous pulse oximetry for now.  Patient not requiring oxygen while awake.   DVT prophylaxis: Eliquis Code Status: Full Family Communication: Daughters at bedside Disposition Plan:Diuresis for CHF exacerbation; r/o ACS Consults called:None Admission status: Obs, tele   Jhase Creppel  Darleen Crocker DO [facility] Pager [phone]  If 7PM-7AM, please contact night-coverage [email or web address] Password [email or web address]  09/24/2017, 12:10 AM

## 2017-09-25 ENCOUNTER — Encounter (HOSPITAL_COMMUNITY): Payer: Medicare HMO

## 2017-10-02 DIAGNOSIS — E669 Obesity, unspecified: Secondary | ICD-10-CM | POA: Diagnosis not present

## 2017-10-02 DIAGNOSIS — I6350 Cerebral infarction due to unspecified occlusion or stenosis of unspecified cerebral artery: Secondary | ICD-10-CM | POA: Diagnosis not present

## 2017-10-02 DIAGNOSIS — I10 Essential (primary) hypertension: Secondary | ICD-10-CM | POA: Diagnosis not present

## 2017-10-02 DIAGNOSIS — I499 Cardiac arrhythmia, unspecified: Secondary | ICD-10-CM | POA: Diagnosis not present

## 2017-10-02 DIAGNOSIS — H40003 Preglaucoma, unspecified, bilateral: Secondary | ICD-10-CM | POA: Diagnosis not present

## 2017-10-02 DIAGNOSIS — I2510 Atherosclerotic heart disease of native coronary artery without angina pectoris: Secondary | ICD-10-CM | POA: Diagnosis not present

## 2017-10-02 DIAGNOSIS — I5022 Chronic systolic (congestive) heart failure: Secondary | ICD-10-CM | POA: Diagnosis not present

## 2017-10-02 DIAGNOSIS — G4733 Obstructive sleep apnea (adult) (pediatric): Secondary | ICD-10-CM | POA: Diagnosis not present

## 2017-10-02 DIAGNOSIS — R739 Hyperglycemia, unspecified: Secondary | ICD-10-CM | POA: Diagnosis not present

## 2017-10-19 DIAGNOSIS — I6930 Unspecified sequelae of cerebral infarction: Secondary | ICD-10-CM | POA: Diagnosis not present

## 2017-10-19 DIAGNOSIS — I10 Essential (primary) hypertension: Secondary | ICD-10-CM | POA: Diagnosis not present

## 2017-10-19 DIAGNOSIS — G4731 Primary central sleep apnea: Secondary | ICD-10-CM | POA: Diagnosis not present

## 2017-10-19 DIAGNOSIS — E782 Mixed hyperlipidemia: Secondary | ICD-10-CM | POA: Diagnosis not present

## 2017-10-19 DIAGNOSIS — G4733 Obstructive sleep apnea (adult) (pediatric): Secondary | ICD-10-CM | POA: Diagnosis not present

## 2017-10-27 ENCOUNTER — Encounter (HOSPITAL_COMMUNITY): Payer: Medicare HMO

## 2017-11-02 ENCOUNTER — Ambulatory Visit: Payer: Self-pay | Admitting: Cardiovascular Disease

## 2017-11-04 ENCOUNTER — Ambulatory Visit (INDEPENDENT_AMBULATORY_CARE_PROVIDER_SITE_OTHER): Payer: Medicare HMO | Admitting: Cardiovascular Disease

## 2017-11-04 ENCOUNTER — Encounter: Payer: Self-pay | Admitting: Cardiovascular Disease

## 2017-11-04 VITALS — BP 120/68 | HR 77 | Ht 66.0 in | Wt 221.6 lb

## 2017-11-04 DIAGNOSIS — Z8673 Personal history of transient ischemic attack (TIA), and cerebral infarction without residual deficits: Secondary | ICD-10-CM | POA: Diagnosis not present

## 2017-11-04 DIAGNOSIS — I10 Essential (primary) hypertension: Secondary | ICD-10-CM

## 2017-11-04 DIAGNOSIS — Z955 Presence of coronary angioplasty implant and graft: Secondary | ICD-10-CM | POA: Diagnosis not present

## 2017-11-04 DIAGNOSIS — I513 Intracardiac thrombosis, not elsewhere classified: Secondary | ICD-10-CM

## 2017-11-04 DIAGNOSIS — I5022 Chronic systolic (congestive) heart failure: Secondary | ICD-10-CM | POA: Diagnosis not present

## 2017-11-04 DIAGNOSIS — E785 Hyperlipidemia, unspecified: Secondary | ICD-10-CM | POA: Diagnosis not present

## 2017-11-04 DIAGNOSIS — I25708 Atherosclerosis of coronary artery bypass graft(s), unspecified, with other forms of angina pectoris: Secondary | ICD-10-CM | POA: Diagnosis not present

## 2017-11-04 DIAGNOSIS — I429 Cardiomyopathy, unspecified: Secondary | ICD-10-CM

## 2017-11-04 NOTE — Patient Instructions (Signed)
Your physician recommends that you schedule a follow-up appointment in: December with [place]    Your physician recommends that you continue on your current medications as directed. Please refer to the Current Medication list given to you today.    If you need a refill on your cardiac medications before your next appointment, please call your pharmacy.      No labs or tests today      Thank you for choosing [facility] !

## 2017-11-04 NOTE — Progress Notes (Signed)
SUBJECTIVE: The patient presents for routine follow-up.  He underwent drug-eluting stent placement to the right coronary artery on 08/21/2017.  Cardiac catheterization demonstrated continued patency of the LIMA to LAD, SVG to diagonal, and sequential SVG to OM1 and OM 2. It was recommended he stay on Plavix for 6 months and then aspirin afterwards.  He is on apixaban.  Echocardiogram on 08/11/2017 showed moderately reduced left ventricular systolic function, LVEF 35 to 40%, mild LVH, near resolution of prior apical thrombus with a small remnant remaining.  The patient denies any symptoms of chest pain, palpitations, shortness of breath, lightheadedness, dizziness, leg swelling, orthopnea, PND, and syncope.  He walks at [facility] 4 days/week and tries to walk at least 45 minutes each time.  He is planning on attending cardiac rehabilitation.  He denies bleeding problems.     Review of Systems: As per "subjective", otherwise negative.  Allergies  Allergen Reactions  . Lisinopril Swelling and Other (See Comments)    Mouth and tongue swells    Current Outpatient Medications  Medication Sig Dispense Refill  . apixaban (ELIQUIS) 5 MG TABS tablet Take 1 tablet (5 mg total) by mouth 2 (two) times daily. Restart 08/22/17. 60 tablet 0  . atorvastatin (LIPITOR) 40 MG tablet Take 1 tablet (40 mg total) by mouth daily at 6 PM. 30 tablet 0  . clotrimazole-betamethasone (LOTRISONE) cream Apply 1 application topically daily as needed (for rah-irritation).    . cycloSPORINE (RESTASIS) 0.05 % ophthalmic emulsion Place 1 drop into both eyes daily as needed (dry eye).     . fluticasone (FLONASE) 50 MCG/ACT nasal spray Place 2 sprays into both nostrils 2 (two) times daily as needed for allergies or rhinitis.    . furosemide (LASIX) 40 MG tablet Take 1 tablet (40 mg total) by mouth daily. 30 tablet 0  . isosorbide-hydrALAZINE (BIDIL) 20-37.5 MG tablet Take 1 tablet by mouth 3 (three) times daily. 90 tablet  6  . metoprolol succinate (TOPROL-XL) 50 MG 24 hr tablet Take 50 mg by mouth daily.  3  . nitroGLYCERIN (NITROSTAT) 0.4 MG SL tablet Place 1 tablet (0.4 mg total) under the tongue every 5 (five) minutes as needed for chest pain. up to 3 doses. 25 tablet 4   No current facility-administered medications for this visit.     Past Medical History:  Diagnosis Date  . Apical mural thrombus   . Arteriosclerotic cardiovascular disease (ASCVD) 1998   Inferior MI->PCI of the RCA in 1998; acute anterior MI in 06/2002->  PCI of RCA and LAD with DES x2 to RCA, residual 80% ostial D1, and 80% mid CX and EF-40%; CABG-2011, LIMA-LAD, SVG to D1, OM1 & OM2; EF of 35-40% in 07/2010  . Chronic anticoagulation   . Chronic systolic CHF (congestive heart failure) ([place])   . CVA (cerebral vascular accident) ([place])   . HTN (hypertension)   . Hyperlipidemia   . Keloid    median sternotomy  . Pulmonary embolism ([place]) 03/2010  . 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 COLONOSCOPY N/A 01/22/2017   Procedure: COLONOSCOPY;  Surgeon: Danie Binder, MD;  [facility];  Service: Endoscopy;  Laterality: N/A;  200  . CORONARY ARTERY BYPASS GRAFT  03/18/2010   LIMA-LAD, SVG to diagonal, OM1 & OM2  . CORONARY STENT INTERVENTION N/A 08/21/2017   Procedure: CORONARY STENT INTERVENTION;  Surgeon: [name], MD;  [facility]: [facility]  INVASIVE CV LAB;  Service: Cardiovascular;  Laterality: N/A;  . LEFT HEART CATH AND CORS/GRAFTS ANGIOGRAPHY N/A 08/21/2017   Procedure: LEFT HEART CATH AND CORS/GRAFTS ANGIOGRAPHY;  Surgeon: [name], MD;  Location: [place] CV LAB;  Service: Cardiovascular;  Laterality: N/A;  . POLYPECTOMY  01/22/2017   Procedure: POLYPECTOMY;  Surgeon: Danie Binder, MD;  [facility];  Service: Endoscopy;;  Transverse(CS) and sigmoid colon(HS)  . PTCA  06/1996   LAD & RCA  . TEE WITHOUT CARDIOVERSION N/A 06/10/2012    Procedure: TRANSESOPHAGEAL ECHOCARDIOGRAM (TEE);  Surgeon: Josue Hector, MD;  [facility];  Service: Cardiovascular;  Laterality: N/A;    Social History   Socioeconomic History  . Marital status: Widowed    Spouse name: Not on file  . Number of children: Not on file  . Years of education: Not on file  . Highest education level: Not on file  Occupational History  . Occupation: disability  Social Needs  . Financial resource strain: Not on file  . Food insecurity:    Worry: Not on file    Inability: Not on file  . Transportation needs:    Medical: Not on file    Non-medical: Not on file  Tobacco Use  . Smoking status: Former Smoker    Last attempt to quit: 2004    Years since quitting: 15.5  . Smokeless tobacco: Never Used  Substance and Sexual Activity  . Alcohol use: Yes    Comment: rare  . Drug use: No    Types: Cocaine    Comment: Former cocaine abuse - last use for 51th birthday  . Sexual activity: Not on file  Lifestyle  . Physical activity:    Days per week: Not on file    Minutes per session: Not on file  . Stress: Not on file  Relationships  . Social connections:    Talks on phone: Not on file    Gets together: Not on file    Attends religious service: Not on file    Active member of club or organization: Not on file    Attends meetings of clubs or organizations: Not on file    Relationship status: Not on file  . Intimate partner violence:    Fear of current or ex partner: Not on file    Emotionally abused: Not on file    Physically abused: Not on file    Forced sexual activity: Not on file  Other Topics Concern  . Not on file  Social History Narrative  . Not on file     Vitals:   11/04/17 1129  BP: 120/68  Pulse: 77  SpO2: 92%  Weight: 221 lb 9.6 oz (100.5 kg)  Height: 5\' 6"  (1.676 m)    Wt Readings from Last 3 Encounters:  11/04/17 221 lb 9.6 oz (100.5 kg)  09/24/17 212 lb 8.4 oz (96.4 kg)  08/27/17 220 lb 12.8 oz (100.2 kg)       PHYSICAL EXAM General: NAD HEENT: Normal. Neck: No JVD, no thyromegaly. Lungs: Clear to auscultation bilaterally with normal respiratory effort. CV: Regular rate and rhythm, normal S1/S2, no S3/S4, no murmur. No pretibial or periankle edema.  No carotid bruit.   Abdomen: Soft, nontender, no distention.  Neurologic: Alert and oriented.  Psych: Normal affect. Skin: Normal. Musculoskeletal: No gross deformities.    ECG: Reviewed above under Subjective   Labs: Lab Results  Component Value Date/Time   K 3.5 09/24/2017 06:33 AM  BUN 14 09/24/2017 06:33 AM   CREATININE 1.47 (H) 09/24/2017 06:33 AM   ALT 39 03/20/2017 06:00 AM   TSH 1.370 03/07/2017 02:57 PM   TSH 1.043 06/07/2012 09:58 AM   HGB 16.7 09/24/2017 06:33 AM     Lipids: Lab Results  Component Value Date/Time   LDLCALC 58 03/09/2017 04:58 AM   CHOL 116 03/09/2017 04:58 AM   TRIG 76 03/09/2017 04:58 AM   HDL 43 03/09/2017 04:58 AM       ASSESSMENT AND PLAN: 1. CAD with CABG  status post drug-eluting stent placement to the RDE:YCXKGY ischemic heart disease. Continue metoprolol succinate andatorvastatin 40 mg. Allergic to ACE inhibitors/angiotensin receptor blockers.  Plan is to continue Plavix for a total of 6 months at which time it will be discontinued and low-dose aspirin will be resumed.  He is on Eliquis.  2. Hyperlipidemia:I will obtain a copy of most recent lipids. Continue Lipitor 40 mg.  3. H/o JEH:[id number] on Eliquis 5 mg twice daily.  4. Essential VZC:HYIFO pressure is normal. Allergic to ACE inhibitors/angiotensin receptor blockers. Continue BiDil 20/37.5 mg 3 times daily.  5. Chronic systolic heart failure, LVEF 35 to 40%: Currently on Lasix 40 mg daily and Toprol-XL 50 mg daily. Allergic to ACE inhibitors/angiotensin receptor blockers. Blood pressure is normal. I will continue BiDil 20/37.5 mg 3 times daily.    6.Apical thrombus: Currently on Eliquis 5 mg twice daily  (also with a history of cerebral thrombosis).  Echocardiogram in May showed near resolution of apical thrombus with a small remnant remaining as detailed above.       Disposition: Follow up December 2019   Kate Sable, M.D., F.A.C.C.

## 2017-11-05 DIAGNOSIS — I6350 Cerebral infarction due to unspecified occlusion or stenosis of unspecified cerebral artery: Secondary | ICD-10-CM | POA: Diagnosis not present

## 2017-11-05 DIAGNOSIS — I5022 Chronic systolic (congestive) heart failure: Secondary | ICD-10-CM | POA: Diagnosis not present

## 2017-11-27 ENCOUNTER — Encounter (HOSPITAL_COMMUNITY): Payer: Medicare HMO

## 2017-12-06 DIAGNOSIS — I6350 Cerebral infarction due to unspecified occlusion or stenosis of unspecified cerebral artery: Secondary | ICD-10-CM | POA: Diagnosis not present

## 2017-12-06 DIAGNOSIS — I5022 Chronic systolic (congestive) heart failure: Secondary | ICD-10-CM | POA: Diagnosis not present

## 2018-01-18 DIAGNOSIS — Z23 Encounter for immunization: Secondary | ICD-10-CM | POA: Diagnosis not present

## 2018-01-18 DIAGNOSIS — Z951 Presence of aortocoronary bypass graft: Secondary | ICD-10-CM | POA: Diagnosis not present

## 2018-01-18 DIAGNOSIS — G4733 Obstructive sleep apnea (adult) (pediatric): Secondary | ICD-10-CM | POA: Diagnosis not present

## 2018-01-18 DIAGNOSIS — I10 Essential (primary) hypertension: Secondary | ICD-10-CM | POA: Diagnosis not present

## 2018-02-02 ENCOUNTER — Other Ambulatory Visit: Payer: Self-pay | Admitting: Cardiovascular Disease

## 2018-03-25 ENCOUNTER — Emergency Department (HOSPITAL_COMMUNITY): Payer: Medicare Other

## 2018-03-25 ENCOUNTER — Emergency Department (HOSPITAL_COMMUNITY)
Admission: EM | Admit: 2018-03-25 | Discharge: 2018-03-25 | Disposition: A | Discharge status: Home / Self Care | Payer: Medicare Other | Attending: Emergency Medicine | Admitting: Emergency Medicine

## 2018-03-25 ENCOUNTER — Encounter (HOSPITAL_COMMUNITY): Payer: Self-pay | Admitting: Emergency Medicine

## 2018-03-25 ENCOUNTER — Other Ambulatory Visit: Payer: Self-pay

## 2018-03-25 DIAGNOSIS — Z7901 Long term (current) use of anticoagulants: Secondary | ICD-10-CM | POA: Insufficient documentation

## 2018-03-25 DIAGNOSIS — R002 Palpitations: Secondary | ICD-10-CM

## 2018-03-25 DIAGNOSIS — I130 Hypertensive heart and chronic kidney disease with heart failure and stage 1 through stage 4 chronic kidney disease, or unspecified chronic kidney disease: Secondary | ICD-10-CM | POA: Insufficient documentation

## 2018-03-25 DIAGNOSIS — Z7982 Long term (current) use of aspirin: Secondary | ICD-10-CM | POA: Insufficient documentation

## 2018-03-25 DIAGNOSIS — Z87891 Personal history of nicotine dependence: Secondary | ICD-10-CM | POA: Insufficient documentation

## 2018-03-25 DIAGNOSIS — I5022 Chronic systolic (congestive) heart failure: Secondary | ICD-10-CM | POA: Diagnosis not present

## 2018-03-25 DIAGNOSIS — Z79899 Other long term (current) drug therapy: Secondary | ICD-10-CM | POA: Diagnosis not present

## 2018-03-25 DIAGNOSIS — N183 Chronic kidney disease, stage 3 (moderate): Secondary | ICD-10-CM | POA: Insufficient documentation

## 2018-03-25 DIAGNOSIS — R079 Chest pain, unspecified: Secondary | ICD-10-CM | POA: Diagnosis not present

## 2018-03-25 LAB — CBC
HCT: 50.8 % (ref 39.0–52.0)
Hemoglobin: 15.6 g/dL (ref 13.0–17.0)
MCH: 28.2 pg (ref 26.0–34.0)
MCHC: 30.7 g/dL (ref 30.0–36.0)
MCV: 91.9 fL (ref 80.0–100.0)
Platelets: 152 10*3/uL (ref 150–400)
RBC: 5.53 MIL/uL (ref 4.22–5.81)
RDW: 13.8 % (ref 11.5–15.5)
WBC: 5 10*3/uL (ref 4.0–10.5)
nRBC: 0 % (ref 0.0–0.2)

## 2018-03-25 LAB — TROPONIN I
Troponin I: 0.07 ng/mL (ref <0.03)
Troponin I: 0.07 ng/mL (ref <0.03)

## 2018-03-25 LAB — TSH: TSH: 1.93 u[IU]/mL (ref 0.350–4.500)

## 2018-03-25 LAB — BASIC METABOLIC PANEL
Anion gap: 5 (ref 5–15)
BUN: 12 mg/dL (ref 8–23)
CO2: 28 mmol/L (ref 22–32)
Calcium: 8.8 mg/dL — ABNORMAL LOW (ref 8.9–10.3)
Chloride: 106 mmol/L (ref 98–111)
Creatinine, Ser: 1.35 mg/dL — ABNORMAL HIGH (ref 0.61–1.24)
GFR calc Af Amer: >60 mL/min (ref >60)
GFR calc non Af Amer: 52 mL/min — ABNORMAL LOW (ref >60)
Glucose, Bld: 159 mg/dL — ABNORMAL HIGH (ref 70–99)
Potassium: 3.6 mmol/L (ref 3.5–5.1)
Sodium: 139 mmol/L (ref 135–145)

## 2018-03-25 LAB — MAGNESIUM: Magnesium: 1.9 mg/dL (ref 1.7–2.4)

## 2018-03-25 MED ORDER — NITROGLYCERIN 0.4 MG SL SUBL
0.4000 mg | SUBLINGUAL_TABLET | Freq: Once | SUBLINGUAL | Status: AC
Start: 2018-03-25 — End: 2018-03-25
  Administered 2018-03-25: 0.4 mg via SUBLINGUAL
  Filled 2018-03-25: qty 1

## 2018-03-25 MED ORDER — ASPIRIN 325 MG PO TABS
325.0000 mg | ORAL_TABLET | Freq: Once | ORAL | Status: AC
  Administered 2018-03-25: 325 mg via ORAL
  Filled 2018-03-25: qty 1

## 2018-03-25 NOTE — Discharge Instructions (Addendum)
Follow-up with your doctor's appointment tomorrow as scheduled.

## 2018-03-25 NOTE — Consult Note (Signed)
Cardiology Consultation:   Patient ID: [name] MRN: [id number]; DOB: 10/23/1945  Admit date: 03/25/2018 Date of Consult: 03/25/2018  Primary Care Provider: Rosita Fire, MD Primary Cardiologist: Brian Sable, MD Primary Electrophysiologist:  None    Patient Profile:   [name] is a 72 y.o. male with a hx of CAD and chronic systolic HF who is being seen today for the evaluation of palpitations at the request of Dr Lacinda Axon.  History of Present Illness:   [name] history of CAD with prior CABG, DES to RCA in 08/2017. Chronic systolic HF LVEF 38-10%, apical thrombus, CVA, prior PE, HL, HTN.  He presents with a "vibration like feeling" in his left chest that somes and goes. No specific triggers, lasts a few seconds to minutes. No other associated symptoms. He specifically denies any chest pain or pressure, no SOB or DOE.    WBC 5 Hgb 15.6 Plt 152 K 3.6 Cr 1.35 Trop 0.07-->0.07 (3.5 hrs apart) CXR no acute process EKG SR, chronic nonspecific ST/T changes 05/2017 nculear stress apical anterseptasl scar. Moderate inferolateral ischemia 08/2017 cath : LAD and LCX occluded, severe RCA stenosis. Patent LIMA-LAD, SVG-diag, sequential SVG to OM1 and OM2. Received DES to RCA.  08/2017 echo LVEF 35-40%, apical hypokinesis     Past Medical History:  Diagnosis Date  . Apical mural thrombus   . Arteriosclerotic cardiovascular disease (ASCVD) 1998   Inferior MI->PCI of the RCA in 1998; acute anterior MI in 06/2002->  PCI of RCA and LAD with DES x2 to RCA, residual 80% ostial D1, and 80% mid CX and EF-40%; CABG-2011, LIMA-LAD, SVG to D1, OM1 & OM2; EF of 35-40% in 07/2010  . Chronic anticoagulation   . Chronic systolic CHF (congestive heart failure) ([place])   . CVA (cerebral vascular accident) ([place])   . HTN (hypertension)   . Hyperlipidemia   . Keloid    median sternotomy  . Pulmonary embolism ([place]) 03/2010  . 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 COLONOSCOPY N/A 01/22/2017   Procedure: COLONOSCOPY;  Surgeon: Danie Binder, MD;  [facility];  Service: Endoscopy;  Laterality: N/A;  200  . CORONARY ARTERY BYPASS GRAFT  03/18/2010   LIMA-LAD, SVG to diagonal, OM1 & OM2  . CORONARY STENT INTERVENTION N/A 08/21/2017   Procedure: CORONARY STENT INTERVENTION;  Surgeon: [name], MD;  Location: [place] [facility];  Service: Cardiovascular;  Laterality: N/A;  . LEFT HEART CATH AND CORS/GRAFTS ANGIOGRAPHY N/A 08/21/2017   Procedure: LEFT HEART CATH AND CORS/GRAFTS ANGIOGRAPHY;  Surgeon: [name], MD;  Location: [place] [facility];  Service: Cardiovascular;  Laterality: N/A;  . POLYPECTOMY  01/22/2017   Procedure: POLYPECTOMY;  Surgeon: Danie Binder, MD;  [facility];  Service: Endoscopy;;  Transverse(CS) and sigmoid colon(HS)  . PTCA  06/1996   LAD & RCA  . TEE WITHOUT CARDIOVERSION N/A 06/10/2012   Procedure: TRANSESOPHAGEAL ECHOCARDIOGRAM (TEE);  Surgeon: Josue Hector, MD;  [facility];  Service: Cardiovascular;  Laterality: N/A;     Inpatient Medications: Scheduled Meds:  Continuous Infusions:  PRN Meds:   Allergies:    Allergies  Allergen Reactions  . Lisinopril Swelling and Other (See Comments)    Mouth and tongue swells    Social History:   Social History   Socioeconomic History  . Marital status: Widowed    Spouse name: Not on file  .  Number of children: Not on file  . Years of education: Not on file  . Highest education level: Not on file  Occupational History  . Occupation: disability  Social Needs  . Financial resource strain: Not on file  . Food insecurity:    Worry: Not on file    Inability: Not on file  . Transportation needs:    Medical: Not on file    Non-medical: Not on file  Tobacco Use  . Smoking status: Former Smoker    Last attempt to quit: 2004    Years since quitting: 15.9  . Smokeless tobacco: Never  Used  Substance and Sexual Activity  . Alcohol use: Yes    Comment: rare  . Drug use: No    Types: Cocaine    Comment: Former cocaine abuse - last use for 51th birthday  . Sexual activity: Not on file  Lifestyle  . Physical activity:    Days per week: Not on file    Minutes per session: Not on file  . Stress: Not on file  Relationships  . Social connections:    Talks on phone: Not on file    Gets together: Not on file    Attends religious service: Not on file    Active member of club or organization: Not on file    Attends meetings of clubs or organizations: Not on file    Relationship status: Not on file  . Intimate partner violence:    Fear of current or ex partner: Not on file    Emotionally abused: Not on file    Physically abused: Not on file    Forced sexual activity: Not on file  Other Topics Concern  . Not on file  Social History Narrative  . Not on file    Family History:    Family History  Problem Relation Age of Onset  . Cancer Mother 45     ROS:  Please see the history of present illness.  All other ROS reviewed and negative.     Physical Exam/Data:   Vitals:   03/25/18 1045 03/25/18 1100 03/25/18 1115 03/25/18 1130  BP:  (!) 146/83  136/73  Pulse: 63 (!) 50 (!) 58 (!) 55  Resp: 14 17 14 11   Temp:      TempSrc:      SpO2: 95% 98% 100% 97%  Weight:      Height:       No intake or output data in the 24 hours ending 03/25/18 1443 Filed Weights   03/25/18 0817  Weight: 101.6 kg   Body mass index is 36.15 kg/m.  General:  Well nourished, well developed, in no acute distress HEENT: normal Lymph: no adenopathy Neck: no JVD Endocrine:  No thryomegaly Vascular: No carotid bruits; FA pulses 2+ bilaterally without bruits  Cardiac:  normal S1, S2; RRR; no murmur  Lungs:  clear to auscultation bilaterally, no wheezing, rhonchi or rales  Abd: soft, nontender, no hepatomegaly  Ext: no edema Musculoskeletal:  No deformities, BUE and BLE strength  normal and equal Skin: warm and dry  Neuro:  CNs 2-12 intact, no focal abnormalities noted Psych:  Normal affect    Laboratory Data:  Chemistry Recent Labs  Lab 03/25/18 0830  NA 139  K 3.6  CL 106  CO2 28  GLUCOSE 159*  BUN 12  CREATININE 1.35*  CALCIUM 8.8*  GFRNONAA 52*  GFRAA >60  ANIONGAP 5    No results for input(s): PROT, ALBUMIN, AST,  ALT, ALKPHOS, BILITOT in the last 168 hours. Hematology Recent Labs  Lab 03/25/18 0830  WBC 5.0  RBC 5.53  HGB 15.6  HCT 50.8  MCV 91.9  MCH 28.2  MCHC 30.7  RDW 13.8  PLT 152   Cardiac Enzymes Recent Labs  Lab 03/25/18 0830 03/25/18 1259  TROPONINI 0.07* 0.07*   No results for input(s): TROPIPOC in the last 168 hours.  BNPNo results for input(s): BNP, PROBNP in the last 168 hours.  DDimer No results for input(s): DDIMER in the last 168 hours.  Radiology/Studies:  Dg Chest 2 View  Result Date: 03/25/2018 CLINICAL DATA:  Chest pain since last night, feels like a buzzing in the chest, history atherosclerotic disease, pulmonary embolism, chronic systolic CHF, stroke, hypertension EXAM: CHEST - 2 VIEW COMPARISON:  09/23/2017 FINDINGS: Enlargement of cardiac silhouette post CABG. Mediastinal contours and pulmonary vascularity normal. Atherosclerotic calcification aorta. Lungs clear. No acute infiltrate, pleural effusion or pneumothorax. Bones appear demineralized. IMPRESSION: Enlargement of cardiac silhouette post CABG. No acute abnormalities. Electronically Signed   By: Lavonia Dana M.D.   On: 03/25/2018 09:11    Assessment and Plan:   1. Palpitations - episodes of "vibrations" in his left chest, he specifically denies any chest pain. Symptoms most consistent with episodic palpitations - tele here shows some PACs, at times up to 4 beats at a time. Rare PVCs, one couplet noted. No sustained arrhythmias. Does have some sinus brady to low 40s at times.  - no heavy caffeiene or EtoH use.  - chronically elevated trop  unchanged, EKG unchanged. Do not suspect ischemia  - would plan for a 1 week event monitor to better associate a possible arrhythmia with his symptoms. He sees his regular cardiolgist tomorrow so I will defer to him if he agrees to arrange monitor - would not increase beta blocker at this time due to some bradycardia to high 40s - TSH and Mg added on to ER labs, f/u results at appt tomorrow.   - ok for discharge from ER     For questions or updates, please contact [place] Please consult [email or web address] for contact info under     Signed, Carlyle Dolly, MD  03/25/2018 2:43 PM

## 2018-03-25 NOTE — ED Notes (Signed)
Date and time results received: 03/25/18 0925 (use smartphrase ".now" to insert current time)  Test: trop Critical Value: 0.07   Name of Provider Notified: cook  Orders Received? Or Actions Taken?: see chart

## 2018-03-25 NOTE — ED Triage Notes (Signed)
Patient complains of chest pain that started last night. Patient states the pain feels like a buzzing in his chest. Patient states he has history of open heart surgery with bypass with valve replacement.

## 2018-03-26 ENCOUNTER — Encounter: Payer: Self-pay | Admitting: Cardiovascular Disease

## 2018-03-26 ENCOUNTER — Ambulatory Visit (INDEPENDENT_AMBULATORY_CARE_PROVIDER_SITE_OTHER): Payer: Medicare Other | Admitting: Cardiovascular Disease

## 2018-03-26 VITALS — BP 150/80 | HR 80 | Ht 66.0 in | Wt 225.0 lb

## 2018-03-26 DIAGNOSIS — I25708 Atherosclerosis of coronary artery bypass graft(s), unspecified, with other forms of angina pectoris: Secondary | ICD-10-CM | POA: Diagnosis not present

## 2018-03-26 DIAGNOSIS — Z8673 Personal history of transient ischemic attack (TIA), and cerebral infarction without residual deficits: Secondary | ICD-10-CM | POA: Diagnosis not present

## 2018-03-26 DIAGNOSIS — Z955 Presence of coronary angioplasty implant and graft: Secondary | ICD-10-CM

## 2018-03-26 DIAGNOSIS — I429 Cardiomyopathy, unspecified: Secondary | ICD-10-CM | POA: Diagnosis not present

## 2018-03-26 DIAGNOSIS — G4733 Obstructive sleep apnea (adult) (pediatric): Secondary | ICD-10-CM

## 2018-03-26 DIAGNOSIS — I513 Intracardiac thrombosis, not elsewhere classified: Secondary | ICD-10-CM | POA: Diagnosis not present

## 2018-03-26 DIAGNOSIS — R002 Palpitations: Secondary | ICD-10-CM | POA: Diagnosis not present

## 2018-03-26 DIAGNOSIS — I5022 Chronic systolic (congestive) heart failure: Secondary | ICD-10-CM

## 2018-03-26 DIAGNOSIS — E785 Hyperlipidemia, unspecified: Secondary | ICD-10-CM | POA: Diagnosis not present

## 2018-03-26 DIAGNOSIS — Z79899 Other long term (current) drug therapy: Secondary | ICD-10-CM

## 2018-03-26 DIAGNOSIS — I25768 Atherosclerosis of bypass graft of coronary artery of transplanted heart with other forms of angina pectoris: Secondary | ICD-10-CM | POA: Diagnosis not present

## 2018-03-26 DIAGNOSIS — I10 Essential (primary) hypertension: Secondary | ICD-10-CM

## 2018-03-26 MED ORDER — POTASSIUM CHLORIDE CRYS ER 20 MEQ PO TBCR: 20.0000 meq | EXTENDED_RELEASE_TABLET | Freq: Every day | ORAL | 3 refills | Status: DC

## 2018-03-26 NOTE — ED Provider Notes (Signed)
[name] EMERGENCY DEPARTMENT Provider Note   CSN: [id number] Arrival date & time: 03/25/18  4742     History   Chief Complaint Chief Complaint  Patient presents with  . Chest Pain    HPI [name] is a 72 y.o. male.  Intermittent fluttering in the left chest for 2 days described as a "vibration".  Past medical history includes CABG and stenting.  No diaphoresis, nausea, dyspnea.  Symptoms are not associate with any activity.  Nitroglycerin did not help.     Past Medical History:  Diagnosis Date  . Apical mural thrombus   . Arteriosclerotic cardiovascular disease (ASCVD) 1998   Inferior MI->PCI of the RCA in 1998; acute anterior MI in 06/2002->  PCI of RCA and LAD with DES x2 to RCA, residual 80% ostial D1, and 80% mid CX and EF-40%; CABG-2011, LIMA-LAD, SVG to D1, OM1 & OM2; EF of 35-40% in 07/2010  . Chronic anticoagulation   . Chronic systolic CHF (congestive heart failure) ([place])   . CVA (cerebral vascular accident) ([place])   . HTN (hypertension)   . Hyperlipidemia   . Keloid    median sternotomy  . Pulmonary embolism ([place]) 03/2010  . 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 CHF (congestive heart failure) ([place]) 09/24/2017  . Chronic systolic heart failure ([place]) 08/21/2017  . Abnormal nuclear stress test   . CHF exacerbation ([place]) 03/20/2017  . Cerebral thrombosis with cerebral infarction 03/08/2017  . TIA (transient ischemic attack) 03/08/2017  . Visual field defect of right eye 03/07/2017  . CKD (chronic kidney disease), stage III ([place]) 03/07/2017  . Hyperglycemia 03/07/2017  . OSA (obstructive sleep apnea) 03/07/2017  . Special screening for malignant neoplasms, colon   . 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 Chronic anticoagulation-discontinued 10/19/2012  . Cerebral infarction ([place]) 06/09/2012  . Arteriosclerotic cardiovascular disease (ASCVD)   . Pulmonary embolism ([place]) 03/07/2010  .  Hyperlipidemia 11/24/2008  . Essential hypertension 11/24/2008    Past Surgical History:  Procedure Laterality Date  . COLONOSCOPY N/A 01/22/2017   Procedure: COLONOSCOPY;  Surgeon: Danie Binder, MD;  [facility];  Service: Endoscopy;  Laterality: N/A;  200  . CORONARY ARTERY BYPASS GRAFT  03/18/2010   LIMA-LAD, SVG to diagonal, OM1 & OM2  . CORONARY STENT INTERVENTION N/A 08/21/2017   Procedure: CORONARY STENT INTERVENTION;  Surgeon: [name], MD;  Location: [place] [facility];  Service: Cardiovascular;  Laterality: N/A;  . LEFT HEART CATH AND CORS/GRAFTS ANGIOGRAPHY N/A 08/21/2017   Procedure: LEFT HEART CATH AND CORS/GRAFTS ANGIOGRAPHY;  Surgeon: [name], MD;  Location: [place] [facility];  Service: Cardiovascular;  Laterality: N/A;  . POLYPECTOMY  01/22/2017   Procedure: POLYPECTOMY;  Surgeon: Danie Binder, MD;  [facility];  Service: Endoscopy;;  Transverse(CS) and sigmoid colon(HS)  . PTCA  06/1996   LAD & RCA  . TEE WITHOUT CARDIOVERSION N/A 06/10/2012   Procedure: TRANSESOPHAGEAL ECHOCARDIOGRAM (TEE);  Surgeon: Josue Hector, MD;  [facility];  Service: Cardiovascular;  Laterality: N/A;        Home Medications    Prior to Admission medications   Medication Sig Start Date End Date Taking? Authorizing Provider  aspirin EC 81 MG tablet Take 81 mg by mouth daily.   Yes [provider]  atorvastatin (LIPITOR) 40 MG tablet Take 1 tablet (40 mg total) by mouth daily at  6 PM. 03/09/17  Yes Debbe Odea, MD  BIDIL 20-37.5 MG tablet TAKE 1 TABLET BY MOUTH THREE TIMES A DAY 02/02/18  Yes Herminio Commons, MD  cycloSPORINE (RESTASIS) 0.05 % ophthalmic emulsion Place 1 drop into both eyes daily as needed (dry eye).    Yes [provider]  fluticasone (FLONASE) 50 MCG/ACT nasal spray Place 2 sprays into both nostrils 2 (two) times daily as needed for allergies or rhinitis.   Yes [provider]  furosemide  (LASIX) 40 MG tablet Take 1 tablet (40 mg total) by mouth daily. Patient taking differently: Take 20 mg by mouth daily.  09/24/17  Yes Kathie Dike, MD  metoprolol succinate (TOPROL-XL) 50 MG 24 hr tablet Take 50 mg by mouth daily. 04/27/17  Yes [provider]  nitroGLYCERIN (NITROSTAT) 0.4 MG SL tablet Place 1 tablet (0.4 mg total) under the tongue every 5 (five) minutes as needed for chest pain. up to 3 doses. 07/05/13  Yes Lendon Colonel, NP  apixaban (ELIQUIS) 5 MG TABS tablet Take 1 tablet (5 mg total) by mouth 2 (two) times daily. Restart 08/22/17. 08/21/17   Isaiah Serge, NP  clotrimazole-betamethasone (LOTRISONE) cream Apply 1 application topically daily as needed (for rah-irritation).    [provider]  potassium chloride SA (K-DUR,KLOR-CON) 20 MEQ tablet Take 1 tablet (20 mEq total) by mouth daily. 03/26/18   Herminio Commons, MD    Family History Family History  Problem Relation Age of Onset  . Cancer Mother 72    Social History Social History   Tobacco Use  . Smoking status: Former Smoker    Last attempt to quit: 2004    Years since quitting: 15.9  . Smokeless tobacco: Never Used  Substance Use Topics  . Alcohol use: Yes    Comment: rare  . Drug use: No    Types: Cocaine    Comment: Former cocaine abuse - last use for 51th birthday     Allergies   Lisinopril   Review of Systems Review of Systems  All other systems reviewed and are negative.    Physical Exam Updated Vital Signs BP (!) 146/82   Pulse (!) 55   Temp 98.7 F (37.1 C) (Oral)   Resp 14   Ht 5\' 6"  (1.676 m)   Wt 101.6 kg   SpO2 93%   BMI 36.15 kg/m   Physical Exam Vitals signs and nursing note reviewed.  Constitutional:      Appearance: He is well-developed.  HENT:     Head: Normocephalic and atraumatic.  Eyes:     Conjunctiva/sclera: Conjunctivae normal.  Neck:     Musculoskeletal: Neck supple.  Cardiovascular:     Rate and Rhythm: Normal rate and  regular rhythm.  Pulmonary:     Effort: Pulmonary effort is normal.     Breath sounds: Normal breath sounds.  Abdominal:     General: Bowel sounds are normal.     Palpations: Abdomen is soft.  Musculoskeletal: Normal range of motion.  Skin:    General: Skin is warm and dry.  Neurological:     Mental Status: He is alert and oriented to person, place, and time.  Psychiatric:        Behavior: Behavior normal.      ED Treatments / Results  Labs (all labs ordered are listed, but only abnormal results are displayed) Labs Reviewed  BASIC METABOLIC PANEL - Abnormal; Notable for the following components:      Result  Value   Glucose, Bld 159 (*)    Creatinine, Ser 1.35 (*)    Calcium 8.8 (*)    GFR calc non Af Amer 52 (*)    All other components within normal limits  TROPONIN I - Abnormal; Notable for the following components:   Troponin I 0.07 (*)    All other components within normal limits  TROPONIN I - Abnormal; Notable for the following components:   Troponin I 0.07 (*)    All other components within normal limits  CBC  TSH  MAGNESIUM    EKG EKG Interpretation  Date/Time:  Thursday March 25 2018 08:18:52 EST Ventricular Rate:  64 PR Interval:    QRS Duration: 135 QT Interval:  459 QTC Calculation: 474 R Axis:   69 Text Interpretation:  Sinus rhythm Borderline short PR interval Probable left atrial enlargement IVCD, consider atypical RBBB Inferior infarct, old Anterolateral infarct, age indeterminate Confirmed by Nat Christen [phone]) on 03/25/2018 8:27:35 AM   Radiology Dg Chest 2 View  Result Date: 03/25/2018 CLINICAL DATA:  Chest pain since last night, feels like a buzzing in the chest, history atherosclerotic disease, pulmonary embolism, chronic systolic CHF, stroke, hypertension EXAM: CHEST - 2 VIEW COMPARISON:  09/23/2017 FINDINGS: Enlargement of cardiac silhouette post CABG. Mediastinal contours and pulmonary vascularity normal. Atherosclerotic calcification  aorta. Lungs clear. No acute infiltrate, pleural effusion or pneumothorax. Bones appear demineralized. IMPRESSION: Enlargement of cardiac silhouette post CABG. No acute abnormalities. Electronically Signed   By: Lavonia Dana M.D.   On: 03/25/2018 09:11    Procedures Procedures (including critical care time)  Medications Ordered in ED Medications  nitroGLYCERIN (NITROSTAT) SL tablet 0.4 mg (0.4 mg Sublingual Given 03/25/18 1012)  aspirin tablet 325 mg (325 mg Oral Given 03/25/18 1012)     Initial Impression / Assessment and Plan / ED Course  I have reviewed the triage vital signs and the nursing notes.  Pertinent labs & imaging results that were available during my care of the patient were reviewed by me and considered in my medical decision making (see chart for details).     Patient presents with a vibration sensation in his left chest.  EKG shows no acute changes.  Delta troponin 0.07.  His troponin has been chronically elevated.  Discussed with cardiologist [name].  Patient will be discharged and recheck tomorrow.  Final Clinical Impressions(s) / ED Diagnoses   Final diagnoses:  Chest pain, unspecified type    ED Discharge Orders    None       Nat Christen, MD 03/26/18 1344

## 2018-03-26 NOTE — Progress Notes (Signed)
SUBJECTIVE: The patient presents for routine follow-up.  He was evaluated by [name] in the ER yesterday for palpitations.  Telemetry demonstrated PACs with up to 4 beats at a time.  There were rare PVCs and one couplet noted.  There were no sustained arrhythmias.  There was also evidence for sinus bradycardia to the low 40 bpm range at times.  He denied heavy caffeine and alcohol use.  He has chronically elevated troponins which appear to be unchanged.  It was recommended he wear a 1 week event monitor to better associate a possible arrhythmia with his symptoms.  Labs showed magnesium 1.9, TSH 1.9, phone 0.072, potassium 3.6, creatinine 1.35, and normal CBC.  Chest x-ray showed no acute abnormalities.  He underwent drug-eluting stent placement to the right coronary artery on 08/21/2017.  Cardiac catheterization demonstrated continued patency of the LIMA to LAD, SVG to diagonal, and sequential SVG to OM1 and OM 2.  Echocardiogram on 08/11/2017 showed moderately reduced left ventricular systolic function, LVEF 35 to 40%, mild LVH, near resolution of prior apical thrombus with a small remnant remaining.  He currently denies chest pain and palpitations.  He has occasional shortness of breath.  Overall he said he feels well.  Upon further discussion, it appears he has obstructive sleep apnea but stopped using CPAP about a year ago because it was cumbersome.  Review of Systems: As per "subjective", otherwise negative.  Allergies  Allergen Reactions  . Lisinopril Swelling and Other (See Comments)    Mouth and tongue swells    Current Outpatient Medications  Medication Sig Dispense Refill  . apixaban (ELIQUIS) 5 MG TABS tablet Take 1 tablet (5 mg total) by mouth 2 (two) times daily. Restart 08/22/17. 60 tablet 0  . aspirin EC 81 MG tablet Take 81 mg by mouth daily.    Marland Kitchen atorvastatin (LIPITOR) 40 MG tablet Take 1 tablet (40 mg total) by mouth daily at 6 PM. 30 tablet 0  . BIDIL 20-37.5  MG tablet TAKE 1 TABLET BY MOUTH THREE TIMES A DAY 90 tablet 6  . clotrimazole-betamethasone (LOTRISONE) cream Apply 1 application topically daily as needed (for rah-irritation).    . cycloSPORINE (RESTASIS) 0.05 % ophthalmic emulsion Place 1 drop into both eyes daily as needed (dry eye).     . fluticasone (FLONASE) 50 MCG/ACT nasal spray Place 2 sprays into both nostrils 2 (two) times daily as needed for allergies or rhinitis.    . furosemide (LASIX) 40 MG tablet Take 1 tablet (40 mg total) by mouth daily. (Patient taking differently: Take 20 mg by mouth daily. ) 30 tablet 0  . metoprolol succinate (TOPROL-XL) 50 MG 24 hr tablet Take 50 mg by mouth daily.  3  . nitroGLYCERIN (NITROSTAT) 0.4 MG SL tablet Place 1 tablet (0.4 mg total) under the tongue every 5 (five) minutes as needed for chest pain. up to 3 doses. 25 tablet 4   No current facility-administered medications for this visit.     Past Medical History:  Diagnosis Date  . Apical mural thrombus   . Arteriosclerotic cardiovascular disease (ASCVD) 1998   Inferior MI->PCI of the RCA in 1998; acute anterior MI in 06/2002->  PCI of RCA and LAD with DES x2 to RCA, residual 80% ostial D1, and 80% mid CX and EF-40%; CABG-2011, LIMA-LAD, SVG to D1, OM1 & OM2; EF of 35-40% in 07/2010  . Chronic anticoagulation   . Chronic systolic CHF (congestive heart failure) ([place])   . CVA (  cerebral vascular accident) ([place])   . HTN (hypertension)   . Hyperlipidemia   . Keloid    median sternotomy  . Pulmonary embolism ([place]) 03/2010  . 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 COLONOSCOPY N/A 01/22/2017   Procedure: COLONOSCOPY;  Surgeon: Danie Binder, MD;  [facility];  Service: Endoscopy;  Laterality: N/A;  200  . CORONARY ARTERY BYPASS GRAFT  03/18/2010   LIMA-LAD, SVG to diagonal, OM1 & OM2  . CORONARY STENT INTERVENTION N/A 08/21/2017   Procedure: CORONARY STENT INTERVENTION;   Surgeon: [name], MD;  Location: [place] CV LAB;  Service: Cardiovascular;  Laterality: N/A;  . LEFT HEART CATH AND CORS/GRAFTS ANGIOGRAPHY N/A 08/21/2017   Procedure: LEFT HEART CATH AND CORS/GRAFTS ANGIOGRAPHY;  Surgeon: [name], MD;  Location: [place] CV LAB;  Service: Cardiovascular;  Laterality: N/A;  . POLYPECTOMY  01/22/2017   Procedure: POLYPECTOMY;  Surgeon: Danie Binder, MD;  [facility];  Service: Endoscopy;;  Transverse(CS) and sigmoid colon(HS)  . PTCA  06/1996   LAD & RCA  . TEE WITHOUT CARDIOVERSION N/A 06/10/2012   Procedure: TRANSESOPHAGEAL ECHOCARDIOGRAM (TEE);  Surgeon: Josue Hector, MD;  [facility];  Service: Cardiovascular;  Laterality: N/A;    Social History   Socioeconomic History  . Marital status: Widowed    Spouse name: Not on file  . Number of children: Not on file  . Years of education: Not on file  . Highest education level: Not on file  Occupational History  . Occupation: disability  Social Needs  . Financial resource strain: Not on file  . Food insecurity:    Worry: Not on file    Inability: Not on file  . Transportation needs:    Medical: Not on file    Non-medical: Not on file  Tobacco Use  . Smoking status: Former Smoker    Last attempt to quit: 2004    Years since quitting: 15.9  . Smokeless tobacco: Never Used  Substance and Sexual Activity  . Alcohol use: Yes    Comment: rare  . Drug use: No    Types: Cocaine    Comment: Former cocaine abuse - last use for 51th birthday  . Sexual activity: Not on file  Lifestyle  . Physical activity:    Days per week: Not on file    Minutes per session: Not on file  . Stress: Not on file  Relationships  . Social connections:    Talks on phone: Not on file    Gets together: Not on file    Attends religious service: Not on file    Active member of club or organization: Not on file    Attends meetings of clubs or organizations: Not on file     Relationship status: Not on file  . Intimate partner violence:    Fear of current or ex partner: Not on file    Emotionally abused: Not on file    Physically abused: Not on file    Forced sexual activity: Not on file  Other Topics Concern  . Not on file  Social History Narrative  . Not on file     Vitals:   03/26/18 1100  BP: (!) 150/80  Pulse: 80  SpO2: 96%  Weight: 225 lb (102.1 kg)  Height: 5\' 6"  (1.676 m)    Wt Readings from Last 3 Encounters:  03/26/18  225 lb (102.1 kg)  03/25/18 224 lb (101.6 kg)  11/04/17 221 lb 9.6 oz (100.5 kg)     PHYSICAL EXAM General: NAD HEENT: Normal. Neck: No JVD, no thyromegaly. Lungs: Clear to auscultation bilaterally with normal respiratory effort. CV: Regular rate and rhythm, normal S1/S2, no S3/S4, no murmur. No pretibial or periankle edema.  No carotid bruit.   Abdomen: Soft, nontender, no distention.  Neurologic: Alert and oriented.  Psych: Normal affect. Skin: Normal. Musculoskeletal: No gross deformities.    ECG: Reviewed above under Subjective   Labs: Lab Results  Component Value Date/Time   K 3.6 03/25/2018 08:30 AM   BUN 12 03/25/2018 08:30 AM   CREATININE 1.35 (H) 03/25/2018 08:30 AM   ALT 39 03/20/2017 06:00 AM   TSH 1.930 03/25/2018 08:30 AM   TSH 1.043 06/07/2012 09:58 AM   HGB 15.6 03/25/2018 08:30 AM     Lipids: Lab Results  Component Value Date/Time   LDLCALC 58 03/09/2017 04:58 AM   CHOL 116 03/09/2017 04:58 AM   TRIG 76 03/09/2017 04:58 AM   HDL 43 03/09/2017 04:58 AM       ASSESSMENT AND PLAN: 1. CAD with CABG status post drug-eluting stent placement to the SWF:UXNATF ischemic heart disease. Continue metoprolol succinate andatorvastatin 40 mg. Allergic to ACE inhibitors/angiotensin receptor blockers.  Continue aspirin 81 mg daily.  2. Hyperlipidemia:I will obtain a copy of most recent lipids. Continue Lipitor 40 mg.  3. H/o TDD:UKGURKYHC on Eliquis 5 mg twice daily.  4.  Essential WCB:JSEGB pressure is  elevated.  I will monitor. Allergic to ACE inhibitors/angiotensin receptor blockers. ContinueBiDil 20/37.5 mg 3 times daily.  5. Chronic systolic heart failure, LVEF 35 to 40%: Currently on Lasix 40 mg daily and Toprol-XL 50 mg daily. Allergic to ACE inhibitors/angiotensin receptor blockers.  I will add potassium chloride 20 mEq daily and check a BMP in 1 week. Blood pressure is  elevated today. I willcontinueBiDil 20/37.5 mg 3 times daily.   6.Apical thrombus: Currently on Eliquis 5 mg twice daily(also with a history of cerebral thrombosis).  Echocardiogram in May showed near resolution of apical thrombus with a small remnant remaining as detailed above.  7.  Palpitations: Telemetry demonstrated PACs with up to 4 beats at a time.  There were rare PVCs and one couplet noted.  There were no sustained arrhythmias.  There was also evidence for sinus bradycardia to the low 40 bpm range at times.  Heart rate is 80 bpm today.  I will obtain a 1 week event monitor.  I will add potassium chloride 20 mEq daily and check a BMP in 1 week.  I suspect this may be triggered by untreated obstructive sleep apnea.  8.  Obstructive sleep apnea: Not using CPAP.  I encouraged compliance.  I suspect this is leading to arrhythmias.   Disposition: Follow up 4 months   Kate Sable, M.D., F.A.C.C.

## 2018-03-26 NOTE — Patient Instructions (Signed)
Medication Instructions:  START Potassium 20 meq daily  If you need a refill on your cardiac medications before your next appointment, please call your pharmacy.   Lab work: Probation officer in 1 week  04/02/18 If you have labs (blood work) drawn today and your tests are completely normal, you will receive your results only by: Marland Kitchen MyChart Message (if you have MyChart) OR . A paper copy in the mail If you have any lab test that is abnormal or we need to change your treatment, we will call you to review the results.  Testing/Procedures: Your physician has recommended that you wear an event monitor for 1 week . Event monitors are medical devices that record the heart's electrical activity. Doctors most often [place] these monitors to diagnose arrhythmias. Arrhythmias are problems with the speed or rhythm of the heartbeat. The monitor is a small, portable device. You can wear one while you do your normal daily activities. This is usually used to diagnose what is causing palpitations/syncope (passing out).    Follow-Up: At [facility], you and your health needs are our priority.  As part of our continuing mission to provide you with exceptional heart care, we have created designated Provider Care Teams.  These Care Teams include your primary Cardiologist (physician) and Advanced Practice Providers (APPs -  Physician Assistants and Nurse Practitioners) who all work together to provide you with the care you need, when you need it. You will need a follow up appointment in 4 months.  Please call our office 2 months in advance to schedule this appointment.  You may see Kate Sable, MD or one of the following Advanced Practice Providers on your designated Care Team:   Bernerd Pho, PA-C [facility]) . Ermalinda Barrios, PA-C ([place])  Any Other Special Instructions Will Be Listed Below (If Applicable). None

## 2018-04-02 ENCOUNTER — Other Ambulatory Visit (HOSPITAL_COMMUNITY)
Admission: RE | Admit: 2018-04-02 | Discharge: 2018-04-02 | Disposition: A | Discharge status: Home / Self Care | Payer: Medicare Other | Source: Ambulatory Visit | Attending: Cardiovascular Disease | Admitting: Cardiovascular Disease

## 2018-04-02 DIAGNOSIS — Z79899 Other long term (current) drug therapy: Secondary | ICD-10-CM | POA: Diagnosis not present

## 2018-04-02 LAB — BASIC METABOLIC PANEL
Anion gap: 5 (ref 5–15)
BUN: 10 mg/dL (ref 8–23)
CO2: 28 mmol/L (ref 22–32)
Calcium: 8.5 mg/dL — ABNORMAL LOW (ref 8.9–10.3)
Chloride: 105 mmol/L (ref 98–111)
Creatinine, Ser: 1.44 mg/dL — ABNORMAL HIGH (ref 0.61–1.24)
GFR calc Af Amer: 56 mL/min — ABNORMAL LOW (ref >60)
GFR calc non Af Amer: 49 mL/min — ABNORMAL LOW (ref >60)
Glucose, Bld: 131 mg/dL — ABNORMAL HIGH (ref 70–99)
Potassium: 3.5 mmol/L (ref 3.5–5.1)
Sodium: 138 mmol/L (ref 135–145)

## 2018-04-19 ENCOUNTER — Telehealth: Payer: Self-pay | Admitting: *Deleted

## 2018-04-19 NOTE — Telephone Encounter (Signed)
Called pt to notify that heart monitor has to be redone. Spoke with Mitch from [employer]. States that monitor was returned with out any data.

## 2018-04-20 ENCOUNTER — Ambulatory Visit (INDEPENDENT_AMBULATORY_CARE_PROVIDER_SITE_OTHER): Payer: Medicare Other

## 2018-04-20 ENCOUNTER — Other Ambulatory Visit: Payer: Self-pay

## 2018-04-20 ENCOUNTER — Ambulatory Visit: Payer: Medicare Other

## 2018-04-20 ENCOUNTER — Telehealth: Payer: Self-pay | Admitting: Cardiovascular Disease

## 2018-04-20 DIAGNOSIS — R002 Palpitations: Secondary | ICD-10-CM

## 2018-04-20 NOTE — Telephone Encounter (Signed)
Monitor placed on pt. He will mail back in 1 week.

## 2018-04-20 NOTE — Telephone Encounter (Signed)
Pt did not get his monitor on after his visit on 03/26/18

## 2018-05-10 ENCOUNTER — Other Ambulatory Visit: Payer: Self-pay

## 2018-05-10 DIAGNOSIS — R002 Palpitations: Secondary | ICD-10-CM

## 2018-06-20 ENCOUNTER — Emergency Department (HOSPITAL_COMMUNITY): Payer: Medicare Other

## 2018-06-20 ENCOUNTER — Other Ambulatory Visit: Payer: Self-pay

## 2018-06-20 ENCOUNTER — Encounter (HOSPITAL_COMMUNITY): Payer: Self-pay | Admitting: *Deleted

## 2018-06-20 ENCOUNTER — Inpatient Hospital Stay (HOSPITAL_COMMUNITY)
Admission: EM | Admit: 2018-06-20 | Discharge: 2018-06-22 | DRG: 281 | Disposition: A | Discharge status: Home / Self Care | Payer: Medicare Other | Attending: Cardiology | Admitting: Cardiology

## 2018-06-20 DIAGNOSIS — G4733 Obstructive sleep apnea (adult) (pediatric): Secondary | ICD-10-CM | POA: Diagnosis present

## 2018-06-20 DIAGNOSIS — I2510 Atherosclerotic heart disease of native coronary artery without angina pectoris: Secondary | ICD-10-CM | POA: Diagnosis present

## 2018-06-20 DIAGNOSIS — Z87891 Personal history of nicotine dependence: Secondary | ICD-10-CM

## 2018-06-20 DIAGNOSIS — Z888 Allergy status to other drugs, medicaments and biological substances status: Secondary | ICD-10-CM

## 2018-06-20 DIAGNOSIS — I5022 Chronic systolic (congestive) heart failure: Secondary | ICD-10-CM | POA: Diagnosis present

## 2018-06-20 DIAGNOSIS — Z86010 Personal history of colonic polyps: Secondary | ICD-10-CM

## 2018-06-20 DIAGNOSIS — Z951 Presence of aortocoronary bypass graft: Secondary | ICD-10-CM

## 2018-06-20 DIAGNOSIS — T82855A Stenosis of coronary artery stent, initial encounter: Secondary | ICD-10-CM | POA: Diagnosis present

## 2018-06-20 DIAGNOSIS — L910 Hypertrophic scar: Secondary | ICD-10-CM | POA: Diagnosis present

## 2018-06-20 DIAGNOSIS — E785 Hyperlipidemia, unspecified: Secondary | ICD-10-CM | POA: Diagnosis present

## 2018-06-20 DIAGNOSIS — I2699 Other pulmonary embolism without acute cor pulmonale: Secondary | ICD-10-CM | POA: Diagnosis present

## 2018-06-20 DIAGNOSIS — E782 Mixed hyperlipidemia: Secondary | ICD-10-CM | POA: Diagnosis present

## 2018-06-20 DIAGNOSIS — N183 Chronic kidney disease, stage 3 unspecified: Secondary | ICD-10-CM | POA: Diagnosis present

## 2018-06-20 DIAGNOSIS — Z7982 Long term (current) use of aspirin: Secondary | ICD-10-CM

## 2018-06-20 DIAGNOSIS — I10 Essential (primary) hypertension: Secondary | ICD-10-CM | POA: Diagnosis present

## 2018-06-20 DIAGNOSIS — I472 Ventricular tachycardia: Secondary | ICD-10-CM | POA: Diagnosis present

## 2018-06-20 DIAGNOSIS — I214 Non-ST elevation (NSTEMI) myocardial infarction: Secondary | ICD-10-CM | POA: Diagnosis not present

## 2018-06-20 DIAGNOSIS — H9190 Unspecified hearing loss, unspecified ear: Secondary | ICD-10-CM | POA: Diagnosis present

## 2018-06-20 DIAGNOSIS — R079 Chest pain, unspecified: Secondary | ICD-10-CM | POA: Diagnosis not present

## 2018-06-20 DIAGNOSIS — I130 Hypertensive heart and chronic kidney disease with heart failure and stage 1 through stage 4 chronic kidney disease, or unspecified chronic kidney disease: Secondary | ICD-10-CM | POA: Diagnosis present

## 2018-06-20 DIAGNOSIS — Z79899 Other long term (current) drug therapy: Secondary | ICD-10-CM

## 2018-06-20 DIAGNOSIS — N1831 Chronic kidney disease, stage 3a: Secondary | ICD-10-CM | POA: Diagnosis present

## 2018-06-20 DIAGNOSIS — Y831 Surgical operation with implant of artificial internal device as the cause of abnormal reaction of the patient, or of later complication, without mention of misadventure at the time of the procedure: Secondary | ICD-10-CM | POA: Diagnosis present

## 2018-06-20 DIAGNOSIS — Z8673 Personal history of transient ischemic attack (TIA), and cerebral infarction without residual deficits: Secondary | ICD-10-CM

## 2018-06-20 DIAGNOSIS — I493 Ventricular premature depolarization: Secondary | ICD-10-CM | POA: Diagnosis present

## 2018-06-20 DIAGNOSIS — I252 Old myocardial infarction: Secondary | ICD-10-CM

## 2018-06-20 DIAGNOSIS — Z7901 Long term (current) use of anticoagulants: Secondary | ICD-10-CM

## 2018-06-20 DIAGNOSIS — Z86711 Personal history of pulmonary embolism: Secondary | ICD-10-CM

## 2018-06-20 LAB — BASIC METABOLIC PANEL
Anion gap: 7 (ref 5–15)
BUN: 16 mg/dL (ref 8–23)
CO2: 29 mmol/L (ref 22–32)
Calcium: 8.9 mg/dL (ref 8.9–10.3)
Chloride: 105 mmol/L (ref 98–111)
Creatinine, Ser: 1.39 mg/dL — ABNORMAL HIGH (ref 0.61–1.24)
GFR calc Af Amer: 58 mL/min — ABNORMAL LOW (ref >60)
GFR calc non Af Amer: 50 mL/min — ABNORMAL LOW (ref >60)
Glucose, Bld: 144 mg/dL — ABNORMAL HIGH (ref 70–99)
Potassium: 3.6 mmol/L (ref 3.5–5.1)
Sodium: 141 mmol/L (ref 135–145)

## 2018-06-20 LAB — CBC
HCT: 49.8 % (ref 39.0–52.0)
Hemoglobin: 15.4 g/dL (ref 13.0–17.0)
MCH: 28.7 pg (ref 26.0–34.0)
MCHC: 30.9 g/dL (ref 30.0–36.0)
MCV: 92.7 fL (ref 80.0–100.0)
Platelets: 145 10*3/uL — ABNORMAL LOW (ref 150–400)
RBC: 5.37 MIL/uL (ref 4.22–5.81)
RDW: 13.3 % (ref 11.5–15.5)
WBC: 6.4 10*3/uL (ref 4.0–10.5)
nRBC: 0 % (ref 0.0–0.2)

## 2018-06-20 LAB — TROPONIN I: Troponin I: 0.09 ng/mL (ref <0.03)

## 2018-06-20 MED ORDER — NITROGLYCERIN 0.4 MG SL SUBL
0.4000 mg | SUBLINGUAL_TABLET | Freq: Once | SUBLINGUAL | Status: AC
  Administered 2018-06-20: 0.4 mg via SUBLINGUAL
  Filled 2018-06-20: qty 1

## 2018-06-20 MED ORDER — SODIUM CHLORIDE 0.9% FLUSH: 3.0000 mL | Freq: Once | INTRAVENOUS | Status: DC

## 2018-06-20 NOTE — ED Triage Notes (Signed)
Pt c/o mid center chest pain that is described as a pain that started prior to arrival in er, pt denies any n/v, sob, denies any radiation, states that he was watching tv when pain started,

## 2018-06-20 NOTE — ED Notes (Signed)
CRITICAL VALUE ALERT  Critical Value:  Troponin 0.09  Date & Time Notied:  06/20/18 2251  Provider Notified: dr.pickering  Orders Received/Actions taken: md notified

## 2018-06-20 NOTE — ED Provider Notes (Signed)
[facility] EMERGENCY DEPARTMENT Provider Note   CSN: [id number] Arrival date & time: 06/20/18  2034    History   Chief Complaint Chief Complaint  Patient presents with   Chest Pain    HPI [name] is a 73 y.o. male.     HPI Patient presents with sharp anterior chest pain.  Began while getting up from watching TV.  States he was able to wash his car yesterday with no chest pain.  Pain is sharp.  Not worse with breathing and does not feel short of breath.  Does have previous coronary artery disease with previous bypass.  He is on Eliquis.  No swelling in his legs.  No cough.  No sick contacts.  No pain with exertion.  Pain is dull.  Has not taken any nitroglycerin.  Pain is sharp and does not radiate. Past Medical History:  Diagnosis Date   Apical mural thrombus    Arteriosclerotic cardiovascular disease (ASCVD) 1998   Inferior MI->PCI of the RCA in 1998; acute anterior MI in 06/2002->  PCI of RCA and LAD with DES x2 to RCA, residual 80% ostial D1, and 80% mid CX and EF-40%; CABG-2011, LIMA-LAD, SVG to D1, OM1 & OM2; EF of 35-40% in 07/2010   Chronic anticoagulation    Chronic systolic CHF (congestive heart failure) (H[facility])    CVA (cerebral vascular accident) ([place])    HTN (hypertension)    Hyperlipidemia    Keloid    median sternotomy   Pulmonary embolism ([place]) 03/2010   Substance abuse ([place])    formerly cocaine    Patient Active Problem List   Diagnosis Date Noted   CHF (congestive heart failure) ([place]) 44/31/5400   Chronic systolic heart failure ([place]) 08/21/2017   Abnormal nuclear stress test    CHF exacerbation ([place]) 03/20/2017   Cerebral thrombosis with cerebral infarction 03/08/2017   TIA (transient ischemic attack) 03/08/2017   Visual field defect of right eye 03/07/2017   CKD (chronic kidney disease), stage III ([place]) 03/07/2017   Hyperglycemia 03/07/2017   OSA (obstructive sleep apnea) 03/07/2017   Special screening for malignant  neoplasms, colon    Central sleep apnea 11/01/2012   Chronic anticoagulation-discontinued 10/19/2012   Cerebral infarction ([place]) 06/09/2012   Arteriosclerotic cardiovascular disease (ASCVD)    Pulmonary embolism ([place]) 03/07/2010   Hyperlipidemia 11/24/2008   Essential hypertension 11/24/2008    Past Surgical History:  Procedure Laterality Date   COLONOSCOPY N/A 01/22/2017   Procedure: COLONOSCOPY;  Surgeon: Danie Binder, MD;  [facility];  Service: Endoscopy;  Laterality: N/A;  200   CORONARY ARTERY BYPASS GRAFT  03/18/2010   LIMA-LAD, SVG to diagonal, OM1 & OM2   CORONARY STENT INTERVENTION N/A 08/21/2017   Procedure: CORONARY STENT INTERVENTION;  Surgeon: [name], MD;  Location: [place] CV LAB;  Service: Cardiovascular;  Laterality: N/A;   LEFT HEART CATH AND CORS/GRAFTS ANGIOGRAPHY N/A 08/21/2017   Procedure: LEFT HEART CATH AND CORS/GRAFTS ANGIOGRAPHY;  Surgeon: [name], MD;  Location: [place] CV LAB;  Service: Cardiovascular;  Laterality: N/A;   POLYPECTOMY  01/22/2017   Procedure: POLYPECTOMY;  Surgeon: Danie Binder, MD;  [facility];  Service: Endoscopy;;  Transverse(CS) and sigmoid colon(HS)   PTCA  06/1996   LAD & RCA   TEE WITHOUT CARDIOVERSION N/A 06/10/2012   Procedure: TRANSESOPHAGEAL ECHOCARDIOGRAM (TEE);  Surgeon: Josue Hector, MD;  [facility];  Service: Cardiovascular;  Laterality: N/A;  Home Medications    Prior to Admission medications   Medication Sig Start Date End Date Taking? Authorizing Provider  apixaban (ELIQUIS) 5 MG TABS tablet Take 1 tablet (5 mg total) by mouth 2 (two) times daily. Restart 08/22/17. 08/21/17   Isaiah Serge, NP  aspirin EC 81 MG tablet Take 81 mg by mouth daily.    [provider]  atorvastatin (LIPITOR) 40 MG tablet Take 1 tablet (40 mg total) by mouth daily at 6 PM. 03/09/17   Debbe Odea, MD  BIDIL 20-37.5 MG tablet TAKE 1 TABLET BY MOUTH  THREE TIMES A DAY 02/02/18   Herminio Commons, MD  clotrimazole-betamethasone (LOTRISONE) cream Apply 1 application topically daily as needed (for rah-irritation).    [provider]  cycloSPORINE (RESTASIS) 0.05 % ophthalmic emulsion Place 1 drop into both eyes daily as needed (dry eye).     [provider]  fluticasone (FLONASE) 50 MCG/ACT nasal spray Place 2 sprays into both nostrils 2 (two) times daily as needed for allergies or rhinitis.    [provider]  furosemide (LASIX) 40 MG tablet Take 1 tablet (40 mg total) by mouth daily. Patient taking differently: Take 20 mg by mouth daily.  09/24/17   Kathie Dike, MD  metoprolol succinate (TOPROL-XL) 50 MG 24 hr tablet Take 50 mg by mouth daily. 04/27/17   [provider]  nitroGLYCERIN (NITROSTAT) 0.4 MG SL tablet Place 1 tablet (0.4 mg total) under the tongue every 5 (five) minutes as needed for chest pain. up to 3 doses. 07/05/13   Lendon Colonel, NP  potassium chloride SA (K-DUR,KLOR-CON) 20 MEQ tablet Take 1 tablet (20 mEq total) by mouth daily. 03/26/18   Herminio Commons, MD    Family History Family History  Problem Relation Age of Onset   Cancer Mother 22    Social History Social History   Tobacco Use   Smoking status: Former Smoker    Last attempt to quit: 2004    Years since quitting: 16.2   Smokeless tobacco: Never Used  Substance Use Topics   Alcohol use: Yes    Comment: rare   Drug use: No    Types: Cocaine    Comment: Former cocaine abuse - last use for 51th birthday     Allergies   Lisinopril   Review of Systems Review of Systems  Constitutional: Negative for appetite change.  HENT: Negative for congestion.   Respiratory: Negative for shortness of breath.   Cardiovascular: Positive for chest pain. Negative for leg swelling.  Gastrointestinal: Negative for abdominal distention.  Genitourinary: Negative for flank pain.  Musculoskeletal: Negative for  back pain.  Skin: Negative for rash.  Neurological: Negative for weakness.  Psychiatric/Behavioral: Negative for confusion.     Physical Exam Updated Vital Signs BP (!) 149/74 (BP Location: Right Arm)    Pulse 67    Temp 98.2 F (36.8 C) (Oral)    Resp 16    Ht 5\' 6"  (1.676 m)    Wt 101.6 kg    SpO2 91%    BMI 36.15 kg/m   Physical Exam HENT:     Head: Normocephalic.  Neck:     Musculoskeletal: Neck supple.  Cardiovascular:     Rate and Rhythm: Normal rate and regular rhythm.     Heart sounds: Normal heart sounds.  Pulmonary:     Effort: Pulmonary effort is normal.     Breath sounds: Normal breath sounds.  Chest:     Comments:  Midline scar from previous median sternotomy. Abdominal:     Tenderness: There is no abdominal tenderness.  Musculoskeletal:     Right lower leg: No edema.     Left lower leg: No edema.  Skin:    General: Skin is warm.  Neurological:     Mental Status: He is alert.     Comments: Patient is awake and appropriate.      ED Treatments / Results  Labs (all labs ordered are listed, but only abnormal results are displayed) Labs Reviewed  BASIC METABOLIC PANEL - Abnormal; Notable for the following components:      Result Value   Glucose, Bld 144 (*)    Creatinine, Ser 1.39 (*)    GFR calc non Af Amer 50 (*)    GFR calc Af Amer 58 (*)    All other components within normal limits  CBC - Abnormal; Notable for the following components:   Platelets 145 (*)    All other components within normal limits  TROPONIN I - Abnormal; Notable for the following components:   Troponin I 0.09 (*)    All other components within normal limits    EKG EKG Interpretation  Date/Time:  Sunday June 20 2018 20:38:56 EDT Ventricular Rate:  68 PR Interval:  128 QRS Duration: 94 QT Interval:  434 QTC Calculation: 461 R Axis:   74 Text Interpretation:  Sinus rhythm with marked sinus arrhythmia Confirmed by Davonna Belling [phone]) on 06/20/2018 9:08:23  PM   Radiology Dg Chest 2 View  Result Date: 06/20/2018 CLINICAL DATA:  Chest pain EXAM: CHEST - 2 VIEW COMPARISON:  March 25, 2018 FINDINGS: There is no appreciable edema or consolidation. Heart is upper normal in size with pulmonary vascularity normal. No adenopathy. Patient is status post coronary artery bypass grafting. No pneumothorax. No bone lesions. IMPRESSION: Status post coronary artery bypass grafting. Heart upper normal in size. No edema or consolidation. Electronically Signed   By: Lowella Grip III M.D.   On: 06/20/2018 21:13    Procedures Procedures (including critical care time)  Medications Ordered in ED Medications  sodium chloride flush (NS) 0.9 % injection 3 mL (has no administration in time range)  nitroGLYCERIN (NITROSTAT) SL tablet 0.4 mg (0.4 mg Sublingual Given 06/20/18 2151)     Initial Impression / Assessment and Plan / ED Course  I have reviewed the triage vital signs and the nursing notes.  Pertinent labs & imaging results that were available during my care of the patient were reviewed by me and considered in my medical decision making (see chart for details).       Patient presents with chest pain.  Sharp.  Mid chest.  Not exertional.  Feels slightly like previous anginal pain.  Began at rest but has not had any pain with exertion recently.  Feeling somewhat better.  Has had a heart cath around a year ago.  Had a RCA stent done then and bypassed vessels were intact.  Troponin is mildly elevated here but appears to be close to baseline.  Pain improved.  Will recheck troponin and if stable hopefully will be able to discharge with cardiology follow-up.  Care turned over to Dr. Tomi Bamberger  Final Clinical Impressions(s) / ED Diagnoses   Final diagnoses:  Nonspecific chest pain    ED Discharge Orders    None       Davonna Belling, MD 06/21/18 [phone]

## 2018-06-21 ENCOUNTER — Encounter (HOSPITAL_COMMUNITY)
Admission: EM | Disposition: A | Discharge status: Home / Self Care | Payer: Self-pay | Source: Home / Self Care | Attending: Cardiology

## 2018-06-21 DIAGNOSIS — Z86711 Personal history of pulmonary embolism: Secondary | ICD-10-CM | POA: Diagnosis not present

## 2018-06-21 DIAGNOSIS — E785 Hyperlipidemia, unspecified: Secondary | ICD-10-CM | POA: Diagnosis present

## 2018-06-21 DIAGNOSIS — T82855A Stenosis of coronary artery stent, initial encounter: Secondary | ICD-10-CM | POA: Diagnosis present

## 2018-06-21 DIAGNOSIS — I214 Non-ST elevation (NSTEMI) myocardial infarction: Secondary | ICD-10-CM | POA: Diagnosis present

## 2018-06-21 DIAGNOSIS — I2510 Atherosclerotic heart disease of native coronary artery without angina pectoris: Secondary | ICD-10-CM

## 2018-06-21 DIAGNOSIS — I493 Ventricular premature depolarization: Secondary | ICD-10-CM | POA: Diagnosis present

## 2018-06-21 DIAGNOSIS — I472 Ventricular tachycardia: Secondary | ICD-10-CM | POA: Diagnosis present

## 2018-06-21 DIAGNOSIS — Y831 Surgical operation with implant of artificial internal device as the cause of abnormal reaction of the patient, or of later complication, without mention of misadventure at the time of the procedure: Secondary | ICD-10-CM | POA: Diagnosis present

## 2018-06-21 DIAGNOSIS — Z86010 Personal history of colonic polyps: Secondary | ICD-10-CM | POA: Diagnosis not present

## 2018-06-21 DIAGNOSIS — I252 Old myocardial infarction: Secondary | ICD-10-CM | POA: Diagnosis not present

## 2018-06-21 DIAGNOSIS — R079 Chest pain, unspecified: Secondary | ICD-10-CM | POA: Diagnosis present

## 2018-06-21 DIAGNOSIS — I130 Hypertensive heart and chronic kidney disease with heart failure and stage 1 through stage 4 chronic kidney disease, or unspecified chronic kidney disease: Secondary | ICD-10-CM | POA: Diagnosis present

## 2018-06-21 DIAGNOSIS — Z79899 Other long term (current) drug therapy: Secondary | ICD-10-CM | POA: Diagnosis not present

## 2018-06-21 DIAGNOSIS — Z951 Presence of aortocoronary bypass graft: Secondary | ICD-10-CM | POA: Diagnosis not present

## 2018-06-21 DIAGNOSIS — I5022 Chronic systolic (congestive) heart failure: Secondary | ICD-10-CM | POA: Diagnosis present

## 2018-06-21 DIAGNOSIS — Z7901 Long term (current) use of anticoagulants: Secondary | ICD-10-CM | POA: Diagnosis not present

## 2018-06-21 DIAGNOSIS — G4733 Obstructive sleep apnea (adult) (pediatric): Secondary | ICD-10-CM | POA: Diagnosis present

## 2018-06-21 DIAGNOSIS — Z8673 Personal history of transient ischemic attack (TIA), and cerebral infarction without residual deficits: Secondary | ICD-10-CM | POA: Diagnosis not present

## 2018-06-21 DIAGNOSIS — N183 Chronic kidney disease, stage 3 (moderate): Secondary | ICD-10-CM | POA: Diagnosis present

## 2018-06-21 DIAGNOSIS — L910 Hypertrophic scar: Secondary | ICD-10-CM | POA: Diagnosis present

## 2018-06-21 DIAGNOSIS — Z7982 Long term (current) use of aspirin: Secondary | ICD-10-CM | POA: Diagnosis not present

## 2018-06-21 DIAGNOSIS — H9190 Unspecified hearing loss, unspecified ear: Secondary | ICD-10-CM | POA: Diagnosis present

## 2018-06-21 DIAGNOSIS — Z888 Allergy status to other drugs, medicaments and biological substances status: Secondary | ICD-10-CM | POA: Diagnosis not present

## 2018-06-21 DIAGNOSIS — Z87891 Personal history of nicotine dependence: Secondary | ICD-10-CM | POA: Diagnosis not present

## 2018-06-21 HISTORY — PX: LEFT HEART CATH AND CORS/GRAFTS ANGIOGRAPHY: CATH118250

## 2018-06-21 LAB — BASIC METABOLIC PANEL
Anion gap: 8 (ref 5–15)
BUN: 11 mg/dL (ref 8–23)
CO2: 27 mmol/L (ref 22–32)
Calcium: 8.8 mg/dL — ABNORMAL LOW (ref 8.9–10.3)
Chloride: 104 mmol/L (ref 98–111)
Creatinine, Ser: 1.46 mg/dL — ABNORMAL HIGH (ref 0.61–1.24)
GFR calc Af Amer: 55 mL/min — ABNORMAL LOW (ref >60)
GFR calc non Af Amer: 47 mL/min — ABNORMAL LOW (ref >60)
Glucose, Bld: 109 mg/dL — ABNORMAL HIGH (ref 70–99)
Potassium: 3.8 mmol/L (ref 3.5–5.1)
Sodium: 139 mmol/L (ref 135–145)

## 2018-06-21 LAB — TROPONIN I
Troponin I: 3.2 ng/mL (ref <0.03)
Troponin I: 16.18 ng/mL (ref <0.03)

## 2018-06-21 SURGERY — LEFT HEART CATH AND CORS/GRAFTS ANGIOGRAPHY
Anesthesia: LOCAL

## 2018-06-21 MED ORDER — VERAPAMIL HCL 2.5 MG/ML IV SOLN
INTRAVENOUS | Status: AC
  Filled 2018-06-21: qty 2

## 2018-06-21 MED ORDER — SODIUM CHLORIDE 0.9% FLUSH
3.0000 mL | Freq: Two times a day (BID) | INTRAVENOUS | Status: DC
  Administered 2018-06-21: 3 mL via INTRAVENOUS

## 2018-06-21 MED ORDER — ASPIRIN EC 81 MG PO TBEC
81.0000 mg | DELAYED_RELEASE_TABLET | Freq: Every day | ORAL | Status: DC
  Administered 2018-06-21: 81 mg via ORAL
  Filled 2018-06-21: qty 1

## 2018-06-21 MED ORDER — SODIUM CHLORIDE 0.9 % IV SOLN: INTRAVENOUS | Status: AC

## 2018-06-21 MED ORDER — IOHEXOL 350 MG/ML SOLN
INTRAVENOUS | Status: DC | PRN
  Administered 2018-06-21: 80 mL via INTRACARDIAC

## 2018-06-21 MED ORDER — SODIUM CHLORIDE 0.9 % IV SOLN: 250.0000 mL | INTRAVENOUS | Status: DC | PRN

## 2018-06-21 MED ORDER — HEPARIN SODIUM (PORCINE) 1000 UNIT/ML IJ SOLN
INTRAMUSCULAR | Status: DC | PRN
  Administered 2018-06-21: 5000 [IU] via INTRAVENOUS

## 2018-06-21 MED ORDER — NITROGLYCERIN 0.4 MG SL SUBL: 0.4000 mg | SUBLINGUAL_TABLET | SUBLINGUAL | Status: DC | PRN

## 2018-06-21 MED ORDER — CLOPIDOGREL BISULFATE 75 MG PO TABS
600.0000 mg | ORAL_TABLET | Freq: Once | ORAL | Status: AC
  Administered 2018-06-21: 600 mg via ORAL
  Filled 2018-06-21: qty 8

## 2018-06-21 MED ORDER — HEPARIN BOLUS VIA INFUSION
4000.0000 [IU] | Freq: Once | INTRAVENOUS | Status: AC
  Administered 2018-06-21: 4000 [IU] via INTRAVENOUS
  Filled 2018-06-21: qty 4000

## 2018-06-21 MED ORDER — LIDOCAINE HCL (PF) 1 % IJ SOLN
INTRAMUSCULAR | Status: DC | PRN
  Administered 2018-06-21: 2 mL

## 2018-06-21 MED ORDER — ACETAMINOPHEN 325 MG PO TABS: 650.0000 mg | ORAL_TABLET | ORAL | Status: DC | PRN

## 2018-06-21 MED ORDER — SODIUM CHLORIDE 0.9% FLUSH: 3.0000 mL | INTRAVENOUS | Status: DC | PRN

## 2018-06-21 MED ORDER — SODIUM CHLORIDE 0.9 % IV SOLN
INTRAVENOUS | Status: DC
  Administered 2018-06-21: 14:00:00 via INTRAVENOUS

## 2018-06-21 MED ORDER — HEPARIN (PORCINE) IN NACL 1000-0.9 UT/500ML-% IV SOLN
INTRAVENOUS | Status: AC
  Filled 2018-06-21: qty 1000

## 2018-06-21 MED ORDER — METOPROLOL SUCCINATE ER 50 MG PO TB24
50.0000 mg | ORAL_TABLET | Freq: Every day | ORAL | Status: DC
  Administered 2018-06-21 – 2018-06-22 (×2): 50 mg via ORAL
  Filled 2018-06-21 (×2): qty 1

## 2018-06-21 MED ORDER — SODIUM CHLORIDE 0.9% FLUSH: 3.0000 mL | Freq: Two times a day (BID) | INTRAVENOUS | Status: DC

## 2018-06-21 MED ORDER — HEPARIN SODIUM (PORCINE) 1000 UNIT/ML IJ SOLN
INTRAMUSCULAR | Status: AC
  Filled 2018-06-21: qty 1

## 2018-06-21 MED ORDER — MIDAZOLAM HCL 2 MG/2ML IJ SOLN
INTRAMUSCULAR | Status: DC | PRN
  Administered 2018-06-21: 1 mg via INTRAVENOUS

## 2018-06-21 MED ORDER — CLOPIDOGREL BISULFATE 75 MG PO TABS
75.0000 mg | ORAL_TABLET | Freq: Every day | ORAL | Status: DC
  Administered 2018-06-22: 75 mg via ORAL
  Filled 2018-06-21: qty 1

## 2018-06-21 MED ORDER — VERAPAMIL HCL 2.5 MG/ML IV SOLN
INTRAVENOUS | Status: DC | PRN
  Administered 2018-06-21: 15:00:00 via INTRA_ARTERIAL

## 2018-06-21 MED ORDER — ATORVASTATIN CALCIUM 40 MG PO TABS
40.0000 mg | ORAL_TABLET | Freq: Every day | ORAL | Status: DC
  Administered 2018-06-21: 40 mg via ORAL
  Filled 2018-06-21: qty 1

## 2018-06-21 MED ORDER — LIDOCAINE HCL (PF) 1 % IJ SOLN
INTRAMUSCULAR | Status: AC
  Filled 2018-06-21: qty 30

## 2018-06-21 MED ORDER — HEPARIN (PORCINE) IN NACL 1000-0.9 UT/500ML-% IV SOLN
INTRAVENOUS | Status: DC | PRN
  Administered 2018-06-21 (×2): 500 mL

## 2018-06-21 MED ORDER — HEPARIN (PORCINE) 25000 UT/250ML-% IV SOLN
1300.0000 [IU]/h | INTRAVENOUS | Status: DC
  Administered 2018-06-22: 1100 [IU]/h via INTRAVENOUS
  Filled 2018-06-21: qty 250

## 2018-06-21 MED ORDER — FENTANYL CITRATE (PF) 100 MCG/2ML IJ SOLN
INTRAMUSCULAR | Status: DC | PRN
  Administered 2018-06-21: 25 ug via INTRAVENOUS

## 2018-06-21 MED ORDER — ASPIRIN 81 MG PO CHEW: 81.0000 mg | CHEWABLE_TABLET | ORAL | Status: DC

## 2018-06-21 MED ORDER — MIDAZOLAM HCL 2 MG/2ML IJ SOLN
INTRAMUSCULAR | Status: AC
  Filled 2018-06-21: qty 2

## 2018-06-21 MED ORDER — FENTANYL CITRATE (PF) 100 MCG/2ML IJ SOLN
INTRAMUSCULAR | Status: AC
  Filled 2018-06-21: qty 2

## 2018-06-21 MED ORDER — ONDANSETRON HCL 4 MG/2ML IJ SOLN: 4.0000 mg | Freq: Four times a day (QID) | INTRAMUSCULAR | Status: DC | PRN

## 2018-06-21 MED ORDER — HEPARIN (PORCINE) 25000 UT/250ML-% IV SOLN
1100.0000 [IU]/h | INTRAVENOUS | Status: DC
  Administered 2018-06-21: 1100 [IU]/h via INTRAVENOUS
  Filled 2018-06-21: qty 250

## 2018-06-21 MED ORDER — ISOSORB DINITRATE-HYDRALAZINE 20-37.5 MG PO TABS
1.0000 | ORAL_TABLET | Freq: Three times a day (TID) | ORAL | Status: DC
  Administered 2018-06-21 – 2018-06-22 (×4): 1 via ORAL
  Filled 2018-06-21 (×4): qty 1

## 2018-06-21 SURGICAL SUPPLY — 10 items
CATH INFINITI 5 FR IM (CATHETERS) ×2 IMPLANT
CATH INFINITI 5FR MULTPACK ANG (CATHETERS) ×2 IMPLANT
DEVICE RAD COMP TR BAND LRG (VASCULAR PRODUCTS) ×2 IMPLANT
GLIDESHEATH SLEND A-KIT 6F 22G (SHEATH) ×2 IMPLANT
GUIDEWIRE INQWIRE 1.5J.035X260 (WIRE) ×1 IMPLANT
INQWIRE 1.5J .035X260CM (WIRE) ×2
KIT HEART LEFT (KITS) ×2 IMPLANT
PACK CARDIAC CATHETERIZATION (CUSTOM PROCEDURE TRAY) ×2 IMPLANT
TRANSDUCER W/STOPCOCK (MISCELLANEOUS) ×2 IMPLANT
TUBING CIL FLEX 10 FLL-RA (TUBING) ×2 IMPLANT

## 2018-06-21 NOTE — Brief Op Note (Signed)
BRIEF CARDIAC CATHETERIZATION NOTE  06/21/2018  3:16 PM  PATIENT:  [name]  73 y.o. male  PRE-OPERATIVE DIAGNOSIS:  NSTEMI  POST-OPERATIVE DIAGNOSIS:  NSTEMI  PROCEDURE:  Procedure(s): LEFT HEART CATH AND CORS/GRAFTS ANGIOGRAPHY (N/A)  SURGEON:  Surgeon(s) and Role:    * Toivo Bordon, Harrell Gave, MD - Primary  FINDINGS: 1. Severe native CAD, including occluded proximal LAD and LCx.  RCA with patient mid/distal stents and mild ISR. 2. Widely patent LIMA-LAD, SVG-OM1-OM, and SVG-D. 3. Mildly elevated LVEDP.  RECOMMENDATIONS: 1. Medical therapy; question intracoronary embolism with spontaneous lysis.  Plan to restart heparin infusion 2 hours after TR band removal and resumption of apixaban tomorrow AM if no evidence of bleeding. 2. Continue clopidogrel 75 mg daily. 3. Aggressive secondary prevention.  Nelva Bush, MD [facility] Pager: [phone]

## 2018-06-21 NOTE — Interval H&P Note (Signed)
History and Physical Interval Note:  06/21/2018 2:20 PM  [name]  has presented today for surgery, with the diagnosis of NSTEMI.  The various methods of treatment have been discussed with the patient and family. After consideration of risks, benefits and other options for treatment, the patient has consented to  Procedure(s): LEFT HEART CATH AND CORS/GRAFTS ANGIOGRAPHY (N/A) as a surgical intervention.  The patient's history has been reviewed, patient examined, no change in status, stable for surgery.  I have reviewed the patient's chart and labs.  Questions were answered to the patient's satisfaction.    Cath Lab Visit (complete for each Cath Lab visit)  Clinical Evaluation Leading to the Procedure:   ACS: Yes.    Non-ACS: N/A  [name]

## 2018-06-21 NOTE — Progress Notes (Signed)
ANTICOAGULATION CONSULT NOTE - Initial Consult  Pharmacy Consult for heparin Indication: chest pain/ACS  Allergies  Allergen Reactions  . Lisinopril Swelling and Other (See Comments)    Mouth and tongue swells    Patient Measurements: Height: 5\' 6"  (167.6 cm) Weight: 223 lb 8 oz (101.4 kg) IBW/kg (Calculated) : 63.8 Heparin Dosing Weight: 86kg  Vital Signs: Temp: 97.7 F (36.5 C) (03/16 0600) Temp Source: Oral (03/16 0600) BP: 171/96 (03/16 0600) Pulse Rate: 66 (03/16 0600)  Labs: Recent Labs    06/20/18 2153 06/21/18 0041 06/21/18 0726  HGB 15.4  --   --   HCT 49.8  --   --   PLT 145*  --   --   CREATININE 1.39*  --  1.46*  TROPONINI 0.09* 3.20*  --     Estimated Creatinine Clearance: 51 mL/min (A) (by C-G formula based on SCr of 1.46 mg/dL (H)).   Medical History: Past Medical History:  Diagnosis Date  . Apical mural thrombus   . Arteriosclerotic cardiovascular disease (ASCVD) 1998   Inferior MI->PCI of the RCA in 1998; acute anterior MI in 06/2002->  PCI of RCA and LAD with DES x2 to RCA, residual 80% ostial D1, and 80% mid CX and EF-40%; CABG-2011, LIMA-LAD, SVG to D1, OM1 & OM2; EF of 35-40% in 07/2010  . Chronic anticoagulation   . Chronic systolic CHF (congestive heart failure) ([place])   . CVA (cerebral vascular accident) ([place])   . HTN (hypertension)   . Hyperlipidemia   . Keloid    median sternotomy  . Pulmonary embolism ([place]) 03/2010  . Substance abuse ([place])    formerly cocaine    Assessment: 82 YOM with NSTEMI and hx CAD, plans for cath.  Has hx of apical thrombus and CVA, has been on Eliquis in past however pt reports PCP (not cards) took him off Eliquis and to take bASA alone with no dose taken in last month.  ED reported he took Eliquis at [facility], however pt clarified he takes Lipitor at that time to prevent strokes which may have led to confusion.  H/H wnl, plts 145 (chronic). Goal of Therapy:  Heparin level 0.3-0.7 units/ml Monitor platelets by  anticoagulation protocol: Yes   Plan:  Heparin 4000 units x 1, and gtt at 1100 units/hr F/u 8 hour heparin level  Bertis Ruddy, PharmD Clinical Pharmacist Please check AMION for all [place] numbers 06/21/2018 10:25 AM

## 2018-06-21 NOTE — Progress Notes (Signed)
Pt has arrived from [facility] for admit. PA on call notified via [place]. Pt is CP free. BP elevated at 171/96. Telemetry applied and awaiting admit orders. Jessie Foot, RN

## 2018-06-21 NOTE — H&P (Addendum)
Cardiology Admission History and Physical:   Patient ID: [name] MRN: [id number]; DOB: 1946/03/02   Admission date: 06/20/2018  Primary Care Provider: Rosita Fire, MD Primary Cardiologist: Kate Sable, MD  Primary Electrophysiologist:  None   Chief Complaint:  Chest pain   Patient Profile:   [name] is a 73 y/o AAM w/ a  history of CAD with prior CABG, DES to RCA in 11/1189, Chronic systolic HF LVEF 47-82%, apical thrombus, CVA, prior PE, chronic anticoagulation w/ Eliquis, HL, HTN and OSA, transferred from [facility] to [facility] for NSTEMI.   History of Present Illness:   [name] is followed by Dr. Bronson Ing. He has a h/o CAD with prior CABG, DES to RCA in 08/2017. Chronic systolic HF LVEF 95-62%, apical thrombus, CVA, prior PE, chronic anticoagulation w/ Eliquis HL, HTN and OSA. Last cardiac cath was in 08/2017 which showed LAD and LCX occluded, severe RCA stenosis. Patent LIMA-LAD, SVG-diag, sequential SVG to OM1 and OM2. He received a DES to RCA. Echo at that time showed LVEF at 35-40% with apical hypokinesis. Of note, he is also allergic to ACE inhibitors/angiotensin receptor blockers. He is on Bidil for chronic systolic HF.   He was hospitalized in 03/2018 for palpitations. Telemetry demonstrated PACs with up to 4 beats at a time.  There were rare PVCs and one couplet noted.  There were no sustained arrhythmias.  There was also evidence for sinus bradycardia to the low 40 bpm range at times. Outpatient monitor was recommended for further evaluation. However I do not see where this was completed.   He presented to [facility] w/ CC of CP. Occurred yesterday while at rest. Substernal chest pain. Pt has a hard time describing pain but notes it was a "hard pain" and different from his previous angina. There was no associated SOB, N/v. No radiation. It was severe, prompting him to go to the ED. He was given 3 SL NTG and pain resolved. No recurrence. Initial troponin was 0.09.  2nd troponin bumped to 3.20. H/H normal. Plt ct low at 145. BMP showed normal K at 3.6 and abnormal SCr at 1.39, c/w baseline (1.3-1.4). BP was elevated at [facility] in the 130Q-657Q systolic and 46N diastolic. EKG showed Sinus rhythm with marked sinus arrhythmia. Tele here at [facility] shows a 7 beat run of NSVT.   He is currently asymptomatic. No CP since he was given SL NTG at [facility]. He reports full med compliance, but believes he was taken off of his blood thinner, Eliquis, several months ago. He does not believe he is still taking a blood thinner.    Past Medical History:  Diagnosis Date   Apical mural thrombus    Arteriosclerotic cardiovascular disease (ASCVD) 1998   Inferior MI->PCI of the RCA in 1998; acute anterior MI in 06/2002->  PCI of RCA and LAD with DES x2 to RCA, residual 80% ostial D1, and 80% mid CX and EF-40%; CABG-2011, LIMA-LAD, SVG to D1, OM1 & OM2; EF of 35-40% in 07/2010   Chronic anticoagulation    Chronic systolic CHF (congestive heart failure) ([facility])    CVA (cerebral vascular accident) ([place])    HTN (hypertension)    Hyperlipidemia    Keloid    median sternotomy   Pulmonary embolism ([place]) 03/2010   Substance abuse ([place])    formerly cocaine    Past Surgical History:  Procedure Laterality Date   COLONOSCOPY N/A 01/22/2017   Procedure: COLONOSCOPY;  Surgeon: Danie Binder, MD;  [facility];  Service: Endoscopy;  Laterality: N/A;  200   CORONARY ARTERY BYPASS GRAFT  03/18/2010   LIMA-LAD, SVG to diagonal, OM1 & OM2   CORONARY STENT INTERVENTION N/A 08/21/2017   Procedure: CORONARY STENT INTERVENTION;  Surgeon: [name], MD;  Location: [place] CV LAB;  Service: Cardiovascular;  Laterality: N/A;   LEFT HEART CATH AND CORS/GRAFTS ANGIOGRAPHY N/A 08/21/2017   Procedure: LEFT HEART CATH AND CORS/GRAFTS ANGIOGRAPHY;  Surgeon: [name], MD;  Location: [place] CV LAB;  Service: Cardiovascular;  Laterality: N/A;   POLYPECTOMY  01/22/2017    Procedure: POLYPECTOMY;  Surgeon: Danie Binder, MD;  [facility];  Service: Endoscopy;;  Transverse(CS) and sigmoid colon(HS)   PTCA  06/1996   LAD & RCA   TEE WITHOUT CARDIOVERSION N/A 06/10/2012   Procedure: TRANSESOPHAGEAL ECHOCARDIOGRAM (TEE);  Surgeon: Josue Hector, MD;  [facility];  Service: Cardiovascular;  Laterality: N/A;     Medications Prior to Admission: Prior to Admission medications   Medication Sig Start Date End Date Taking? Authorizing Provider  apixaban (ELIQUIS) 5 MG TABS tablet Take 1 tablet (5 mg total) by mouth 2 (two) times daily. Restart 08/22/17. 08/21/17   Isaiah Serge, NP  aspirin EC 81 MG tablet Take 81 mg by mouth daily.    [provider]  atorvastatin (LIPITOR) 40 MG tablet Take 1 tablet (40 mg total) by mouth daily at 6 PM. 03/09/17   Debbe Odea, MD  BIDIL 20-37.5 MG tablet TAKE 1 TABLET BY MOUTH THREE TIMES A DAY 02/02/18   Herminio Commons, MD  clotrimazole-betamethasone (LOTRISONE) cream Apply 1 application topically daily as needed (for rah-irritation).    [provider]  cycloSPORINE (RESTASIS) 0.05 % ophthalmic emulsion Place 1 drop into both eyes daily as needed (dry eye).     [provider]  fluticasone (FLONASE) 50 MCG/ACT nasal spray Place 2 sprays into both nostrils 2 (two) times daily as needed for allergies or rhinitis.    [provider]  furosemide (LASIX) 40 MG tablet Take 1 tablet (40 mg total) by mouth daily. Patient taking differently: Take 20 mg by mouth daily.  09/24/17   Kathie Dike, MD  metoprolol succinate (TOPROL-XL) 50 MG 24 hr tablet Take 50 mg by mouth daily. 04/27/17   [provider]  nitroGLYCERIN (NITROSTAT) 0.4 MG SL tablet Place 1 tablet (0.4 mg total) under the tongue every 5 (five) minutes as needed for chest pain. up to 3 doses. 07/05/13   Lendon Colonel, NP  potassium chloride SA (K-DUR,KLOR-CON) 20 MEQ tablet Take 1 tablet (20 mEq total)  by mouth daily. 03/26/18   Herminio Commons, MD     Allergies:    Allergies  Allergen Reactions   Lisinopril Swelling and Other (See Comments)    Mouth and tongue swells    Social History:   Social History   Socioeconomic History   Marital status: Widowed    Spouse name: Not on file   Number of children: Not on file   Years of education: Not on file   Highest education level: Not on file  Occupational History   Occupation: disability  Social Needs   Financial resource strain: Not on file   Food insecurity:    Worry: Not on file    Inability: Not on file   Transportation needs:    Medical: Not on file    Non-medical: Not on file  Tobacco Use   Smoking  status: Former Smoker    Last attempt to quit: 2004    Years since quitting: 16.2   Smokeless tobacco: Never Used  Substance and Sexual Activity   Alcohol use: Yes    Comment: rare   Drug use: No    Types: Cocaine    Comment: Former cocaine abuse - last use for 51th birthday   Sexual activity: Not on file  Lifestyle   Physical activity:    Days per week: Not on file    Minutes per session: Not on file   Stress: Not on file  Relationships   Social connections:    Talks on phone: Not on file    Gets together: Not on file    Attends religious service: Not on file    Active member of club or organization: Not on file    Attends meetings of clubs or organizations: Not on file    Relationship status: Not on file   Intimate partner violence:    Fear of current or ex partner: Not on file    Emotionally abused: Not on file    Physically abused: Not on file    Forced sexual activity: Not on file  Other Topics Concern   Not on file  Social History Narrative   Not on file    Family History:   The patient's family history includes Cancer (age of onset: 71) in his mother.    ROS:  Please see the history of present illness.  All other ROS reviewed and negative.     Physical Exam/Data:    Vitals:   06/21/18 0400 06/21/18 0415 06/21/18 0430 06/21/18 0600  BP: (!) 165/85  (!) 174/94 (!) 171/96  Pulse: (!) 53 (!) 51 (!) 51 66  Resp: 11 10 11 19   Temp:    97.7 F (36.5 C)  TempSrc:    Oral  SpO2: 94% 94% 95% 99%  Weight:    101.4 kg  Height:    5\' 6"  (1.676 m)   No intake or output data in the 24 hours ending 06/21/18 0742 Last 3 Weights 06/21/2018 06/20/2018 03/26/2018  Weight (lbs) 223 lb 8 oz 224 lb 225 lb  Weight (kg) 101.379 kg 101.606 kg 102.059 kg     Body mass index is 36.07 kg/m.  General:  Well nourished, well developed, in no acute distress HEENT: normal Lymph: no adenopathy Neck: no JVD Endocrine:  No thryomegaly Vascular: No carotid bruits; FA pulses 2+ bilaterally without bruits  Cardiac:  normal S1, S2; RRR; no murmur  Lungs:  clear to auscultation bilaterally, no wheezing, rhonchi or rales  Abd: soft, nontender, no hepatomegaly  Ext: no edema Musculoskeletal:  No deformities, BUE and BLE strength normal and equal Skin: warm and dry  Neuro:  CNs 2-12 intact, no focal abnormalities noted Psych:  Normal affect    EKG:  The ECG that was done 06/20/18 was personally reviewed and demonstrates Sinus rhythm with marked sinus arrhythmia  Tele:  NSR currently, w/ PVCs, 7 beat run of NSVT on tele.   Relevant CV Studies: [facility] 08/21/17 Procedures   CORONARY STENT INTERVENTION  LEFT HEART CATH AND CORS/GRAFTS ANGIOGRAPHY  Conclusion   1. Severe 3 vessel native CAD with severe RCA stenosis, total LAD and LCx occlusion 2. S/P CABG with continued patency of the LIMA-LAD, SVG-diagonal, and sequential SVG-OM1 and OM2 3. Known moderate-severe LV dysfunction 4. Successful PCI of the native RCA with a 3.5x18 mm Resolute Onyx DES  Recommend: Recommend to resume  Apixaban, at currently prescribed dose and frequency, tomorrow, 08/22/17. Recommend antiplatelet therapy of Clopidogrel 75mg  QD for 6 months.   Would avoid aspirin to reduce excessive bleeding risk  associated with triple therapy in this patient who does not have ACS at presentation   2D Echo 08/11/17 Study Conclusions  - Left ventricle: The cavity size was normal. Wall thickness was   increased in a pattern of mild LVH. Systolic function was   moderately reduced. The estimated ejection fraction was in the   range of 35% to 40%. - Regional wall motion abnormality: Hypokinesis of the mid-apical   anterior, mid anteroseptal, and apical myocardium. Near   resolution of prior apical thrombus, small remnant remains. - Technically difficult study. Echocontrast was used to enhance   visualization. - Limited echo to evaluate LV function and LV thrombus.  Laboratory Data:  Chemistry Recent Labs  Lab 06/20/18 2153  NA 141  K 3.6  CL 105  CO2 29  GLUCOSE 144*  BUN 16  CREATININE 1.39*  CALCIUM 8.9  GFRNONAA 50*  GFRAA 58*  ANIONGAP 7    No results for input(s): PROT, ALBUMIN, AST, ALT, ALKPHOS, BILITOT in the last 168 hours. Hematology Recent Labs  Lab 06/20/18 2153  WBC 6.4  RBC 5.37  HGB 15.4  HCT 49.8  MCV 92.7  MCH 28.7  MCHC 30.9  RDW 13.3  PLT 145*   Cardiac Enzymes Recent Labs  Lab 06/20/18 2153 06/21/18 0041  TROPONINI 0.09* 3.20*   No results for input(s): TROPIPOC in the last 168 hours.  BNPNo results for input(s): BNP, PROBNP in the last 168 hours.  DDimer No results for input(s): DDIMER in the last 168 hours.  Radiology/Studies:  Dg Chest 2 View  Result Date: 06/20/2018 CLINICAL DATA:  Chest pain EXAM: CHEST - 2 VIEW COMPARISON:  March 25, 2018 FINDINGS: There is no appreciable edema or consolidation. Heart is upper normal in size with pulmonary vascularity normal. No adenopathy. Patient is status post coronary artery bypass grafting. No pneumothorax. No bone lesions. IMPRESSION: Status post coronary artery bypass grafting. Heart upper normal in size. No edema or consolidation. Electronically Signed   By: Lowella Grip III M.D.   On:  06/20/2018 21:13    Assessment and Plan:   [name] is a 73 y/o AAM w/ a  history of CAD with prior CABG, DES to RCA in 0/2409, Chronic systolic HF LVEF 73-53%, apical thrombus, CVA, prior PE, on chronic anticoagulation w/ Eliquis, HL, HTN and OSA, transferred from [facility] to [facility] for NSTEMI.   1. NSTEMI w/ known CAD: s/p remote CABG and recent PCI to native RCA 5/19. Pt with episode of CP at rest yesterday, relieved w/ SL NTG. Troponin 0.09>>3.20. Currently CP free. EKG showed SR with sinus arrhthymia. Exam is benign. Given his history, will plan for cardiac cath to reassess coronaries and grafts. Eliquis is on med list, but pt does not believe he is taking this any longer. Continue ASA, statin and metoprolol.   2. Chronic Systolic HF: EF 29-92% by echo in 08/2017. Allergic to ACE/ARBs (swelling). On Bidil for afterload reduction +  blocker, metoprolol. Appears euvolemic on exam. Will hold home diuretic, for anticipated cath. He has chronic renal insuffiencey, w/ SCr at 1.3. 7 beat run of NSVT noted on tele. Continue to monitor. Continue metoprolol.   3. H/o CVA: supposed to be on Eliquis, but pt believes he is no longer taking. Reports a provider stopped it. Per last cardiology  note by Dr. Bronson Ing, 03/2018, he was still taking. Will not order given need for likely cath. Will need to consider resuming once all invasive procedures are completed.    4. H/o Apical Thrombus: supposed to be on Eliquis, but pt believes he is no longer taking. Reports a provider stopped it. Per last cardiology note by Dr. Bronson Ing, 03/2018, he was still taking. Will not order given need for likely cath. Will need to consider resuming once all invasive procedures are completed.    5. HTN: elevated in the 086P systolic. Will order home meds>> metoprolol + Bidil. Will hold Lasix for now, given possible cath today.   6. HLD: continue home statin. Check FLP to assess baseline LDL. Goal in the setting of CAD  is < 70 mg/dL.   7. NSVT: 7 beat run noted on tele. Also in the setting of reduced LVEF and? Coronary ischemia. Plan likely cath. Home metoprolol ordered. K WNL. Monitor.   Discussed plan with MD. Marena Chancy if pt actually took Eliquis yesterday. On med list but pt cannot recall if he is still taking. Does not have his home meds with him. He is CP free. Will will start IV heparin per pharmacy and will wait until 3/17 for cardiac cath. Will order heart healthy diet. NPO at midnight.   For questions or updates, please contact [place] Please consult [email or web address] for contact info under     Signed, Lyda Jester, PA-C  06/21/2018 7:42 AM   Patient seen, examined. Available data reviewed. Agree with findings, assessment, and plan as outlined by Lyda Jester, PA-C.  The patient is independently interviewed and examined.  On my exam he is alert, oriented, in no distress.  He is a poor historian, also hard of hearing.  JVP is normal, carotid upstrokes are normal without bruits, lung fields are clear bilaterally, heart is regular rate and rhythm with no murmur or gallop, abdomen is soft and nontender, extremities show no edema.  The patient is chest pain-free at present.  EKG shows sinus rhythm with biatrial enlargement, ST and T wave abnormality in the anterolateral leads is unchanged.  He clearly has ruled in for non-STEMI and should undergo cardiac catheterization and possible PCI.  He has been maintained on long-term anticoagulation with LV apical thrombus, but it is somewhat unclear whether he has been taking this.  Initially I had planned on treating him with IV heparin and performing cardiac catheterization tomorrow to make sure that he has had 24 hours without an oral anticoagulant on board.  However, his troponin has trended from 0.09-3 0.20-16.18 from his most recent lab.  I think we should move towards cardiac catheterization more urgently.  Hopefully he can be done from a left radial  approach with his previous bypass grafting.  He has a 1+ pulse at the left radial site.  I have reviewed the risks, indications, and alternatives of cardiac catheterization and PCI with the patient.  I have reviewed the risks, indications, and alternatives to cardiac catheterization, possible angioplasty, and stenting with the patient. Risks include but are not limited to bleeding, infection, vascular injury, stroke, myocardial infection, arrhythmia, kidney injury, radiation-related injury in the case of prolonged fluoroscopy use, emergency cardiac surgery, and death. The patient understands the risks of serious complication is 1-2 in 6195 with diagnostic cardiac cath and 1-2% or less with angioplasty/stenting. He understands and agrees to proceed.  I will load him with Plavix 600 mg now.  Will avoid ticagrelor or Effient since he requires  oral anticoagulation.  [name], M.D. 06/21/2018 11:41 AM

## 2018-06-21 NOTE — ED Notes (Signed)
CRITICAL VALUE ALERT  Critical Value:  Troponin 3.20  Date & Time Notied:  06/21/18 0120  Provider Notified: [email or web address]  Orders Received/Actions taken: md notified

## 2018-06-21 NOTE — Progress Notes (Signed)
[place] for heparin Indication: chest pain/ACS  Allergies  Allergen Reactions  . Lisinopril Swelling and Other (See Comments)    Mouth and tongue swells    Patient Measurements: Height: 5\' 6"  (167.6 cm) Weight: 223 lb 8 oz (101.4 kg) IBW/kg (Calculated) : 63.8 Heparin Dosing Weight: 86kg  Vital Signs: Temp: 97.7 F (36.5 C) (03/16 0600) Temp Source: Oral (03/16 0600) BP: 162/83 (03/16 1216) Pulse Rate: 66 (03/16 0600)  Labs: Recent Labs    06/20/18 2153 06/21/18 0041 06/21/18 0726 06/21/18 0949  HGB 15.4  --   --   --   HCT 49.8  --   --   --   PLT 145*  --   --   --   CREATININE 1.39*  --  1.46*  --   TROPONINI 0.09* 3.20*  --  16.18*    Estimated Creatinine Clearance: 51 mL/min (A) (by C-G formula based on SCr of 1.46 mg/dL (H)).   Medical History: Past Medical History:  Diagnosis Date  . Apical mural thrombus   . Arteriosclerotic cardiovascular disease (ASCVD) 1998   Inferior MI->PCI of the RCA in 1998; acute anterior MI in 06/2002->  PCI of RCA and LAD with DES x2 to RCA, residual 80% ostial D1, and 80% mid CX and EF-40%; CABG-2011, LIMA-LAD, SVG to D1, OM1 & OM2; EF of 35-40% in 07/2010  . Chronic anticoagulation   . Chronic systolic CHF (congestive heart failure) ([place])   . CVA (cerebral vascular accident) ([place])   . HTN (hypertension)   . Hyperlipidemia   . Keloid    median sternotomy  . Pulmonary embolism ([place]) 03/2010  . 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  Has hx of apical thrombus and CVA, has been on Eliquis in past however pt reports PCP (not cards) took him off Eliquis and to take bASA alone with no dose taken in last month. ED reported he took Eliquis at [facility], however pt clarified he takes Lipitor at that time to prevent strokes which may have led to confusion.    Underwent cardiac cath on 3/16 showing severe native CAD including occluded  prox LAD and LCx - plan for medical management. Plan to restart heparin 2 hours after TR band removal (documented on 3/16@2130 ) with possible apixaban start on 3/17 in AM.   Goal of Therapy:  Heparin level 0.3-0.7 units/ml Monitor platelets by anticoagulation protocol: Yes   Plan:  Restart heparin infusion at 1100 units/hr on 3/16 at 2330 Monitor level with morning labs Monitor heparin level, CBC, and for s/sx of bleeding daily F/u restart of apixaban   Antonietta Jewel, PharmD, BCCCP Clinical Pharmacist  Pager: [phone] Phone: [phone] Please check AMION for all [place] numbers 06/21/2018 4:05 PM

## 2018-06-21 NOTE — ED Provider Notes (Signed)
Patient left a change of shift to get delta troponin results.  He has had a chronically mildly elevated troponin.  However his first troponin was slightly higher than his baseline and his delta troponin tonight significantly changed.  Patient states he had some chest pain around dark that started suddenly while he was walking in his house.  He states it lasted about 20 minutes and was relieved when he took some sublingual nitroglycerin.  He states he has remained pain-free since that time.   Results for orders placed or performed during the hospital encounter of 70/01/74  Basic metabolic panel  Result Value Ref Range   Sodium 141 135 - 145 mmol/L   Potassium 3.6 3.5 - 5.1 mmol/L   Chloride 105 98 - 111 mmol/L   CO2 29 22 - 32 mmol/L   Glucose, Bld 144 (H) 70 - 99 mg/dL   BUN 16 8 - 23 mg/dL   Creatinine, Ser 1.39 (H) 0.61 - 1.24 mg/dL   Calcium 8.9 8.9 - 10.3 mg/dL   GFR calc non Af Amer 50 (L) >60 mL/min   GFR calc Af Amer 58 (L) >60 mL/min   Anion gap 7 5 - 15  CBC  Result Value Ref Range   WBC 6.4 4.0 - 10.5 K/uL   RBC 5.37 4.22 - 5.81 MIL/uL   Hemoglobin 15.4 13.0 - 17.0 g/dL   HCT 49.8 39.0 - 52.0 %   MCV 92.7 80.0 - 100.0 fL   MCH 28.7 26.0 - 34.0 pg   MCHC 30.9 30.0 - 36.0 g/dL   RDW 13.3 11.5 - 15.5 %   Platelets 145 (L) 150 - 400 K/uL   nRBC 0.0 0.0 - 0.2 %  Troponin I - ONCE - STAT  Result Value Ref Range   Troponin I 0.09 (HH) <0.03 ng/mL  Troponin I - ONCE - STAT  Result Value Ref Range   Troponin I 3.20 (HH) <0.03 ng/mL   Laboratory interpretation all normal except positive delta troponin    Cardiac catheterization Aug 21, 2017 1. Severe 3 vessel native CAD with severe RCA stenosis, total LAD and LCx occlusion 2. S/P CABG with continued patency of the LIMA-LAD, SVG-diagonal, and sequential SVG-OM1 and OM2 3. Known moderate-severe LV dysfunction 4. Successful PCI of the native RCA with a 3.5x18 mm Resolute Onyx DES  Recommend: Recommend to resume  Apixaban, at currently prescribed dose and frequency, tomorrow, 08/22/17. Recommend antiplatelet therapy of Clopidogrel 75mg  QD for 6 months.   Would avoid aspirin to reduce excessive bleeding risk associated with triple therapy in this patient who does not have ACS at presentation  Echocardiogram Aug 11, 2017 Study Conclusions  - Left ventricle: The cavity size was normal. Wall thickness was   increased in a pattern of mild LVH. Systolic function was   moderately reduced. The estimated ejection fraction was in the   range of 35% to 40%. - Regional wall motion abnormality: Hypokinesis of the mid-apical   anterior, mid anteroseptal, and apical myocardium. Near   resolution of prior apical thrombus, small remnant remains. - Technically difficult study. Echocontrast was used to enhance   visualization. - Limited echo to evaluate LV function and LV thrombus.   01:56 AM Pt discussed with Dr Kirby Crigler, Cardiology, wants patient to be transferred to [facility] to be admitted to telemetry under Dr. Percival Spanish service.  Patient states he took his Eliquis this evening around 6 PM.  Cardiology states he will need heparin started at 6 AM.  Patient  is aware that he needs to be admitted.  He remains chest pain-free.  Bed request was placed.  Diagnoses that have been ruled out:  None  Diagnoses that are still under consideration:  None  Final diagnoses:  NSTEMI (non-ST elevated myocardial infarction) [facility])   Plan admission  Rolland Porter, MD, Barbette Or, MD 06/21/18 [phone]

## 2018-06-22 ENCOUNTER — Encounter (HOSPITAL_COMMUNITY): Payer: Self-pay | Admitting: Internal Medicine

## 2018-06-22 ENCOUNTER — Inpatient Hospital Stay (HOSPITAL_COMMUNITY): Payer: Medicare Other

## 2018-06-22 DIAGNOSIS — I214 Non-ST elevation (NSTEMI) myocardial infarction: Secondary | ICD-10-CM

## 2018-06-22 LAB — LIPID PANEL
Cholesterol: 79 mg/dL (ref 0–200)
HDL: 40 mg/dL — ABNORMAL LOW (ref >40)
LDL Cholesterol: 31 mg/dL (ref 0–99)
Total CHOL/HDL Ratio: 2 RATIO
Triglycerides: 38 mg/dL (ref <150)
VLDL: 8 mg/dL (ref 0–40)

## 2018-06-22 LAB — CBC
HCT: 49.2 % (ref 39.0–52.0)
Hemoglobin: 15.5 g/dL (ref 13.0–17.0)
MCH: 28 pg (ref 26.0–34.0)
MCHC: 31.5 g/dL (ref 30.0–36.0)
MCV: 89 fL (ref 80.0–100.0)
Platelets: 131 10*3/uL — ABNORMAL LOW (ref 150–400)
RBC: 5.53 MIL/uL (ref 4.22–5.81)
RDW: 13.2 % (ref 11.5–15.5)
WBC: 7.1 10*3/uL (ref 4.0–10.5)
nRBC: 0 % (ref 0.0–0.2)

## 2018-06-22 LAB — BASIC METABOLIC PANEL
Anion gap: 8 (ref 5–15)
BUN: 11 mg/dL (ref 8–23)
CO2: 27 mmol/L (ref 22–32)
Calcium: 8.6 mg/dL — ABNORMAL LOW (ref 8.9–10.3)
Chloride: 102 mmol/L (ref 98–111)
Creatinine, Ser: 1.5 mg/dL — ABNORMAL HIGH (ref 0.61–1.24)
GFR calc Af Amer: 53 mL/min — ABNORMAL LOW (ref >60)
GFR calc non Af Amer: 46 mL/min — ABNORMAL LOW (ref >60)
Glucose, Bld: 111 mg/dL — ABNORMAL HIGH (ref 70–99)
Potassium: 3.8 mmol/L (ref 3.5–5.1)
Sodium: 137 mmol/L (ref 135–145)

## 2018-06-22 LAB — ECHOCARDIOGRAM COMPLETE
Height: 66 in
Weight: 3550.4 oz

## 2018-06-22 LAB — HEPARIN LEVEL (UNFRACTIONATED): Heparin Unfractionated: 0.15 IU/mL — ABNORMAL LOW (ref 0.30–0.70)

## 2018-06-22 MED ORDER — CLOPIDOGREL BISULFATE 75 MG PO TABS: 75.0000 mg | ORAL_TABLET | Freq: Every day | ORAL | 3 refills | Status: DC

## 2018-06-22 MED ORDER — FUROSEMIDE 40 MG PO TABS: 20.0000 mg | ORAL_TABLET | Freq: Every day | ORAL | Status: DC

## 2018-06-22 MED ORDER — APIXABAN 5 MG PO TABS: 5.0000 mg | ORAL_TABLET | Freq: Two times a day (BID) | ORAL | 3 refills | Status: DC

## 2018-06-22 MED ORDER — APIXABAN 5 MG PO TABS
5.0000 mg | ORAL_TABLET | Freq: Two times a day (BID) | ORAL | Status: DC
  Administered 2018-06-22: 5 mg via ORAL
  Filled 2018-06-22: qty 1

## 2018-06-22 MED ORDER — PERFLUTREN LIPID MICROSPHERE
1.0000 mL | INTRAVENOUS | Status: AC | PRN
  Administered 2018-06-22: 3 mL via INTRAVENOUS
  Filled 2018-06-22: qty 10

## 2018-06-22 NOTE — Plan of Care (Signed)
  Problem: Clinical Measurements: Goal: Will remain free from infection Outcome: Progressing Note:  No s/s of infection noted. Goal: Respiratory complications will improve Outcome: Progressing Note:  No s/s of respiratory complications noted.   

## 2018-06-22 NOTE — Plan of Care (Signed)
  Problem: Clinical Measurements: Goal: Will remain free from infection Outcome: Progressing Note:  No s/s of infection noted. Goal: Respiratory complications will improve Outcome: Progressing Note:  No s/s of respiratory complications noted.   Problem: Cardiovascular: Goal: Vascular access site(s) Level 0-1 will be maintained Outcome: Progressing Note:  Right radial site remains level 0.

## 2018-06-22 NOTE — Progress Notes (Addendum)
ANTICOAGULATION CONSULT NOTE  Pharmacy Consult for heparin > Eliquis Indication: chest pain/ACS  Allergies  Allergen Reactions  . Lisinopril Swelling and Other (See Comments)    Mouth and tongue swells    Patient Measurements: Height: 5\' 6"  (167.6 cm) Weight: 221 lb 14.4 oz (100.7 kg) IBW/kg (Calculated) : 63.8 Heparin Dosing Weight: 86kg  Vital Signs: Temp: 98.5 F (36.9 C) (03/17 0349) Temp Source: Oral (03/17 0349) BP: 133/61 (03/17 0349) Pulse Rate: 58 (03/17 0349)  Labs: Recent Labs    06/20/18 2153 06/21/18 0041 06/21/18 0726 06/21/18 0949 06/22/18 0340 06/22/18 0710  HGB 15.4  --   --   --  15.5  --   HCT 49.8  --   --   --  49.2  --   PLT 145*  --   --   --  131*  --   HEPARINUNFRC  --   --   --   --   --  0.15*  CREATININE 1.39*  --  1.46*  --  1.50*  --   TROPONINI 0.09* 3.20*  --  16.18*  --   --     Estimated Creatinine Clearance: 49.5 mL/min (A) (by C-G formula based on SCr of 1.5 mg/dL (H)).   Medical History: Past Medical History:  Diagnosis Date  . Apical mural thrombus   . Arteriosclerotic cardiovascular disease (ASCVD) 1998   Inferior MI->PCI of the RCA in 1998; acute anterior MI in 06/2002->  PCI of RCA and LAD with DES x2 to RCA, residual 80% ostial D1, and 80% mid CX and EF-40%; CABG-2011, LIMA-LAD, SVG to D1, OM1 & OM2; EF of 35-40% in 07/2010  . Chronic anticoagulation   . Chronic systolic CHF (congestive heart failure) ([place])   . CVA (cerebral vascular accident) ([place])   . HTN (hypertension)   . Hyperlipidemia   . Keloid    median sternotomy  . Pulmonary embolism ([place]) 03/2010  . Substance abuse ([place])    formerly cocaine    Assessment: 57 YOM with NSTEMI and hx CAD, plans for cath.  Has hx of apical thrombus and CVA, has been on Eliquis in past however pt reports PCP (not cards) took him off Eliquis and to take bASA alone with no dose taken in last month. ED reported he took Eliquis at [facility], however pt clarified he takes Lipitor at  that time to prevent strokes which may have led to confusion.    Underwent cardiac cath on 3/16 showing severe native CAD including occluded prox LAD and LCx - plan for medical management. Plan to restart heparin 2 hours after TR band removal (documented on 3/16@2130 ) with possible apixaban start on 3/17 in AM.   Goal of Therapy:  Heparin level 0.3-0.7 units/ml Monitor platelets by anticoagulation protocol: Yes   Plan:  Increase heparin to 1300 units/hr. Recheck heparin level in 8 hrs. Monitor heparin level, CBC, and for s/sx of bleeding daily F/u restart of apixaban   [name], [name], [facility] Clinical Pharmacist Phone [phone]  06/22/2018 7:41 AM   Addendum: Will stop IV heparin and and resume Eliquis now.  [name], [facility] Clinical Pharmacist Phone [phone]  06/22/2018 9:22 AM

## 2018-06-22 NOTE — Discharge Summary (Signed)
Discharge Summary    Patient ID: [name],  MRN: [id number], DOB/AGE: 72-06-1945 72 y.o.  Admit date: 06/20/2018 Discharge date: 06/22/2018  Primary Care Provider: Rosita Fire Primary Cardiologist: Dr. Bronson Ing   Discharge Diagnoses    Active Problems:   NSTEMI (non-ST elevated myocardial infarction) [facility])   Hyperlipidemia   Essential hypertension   Pulmonary embolism (HCC)   CKD (chronic kidney disease), stage III (HCC)   Chronic systolic heart failure (HCC)   Allergies Allergies  Allergen Reactions   Lisinopril Swelling and Other (See Comments)    Mouth and tongue swells    Diagnostic Studies/Procedures    Cath: 06/21/2018  Conclusions: 1. Severe native coronary artery disease, including occluded proximal LAD and LCx. RCA with patent mid/distal stents and mild in-stent restenosis. 2. Widely patent LIMA-LAD, SVG-OM1-OM, and SVG-D. 3. Mildly elevated left ventricular filling pressure.  Recommendations: 1. Medical therapy; question coronary embolism with spontaneous lysis. Plan to restart heparin infusion 2 hours after TR band removal and resumption of apixaban tomorrow AM if no evidence of bleeding. 2. Continue clopidogrel 75 mg daily.  Defer continuing aspirin in the setting of long-term anticoagulation and clopidogrel use. 3. Aggressive secondary prevention.  Nelva Bush, MD [facility]  TTE: 06/22/2018  IMPRESSIONS    1. The left ventricle has moderately reduced systolic function, with an ejection fraction of 35-40%. The cavity size was normal. There is mildly increased left ventricular wall thickness. Left ventricular diastolic Doppler parameters are consistent with  impaired relaxation. Indeterminate filling pressures The E/e' is 8-15.  2. Severe akinesis of the left ventricular, mid-apical inferior wall, anteroseptal wall, inferoseptal wall and anteroapical wall.  3. The right ventricle has normal systolic function. The cavity was  normal. There is no increase in right ventricular wall thickness.  4. The aortic valve was not well visualized.  5. The aortic root and ascending aorta are normal in size and structure.  6. The interatrial septum was not well visualized.  _____________   History of Present Illness     [name] is followed by Dr. Bronson Ing. He has a h/o CAD with prior CABG, DES to RCA in 08/2017. Chronic systolic HF LVEF 78-29%, apical thrombus, CVA, prior PE, chronic anticoagulation w/ Eliquis HL, HTN and OSA. Last cardiac cath was in 08/2017 which showed LAD and LCX occluded, severe RCA stenosis. Patent LIMA-LAD, SVG-diag, sequential SVG to OM1 and OM2. He received a DES to RCA. Echo at that time showed LVEF at 35-40% with apical hypokinesis. Of note, he is also allergic to ACE inhibitors/angiotensin receptor blockers. He is on Bidil for chronic systolic HF.   He was hospitalized in 03/2018 for palpitations. Telemetry demonstrated PACs with up to 4 beats at a time. There were rare PVCs and one couplet noted. There were no sustained arrhythmias. There was also evidence for sinus bradycardia to the low 40 bpm range at times. Outpatient monitor was recommended for further evaluation. However it appears this was not completed.   He presented to [facility] w/ CC of CP. Occurred the day prior to admission while at rest. Substernal chest pain. Pt had a hard time describing pain but notes it was a "hard pain" and different from his previous angina. There was no associated SOB, N/v. No radiation. It was severe, prompting him to go to the ED. He was given 3 SL NTG and pain resolved. No recurrence. Initial troponin was 0.09. 2nd troponin bumped to 3.20. H/H normal. Plt ct low at 145. BMP  showed normal K at 3.6 and abnormal SCr at 1.39, c/w baseline (1.3-1.4). BP was elevated at [facility] in the 638V-564P systolic and 32R diastolic. EKG showed Sinus rhythm with marked sinus arrhythmia. Tele at [facility] showed a 7 beat run of NSVT.   He was  asymptomatic at the time of assessment. No CP since he was given SL NTG at [facility]. He reported full med compliance, but believed he was taken off of his blood thinner, Eliquis, several months ago. He did not believe he was still taking a blood thinner. Given his symptoms he was admitted and underwent cardiac cath.   Hospital Course     1. NSTEMI w/ known CAD:s/p remote CABG and recent PCI to native RCA 5/19. Troponin peaked at 16.18. Underwent cath yesterday with patent grafts, and patent stents in the RCA. Question thrombus with spontaneous lysis prior to cath? Recommendation to place back on plavix given this NSTEMI -- will restart Eliquis, continue BB, Bidil, statin. No ASA.   2. Chronic Systolic HF: EF 51-88% by echo in 08/2017. Allergic to ACE/ARBs (swelling). On Bidil for afterload reduction +?blocker, metoprolol. Diuretic held for cath, will resume at discharge. Volume stable on exam.  -- repeat echo with continued EF of 35-40% with segmental WMA noted.   3. H/o CZY:[id number] to be on Eliquis, but pt believes he is no longer taking? Restarted post cath.  4. H/o Apical Thrombus:supposed to be on Eliquis, but pt believed he was no longer taking.  -- restarted Eliquis 5mg  BID prior to discharge  5. FUX:NATFTD  6. HLD: continue home statin. LDL 31.  7. NSVT:Improved. Continue on current BB dose as he is bradycardiac. Stable electrolytes.   [name] was seen by Dr. Burt Knack and determined stable for discharge home. Follow up in the office has been arranged. Medications are listed below.   _____________  Discharge Vitals Blood pressure 137/82, pulse (!) 58, temperature 98.5 F (36.9 C), temperature source Oral, resp. rate 13, height 5\' 6"  (1.676 m), weight 100.7 kg, SpO2 96 %.  Filed Weights   06/20/18 2043 06/21/18 0600 06/22/18 0349  Weight: 101.6 kg 101.4 kg 100.7 kg    Labs & Radiologic Studies    CBC Recent Labs    06/20/18 2153 06/22/18 0340  WBC 6.4 7.1   HGB 15.4 15.5  HCT 49.8 49.2  MCV 92.7 89.0  PLT 145* 322*   Basic Metabolic Panel Recent Labs    06/21/18 0726 06/22/18 0340  NA 139 137  K 3.8 3.8  CL 104 102  CO2 27 27  GLUCOSE 109* 111*  BUN 11 11  CREATININE 1.46* 1.50*  CALCIUM 8.8* 8.6*   Liver Function Tests No results for input(s): AST, ALT, ALKPHOS, BILITOT, PROT, ALBUMIN in the last 72 hours. No results for input(s): LIPASE, AMYLASE in the last 72 hours. Cardiac Enzymes Recent Labs    06/20/18 2153 06/21/18 0041 06/21/18 0949  TROPONINI 0.09* 3.20* 16.18*   BNP Invalid input(s): POCBNP D-Dimer No results for input(s): DDIMER in the last 72 hours. Hemoglobin A1C No results for input(s): HGBA1C in the last 72 hours. Fasting Lipid Panel Recent Labs    06/22/18 0340  CHOL 79  HDL 40*  LDLCALC 31  TRIG 38  CHOLHDL 2.0   Thyroid Function Tests No results for input(s): TSH, T4TOTAL, T3FREE, THYROIDAB in the last 72 hours.  Invalid input(s): FREET3 _____________  Dg Chest 2 View  Result Date: 06/20/2018 CLINICAL DATA:  Chest pain EXAM: CHEST - 2  VIEW COMPARISON:  March 25, 2018 FINDINGS: There is no appreciable edema or consolidation. Heart is upper normal in size with pulmonary vascularity normal. No adenopathy. Patient is status post coronary artery bypass grafting. No pneumothorax. No bone lesions. IMPRESSION: Status post coronary artery bypass grafting. Heart upper normal in size. No edema or consolidation. Electronically Signed   By: Lowella Grip III M.D.   On: 06/20/2018 21:13   Disposition   Pt is being discharged home today in good condition.  Follow-up Plans & Appointments    Follow-up Information    Herminio Commons, MD Follow up on 07/30/2018.   Specialty:  Cardiology Why:  at 1:20pm for your follow up appt.  Contact information: [place] [phone]          Discharge Instructions    Diet - low sodium heart healthy   Complete by:  As  directed    Discharge instructions   Complete by:  As directed    Radial Site Care Refer to this sheet in the next few weeks. These instructions provide you with information on caring for yourself after your procedure. Your caregiver may also give you more specific instructions. Your treatment has been planned according to current medical practices, but problems sometimes occur. Call your caregiver if you have any problems or questions after your procedure. HOME CARE INSTRUCTIONS You may shower the day after the procedure.Remove the bandage (dressing) and gently wash the site with plain soap and water.Gently pat the site dry.  Do not apply powder or lotion to the site.  Do not submerge the affected site in water for 3 to 5 days.  Inspect the site at least twice daily.  Do not flex or bend the affected arm for 24 hours.  No lifting over 5 pounds (2.3 kg) for 5 days after your procedure.  Do not drive home if you are discharged the same day of the procedure. Have someone else drive you.  You may drive 24 hours after the procedure unless otherwise instructed by your caregiver.  What to expect: Any bruising will usually fade within 1 to 2 weeks.  Blood that collects in the tissue (hematoma) may be painful to the touch. It should usually decrease in size and tenderness within 1 to 2 weeks.  SEEK IMMEDIATE MEDICAL CARE IF: You have unusual pain at the radial site.  You have redness, warmth, swelling, or pain at the radial site.  You have drainage (other than a small amount of blood on the dressing).  You have chills.  You have a fever or persistent symptoms for more than 72 hours.  You have a fever and your symptoms suddenly get worse.  Your arm becomes pale, cool, tingly, or numb.  You have heavy bleeding from the site. Hold pressure on the site.   We have restarted your Eliquis this admission and started you on plavix. Please monitor for signs and symptoms of bleeding.   Increase activity  slowly   Complete by:  As directed      Discharge Medications     Medication List    STOP taking these medications   aspirin EC 81 MG tablet   potassium chloride SA 20 MEQ tablet Commonly known as:  K-DUR,KLOR-CON     TAKE these medications   apixaban 5 MG Tabs tablet Commonly known as:  Eliquis Take 1 tablet (5 mg total) by mouth 2 (two) times daily. What changed:  additional instructions   atorvastatin  40 MG tablet Commonly known as:  LIPITOR Take 1 tablet (40 mg total) by mouth daily at 6 PM.   BiDil 20-37.5 MG tablet Generic drug:  isosorbide-hydrALAZINE TAKE 1 TABLET BY MOUTH THREE TIMES A DAY   clopidogrel 75 MG tablet Commonly known as:  PLAVIX Take 1 tablet (75 mg total) by mouth daily with breakfast. Start taking on:  June 23, 2018   cycloSPORINE 0.05 % ophthalmic emulsion Commonly known as:  RESTASIS Place 1 drop into both eyes daily as needed (dry eye).   furosemide 40 MG tablet Commonly known as:  LASIX Take 0.5 tablets (20 mg total) by mouth daily.   metoprolol succinate 50 MG 24 hr tablet Commonly known as:  TOPROL-XL Take 50 mg by mouth daily.   nitroGLYCERIN 0.4 MG SL tablet Commonly known as:  NITROSTAT Place 1 tablet (0.4 mg total) under the tongue every 5 (five) minutes as needed for chest pain. up to 3 doses.         Outstanding Labs/Studies   BMET at follow up appt.  Duration of Discharge Encounter   Greater than 30 minutes including physician time.  Signed, Reino Bellis NP-C 06/22/2018, 12:04 PM

## 2018-06-22 NOTE — Progress Notes (Signed)
CARDIAC REHAB PHASE I   PRE:  Rate/Rhythm: 81 SR    BP: sitting 120/75    SaO2:   MODE:  Ambulation: 470 ft   POST:  Rate/Rhythm: 98 SR    BP: sitting 152/91     SaO2:    Wrote CRPI order for pt with MI. Tolerated amb well. Sts he has slight SOB. No CP. Discussed MI, restrictions, NTG, diet, ex, and CRPII. Discussed importance of meds. Will refer to [facility] CES, ACSM 06/22/2018 1:39 PM

## 2018-06-22 NOTE — Progress Notes (Addendum)
Progress Note  Patient Name: [name] Date of Encounter: 06/22/2018  Primary Cardiologist: Kate Sable, MD   Subjective   No chest pain today.   Inpatient Medications    Scheduled Meds: . atorvastatin  40 mg Oral q1800  . clopidogrel  75 mg Oral Q breakfast  . isosorbide-hydrALAZINE  1 tablet Oral TID  . metoprolol succinate  50 mg Oral Daily  . sodium chloride flush  3 mL Intravenous Once  . sodium chloride flush  3 mL Intravenous Q12H   Continuous Infusions: . sodium chloride    . heparin 1,300 Units/hr (06/22/18 0752)   PRN Meds: sodium chloride, acetaminophen, nitroGLYCERIN, ondansetron (ZOFRAN) IV, perflutren lipid microspheres (DEFINITY) IV suspension, sodium chloride flush   Vital Signs    Vitals:   06/21/18 1510 06/21/18 2135 06/22/18 0349 06/22/18 0823  BP: (!) 158/89 136/73 133/61 137/82  Pulse: 65 62 (!) 58   Resp: 10 13 13    Temp:  98.4 F (36.9 C) 98.5 F (36.9 C)   TempSrc:  Oral Oral   SpO2: 98% 95% 96%   Weight:   100.7 kg   Height:        Intake/Output Summary (Last 24 hours) at 06/22/2018 0858 Last data filed at 06/22/2018 0353 Gross per 24 hour  Intake 376.61 ml  Output 575 ml  Net -198.39 ml   Last 3 Weights 06/22/2018 06/21/2018 06/20/2018  Weight (lbs) 221 lb 14.4 oz 223 lb 8 oz 224 lb  Weight (kg) 100.653 kg 101.379 kg 101.606 kg      Telemetry    SR with short run of NSVT and PVCs - Personally Reviewed  ECG    N/a - Personally Reviewed  Physical Exam   GEN: No acute distress.   Neck: No JVD Cardiac: RRR, no murmurs, rubs, or gallops.  Respiratory: Clear to auscultation bilaterally. GI: Soft, nontender, non-distended  MS: No edema; No deformity. Left radial cath site stable.  Neuro:  Nonfocal  Psych: Normal affect   Labs    Chemistry Recent Labs  Lab 06/20/18 2153 06/21/18 0726 06/22/18 0340  NA 141 139 137  K 3.6 3.8 3.8  CL 105 104 102  CO2 29 27 27   GLUCOSE 144* 109* 111*  BUN 16 11 11    CREATININE 1.39* 1.46* 1.50*  CALCIUM 8.9 8.8* 8.6*  GFRNONAA 50* 47* 46*  GFRAA 58* 55* 53*  ANIONGAP 7 8 8      Hematology Recent Labs  Lab 06/20/18 2153 06/22/18 0340  WBC 6.4 7.1  RBC 5.37 5.53  HGB 15.4 15.5  HCT 49.8 49.2  MCV 92.7 89.0  MCH 28.7 28.0  MCHC 30.9 31.5  RDW 13.3 13.2  PLT 145* 131*    Cardiac Enzymes Recent Labs  Lab 06/20/18 2153 06/21/18 0041 06/21/18 0949  TROPONINI 0.09* 3.20* 16.18*   No results for input(s): TROPIPOC in the last 168 hours.   BNPNo results for input(s): BNP, PROBNP in the last 168 hours.   DDimer No results for input(s): DDIMER in the last 168 hours.   Radiology    Dg Chest 2 View  Result Date: 06/20/2018 CLINICAL DATA:  Chest pain EXAM: CHEST - 2 VIEW COMPARISON:  March 25, 2018 FINDINGS: There is no appreciable edema or consolidation. Heart is upper normal in size with pulmonary vascularity normal. No adenopathy. Patient is status post coronary artery bypass grafting. No pneumothorax. No bone lesions. IMPRESSION: Status post coronary artery bypass grafting. Heart upper normal in size. No edema or  consolidation. Electronically Signed   By: Lowella Grip III M.D.   On: 06/20/2018 21:13    Cardiac Studies   Cath: 06/21/2018  Conclusions: 1. Severe native coronary artery disease, including occluded proximal LAD and LCx. RCA with patent mid/distal stents and mild in-stent restenosis. 2. Widely patent LIMA-LAD, SVG-OM1-OM, and SVG-D. 3. Mildly elevated left ventricular filling pressure.  Recommendations: 1. Medical therapy; question coronary embolism with spontaneous lysis. Plan to restart heparin infusion 2 hours after TR band removal and resumption of apixaban tomorrow AM if no evidence of bleeding. 2. Continue clopidogrel 75 mg daily.  Defer continuing aspirin in the setting of long-term anticoagulation and clopidogrel use. 3. Aggressive secondary prevention.  Nelva Bush, MD [facility]  Echo:  pending  Patient Profile     73 y.o. male w/ a history of CAD with prior CABG, DES to RCA in 07/3327, Chronic systolic HF LVEF 51-88%, apical thrombus, CVA, prior PE, chronic anticoagulation w/ Eliquis, HL, HTN and OSA, transferred from [facility] to [facility] for NSTEMI. Underwent cardiac cath yesterday noted above.   Assessment & Plan    1. NSTEMI w/ known CAD: s/p remote CABG and recent PCI to native RCA 5/19. Troponin peaked at 16.18. Underwent cath yesterday with patent grafts, and patent stents in the RCA. Question thrombus with spontaneous lysis prior to cath?  -- will restart Eliquis, continue BB, Bidil, statin. No ASA.   2. Chronic Systolic HF: EF 41-66% by echo in 08/2017. Allergic to ACE/ARBs (swelling). On Bidil for afterload reduction + ? blocker, metoprolol. Diuretic held for cath. Volume stable on exam.  -- repeat echo pending  3. H/o CVA: supposed to be on Eliquis, but pt believes he is no longer taking? Will resume today.   4. H/o Apical Thrombus: supposed to be on Eliquis, but pt believed he was no longer taking. Will resume Eliquis today.  5. HTN: improved today  6. HLD: continue home statin. LDL 31.  7. NSVT: Improved today. Will continue on current BB dose as he is bradycardiac. Stable electrolytes.   Will send plavix/eliquis to [facility] as he needs education regarding medication changes prior to discharge.   For questions or updates, please contact [place] Please consult [email or web address] for contact info under   Signed, Reino Bellis, NP  06/22/2018, 8:58 AM    Patient seen, examined. Available data reviewed. Agree with findings, assessment, and plan as outlined by Reino Bellis, NP.  The patient is independently interviewed and examined.  He is alert and oriented in no distress.  JVP is normal, carotid upstrokes are normal without bruits, lungs are clear, heart is regular rate and rhythm with no murmurs or gallops, abdomen is soft and nontender,  extremities have no edema.  The left radial access site is clear.  I have reviewed the patient's cardiac catheterization films which demonstrate stable coronary anatomy with no clear culprit lesion for non-STEMI.  I think it is best to place the patient back on apixaban with his history of LV apical thrombus as well as clopidogrel to treat his non-ST elevation infarction.  It is possible that he had an embolic event or a plaque rupture with recanalization prior to going for cardiac catheterization.  He will be treated with aggressive medical therapy as outlined above.  His echocardiogram is repeated and demonstrates no acute changes with LVEF 35 to 40% and segmental wall motion abnormalities noted.  Other issues appear stable.  The patient has an allergic reaction to ACE/ARB.  His  medical program is reviewed and no further changes are recommended.  The patient appears stable for hospital discharge today.    [name], M.D. 06/22/2018 11:35 AM

## 2018-06-22 NOTE — Progress Notes (Signed)
  Echocardiogram 2D Echocardiogram with definity has been performed.  [name] M 06/22/2018, 8:31 AM

## 2018-07-29 ENCOUNTER — Telehealth: Payer: Self-pay | Admitting: Cardiovascular Disease

## 2018-07-29 NOTE — Telephone Encounter (Signed)
Virtual Visit Pre-Appointment Phone Call  "(Name), I am calling you today to discuss your upcoming appointment. We are currently trying to limit exposure to the virus that causes COVID-19 by seeing patients at home rather than in the office."  1. "What is the BEST phone number to call the day of the visit?" - include this in appointment notes  2. Do you have or have access to (through a family member/friend) a smartphone with video capability that we can use for your visit?" a. If yes - list this number in appt notes as cell (if different from BEST phone #) and list the appointment type as a VIDEO visit in appointment notes b. If no - list the appointment type as a PHONE visit in appointment notes  3. Confirm consent - "In the setting of the current Covid19 crisis, you are scheduled for a (phone or video) visit with your provider on (date) at (time).  Just as we do with many in-office visits, in order for you to participate in this visit, we must obtain consent.  If you'd like, I can send this to your mychart (if signed up) or email for you to review.  Otherwise, I can obtain your verbal consent now.  All virtual visits are billed to your insurance company just like a normal visit would be.  By agreeing to a virtual visit, we'd like you to understand that the technology does not allow for your provider to perform an examination, and thus may limit your provider's ability to fully assess your condition. If your provider identifies any concerns that need to be evaluated in person, we will make arrangements to do so.  Finally, though the technology is pretty good, we cannot assure that it will always work on either your or our end, and in the setting of a video visit, we may have to convert it to a phone-only visit.  In either situation, we cannot ensure that we have a secure connection.  Are you willing to proceed?" STAFF: Did the patient verbally acknowledge consent to telehealth visit? Document  YES/NO here: Yes  4. Advise patient to be prepared - "Two hours prior to your appointment, go ahead and check your blood pressure, pulse, oxygen saturation, and your weight (if you have the equipment to check those) and write them all down. When your visit starts, your provider will ask you for this information. If you have an Apple Watch or Kardia device, please plan to have heart rate information ready on the day of your appointment. Please have a pen and paper handy nearby the day of the visit as well."  5. Give patient instructions for MyChart download to smartphone OR [email or web address] as below if video visit (depending on what platform provider is using)  6. Inform patient they will receive a phone call 15 minutes prior to their appointment time (may be from unknown caller ID) so they should be prepared to answer    TELEPHONE CALL NOTE  [name] has been deemed a candidate for a follow-up tele-health visit to limit community exposure during the Covid-19 pandemic. I spoke with the patient via phone to ensure availability of phone/video source, confirm preferred email & phone number, and discuss instructions and expectations.  I reminded [name] to be prepared with any vital sign and/or heart rhythm information that could potentially be obtained via home monitoring, at the time of his visit. I reminded [name] to expect a phone call prior to  his visit.  Orinda Kenner 07/29/2018 12:49 PM

## 2018-07-30 ENCOUNTER — Encounter: Payer: Self-pay | Admitting: Cardiovascular Disease

## 2018-07-30 ENCOUNTER — Telehealth (INDEPENDENT_AMBULATORY_CARE_PROVIDER_SITE_OTHER): Payer: Medicare Other | Admitting: Cardiovascular Disease

## 2018-07-30 VITALS — Ht 66.0 in | Wt 224.0 lb

## 2018-07-30 DIAGNOSIS — I25708 Atherosclerosis of coronary artery bypass graft(s), unspecified, with other forms of angina pectoris: Secondary | ICD-10-CM

## 2018-07-30 DIAGNOSIS — G4733 Obstructive sleep apnea (adult) (pediatric): Secondary | ICD-10-CM

## 2018-07-30 DIAGNOSIS — I10 Essential (primary) hypertension: Secondary | ICD-10-CM

## 2018-07-30 DIAGNOSIS — I5022 Chronic systolic (congestive) heart failure: Secondary | ICD-10-CM

## 2018-07-30 DIAGNOSIS — I429 Cardiomyopathy, unspecified: Secondary | ICD-10-CM

## 2018-07-30 DIAGNOSIS — E785 Hyperlipidemia, unspecified: Secondary | ICD-10-CM

## 2018-07-30 DIAGNOSIS — Z8673 Personal history of transient ischemic attack (TIA), and cerebral infarction without residual deficits: Secondary | ICD-10-CM

## 2018-07-30 DIAGNOSIS — Z955 Presence of coronary angioplasty implant and graft: Secondary | ICD-10-CM

## 2018-07-30 DIAGNOSIS — I513 Intracardiac thrombosis, not elsewhere classified: Secondary | ICD-10-CM

## 2018-07-30 DIAGNOSIS — I252 Old myocardial infarction: Secondary | ICD-10-CM

## 2018-07-30 DIAGNOSIS — R002 Palpitations: Secondary | ICD-10-CM

## 2018-07-30 NOTE — Progress Notes (Signed)
Virtual Visit via Telephone Note   This visit type was conducted due to national recommendations for restrictions regarding the COVID-19 Pandemic (e.g. social distancing) in an effort to limit this patient's exposure and mitigate transmission in our community.  Due to his co-morbid illnesses, this patient is at least at moderate risk for complications without adequate follow up.  This format is felt to be most appropriate for this patient at this time.  The patient did not have access to video technology/had technical difficulties with video requiring transitioning to audio format only (telephone).  All issues noted in this document were discussed and addressed.  No physical exam could be performed with this format.  Please refer to the patient's chart for his  consent to telehealth for [facility].   Evaluation Performed:  Follow-up visit  Date:  07/30/2018   ID:  [name], [name] Nov 12, 1945, MRN [id number]  Patient Location: Home Provider Location: Office  PCP:  Rosita Fire, MD  Cardiologist:  Kate Sable, MD  Electrophysiologist:  None   Chief Complaint:  CAD  History of Present Illness:    [name] is a 73 y.o. male with coronary artery disease.  He recently sustained a non-STEMI and underwent cardiac catheterization on 06/21/2018.  This demonstrated severe native coronary artery disease including occluded proximal LAD and left circumflex.  The RCA showed stent patency in the mid to distal region and mild in-stent restenosis.  The LIMA to LAD, sequential SVG to OM1-OM2, and SVG to diagonal were all widely patent.  LVEDP was mildly elevated.  There was a question as to whether or not an intracoronary embolism demonstrated spontaneous lysis.  Aggressive secondary prevention was recommended.  Echocardiogram on 06/22/2018 demonstrated moderately reduced left ventricular systolic function, LVEF 35 to 40%.  There were wall motion abnormalities.  Contrast was used and  demonstrated scar.  There was no thrombus seen.  He is doing well and denies chest pain, palpitations, shortness of breath, fevers, and bleeding problems.  He has been walking regularly.  The patient does not have symptoms concerning for COVID-19 infection (fever, chills, cough, or new shortness of breath).    Past Medical History:  Diagnosis Date  . Apical mural thrombus   . Arteriosclerotic cardiovascular disease (ASCVD) 1998   Inferior MI->PCI of the RCA in 1998; acute anterior MI in 06/2002->  PCI of RCA and LAD with DES x2 to RCA, residual 80% ostial D1, and 80% mid CX and EF-40%; CABG-2011, LIMA-LAD, SVG to D1, OM1 & OM2; EF of 35-40% in 07/2010  . Chronic anticoagulation   . Chronic systolic CHF (congestive heart failure) ([place])   . CVA (cerebral vascular accident) ([place])   . HTN (hypertension)   . Hyperlipidemia   . Keloid    median sternotomy  . Pulmonary embolism ([place]) 03/2010  . 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 COLONOSCOPY N/A 01/22/2017   Procedure: COLONOSCOPY;  Surgeon: Danie Binder, MD;  [facility];  Service: Endoscopy;  Laterality: N/A;  200  . CORONARY ARTERY BYPASS GRAFT  03/18/2010   LIMA-LAD, SVG to diagonal, OM1 & OM2  . CORONARY STENT INTERVENTION N/A 08/21/2017   Procedure: CORONARY STENT INTERVENTION;  Surgeon: [name], MD;  Location: [place] CV LAB;  Service: Cardiovascular;  Laterality: N/A;  . LEFT HEART CATH AND CORS/GRAFTS ANGIOGRAPHY N/A 08/21/2017   Procedure: LEFT HEART CATH AND CORS/GRAFTS ANGIOGRAPHY;  Surgeon: Burt Knack,  Legrand Como, MD;  Location: [place] CV LAB;  Service: Cardiovascular;  Laterality: N/A;  . LEFT HEART CATH AND CORS/GRAFTS ANGIOGRAPHY N/A 06/21/2018   Procedure: LEFT HEART CATH AND CORS/GRAFTS ANGIOGRAPHY;  Surgeon: Nelva Bush, MD;  Location: [place] CV LAB;  Service: Cardiovascular;  Laterality: N/A;  . POLYPECTOMY  01/22/2017    Procedure: POLYPECTOMY;  Surgeon: Danie Binder, MD;  [facility];  Service: Endoscopy;;  Transverse(CS) and sigmoid colon(HS)  . PTCA  06/1996   LAD & RCA  . TEE WITHOUT CARDIOVERSION N/A 06/10/2012   Procedure: TRANSESOPHAGEAL ECHOCARDIOGRAM (TEE);  Surgeon: Josue Hector, MD;  Location: [facility];  Service: Cardiovascular;  Laterality: N/A;     Current Meds  Medication Sig  . apixaban (ELIQUIS) 5 MG TABS tablet Take 1 tablet (5 mg total) by mouth 2 (two) times daily.  Marland Kitchen atorvastatin (LIPITOR) 40 MG tablet Take 1 tablet (40 mg total) by mouth daily at 6 PM.  . BIDIL 20-37.5 MG tablet TAKE 1 TABLET BY MOUTH THREE TIMES A DAY (Patient taking differently: Take 1 tablet by mouth 3 (three) times daily. )  . clopidogrel (PLAVIX) 75 MG tablet Take 1 tablet (75 mg total) by mouth daily with breakfast.  . cycloSPORINE (RESTASIS) 0.05 % ophthalmic emulsion Place 1 drop into both eyes daily as needed (dry eye).   . furosemide (LASIX) 40 MG tablet Take 0.5 tablets (20 mg total) by mouth daily.  . metoprolol succinate (TOPROL-XL) 50 MG 24 hr tablet Take 50 mg by mouth daily.  . nitroGLYCERIN (NITROSTAT) 0.4 MG SL tablet Place 1 tablet (0.4 mg total) under the tongue every 5 (five) minutes as needed for chest pain. up to 3 doses.     Allergies:   Lisinopril   Social History   Tobacco Use  . Smoking status: Former Smoker    Last attempt to quit: 2004    Years since quitting: 16.3  . Smokeless tobacco: Never Used  Substance Use Topics  . Alcohol use: Yes    Comment: rare  . Drug use: No    Types: Cocaine    Comment: Former cocaine abuse - last use for 51th birthday     Family Hx: The patient's family history includes Cancer (age of onset: 36) in his mother.  ROS:   Please see the history of present illness.      All other systems reviewed and are negative.   Prior CV studies:   The following studies were reviewed today:  Echocardiogram and cardiac catheterization  results reviewed above.  Labs/Other Tests and Data Reviewed:    EKG:  No ECG reviewed.  Recent Labs: 09/23/2017: B Natriuretic Peptide 79.0 03/25/2018: Magnesium 1.9; TSH 1.930 06/22/2018: BUN 11; Creatinine, Ser 1.50; Hemoglobin 15.5; Platelets 131; Potassium 3.8; Sodium 137   Recent Lipid Panel Lab Results  Component Value Date/Time   CHOL 79 06/22/2018 03:40 AM   TRIG 38 06/22/2018 03:40 AM   HDL 40 (L) 06/22/2018 03:40 AM   CHOLHDL 2.0 06/22/2018 03:40 AM   LDLCALC 31 06/22/2018 03:40 AM    Wt Readings from Last 3 Encounters:  07/30/18 224 lb (101.6 kg)  06/22/18 221 lb 14.4 oz (100.7 kg)  03/26/18 225 lb (102.1 kg)     Objective:    Vital Signs:  Ht 5\' 6"  (1.676 m)   Wt 224 lb (101.6 kg)   BMI 36.15 kg/m    VITAL SIGNS:  reviewed  ASSESSMENT & PLAN:    1. CAD  with CABGstatus post drug-eluting stent placement to the HGD:JMEQAS ischemic heart disease.  Non-STEMI in March 2020 with cardiac catheterization results detailed above.  Continue metoprololsuccinateandatorvastatin 40 mg. Allergic to ACE inhibitors/angiotensin receptor blockers. No aspirin as he is on Plavix.  Given the nature of recent non-STEMI, I may continue Plavix indefinitely.  2. Hyperlipidemia: Lipid panel from 06/22/2018 reviewed with an LDL of 31. ContinueLipitor 40 mg.  3. H/o TMH:DQQIWLNLG on Eliquis 5 mg twice daily as he has a history of cerebral thrombosis.  4. Essential XQJ:JHERDEYCXKGYJ 20/37.5 mg 3 times daily.  5. Chronic systolic heart failure, LVEF 35 to 40%: Currently on Lasix20 mg daily and Toprol-XL 50 mg daily. Allergic to ACE inhibitors/angiotensin receptor blockers.I willcontinueBiDil 20/37.5 mg 3 times daily.   6.Apical thrombus: Currently on Eliquis 5 mg twice daily(also with a history of cerebral thrombosis).No apical thrombus seen on most recent echocardiogram in March 2020 as detailed above.  7.  Palpitations: Symptomatically stable.  Event  monitoring showed PACs and PVCs with nonsustained ventricular tachycardia.  Continue Toprol-XL.  8.  Obstructive sleep apnea: Not using CPAP.  I encouraged compliance.      COVID-19 Education: The signs and symptoms of COVID-19 were discussed with the patient and how to seek care for testing (follow up with PCP or arrange E-visit).  The importance of social distancing was discussed today.  Time:   Today, I have spent 25 minutes with the patient with telehealth technology discussing the above problems.     Medication Adjustments/Labs and Tests Ordered: Current medicines are reviewed at length with the patient today.  Concerns regarding medicines are outlined above.   Tests Ordered: No orders of the defined types were placed in this encounter.   Medication Changes: No orders of the defined types were placed in this encounter.   Disposition:  Follow up in 6 month(s)  Signed, Kate Sable, MD  07/30/2018 1:12 PM    [facility]

## 2018-07-30 NOTE — Patient Instructions (Signed)
Medication Instructions: Your physician recommends that you continue on your current medications as directed. Please refer to the Current Medication list given to you today.   Labwork: None today  Procedures/Testing: None today  Follow-Up: 6 months with Dr.Koneswaran  Any Additional Special Instructions Will Be Listed Below (If Applicable).     If you need a refill on your cardiac medications before your next appointment, please call your pharmacy.     Thank you for choosing [facility] !

## 2018-08-04 IMAGING — DX DG CHEST 2V
2 series · 2 of 2 positions shown · non-contrast
Comparison: 03/08/2017

CLINICAL DATA: Dyspnea tonight.

EXAM:
CHEST  2 VIEW

[chest pa]
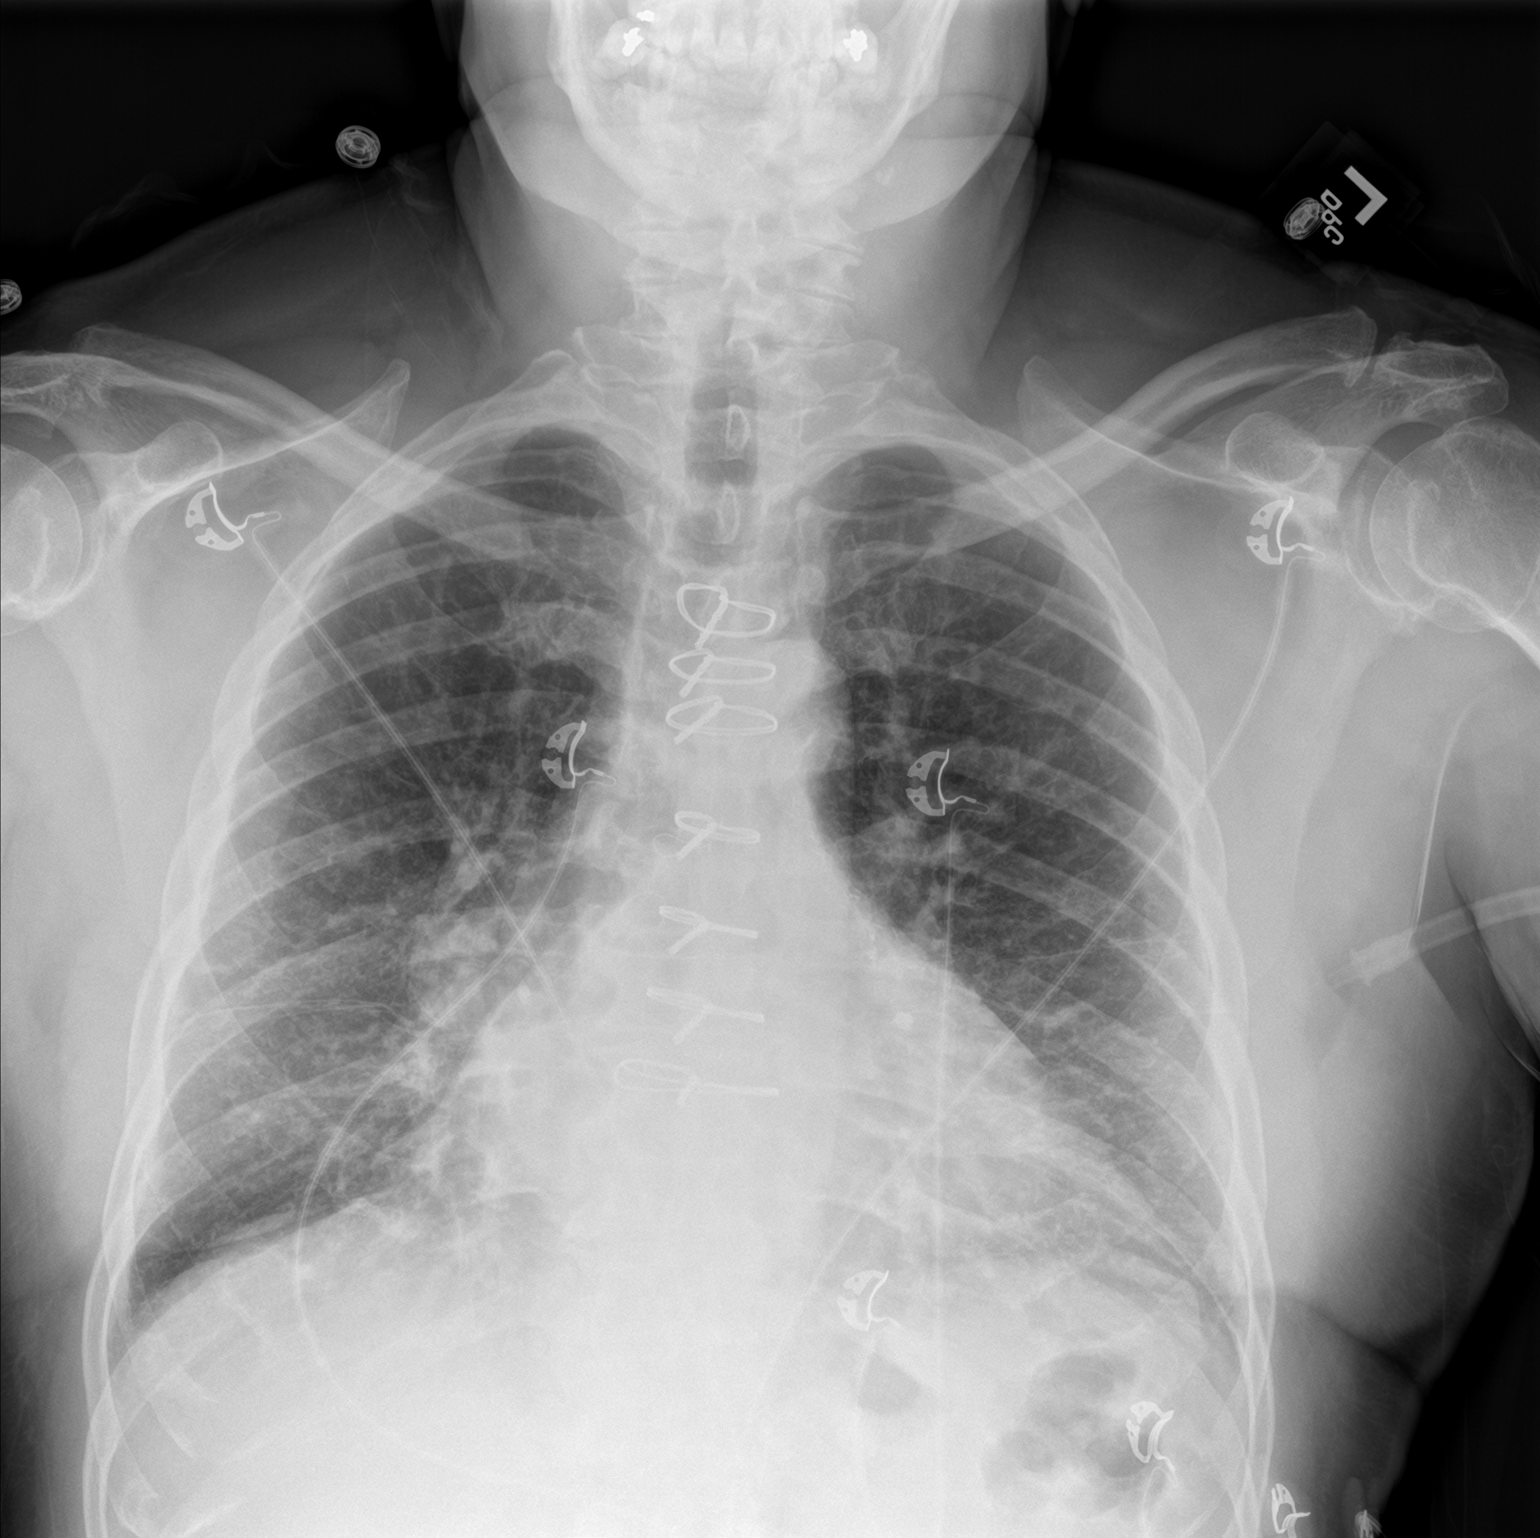

[chest lat]
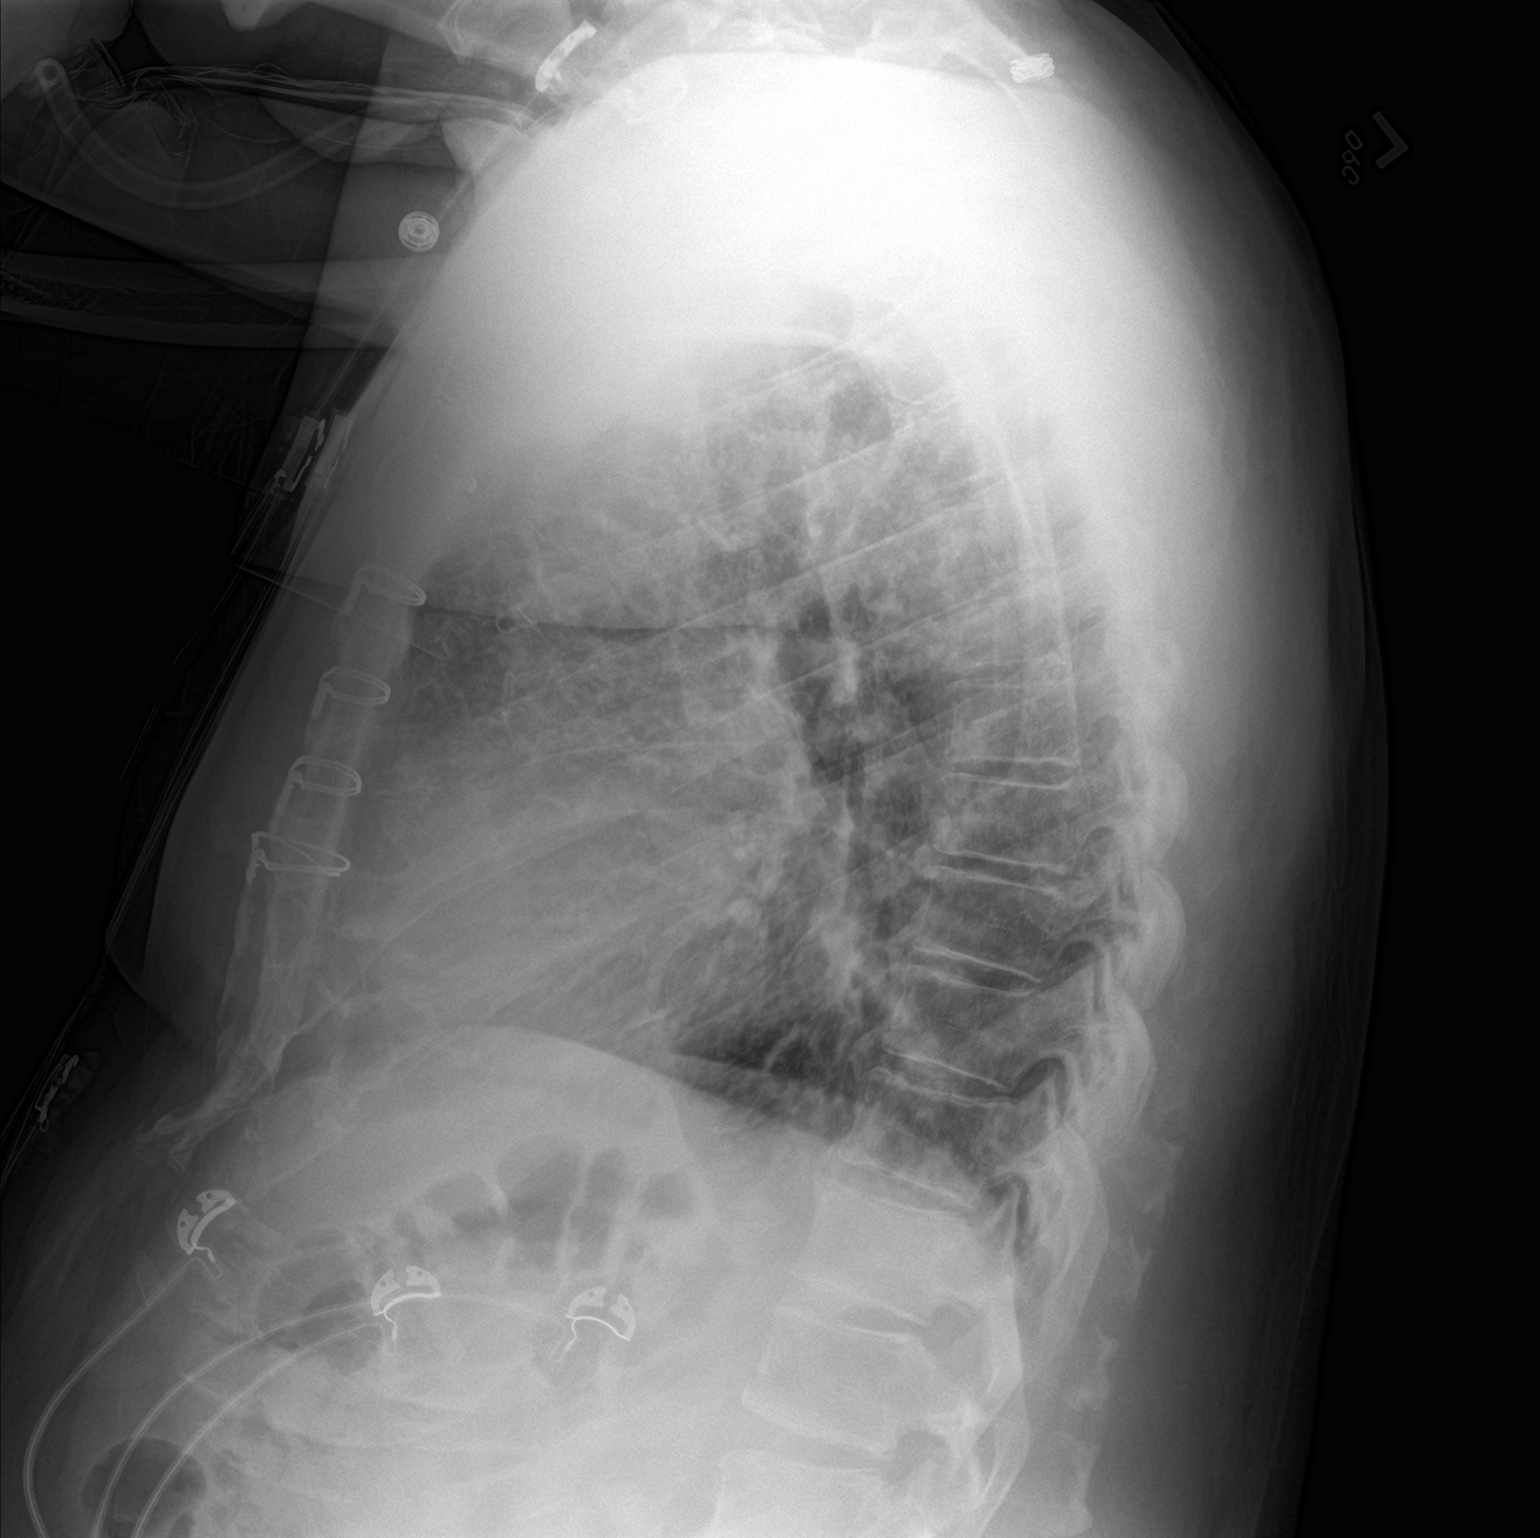

[2 of 2 positions shown; findings below may reference images not displayed]

FINDINGS: Mild hyperinflation. Moderate cardiomegaly, unchanged. No airspace
consolidation. No effusions. Mild interstitial coarsening.
IMPRESSION: Hyperinflation and cardiomegaly.  No consolidation.  No effusions.

## 2018-11-10 ENCOUNTER — Encounter (HOSPITAL_COMMUNITY): Payer: Self-pay | Admitting: Emergency Medicine

## 2018-11-10 ENCOUNTER — Inpatient Hospital Stay (HOSPITAL_COMMUNITY)
Admission: EM | Admit: 2018-11-10 | Discharge: 2018-11-12 | DRG: 291 | Disposition: A | Discharge status: Home Health | Payer: Medicare Other | Attending: Internal Medicine | Admitting: Internal Medicine

## 2018-11-10 ENCOUNTER — Emergency Department (HOSPITAL_COMMUNITY): Payer: Medicare Other

## 2018-11-10 ENCOUNTER — Other Ambulatory Visit: Payer: Self-pay

## 2018-11-10 DIAGNOSIS — Z79899 Other long term (current) drug therapy: Secondary | ICD-10-CM

## 2018-11-10 DIAGNOSIS — I10 Essential (primary) hypertension: Secondary | ICD-10-CM

## 2018-11-10 DIAGNOSIS — J9601 Acute respiratory failure with hypoxia: Secondary | ICD-10-CM | POA: Diagnosis present

## 2018-11-10 DIAGNOSIS — I5043 Acute on chronic combined systolic (congestive) and diastolic (congestive) heart failure: Secondary | ICD-10-CM | POA: Diagnosis present

## 2018-11-10 DIAGNOSIS — Z9981 Dependence on supplemental oxygen: Secondary | ICD-10-CM

## 2018-11-10 DIAGNOSIS — I5023 Acute on chronic systolic (congestive) heart failure: Secondary | ICD-10-CM

## 2018-11-10 DIAGNOSIS — I130 Hypertensive heart and chronic kidney disease with heart failure and stage 1 through stage 4 chronic kidney disease, or unspecified chronic kidney disease: Principal | ICD-10-CM | POA: Diagnosis present

## 2018-11-10 DIAGNOSIS — I5022 Chronic systolic (congestive) heart failure: Secondary | ICD-10-CM

## 2018-11-10 DIAGNOSIS — I252 Old myocardial infarction: Secondary | ICD-10-CM | POA: Diagnosis not present

## 2018-11-10 DIAGNOSIS — F1411 Cocaine abuse, in remission: Secondary | ICD-10-CM | POA: Diagnosis present

## 2018-11-10 DIAGNOSIS — Z955 Presence of coronary angioplasty implant and graft: Secondary | ICD-10-CM

## 2018-11-10 DIAGNOSIS — N1831 Chronic kidney disease, stage 3a: Secondary | ICD-10-CM | POA: Diagnosis present

## 2018-11-10 DIAGNOSIS — U071 COVID-19: Secondary | ICD-10-CM | POA: Diagnosis present

## 2018-11-10 DIAGNOSIS — E785 Hyperlipidemia, unspecified: Secondary | ICD-10-CM | POA: Diagnosis present

## 2018-11-10 DIAGNOSIS — I255 Ischemic cardiomyopathy: Secondary | ICD-10-CM | POA: Diagnosis present

## 2018-11-10 DIAGNOSIS — Z951 Presence of aortocoronary bypass graft: Secondary | ICD-10-CM | POA: Diagnosis not present

## 2018-11-10 DIAGNOSIS — I2510 Atherosclerotic heart disease of native coronary artery without angina pectoris: Secondary | ICD-10-CM | POA: Diagnosis present

## 2018-11-10 DIAGNOSIS — Z7901 Long term (current) use of anticoagulants: Secondary | ICD-10-CM | POA: Diagnosis not present

## 2018-11-10 DIAGNOSIS — N183 Chronic kidney disease, stage 3 unspecified: Secondary | ICD-10-CM | POA: Diagnosis present

## 2018-11-10 DIAGNOSIS — R0602 Shortness of breath: Secondary | ICD-10-CM

## 2018-11-10 DIAGNOSIS — Z86711 Personal history of pulmonary embolism: Secondary | ICD-10-CM

## 2018-11-10 DIAGNOSIS — J1289 Other viral pneumonia: Secondary | ICD-10-CM | POA: Diagnosis present

## 2018-11-10 DIAGNOSIS — Z888 Allergy status to other drugs, medicaments and biological substances status: Secondary | ICD-10-CM | POA: Diagnosis not present

## 2018-11-10 DIAGNOSIS — G4733 Obstructive sleep apnea (adult) (pediatric): Secondary | ICD-10-CM | POA: Diagnosis present

## 2018-11-10 DIAGNOSIS — Z8673 Personal history of transient ischemic attack (TIA), and cerebral infarction without residual deficits: Secondary | ICD-10-CM

## 2018-11-10 DIAGNOSIS — Z7902 Long term (current) use of antithrombotics/antiplatelets: Secondary | ICD-10-CM | POA: Diagnosis not present

## 2018-11-10 DIAGNOSIS — E782 Mixed hyperlipidemia: Secondary | ICD-10-CM | POA: Diagnosis present

## 2018-11-10 DIAGNOSIS — J189 Pneumonia, unspecified organism: Secondary | ICD-10-CM

## 2018-11-10 DIAGNOSIS — J1282 Pneumonia due to coronavirus disease 2019: Secondary | ICD-10-CM | POA: Diagnosis present

## 2018-11-10 LAB — CBC WITH DIFFERENTIAL/PLATELET
Abs Immature Granulocytes: 0.02 10*3/uL (ref 0.00–0.07)
Basophils Absolute: 0.1 10*3/uL (ref 0.0–0.1)
Basophils Relative: 1 %
Eosinophils Absolute: 0.1 10*3/uL (ref 0.0–0.5)
Eosinophils Relative: 1 %
HCT: 45.5 % (ref 39.0–52.0)
Hemoglobin: 14.4 g/dL (ref 13.0–17.0)
Immature Granulocytes: 0 %
Lymphocytes Relative: 30 %
Lymphs Abs: 2 10*3/uL (ref 0.7–4.0)
MCH: 28.1 pg (ref 26.0–34.0)
MCHC: 31.6 g/dL (ref 30.0–36.0)
MCV: 88.7 fL (ref 80.0–100.0)
Monocytes Absolute: 0.6 10*3/uL (ref 0.1–1.0)
Monocytes Relative: 10 %
Neutro Abs: 3.8 10*3/uL (ref 1.7–7.7)
Neutrophils Relative %: 58 %
Platelets: 251 10*3/uL (ref 150–400)
RBC: 5.13 MIL/uL (ref 4.22–5.81)
RDW: 12.8 % (ref 11.5–15.5)
WBC: 6.6 10*3/uL (ref 4.0–10.5)
nRBC: 0 % (ref 0.0–0.2)

## 2018-11-10 LAB — URINALYSIS, ROUTINE W REFLEX MICROSCOPIC
Bacteria, UA: NONE SEEN
Bilirubin Urine: NEGATIVE
Glucose, UA: NEGATIVE mg/dL
Hgb urine dipstick: NEGATIVE
Ketones, ur: NEGATIVE mg/dL
Leukocytes,Ua: NEGATIVE
Nitrite: NEGATIVE
Protein, ur: 100 mg/dL — AB
Specific Gravity, Urine: 1.024 (ref 1.005–1.030)
pH: 5 (ref 5.0–8.0)

## 2018-11-10 LAB — COMPREHENSIVE METABOLIC PANEL
ALT: 54 U/L — ABNORMAL HIGH (ref 0–44)
AST: 44 U/L — ABNORMAL HIGH (ref 15–41)
Albumin: 3.3 g/dL — ABNORMAL LOW (ref 3.5–5.0)
Alkaline Phosphatase: 47 U/L (ref 38–126)
Anion gap: 9 (ref 5–15)
BUN: 17 mg/dL (ref 8–23)
CO2: 24 mmol/L (ref 22–32)
Calcium: 8.5 mg/dL — ABNORMAL LOW (ref 8.9–10.3)
Chloride: 105 mmol/L (ref 98–111)
Creatinine, Ser: 1.32 mg/dL — ABNORMAL HIGH (ref 0.61–1.24)
GFR calc Af Amer: >60 mL/min (ref >60)
GFR calc non Af Amer: 54 mL/min — ABNORMAL LOW (ref >60)
Glucose, Bld: 110 mg/dL — ABNORMAL HIGH (ref 70–99)
Potassium: 3.5 mmol/L (ref 3.5–5.1)
Sodium: 138 mmol/L (ref 135–145)
Total Bilirubin: 1.7 mg/dL — ABNORMAL HIGH (ref 0.3–1.2)
Total Protein: 7.3 g/dL (ref 6.5–8.1)

## 2018-11-10 LAB — RAPID URINE DRUG SCREEN, HOSP PERFORMED
Amphetamines: NOT DETECTED
Barbiturates: NOT DETECTED
Benzodiazepines: NOT DETECTED
Cocaine: NOT DETECTED
Opiates: NOT DETECTED
Tetrahydrocannabinol: NOT DETECTED

## 2018-11-10 LAB — SEDIMENTATION RATE: Sed Rate: 27 mm/hr — ABNORMAL HIGH (ref 0–16)

## 2018-11-10 LAB — BRAIN NATRIURETIC PEPTIDE: B Natriuretic Peptide: 150 pg/mL — ABNORMAL HIGH (ref 0.0–100.0)

## 2018-11-10 LAB — LACTIC ACID, PLASMA: Lactic Acid, Venous: 1.1 mmol/L (ref 0.5–1.9)

## 2018-11-10 LAB — D-DIMER, QUANTITATIVE: D-Dimer, Quant: 0.74 ug/mL-FEU — ABNORMAL HIGH (ref 0.00–0.50)

## 2018-11-10 LAB — C-REACTIVE PROTEIN: CRP: 3.9 mg/dL — ABNORMAL HIGH (ref <1.0)

## 2018-11-10 LAB — MAGNESIUM: Magnesium: 2.2 mg/dL (ref 1.7–2.4)

## 2018-11-10 LAB — SARS CORONAVIRUS 2 BY RT PCR (HOSPITAL ORDER, PERFORMED IN ~~LOC~~ HOSPITAL LAB): SARS Coronavirus 2: POSITIVE — AB

## 2018-11-10 LAB — TROPONIN I (HIGH SENSITIVITY)
Troponin I (High Sensitivity): 7 ng/L (ref <18)
Troponin I (High Sensitivity): 5 ng/L (ref <18)

## 2018-11-10 LAB — PROCALCITONIN: Procalcitonin: <0.1 ng/mL

## 2018-11-10 MED ORDER — AZITHROMYCIN 250 MG PO TABS: 250.0000 mg | ORAL_TABLET | Freq: Every day | ORAL | Status: DC

## 2018-11-10 MED ORDER — MAGNESIUM SULFATE IN D5W 1-5 GM/100ML-% IV SOLN
1.0000 g | Freq: Once | INTRAVENOUS | Status: AC
  Administered 2018-11-10: 22:00:00 1 g via INTRAVENOUS
  Filled 2018-11-10: qty 100

## 2018-11-10 MED ORDER — FUROSEMIDE 10 MG/ML IJ SOLN
40.0000 mg | Freq: Once | INTRAMUSCULAR | Status: AC
  Administered 2018-11-10: 21:00:00 40 mg via INTRAVENOUS
  Filled 2018-11-10: qty 4

## 2018-11-10 MED ORDER — ZOLPIDEM TARTRATE 5 MG PO TABS: 5.0000 mg | ORAL_TABLET | Freq: Every evening | ORAL | Status: DC | PRN

## 2018-11-10 MED ORDER — ACETAMINOPHEN 325 MG PO TABS: 650.0000 mg | ORAL_TABLET | ORAL | Status: DC | PRN

## 2018-11-10 MED ORDER — ATORVASTATIN CALCIUM 40 MG PO TABS
40.0000 mg | ORAL_TABLET | Freq: Every day | ORAL | Status: DC
  Administered 2018-11-10 – 2018-11-11 (×2): 40 mg via ORAL
  Filled 2018-11-10 (×2): qty 1

## 2018-11-10 MED ORDER — ENSURE ENLIVE PO LIQD
237.0000 mL | Freq: Two times a day (BID) | ORAL | Status: DC
  Administered 2018-11-11 (×2): 237 mL via ORAL

## 2018-11-10 MED ORDER — POTASSIUM CHLORIDE CRYS ER 20 MEQ PO TBCR
40.0000 meq | EXTENDED_RELEASE_TABLET | Freq: Once | ORAL | Status: AC
  Administered 2018-11-10: 40 meq via ORAL
  Filled 2018-11-10: qty 2

## 2018-11-10 MED ORDER — POTASSIUM CHLORIDE CRYS ER 20 MEQ PO TBCR: 20.0000 meq | EXTENDED_RELEASE_TABLET | Freq: Two times a day (BID) | ORAL | Status: DC

## 2018-11-10 MED ORDER — AZITHROMYCIN 250 MG PO TABS
500.0000 mg | ORAL_TABLET | Freq: Every day | ORAL | Status: AC
  Administered 2018-11-10: 13:00:00 500 mg via ORAL
  Filled 2018-11-10: qty 2

## 2018-11-10 MED ORDER — CLOPIDOGREL BISULFATE 75 MG PO TABS
75.0000 mg | ORAL_TABLET | Freq: Every day | ORAL | Status: DC
  Administered 2018-11-11 – 2018-11-12 (×2): 75 mg via ORAL
  Filled 2018-11-10 (×2): qty 1

## 2018-11-10 MED ORDER — SODIUM CHLORIDE 0.9 % IV SOLN: 250.0000 mL | INTRAVENOUS | Status: DC | PRN

## 2018-11-10 MED ORDER — NITROGLYCERIN 0.4 MG SL SUBL: 0.4000 mg | SUBLINGUAL_TABLET | SUBLINGUAL | Status: DC | PRN

## 2018-11-10 MED ORDER — SODIUM CHLORIDE 0.9% FLUSH
3.0000 mL | Freq: Two times a day (BID) | INTRAVENOUS | Status: DC
  Administered 2018-11-10 – 2018-11-12 (×4): 3 mL via INTRAVENOUS

## 2018-11-10 MED ORDER — ALBUTEROL SULFATE HFA 108 (90 BASE) MCG/ACT IN AERS: 2.0000 | INHALATION_SPRAY | Freq: Four times a day (QID) | RESPIRATORY_TRACT | Status: DC | PRN

## 2018-11-10 MED ORDER — SODIUM CHLORIDE 0.9 % IV SOLN
1.0000 g | Freq: Once | INTRAVENOUS | Status: AC
  Administered 2018-11-10: 12:00:00 1 g via INTRAVENOUS
  Filled 2018-11-10: qty 10

## 2018-11-10 MED ORDER — METHYLPREDNISOLONE SODIUM SUCC 125 MG IJ SOLR
125.0000 mg | Freq: Once | INTRAMUSCULAR | Status: AC
  Administered 2018-11-10: 14:00:00 125 mg via INTRAVENOUS
  Filled 2018-11-10: qty 2

## 2018-11-10 MED ORDER — FUROSEMIDE 10 MG/ML IJ SOLN
40.0000 mg | Freq: Once | INTRAMUSCULAR | Status: AC
  Administered 2018-11-10: 40 mg via INTRAVENOUS
  Filled 2018-11-10: qty 4

## 2018-11-10 MED ORDER — SODIUM CHLORIDE 0.9% FLUSH: 3.0000 mL | INTRAVENOUS | Status: DC | PRN

## 2018-11-10 MED ORDER — FUROSEMIDE 10 MG/ML IJ SOLN: 40.0000 mg | Freq: Every day | INTRAMUSCULAR | Status: DC

## 2018-11-10 MED ORDER — ISOSORB DINITRATE-HYDRALAZINE 20-37.5 MG PO TABS
1.0000 | ORAL_TABLET | Freq: Two times a day (BID) | ORAL | Status: DC
  Administered 2018-11-10 – 2018-11-12 (×4): 1 via ORAL
  Filled 2018-11-10 (×6): qty 1

## 2018-11-10 MED ORDER — APIXABAN 5 MG PO TABS
5.0000 mg | ORAL_TABLET | Freq: Two times a day (BID) | ORAL | Status: DC
  Administered 2018-11-10 – 2018-11-12 (×4): 5 mg via ORAL
  Filled 2018-11-10 (×4): qty 1

## 2018-11-10 MED ORDER — ONDANSETRON HCL 4 MG/2ML IJ SOLN: 4.0000 mg | Freq: Four times a day (QID) | INTRAMUSCULAR | Status: DC | PRN

## 2018-11-10 MED ORDER — METOPROLOL SUCCINATE ER 25 MG PO TB24
50.0000 mg | ORAL_TABLET | Freq: Every day | ORAL | Status: DC
  Administered 2018-11-11 – 2018-11-12 (×2): 50 mg via ORAL
  Filled 2018-11-10 (×2): qty 2

## 2018-11-10 MED ORDER — METHYLPREDNISOLONE SODIUM SUCC 125 MG IJ SOLR
60.0000 mg | Freq: Two times a day (BID) | INTRAMUSCULAR | Status: DC
  Administered 2018-11-11 – 2018-11-12 (×3): 60 mg via INTRAVENOUS
  Filled 2018-11-10 (×3): qty 2

## 2018-11-10 NOTE — Progress Notes (Signed)
Received report. Questions answered. Awaiting transport to [place].

## 2018-11-10 NOTE — ED Provider Notes (Signed)
[facility] EMERGENCY DEPARTMENT Provider Note   CSN: [id number] Arrival date & time: 11/10/18  8756     History   Chief Complaint Chief Complaint  Patient presents with   Shortness of Breath    HPI [name] is a 73 y.o. male.     Patient is a 73 year old male who presents to the emergency department with complaint of shortness of breath.  Patient states that he has some problems with shortness of breath from time to time, but in the last 3 days this is been getting worse.  He says he has easy fatigue and gets easily shortness of breath when doing simple activities around the house or when he goes for walks.  He says he walks almost every day, and now he has to stop because he feels winded.  He also says that during that time he has some back pain right side more than left that seems to get worse as he says he walks in his he feels short of breath.  He sleeps most of the time in a recliner.  He says occasionally he can lay down flat but mostly he sits up in the recliner.  He does not use home O2.  He has not had fever or chills.  He is not had any hemoptysis.  He has not been exposed to anyone with known diagnosis of the COVID-19 virus.  He has not been traveling recently.  It is of note that the patient has coronary artery disease.  He is required coronary artery bypass grafting surgery and stent placement following the initial surgery.  He has suffered a cerebrovascular accident.  Has a history of hypertension.  He is on chronic anticoagulation with Eliquis.  He has a history of chronic congestive heart failure.  He has a cardiomyopathy.  His ejection fraction is 35 to 40%.  The history is provided by the patient.    Past Medical History:  Diagnosis Date   Apical mural thrombus    Arteriosclerotic cardiovascular disease (ASCVD) 1998   Inferior MI->PCI of the RCA in 1998; acute anterior MI in 06/2002->  PCI of RCA and LAD with DES x2 to RCA, residual 80% ostial D1, and 80% mid  CX and EF-40%; CABG-2011, LIMA-LAD, SVG to D1, OM1 & OM2; EF of 35-40% in 07/2010   Chronic anticoagulation    Chronic systolic CHF (congestive heart failure) (HCC)    CVA (cerebral vascular accident) ([place])    HTN (hypertension)    Hyperlipidemia    Keloid    median sternotomy   Pulmonary embolism ([place]) 03/2010   Substance abuse ([place])    formerly cocaine    Patient Active Problem List   Diagnosis Date Noted   NSTEMI (non-ST elevated myocardial infarction) ([place]) 06/21/2018   CHF (congestive heart failure) ([place]) 43/32/9518   Chronic systolic heart failure ([place]) 08/21/2017   Abnormal nuclear stress test    CHF exacerbation ([place]) 03/20/2017   Cerebral thrombosis with cerebral infarction 03/08/2017   TIA (transient ischemic attack) 03/08/2017   Visual field defect of right eye 03/07/2017   CKD (chronic kidney disease), stage III ([place]) 03/07/2017   Hyperglycemia 03/07/2017   OSA (obstructive sleep apnea) 03/07/2017   Special screening for malignant neoplasms, colon    Central sleep apnea 11/01/2012   Chronic anticoagulation-discontinued 10/19/2012   Cerebral infarction ([place]) 06/09/2012   Arteriosclerotic cardiovascular disease (ASCVD)    Pulmonary embolism ([place]) 03/07/2010   Hyperlipidemia 11/24/2008   Essential hypertension 11/24/2008  Past Surgical History:  Procedure Laterality Date   COLONOSCOPY N/A 01/22/2017   Procedure: COLONOSCOPY;  Surgeon: Danie Binder, MD;  [facility];  Service: Endoscopy;  Laterality: N/A;  200   CORONARY ARTERY BYPASS GRAFT  03/18/2010   LIMA-LAD, SVG to diagonal, OM1 & OM2   CORONARY STENT INTERVENTION N/A 08/21/2017   Procedure: CORONARY STENT INTERVENTION;  Surgeon: [name], MD;  Location: [place] CV LAB;  Service: Cardiovascular;  Laterality: N/A;   LEFT HEART CATH AND CORS/GRAFTS ANGIOGRAPHY N/A 08/21/2017   Procedure: LEFT HEART CATH AND CORS/GRAFTS ANGIOGRAPHY;  Surgeon: [name], MD;  Location: [place] CV LAB;  Service: Cardiovascular;  Laterality: N/A;   LEFT HEART CATH AND CORS/GRAFTS ANGIOGRAPHY N/A 06/21/2018   Procedure: LEFT HEART CATH AND CORS/GRAFTS ANGIOGRAPHY;  Surgeon: Nelva Bush, MD;  Location: [place] CV LAB;  Service: Cardiovascular;  Laterality: N/A;   POLYPECTOMY  01/22/2017   Procedure: POLYPECTOMY;  Surgeon: Danie Binder, MD;  [facility];  Service: Endoscopy;;  Transverse(CS) and sigmoid colon(HS)   PTCA  06/1996   LAD & RCA   TEE WITHOUT CARDIOVERSION N/A 06/10/2012   Procedure: TRANSESOPHAGEAL ECHOCARDIOGRAM (TEE);  Surgeon: Josue Hector, MD;  [facility];  Service: Cardiovascular;  Laterality: N/A;        Home Medications    Prior to Admission medications   Medication Sig Start Date End Date Taking? Authorizing Provider  apixaban (ELIQUIS) 5 MG TABS tablet Take 1 tablet (5 mg total) by mouth 2 (two) times daily. 06/22/18   Minus Breeding, MD  atorvastatin (LIPITOR) 40 MG tablet Take 1 tablet (40 mg total) by mouth daily at 6 PM. 03/09/17   Rizwan, Eunice Blase, MD  BIDIL 20-37.5 MG tablet TAKE 1 TABLET BY MOUTH THREE TIMES A DAY Patient taking differently: Take 1 tablet by mouth 3 (three) times daily.  02/02/18   Herminio Commons, MD  clopidogrel (PLAVIX) 75 MG tablet Take 1 tablet (75 mg total) by mouth daily with breakfast. 06/23/18   Minus Breeding, MD  cycloSPORINE (RESTASIS) 0.05 % ophthalmic emulsion Place 1 drop into both eyes daily as needed (dry eye).     [provider]  furosemide (LASIX) 40 MG tablet Take 0.5 tablets (20 mg total) by mouth daily. 06/22/18   Cheryln Manly, NP  metoprolol succinate (TOPROL-XL) 50 MG 24 hr tablet Take 50 mg by mouth daily. 04/27/17   [provider]  nitroGLYCERIN (NITROSTAT) 0.4 MG SL tablet Place 1 tablet (0.4 mg total) under the tongue every 5 (five) minutes as needed for chest pain. up to 3 doses. 07/05/13   Lendon Colonel, NP     Family History Family History  Problem Relation Age of Onset   Cancer Mother 71    Social History Social History   Tobacco Use   Smoking status: Former Smoker    Quit date: 2004    Years since quitting: 16.6   Smokeless tobacco: Never Used  Substance Use Topics   Alcohol use: Yes    Comment: rare   Drug use: No    Types: Cocaine    Comment: Former cocaine abuse - last use for 51th birthday     Allergies   Lisinopril   Review of Systems Review of Systems  Constitutional: Positive for fatigue. Negative for activity change and appetite change.  HENT: Negative for congestion, ear discharge, ear pain, facial swelling, nosebleeds, rhinorrhea, sneezing and tinnitus.   Eyes: Negative  for photophobia, pain and discharge.  Respiratory: Positive for cough and shortness of breath. Negative for choking and wheezing.   Cardiovascular: Negative for chest pain, palpitations and leg swelling.  Gastrointestinal: Negative for abdominal pain, blood in stool, constipation, diarrhea, nausea and vomiting.  Genitourinary: Negative for difficulty urinating, dysuria, flank pain, frequency and hematuria.  Musculoskeletal: Positive for arthralgias and back pain. Negative for gait problem, myalgias and neck pain.  Skin: Negative for color change, rash and wound.  Neurological: Negative for dizziness, seizures, syncope, facial asymmetry, speech difficulty, weakness and numbness.  Hematological: Negative for adenopathy. Does not bruise/bleed easily.  Psychiatric/Behavioral: Negative for agitation, confusion, hallucinations, self-injury and suicidal ideas. The patient is not nervous/anxious.      Physical Exam Updated Vital Signs BP (!) 139/92 (BP Location: Right Arm)    Pulse 94    Temp 98.1 F (36.7 C) (Oral)    Resp 17    SpO2 95% Comment: RA  Physical Exam Vitals signs and nursing note reviewed.  Constitutional:      Appearance: He is well-developed. He is not toxic-appearing.   HENT:     Head: Normocephalic.     Right Ear: Tympanic membrane and external ear normal.     Left Ear: Tympanic membrane and external ear normal.  Eyes:     General: Lids are normal.     Pupils: Pupils are equal, round, and reactive to light.  Neck:     Musculoskeletal: Normal range of motion and neck supple.     Vascular: No carotid bruit.  Cardiovascular:     Rate and Rhythm: Normal rate and regular rhythm. Occasional extrasystoles are present.    Pulses: Normal pulses.     Heart sounds: Normal heart sounds.  Pulmonary:     Effort: No respiratory distress.     Breath sounds: Normal breath sounds.  Abdominal:     General: Bowel sounds are normal.     Palpations: Abdomen is soft.     Tenderness: There is no abdominal tenderness. There is no guarding.  Musculoskeletal: Normal range of motion.     Right lower leg: No edema.     Left lower leg: No edema.  Lymphadenopathy:     Head:     Right side of head: No submandibular adenopathy.     Left side of head: No submandibular adenopathy.     Cervical: No cervical adenopathy.  Skin:    General: Skin is warm and dry.  Neurological:     Mental Status: He is alert and oriented to person, place, and time.     Cranial Nerves: No cranial nerve deficit.     Sensory: No sensory deficit.  Psychiatric:        Speech: Speech normal.      ED Treatments / Results  Labs (all labs ordered are listed, but only abnormal results are displayed) Labs Reviewed - No data to display  EKG None  Radiology No results found.  Procedures Procedures (including critical care time)  Medications Ordered in ED Medications - No data to display   Initial Impression / Assessment and Plan / ED Course  I have reviewed the triage vital signs and the nursing notes.  Pertinent labs & imaging results that were available during my care of the patient were reviewed by me and considered in my medical decision making (see chart for details).           Final Clinical Impressions(s) / ED Diagnoses MDM  Patient presents to the  emergency department with a complaint of shortness of breath over the last few days accompanied by some right side pain.  Symptoms seem to be getting progressively worse.  No hemoptysis, no chills, no known exposure to the COVID-19 virus.  There is easy fatigue and exertional dyspnea appreciated by the patient.  Pulse oximetry is 92 to 95% on room air.  Vital signs are otherwise stable. Chest x-ray suggests cardiomegaly with cardia vascular congestion and diffuse bilateral interstitial airspace opacities consistent with congestive heart failure and pulmonary edema.  There are more confluent opacities in the lung base that may represent superimposed pneumonia.  Lactic acid and blood cultures obtained.  Patient started on IV Rocephin.  Urine analysis is within normal limits.  Complete blood count is within normal limits. Magnesium normal at 2.2  The lactic acid is normal at 1.1.  The comprehensive metabolic panel shows the creatinine to be elevated at 1.32, this is the patient's normal range.  The alkaline phosphatase is elevated at 47.  The AST is elevated at 44, and the ALT is elevated at 54.  The glomerular filtration rate is within normal limits at 60. The troponin is normal at 7.  The B natruretic peptide is elevated at 150.  The examination and the x-rays and the lab review suggest acute on chronic congestive heart failure and possible community-acquired pneumonia.  We will discuss case with the hospitalist for admission.    Final diagnoses:  Acute on chronic systolic CHF (congestive heart failure) ([place])  Community acquired pneumonia, unspecified laterality    ED Discharge Orders    None       Lily Kocher, PA-C 11/10/18 [place], [place], [place] 11/13/18 [phone]

## 2018-11-10 NOTE — ED Notes (Signed)
Called [facility] for transport to [place]. 

## 2018-11-10 NOTE — H&P (Addendum)
History and Physical  BERT PTACEK GUY:[id number] DOB: 1945-07-06 DOA: 11/10/2018  Referring physician: Meryl Crutch PCP: Rosita Fire, MD   Chief Complaint: SOB   HPI: [name] is a 73 y.o. male with severe CAD s/p CABG and subsequent stent placement, was recently treated for NSTEMI several months ago March 2020.  He has known ischemic cardiomyopathy with EF 35 to 40%.  He is chronically anticoagulated with apixaban due to history of cerebral thrombosis.  He is a former substance abuser of cocaine.  He has hypertension.  He is also on Plavix for antiplatelet treatment.  He presented complaining of 3 days of progressive shortness of breath.  He has had a difficult time ambulating at home.  He reports being short of breath after ambulating a short distance.  He normally walks every other day.  He denies having chest pain but has had some discomfort on the right side of his chest wall.  The patient describes it as a pressure.  The patient denies fever and chills.  He denies any known COVID-19 positive exposure.  He denies nausea and vomiting.  He denies diarrhea.  He reports that he has sleep apnea but does not use CPAP consistently.  He normally sits up in a recliner.  He is not on home oxygen.  ED course: Chest x-ray positive for pulmonary edema and suspicious for pneumonia.  White blood cell count 6.6.  Hemoglobin 14.4.  Platelet count 251.  BNP 150.  Creatinine 1.32.  Bilirubin 1.7.  Sodium 138, potassium 3.5.  Magnesium 2.2.  Lactic acid 1.1.  Patient was given IV antibiotics ceftriaxone.  The patient was given Lasix IV.  Admission was requested for further treatment and management.  Review of Systems: All systems reviewed and apart from history of presenting illness, are negative.  Past Medical History:  Diagnosis Date  . Apical mural thrombus   . Arteriosclerotic cardiovascular disease (ASCVD) 1998   Inferior MI->PCI of the RCA in 1998; acute anterior MI in 06/2002->  PCI of RCA and LAD  with DES x2 to RCA, residual 80% ostial D1, and 80% mid CX and EF-40%; CABG-2011, LIMA-LAD, SVG to D1, OM1 & OM2; EF of 35-40% in 07/2010  . Chronic anticoagulation   . Chronic systolic CHF (congestive heart failure) ([place])   . CVA (cerebral vascular accident) ([place])   . HTN (hypertension)   . Hyperlipidemia   . Keloid    median sternotomy  . Pulmonary embolism ([place]) 03/2010  . Substance abuse [facility])    formerly cocaine   Past Surgical History:  Procedure Laterality Date  . COLONOSCOPY N/A 01/22/2017   Procedure: COLONOSCOPY;  Surgeon: Danie Binder, MD;  [facility];  Service: Endoscopy;  Laterality: N/A;  200  . CORONARY ARTERY BYPASS GRAFT  03/18/2010   LIMA-LAD, SVG to diagonal, OM1 & OM2  . CORONARY STENT INTERVENTION N/A 08/21/2017   Procedure: CORONARY STENT INTERVENTION;  Surgeon: [name], MD;  Location: [place] CV LAB;  Service: Cardiovascular;  Laterality: N/A;  . LEFT HEART CATH AND CORS/GRAFTS ANGIOGRAPHY N/A 08/21/2017   Procedure: LEFT HEART CATH AND CORS/GRAFTS ANGIOGRAPHY;  Surgeon: [name], MD;  Location: [place] CV LAB;  Service: Cardiovascular;  Laterality: N/A;  . LEFT HEART CATH AND CORS/GRAFTS ANGIOGRAPHY N/A 06/21/2018   Procedure: LEFT HEART CATH AND CORS/GRAFTS ANGIOGRAPHY;  Surgeon: Nelva Bush, MD;  Location: [employer] CV LAB;  Service: Cardiovascular;  Laterality: N/A;  . POLYPECTOMY  01/22/2017   Procedure: POLYPECTOMY;  Surgeon: Danie Binder, MD;  [facility];  Service: Endoscopy;;  Transverse(CS) and sigmoid colon(HS)  . PTCA  06/1996   LAD & RCA  . TEE WITHOUT CARDIOVERSION N/A 06/10/2012   Procedure: TRANSESOPHAGEAL ECHOCARDIOGRAM (TEE);  Surgeon: Josue Hector, MD;  [facility];  Service: Cardiovascular;  Laterality: N/A;   Social History:  reports that he quit smoking about 16 years ago. He has never used smokeless tobacco. He reports current alcohol use. He reports that he does not use  drugs.  Allergies  Allergen Reactions  . Lisinopril Swelling and Other (See Comments)    Mouth and tongue swells    Family History  Problem Relation Age of Onset  . Cancer Mother 61    Prior to Admission medications   Medication Sig Start Date End Date Taking? Authorizing Provider  apixaban (ELIQUIS) 5 MG TABS tablet Take 1 tablet (5 mg total) by mouth 2 (two) times daily. 06/22/18  Yes Minus Breeding, MD  atorvastatin (LIPITOR) 40 MG tablet Take 1 tablet (40 mg total) by mouth daily at 6 PM. 03/09/17  Yes Rizwan, Saima, MD  BIDIL 20-37.5 MG tablet TAKE 1 TABLET BY MOUTH THREE TIMES A DAY Patient taking differently: Take 1 tablet by mouth 3 (three) times daily.  02/02/18  Yes Herminio Commons, MD  clopidogrel (PLAVIX) 75 MG tablet Take 1 tablet (75 mg total) by mouth daily with breakfast. 06/23/18  Yes Minus Breeding, MD  cycloSPORINE (RESTASIS) 0.05 % ophthalmic emulsion Place 1 drop into both eyes daily as needed (dry eye).    Yes [provider]  furosemide (LASIX) 40 MG tablet Take 0.5 tablets (20 mg total) by mouth daily. 06/22/18  Yes Reino Bellis B, NP  metoprolol succinate (TOPROL-XL) 50 MG 24 hr tablet Take 50 mg by mouth daily. 04/27/17  Yes [provider]  nitroGLYCERIN (NITROSTAT) 0.4 MG SL tablet Place 1 tablet (0.4 mg total) under the tongue every 5 (five) minutes as needed for chest pain. up to 3 doses. 07/05/13  Yes Lendon Colonel, NP   Physical Exam: Vitals:   11/10/18 1015 11/10/18 1030 11/10/18 1100 11/10/18 1130  BP:  134/81 122/76 136/78  Pulse: 81 90 79 82  Resp: 17 12 18 17   Temp:      TempSrc:      SpO2: 94% 93% 96% 95%    General exam: Moderately built and nourished patient, lying comfortably supine on the gurney in no obvious distress.  Head, eyes and ENT: Nontraumatic and normocephalic. Pupils equally reacting to light and accommodation. Oral mucosa moist.  Neck: Supple. No JVD, carotid bruit or thyromegaly.  Lymphatics:  No lymphadenopathy.  Respiratory system: Clear to auscultation. No increased work of breathing.  Cardiovascular system: normal S1 and S2 heard with PVCs. No JVD, murmurs, gallops, clicks or pedal edema.  Gastrointestinal system: Abdomen is nondistended, soft and nontender. Normal bowel sounds heard. No organomegaly or masses appreciated.  Central nervous system: Alert and oriented. No focal neurological deficits.  Extremities: Symmetric 5 x 5 power. Peripheral pulses symmetrically felt.   Skin: No rashes or acute findings.  Musculoskeletal system: 1+ pretibial edema BLEs.  Psychiatry: Pleasant and cooperative.  Labs on Admission:  Basic Metabolic Panel: Recent Labs  Lab 11/10/18 1005 11/10/18 1045  NA 138  --   K 3.5  --   CL 105  --   CO2 24  --   GLUCOSE 110*  --   BUN 17  --  CREATININE 1.32*  --   CALCIUM 8.5*  --   MG  --  2.2   Liver Function Tests: Recent Labs  Lab 11/10/18 1005  AST 44*  ALT 54*  ALKPHOS 47  BILITOT 1.7*  PROT 7.3  ALBUMIN 3.3*   No results for input(s): LIPASE, AMYLASE in the last 168 hours. No results for input(s): AMMONIA in the last 168 hours. CBC: Recent Labs  Lab 11/10/18 1005  WBC 6.6  NEUTROABS 3.8  HGB 14.4  HCT 45.5  MCV 88.7  PLT 251   Cardiac Enzymes: No results for input(s): CKTOTAL, CKMB, CKMBINDEX, TROPONINI in the last 168 hours.  BNP (last 3 results) No results for input(s): PROBNP in the last 8760 hours. CBG: No results for input(s): GLUCAP in the last 168 hours.  Radiological Exams on Admission: Dg Chest Portable 1 View  Result Date: 11/10/2018 CLINICAL DATA:  Shortness of breath EXAM: PORTABLE CHEST 1 VIEW COMPARISON:  06/20/2018 FINDINGS: Stable cardiomediastinal contours. Post CABG changes. There is pulmonary vascular congestion. Perihilar interstitial opacities with more confluent, patchy opacities in the bilateral lung bases. No pneumothorax. IMPRESSION: Cardiomegaly with pulmonary vascular  congestion and diffuse bilateral interstitial airspace opacities suggesting CHF/pulmonary edema. More confluent opacities in the lung bases may represent superimposed pneumonia. Electronically Signed   By: Davina Poke M.D.   On: 11/10/2018 10:28   EKG: Personally reviewed.  No acute ST-T wave abnormalities.  Assessment/Plan Principal Problem:   Acute on chronic systolic (congestive) heart failure (HCC) Active Problems:   Hyperlipidemia   Essential hypertension   Arteriosclerotic cardiovascular disease (ASCVD)   CKD (chronic kidney disease), stage III (HCC)   OSA (obstructive sleep apnea)   Chronic systolic heart failure ([place])  1. Acute on chronic systolic congestive heart failure-patient is volume overloaded with pulmonary edema.  Continue IV Lasix.  Continue monitoring daily weights and intake and output.  Continue monitoring electrolytes closely.  Admitted to telemetry.  Continue diuresis and follow clinical progress.  Did not order repeat 2D echocardiogram as he did have 1 in March 2020.  His EF was 35-40% at that time. 2. Community-acquired pneumonia- COVID-19 test is pending.  Continue ceftriaxone and azithromycin was added.  Follow blood cultures.  Continue to support with oxygen as needed.  Follow clinically.  Update: Covid test Positive: Covid orderset started, solumedrol 125 mg IV ordered.  Change admission to [facility].  I signed out care to accepting MD Dr. Candiss Norse at the [facility] campus caring for Covid patients.   3. Coronary artery disease- resume metoprolol in a.m.  Resume other cardiac medications.  Troponin pending.  Monitor telemetry. 4. OSA-will offer nightly CPAP while in hospital. 5. Essential hypertension-plan on resuming home blood pressure medication regimen.  Follow blood pressures. 6. CKD stage III-monitor creatinine closely in the setting of IV diuresis. 7. Hyperlipidemia-resume home statin therapy. 8. History of cerebral thrombosis-he is chronically on apixaban which will be  continued. 9. History of recreational substance abuse - UDS pending.   DVT Prophylaxis: Apixaban Code Status: Full Family Communication: Patient updated at bedside Disposition Plan: Inpatient for IV diuresis  Time spent: 59 minutes  Leonidus Rowand Wynetta Emery, MD [facility] How to contact the [facility] Attending or Consulting provider [place] or covering provider during after hours [place], for this patient?  1. Check the care team in [facility] and look for a) attending/consulting [facility] provider listed and b) the [facility] team listed 2. Log into [email or web address] and use [place]'s universal password to access. If you  do not have the password, please contact the hospital operator. 3. Locate the [facility] provider you are looking for under [facility] and page to a number that you can be directly reached. 4. If you still have difficulty reaching the provider, please page the [facility] (Director on Call) for the Hospitalists listed on amion for assistance.

## 2018-11-10 NOTE — ED Notes (Signed)
[place] (daughter).

## 2018-11-10 NOTE — Progress Notes (Signed)
Paged [facility] pager a 2nd time with no response. Notified CN and verified in note that Dr Wynetta Emery spoke with Dr Candiss Norse regarding transfer. Dr Candiss Norse called back and is aware pt has arrived.

## 2018-11-10 NOTE — ED Triage Notes (Signed)
C/o SOB for last couple days.  C/o pain to right side.

## 2018-11-10 NOTE — Progress Notes (Addendum)
Pt arrived via ems from [facility]. Pt alert and oriented x4, up in the room, able to walk to bathroom with steady gait. Pt states he had a BM this am, that was loose. Pt states he only gets SOB with exertion. Pt denies pain, reports he is hungry.   Paged [facility] admitting pager. Provided pt with dinner tray. Pt currently on phone with his daughter. Pt denies further needs.

## 2018-11-11 ENCOUNTER — Inpatient Hospital Stay (HOSPITAL_COMMUNITY): Payer: Medicare Other

## 2018-11-11 LAB — COMPREHENSIVE METABOLIC PANEL
ALT: 54 U/L — ABNORMAL HIGH (ref 0–44)
AST: 41 U/L (ref 15–41)
Albumin: 3.5 g/dL (ref 3.5–5.0)
Alkaline Phosphatase: 46 U/L (ref 38–126)
Anion gap: 10 (ref 5–15)
BUN: 22 mg/dL (ref 8–23)
CO2: 27 mmol/L (ref 22–32)
Calcium: 8.9 mg/dL (ref 8.9–10.3)
Chloride: 101 mmol/L (ref 98–111)
Creatinine, Ser: 1.53 mg/dL — ABNORMAL HIGH (ref 0.61–1.24)
GFR calc Af Amer: 52 mL/min — ABNORMAL LOW (ref >60)
GFR calc non Af Amer: 45 mL/min — ABNORMAL LOW (ref >60)
Glucose, Bld: 113 mg/dL — ABNORMAL HIGH (ref 70–99)
Potassium: 4.8 mmol/L (ref 3.5–5.1)
Sodium: 138 mmol/L (ref 135–145)
Total Bilirubin: 1.4 mg/dL — ABNORMAL HIGH (ref 0.3–1.2)
Total Protein: 8.1 g/dL (ref 6.5–8.1)

## 2018-11-11 LAB — CBC WITH DIFFERENTIAL/PLATELET
Abs Immature Granulocytes: 0.03 10*3/uL (ref 0.00–0.07)
Basophils Absolute: 0.1 10*3/uL (ref 0.0–0.1)
Basophils Relative: 1 %
Eosinophils Absolute: 0 10*3/uL (ref 0.0–0.5)
Eosinophils Relative: 0 %
HCT: 48.1 % (ref 39.0–52.0)
Hemoglobin: 15.3 g/dL (ref 13.0–17.0)
Immature Granulocytes: 0 %
Lymphocytes Relative: 20 %
Lymphs Abs: 1.8 10*3/uL (ref 0.7–4.0)
MCH: 28.9 pg (ref 26.0–34.0)
MCHC: 31.8 g/dL (ref 30.0–36.0)
MCV: 90.8 fL (ref 80.0–100.0)
Monocytes Absolute: 0.3 10*3/uL (ref 0.1–1.0)
Monocytes Relative: 3 %
Neutro Abs: 7 10*3/uL (ref 1.7–7.7)
Neutrophils Relative %: 76 %
Platelets: 243 10*3/uL (ref 150–400)
RBC: 5.3 MIL/uL (ref 4.22–5.81)
RDW: 13 % (ref 11.5–15.5)
WBC: 9.2 10*3/uL (ref 4.0–10.5)
nRBC: 0 % (ref 0.0–0.2)

## 2018-11-11 LAB — LACTATE DEHYDROGENASE: LDH: 334 U/L — ABNORMAL HIGH (ref 98–192)

## 2018-11-11 LAB — MAGNESIUM: Magnesium: 2.6 mg/dL — ABNORMAL HIGH (ref 1.7–2.4)

## 2018-11-11 LAB — FERRITIN: Ferritin: 412 ng/mL — ABNORMAL HIGH (ref 24–336)

## 2018-11-11 LAB — C-REACTIVE PROTEIN: CRP: 3.7 mg/dL — ABNORMAL HIGH (ref <1.0)

## 2018-11-11 LAB — ABO/RH: ABO/RH(D): B POS

## 2018-11-11 LAB — D-DIMER, QUANTITATIVE: D-Dimer, Quant: 0.67 ug/mL-FEU — ABNORMAL HIGH (ref 0.00–0.50)

## 2018-11-11 LAB — BRAIN NATRIURETIC PEPTIDE: B Natriuretic Peptide: 123.8 pg/mL — ABNORMAL HIGH (ref 0.0–100.0)

## 2018-11-11 MED ORDER — FUROSEMIDE 20 MG PO TABS
40.0000 mg | ORAL_TABLET | Freq: Once | ORAL | Status: AC
  Administered 2018-11-11: 40 mg via ORAL
  Filled 2018-11-11: qty 2

## 2018-11-11 NOTE — Progress Notes (Signed)
PROGRESS NOTE                                                                                                                                                                                                             Patient Demographics:    [name], is a 73 y.o. male, DOB - 19-Sep-1945, WYO:[id number]  Admit date - 11/10/2018   Admitting Physician Murlean Iba, MD  Outpatient Primary MD for the patient is Rosita Fire, MD  LOS - 1  Chief Complaint  Patient presents with  . Shortness of Breath       Brief Narrative  [name] is a 73 y.o. male with severe CAD s/p CABG and subsequent stent placement, was recently treated for NSTEMI several months ago March 2020.  He has known ischemic cardiomyopathy with EF 35 to 40%.  He is chronically anticoagulated with apixaban due to history of cerebral thrombosis.  He is a former substance abuser of cocaine.  He has hypertension.  He is also on Plavix for antiplatelet treatment.  He presented complaining of 3 days of progressive shortness of breath, he was found to have both COVID-19 infection along with CHF exacerbation and admitted to the hospital.   Subjective:    [name] today has, No headache, No chest pain, No abdominal pain - No Nausea, No new weakness tingling or numbness, No Cough - SOB.     Assessment  & Plan :      1.  Acute hypoxic respiratory failure requiring nasal cannula oxygen due to acute on chronic mild systolic and diastolic CHF EF 77% on echocardiogram done in March 2020.  Responded very well to IV Lasix, now down to nasal cannula oxygen continue to monitor, continue beta-blocker, renal function does not allow addition of ACE or ARB, continue combination of Imdur and hydralazine.  Already better will advance activity and monitor closely.  Low-dose oral Lasix today and monitor.  2.  Incidental COVID-19 infection.  Stable from that standpoint inflammatory markers stable.  On low-dose steroids  continue to monitor.  COVID-19 Labs  Recent Labs    11/10/18 1005 11/10/18 1048 11/11/18 0230  DDIMER 0.74*  --  0.67*  FERRITIN  --   --  412*  LDH  --   --  334*  CRP  --  3.9* 3.7*    Lab Results  Component Value Date   SARSCOV2NAA POSITIVE (A) 11/10/2018    3.  CAD.  Stable no chest pain.  Continue beta-blocker for secondary prevention.  4.  OSA.  On CPAP at night.  Nasal cannula oxygen at night here.  5.  CKD 3.  Baseline creatinine close to 1.5 monitor.  6.  Dyslipidemia.  Stable on home dose statin.  7.  History of cerebral thrombosis.  Stable on Eliquis.  8.  History of recreational drug abuse.  Stable UDS.   Family Communication  :  none  Code Status :  Full  Disposition Plan  :  Home in 1-2 days  Consults  :  None  Procedures  :    TTE 06/2018   1. The left ventricle has moderately reduced systolic function, with an ejection fraction of 35-40%. The cavity size was normal. There is mildly increased left ventricular wall thickness. Left ventricular diastolic Doppler parameters are consistent with  impaired relaxation. Indeterminate filling pressures The E/e' is 8-15.  2. Severe akinesis of the left ventricular, mid-apical inferior wall, anteroseptal wall, inferoseptal wall and anteroapical wall.  3. The right ventricle has normal systolic function. The cavity was normal. There is no increase in right ventricular wall thickness.  4. The aortic valve was not well visualized.  5. The aortic root and ascending aorta are normal in size and structure.  6. The interatrial septum was not well visualized.    DVT Prophylaxis  : Eliquis  Lab Results  Component Value Date   PLT 243 11/11/2018    Diet :  Diet Order            Diet heart healthy/carb modified Room service appropriate? Yes; Fluid consistency: Thin  Diet effective now               Inpatient Medications Scheduled Meds: . apixaban  5 mg Oral BID  . atorvastatin  40 mg Oral q1800  .  clopidogrel  75 mg Oral Q breakfast  . feeding supplement (ENSURE ENLIVE)  237 mL Oral BID BM  . isosorbide-hydrALAZINE  1 tablet Oral BID  . methylPREDNISolone (SOLU-MEDROL) injection  60 mg Intravenous Q12H  . metoprolol succinate  50 mg Oral Daily  . sodium chloride flush  3 mL Intravenous Q12H   Continuous Infusions: . sodium chloride     PRN Meds:.sodium chloride, acetaminophen, albuterol, nitroGLYCERIN, ondansetron (ZOFRAN) IV, sodium chloride flush, zolpidem  Antibiotics  :   Anti-infectives (From admission, onward)   Start     Dose/Rate Route Frequency Ordered Stop   11/11/18 1000  azithromycin (ZITHROMAX) tablet 250 mg  Status:  Discontinued     250 mg Oral Daily 11/10/18 1215 11/10/18 1722   11/10/18 1230  azithromycin (ZITHROMAX) tablet 500 mg     500 mg Oral Daily 11/10/18 1215 11/10/18 1315   11/10/18 1100  cefTRIAXone (ROCEPHIN) 1 g in sodium chloride 0.9 % 100 mL IVPB     1 g 200 mL/hr over 30 Minutes Intravenous  Once 11/10/18 1048 11/10/18 1250          Objective:   Vitals:   11/11/18 0000 11/11/18 0400 11/11/18 0751 11/11/18 0906  BP: 118/78   113/70  Pulse: 90 72  98  Resp: 16 13  (!) 23  Temp: 97.6 F (36.4 C) 98.3 F (36.8 C) 98.5 F (36.9 C)   TempSrc: Oral Oral Oral   SpO2: 91% 94%  92%  Weight:      Height:        Wt Readings from Last 3 Encounters:  11/10/18 96.4 kg  07/30/18 101.6 kg  06/22/18 100.7  kg     Intake/Output Summary (Last 24 hours) at 11/11/2018 0951 Last data filed at 11/11/2018 0900 Gross per 24 hour  Intake 700 ml  Output 800 ml  Net -100 ml     Physical Exam  Awake Alert, Oriented X 3, No new F.N deficits, Normal affect [name].AT,PERRAL Supple Neck,No JVD, No cervical lymphadenopathy appriciated.  Symmetrical Chest wall movement, Good air movement bilaterally, CTAB RRR,No Gallops,Rubs or new Murmurs, No Parasternal Heave +ve B.Sounds, Abd Soft, No tenderness, No organomegaly appriciated, No rebound - guarding or  rigidity. No Cyanosis, Clubbing or edema, No new Rash or bruise     Data Review:    CBC Recent Labs  Lab 11/10/18 1005 11/11/18 0230  WBC 6.6 9.2  HGB 14.4 15.3  HCT 45.5 48.1  PLT 251 243  MCV 88.7 90.8  MCH 28.1 28.9  MCHC 31.6 31.8  RDW 12.8 13.0  LYMPHSABS 2.0 1.8  MONOABS 0.6 0.3  EOSABS 0.1 0.0  BASOSABS 0.1 0.1    Chemistries  Recent Labs  Lab 11/10/18 1005 11/10/18 1045 11/11/18 0230  NA 138  --  138  K 3.5  --  4.8  CL 105  --  101  CO2 24  --  27  GLUCOSE 110*  --  113*  BUN 17  --  22  CREATININE 1.32*  --  1.53*  CALCIUM 8.5*  --  8.9  MG  --  2.2 2.6*  AST 44*  --  41  ALT 54*  --  54*  ALKPHOS 47  --  46  BILITOT 1.7*  --  1.4*   ------------------------------------------------------------------------------------------------------------------ No results for input(s): CHOL, HDL, LDLCALC, TRIG, CHOLHDL, LDLDIRECT in the last 72 hours.  Lab Results  Component Value Date   HGBA1C 5.6 03/09/2017   ------------------------------------------------------------------------------------------------------------------ No results for input(s): TSH, T4TOTAL, T3FREE, THYROIDAB in the last 72 hours.  Invalid input(s): FREET3 ------------------------------------------------------------------------------------------------------------------ Recent Labs    11/11/18 0230  FERRITIN 412*    Coagulation profile No results for input(s): INR, PROTIME in the last 168 hours.  Recent Labs    11/10/18 1005 11/11/18 0230  DDIMER 0.74* 0.67*    Cardiac Enzymes No results for input(s): CKMB, TROPONINI, MYOGLOBIN in the last 168 hours.  Invalid input(s): CK ------------------------------------------------------------------------------------------------------------------    Component Value Date/Time   BNP 123.8 (H) 11/11/2018 0230   BNP 39.4 02/13/2012 0854    Micro Results Recent Results (from the past 240 hour(s))  Blood culture (routine x 2)      Status: None (Preliminary result)   Collection Time: 11/10/18 11:11 AM   Specimen: BLOOD RIGHT HAND  Result Value Ref Range Status   Specimen Description BLOOD RIGHT HAND  Final   Special Requests   Final    BOTTLES DRAWN AEROBIC AND ANAEROBIC Blood Culture adequate volume   Culture   Final    NO GROWTH < 24 HOURS Performed at [facility], [address] [place], [place] [address]    Report Status PENDING  Incomplete  Blood culture (routine x 2)     Status: None (Preliminary result)   Collection Time: 11/10/18 11:12 AM   Specimen: BLOOD LEFT HAND  Result Value Ref Range Status   Specimen Description BLOOD LEFT HAND  Final   Special Requests   Final    BOTTLES DRAWN AEROBIC AND ANAEROBIC Blood Culture adequate volume   Culture   Final    NO GROWTH < 24 HOURS Performed at [facility], [address] [place],  [place] [address]    Report Status PENDING  Incomplete  SARS Coronavirus 2 [facility] order, Performed in [facility] hospital lab) Nasopharyngeal Nasopharyngeal Swab     Status: Abnormal   Collection Time: 11/10/18 12:26 PM   Specimen: Nasopharyngeal Swab  Result Value Ref Range Status   SARS Coronavirus 2 POSITIVE (A) NEGATIVE Final    Comment: RESULT CALLED TO, READ BACK BY AND VERIFIED WITH: MARTIN D. AT 1328 ON [id number] BY THOMPSON S. Performed at [facility], [address] [place], [place] [address]     Radiology Reports Dg Chest [facility] 1 View  Result Date: 11/11/2018 CLINICAL DATA:  COVID-19 positivity.  Shortness of breath EXAM: PORTABLE CHEST 1 VIEW COMPARISON:  Yesterday FINDINGS: Interstitial coarsening at the lung bases is stable. No effusion or pneumothorax. Stable heart size. Prior CABG. IMPRESSION: Stable interstitial opacity which could be congestive or from atypical infection. Electronically Signed   By: Monte Fantasia M.D.   On: 11/11/2018 08:55   Dg Chest Portable 1 View  Result Date: 11/10/2018 CLINICAL DATA:  Shortness of breath EXAM: PORTABLE CHEST 1  VIEW COMPARISON:  06/20/2018 FINDINGS: Stable cardiomediastinal contours. Post CABG changes. There is pulmonary vascular congestion. Perihilar interstitial opacities with more confluent, patchy opacities in the bilateral lung bases. No pneumothorax. IMPRESSION: Cardiomegaly with pulmonary vascular congestion and diffuse bilateral interstitial airspace opacities suggesting CHF/pulmonary edema. More confluent opacities in the lung bases may represent superimposed pneumonia. Electronically Signed   By: Davina Poke M.D.   On: 11/10/2018 10:28    Time Spent in minutes  30   Lala Lund M.D on 11/11/2018 at 9:51 AM  To page go to [email or web address] - password [facility]

## 2018-11-11 NOTE — Progress Notes (Signed)
Initial Nutrition Assessment  DOCUMENTATION CODES:   Morbid obesity  INTERVENTION:   Pt receiving Hormel Shake daily with Breakfast which provides 520 kcals and 22 g of protein Magic cup BID with lunch and dinner, each supplement provides 290 kcal and 9 grams of protein, automatically on meal trays to optimize nutritional intake.   Encourage PO intake  NUTRITION DIAGNOSIS:   Increased nutrient needs related to acute illness(COVID) as evidenced by estimated needs.  GOAL:   Patient will meet greater than or equal to 90% of their needs  MONITOR:   PO intake, Supplement acceptance  REASON FOR ASSESSMENT:   Malnutrition Screening Tool    ASSESSMENT:   Pt with PMH of cocaine abuse, COPD, ischemic cardiomyopathy with EF 35-40%, HTN, severe CAD s/p CABG, NSTEMI 06/2018 who is now admitted with COVID-19 and CHF exacerbation.    Noted per chart review pt with weight loss, 5.2 kg, in 4 months. Unsure if this is related to fluid shifts.  Meal Completion: 100%  Medications reviewed and include: solumedrol  Labs reviewed    NUTRITION - FOCUSED PHYSICAL EXAM:  Deferred   Diet Order:   Diet Order            Diet heart healthy/carb modified Room service appropriate? Yes; Fluid consistency: Thin  Diet effective now              EDUCATION NEEDS:   No education needs have been identified at this time  Skin:  Skin Assessment: Reviewed RN Assessment  Last BM:  8/5  Height:   Ht Readings from Last 1 Encounters:  11/10/18 5\' 6"  (1.676 m)    Weight:   Wt Readings from Last 1 Encounters:  11/10/18 96.4 kg    Ideal Body Weight:  64.5 kg  BMI:  Body mass index is 34.31 kg/m.  Estimated Nutritional Needs:   Kcal:  2000-2200  Protein:  100-120 grams  Fluid:  2 L/day  Maylon Peppers RD, LDN, CNSC [phone] Pager [phone] After Hours Pager

## 2018-11-11 NOTE — Care Management (Signed)
Case manager acknowledged consult to arrange setup with Heart Failure program.

## 2018-11-11 NOTE — Evaluation (Signed)
Physical Therapy Evaluation & Discharge Patient Details Name: [name] MRN: [id number] DOB: May 25, 1945 Today's Date: 11/11/2018   History of Present Illness  Pt is a 73 y.o. male admitted 11/10/18  with progressive SOB and chest pressure; CXR with pulmonary edema. Tested positive COVID-19. Of note, recent admission 06/2018 for NSTEMI. PMH includes CAD, HTN, cerebral thrombosis, OSA, CKD III.    Clinical Impression  Patient evaluated by Physical Therapy with no further acute PT needs identified. PTA, pt indep and lives with children; pt drives, enjoys walking and playing with grandkids. Today, pt indep with ambulation and ADLs. SpO2 >/88% on RA with mobility. Educ re: activity recommendations, energy conservation strategies, quarantine recs upon return home. All education has been completed and the patient has no further questions. Acute PT is signing off. Thank you for this referral.  SpO2 88-94% on RA with ambulation HR 97 BP 113/70    Follow Up Recommendations No PT follow up    Equipment Recommendations  None recommended by PT    Recommendations for Other Services       Precautions / Restrictions Precautions Precautions: None Restrictions Weight Bearing Restrictions: No      Mobility  Bed Mobility               General bed mobility comments: Received sitting in recliner. Pt reports indep to get in bed and into prone position  Transfers Overall transfer level: Independent Equipment used: None                Ambulation/Gait Ambulation/Gait assistance: Independent Gait Distance (Feet): 250 Feet Assistive device: None Gait Pattern/deviations: Step-through pattern;Decreased stride length Gait velocity: Decreased Gait velocity interpretation: 1.31 - 2.62 ft/sec, indicative of limited community ambulator General Gait Details: Slow, steady gait; indep. SpO2 down to 88% briefly on RA, maintaining mostly 90-94% on RA  Stairs            Wheelchair  Mobility    Modified Rankin (Stroke Patients Only)       Balance Overall balance assessment: Mild deficits observed, not formally tested                                           Pertinent Vitals/Pain Pain Assessment: No/denies pain    Home Living Family/patient expects to be discharged to:: Private residence Living Arrangements: Children Available Help at Discharge: Family;Available 24 hours/day Type of Home: Mobile home Home Access: Stairs to enter     Home Layout: One level Home Equipment: None Additional Comments: Daughter is CNA at SNF; multiple supportive family nearby    Prior Function Level of Independence: Independent         Comments: Drives. Enjoys walking at park and playing with grandkids. Blind R-eye since stroke (?)     Hand Dominance   Dominant Hand: Right    Extremity/Trunk Assessment   Upper Extremity Assessment Upper Extremity Assessment: Overall WFL for tasks assessed    Lower Extremity Assessment Lower Extremity Assessment: Overall WFL for tasks assessed       Communication   Communication: HOH  Cognition Arousal/Alertness: Awake/alert Behavior During Therapy: WFL for tasks assessed/performed Overall Cognitive Status: Within Functional Limits for tasks assessed                                 General Comments: [facility]  for simple tasks. H/o stroke which pt reports he still can have trouble getting thoughts out since this; exacerbated by [facility]      General Comments      Exercises     Assessment/Plan    PT Assessment Patent does not need any further PT services  PT Problem List         PT Treatment Interventions      PT Goals (Current goals can be found in the Care Plan section)  Acute Rehab PT Goals PT Goal Formulation: All assessment and education complete, DC therapy    Frequency     Barriers to discharge        Co-evaluation               AM-PAC PT "6 Clicks" Mobility   Outcome Measure Help needed turning from your back to your side while in a flat bed without using bedrails?: None Help needed moving from lying on your back to sitting on the side of a flat bed without using bedrails?: None Help needed moving to and from a bed to a chair (including a wheelchair)?: None Help needed standing up from a chair using your arms (e.g., wheelchair or bedside chair)?: None Help needed to walk in hospital room?: None Help needed climbing 3-5 steps with a railing? : A Little 6 Click Score: 23    End of Session   Activity Tolerance: Patient tolerated treatment well Patient left: in chair;with call bell/phone within reach Nurse Communication: Mobility status PT Visit Diagnosis: Other abnormalities of gait and mobility (R26.89)    Time: 0938-1000 PT Time Calculation (min) (ACUTE ONLY): 22 min   Charges:   PT Evaluation $PT Eval Moderate Complexity: [place], PT, DPT Acute Rehabilitation Services  Pager [phone] Office [place] 11/11/2018, 10:31 AM

## 2018-11-11 NOTE — Progress Notes (Signed)
The pt's Daughter dropped off socks, underwear, and a phone charger for the pt. Rod Holler, CN aware and gave permission.

## 2018-11-11 NOTE — Progress Notes (Signed)
The pt's Daughter was called and provided with a daily update regarding the pt's current status. Any questions or concerns were addressed.

## 2018-11-11 NOTE — TOC Progression Note (Signed)
Transition of Care [facility]) - Progression Note    Patient Details  Name: [name] MRN: [id number] Date of Birth: Jun 23, 1945  [facility] CM/SW Contact  Loletha Grayer Beverely Pace, RN Phone Number: [phone] (working Remotely) 11/11/2018, 3:58 PM  Clinical Narrative:   Pt is a 73 y.o. male admitted 11/10/18  with progressive SOB and chest pressure; CXR with pulmonary edema. Tested positive COVID-19.  Thankfully patient is beginning to improve and preparing for discharge within next few days. Case manager spoke with patient's daughter, [name], [phone] concerning discharge plan, explained patient's need for Heart Failure program participation. Referral was called to [name], Liaison with [facility]. [name] expresses understanding, asked that I call her sister, [name], [phone] to confirm address.CM spoke with [name] who states patient resides with her at [place], [place], [place]. She also understands discharge plan.    Expected Discharge Plan: [place] Barriers to Discharge: No Barriers Identified  Expected Discharge Plan and Services Expected Discharge Plan: [place]   Discharge Planning Services: CM Consult Post Acute Care Choice: [place] arrangements for the past 2 months: Mobile Home Expected Discharge Date: 11/13/18               DME Arranged: N/A         HH Arranged: Disease Management(Heart failure program) HH Agency: [place] Date [facility] Agency Contacted: 11/11/18 Time [place]: [id number] Representative spoke with at [place]: [name] ([place]) Interventions    Readmission Risk Interventions No flowsheet data found.

## 2018-11-12 ENCOUNTER — Inpatient Hospital Stay (HOSPITAL_COMMUNITY): Payer: Medicare Other

## 2018-11-12 LAB — COMPREHENSIVE METABOLIC PANEL
ALT: 58 U/L — ABNORMAL HIGH (ref 0–44)
AST: 45 U/L — ABNORMAL HIGH (ref 15–41)
Albumin: 3.6 g/dL (ref 3.5–5.0)
Alkaline Phosphatase: 50 U/L (ref 38–126)
Anion gap: 9 (ref 5–15)
BUN: 31 mg/dL — ABNORMAL HIGH (ref 8–23)
CO2: 26 mmol/L (ref 22–32)
Calcium: 8.9 mg/dL (ref 8.9–10.3)
Chloride: 104 mmol/L (ref 98–111)
Creatinine, Ser: 1.54 mg/dL — ABNORMAL HIGH (ref 0.61–1.24)
GFR calc Af Amer: 51 mL/min — ABNORMAL LOW (ref >60)
GFR calc non Af Amer: 44 mL/min — ABNORMAL LOW (ref >60)
Glucose, Bld: 119 mg/dL — ABNORMAL HIGH (ref 70–99)
Potassium: 4.9 mmol/L (ref 3.5–5.1)
Sodium: 139 mmol/L (ref 135–145)
Total Bilirubin: 1.3 mg/dL — ABNORMAL HIGH (ref 0.3–1.2)
Total Protein: 8.2 g/dL — ABNORMAL HIGH (ref 6.5–8.1)

## 2018-11-12 LAB — PROCALCITONIN: Procalcitonin: <0.1 ng/mL

## 2018-11-12 LAB — CBC WITH DIFFERENTIAL/PLATELET
Abs Immature Granulocytes: 0.15 10*3/uL — ABNORMAL HIGH (ref 0.00–0.07)
Basophils Absolute: 0.1 10*3/uL (ref 0.0–0.1)
Basophils Relative: 0 %
Eosinophils Absolute: 0 10*3/uL (ref 0.0–0.5)
Eosinophils Relative: 0 %
HCT: 47.3 % (ref 39.0–52.0)
Hemoglobin: 15.1 g/dL (ref 13.0–17.0)
Immature Granulocytes: 1 %
Lymphocytes Relative: 9 %
Lymphs Abs: 1.8 10*3/uL (ref 0.7–4.0)
MCH: 28.9 pg (ref 26.0–34.0)
MCHC: 31.9 g/dL (ref 30.0–36.0)
MCV: 90.4 fL (ref 80.0–100.0)
Monocytes Absolute: 0.5 10*3/uL (ref 0.1–1.0)
Monocytes Relative: 2 %
Neutro Abs: 18.7 10*3/uL — ABNORMAL HIGH (ref 1.7–7.7)
Neutrophils Relative %: 88 %
Platelets: 284 10*3/uL (ref 150–400)
RBC: 5.23 MIL/uL (ref 4.22–5.81)
RDW: 13.2 % (ref 11.5–15.5)
WBC: 21.1 10*3/uL — ABNORMAL HIGH (ref 4.0–10.5)
nRBC: 0 % (ref 0.0–0.2)

## 2018-11-12 LAB — MAGNESIUM: Magnesium: 2.4 mg/dL (ref 1.7–2.4)

## 2018-11-12 LAB — LACTATE DEHYDROGENASE: LDH: 286 U/L — ABNORMAL HIGH (ref 98–192)

## 2018-11-12 LAB — D-DIMER, QUANTITATIVE: D-Dimer, Quant: 0.6 ug/mL-FEU — ABNORMAL HIGH (ref 0.00–0.50)

## 2018-11-12 LAB — FERRITIN: Ferritin: 457 ng/mL — ABNORMAL HIGH (ref 24–336)

## 2018-11-12 LAB — C-REACTIVE PROTEIN: CRP: 2 mg/dL — ABNORMAL HIGH (ref <1.0)

## 2018-11-12 LAB — BRAIN NATRIURETIC PEPTIDE: B Natriuretic Peptide: 108.7 pg/mL — ABNORMAL HIGH (ref 0.0–100.0)

## 2018-11-12 MED ORDER — PREDNISONE 5 MG PO TABS: ORAL_TABLET | ORAL | 0 refills | Status: DC

## 2018-11-12 MED ORDER — FUROSEMIDE 20 MG PO TABS: 60.0000 mg | ORAL_TABLET | Freq: Every day | ORAL | 0 refills | Status: DC

## 2018-11-12 NOTE — Discharge Summary (Signed)
[name] KGU:[id number] DOB: Mar 14, 1946 DOA: 11/10/2018  PCP: Rosita Fire, MD  Admit date: 11/10/2018  Discharge date: 11/12/2018  Admitted From: Home   Disposition:  Home   Recommendations for Outpatient Follow-up:   Follow up with PCP in 1-2 weeks  PCP Please obtain BMP/CBC, 2 view CXR in 1week,  (see Discharge instructions)   PCP Please follow up on the following pending results: Monitor diuretic dose and BMP closely   Home Health: RN, PT Equipment/Devices: None Consultations: None  Discharge Condition: Stable    CODE STATUS: Full    Diet Recommendation: Heart Healthy low carbohydrate diet with strict 1.5 L fluid restriction.   Chief Complaint  Patient presents with   Shortness of Breath     Brief history of present illness from the day of admission and additional interim summary    Brian L Walkeris a 73 y.o.malewith severe CAD s/p CABG and subsequent stent placement, was recently treated for NSTEMI several months ago March 2020. 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 He is chronically anticoagulated with apixaban due to history of cerebral thrombosis. He is a former substance abuser of cocaine. He has hypertension. He is also on Plavix for antiplatelet treatment. He presented complaining of 3 days of progressive shortness of breath, he was found to have both COVID-19 infection along with CHF exacerbation and admitted to the hospital.                                                                 Hospital Course    1.  Acute hypoxic respiratory failure requiring nasal cannula oxygen due to acute on chronic mild systolic and diastolic CHF EF 62% on echocardiogram done in March 2020.  Responded very well to IV Lasix, now stable on room air, continue beta-blocker, renal  function does not allow addition of ACE or ARB, continue combination of Imdur and hydralazine.    He is now completely symptom-free with no symptoms on room air, will be placed on 60 mg of oral Lasix instead of 20 mg daily, placed on fluid and salt restriction with outpatient PCP and home health follow-up.  Also quested him to follow-up with his primary cardiologist in [place] within a week.  Request PCP to monitor his BMP along with his diuretic dose closely in the outpatient setting.    2.  Incidental COVID-19 infection.  Stable from that standpoint inflammatory markers stable.    Will be discharged on low-dose steroid taper over 7 to 10 days with outpatient PCP follow-up.  3.  CAD.  Stable no chest pain.  Continue beta-blocker for secondary prevention.  4.  OSA.  On CPAP at night.  Nasal cannula oxygen at night here.  5.  CKD 3.  Baseline creatinine close to 1.5 monitor.  6.  Dyslipidemia.  Stable on home dose statin.  7.  History of cerebral thrombosis.  Stable on Eliquis.  8.  History of recreational drug abuse.  Stable UDS.  Discharge diagnosis     Principal Problem:   Acute on chronic systolic (congestive) heart failure (HCC) Active Problems:   Hyperlipidemia   Essential hypertension   Arteriosclerotic cardiovascular disease (ASCVD)   CKD (chronic kidney disease), stage III (HCC)   OSA (obstructive sleep apnea)   Chronic systolic heart failure ([place])   Pneumonia due to COVID-19 virus    Discharge instructions    Discharge Instructions    Discharge instructions   Complete by: As directed    Follow with Primary MD Rosita Fire, MD in 7 days   Get CBC, CMP, 2 view Chest X ray -  checked next visit within 1 week by Primary MD and get your Lasix dose adjusted and monitored closely.  Activity: As tolerated with Full fall precautions use [place]/cane & assistance as needed  Disposition Home   Diet: Heart Healthy Low Carb  For Heart failure patients - Check your  Weight same time everyday, if you gain over 2 pounds, or you develop in leg swelling, experience more shortness of breath or chest pain, call your Primary MD immediately. Follow Cardiac Low Salt Diet and 1.5 lit/day fluid restriction.  Special Instructions: If you have smoked or chewed Tobacco  in the last 2 yrs please stop smoking, stop any regular Alcohol  and or any Recreational drug use.  On your next visit with your primary care physician please Get Medicines reviewed and adjusted.  Please request your Prim.MD to go over all Hospital Tests and Procedure/Radiological results at the follow up, please get all Hospital records sent to your Prim MD by signing hospital release before you go home.  If you experience worsening of your admission symptoms, develop shortness of breath, life threatening emergency, suicidal or homicidal thoughts you must seek medical attention immediately by calling 911 or calling your MD immediately  if symptoms less severe.  You Must read complete instructions/literature along with all the possible adverse reactions/side effects for all the Medicines you take and that have been prescribed to you. Take any new Medicines after you have completely understood and accpet all the possible adverse reactions/side effects.   Increase activity slowly   Complete by: As directed    MyChart COVID-19 home monitoring program   Complete by: Nov 12, 2018    Is the patient willing to use the [place] for home monitoring?: Yes   Temperature monitoring   Complete by: Nov 12, 2018    After how many days would you like to receive a notification of this patient's flowsheet entries?: 1      Discharge Medications   Allergies as of 11/12/2018      Reactions   Lisinopril Swelling, Other (See Comments)   Mouth and tongue swells      Medication List    TAKE these medications   apixaban 5 MG Tabs tablet Commonly known as: Eliquis Take 1 tablet (5 mg total) by mouth 2 (two)  times daily.   atorvastatin 40 MG tablet Commonly known as: LIPITOR Take 1 tablet (40 mg total) by mouth daily at 6 PM.   BiDil 20-37.5 MG tablet Generic drug: isosorbide-hydrALAZINE TAKE 1 TABLET BY MOUTH THREE TIMES A DAY   clopidogrel 75 MG tablet Commonly known as: PLAVIX Take 1 tablet (75 mg total) by mouth daily with breakfast.   cycloSPORINE  0.05 % ophthalmic emulsion Commonly known as: RESTASIS Place 1 drop into both eyes daily as needed (dry eye).   furosemide 20 MG tablet Commonly known as: LASIX Take 3 tablets (60 mg total) by mouth daily. What changed:   medication strength  how much to take   metoprolol succinate 50 MG 24 hr tablet Commonly known as: TOPROL-XL Take 50 mg by mouth daily.   nitroGLYCERIN 0.4 MG SL tablet Commonly known as: NITROSTAT Place 1 tablet (0.4 mg total) under the tongue every 5 (five) minutes as needed for chest pain. up to 3 doses.   predniSONE 5 MG tablet Commonly known as: DELTASONE Label  & dispense according to the schedule below. take 8 Pills PO for 3 days, 6 Pills PO for 3 days, 4 Pills PO for 3 days, 2 Pills PO for 3 days, 1 Pills PO for 3 days, 1/2 Pill  PO for 3 days then STOP. Total 65 pills.            Durable Medical Equipment  (From admission, onward)         Start     Ordered   11/11/18 1348  Heart failure home health orders  (Heart failure home health orders / Face to face)  Once    Comments: Heart Failure Follow-up Care:  Verify follow-up appointments per Patient Discharge Instructions. Confirm transportation arranged. Reconcile home medications with discharge medication list. Remove discontinued medications from use. Assist patient/caregiver to manage medications using pill box. Reinforce low sodium food selection Assessments: Vital signs and oxygen saturation at each visit. Assess home environment for safety concerns, caregiver support and availability of low-sodium foods. Consult Education officer, museum, PT/OT,  Dietitian, and CNA based on assessments. Perform comprehensive cardiopulmonary assessment. Notify MD for any change in condition or weight gain of 3 pounds in one day or 5 pounds in one week with symptoms. Daily Weights and Symptom Monitoring: Ensure patient has access to scales. Teach patient/caregiver to weigh daily before breakfast and after voiding using same scale and record.    Teach patient/caregiver to track weight and symptoms and when to notify Provider. Activity: Develop individualized activity plan with patient/caregiver.   Question Answer Comment  Heart Failure Follow-up Care [facility] at [phone]   Lab frequency Other see comments   Fax lab results to Other see comments   Diet Low Sodium Heart Healthy   Fluid restrictions: 1500 mL Fluid      11/11/18 1348          Follow-up Information    Rosita Fire, MD. Schedule an appointment as soon as possible for a visit in 1 week(s).   Specialty: Internal Medicine Contact information: [place] [address] [phone]        [facility] Follow up.   Specialty: [place] Why: A representative from [facility] will contact you to arrange start date for [facility] RN for Heart Failure program Contact information: [place] [address] [phone]        Erma Heritage, PA-C Follow up on 12/07/2018.   Specialties: Physician Assistant, Cardiology Why: Please arrive 15 minutes early for your 2:30pm post-hospital follow-up appointment Contact information: [place] [address] [phone]           Major procedures and Radiology Reports - PLEASE review detailed and final reports thoroughly  -         Dg Chest Port 1  View  Result Date: 11/12/2018 CLINICAL DATA:  Shortness of breath. EXAM: PORTABLE CHEST 1 VIEW COMPARISON:  11/11/2018 FINDINGS: Prior CABG. Stable cardiomegaly. Stable  diffuse interstitial prominence. Small right pleural effusion cannot be excluded. No pneumothorax. IMPRESSION: 1.  Prior CABG.  Stable cardiomegaly. 2. Unchanged bilateral interstitial prominence suggesting interstitial edema and/or pneumonitis. Small right pleural effusion cannot be excluded. Chest is unchanged from prior exam. Electronically Signed   By: Marcello Moores  Register   On: 11/12/2018 08:37   Dg Chest Port 1 View  Result Date: 11/11/2018 CLINICAL DATA:  COVID-19 positivity.  Shortness of breath EXAM: PORTABLE CHEST 1 VIEW COMPARISON:  Yesterday FINDINGS: Interstitial coarsening at the lung bases is stable. No effusion or pneumothorax. Stable heart size. Prior CABG. IMPRESSION: Stable interstitial opacity which could be congestive or from atypical infection. Electronically Signed   By: Monte Fantasia M.D.   On: 11/11/2018 08:55   Dg Chest Portable 1 View  Result Date: 11/10/2018 CLINICAL DATA:  Shortness of breath EXAM: PORTABLE CHEST 1 VIEW COMPARISON:  06/20/2018 FINDINGS: Stable cardiomediastinal contours. Post CABG changes. There is pulmonary vascular congestion. Perihilar interstitial opacities with more confluent, patchy opacities in the bilateral lung bases. No pneumothorax. IMPRESSION: Cardiomegaly with pulmonary vascular congestion and diffuse bilateral interstitial airspace opacities suggesting CHF/pulmonary edema. More confluent opacities in the lung bases may represent superimposed pneumonia. Electronically Signed   By: Davina Poke M.D.   On: 11/10/2018 10:28    Micro Results    Recent Results (from the past 240 hour(s))  Blood culture (routine x 2)     Status: None (Preliminary result)   Collection Time: 11/10/18 11:11 AM   Specimen: BLOOD RIGHT HAND  Result Value Ref Range Status   Specimen Description BLOOD RIGHT HAND  Final   Special Requests   Final    BOTTLES DRAWN AEROBIC AND ANAEROBIC Blood Culture adequate volume   Culture   Final    NO GROWTH 2 DAYS Performed at  [facility], [address] [place], [place] [address]    Report Status PENDING  Incomplete  Blood culture (routine x 2)     Status: None (Preliminary result)   Collection Time: 11/10/18 11:12 AM   Specimen: BLOOD LEFT HAND  Result Value Ref Range Status   Specimen Description BLOOD LEFT HAND  Final   Special Requests   Final    BOTTLES DRAWN AEROBIC AND ANAEROBIC Blood Culture adequate volume   Culture   Final    NO GROWTH 2 DAYS Performed at [facility], [address] [place], [place] [address]    Report Status PENDING  Incomplete  SARS Coronavirus 2 [facility] order, Performed in [facility] hospital lab) Nasopharyngeal Nasopharyngeal Swab     Status: Abnormal   Collection Time: 11/10/18 12:26 PM   Specimen: Nasopharyngeal Swab  Result Value Ref Range Status   SARS Coronavirus 2 POSITIVE (A) NEGATIVE Final    Comment: RESULT CALLED TO, READ BACK BY AND VERIFIED WITH: MARTIN D. AT 1328 ON [id number] BY THOMPSON S. Performed at [facility], [address], [place], [place] [address]     Today   Subjective    [name] today has no headache,no chest abdominal pain,no new weakness tingling or numbness, feels much better wants to go home today.     Objective   Blood pressure 135/75, pulse 71, temperature 97.9 F (36.6 C), temperature source Oral, resp. rate 16, height 5\' 6"  (1.676 m), weight 96.4 kg, SpO2 94 %.   Intake/Output Summary (  Last 24 hours) at 11/12/2018 0916 Last data filed at 11/12/2018 0900 Gross per 24 hour  Intake 480 ml  Output 450 ml  Net 30 ml    Exam  Awake Alert, Oriented x 3, No new F.N deficits, Normal affect White.AT,PERRAL Supple Neck,No JVD, No cervical lymphadenopathy appriciated.  Symmetrical Chest wall movement, Good air movement bilaterally, CTAB RRR,No Gallops,Rubs or new Murmurs, No Parasternal Heave +ve B.Sounds, Abd Soft, Non tender, No organomegaly appriciated, No rebound -guarding or rigidity. No Cyanosis, Clubbing or edema, No new  Rash or bruise   Data Review   CBC w Diff:  Lab Results  Component Value Date   WBC 21.1 (H) 11/12/2018   HGB 15.1 11/12/2018   HCT 47.3 11/12/2018   PLT 284 11/12/2018   LYMPHOPCT 9 11/12/2018   MONOPCT 2 11/12/2018   EOSPCT 0 11/12/2018   BASOPCT 0 11/12/2018    CMP:  Lab Results  Component Value Date   NA 139 11/12/2018   K 4.9 11/12/2018   CL 104 11/12/2018   CO2 26 11/12/2018   BUN 31 (H) 11/12/2018   CREATININE 1.54 (H) 11/12/2018   PROT 8.2 (H) 11/12/2018   ALBUMIN 3.6 11/12/2018   BILITOT 1.3 (H) 11/12/2018   ALKPHOS 50 11/12/2018   AST 45 (H) 11/12/2018   ALT 58 (H) 11/12/2018  . COVID-19 Labs  Recent Labs    11/10/18 1005 11/10/18 1048 11/11/18 0230 11/12/18 0249  DDIMER 0.74*  --  0.67* 0.60*  FERRITIN  --   --  412* 457*  LDH  --   --  334* 286*  CRP  --  3.9* 3.7* 2.0*    Lab Results  Component Value Date   SARSCOV2NAA POSITIVE (A) 11/10/2018     Total Time in preparing paper work, data evaluation and todays exam - 35 minutes  Lala Lund M.D on 11/12/2018 at 9:16 AM  [facility]   Office  [phone]

## 2018-11-12 NOTE — Discharge Instructions (Signed)
Follow with Primary MD Rosita Fire, MD in 7 days   Get CBC, CMP, 2 view Chest X ray -  checked next visit within 1 week by Primary MD and get your Lasix dose adjusted and monitored closely.  Activity: As tolerated with Full fall precautions use Pfost/cane & assistance as needed  Disposition Home   Diet: Heart Healthy Low Carb  For Heart failure patients - Check your Weight same time everyday, if you gain over 2 pounds, or you develop in leg swelling, experience more shortness of breath or chest pain, call your Primary MD immediately. Follow Cardiac Low Salt Diet and 1.5 lit/day fluid restriction.  Special Instructions: If you have smoked or chewed Tobacco  in the last 2 yrs please stop smoking, stop any regular Alcohol  and or any Recreational drug use.  On your next visit with your primary care physician please Get Medicines reviewed and adjusted.  Please request your Prim.MD to go over all Hospital Tests and Procedure/Radiological results at the follow up, please get all Hospital records sent to your Prim MD by signing hospital release before you go home.  If you experience worsening of your admission symptoms, develop shortness of breath, life threatening emergency, suicidal or homicidal thoughts you must seek medical attention immediately by calling 911 or calling your MD immediately  if symptoms less severe.  You Must read complete instructions/literature along with all the possible adverse reactions/side effects for all the Medicines you take and that have been prescribed to you. Take any new Medicines after you have completely understood and accpet all the possible adverse reactions/side effects.        Person Under Monitoring Name: [name]  Location: [place] [address]   Infection Prevention Recommendations for Individuals Confirmed to have, or Being Evaluated for, 2019 Novel Coronavirus (COVID-19) Infection Who Receive Care at Home  Individuals  who are confirmed to have, or are being evaluated for, COVID-19 should follow the prevention steps below until a healthcare provider or local or state health department says they can return to normal activities.  Stay home except to get medical care You should restrict activities outside your home, except for getting medical care. Do not go to work, school, or public areas, and do not use public transportation or taxis.  Call ahead before visiting your doctor Before your medical appointment, call the healthcare provider and tell them that you have, or are being evaluated for, COVID-19 infection. This will help the healthcare providers office take steps to keep other people from getting infected. Ask your healthcare provider to call the local or state health department.  Monitor your symptoms Seek prompt medical attention if your illness is worsening (e.g., difficulty breathing). Before going to your medical appointment, call the healthcare provider and tell them that you have, or are being evaluated for, COVID-19 infection. Ask your healthcare provider to call the local or state health department.  Wear a facemask You should wear a facemask that covers your nose and mouth when you are in the same room with other people and when you visit a healthcare provider. People who live with or visit you should also wear a facemask while they are in the same room with you.  Separate yourself from other people in your home As much as possible, you should stay in a different room from other people in your home. Also, you should use a separate bathroom, if available.  Avoid sharing household items You should not share  dishes, drinking glasses, cups, eating utensils, towels, bedding, or other items with other people in your home. After using these items, you should wash them thoroughly with soap and water.  Cover your coughs and sneezes Cover your mouth and nose with a tissue when you cough or  sneeze, or you can cough or sneeze into your sleeve. Throw used tissues in a lined trash can, and immediately wash your hands with soap and water for at least 20 seconds or use an alcohol-based hand rub.  Wash your [facility] your hands often and thoroughly with soap and water for at least 20 seconds. You can use an alcohol-based hand sanitizer if soap and water are not available and if your hands are not visibly dirty. Avoid touching your eyes, nose, and mouth with unwashed hands.   Prevention Steps for Caregivers and Household Members of Individuals Confirmed to have, or Being Evaluated for, COVID-19 Infection Being Cared for in the Home  If you live with, or provide care at home for, a person confirmed to have, or being evaluated for, COVID-19 infection please follow these guidelines to prevent infection:  Follow healthcare providers instructions Make sure that you understand and can help the patient follow any healthcare provider instructions for all care.  Provide for the patients basic needs You should help the patient with basic needs in the home and provide support for getting groceries, prescriptions, and other personal needs.  Monitor the patients symptoms If they are getting sicker, call his or her medical provider and tell them that the patient has, or is being evaluated for, COVID-19 infection. This will help the healthcare providers office take steps to keep other people from getting infected. Ask the healthcare provider to call the local or state health department.  Limit the number of people who have contact with the patient  If possible, have only one caregiver for the patient.  Other household members should stay in another home or place of residence. If this is not possible, they should stay  in another room, or be separated from the patient as much as possible. Use a separate bathroom, if available.  Restrict visitors who do not have an essential need to be  in the home.  Keep older adults, very young children, and other sick people away from the patient Keep older adults, very young children, and those who have compromised immune systems or chronic health conditions away from the patient. This includes people with chronic heart, lung, or kidney conditions, diabetes, and cancer.  Ensure good ventilation Make sure that shared spaces in the home have good air flow, such as from an air conditioner or an opened window, weather permitting.  Wash your hands often  Wash your hands often and thoroughly with soap and water for at least 20 seconds. You can use an alcohol based hand sanitizer if soap and water are not available and if your hands are not visibly dirty.  Avoid touching your eyes, nose, and mouth with unwashed hands.  Use disposable paper towels to dry your hands. If not available, use dedicated cloth towels and replace them when they become wet.  Wear a facemask and gloves  Wear a disposable facemask at all times in the room and gloves when you touch or have contact with the patients blood, body fluids, and/or secretions or excretions, such as sweat, saliva, sputum, nasal mucus, vomit, urine, or feces.  Ensure the mask fits over your nose and mouth tightly, and do not touch it during  use.  Throw out disposable facemasks and gloves after using them. Do not reuse.  Wash your hands immediately after removing your facemask and gloves.  If your personal clothing becomes contaminated, carefully remove clothing and launder. Wash your hands after handling contaminated clothing.  Place all used disposable facemasks, gloves, and other waste in a lined container before disposing them with other household waste.  Remove gloves and wash your hands immediately after handling these items.  Do not share dishes, glasses, or other household items with the patient  Avoid sharing household items. You should not share dishes, drinking glasses, cups,  eating utensils, towels, bedding, or other items with a patient who is confirmed to have, or being evaluated for, COVID-19 infection.  After the person uses these items, you should wash them thoroughly with soap and water.  Wash laundry thoroughly  Immediately remove and wash clothes or bedding that have blood, body fluids, and/or secretions or excretions, such as sweat, saliva, sputum, nasal mucus, vomit, urine, or feces, on them.  Wear gloves when handling laundry from the patient.  Read and follow directions on labels of laundry or clothing items and detergent. In general, wash and dry with the warmest temperatures recommended on the label.  Clean all areas the individual has used often  Clean all touchable surfaces, such as counters, tabletops, doorknobs, bathroom fixtures, toilets, phones, keyboards, tablets, and bedside tables, every day. Also, clean any surfaces that may have blood, body fluids, and/or secretions or excretions on them.  Wear gloves when cleaning surfaces the patient has come in contact with.  Use a diluted bleach solution (e.g., dilute bleach with 1 part bleach and 10 parts water) or a household disinfectant with a label that says EPA-registered for coronaviruses. To make a bleach solution at home, add 1 tablespoon of bleach to 1 quart (4 cups) of water. For a larger supply, add  cup of bleach to 1 gallon (16 cups) of water.  Read labels of cleaning products and follow recommendations provided on product labels. Labels contain instructions for safe and effective use of the cleaning product including precautions you should take when applying the product, such as wearing gloves or eye protection and making sure you have good ventilation during use of the product.  Remove gloves and wash hands immediately after cleaning.  Monitor yourself for signs and symptoms of illness Caregivers and household members are considered close contacts, should monitor their health, and  will be asked to limit movement outside of the home to the extent possible. Follow the monitoring steps for close contacts listed on the symptom monitoring form.   ? If you have additional questions, contact your local health department or call the epidemiologist on call at [phone] (available 24/7). ? This guidance is subject to change. For the most up-to-date guidance from [facility], please refer to their website: [email or web address]

## 2018-11-15 LAB — CULTURE, BLOOD (ROUTINE X 2)
Culture: NO GROWTH
Culture: NO GROWTH
Special Requests: ADEQUATE
Special Requests: ADEQUATE

## 2018-12-07 ENCOUNTER — Ambulatory Visit: Payer: Medicare Other | Admitting: Student

## 2018-12-07 NOTE — Progress Notes (Deleted)
Cardiology Office Note    Date:  12/07/2018   ID:  [name], DOB October 19, 1945, MRN [id number]  PCP:  Rosita Fire, MD  Cardiologist: Kate Sable, MD    No chief complaint on file.   History of Present Illness:    [name] is a 73 y.o. male with past medical history of CAD (s/p CABG in 2011, DES to RCA in 08/2017, NSTEMI in 06/2018 with cath showing patent RCA stents with patent LIMA-LAD, SVG-OM1-OM2 and SVG-D1), chronic combined systolic and diastolic CHF (EF 123456 by echo in 06/2018), history of apical thrombus, history of PE, HTN, HLD, and OSA who presents to the office today for hospital follow-up.  He most recently had a telehealth visit with Dr. Bronson Ing in 07/2018 and was overall doing well from a great perspective at that time.  He denied any recent chest pain or dyspnea on exertion.  Was continued on his current medication regimen at that time including Plavix, Eliquis, Lipitor, Toprol-XL 50mg  daily, Lasix 20 mg daily, and BiDil.  He most recently presented to [facility] ED on 11/10/2018 for evaluation of worsening dyspnea to have an acute CHF exacerbation and community-acquired pneumonia.  COVID-19 testing was positive and he was admitted to [facility] for further management.  His symptoms improved with antibiotic therapy along with diuresis and Lasix was titrated from 20 mg daily to 60 mg daily upon discharge on 11/12/2018.  Past Medical History:  Diagnosis Date  . Apical mural thrombus   . Arteriosclerotic cardiovascular disease (ASCVD) 1998   Inferior MI->PCI of the RCA in 1998; acute anterior MI in 06/2002->  PCI of RCA and LAD with DES x2 to RCA, residual 80% ostial D1, and 80% mid CX and EF-40%; CABG-2011, LIMA-LAD, SVG to D1, OM1 & OM2; EF of 35-40% in 07/2010  . Chronic anticoagulation   . Chronic systolic CHF (congestive heart failure) ([place])   . CVA (cerebral vascular accident) ([place])   . HTN (hypertension)   . Hyperlipidemia   . Keloid    median  sternotomy  . Pulmonary embolism ([place]) 03/2010  . Substance abuse [facility])    formerly cocaine    Past Surgical History:  Procedure Laterality Date  . COLONOSCOPY N/A 01/22/2017   Procedure: COLONOSCOPY;  Surgeon: Danie Binder, MD;  [facility];  Service: Endoscopy;  Laterality: N/A;  200  . CORONARY ARTERY BYPASS GRAFT  03/18/2010   LIMA-LAD, SVG to diagonal, OM1 & OM2  . CORONARY STENT INTERVENTION N/A 08/21/2017   Procedure: CORONARY STENT INTERVENTION;  Surgeon: [name], MD;  Location: [place] CV LAB;  Service: Cardiovascular;  Laterality: N/A;  . LEFT HEART CATH AND CORS/GRAFTS ANGIOGRAPHY N/A 08/21/2017   Procedure: LEFT HEART CATH AND CORS/GRAFTS ANGIOGRAPHY;  Surgeon: [name], MD;  Location: [place] CV LAB;  Service: Cardiovascular;  Laterality: N/A;  . LEFT HEART CATH AND CORS/GRAFTS ANGIOGRAPHY N/A 06/21/2018   Procedure: LEFT HEART CATH AND CORS/GRAFTS ANGIOGRAPHY;  Surgeon: Nelva Bush, MD;  Location: [place] CV LAB;  Service: Cardiovascular;  Laterality: N/A;  . POLYPECTOMY  01/22/2017   Procedure: POLYPECTOMY;  Surgeon: Danie Binder, MD;  [facility];  Service: Endoscopy;;  Transverse(CS) and sigmoid colon(HS)  . PTCA  06/1996   LAD & RCA  . TEE WITHOUT CARDIOVERSION N/A 06/10/2012   Procedure: TRANSESOPHAGEAL ECHOCARDIOGRAM (TEE);  Surgeon: Josue Hector, MD;  [facility];  Service: Cardiovascular;  Laterality: N/A;    Current Medications:  Outpatient Medications Prior to Visit  Medication Sig Dispense Refill  . apixaban (ELIQUIS) 5 MG TABS tablet Take 1 tablet (5 mg total) by mouth 2 (two) times daily. 60 tablet 3  . atorvastatin (LIPITOR) 40 MG tablet Take 1 tablet (40 mg total) by mouth daily at 6 PM. 30 tablet 0  . BIDIL 20-37.5 MG tablet TAKE 1 TABLET BY MOUTH THREE TIMES A DAY (Patient taking differently: Take 1 tablet by mouth 3 (three) times daily. ) 90 tablet 6  . clopidogrel (PLAVIX) 75 MG  tablet Take 1 tablet (75 mg total) by mouth daily with breakfast. 30 tablet 3  . cycloSPORINE (RESTASIS) 0.05 % ophthalmic emulsion Place 1 drop into both eyes daily as needed (dry eye).     . furosemide (LASIX) 20 MG tablet Take 3 tablets (60 mg total) by mouth daily. 90 tablet 0  . metoprolol succinate (TOPROL-XL) 50 MG 24 hr tablet Take 50 mg by mouth daily.  3  . nitroGLYCERIN (NITROSTAT) 0.4 MG SL tablet Place 1 tablet (0.4 mg total) under the tongue every 5 (five) minutes as needed for chest pain. up to 3 doses. 25 tablet 4  . predniSONE (DELTASONE) 5 MG tablet Label  & dispense according to the schedule below. take 8 Pills PO for 3 days, 6 Pills PO for 3 days, 4 Pills PO for 3 days, 2 Pills PO for 3 days, 1 Pills PO for 3 days, 1/2 Pill  PO for 3 days then STOP. Total 65 pills. 65 tablet 0   No facility-administered medications prior to visit.      Allergies:   Lisinopril   Social History   Socioeconomic History  . Marital status: Widowed    Spouse name: Not on file  . Number of children: Not on file  . Years of education: Not on file  . Highest education level: Not on file  Occupational History  . Occupation: disability  Social Needs  . Financial resource strain: Not on file  . Food insecurity    Worry: Not on file    Inability: Not on file  . Transportation needs    Medical: Not on file    Non-medical: Not on file  Tobacco Use  . Smoking status: Former Smoker    Quit date: 2004    Years since quitting: 16.6  . Smokeless tobacco: Never Used  Substance and Sexual Activity  . Alcohol use: Yes    Comment: rare  . Drug use: No    Types: Cocaine    Comment: Former cocaine abuse - last use for 51th birthday  . Sexual activity: Not on file  Lifestyle  . Physical activity    Days per week: Not on file    Minutes per session: Not on file  . Stress: Not on file  Relationships  . Social Herbalist on phone: Not on file    Gets together: Not on file    Attends  religious service: Not on file    Active member of club or organization: Not on file    Attends meetings of clubs or organizations: Not on file    Relationship status: Not on file  Other Topics Concern  . Not on file  Social History Narrative  . Not on file     Family History:  The patient's ***family history includes Cancer (age of onset: 73) in his mother.   Review of Systems:   Please see the history of present illness.  General:  No chills, fever, night sweats or weight changes.  Cardiovascular:  No chest pain, dyspnea on exertion, edema, orthopnea, palpitations, paroxysmal nocturnal dyspnea. Dermatological: No rash, lesions/masses Respiratory: No cough, dyspnea Urologic: No hematuria, dysuria Abdominal:   No nausea, vomiting, diarrhea, bright red blood per rectum, melena, or hematemesis Neurologic:  No visual changes, wkns, changes in mental status. All other systems reviewed and are otherwise negative except as noted above.   Physical Exam:    VS:  There were no vitals taken for this visit.   General: Well developed, well nourished,male appearing in no acute distress. Head: Normocephalic, atraumatic, sclera non-icteric, no xanthomas, nares are without discharge.  Neck: No carotid bruits. JVD not elevated.  Lungs: Respirations regular and unlabored, without wheezes or rales.  Heart: ***Regular rate and rhythm. No S3 or S4.  No murmur, no rubs, or gallops appreciated. Abdomen: Soft, non-tender, non-distended with normoactive bowel sounds. No hepatomegaly. No rebound/guarding. No obvious abdominal masses. Msk:  Strength and tone appear normal for age. No joint deformities or effusions. Extremities: No clubbing or cyanosis. No edema.  Distal pedal pulses are 2+ bilaterally. Neuro: Alert and oriented X 3. Moves all extremities spontaneously. No focal deficits noted. Psych:  Responds to questions appropriately with a normal affect. Skin: No rashes or lesions noted  Wt  Readings from Last 3 Encounters:  11/10/18 212 lb 9.6 oz (96.4 kg)  07/30/18 224 lb (101.6 kg)  06/22/18 221 lb 14.4 oz (100.7 kg)        Studies/Labs Reviewed:   EKG:  EKG is*** ordered today.  The ekg ordered today demonstrates ***  Recent Labs: 03/25/2018: TSH 1.930 11/12/2018: ALT 58; B Natriuretic Peptide 108.7; BUN 31; Creatinine, Ser 1.54; Hemoglobin 15.1; Magnesium 2.4; Platelets 284; Potassium 4.9; Sodium 139   Lipid Panel    Component Value Date/Time   CHOL 79 06/22/2018 0340   TRIG 38 06/22/2018 0340   HDL 40 (L) 06/22/2018 0340   CHOLHDL 2.0 06/22/2018 0340   VLDL 8 06/22/2018 0340   LDLCALC 31 06/22/2018 0340    Additional studies/ records that were reviewed today include:   Cardiac Catheterization: 06/21/2018 1. Severe native coronary artery disease, including occluded proximal LAD and LCx. RCA with patent mid/distal stents and mild in-stent restenosis. 2. Widely patent LIMA-LAD, SVG-OM1-OM, and SVG-D. 3. Mildly elevated left ventricular filling pressure.  Recommendations: 1. Medical therapy; question coronary embolism with spontaneous lysis. Plan to restart heparin infusion 2 hours after TR band removal and resumption of apixaban tomorrow AM if no evidence of bleeding. 2. Continue clopidogrel 75 mg daily.  Defer continuing aspirin in the setting of long-term anticoagulation and clopidogrel use. 3. Aggressive secondary prevention.  Echocardiogram: 06/2018 IMPRESSIONS   1. The left ventricle has moderately reduced systolic function, with an ejection fraction of 35-40%. The cavity size was normal. There is mildly increased left ventricular wall thickness. Left ventricular diastolic Doppler parameters are consistent with  impaired relaxation. Indeterminate filling pressures The E/e' is 8-15.  2. Severe akinesis of the left ventricular, mid-apical inferior wall, anteroseptal wall, inferoseptal wall and anteroapical wall.  3. The right ventricle has normal  systolic function. The cavity was normal. There is no increase in right ventricular wall thickness.  4. The aortic valve was not well visualized.  5. The aortic root and ascending aorta are normal in size and structure.  6. The interatrial septum was not well visualized.  Assessment:    No diagnosis found.   Plan:   In order  of problems listed above:  1. ***    Medication Adjustments/Labs and Tests Ordered: Current medicines are reviewed at length with the patient today.  Concerns regarding medicines are outlined above.  Medication changes, Labs and Tests ordered today are listed in the Patient Instructions below. There are no Patient Instructions on file for this visit.   Signed, Erma Heritage, PA-C  12/07/2018 12:29 PM    [place]. [address] [place], [place] [address] Phone: [phone] Fax: [phone]

## 2019-04-23 DIAGNOSIS — N183 Chronic kidney disease, stage 3 unspecified: Secondary | ICD-10-CM | POA: Diagnosis not present

## 2019-04-23 DIAGNOSIS — I2510 Atherosclerotic heart disease of native coronary artery without angina pectoris: Secondary | ICD-10-CM | POA: Diagnosis not present

## 2019-05-24 DIAGNOSIS — I5022 Chronic systolic (congestive) heart failure: Secondary | ICD-10-CM | POA: Diagnosis not present

## 2019-05-24 DIAGNOSIS — I2510 Atherosclerotic heart disease of native coronary artery without angina pectoris: Secondary | ICD-10-CM | POA: Diagnosis not present

## 2019-06-21 DIAGNOSIS — N1831 Chronic kidney disease, stage 3a: Secondary | ICD-10-CM | POA: Diagnosis not present

## 2019-06-21 DIAGNOSIS — I2510 Atherosclerotic heart disease of native coronary artery without angina pectoris: Secondary | ICD-10-CM | POA: Diagnosis not present

## 2019-07-19 ENCOUNTER — Ambulatory Visit: Payer: Medicare Other | Attending: Internal Medicine

## 2019-07-19 DIAGNOSIS — Z23 Encounter for immunization: Secondary | ICD-10-CM

## 2019-07-19 DIAGNOSIS — I2510 Atherosclerotic heart disease of native coronary artery without angina pectoris: Secondary | ICD-10-CM | POA: Diagnosis not present

## 2019-07-19 DIAGNOSIS — Z1389 Encounter for screening for other disorder: Secondary | ICD-10-CM | POA: Diagnosis not present

## 2019-07-19 DIAGNOSIS — Z0001 Encounter for general adult medical examination with abnormal findings: Secondary | ICD-10-CM | POA: Diagnosis not present

## 2019-07-19 DIAGNOSIS — I10 Essential (primary) hypertension: Secondary | ICD-10-CM | POA: Diagnosis not present

## 2019-07-19 DIAGNOSIS — I5022 Chronic systolic (congestive) heart failure: Secondary | ICD-10-CM | POA: Diagnosis not present

## 2019-07-21 ENCOUNTER — Encounter: Payer: Self-pay | Admitting: Cardiovascular Disease

## 2019-07-21 ENCOUNTER — Ambulatory Visit (INDEPENDENT_AMBULATORY_CARE_PROVIDER_SITE_OTHER): Payer: Medicare Other | Admitting: Cardiovascular Disease

## 2019-07-21 ENCOUNTER — Other Ambulatory Visit: Payer: Self-pay

## 2019-07-21 VITALS — BP 130/80 | HR 94 | Ht 66.0 in | Wt 219.0 lb

## 2019-07-21 DIAGNOSIS — I429 Cardiomyopathy, unspecified: Secondary | ICD-10-CM

## 2019-07-21 DIAGNOSIS — I25708 Atherosclerosis of coronary artery bypass graft(s), unspecified, with other forms of angina pectoris: Secondary | ICD-10-CM

## 2019-07-21 DIAGNOSIS — R002 Palpitations: Secondary | ICD-10-CM | POA: Diagnosis not present

## 2019-07-21 DIAGNOSIS — Z8673 Personal history of transient ischemic attack (TIA), and cerebral infarction without residual deficits: Secondary | ICD-10-CM

## 2019-07-21 DIAGNOSIS — G4733 Obstructive sleep apnea (adult) (pediatric): Secondary | ICD-10-CM

## 2019-07-21 DIAGNOSIS — I10 Essential (primary) hypertension: Secondary | ICD-10-CM | POA: Diagnosis not present

## 2019-07-21 DIAGNOSIS — I5022 Chronic systolic (congestive) heart failure: Secondary | ICD-10-CM | POA: Diagnosis not present

## 2019-07-21 DIAGNOSIS — I513 Intracardiac thrombosis, not elsewhere classified: Secondary | ICD-10-CM

## 2019-07-21 DIAGNOSIS — E785 Hyperlipidemia, unspecified: Secondary | ICD-10-CM

## 2019-07-21 DIAGNOSIS — I252 Old myocardial infarction: Secondary | ICD-10-CM

## 2019-07-21 DIAGNOSIS — Z955 Presence of coronary angioplasty implant and graft: Secondary | ICD-10-CM

## 2019-07-21 NOTE — Progress Notes (Signed)
SUBJECTIVE: [name] is a 74 y.o. male with coronary artery disease.  He sustained a non-STEMI and underwent cardiac catheterization on 06/21/2018.  This demonstrated severe native coronary artery disease including occluded proximal LAD and left circumflex.  The RCA showed stent patency in the mid to distal region and mild in-stent restenosis.  The LIMA to LAD, sequential SVG to OM1-OM2, and SVG to diagonal were all widely patent.  LVEDP was mildly elevated.  There was a question as to whether or not an intracoronary embolism demonstrated spontaneous lysis. Aggressive secondary prevention was recommended.  Echocardiogram on 06/22/2018 demonstrated moderately reduced left ventricular systolic function, LVEF 35 to 40%.  There were wall motion abnormalities.  Contrast was used and demonstrated scar.  There was no thrombus seen.  The patient denies any symptoms of chest pain, palpitations, shortness of breath, lightheadedness, dizziness, leg swelling, orthopnea, PND, and syncope.  He is supposed to get blood work done tomorrow ordered by his PCP.    Review of Systems: As per "subjective", otherwise negative.  Allergies  Allergen Reactions  . Lisinopril Swelling and Other (See Comments)    Mouth and tongue swells    Current Outpatient Medications  Medication Sig Dispense Refill  . apixaban (ELIQUIS) 5 MG TABS tablet Take 1 tablet (5 mg total) by mouth 2 (two) times daily. 60 tablet 3  . atorvastatin (LIPITOR) 40 MG tablet Take 1 tablet (40 mg total) by mouth daily at 6 PM. 30 tablet 0  . BIDIL 20-37.5 MG tablet TAKE 1 TABLET BY MOUTH THREE TIMES A DAY (Patient taking differently: Take 1 tablet by mouth 3 (three) times daily. ) 90 tablet 6  . clopidogrel (PLAVIX) 75 MG tablet Take 1 tablet (75 mg total) by mouth daily with breakfast. 30 tablet 3  . cycloSPORINE (RESTASIS) 0.05 % ophthalmic emulsion Place 1 drop into both eyes daily as needed (dry eye).     . furosemide (LASIX) 20  MG tablet Take 3 tablets (60 mg total) by mouth daily. 90 tablet 0  . metoprolol succinate (TOPROL-XL) 50 MG 24 hr tablet Take 50 mg by mouth daily.  3  . nitroGLYCERIN (NITROSTAT) 0.4 MG SL tablet Place 1 tablet (0.4 mg total) under the tongue every 5 (five) minutes as needed for chest pain. up to 3 doses. 25 tablet 4  . predniSONE (DELTASONE) 5 MG tablet Label  & dispense according to the schedule below. take 8 Pills PO for 3 days, 6 Pills PO for 3 days, 4 Pills PO for 3 days, 2 Pills PO for 3 days, 1 Pills PO for 3 days, 1/2 Pill  PO for 3 days then STOP. Total 65 pills. 65 tablet 0   No current facility-administered medications for this visit.    Past Medical History:  Diagnosis Date  . Apical mural thrombus   . Arteriosclerotic cardiovascular disease (ASCVD) 1998   Inferior MI->PCI of the RCA in 1998; acute anterior MI in 06/2002->  PCI of RCA and LAD with DES x2 to RCA, residual 80% ostial D1, and 80% mid CX and EF-40%; CABG-2011, LIMA-LAD, SVG to D1, OM1 & OM2; EF of 35-40% in 07/2010  . Chronic anticoagulation   . Chronic systolic CHF (congestive heart failure) ([place])   . CVA (cerebral vascular accident) ([place])   . HTN (hypertension)   . Hyperlipidemia   . Keloid    median sternotomy  . Pulmonary embolism ([place]) 03/2010  . Substance abuse ([place])    formerly cocaine  Past Surgical History:  Procedure Laterality Date  . COLONOSCOPY N/A 01/22/2017   Procedure: COLONOSCOPY;  Surgeon: Danie Binder, MD;  [facility];  Service: Endoscopy;  Laterality: N/A;  200  . CORONARY ARTERY BYPASS GRAFT  03/18/2010   LIMA-LAD, SVG to diagonal, OM1 & OM2  . CORONARY STENT INTERVENTION N/A 08/21/2017   Procedure: CORONARY STENT INTERVENTION;  Surgeon: [name], MD;  Location: [facility];  Service: Cardiovascular;  Laterality: N/A;  . LEFT HEART CATH AND CORS/GRAFTS ANGIOGRAPHY N/A 08/21/2017   Procedure: LEFT HEART CATH AND CORS/GRAFTS ANGIOGRAPHY;  Surgeon: [name], MD;  Location: [facility] [facility];  Service: Cardiovascular;  Laterality: N/A;  . LEFT HEART CATH AND CORS/GRAFTS ANGIOGRAPHY N/A 06/21/2018   Procedure: LEFT HEART CATH AND CORS/GRAFTS ANGIOGRAPHY;  Surgeon: Nelva Bush, MD;  Location: [facility];  Service: Cardiovascular;  Laterality: N/A;  . POLYPECTOMY  01/22/2017   Procedure: POLYPECTOMY;  Surgeon: Danie Binder, MD;  [facility];  Service: Endoscopy;;  Transverse(CS) and sigmoid colon(HS)  . PTCA  06/1996   LAD & RCA  . TEE WITHOUT CARDIOVERSION N/A 06/10/2012   Procedure: TRANSESOPHAGEAL ECHOCARDIOGRAM (TEE);  Surgeon: Josue Hector, MD;  [facility];  Service: Cardiovascular;  Laterality: N/A;    Social History   Socioeconomic History  . Marital status: Widowed    Spouse name: Not on file  . Number of children: Not on file  . Years of education: Not on file  . Highest education level: Not on file  Occupational History  . Occupation: disability  Tobacco Use  . Smoking status: Former Smoker    Quit date: 2004    Years since quitting: 17.2  . Smokeless tobacco: Never Used  Substance and Sexual Activity  . Alcohol use: Yes    Comment: rare  . Drug use: No    Types: Cocaine    Comment: Former cocaine abuse - last use for 51th birthday  . Sexual activity: Not on file  Other Topics Concern  . Not on file  Social History Narrative  . Not on file   Social Determinants of Health   Financial Resource Strain:   . Difficulty of Paying Living Expenses:   Food Insecurity:   . Worried About Charity fundraiser in the Last Year:   . Arboriculturist in the Last Year:   Transportation Needs:   . Film/video editor (Medical):   Marland Kitchen Lack of Transportation (Non-Medical):   Physical Activity:   . Days of Exercise per Week:   . Minutes of Exercise per Session:   Stress:   . Feeling of Stress :   Social Connections:   . Frequency of Communication with Friends and Family:   .  Frequency of Social Gatherings with Friends and Family:   . Attends Religious Services:   . Active Member of Clubs or Organizations:   . Attends Archivist Meetings:   Marland Kitchen Marital Status:   Intimate Partner Violence:   . Fear of Current or Ex-Partner:   . Emotionally Abused:   Marland Kitchen Physically Abused:   . Sexually Abused:      Vitals:   07/21/19 1019  BP: 130/80  Pulse: 94  SpO2: 98%  Weight: 219 lb (99.3 kg)  Height: 5\' 6"  (1.676 m)    Wt Readings from Last 3 Encounters:  07/21/19 219 lb (99.3 kg)  11/10/18 212 lb 9.6 oz (96.4 kg)  07/30/18  224 lb (101.6 kg)     PHYSICAL EXAM General: NAD HEENT: Normal. Neck: No JVD, no thyromegaly. Lungs: Clear to auscultation bilaterally with normal respiratory effort. CV: Regular rate and rhythm, normal S1/S2, no S3/S4, no murmur. No pretibial or periankle edema.  No carotid bruit.   Abdomen: Soft, nontender, no distention.  Neurologic: Alert and oriented.  Psych: Normal affect. Skin: Normal. Musculoskeletal: No gross deformities.      Labs: Lab Results  Component Value Date/Time   K 4.9 11/12/2018 02:49 AM   BUN 31 (H) 11/12/2018 02:49 AM   CREATININE 1.54 (H) 11/12/2018 02:49 AM   ALT 58 (H) 11/12/2018 02:49 AM   TSH 1.930 03/25/2018 08:30 AM   TSH 1.043 06/07/2012 09:58 AM   HGB 15.1 11/12/2018 02:49 AM     Lipids: Lab Results  Component Value Date/Time   LDLCALC 31 06/22/2018 03:40 AM   CHOL 79 06/22/2018 03:40 AM   TRIG 38 06/22/2018 03:40 AM   HDL 40 (L) 06/22/2018 03:40 AM       ASSESSMENT AND PLAN:  1. CAD with CABGstatus post drug-eluting stent placement to the [id number] ischemic heart disease.  Non-STEMI in March 2020 with cardiac catheterization results detailed above.  Continue metoprololsuccinateandatorvastatin 40 mg. Allergic to ACE inhibitors/angiotensin receptor blockers. No aspirin as he is on Plavix.  Given the nature of non-STEMI, I may continue Plavix indefinitely.  2.  Hyperlipidemia: Lipid panel from 06/22/2018 reviewed with an LDL of 31. ContinueLipitor 40 mg.  He is supposed to get blood work done tomorrow ordered by his PCP.  3. H/o [id number] on Eliquis 5 mg twice daily as he has a history of cerebral thrombosis.  4. Essential HTN:BP is normal.  ContinueBiDil 20/37.5 mg 3 times daily.  5. Chronic systolic heart failure, LVEF 35 to 40%: Currently on Lasix60 mg daily (previously on 20 mg daily when I last evaluated him and dose was increased by his PCP) and Toprol-XL 50 mg daily. Allergic to ACE inhibitors/angiotensin receptor blockers.I willcontinueBiDil 20/37.5 mg 3 times daily.  He is supposed to get blood work done tomorrow ordered by his PCP.  6.Apical thrombus: Currently on Eliquis 5 mg twice daily(also with a history of cerebral thrombosis).No apical thrombus seen on most recent echocardiogram in March 2020 as detailed above.  7. Palpitations: Symptomatically stable.  Event monitoring showed PACs and PVCs with nonsustained ventricular tachycardia.  Continue Toprol-XL.  8. Obstructive sleep apnea: Not using CPAP. I encouraged compliance.    Disposition: Follow up 6 months   Kate Sable, M.D., F.A.C.C.

## 2019-07-21 NOTE — Patient Instructions (Signed)
Medication Instructions:  Your physician recommends that you continue on your current medications as directed. Please refer to the Current Medication list given to you today.  *If you need a refill on your cardiac medications before your next appointment, please call your pharmacy*   Lab Work: Get lab work done tomorrow  If you have labs (blood work) drawn today and your tests are completely normal, you will receive your results only by: Marland Kitchen MyChart Message (if you have MyChart) OR . A paper copy in the mail If you have any lab test that is abnormal or we need to change your treatment, we will call you to review the results.   Testing/Procedures: None today   Follow-Up: At [facility], you and your health needs are our priority.  As part of our continuing mission to provide you with exceptional heart care, we have created designated Provider Care Teams.  These Care Teams include your primary Cardiologist (physician) and Advanced Practice Providers (APPs -  Physician Assistants and Nurse Practitioners) who all work together to provide you with the care you need, when you need it.  We recommend signing up for the patient portal called "MyChart".  Sign up information is provided on this After Visit Summary.  MyChart is used to connect with patients for Virtual Visits (Telemedicine).  Patients are able to view lab/test results, encounter notes, upcoming appointments, etc.  Non-urgent messages can be sent to your provider as well.   To learn more about what you can do with MyChart, go to [email or web address].    Your next appointment:   6 month(s)  The format for your next appointment:   In Person  Provider:   Kate Sable, MD   Other Instructions None       Thank you for choosing [facility] !

## 2019-07-22 DIAGNOSIS — I10 Essential (primary) hypertension: Secondary | ICD-10-CM | POA: Diagnosis not present

## 2019-07-22 DIAGNOSIS — Z0001 Encounter for general adult medical examination with abnormal findings: Secondary | ICD-10-CM | POA: Diagnosis not present

## 2019-07-22 DIAGNOSIS — I2510 Atherosclerotic heart disease of native coronary artery without angina pectoris: Secondary | ICD-10-CM | POA: Diagnosis not present

## 2019-07-22 DIAGNOSIS — N1831 Chronic kidney disease, stage 3a: Secondary | ICD-10-CM | POA: Diagnosis not present

## 2019-08-18 DIAGNOSIS — I10 Essential (primary) hypertension: Secondary | ICD-10-CM | POA: Diagnosis not present

## 2019-08-18 DIAGNOSIS — I2510 Atherosclerotic heart disease of native coronary artery without angina pectoris: Secondary | ICD-10-CM | POA: Diagnosis not present

## 2019-08-23 ENCOUNTER — Ambulatory Visit: Payer: Medicare Other | Attending: Internal Medicine

## 2019-08-23 DIAGNOSIS — Z23 Encounter for immunization: Secondary | ICD-10-CM

## 2019-08-23 NOTE — Progress Notes (Signed)
   Covid-19 Vaccination Clinic  Name:  [name]    MRN: [id number] DOB: 1946-03-30  08/23/2019  [name] was observed post Covid-19 immunization for 15 minutes without incident. He was provided with Vaccine Information Sheet and instruction to access the V-Safe system.   [name] was instructed to call 911 with any severe reactions post vaccine: Marland Kitchen Difficulty breathing  . Swelling of face and throat  . A fast heartbeat  . A bad rash all over body  . Dizziness and weakness   Immunizations Administered    Name Date Dose VIS Date Route   Moderna COVID-19 Vaccine 08/23/2019 10:36 AM 0.5 mL 03/2019 Intramuscular   Manufacturer: Moderna   Lot: [id number]   [id number]

## 2019-09-20 DIAGNOSIS — N183 Chronic kidney disease, stage 3 unspecified: Secondary | ICD-10-CM | POA: Diagnosis not present

## 2019-09-20 DIAGNOSIS — I2510 Atherosclerotic heart disease of native coronary artery without angina pectoris: Secondary | ICD-10-CM | POA: Diagnosis not present

## 2019-10-10 ENCOUNTER — Other Ambulatory Visit: Payer: Self-pay

## 2019-10-10 ENCOUNTER — Emergency Department (HOSPITAL_COMMUNITY): Payer: Medicare Other

## 2019-10-10 ENCOUNTER — Encounter (HOSPITAL_COMMUNITY): Payer: Self-pay

## 2019-10-10 ENCOUNTER — Emergency Department (HOSPITAL_COMMUNITY)
Admission: EM | Admit: 2019-10-10 | Discharge: 2019-10-11 | Disposition: A | Discharge status: Home / Self Care | Payer: Medicare Other | Attending: Emergency Medicine | Admitting: Emergency Medicine

## 2019-10-10 DIAGNOSIS — Z87891 Personal history of nicotine dependence: Secondary | ICD-10-CM | POA: Diagnosis not present

## 2019-10-10 DIAGNOSIS — R42 Dizziness and giddiness: Secondary | ICD-10-CM | POA: Insufficient documentation

## 2019-10-10 DIAGNOSIS — N183 Chronic kidney disease, stage 3 unspecified: Secondary | ICD-10-CM | POA: Diagnosis not present

## 2019-10-10 DIAGNOSIS — I5022 Chronic systolic (congestive) heart failure: Secondary | ICD-10-CM | POA: Diagnosis not present

## 2019-10-10 DIAGNOSIS — I130 Hypertensive heart and chronic kidney disease with heart failure and stage 1 through stage 4 chronic kidney disease, or unspecified chronic kidney disease: Secondary | ICD-10-CM | POA: Diagnosis not present

## 2019-10-10 DIAGNOSIS — R29818 Other symptoms and signs involving the nervous system: Secondary | ICD-10-CM | POA: Diagnosis not present

## 2019-10-10 DIAGNOSIS — I25810 Atherosclerosis of coronary artery bypass graft(s) without angina pectoris: Secondary | ICD-10-CM | POA: Insufficient documentation

## 2019-10-10 DIAGNOSIS — I10 Essential (primary) hypertension: Secondary | ICD-10-CM | POA: Diagnosis not present

## 2019-10-10 DIAGNOSIS — Z79899 Other long term (current) drug therapy: Secondary | ICD-10-CM | POA: Diagnosis not present

## 2019-10-10 DIAGNOSIS — Z7901 Long term (current) use of anticoagulants: Secondary | ICD-10-CM | POA: Insufficient documentation

## 2019-10-10 LAB — CBC
HCT: 48.5 % (ref 39.0–52.0)
Hemoglobin: 15.1 g/dL (ref 13.0–17.0)
MCH: 28.3 pg (ref 26.0–34.0)
MCHC: 31.1 g/dL (ref 30.0–36.0)
MCV: 90.8 fL (ref 80.0–100.0)
Platelets: 155 10*3/uL (ref 150–400)
RBC: 5.34 MIL/uL (ref 4.22–5.81)
RDW: 13.9 % (ref 11.5–15.5)
WBC: 7.1 10*3/uL (ref 4.0–10.5)
nRBC: 0 % (ref 0.0–0.2)

## 2019-10-10 LAB — COMPREHENSIVE METABOLIC PANEL
ALT: 32 U/L (ref 0–44)
AST: 27 U/L (ref 15–41)
Albumin: 4 g/dL (ref 3.5–5.0)
Alkaline Phosphatase: 62 U/L (ref 38–126)
Anion gap: 7 (ref 5–15)
BUN: 11 mg/dL (ref 8–23)
CO2: 27 mmol/L (ref 22–32)
Calcium: 8.4 mg/dL — ABNORMAL LOW (ref 8.9–10.3)
Chloride: 105 mmol/L (ref 98–111)
Creatinine, Ser: 1.26 mg/dL — ABNORMAL HIGH (ref 0.61–1.24)
GFR calc Af Amer: >60 mL/min (ref >60)
GFR calc non Af Amer: 56 mL/min — ABNORMAL LOW (ref >60)
Glucose, Bld: 124 mg/dL — ABNORMAL HIGH (ref 70–99)
Potassium: 3.5 mmol/L (ref 3.5–5.1)
Sodium: 139 mmol/L (ref 135–145)
Total Bilirubin: 1.4 mg/dL — ABNORMAL HIGH (ref 0.3–1.2)
Total Protein: 7.3 g/dL (ref 6.5–8.1)

## 2019-10-10 LAB — DIFFERENTIAL
Abs Immature Granulocytes: 0.01 10*3/uL (ref 0.00–0.07)
Basophils Absolute: 0.1 10*3/uL (ref 0.0–0.1)
Basophils Relative: 1 %
Eosinophils Absolute: 0.2 10*3/uL (ref 0.0–0.5)
Eosinophils Relative: 3 %
Immature Granulocytes: 0 %
Lymphocytes Relative: 56 %
Lymphs Abs: 4 10*3/uL (ref 0.7–4.0)
Monocytes Absolute: 0.7 10*3/uL (ref 0.1–1.0)
Monocytes Relative: 10 %
Neutro Abs: 2.1 10*3/uL (ref 1.7–7.7)
Neutrophils Relative %: 30 %

## 2019-10-10 LAB — APTT: aPTT: 36 seconds (ref 24–36)

## 2019-10-10 LAB — PROTIME-INR
INR: 1.2 (ref 0.8–1.2)
Prothrombin Time: 14.4 seconds (ref 11.4–15.2)

## 2019-10-10 LAB — ETHANOL: Alcohol, Ethyl (B): <10 mg/dL (ref <10)

## 2019-10-10 LAB — CBG MONITORING, ED: Glucose-Capillary: 130 mg/dL — ABNORMAL HIGH (ref 70–99)

## 2019-10-10 NOTE — ED Triage Notes (Signed)
Pt comes in for complaints of dizziness starting tonight. Unsure of the time but it was "late"  Thinks around 10pm  cva 2 years ago.  No numbness no tingling. Alert and oriented.  Unsure if he takes blood thinners

## 2019-10-11 LAB — URINALYSIS, ROUTINE W REFLEX MICROSCOPIC
Bacteria, UA: NONE SEEN
Bilirubin Urine: NEGATIVE
Glucose, UA: NEGATIVE mg/dL
Hgb urine dipstick: NEGATIVE
Ketones, ur: NEGATIVE mg/dL
Leukocytes,Ua: NEGATIVE
Nitrite: NEGATIVE
Protein, ur: 30 mg/dL — AB
Specific Gravity, Urine: 1.017 (ref 1.005–1.030)
pH: 7 (ref 5.0–8.0)

## 2019-10-11 LAB — RAPID URINE DRUG SCREEN, HOSP PERFORMED
Amphetamines: NOT DETECTED
Barbiturates: NOT DETECTED
Benzodiazepines: NOT DETECTED
Cocaine: NOT DETECTED
Opiates: NOT DETECTED
Tetrahydrocannabinol: NOT DETECTED

## 2019-10-11 LAB — TROPONIN I (HIGH SENSITIVITY)
Troponin I (High Sensitivity): 8 ng/L (ref <18)
Troponin I (High Sensitivity): 8 ng/L (ref <18)

## 2019-10-11 MED ORDER — LACTATED RINGERS IV BOLUS
500.0000 mL | Freq: Once | INTRAVENOUS | Status: AC
  Administered 2019-10-11: 500 mL via INTRAVENOUS

## 2019-10-11 NOTE — ED Provider Notes (Signed)
[facility] EMERGENCY DEPARTMENT Provider Note   CSN: [id number] Arrival date & time: 10/10/19  2303     History Chief Complaint  Patient presents with  . Dizziness    [name] is a 74 y.o. male.   Dizziness Quality:  Imbalance and room spinning Severity:  Mild Onset quality:  Gradual Duration:  2 hours Timing:  Constant Progression:  Resolved Context: standing up   Context: not when bending over, not with head movement, not with inactivity and not with medication   Relieved by:  None tried Worsened by:  Nothing Ineffective treatments:  None tried Associated symptoms: weakness   Associated symptoms: no nausea and no vomiting        Past Medical History:  Diagnosis Date  . Apical mural thrombus   . Arteriosclerotic cardiovascular disease (ASCVD) 1998   Inferior MI->PCI of the RCA in 1998; acute anterior MI in 06/2002->  PCI of RCA and LAD with DES x2 to RCA, residual 80% ostial D1, and 80% mid CX and EF-40%; CABG-2011, LIMA-LAD, SVG to D1, OM1 & OM2; EF of 35-40% in 07/2010  . Chronic anticoagulation   . Chronic systolic CHF (congestive heart failure) ([place])   . CVA (cerebral vascular accident) ([place])   . HTN (hypertension)   . Hyperlipidemia   . Keloid    median sternotomy  . Pulmonary embolism ([place]) 03/2010  . Substance abuse [facility])    formerly cocaine    Patient Active Problem List   Diagnosis Date Noted  . Acute on chronic systolic (congestive) heart failure ([place]) 11/10/2018  . Pneumonia due to COVID-19 virus 11/10/2018  . NSTEMI (non-ST elevated myocardial infarction) ([place]) 06/21/2018  . CHF (congestive heart failure) ([place]) 09/24/2017  . Chronic systolic heart failure ([place]) 08/21/2017  . Abnormal nuclear stress test   . CHF exacerbation ([place]) 03/20/2017  . Cerebral thrombosis with cerebral infarction 03/08/2017  . TIA (transient ischemic attack) 03/08/2017  . Visual field defect of right eye 03/07/2017  . CKD (chronic kidney disease), stage III  03/07/2017  . Hyperglycemia 03/07/2017  . OSA (obstructive sleep apnea) 03/07/2017  . Special screening for malignant neoplasms, colon   . 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 Chronic anticoagulation-discontinued 10/19/2012  . Cerebral infarction ([place]) 06/09/2012  . Arteriosclerotic cardiovascular disease (ASCVD)   . Pulmonary embolism ([place]) 03/07/2010  . Hyperlipidemia 11/24/2008  . Essential hypertension 11/24/2008    Past Surgical History:  Procedure Laterality Date  . COLONOSCOPY N/A 01/22/2017   Procedure: COLONOSCOPY;  Surgeon: Danie Binder, MD;  [facility];  Service: Endoscopy;  Laterality: N/A;  200  . CORONARY ARTERY BYPASS GRAFT  03/18/2010   LIMA-LAD, SVG to diagonal, OM1 & OM2  . CORONARY STENT INTERVENTION N/A 08/21/2017   Procedure: CORONARY STENT INTERVENTION;  Surgeon: [name], MD;  Location: [place] [facility];  Service: Cardiovascular;  Laterality: N/A;  . LEFT HEART CATH AND CORS/GRAFTS ANGIOGRAPHY N/A 08/21/2017   Procedure: LEFT HEART CATH AND CORS/GRAFTS ANGIOGRAPHY;  Surgeon: [name], MD;  Location: [place] [facility];  Service: Cardiovascular;  Laterality: N/A;  . LEFT HEART CATH AND CORS/GRAFTS ANGIOGRAPHY N/A 06/21/2018   Procedure: LEFT HEART CATH AND CORS/GRAFTS ANGIOGRAPHY;  Surgeon: Nelva Bush, MD;  Location: [place] [facility];  Service: Cardiovascular;  Laterality: N/A;  . POLYPECTOMY  01/22/2017   Procedure: POLYPECTOMY;  Surgeon: Danie Binder, MD;  [facility];  Service: Endoscopy;;  Transverse(CS) and sigmoid colon(HS)  . PTCA  06/1996  LAD & RCA  . TEE WITHOUT CARDIOVERSION N/A 06/10/2012   Procedure: TRANSESOPHAGEAL ECHOCARDIOGRAM (TEE);  Surgeon: Josue Hector, MD;  [facility];  Service: Cardiovascular;  Laterality: N/A;       Family History  Problem Relation Age of Onset  . Cancer Mother 33    Social History   Tobacco Use  . Smoking status: Former Smoker    Quit date: 2004     Years since quitting: 17.5  . Smokeless tobacco: Never Used  Vaping Use  . Vaping Use: Never used  Substance Use Topics  . Alcohol use: Yes    Comment: rare  . Drug use: No    Types: Cocaine    Comment: Former cocaine abuse - last use for 51th birthday    Home Medications Prior to Admission medications   Medication Sig Start Date End Date Taking? Authorizing Provider  apixaban (ELIQUIS) 5 MG TABS tablet Take 1 tablet (5 mg total) by mouth 2 (two) times daily. 06/22/18   Minus Breeding, MD  atorvastatin (LIPITOR) 40 MG tablet Take 1 tablet (40 mg total) by mouth daily at 6 PM. 03/09/17   Rizwan, Eunice Blase, MD  BIDIL 20-37.5 MG tablet TAKE 1 TABLET BY MOUTH THREE TIMES A DAY Patient taking differently: Take 1 tablet by mouth 3 (three) times daily.  02/02/18   Herminio Commons, MD  clopidogrel (PLAVIX) 75 MG tablet Take 1 tablet (75 mg total) by mouth daily with breakfast. 06/23/18   Minus Breeding, MD  cycloSPORINE (RESTASIS) 0.05 % ophthalmic emulsion Place 1 drop into both eyes daily as needed (dry eye).     [provider]  furosemide (LASIX) 20 MG tablet Take 3 tablets (60 mg total) by mouth daily. 11/12/18   Thurnell Lose, MD  metoprolol succinate (TOPROL-XL) 50 MG 24 hr tablet Take 50 mg by mouth daily. 04/27/17   [provider]  nitroGLYCERIN (NITROSTAT) 0.4 MG SL tablet Place 1 tablet (0.4 mg total) under the tongue every 5 (five) minutes as needed for chest pain. up to 3 doses. 07/05/13   Lendon Colonel, NP  predniSONE (DELTASONE) 5 MG tablet Label  & dispense according to the schedule below. take 8 Pills PO for 3 days, 6 Pills PO for 3 days, 4 Pills PO for 3 days, 2 Pills PO for 3 days, 1 Pills PO for 3 days, 1/2 Pill  PO for 3 days then STOP. Total 65 pills. 11/12/18   Thurnell Lose, MD    Allergies    Lisinopril  Review of Systems   Review of Systems  Gastrointestinal: Negative for nausea and vomiting.  Neurological: Positive for dizziness and  weakness.  All other systems reviewed and are negative.   Physical Exam Updated Vital Signs BP (!) 167/82   Pulse 64   Temp 98.2 F (36.8 C) (Oral)   Resp 19   SpO2 96%   Physical Exam Vitals and nursing note reviewed.  Constitutional:      Appearance: He is well-developed.  HENT:     Head: Normocephalic and atraumatic.     Nose: No congestion or rhinorrhea.     Mouth/Throat:     Mouth: Mucous membranes are moist.     Pharynx: Oropharynx is clear.  Eyes:     Pupils: Pupils are equal, round, and reactive to light.  Cardiovascular:     Rate and Rhythm: Normal rate.  Pulmonary:     Effort: Pulmonary effort is normal. No respiratory distress.  Abdominal:     General: There is no distension.  Musculoskeletal:        General: Normal range of motion.     Cervical back: Normal range of motion.  Skin:    General: Skin is warm and dry.  Neurological:     General: No focal deficit present.     Mental Status: He is alert.     Comments: Mild flattening of left nasolabial fold with patient states that that is not new.  Rest of facial muscles appear to be intact.  Extraocular movements are intact.  Tongue protrudes straight.  Equal sensation to light touch throughout his face.  Equal grip strength.  Equal dorsiflexion plantarflexion.  Equal sensation to light touch of feet.     ED Results / Procedures / Treatments   Labs (all labs ordered are listed, but only abnormal results are displayed) Labs Reviewed  COMPREHENSIVE METABOLIC PANEL - Abnormal; Notable for the following components:      Result Value   Glucose, Bld 124 (*)    Creatinine, Ser 1.26 (*)    Calcium 8.4 (*)    Total Bilirubin 1.4 (*)    GFR calc non Af Amer 56 (*)    All other components within normal limits  URINALYSIS, ROUTINE W REFLEX MICROSCOPIC - Abnormal; Notable for the following components:   Protein, ur 30 (*)    All other components within normal limits  CBG MONITORING, ED - Abnormal; Notable for the  following components:   Glucose-Capillary 130 (*)    All other components within normal limits  ETHANOL  PROTIME-INR  APTT  CBC  DIFFERENTIAL  RAPID URINE DRUG SCREEN, HOSP PERFORMED  I-STAT CHEM 8, ED  TROPONIN I (HIGH SENSITIVITY)  TROPONIN I (HIGH SENSITIVITY)    EKG EKG Interpretation  Date/Time:  Monday October 10 2019 23:19:38 EDT Ventricular Rate:  66 PR Interval:    QRS Duration: 102 QT Interval:  433 QTC Calculation: 454 R Axis:   57 Text Interpretation: Sinus rhythm Multiple premature complexes, vent & supraven Probable left atrial enlargement RSR' in V1 or V2, right VCD or RVH Inferior infarct, age indeterminate Anterolateral infarct, age indeterminate No significant change since last tracing Confirmed by Merrily Pew [phone]) on 10/10/2019 11:26:00 PM   Radiology CT HEAD WO CONTRAST  Result Date: 10/11/2019 CLINICAL DATA:  Neuro deficit, stroke suspected EXAM: CT HEAD WITHOUT CONTRAST TECHNIQUE: Contiguous axial images were obtained from the base of the skull through the vertex without intravenous contrast. COMPARISON:  CT head 03/07/2017 FINDINGS: Brain: Remote infarct in the inferior right cerebellar hemisphere additional hypodense foci in the bilateral basal ganglia and left thalamus compatible with remote lacunar infarcts, similar to prior. No evidence of acute infarction, hemorrhage, hydrocephalus, extra-axial collection or mass lesion/mass effect. Symmetric prominence of the ventricles, cisterns and sulci compatible with parenchymal volume loss. Patchy areas of white matter hypoattenuation are most compatible with chronic microvascular angiopathy. Vascular: Atherosclerotic calcification of the carotid siphons and intradural vertebral arteries. No hyperdense vessel. Skull: No calvarial fracture or suspicious osseous lesion. No scalp swelling or hematoma. Sinuses/Orbits: Minimal thickening in the maxillary sinuses and ethmoids. Remaining paranasal sinuses and mastoid air cells  are predominantly clear. Included orbital structures are unremarkable. Other: None IMPRESSION: 1. No acute intracranial abnormality. If persisting clinical concern for infarct, MRI is more sensitive and specific for early changes of ischemia. 2. Remote lacunar infarcts in the bilateral basal ganglia and left thalamus and inferior right cerebellar hemisphere. 3. Stable parenchymal volume loss  and chronic microvascular angiopathy. Electronically Signed   By: Lovena Le M.D.   On: 10/11/2019 00:02    Procedures Procedures (including critical care time)  Medications Ordered in ED Medications  lactated ringers bolus 500 mL (0 mLs Intravenous Stopped 10/11/19 0519)    ED Course  I have reviewed the triage vital signs and the nursing notes.  Pertinent labs & imaging results that were available during my care of the patient were reviewed by me and considered in my medical decision making (see chart for details).    MDM Rules/Calculators/A&P                          Doubt stroke as a cause of his symptoms.  States he felt just generally weak and that maybe had some vertigo when he sat down.  His daughter states that he appeared weak and sweaty when this happened so I did a delta troponin EKG to make sure was not cardiac related which was reassuring.  He is persistently with normal neuro exam here.  Is able to ambulate without difficulty here.  Low concern for his exertional syncope.  Could just be vertigo that caused him to be real nauseous and will see any reason for admission for TIA work-up or stroke or vertigo as he is asymptomatic at this time.  Final Clinical Impression(s) / ED Diagnoses Final diagnoses:  Dizziness    Rx / DC Orders ED Discharge Orders    None       Kayton Dunaj, Corene Cornea, MD 10/11/19 [phone]

## 2019-10-11 NOTE — ED Notes (Signed)
Pt ambulated in room with no difficulty

## 2019-11-06 ENCOUNTER — Emergency Department (HOSPITAL_COMMUNITY): Payer: Medicare Other

## 2019-11-06 ENCOUNTER — Other Ambulatory Visit: Payer: Self-pay

## 2019-11-06 ENCOUNTER — Emergency Department (HOSPITAL_COMMUNITY)
Admission: EM | Admit: 2019-11-06 | Discharge: 2019-11-06 | Disposition: A | Discharge status: Home / Self Care | Payer: Medicare Other | Attending: Emergency Medicine | Admitting: Emergency Medicine

## 2019-11-06 ENCOUNTER — Encounter (HOSPITAL_COMMUNITY): Payer: Self-pay | Admitting: Emergency Medicine

## 2019-11-06 DIAGNOSIS — M5432 Sciatica, left side: Secondary | ICD-10-CM | POA: Diagnosis not present

## 2019-11-06 DIAGNOSIS — I130 Hypertensive heart and chronic kidney disease with heart failure and stage 1 through stage 4 chronic kidney disease, or unspecified chronic kidney disease: Secondary | ICD-10-CM | POA: Diagnosis not present

## 2019-11-06 DIAGNOSIS — I5023 Acute on chronic systolic (congestive) heart failure: Secondary | ICD-10-CM | POA: Insufficient documentation

## 2019-11-06 DIAGNOSIS — M5442 Lumbago with sciatica, left side: Secondary | ICD-10-CM | POA: Diagnosis not present

## 2019-11-06 DIAGNOSIS — M25552 Pain in left hip: Secondary | ICD-10-CM

## 2019-11-06 DIAGNOSIS — M79605 Pain in left leg: Secondary | ICD-10-CM | POA: Diagnosis present

## 2019-11-06 DIAGNOSIS — M47816 Spondylosis without myelopathy or radiculopathy, lumbar region: Secondary | ICD-10-CM | POA: Diagnosis not present

## 2019-11-06 DIAGNOSIS — M76892 Other specified enthesopathies of left lower limb, excluding foot: Secondary | ICD-10-CM | POA: Diagnosis not present

## 2019-11-06 DIAGNOSIS — N183 Chronic kidney disease, stage 3 unspecified: Secondary | ICD-10-CM | POA: Diagnosis not present

## 2019-11-06 DIAGNOSIS — Z79899 Other long term (current) drug therapy: Secondary | ICD-10-CM | POA: Insufficient documentation

## 2019-11-06 DIAGNOSIS — Z87891 Personal history of nicotine dependence: Secondary | ICD-10-CM | POA: Diagnosis not present

## 2019-11-06 DIAGNOSIS — M778 Other enthesopathies, not elsewhere classified: Secondary | ICD-10-CM | POA: Diagnosis not present

## 2019-11-06 DIAGNOSIS — M1612 Unilateral primary osteoarthritis, left hip: Secondary | ICD-10-CM | POA: Diagnosis not present

## 2019-11-06 MED ORDER — HYDROCODONE-ACETAMINOPHEN 5-325 MG PO TABS
1.0000 | ORAL_TABLET | Freq: Once | ORAL | Status: AC
  Administered 2019-11-06: 1 via ORAL
  Filled 2019-11-06: qty 1

## 2019-11-06 MED ORDER — HYDROCODONE-ACETAMINOPHEN 5-325 MG PO TABS: 1.0000 | ORAL_TABLET | Freq: Four times a day (QID) | ORAL | 0 refills | Status: DC | PRN

## 2019-11-06 NOTE — ED Notes (Signed)
To Rad 

## 2019-11-06 NOTE — ED Triage Notes (Signed)
Pt c/o left leg pain that began today. Pt denies falls or other injury.

## 2019-11-06 NOTE — ED Notes (Signed)
Pt wheeled to waiting room. Pt verbalized understanding of discharge instructions.   

## 2019-11-06 NOTE — ED Provider Notes (Signed)
[facility] EMERGENCY DEPARTMENT Provider Note   CSN: [id number] Arrival date & time: 11/06/19  2033     History Chief Complaint  Patient presents with  . Leg Pain    [name] is a 74 y.o. male.  HPI 74 year old male presents with left leg pain.  Started this evening after he turned around.  It is in his left lateral hip/buttocks going into his thigh.  No weakness or numbness.  No incontinence.  Pain is bad enough that it is hard to walk.  Did not take anything for the pain.  Past Medical History:  Diagnosis Date  . Apical mural thrombus   . Arteriosclerotic cardiovascular disease (ASCVD) 1998   Inferior MI->PCI of the RCA in 1998; acute anterior MI in 06/2002->  PCI of RCA and LAD with DES x2 to RCA, residual 80% ostial D1, and 80% mid CX and EF-40%; CABG-2011, LIMA-LAD, SVG to D1, OM1 & OM2; EF of 35-40% in 07/2010  . Chronic anticoagulation   . Chronic systolic CHF (congestive heart failure) ([place])   . CVA (cerebral vascular accident) ([place])   . HTN (hypertension)   . Hyperlipidemia   . Keloid    median sternotomy  . Pulmonary embolism ([place]) 03/2010  . Substance abuse [facility])    formerly cocaine    Patient Active Problem List   Diagnosis Date Noted  . Acute on chronic systolic (congestive) heart failure ([place]) 11/10/2018  . Pneumonia due to COVID-19 virus 11/10/2018  . NSTEMI (non-ST elevated myocardial infarction) ([place]) 06/21/2018  . CHF (congestive heart failure) ([place]) 09/24/2017  . Chronic systolic heart failure ([place]) 08/21/2017  . Abnormal nuclear stress test   . CHF exacerbation ([place]) 03/20/2017  . Cerebral thrombosis with cerebral infarction 03/08/2017  . TIA (transient ischemic attack) 03/08/2017  . Visual field defect of right eye 03/07/2017  . CKD (chronic kidney disease), stage III 03/07/2017  . Hyperglycemia 03/07/2017  . OSA (obstructive sleep apnea) 03/07/2017  . Special screening for malignant neoplasms, colon   . 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  Chronic anticoagulation-discontinued 10/19/2012  . Cerebral infarction ([place]) 06/09/2012  . Arteriosclerotic cardiovascular disease (ASCVD)   . Pulmonary embolism ([place]) 03/07/2010  . Hyperlipidemia 11/24/2008  . Essential hypertension 11/24/2008    Past Surgical History:  Procedure Laterality Date  . COLONOSCOPY N/A 01/22/2017   Procedure: COLONOSCOPY;  Surgeon: Danie Binder, MD;  [facility];  Service: Endoscopy;  Laterality: N/A;  200  . CORONARY ARTERY BYPASS GRAFT  03/18/2010   LIMA-LAD, SVG to diagonal, OM1 & OM2  . CORONARY STENT INTERVENTION N/A 08/21/2017   Procedure: CORONARY STENT INTERVENTION;  Surgeon: [name], MD;  Location: [place] CV LAB;  Service: Cardiovascular;  Laterality: N/A;  . LEFT HEART CATH AND CORS/GRAFTS ANGIOGRAPHY N/A 08/21/2017   Procedure: LEFT HEART CATH AND CORS/GRAFTS ANGIOGRAPHY;  Surgeon: [name], MD;  Location: [place] CV LAB;  Service: Cardiovascular;  Laterality: N/A;  . LEFT HEART CATH AND CORS/GRAFTS ANGIOGRAPHY N/A 06/21/2018   Procedure: LEFT HEART CATH AND CORS/GRAFTS ANGIOGRAPHY;  Surgeon: Nelva Bush, MD;  Location: [place] CV LAB;  Service: Cardiovascular;  Laterality: N/A;  . POLYPECTOMY  01/22/2017   Procedure: POLYPECTOMY;  Surgeon: Danie Binder, MD;  [facility];  Service: Endoscopy;;  Transverse(CS) and sigmoid colon(HS)  . PTCA  06/1996   LAD & RCA  . TEE WITHOUT CARDIOVERSION N/A 06/10/2012   Procedure: TRANSESOPHAGEAL ECHOCARDIOGRAM (TEE);  Surgeon: Josue Hector, MD;  Location[facility];  Service: Cardiovascular;  Laterality: N/A;       Family History  Problem Relation Age of Onset  . Cancer Mother 29    Social History   Tobacco Use  . Smoking status: Former Smoker    Quit date: 2004    Years since quitting: 17.5  . Smokeless tobacco: Never Used  Vaping Use  . Vaping Use: Never used  Substance Use Topics  . Alcohol use: Yes    Comment: rare  . Drug use:  No    Types: Cocaine    Comment: Former cocaine abuse - last use for 51th birthday    Home Medications Prior to Admission medications   Medication Sig Start Date End Date Taking? Authorizing Provider  apixaban (ELIQUIS) 5 MG TABS tablet Take 1 tablet (5 mg total) by mouth 2 (two) times daily. 06/22/18   Minus Breeding, MD  atorvastatin (LIPITOR) 40 MG tablet Take 1 tablet (40 mg total) by mouth daily at 6 PM. 03/09/17   Rizwan, Eunice Blase, MD  BIDIL 20-37.5 MG tablet TAKE 1 TABLET BY MOUTH THREE TIMES A DAY Patient taking differently: Take 1 tablet by mouth 3 (three) times daily.  02/02/18   Herminio Commons, MD  clopidogrel (PLAVIX) 75 MG tablet Take 1 tablet (75 mg total) by mouth daily with breakfast. 06/23/18   Minus Breeding, MD  cycloSPORINE (RESTASIS) 0.05 % ophthalmic emulsion Place 1 drop into both eyes daily as needed (dry eye).     [provider]  furosemide (LASIX) 20 MG tablet Take 3 tablets (60 mg total) by mouth daily. 11/12/18   Thurnell Lose, MD  HYDROcodone-acetaminophen (NORCO) 5-325 MG tablet Take 1 tablet by mouth every 6 (six) hours as needed for severe pain. 11/06/19   Sherwood Gambler, MD  metoprolol succinate (TOPROL-XL) 50 MG 24 hr tablet Take 50 mg by mouth daily. 04/27/17   [provider]  nitroGLYCERIN (NITROSTAT) 0.4 MG SL tablet Place 1 tablet (0.4 mg total) under the tongue every 5 (five) minutes as needed for chest pain. up to 3 doses. 07/05/13   Lendon Colonel, NP  predniSONE (DELTASONE) 5 MG tablet Label  & dispense according to the schedule below. take 8 Pills PO for 3 days, 6 Pills PO for 3 days, 4 Pills PO for 3 days, 2 Pills PO for 3 days, 1 Pills PO for 3 days, 1/2 Pill  PO for 3 days then STOP. Total 65 pills. 11/12/18   Thurnell Lose, MD    Allergies    Lisinopril  Review of Systems   Review of Systems  Constitutional: Negative for fever.  Musculoskeletal: Positive for myalgias. Negative for back pain.  Neurological: Negative  for weakness and numbness.    Physical Exam Updated Vital Signs BP (!) 151/77 (BP Location: Left Arm)   Pulse 56   Temp 98 F (36.7 C) (Oral)   Resp 18   Ht 5\' 6"  (1.676 m)   Wt (!) 102.1 kg   SpO2 96%   BMI 36.32 kg/m   Physical Exam Vitals and nursing note reviewed.  Constitutional:      General: He is not in acute distress.    Appearance: He is well-developed. He is not ill-appearing or diaphoretic.  HENT:     Head: Normocephalic and atraumatic.     Right Ear: External ear normal.     Left Ear: External ear normal.     Nose: Nose normal.  Eyes:  General:        Right eye: No discharge.        Left eye: No discharge.  Cardiovascular:     Rate and Rhythm: Normal rate and regular rhythm.     Pulses:          Dorsalis pedis pulses are 2+ on the left side.  Pulmonary:     Effort: Pulmonary effort is normal.     Breath sounds: Normal breath sounds.  Abdominal:     General: There is no distension.  Musculoskeletal:     Cervical back: Neck supple.     Thoracic back: No tenderness.     Lumbar back: No tenderness.     Left hip: No tenderness. Normal range of motion.     Left upper leg: Tenderness (mild, hard to localize) present.     Left knee: Normal range of motion. No tenderness.  Skin:    General: Skin is warm and dry.  Neurological:     Mental Status: He is alert.     Comments: 5/5 strength in BLE. Normal gross sensation  Psychiatric:        Mood and Affect: Mood is not anxious.     ED Results / Procedures / Treatments   Labs (all labs ordered are listed, but only abnormal results are displayed) Labs Reviewed - No data to display  EKG None  Radiology No results found.  Procedures Procedures (including critical care time)  Medications Ordered in ED Medications  HYDROcodone-acetaminophen (NORCO/VICODIN) 5-325 MG per tablet 1 tablet (1 tablet Oral Given 11/06/19 2227)    ED Course  I have reviewed the triage vital signs and the nursing  notes.  Pertinent labs & imaging results that were available during my care of the patient were reviewed by me and considered in my medical decision making (see chart for details).    MDM Rules/Calculators/A&P                          Patient's presentation is most consistent with a sciatica.  X-ray images personally reviewed and shows some degenerative changes in his lumbar spine.  He has no incontinence or weakness/numbness in his extremities.  I do not think he needs emergent MRI.  Will control pain with Norco and have him follow-up with his primary care physician.  We discussed return precautions. Final Clinical Impression(s) / ED Diagnoses Final diagnoses:  Left sided sciatica    Rx / DC Orders ED Discharge Orders         Ordered    HYDROcodone-acetaminophen (NORCO) 5-325 MG tablet  Every 6 hours PRN     Discontinue  Reprint     11/06/19 2318           Sherwood Gambler, MD 11/06/19 2340

## 2019-11-06 NOTE — Consult Note (Signed)
Pt reports fixing supper   Turned and had pain from L hip to lower leg  Denies back pain or problems   Here for evaluation

## 2019-11-06 NOTE — Discharge Instructions (Signed)
If you develop worsening, recurrent, or continued back pain, numbness or weakness in the legs, incontinence of your bowels or bladders, numbness of your buttocks, fever, abdominal pain, or any other new/concerning symptoms then return to the ER for evaluation.  

## 2019-11-06 NOTE — ED Notes (Signed)
Call to Rad to ask if they can inquire re length of time until Rad read

## 2019-11-06 NOTE — ED Notes (Signed)
Awaiting rad read and dispo

## 2019-11-09 ENCOUNTER — Other Ambulatory Visit (HOSPITAL_COMMUNITY): Payer: Self-pay | Admitting: Internal Medicine

## 2019-11-09 ENCOUNTER — Ambulatory Visit (HOSPITAL_COMMUNITY)
Admission: RE | Admit: 2019-11-09 | Discharge: 2019-11-09 | Disposition: A | Discharge status: Home / Self Care | Payer: Medicare Other | Source: Ambulatory Visit | Attending: Internal Medicine | Admitting: Internal Medicine

## 2019-11-09 ENCOUNTER — Other Ambulatory Visit: Payer: Self-pay

## 2019-11-09 DIAGNOSIS — M545 Low back pain, unspecified: Secondary | ICD-10-CM

## 2019-11-09 DIAGNOSIS — I700 Atherosclerosis of aorta: Secondary | ICD-10-CM | POA: Diagnosis not present

## 2019-11-09 DIAGNOSIS — M549 Dorsalgia, unspecified: Secondary | ICD-10-CM | POA: Diagnosis not present

## 2019-11-09 DIAGNOSIS — M2578 Osteophyte, vertebrae: Secondary | ICD-10-CM | POA: Diagnosis not present

## 2019-11-09 DIAGNOSIS — M25552 Pain in left hip: Secondary | ICD-10-CM | POA: Diagnosis not present

## 2019-11-09 DIAGNOSIS — M79652 Pain in left thigh: Secondary | ICD-10-CM | POA: Diagnosis not present

## 2019-11-09 DIAGNOSIS — M47816 Spondylosis without myelopathy or radiculopathy, lumbar region: Secondary | ICD-10-CM | POA: Diagnosis not present

## 2019-11-22 ENCOUNTER — Ambulatory Visit: Payer: Medicare Other | Admitting: Orthopaedic Surgery

## 2019-12-02 ENCOUNTER — Ambulatory Visit (HOSPITAL_COMMUNITY): Payer: Medicare Other | Attending: Internal Medicine

## 2019-12-02 ENCOUNTER — Encounter (HOSPITAL_COMMUNITY): Payer: Self-pay

## 2019-12-02 DIAGNOSIS — M5416 Radiculopathy, lumbar region: Secondary | ICD-10-CM | POA: Insufficient documentation

## 2019-12-02 DIAGNOSIS — M6281 Muscle weakness (generalized): Secondary | ICD-10-CM | POA: Insufficient documentation

## 2019-12-02 DIAGNOSIS — R262 Difficulty in walking, not elsewhere classified: Secondary | ICD-10-CM | POA: Insufficient documentation

## 2019-12-05 ENCOUNTER — Other Ambulatory Visit: Payer: Self-pay

## 2019-12-05 ENCOUNTER — Ambulatory Visit (HOSPITAL_COMMUNITY): Payer: Medicare Other | Admitting: Physical Therapy

## 2019-12-05 ENCOUNTER — Encounter (HOSPITAL_COMMUNITY): Payer: Self-pay | Admitting: Physical Therapy

## 2019-12-05 DIAGNOSIS — M5416 Radiculopathy, lumbar region: Secondary | ICD-10-CM | POA: Diagnosis not present

## 2019-12-05 DIAGNOSIS — M6281 Muscle weakness (generalized): Secondary | ICD-10-CM

## 2019-12-05 DIAGNOSIS — R262 Difficulty in walking, not elsewhere classified: Secondary | ICD-10-CM

## 2019-12-05 NOTE — Therapy (Signed)
[facility] [place], [place], [address] Phone: [phone]   Fax:  [phone]  Physical Therapy Evaluation  Patient Details  Name: [name] MRN: [id number] Date of Birth: 1945-07-16 Referring Provider (PT): Rosita Fire    Encounter Date: 12/05/2019   PT End of Session - 12/05/19 1225    Visit Number 1    Number of Visits 8    Date for PT Re-Evaluation 01/04/20    Authorization Type [employer] medicare    Progress Note Due on Visit 9    PT Start Time 1138    PT Stop Time 1218    PT Time Calculation (min) 40 min    Activity Tolerance Patient tolerated treatment well    Behavior During Therapy [facility] for tasks assessed/performed           Past Medical History:  Diagnosis Date  . Apical mural thrombus   . Arteriosclerotic cardiovascular disease (ASCVD) 1998   Inferior MI->PCI of the RCA in 1998; acute anterior MI in 06/2002->  PCI of RCA and LAD with DES x2 to RCA, residual 80% ostial D1, and 80% mid CX and EF-40%; CABG-2011, LIMA-LAD, SVG to D1, OM1 & OM2; EF of 35-40% in 07/2010  . Chronic anticoagulation   . Chronic systolic CHF (congestive heart failure) ([place])   . CVA (cerebral vascular accident) ([place])   . HTN (hypertension)   . Hyperlipidemia   . Keloid    median sternotomy  . Pulmonary embolism ([place]) 03/2010  . 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 COLONOSCOPY N/A 01/22/2017   Procedure: COLONOSCOPY;  Surgeon: Danie Binder, MD;  [facility];  Service: Endoscopy;  Laterality: N/A;  200  . CORONARY ARTERY BYPASS GRAFT  03/18/2010   LIMA-LAD, SVG to diagonal, OM1 & OM2  . CORONARY STENT INTERVENTION N/A 08/21/2017   Procedure: CORONARY STENT INTERVENTION;  Surgeon: [name], MD;  Location: [place] CV LAB;  Service: Cardiovascular;  Laterality: N/A;  . LEFT HEART CATH AND CORS/GRAFTS ANGIOGRAPHY N/A 08/21/2017   Procedure: LEFT  HEART CATH AND CORS/GRAFTS ANGIOGRAPHY;  Surgeon: [name], MD;  Location: [place] CV LAB;  Service: Cardiovascular;  Laterality: N/A;  . LEFT HEART CATH AND CORS/GRAFTS ANGIOGRAPHY N/A 06/21/2018   Procedure: LEFT HEART CATH AND CORS/GRAFTS ANGIOGRAPHY;  Surgeon: Nelva Bush, MD;  Location: [place] CV LAB;  Service: Cardiovascular;  Laterality: N/A;  . POLYPECTOMY  01/22/2017   Procedure: POLYPECTOMY;  Surgeon: Danie Binder, MD;  [facility];  Service: Endoscopy;;  Transverse(CS) and sigmoid colon(HS)  . PTCA  06/1996   LAD & RCA  . TEE WITHOUT CARDIOVERSION N/A 06/10/2012   Procedure: TRANSESOPHAGEAL ECHOCARDIOGRAM (TEE);  Surgeon: Josue Hector, MD;  [facility]: [facility];  Service: Cardiovascular;  Laterality: N/A;    There were no vitals filed for this visit.    Subjective Assessment - 12/05/19 1146    Subjective Pt states that he has had LT hip pain that goes down his leg to his knee for about a month now.  His pain is greater when he is putting wt on it.  He goes to Dr. Luna Glasgow tomorrow. He is taking 3 pain pills a day.    Pertinent History CVA, HTN    Limitations Standing;Walking;House hold activities    How long can you sit comfortably? ok    How long can you  stand comfortably? 5-10- minutes    How long can you walk comfortably? 5-10 minutes but leg will feel like it will give out he can sit down and then walk longer    Currently in Pain? Yes   worst is a 10 pt has gone to the ER   Pain Score 7     Pain Location Hip    Pain Orientation Left              [facility] PT Assessment - 12/05/19 0001      Assessment   Medical Diagnosis Lt hip pain possible radiculopathy    Referring Provider (PT) Tesfaye Fanta     Onset Date/Surgical Date 11/08/19    Prior Therapy none      Precautions   Precautions None      Prior Function   Level of Independence Independent      Cognition   Overall Cognitive Status Within Functional Limits for tasks  assessed      Functional Tests   Functional tests Single leg stance;Sit to Stand      Single Leg Stance   Comments LT: 10 seconds; Rt 8       Sit to Stand   Comments 11 in 30 seconds.       ROM / Strength   AROM / PROM / Strength AROM;Strength      AROM   AROM Assessment Site Lumbar    Lumbar Extension decreased 50%       Strength   Strength Assessment Site Hip    Right/Left Hip Right;Left    Right Hip Extension 4/5    Right Hip ABduction 5/5    Left Hip Extension 3/5    Left Hip ABduction 5/5      Flexibility   Soft Tissue Assessment /Muscle Length yes    Hamstrings RT 140; LT 125    Piriformis RT: 45; Lt 40       Ambulation/Gait   Ambulation Distance (Feet) 313 Feet    Assistive device None    Ambulation Surface Level    Gait Comments 2 '   Pt walks due to having open heart surgery                      Objective measurements completed on examination: See above findings.       [facility] Adult PT Treatment/Exercise - 12/05/19 0001      Exercises   Exercises Knee/Hip      Knee/Hip Exercises: Stretches   Active Hamstring Stretch Left;2 reps;20 seconds    Other Knee/Hip Stretches Lt knee to chest x 20" x 2; POE x 1'       Knee/Hip Exercises: Supine   Bridges Both;10 reps                  PT Education - 12/05/19 1224    Education Details HEp    Person(s) Educated Patient    Methods Explanation;Tactile cues;Verbal cues;Handout    Comprehension Verbalized understanding;Returned demonstration;Need further instruction            PT Short Term Goals - 12/05/19 1234      PT SHORT TERM GOAL #1   Title Pt to be I in HEP to decrease pain and improve mobility    Time 2    Period Weeks    Status New    Target Date 12/19/19      PT SHORT TERM GOAL #2   Title Pt painin Lt hip  to be 5/10 at the greatest    Time 2    Period Weeks    Status New      PT SHORT TERM GOAL #3   Title Pt to be able to walk for 15 minutes without having  increased Lt hip pain    Time 2    Period Weeks    Status New      PT SHORT TERM GOAL #4   Title PT to be able to single leg stance for 20 seconds to demonstrate improved stability    Time 2    Period Weeks             PT Long Term Goals - 12/05/19 1236      PT LONG TERM GOAL #1   Title Pt to be I in advanced HEP to decrease pain to no greater than a 2/10    Time 4    Period Weeks    Status New    Target Date 01/02/20      PT LONG TERM GOAL #2   Title PT pain to be in hip only with no radiation    Time 4    Period Weeks    Status New      PT LONG TERM GOAL #3   Title Pt to be able to walk for 30 minutes prior to increase pain in Lt hip    Time 4    Period Weeks    Status New                  Plan - 12/05/19 1227    Clinical Impression Statement Mr. Ayo is a 74 yo male who is hard of hearing.  He states that on August 3rd he was making a sandwich, turned and had pain going from his lt hip down to his knee level.  His pain occasionally goes to the foot and is worse with weight bearing.  The pain is interferring with his walking, he has had open heart surgery in the past and was a [place] but now he has to rest every 10 minutes.  He has been referred for evaluation and treatment.  Evaluation demonstrates decreased ROM, decreased flexibility, decreased strength and decreased balance.  Mr Anthes will benefit from skilled PT to address these issues and maximize his functional ability.and decrease his pain.    Personal Factors and Comorbidities Comorbidity 2;Comorbidity 3+;Fitness;Past/Current Experience    Comorbidities HOH, CVA, CAD, CHF, CKD    Examination-Activity Limitations Carry;Locomotion Level;Stand;Stairs    Examination-Participation Restrictions Community Activity;Yard Work;Shop    Stability/Clinical Decision Making Evolving/Moderate complexity    Clinical Decision Making Moderate    Rehab Potential Good    PT Frequency 2x / week    PT Duration 4 weeks      PT Treatment/Interventions ADLs/Self Care Home Management;Gait training;Stair training;Functional mobility training;Therapeutic activities;Therapeutic exercise;Balance training;Patient/family education;Manual techniques    PT Next Visit Plan assess how pt did with HEP, [place], Begin piriformis stretch, heel raises, functional squat, sit to stand, slant board stretch and manual to lumbar area.    PT Home Exercise Plan knee to chest, hamstring stretch, bridge and POE>           Patient will benefit from skilled therapeutic intervention in order to improve the following deficits and impairments:  Abnormal gait, Decreased activity tolerance, Decreased balance, Decreased range of motion, Decreased strength, Difficulty walking, Pain  Visit Diagnosis: Left lumbar radiculopathy - Plan: PT plan of care cert/re-cert  Muscle  weakness (generalized) - Plan: PT plan of care cert/re-cert  Difficulty in walking, not elsewhere classified - Plan: PT plan of care cert/re-cert     Problem List Patient Active Problem List   Diagnosis Date Noted  . Acute on chronic systolic (congestive) heart failure ([place]) 11/10/2018  . Pneumonia due to COVID-19 virus 11/10/2018  . NSTEMI (non-ST elevated myocardial infarction) ([place]) 06/21/2018  . CHF (congestive heart failure) ([place]) 09/24/2017  . Chronic systolic heart failure ([place]) 08/21/2017  . Abnormal nuclear stress test   . CHF exacerbation ([place]) 03/20/2017  . Cerebral thrombosis with cerebral infarction 03/08/2017  . TIA (transient ischemic attack) 03/08/2017  . Visual field defect of right eye 03/07/2017  . CKD (chronic kidney disease), stage III 03/07/2017  . Hyperglycemia 03/07/2017  . OSA (obstructive sleep apnea) 03/07/2017  . Special screening for malignant neoplasms, colon   . 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 Chronic anticoagulation-discontinued 10/19/2012  . Cerebral infarction ([place]) 06/09/2012  . Arteriosclerotic cardiovascular disease (ASCVD)    . Pulmonary embolism ([place]) 03/07/2010  . Hyperlipidemia 11/24/2008  . Essential hypertension 11/24/2008   Rayetta Humphrey, PT CLT [phone] 12/05/2019, 12:42 PM  [facility] [address] [place], [place], [address] Phone: [phone]   Fax:  [phone]  Name: ERMAN THUM MRN: [id number] Date of Birth: 09-18-1945

## 2019-12-06 ENCOUNTER — Encounter: Payer: Self-pay | Admitting: Orthopaedic Surgery

## 2019-12-06 ENCOUNTER — Ambulatory Visit (INDEPENDENT_AMBULATORY_CARE_PROVIDER_SITE_OTHER): Payer: Medicare Other | Admitting: Orthopaedic Surgery

## 2019-12-06 VITALS — BP 127/73 | HR 73 | Ht 66.0 in | Wt 225.0 lb

## 2019-12-06 DIAGNOSIS — M545 Low back pain, unspecified: Secondary | ICD-10-CM

## 2019-12-06 DIAGNOSIS — M79605 Pain in left leg: Secondary | ICD-10-CM

## 2019-12-06 MED ORDER — PREDNISONE 5 MG (21) PO TBPK: ORAL_TABLET | ORAL | 0 refills | Status: DC

## 2019-12-06 NOTE — Progress Notes (Signed)
Subjective:    Patient ID: [name], male    DOB: 01/24/1946, 74 y.o.   MRN: [id number]  HPI He has lower back pain and left thigh pain for several months now.  It is getting worse.  He was seen in the ER on 11-06-2019 for this.  I have reviewed the notes and x-rays.  I have independently reviewed and interpreted x-rays of this patient done at another site by another physician or qualified health professional.  He has been seen by Dr. Legrand Rams.  I have reviewed Dr. Josephine Cables notes.  The patient is on Eliquix and Plavix for severe heart problems.  He cannot take NSAIDs.  He has periods of intense pain and other times he does well.  He has been to PT and I have reviewed their notes, most recently yesterday.  He has no trauma.  He has no weakness, no bowel or bladder problems.   Review of Systems  Constitutional: Positive for activity change.  Respiratory: Positive for shortness of breath.   Cardiovascular: Positive for chest pain and palpitations.  Musculoskeletal: Positive for arthralgias, back pain and gait problem.  All other systems reviewed and are negative.  For Review of Systems, all other systems reviewed and are negative.  The following is a summary of the past history medically, past history surgically, known current medicines, social history and family history.  This information is gathered electronically by the computer from prior information and documentation.  I review this each visit and have found including this information at this point in the chart is beneficial and informative.   Past Medical History:  Diagnosis Date  . Apical mural thrombus   . Arteriosclerotic cardiovascular disease (ASCVD) 1998   Inferior MI->PCI of the RCA in 1998; acute anterior MI in 06/2002->  PCI of RCA and LAD with DES x2 to RCA, residual 80% ostial D1, and 80% mid CX and EF-40%; CABG-2011, LIMA-LAD, SVG to D1, OM1 & OM2; EF of 35-40% in 07/2010  . Chronic anticoagulation   . Chronic  systolic CHF (congestive heart failure) ([place])   . CVA (cerebral vascular accident) ([place])   . HTN (hypertension)   . Hyperlipidemia   . Keloid    median sternotomy  . Pulmonary embolism ([place]) 03/2010  . Substance abuse [facility])    formerly cocaine    Past Surgical History:  Procedure Laterality Date  . COLONOSCOPY N/A 01/22/2017   Procedure: COLONOSCOPY;  Surgeon: Danie Binder, MD;  [facility];  Service: Endoscopy;  Laterality: N/A;  200  . CORONARY ARTERY BYPASS GRAFT  03/18/2010   LIMA-LAD, SVG to diagonal, OM1 & OM2  . CORONARY STENT INTERVENTION N/A 08/21/2017   Procedure: CORONARY STENT INTERVENTION;  Surgeon: [name], MD;  Location: [place] [facility];  Service: Cardiovascular;  Laterality: N/A;  . LEFT HEART CATH AND CORS/GRAFTS ANGIOGRAPHY N/A 08/21/2017   Procedure: LEFT HEART CATH AND CORS/GRAFTS ANGIOGRAPHY;  Surgeon: [name], MD;  Location: [place] [facility];  Service: Cardiovascular;  Laterality: N/A;  . LEFT HEART CATH AND CORS/GRAFTS ANGIOGRAPHY N/A 06/21/2018   Procedure: LEFT HEART CATH AND CORS/GRAFTS ANGIOGRAPHY;  Surgeon: Nelva Bush, MD;  Location: [place] [facility];  Service: Cardiovascular;  Laterality: N/A;  . POLYPECTOMY  01/22/2017   Procedure: POLYPECTOMY;  Surgeon: Danie Binder, MD;  [facility];  Service: Endoscopy;;  Transverse(CS) and sigmoid colon(HS)  . PTCA  06/1996   LAD & RCA  . TEE WITHOUT CARDIOVERSION N/A  06/10/2012   Procedure: TRANSESOPHAGEAL ECHOCARDIOGRAM (TEE);  Surgeon: Josue Hector, MD;  [facility];  Service: Cardiovascular;  Laterality: N/A;    Current Outpatient Medications on File Prior to Visit  Medication Sig Dispense Refill  . apixaban (ELIQUIS) 5 MG TABS tablet Take 1 tablet (5 mg total) by mouth 2 (two) times daily. 60 tablet 3  . atorvastatin (LIPITOR) 40 MG tablet Take 1 tablet (40 mg total) by mouth daily at 6 PM. 30 tablet 0  . BIDIL 20-37.5 MG tablet TAKE 1 TABLET  BY MOUTH THREE TIMES A DAY (Patient taking differently: Take 1 tablet by mouth 3 (three) times daily. ) 90 tablet 6  . clopidogrel (PLAVIX) 75 MG tablet Take 1 tablet (75 mg total) by mouth daily with breakfast. 30 tablet 3  . cycloSPORINE (RESTASIS) 0.05 % ophthalmic emulsion Place 1 drop into both eyes daily as needed (dry eye).     . furosemide (LASIX) 20 MG tablet Take 3 tablets (60 mg total) by mouth daily. 90 tablet 0  . HYDROcodone-acetaminophen (NORCO) 5-325 MG tablet Take 1 tablet by mouth every 6 (six) hours as needed for severe pain. 10 tablet 0  . metoprolol succinate (TOPROL-XL) 50 MG 24 hr tablet Take 50 mg by mouth daily.  3  . nitroGLYCERIN (NITROSTAT) 0.4 MG SL tablet Place 1 tablet (0.4 mg total) under the tongue every 5 (five) minutes as needed for chest pain. up to 3 doses. 25 tablet 4  . traMADol (ULTRAM) 50 MG tablet Take 50 mg by mouth 3 (three) times daily.     No current facility-administered medications on file prior to visit.    Social History   Socioeconomic History  . Marital status: Widowed    Spouse name: Not on file  . Number of children: Not on file  . Years of education: Not on file  . Highest education level: Not on file  Occupational History  . Occupation: disability  Tobacco Use  . Smoking status: Former Smoker    Quit date: 2004    Years since quitting: 17.6  . Smokeless tobacco: Never Used  Vaping Use  . Vaping Use: Never used  Substance and Sexual Activity  . Alcohol use: Yes    Comment: rare  . Drug use: No    Types: Cocaine    Comment: Former cocaine abuse - last use for 51th birthday  . Sexual activity: Not on file  Other Topics Concern  . Not on file  Social History Narrative  . Not on file   Social Determinants of Health   Financial Resource Strain:   . Difficulty of Paying Living Expenses: Not on file  Food Insecurity:   . Worried About Charity fundraiser in the Last Year: Not on file  . Ran Out of Food in the Last Year:  Not on file  Transportation Needs:   . Lack of Transportation (Medical): Not on file  . Lack of Transportation (Non-Medical): Not on file  Physical Activity:   . Days of Exercise per Week: Not on file  . Minutes of Exercise per Session: Not on file  Stress:   . Feeling of Stress : Not on file  Social Connections:   . Frequency of Communication with Friends and Family: Not on file  . Frequency of Social Gatherings with Friends and Family: Not on file  . Attends Religious Services: Not on file  . Active Member of Clubs or Organizations: Not on file  . Attends  Club or Organization Meetings: Not on file  . Marital Status: Not on file  Intimate Partner Violence:   . Fear of Current or Ex-Partner: Not on file  . Emotionally Abused: Not on file  . Physically Abused: Not on file  . Sexually Abused: Not on file    Family History  Problem Relation Age of Onset  . Cancer Mother 45    BP 127/73   Pulse 73   Ht 5\' 6"  (1.676 m)   Wt 225 lb (102.1 kg)   BMI 36.32 kg/m   Body mass index is 36.32 kg/m.     Objective:   Physical Exam Vitals and nursing note reviewed.  Constitutional:      Appearance: He is well-developed.  HENT:     Head: Normocephalic and atraumatic.  Eyes:     Conjunctiva/sclera: Conjunctivae normal.     Pupils: Pupils are equal, round, and reactive to light.  Cardiovascular:     Rate and Rhythm: Normal rate and regular rhythm.  Pulmonary:     Effort: Pulmonary effort is normal.  Abdominal:     Palpations: Abdomen is soft.  Musculoskeletal:       Arms:     Cervical back: Normal range of motion and neck supple.       Legs:  Skin:    General: Skin is warm and dry.  Neurological:     Mental Status: He is alert and oriented to person, place, and time.     Cranial Nerves: No cranial nerve deficit.     Motor: No abnormal muscle tone.     Coordination: Coordination normal.     Deep Tendon Reflexes: Reflexes are normal and symmetric. Reflexes normal.    Psychiatric:        Behavior: Behavior normal.        Thought Content: Thought content normal.        Judgment: Judgment normal.           Assessment & Plan:   Encounter Diagnosis  Name Primary?  . Lumbar pain with radiation down left leg Yes   I will begin prednisone dose pack.  Return in one week.  He may need MRI.  Continue PT.  Call if any problem.  Precautions discussed.   Electronically Signed Sanjuana Kava, MD 8/31/202111:04 AM

## 2019-12-07 ENCOUNTER — Other Ambulatory Visit: Payer: Self-pay

## 2019-12-07 ENCOUNTER — Ambulatory Visit (HOSPITAL_COMMUNITY): Payer: Medicare Other | Attending: Internal Medicine | Admitting: Physical Therapy

## 2019-12-07 DIAGNOSIS — M6281 Muscle weakness (generalized): Secondary | ICD-10-CM | POA: Diagnosis not present

## 2019-12-07 DIAGNOSIS — M5416 Radiculopathy, lumbar region: Secondary | ICD-10-CM | POA: Insufficient documentation

## 2019-12-07 DIAGNOSIS — R262 Difficulty in walking, not elsewhere classified: Secondary | ICD-10-CM | POA: Diagnosis not present

## 2019-12-07 NOTE — Therapy (Signed)
[facility] [address] [place], [place], [address] Phone: [phone]   Fax:  [phone]  Physical Therapy Treatment  Patient Details  Name: [name] MRN: [id number] Date of Birth: 08/28/1945 Referring Provider (PT): Rosita Fire    Encounter Date: 12/07/2019   PT End of Session - 12/07/19 1133    Visit Number 2    Number of Visits 8    Date for PT Re-Evaluation 01/04/20    Authorization Type [employer] medicare    Progress Note Due on Visit 9    PT Start Time 1052    PT Stop Time 1134    PT Time Calculation (min) 42 min    Activity Tolerance Patient tolerated treatment well    Behavior During Therapy [facility] for tasks assessed/performed           Past Medical History:  Diagnosis Date  . Apical mural thrombus   . Arteriosclerotic cardiovascular disease (ASCVD) 1998   Inferior MI->PCI of the RCA in 1998; acute anterior MI in 06/2002->  PCI of RCA and LAD with DES x2 to RCA, residual 80% ostial D1, and 80% mid CX and EF-40%; CABG-2011, LIMA-LAD, SVG to D1, OM1 & OM2; EF of 35-40% in 07/2010  . Chronic anticoagulation   . Chronic systolic CHF (congestive heart failure) ([place])   . CVA (cerebral vascular accident) ([place])   . HTN (hypertension)   . Hyperlipidemia   . Keloid    median sternotomy  . Pulmonary embolism ([place]) 03/2010  . 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 COLONOSCOPY N/A 01/22/2017   Procedure: COLONOSCOPY;  Surgeon: Danie Binder, MD;  [facility];  Service: Endoscopy;  Laterality: N/A;  200  . CORONARY ARTERY BYPASS GRAFT  03/18/2010   LIMA-LAD, SVG to diagonal, OM1 & OM2  . CORONARY STENT INTERVENTION N/A 08/21/2017   Procedure: CORONARY STENT INTERVENTION;  Surgeon: [name], MD;  Location: [place] [facility];  Service: Cardiovascular;  Laterality: N/A;  . LEFT HEART CATH AND CORS/GRAFTS ANGIOGRAPHY N/A 08/21/2017   Procedure: LEFT  HEART CATH AND CORS/GRAFTS ANGIOGRAPHY;  Surgeon: [name], MD;  Location: [place] [facility];  Service: Cardiovascular;  Laterality: N/A;  . LEFT HEART CATH AND CORS/GRAFTS ANGIOGRAPHY N/A 06/21/2018   Procedure: LEFT HEART CATH AND CORS/GRAFTS ANGIOGRAPHY;  Surgeon: Nelva Bush, MD;  Location: [place] [facility];  Service: Cardiovascular;  Laterality: N/A;  . POLYPECTOMY  01/22/2017   Procedure: POLYPECTOMY;  Surgeon: Danie Binder, MD;  [facility];  Service: Endoscopy;;  Transverse(CS) and sigmoid colon(HS)  . PTCA  06/1996   LAD & RCA  . TEE WITHOUT CARDIOVERSION N/A 06/10/2012   Procedure: TRANSESOPHAGEAL ECHOCARDIOGRAM (TEE);  Surgeon: Josue Hector, MD;  [facility];  Service: Cardiovascular;  Laterality: N/A;    There were no vitals filed for this visit.   Subjective Assessment - 12/07/19 1100    Subjective pt states he went to Dr Luna Glasgow yesterday and he prescribed him a prednisone pack.  States he just started it today.  Currenlty 3/10.  STates he wakes without pain but as he gets up and move around his pain begins and is usually in his Lt LE (pointing to his quad).    Currently in Pain? Yes    Pain Score 3  [facility] Adult PT Treatment/Exercise - 12/07/19 0001      Knee/Hip Exercises: Stretches   Active Hamstring Stretch 2 reps;20 seconds;Both    Piriformis Stretch Both;2 reps;30 seconds    Piriformis Stretch Limitations seated    Other Knee/Hip Stretches bil knee to chest x 20" x 2; POE x 1'       Knee/Hip Exercises: Supine   Bridges Both;2 sets;10 reps    Straight Leg Raises Both;10 reps      Knee/Hip Exercises: Sidelying   Hip ABduction Both;10 reps      Knee/Hip Exercises: Prone   Hip Extension Both;10 reps                  PT Education - 12/07/19 1104    Education Details reviewed goals and POC moving forward.  REveiwed HEP and added seated pirifomis stretch to HEP as well.     Person(s) Educated Patient    Methods Explanation;Demonstration;Tactile cues;Verbal cues;Handout    Comprehension Verbalized understanding;Returned demonstration;Verbal cues required;Tactile cues required;Need further instruction            PT Short Term Goals - 12/07/19 1131      PT SHORT TERM GOAL #1   Title Pt to be I in HEP to decrease pain and improve mobility    Time 2    Period Weeks    Status On-going    Target Date 12/19/19      PT SHORT TERM GOAL #2   Title Pt painin Lt hip  to be 5/10 at the greatest    Time 2    Period Weeks    Status On-going      PT SHORT TERM GOAL #3   Title Pt to be able to walk for 15 minutes without having increased Lt hip pain    Time 2    Period Weeks    Status On-going      PT SHORT TERM GOAL #4   Title PT to be able to single leg stance for 20 seconds to demonstrate improved stability    Time 2    Period Weeks    Status On-going             PT Long Term Goals - 12/07/19 1132      PT LONG TERM GOAL #1   Title Pt to be I in advanced HEP to decrease pain to no greater than a 2/10    Time 4    Period Weeks    Status On-going      PT LONG TERM GOAL #2   Title PT pain to be in hip only with no radiation    Time 4    Period Weeks    Status On-going      PT LONG TERM GOAL #3   Title Pt to be able to walk for 30 minutes prior to increase pain in Lt hip    Time 4    Period Weeks    Status On-going                 Plan - 12/07/19 1320    Clinical Impression Statement Pt very hard of hearing and comprehension of instructions as far as hold /relax times of stretches.  Pt required max assistance from therapist for cues of hold, relax, repeat and count of exercises. Unable to recall his HEP or complete in correct form without assistance.  Did add seated piriformis exercise to HEP and given printout. Rt piriformis much tighter than  Lt and with equal tightness in Hamstrings.  Will need to continue to review these to ensure  compliance and carry over.  Pt did not complain of any increased pain or present with any difficulties completing therex.    Personal Factors and Comorbidities Comorbidity 2;Comorbidity 3+;Fitness;Past/Current Experience    Comorbidities HOH, CVA, CAD, CHF, CKD    Examination-Activity Limitations Carry;Locomotion Level;Stand;Stairs    Examination-Participation Restrictions Community Activity;Yard Work;Shop    Stability/Clinical Decision Making Evolving/Moderate complexity    Rehab Potential Good    PT Frequency 2x / week    PT Duration 4 weeks    PT Treatment/Interventions ADLs/Self Care Home Management;Gait training;Stair training;Functional mobility training;Therapeutic activities;Therapeutic exercise;Balance training;Patient/family education;Manual techniques    PT Next Visit Plan continue to reveiw HEP until demonstrates correctly.  Complete Foto.  Begin  heel raises, functional squat, sit to stand and slant board stretch when ready.  Add manual to lumbar area if pain does not begin to reduce.    PT Home Exercise Plan knee to chest, hamstring stretch, bridge and POE>           Patient will benefit from skilled therapeutic intervention in order to improve the following deficits and impairments:  Abnormal gait, Decreased activity tolerance, Decreased balance, Decreased range of motion, Decreased strength, Difficulty walking, Pain  Visit Diagnosis: Left lumbar radiculopathy  Muscle weakness (generalized)  Difficulty in walking, not elsewhere classified     Problem List Patient Active Problem List   Diagnosis Date Noted  . Acute on chronic systolic (congestive) heart failure ([place]) 11/10/2018  . Pneumonia due to COVID-19 virus 11/10/2018  . NSTEMI (non-ST elevated myocardial infarction) ([place]) 06/21/2018  . CHF (congestive heart failure) ([place]) 09/24/2017  . Chronic systolic heart failure ([place]) 08/21/2017  . Abnormal nuclear stress test   . CHF exacerbation ([place]) 03/20/2017  .  Cerebral thrombosis with cerebral infarction 03/08/2017  . TIA (transient ischemic attack) 03/08/2017  . Visual field defect of right eye 03/07/2017  . CKD (chronic kidney disease), stage III 03/07/2017  . Hyperglycemia 03/07/2017  . OSA (obstructive sleep apnea) 03/07/2017  . Special screening for malignant neoplasms, colon   . 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 Chronic anticoagulation-discontinued 10/19/2012  . Cerebral infarction ([place]) 06/09/2012  . Arteriosclerotic cardiovascular disease (ASCVD)   . Pulmonary embolism ([place]) 03/07/2010  . Hyperlipidemia 11/24/2008  . Essential hypertension 11/24/2008   Teena Irani, PTA/CLT [phone]  Teena Irani 12/07/2019, 1:21 PM  [facility] [address] [place], [place], [address] Phone: [phone]   Fax:  [phone]  Name: [name] MRN: [id number] Date of Birth: 03/01/1946

## 2019-12-07 NOTE — Patient Instructions (Signed)
Piriformis Stretch, Sitting    Sit, one ankle on opposite knee, same-side hand on crossed knee. Push down on knee, keeping spine straight. Lean torso forward, with flat back, until tension is felt in hamstrings and gluteals of crossed-leg side. Hold _30__ seconds. Repeat _3__ times per session. Do _2__ sessions per day.  Copyright  VHI. All rights reserved.   

## 2019-12-10 DIAGNOSIS — N183 Chronic kidney disease, stage 3 unspecified: Secondary | ICD-10-CM | POA: Diagnosis not present

## 2019-12-10 DIAGNOSIS — I10 Essential (primary) hypertension: Secondary | ICD-10-CM | POA: Diagnosis not present

## 2019-12-13 ENCOUNTER — Other Ambulatory Visit: Payer: Self-pay

## 2019-12-13 ENCOUNTER — Ambulatory Visit (INDEPENDENT_AMBULATORY_CARE_PROVIDER_SITE_OTHER): Payer: Medicare Other | Admitting: Orthopaedic Surgery

## 2019-12-13 ENCOUNTER — Encounter: Payer: Self-pay | Admitting: Orthopaedic Surgery

## 2019-12-13 VITALS — BP 162/87 | HR 84 | Wt 208.0 lb

## 2019-12-13 DIAGNOSIS — M79605 Pain in left leg: Secondary | ICD-10-CM

## 2019-12-13 DIAGNOSIS — M545 Low back pain, unspecified: Secondary | ICD-10-CM

## 2019-12-13 NOTE — Patient Instructions (Signed)
MRI scan has been ordered for you, call [phone]  Your leg pain is from your back, it may be from a pinched nerve, we will get MRI to look for this, you call today to schedule

## 2019-12-13 NOTE — Progress Notes (Signed)
Patient [name], male DOB:[birth year], 74 y.o. OJJ:[id number]  Chief Complaint  Patient presents with  . Back Pain    back pain left leg pain, better now since took dosepak, left leg still feels weak at times    HPI  [name] is a 74 y.o. male who has left sided paresthesias and left thigh numbness.  He is better after the prednisone dose pack but still has the paresthesias to the left foot.  I will get MRI.  I am concerned about HNP of the lower back.   Body mass index is 33.57 kg/m.  ROS  Review of Systems  Constitutional: Positive for activity change.  Respiratory: Positive for shortness of breath.   Cardiovascular: Positive for chest pain and palpitations.  Musculoskeletal: Positive for arthralgias, back pain and gait problem.  All other systems reviewed and are negative.   All other systems reviewed and are negative.  The following is a summary of the past history medically, past history surgically, known current medicines, social history and family history.  This information is gathered electronically by the computer from prior information and documentation.  I review this each visit and have found including this information at this point in the chart is beneficial and informative.    Past Medical History:  Diagnosis Date  . Apical mural thrombus   . Arteriosclerotic cardiovascular disease (ASCVD) 1998   Inferior MI->PCI of the RCA in 1998; acute anterior MI in 06/2002->  PCI of RCA and LAD with DES x2 to RCA, residual 80% ostial D1, and 80% mid CX and EF-40%; CABG-2011, LIMA-LAD, SVG to D1, OM1 & OM2; EF of 35-40% in 07/2010  . Chronic anticoagulation   . Chronic systolic CHF (congestive heart failure) ([place])   . CVA (cerebral vascular accident) ([place])   . HTN (hypertension)   . Hyperlipidemia   . Keloid    median sternotomy  . Pulmonary embolism ([place]) 03/2010  . Substance abuse [facility])    formerly cocaine    Past Surgical History:  Procedure  Laterality Date  . COLONOSCOPY N/A 01/22/2017   Procedure: COLONOSCOPY;  Surgeon: Danie Binder, MD;  [facility];  Service: Endoscopy;  Laterality: N/A;  200  . CORONARY ARTERY BYPASS GRAFT  03/18/2010   LIMA-LAD, SVG to diagonal, OM1 & OM2  . CORONARY STENT INTERVENTION N/A 08/21/2017   Procedure: CORONARY STENT INTERVENTION;  Surgeon: [name], MD;  Location: [place] CV LAB;  Service: Cardiovascular;  Laterality: N/A;  . LEFT HEART CATH AND CORS/GRAFTS ANGIOGRAPHY N/A 08/21/2017   Procedure: LEFT HEART CATH AND CORS/GRAFTS ANGIOGRAPHY;  Surgeon: [name], MD;  Location: [place] CV LAB;  Service: Cardiovascular;  Laterality: N/A;  . LEFT HEART CATH AND CORS/GRAFTS ANGIOGRAPHY N/A 06/21/2018   Procedure: LEFT HEART CATH AND CORS/GRAFTS ANGIOGRAPHY;  Surgeon: Nelva Bush, MD;  Location: [place] CV LAB;  Service: Cardiovascular;  Laterality: N/A;  . POLYPECTOMY  01/22/2017   Procedure: POLYPECTOMY;  Surgeon: Danie Binder, MD;  [facility];  Service: Endoscopy;;  Transverse(CS) and sigmoid colon(HS)  . PTCA  06/1996   LAD & RCA  . TEE WITHOUT CARDIOVERSION N/A 06/10/2012   Procedure: TRANSESOPHAGEAL ECHOCARDIOGRAM (TEE);  Surgeon: Josue Hector, MD;  [facility];  Service: Cardiovascular;  Laterality: N/A;    Family History  Problem Relation Age of Onset  . Cancer Mother 61    Social History Social History   Tobacco Use  . Smoking status: Former  Smoker    Quit date: 2004    Years since quitting: 17.6  . Smokeless tobacco: Never Used  Vaping Use  . Vaping Use: Never used  Substance Use Topics  . Alcohol use: Yes    Comment: rare  . 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 Lisinopril Swelling and Other (See Comments)    Mouth and tongue swells    Current Outpatient Medications  Medication Sig Dispense Refill  .  apixaban (ELIQUIS) 5 MG TABS tablet Take 1 tablet (5 mg total) by mouth 2 (two) times daily. 60 tablet 3  . atorvastatin (LIPITOR) 40 MG tablet Take 1 tablet (40 mg total) by mouth daily at 6 PM. 30 tablet 0  . BIDIL 20-37.5 MG tablet TAKE 1 TABLET BY MOUTH THREE TIMES A DAY (Patient taking differently: Take 1 tablet by mouth 3 (three) times daily. ) 90 tablet 6  . clopidogrel (PLAVIX) 75 MG tablet Take 1 tablet (75 mg total) by mouth daily with breakfast. 30 tablet 3  . cycloSPORINE (RESTASIS) 0.05 % ophthalmic emulsion Place 1 drop into both eyes daily as needed (dry eye).     . furosemide (LASIX) 20 MG tablet Take 3 tablets (60 mg total) by mouth daily. 90 tablet 0  . metoprolol succinate (TOPROL-XL) 50 MG 24 hr tablet Take 50 mg by mouth daily.  3  . nitroGLYCERIN (NITROSTAT) 0.4 MG SL tablet Place 1 tablet (0.4 mg total) under the tongue every 5 (five) minutes as needed for chest pain. up to 3 doses. 25 tablet 4  . traMADol (ULTRAM) 50 MG tablet Take 50 mg by mouth 3 (three) times daily.    Marland Kitchen HYDROcodone-acetaminophen (NORCO) 5-325 MG tablet Take 1 tablet by mouth every 6 (six) hours as needed for severe pain. (Patient not taking: Reported on 12/13/2019) 10 tablet 0  . predniSONE (STERAPRED UNI-PAK 21 TAB) 5 MG (21) TBPK tablet Take 6 pills first day; 5 pills second day; 4 pills third day; 3 pills fourth day; 2 pills next day and 1 pill last day. (Patient not taking: Reported on 12/13/2019) 21 tablet 0   No current facility-administered medications for this visit.     Physical Exam  Blood pressure (!) 162/87, pulse 84, weight 208 lb (94.3 kg).  Constitutional: overall normal hygiene, normal nutrition, well developed, normal grooming, normal body habitus. Assistive device:none  Musculoskeletal: gait and station Limp none, muscle tone and strength are normal, no tremors or atrophy is present.  .  Neurological: coordination overall normal.  Deep tendon reflex/nerve stretch intact.  Sensation  normal.  Cranial nerves II-XII intact.   Skin:   Normal overall no scars, lesions, ulcers or rashes. No psoriasis.  Psychiatric: Alert and oriented x 3.  Recent memory intact, remote memory unclear.  Normal mood and affect. Well groomed.  Good eye contact.  Cardiovascular: overall no swelling, no varicosities, no edema bilaterally, normal temperatures of the legs and arms, no clubbing, cyanosis and good capillary refill.  Spine/Pelvis examination:  Inspection:  Overall, sacoiliac joint benign and hips nontender; without crepitus or defects.   Thoracic spine inspection: Alignment normal without kyphosis present   Lumbar spine inspection:  Alignment  with normal lumbar lordosis, without scoliosis apparent.   Thoracic spine palpation:  without tenderness of spinal processes   Lumbar spine palpation: without tenderness of lumbar area; without tightness of lumbar muscles  Range of Motion:   Lumbar flexion, forward flexion is normal without pain or tenderness    Lumbar extension is full without pain or tenderness   Left lateral bend is normal without pain or tenderness   Right lateral bend is normal without pain or tenderness   Straight leg raising is normal  Strength & tone: normal   Stability overall normal stability  Lymphatic: palpation is normal.  All other systems reviewed and are negative   The patient has been educated about the nature of the problem(s) and counseled on treatment options.  The patient appeared to understand what I have discussed and is in agreement with it.  Encounter Diagnosis  Name Primary?  . Lumbar pain with radiation down left leg Yes    PLAN Call if any problems.  Precautions discussed.  Continue current medications.   Return to clinic 2 weeks   Get MRI of the lumbar spine.  Electronically Signed Sanjuana Kava, MD 9/7/20219:53 AM

## 2019-12-14 ENCOUNTER — Ambulatory Visit (HOSPITAL_COMMUNITY): Payer: Medicare Other | Admitting: Physical Therapy

## 2019-12-14 DIAGNOSIS — M6281 Muscle weakness (generalized): Secondary | ICD-10-CM

## 2019-12-14 DIAGNOSIS — R262 Difficulty in walking, not elsewhere classified: Secondary | ICD-10-CM

## 2019-12-14 DIAGNOSIS — M5416 Radiculopathy, lumbar region: Secondary | ICD-10-CM

## 2019-12-14 NOTE — Therapy (Signed)
[name], [place], [address] Phone: [phone]   Fax:  [phone]  Physical Therapy Treatment  Patient Details  Name: [name] MRN: [id number] Date of Birth: [birth year] Referring Provider (PT): Rosita Fire    Encounter Date: 12/14/2019   PT End of Session - 12/14/19 1656    Visit Number 3    Number of Visits 8    Date for PT Re-Evaluation 01/04/20    Authorization Type UHC medicare    Progress Note Due on Visit 9    PT Start Time 1621    PT Stop Time 1659    PT Time Calculation (min) 38 min    Activity Tolerance Patient tolerated treatment well    Behavior During Therapy [facility] for tasks assessed/performed           Past Medical History:  Diagnosis Date  . Apical mural thrombus   . Arteriosclerotic cardiovascular disease (ASCVD) 1998   Inferior MI->PCI of the RCA in 1998; acute anterior MI in 06/2002->  PCI of RCA and LAD with DES x2 to RCA, residual 80% ostial D1, and 80% mid CX and EF-40%; CABG-2011, LIMA-LAD, SVG to D1, OM1 & OM2; EF of 35-40% in 07/2010  . Chronic anticoagulation   . Chronic systolic CHF (congestive heart failure) ([place])   . CVA (cerebral vascular accident) ([place])   . HTN (hypertension)   . Hyperlipidemia   . Keloid    median sternotomy  . Pulmonary embolism ([place]) 03/2010  . Substance abuse [facility])    formerly cocaine    Past Surgical History:  Procedure Laterality Date  . COLONOSCOPY N/A 01/22/2017   Procedure: COLONOSCOPY;  Surgeon: Danie Binder, MD;  [facility];  Service: Endoscopy;  Laterality: N/A;  200  . CORONARY ARTERY BYPASS GRAFT  03/18/2010   LIMA-LAD, SVG to diagonal, OM1 & OM2  . CORONARY STENT INTERVENTION N/A 08/21/2017   Procedure: CORONARY STENT INTERVENTION;  Surgeon: [name], MD;  Location: [place] CV LAB;  Service: Cardiovascular;  Laterality: N/A;  . LEFT HEART CATH AND CORS/GRAFTS ANGIOGRAPHY N/A 08/21/2017   Procedure: LEFT  HEART CATH AND CORS/GRAFTS ANGIOGRAPHY;  Surgeon: [name], MD;  Location: [place] CV LAB;  Service: Cardiovascular;  Laterality: N/A;  . LEFT HEART CATH AND CORS/GRAFTS ANGIOGRAPHY N/A 06/21/2018   Procedure: LEFT HEART CATH AND CORS/GRAFTS ANGIOGRAPHY;  Surgeon: Nelva Bush, MD;  Location: [place] CV LAB;  Service: Cardiovascular;  Laterality: N/A;  . POLYPECTOMY  01/22/2017   Procedure: POLYPECTOMY;  Surgeon: Danie Binder, MD;  [facility];  Service: Endoscopy;;  Transverse(CS) and sigmoid colon(HS)  . PTCA  06/1996   LAD & RCA  . TEE WITHOUT CARDIOVERSION N/A 06/10/2012   Procedure: TRANSESOPHAGEAL ECHOCARDIOGRAM (TEE);  Surgeon: Josue Hector, MD;  [facility];  Service: Cardiovascular;  Laterality: N/A;    There were no vitals filed for this visit.   Subjective Assessment - 12/14/19 1628    Subjective pt states Dr Luna Glasgow has ordered an MRI and the 17th (?HNP per MD note) and was concerned that his BP was low.  Currently without pain.  States he's been doing his exercise with help from his grandchildren.                             [facility] Adult PT Treatment/Exercise - 12/14/19 0001      Knee/Hip Exercises:  Stretches   Piriformis Stretch Both;2 reps;30 seconds    Other Knee/Hip Stretches bil knee to chest x 20" x 2; POE x 5'       Knee/Hip Exercises: Supine   Bridges Both;2 sets;10 reps    Straight Leg Raises Both;10 reps      Knee/Hip Exercises: Sidelying   Hip ABduction Both;10 reps;2 sets      Knee/Hip Exercises: Prone   Hip Extension Both;2 sets;10 reps                    PT Short Term Goals - 12/07/19 1131      PT SHORT TERM GOAL #1   Title Pt to be I in HEP to decrease pain and improve mobility    Time 2    Period Weeks    Status On-going    Target Date 12/19/19      PT SHORT TERM GOAL #2   Title Pt painin Lt hip  to be 5/10 at the greatest    Time 2    Period Weeks    Status On-going       PT SHORT TERM GOAL #3   Title Pt to be able to walk for 15 minutes without having increased Lt hip pain    Time 2    Period Weeks    Status On-going      PT SHORT TERM GOAL #4   Title PT to be able to single leg stance for 20 seconds to demonstrate improved stability    Time 2    Period Weeks    Status On-going             PT Long Term Goals - 12/07/19 1132      PT LONG TERM GOAL #1   Title Pt to be I in advanced HEP to decrease pain to no greater than a 2/10    Time 4    Period Weeks    Status On-going      PT LONG TERM GOAL #2   Title PT pain to be in hip only with no radiation    Time 4    Period Weeks    Status On-going      PT LONG TERM GOAL #3   Title Pt to be able to walk for 30 minutes prior to increase pain in Lt hip    Time 4    Period Weeks    Status On-going                 Plan - 12/14/19 1656    Clinical Impression Statement PT extremely hard of hearing,  Continues to require verbal and tactile cues to complete all exercises correctly.  Able to add extra set/reps to some therex today.  No new exercises added as he is stil not independent with current POC. Pt with positive results of prone lying reporting it reduces his symptoms/discomfort.    Personal Factors and Comorbidities Comorbidity 2;Comorbidity 3+;Fitness;Past/Current Experience    Comorbidities HOH, CVA, CAD, CHF, CKD    Examination-Activity Limitations Carry;Locomotion Level;Stand;Stairs    Examination-Participation Restrictions Community Activity;Yard Work;Shop    Stability/Clinical Decision Making Evolving/Moderate complexity    Rehab Potential Good    PT Frequency 2x / week    PT Duration 4 weeks    PT Treatment/Interventions ADLs/Self Care Home Management;Gait training;Stair training;Functional mobility training;Therapeutic activities;Therapeutic exercise;Balance training;Patient/family education;Manual techniques    PT Next Visit Plan continue to reveiw HEP until demonstrates  correctly.  Complete  Foto next session.  Begin  heel raises, functional squat, sit to stand and slant board stretch when ready.    PT Home Exercise Plan knee to chest, hamstring stretch, bridge and POE>           Patient will benefit from skilled therapeutic intervention in order to improve the following deficits and impairments:  Abnormal gait, Decreased activity tolerance, Decreased balance, Decreased range of motion, Decreased strength, Difficulty walking, Pain  Visit Diagnosis: Muscle weakness (generalized)  Difficulty in walking, not elsewhere classified  Left lumbar radiculopathy     Problem List Patient Active Problem List   Diagnosis Date Noted  . Acute on chronic systolic (congestive) heart failure ([place]) 11/10/2018  . Pneumonia due to COVID-19 virus 11/10/2018  . NSTEMI (non-ST elevated myocardial infarction) ([place]) 06/21/2018  . CHF (congestive heart failure) ([place]) 09/24/2017  . Chronic systolic heart failure ([place]) 08/21/2017  . Abnormal nuclear stress test   . CHF exacerbation ([place]) 03/20/2017  . Cerebral thrombosis with cerebral infarction 03/08/2017  . TIA (transient ischemic attack) 03/08/2017  . Visual field defect of right eye 03/07/2017  . CKD (chronic kidney disease), stage III 03/07/2017  . Hyperglycemia 03/07/2017  . OSA (obstructive sleep apnea) 03/07/2017  . Special screening for malignant neoplasms, colon   . 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 Chronic anticoagulation-discontinued 10/19/2012  . Cerebral infarction ([place]) 06/09/2012  . Arteriosclerotic cardiovascular disease (ASCVD)   . Pulmonary embolism ([place]) 03/07/2010  . Hyperlipidemia 11/24/2008  . Essential hypertension 11/24/2008   Teena Irani, PTA/CLT [phone]  Teena Irani 12/14/2019, 5:00 PM  [place] [address] [place], [place], [address] Phone: [phone]   Fax:  [phone]  Name: [name] MRN: [id number] Date of  Birth: 1945-07-12

## 2019-12-20 ENCOUNTER — Other Ambulatory Visit: Payer: Self-pay

## 2019-12-20 ENCOUNTER — Ambulatory Visit (HOSPITAL_COMMUNITY): Payer: Medicare Other

## 2019-12-20 ENCOUNTER — Encounter (HOSPITAL_COMMUNITY): Payer: Self-pay

## 2019-12-20 DIAGNOSIS — M5416 Radiculopathy, lumbar region: Secondary | ICD-10-CM

## 2019-12-20 DIAGNOSIS — M6281 Muscle weakness (generalized): Secondary | ICD-10-CM

## 2019-12-20 DIAGNOSIS — R262 Difficulty in walking, not elsewhere classified: Secondary | ICD-10-CM

## 2019-12-20 NOTE — Therapy (Signed)
[place], [place], [address] Phone: [phone]   Fax:  [phone]  Physical Therapy Treatment  Patient Details  Name: [name] MRN: [id number] Date of Birth: 07/17/1945 Referring Provider (PT): Rosita Fire    Encounter Date: 12/20/2019   PT End of Session - 12/20/19 0933    Visit Number 4    Number of Visits 8    Date for PT Re-Evaluation 01/04/20    Authorization Type UHC medicare    Progress Note Due on Visit 9    PT Start Time 0910   FOTO read outload from 0910-0925   PT Stop Time 0957    PT Time Calculation (min) 47 min    Activity Tolerance Patient tolerated treatment well    Behavior During Therapy [facility] for tasks assessed/performed           Past Medical History:  Diagnosis Date  . Apical mural thrombus   . Arteriosclerotic cardiovascular disease (ASCVD) 1998   Inferior MI->PCI of the RCA in 1998; acute anterior MI in 06/2002->  PCI of RCA and LAD with DES x2 to RCA, residual 80% ostial D1, and 80% mid CX and EF-40%; CABG-2011, LIMA-LAD, SVG to D1, OM1 & OM2; EF of 35-40% in 07/2010  . Chronic anticoagulation   . Chronic systolic CHF (congestive heart failure) ([place])   . CVA (cerebral vascular accident) ([place])   . HTN (hypertension)   . Hyperlipidemia   . Keloid    median sternotomy  . Pulmonary embolism ([place]) 03/2010  . 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 COLONOSCOPY N/A 01/22/2017   Procedure: COLONOSCOPY;  Surgeon: Danie Binder, MD;  [facility];  Service: Endoscopy;  Laterality: N/A;  200  . CORONARY ARTERY BYPASS GRAFT  03/18/2010   LIMA-LAD, SVG to diagonal, OM1 & OM2  . CORONARY STENT INTERVENTION N/A 08/21/2017   Procedure: CORONARY STENT INTERVENTION;  Surgeon: [name], MD;  Location: [place] CV LAB;  Service: Cardiovascular;  Laterality: N/A;  . LEFT HEART CATH AND CORS/GRAFTS ANGIOGRAPHY  N/A 08/21/2017   Procedure: LEFT HEART CATH AND CORS/GRAFTS ANGIOGRAPHY;  Surgeon: [name], MD;  Location: [place] CV LAB;  Service: Cardiovascular;  Laterality: N/A;  . LEFT HEART CATH AND CORS/GRAFTS ANGIOGRAPHY N/A 06/21/2018   Procedure: LEFT HEART CATH AND CORS/GRAFTS ANGIOGRAPHY;  Surgeon: Nelva Bush, MD;  Location: [place] CV LAB;  Service: Cardiovascular;  Laterality: N/A;  . POLYPECTOMY  01/22/2017   Procedure: POLYPECTOMY;  Surgeon: Danie Binder, MD;  [facility];  Service: Endoscopy;;  Transverse(CS) and sigmoid colon(HS)  . PTCA  06/1996   LAD & RCA  . TEE WITHOUT CARDIOVERSION N/A 06/10/2012   Procedure: TRANSESOPHAGEAL ECHOCARDIOGRAM (TEE);  Surgeon: Josue Hector, MD;  [facility];  Service: Cardiovascular;  Laterality: N/A;    There were no vitals filed for this visit.   Subjective Assessment - 12/20/19 0912    Subjective Pt stated her has MRI scheduled later this week and has apt scheduled with MD the 21th.  Reports pain Lt LE, pain scale 2/10 currently.    Pertinent History CVA, HTN    Currently in Pain? Yes    Pain Score 2     Pain Location Hip    Pain Orientation Left    Pain Descriptors / Indicators Tingling    Pain Type Chronic pain  Pain Radiating Towards radicular symptoms down lateral aspect of Lt LE    Pain Onset 1 to 4 weeks ago    Pain Frequency Intermittent                             [facility] Adult PT Treatment/Exercise - 12/20/19 0001      Exercises   Exercises Knee/Hip      Knee/Hip Exercises: Stretches   Active Hamstring Stretch 3 reps;30 seconds    Piriformis Stretch Both;2 reps;30 seconds    Piriformis Stretch Limitations seated    Other Knee/Hip Stretches bil knee to chest x 20" x 2;    Other Knee/Hip Stretches POE x 2 min; pressup 5x 10"      Knee/Hip Exercises: Standing   Heel Raises 10 reps    Functional Squat 10 reps    Functional Squat Limitations front of chair, cueing  for mechanics      Knee/Hip Exercises: Seated   Sit to Sand 10 reps;without UE support   eccentric control     Knee/Hip Exercises: Supine   Bridges Both;2 sets;10 reps      Knee/Hip Exercises: Sidelying   Hip ABduction Both;10 reps      Knee/Hip Exercises: Prone   Hip Extension Both;10 reps                  PT Education - 12/20/19 0957    Education Details FOTO survery, required therapist to read outloud and explain questions    Person(s) Educated Patient    Methods Explanation    Comprehension Verbalized understanding;Returned demonstration;Verbal cues required            PT Short Term Goals - 12/07/19 1131      PT SHORT TERM GOAL #1   Title Pt to be I in HEP to decrease pain and improve mobility    Time 2    Period Weeks    Status On-going    Target Date 12/19/19      PT SHORT TERM GOAL #2   Title Pt painin Lt hip  to be 5/10 at the greatest    Time 2    Period Weeks    Status On-going      PT SHORT TERM GOAL #3   Title Pt to be able to walk for 15 minutes without having increased Lt hip pain    Time 2    Period Weeks    Status On-going      PT SHORT TERM GOAL #4   Title PT to be able to single leg stance for 20 seconds to demonstrate improved stability    Time 2    Period Weeks    Status On-going             PT Long Term Goals - 12/07/19 1132      PT LONG TERM GOAL #1   Title Pt to be I in advanced HEP to decrease pain to no greater than a 2/10    Time 4    Period Weeks    Status On-going      PT LONG TERM GOAL #2   Title PT pain to be in hip only with no radiation    Time 4    Period Weeks    Status On-going      PT LONG TERM GOAL #3   Title Pt to be able to walk for 30 minutes prior to increase pain in  Lt hip    Time 4    Period Weeks    Status On-going                 Plan - 12/20/19 5188    Clinical Impression Statement Pt extremely hard of healing.  Began session with FOTO survey, pt required therapist to read all  questions outloud (15 minutes to complete).  Pt unable to recall current home exercise program, required education on proper form and mechanics.  Did add STS, squats and heel raises for strengthening.  Positive reports with prone lying and reduction in symptoms, added press ups.    Personal Factors and Comorbidities Comorbidity 2;Comorbidity 3+;Fitness;Past/Current Experience    Comorbidities HOH, CVA, CAD, CHF, CKD    Examination-Activity Limitations Carry;Locomotion Level;Stand;Stairs    Examination-Participation Restrictions Community Activity;Yard Work;Shop    Stability/Clinical Decision Making Evolving/Moderate complexity    Clinical Decision Making Moderate    Rehab Potential Good    PT Frequency 2x / week    PT Duration 4 weeks    PT Treatment/Interventions ADLs/Self Care Home Management;Gait training;Stair training;Functional mobility training;Therapeutic activities;Therapeutic exercise;Balance training;Patient/family education;Manual techniques    PT Next Visit Plan continue to reveiw HEP until demonstrates correctly.  Continue heel raises, functional squat, sit to stand and add slant board stretch when ready.    PT Home Exercise Plan knee to chest, hamstring stretch, bridge and POE>           Patient will benefit from skilled therapeutic intervention in order to improve the following deficits and impairments:  Abnormal gait, Decreased activity tolerance, Decreased balance, Decreased range of motion, Decreased strength, Difficulty walking, Pain  Visit Diagnosis: Left lumbar radiculopathy  Difficulty in walking, not elsewhere classified  Muscle weakness (generalized)     Problem List Patient Active Problem List   Diagnosis Date Noted  . Acute on chronic systolic (congestive) heart failure ([place]) 11/10/2018  . Pneumonia due to COVID-19 virus 11/10/2018  . NSTEMI (non-ST elevated myocardial infarction) ([place]) 06/21/2018  . CHF (congestive heart failure) ([place]) 09/24/2017  .  Chronic systolic heart failure ([place]) 08/21/2017  . Abnormal nuclear stress test   . CHF exacerbation ([place]) 03/20/2017  . Cerebral thrombosis with cerebral infarction 03/08/2017  . TIA (transient ischemic attack) 03/08/2017  . Visual field defect of right eye 03/07/2017  . CKD (chronic kidney disease), stage III 03/07/2017  . Hyperglycemia 03/07/2017  . OSA (obstructive sleep apnea) 03/07/2017  . Special screening for malignant neoplasms, colon   . 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 Chronic anticoagulation-discontinued 10/19/2012  . Cerebral infarction ([place]) 06/09/2012  . Arteriosclerotic cardiovascular disease (ASCVD)   . Pulmonary embolism ([place]) 03/07/2010  . Hyperlipidemia 11/24/2008  . Essential hypertension 11/24/2008   Ihor Austin, LPTA/CLT; CBIS [phone]  Aldona Lento 12/20/2019, 10:15 AM  [place], [place], [address] Phone: [phone]   Fax:  [phone]  Name: [name] MRN: [id number] Date of Birth: 05/28/1945

## 2019-12-21 ENCOUNTER — Ambulatory Visit (HOSPITAL_COMMUNITY): Payer: Medicare Other | Admitting: Physical Therapy

## 2019-12-22 ENCOUNTER — Ambulatory Visit (HOSPITAL_COMMUNITY): Payer: Medicare Other

## 2019-12-22 ENCOUNTER — Telehealth (HOSPITAL_COMMUNITY): Payer: Self-pay

## 2019-12-22 NOTE — Telephone Encounter (Signed)
No show, called and spoke to pt who stated he was unable to make it to apt today.  Stated he tried to call but unable to get through.  Reminded next apt date and time, stated he would try to make it .   Ihor Austin, LPTA/CLT; Delana Meyer [phone]

## 2019-12-23 ENCOUNTER — Other Ambulatory Visit: Payer: Self-pay

## 2019-12-23 ENCOUNTER — Ambulatory Visit (HOSPITAL_COMMUNITY)
Admission: RE | Admit: 2019-12-23 | Discharge: 2019-12-23 | Disposition: A | Discharge status: Home / Self Care | Payer: Medicare Other | Source: Ambulatory Visit | Attending: Orthopaedic Surgery | Admitting: Orthopaedic Surgery

## 2019-12-23 DIAGNOSIS — M79605 Pain in left leg: Secondary | ICD-10-CM | POA: Insufficient documentation

## 2019-12-23 DIAGNOSIS — M545 Low back pain: Secondary | ICD-10-CM | POA: Insufficient documentation

## 2019-12-27 ENCOUNTER — Other Ambulatory Visit: Payer: Self-pay

## 2019-12-27 ENCOUNTER — Encounter: Payer: Self-pay | Admitting: Orthopaedic Surgery

## 2019-12-27 ENCOUNTER — Encounter (HOSPITAL_COMMUNITY): Payer: Medicare Other | Admitting: Physical Therapy

## 2019-12-27 ENCOUNTER — Telehealth (HOSPITAL_COMMUNITY): Payer: Self-pay | Admitting: Physical Therapy

## 2019-12-27 ENCOUNTER — Ambulatory Visit: Payer: Medicare Other | Admitting: Orthopaedic Surgery

## 2019-12-27 VITALS — BP 116/71 | HR 82 | Ht 66.0 in | Wt 208.0 lb

## 2019-12-27 DIAGNOSIS — M545 Low back pain, unspecified: Secondary | ICD-10-CM

## 2019-12-27 DIAGNOSIS — M79605 Pain in left leg: Secondary | ICD-10-CM | POA: Diagnosis not present

## 2019-12-27 NOTE — Progress Notes (Signed)
Patient [name] [name], male DOB:07-Jan-1946, 74 y.o. DTO:[id number]  Chief Complaint  Patient presents with  . Back Pain  . Results    review MRI    HPI  [name] is a 74 y.o. male who has lower back pain.  He had MRI which showed: IMPRESSION: Lumbar spondylosis as outlined with findings most notably as follows.  At L5-S1, there is a disc bulge with endplate spurring. Superimposed left center/subarticular disc protrusion at site of posterior annular fissure. Moderate facet arthrosis with mild ligamentum flavum hypertrophy. The disc protrusion contributes to moderate left subarticular stenosis, encroaching upon the descending left S1 nerve root. Correlate for left S1 radiculopathy. Mild relative narrowing of the central canal. Bilateral neural foraminal narrowing (moderate/severe right, moderate left).  At L4-L5, there is a disc bulge with superimposed broad-based right foraminal/extraforaminal disc protrusion. The disc protrusion contributes to severe right neural foraminal narrowing and likely also contacts the exiting right L4 nerve root beyond the right neural foramen. Multifactorial moderate left neural foraminal narrowing also present at this level.  Multifactorial moderate/severe left neural foraminal narrowing at L3-L4.  I have independently reviewed the MRI.     I have explained the findings to him.  I will have him see neurosurgeon for further evaluation.  He is agreeable to this.   Body mass index is 33.57 kg/m.  ROS  Review of Systems  Constitutional: Positive for activity change.  Respiratory: Positive for shortness of breath.   Cardiovascular: Positive for chest pain and palpitations.  Musculoskeletal: Positive for arthralgias, back pain and gait problem.  All other systems reviewed and are negative.   All other systems reviewed and are negative.  The following is a summary of the past history medically, past history surgically, known  current medicines, social history and family history.  This information is gathered electronically by the computer from prior information and documentation.  I review this each visit and have found including this information at this point in the chart is beneficial and informative.    Past Medical History:  Diagnosis Date  . Apical mural thrombus   . Arteriosclerotic cardiovascular disease (ASCVD) 1998   Inferior MI->PCI of the RCA in 1998; acute anterior MI in 06/2002->  PCI of RCA and LAD with DES x2 to RCA, residual 80% ostial D1, and 80% mid CX and EF-40%; CABG-2011, LIMA-LAD, SVG to D1, OM1 & OM2; EF of 35-40% in 07/2010  . Chronic anticoagulation   . Chronic systolic CHF (congestive heart failure) ([place])   . CVA (cerebral vascular accident) ([place])   . HTN (hypertension)   . Hyperlipidemia   . Keloid    median sternotomy  . Pulmonary embolism ([place]) 03/2010  . Substance abuse [facility])    formerly cocaine    Past Surgical History:  Procedure Laterality Date  . COLONOSCOPY N/A 01/22/2017   Procedure: COLONOSCOPY;  Surgeon: Danie Binder, MD;  [facility];  Service: Endoscopy;  Laterality: N/A;  200  . CORONARY ARTERY BYPASS GRAFT  03/18/2010   LIMA-LAD, SVG to diagonal, OM1 & OM2  . CORONARY STENT INTERVENTION N/A 08/21/2017   Procedure: CORONARY STENT INTERVENTION;  Surgeon: [name], MD;  Location: [place] [facility] LAB;  Service: Cardiovascular;  Laterality: N/A;  . LEFT HEART CATH AND CORS/GRAFTS ANGIOGRAPHY N/A 08/21/2017   Procedure: LEFT HEART CATH AND CORS/GRAFTS ANGIOGRAPHY;  Surgeon: [name], MD;  Location: [place] [facility];  Service: Cardiovascular;  Laterality: N/A;  . LEFT HEART CATH AND CORS/GRAFTS  ANGIOGRAPHY N/A 06/21/2018   Procedure: LEFT HEART CATH AND CORS/GRAFTS ANGIOGRAPHY;  Surgeon: Nelva Bush, MD;  Location: [place] CV LAB;  Service: Cardiovascular;  Laterality: N/A;  . POLYPECTOMY  01/22/2017   Procedure: POLYPECTOMY;  Surgeon:  Danie Binder, MD;  [facility];  Service: Endoscopy;;  Transverse(CS) and sigmoid colon(HS)  . PTCA  06/1996   LAD & RCA  . TEE WITHOUT CARDIOVERSION N/A 06/10/2012   Procedure: TRANSESOPHAGEAL ECHOCARDIOGRAM (TEE);  Surgeon: Josue Hector, MD;  [facility];  Service: Cardiovascular;  Laterality: N/A;    Family History  Problem Relation Age of Onset  . Cancer Mother 48    Social History Social History   Tobacco Use  . Smoking status: Former Smoker    Quit date: 2004    Years since quitting: 17.7  . Smokeless tobacco: Never Used  Vaping Use  . Vaping Use: Never used  Substance Use Topics  . Alcohol use: Yes    Comment: rare  . 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 Lisinopril Swelling and Other (See Comments)    Mouth and tongue swells    Current Outpatient Medications  Medication Sig Dispense Refill  . apixaban (ELIQUIS) 5 MG TABS tablet Take 1 tablet (5 mg total) by mouth 2 (two) times daily. 60 tablet 3  . atorvastatin (LIPITOR) 40 MG tablet Take 1 tablet (40 mg total) by mouth daily at 6 PM. 30 tablet 0  . BIDIL 20-37.5 MG tablet TAKE 1 TABLET BY MOUTH THREE TIMES A DAY (Patient taking differently: Take 1 tablet by mouth 3 (three) times daily. ) 90 tablet 6  . clopidogrel (PLAVIX) 75 MG tablet Take 1 tablet (75 mg total) by mouth daily with breakfast. 30 tablet 3  . cycloSPORINE (RESTASIS) 0.05 % ophthalmic emulsion Place 1 drop into both eyes daily as needed (dry eye).     . furosemide (LASIX) 20 MG tablet Take 3 tablets (60 mg total) by mouth daily. 90 tablet 0  . metoprolol succinate (TOPROL-XL) 50 MG 24 hr tablet Take 50 mg by mouth daily.  3  . nitroGLYCERIN (NITROSTAT) 0.4 MG SL tablet Place 1 tablet (0.4 mg total) under the tongue every 5 (five) minutes as needed for chest pain. up to 3 doses. 25 tablet 4  . traMADol (ULTRAM) 50 MG tablet Take 50  mg by mouth 3 (three) times daily.    Marland Kitchen HYDROcodone-acetaminophen (NORCO) 5-325 MG tablet Take 1 tablet by mouth every 6 (six) hours as needed for severe pain. (Patient not taking: Reported on 12/13/2019) 10 tablet 0   No current facility-administered medications for this visit.     Physical Exam  Blood pressure 116/71, pulse 82, height 5\' 6"  (1.676 m), weight 208 lb (94.3 kg).  Constitutional: overall normal hygiene, normal nutrition, well developed, normal grooming, normal body habitus. Assistive device:none  Musculoskeletal: gait and station Limp none, muscle tone and strength are normal, no tremors or atrophy is present.  .  Neurological: coordination overall normal.  Deep tendon reflex/nerve stretch intact.  Sensation normal.  Cranial nerves II-XII intact.   Skin:   Normal overall no scars, lesions, ulcers or rashes. No psoriasis.  Psychiatric: Alert and oriented x 3.  Recent memory intact, remote memory unclear.  Normal mood and affect. Well groomed.  Good eye contact.  Cardiovascular: overall no swelling, no  varicosities, no edema bilaterally, normal temperatures of the legs and arms, no clubbing, cyanosis and good capillary refill.  Lymphatic: palpation is normal.  Spine/Pelvis examination:  Inspection:  Overall, sacoiliac joint benign and hips nontender; without crepitus or defects.   Thoracic spine inspection: Alignment normal without kyphosis present   Lumbar spine inspection:  Alignment  with normal lumbar lordosis, without scoliosis apparent.   Thoracic spine palpation:  without tenderness of spinal processes   Lumbar spine palpation: without tenderness of lumbar area; without tightness of lumbar muscles    Range of Motion:   Lumbar flexion, forward flexion is normal without pain or tenderness    Lumbar extension is full without pain or tenderness   Left lateral bend is normal without pain or tenderness   Right lateral bend is normal without pain or  tenderness   Straight leg raising is normal  Strength & tone: normal   Stability overall normal stability All other systems reviewed and are negative   The patient has been educated about the nature of the problem(s) and counseled on treatment options.  The patient appeared to understand what I have discussed and is in agreement with it.  Encounter Diagnosis  Name Primary?  . Lumbar pain with radiation down left leg Yes    PLAN Call if any problems.  Precautions discussed.  Continue current medications.   Return to clinic to neurosurgery   Electronically Signed Sanjuana Kava, MD 9/21/20218:44 AM

## 2019-12-27 NOTE — Patient Instructions (Signed)
You have a pinched nerve in your back You will get a call from the Neurosurgery office in [place] they will see you to see if you need any surgery. The office is [facility] the phone number is [phone] and they are located on [address] in [place]

## 2019-12-27 NOTE — Telephone Encounter (Signed)
Pt did not show for appointment today, 2nd consecutive NS.  Called and left message regarding NS policy and reminder for next appt on Thursday at [place], PTA/CLT [phone]

## 2019-12-28 ENCOUNTER — Telehealth (HOSPITAL_COMMUNITY): Payer: Self-pay | Admitting: Physical Therapy

## 2019-12-28 NOTE — Telephone Encounter (Signed)
He has an appointment with his Surgeon on this date at [place] - he wants to cx this apptment and return on  Tuesday next week.

## 2019-12-29 ENCOUNTER — Encounter (HOSPITAL_COMMUNITY): Payer: Medicare Other | Admitting: Physical Therapy

## 2019-12-29 DIAGNOSIS — M5126 Other intervertebral disc displacement, lumbar region: Secondary | ICD-10-CM | POA: Diagnosis not present

## 2020-01-03 ENCOUNTER — Ambulatory Visit (HOSPITAL_COMMUNITY): Payer: Medicare Other | Admitting: Physical Therapy

## 2020-01-03 NOTE — Telephone Encounter (Signed)
Pt did not show for appointment today (NS#3).  Called and left message regarding missed appt.  Pt had 2 consecutive NS, cancelled and then NS again today.  Pt has no further appointments scheduled so will either need to be discharged or rescheduled.  Message requests for patient to call and let clinic know his intent going forward.    Teena Irani, PTA/CLT [phone]

## 2020-01-05 ENCOUNTER — Encounter (HOSPITAL_COMMUNITY): Payer: Medicare Other | Admitting: Physical Therapy

## 2020-01-06 ENCOUNTER — Ambulatory Visit: Payer: Medicare Other | Admitting: Cardiovascular Disease

## 2020-01-16 DIAGNOSIS — I669 Occlusion and stenosis of unspecified cerebral artery: Secondary | ICD-10-CM | POA: Diagnosis not present

## 2020-01-16 DIAGNOSIS — Z23 Encounter for immunization: Secondary | ICD-10-CM | POA: Diagnosis not present

## 2020-01-16 DIAGNOSIS — I5022 Chronic systolic (congestive) heart failure: Secondary | ICD-10-CM | POA: Diagnosis not present

## 2020-01-16 DIAGNOSIS — I499 Cardiac arrhythmia, unspecified: Secondary | ICD-10-CM | POA: Diagnosis not present

## 2020-01-16 DIAGNOSIS — I10 Essential (primary) hypertension: Secondary | ICD-10-CM | POA: Diagnosis not present

## 2020-01-23 DIAGNOSIS — M5126 Other intervertebral disc displacement, lumbar region: Secondary | ICD-10-CM | POA: Diagnosis not present

## 2020-02-07 ENCOUNTER — Encounter: Payer: Self-pay | Admitting: Internal Medicine

## 2020-02-16 DIAGNOSIS — N183 Chronic kidney disease, stage 3 unspecified: Secondary | ICD-10-CM | POA: Diagnosis not present

## 2020-02-16 DIAGNOSIS — I5022 Chronic systolic (congestive) heart failure: Secondary | ICD-10-CM | POA: Diagnosis not present

## 2020-02-22 ENCOUNTER — Other Ambulatory Visit: Payer: Self-pay

## 2020-02-22 ENCOUNTER — Encounter: Payer: Self-pay | Admitting: Cardiology

## 2020-02-22 ENCOUNTER — Ambulatory Visit (INDEPENDENT_AMBULATORY_CARE_PROVIDER_SITE_OTHER): Payer: Medicare Other | Admitting: Cardiology

## 2020-02-22 VITALS — BP 126/68 | HR 74 | Ht 66.0 in | Wt 219.8 lb

## 2020-02-22 DIAGNOSIS — I513 Intracardiac thrombosis, not elsewhere classified: Secondary | ICD-10-CM | POA: Insufficient documentation

## 2020-02-22 DIAGNOSIS — I255 Ischemic cardiomyopathy: Secondary | ICD-10-CM | POA: Diagnosis not present

## 2020-02-22 DIAGNOSIS — I10 Essential (primary) hypertension: Secondary | ICD-10-CM

## 2020-02-22 DIAGNOSIS — Z951 Presence of aortocoronary bypass graft: Secondary | ICD-10-CM | POA: Insufficient documentation

## 2020-02-22 DIAGNOSIS — Z8673 Personal history of transient ischemic attack (TIA), and cerebral infarction without residual deficits: Secondary | ICD-10-CM | POA: Diagnosis not present

## 2020-02-22 DIAGNOSIS — I509 Heart failure, unspecified: Secondary | ICD-10-CM

## 2020-02-22 DIAGNOSIS — Z86711 Personal history of pulmonary embolism: Secondary | ICD-10-CM | POA: Insufficient documentation

## 2020-02-22 NOTE — Assessment & Plan Note (Signed)
EF 35-40% by echo March 2020

## 2020-02-22 NOTE — Assessment & Plan Note (Signed)
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

## 2020-02-22 NOTE — Progress Notes (Signed)
Cardiology Office Note:    Date:  02/22/2020   ID:  BOBAK OGUINN, DOB Jul 17, 1945, MRN [id number]  PCP:  Rosita Fire, MD  Cardiologist:  Kate Sable, MD (Inactive)  Electrophysiologist:  None   Referring MD: Rosita Fire, MD   No chief complaint on file.   History of Present Illness:    [name] is a 74 y.o. male with a hx of CAD.  He had CABG x4 in 2011.  In May 2019 he had an intervention to the native RCA which was on grafted.  He has been on chronic anticoagulation.  He had a pulmonary embolism in 2011, a right brain stroke in March 2014, and has a documented LV thrombus.  In March 2020 he presented with an NSTEMI.  He had been off his anticoagulation for a period of time, the details surrounding that are unclear.  At catheterization he had a patent stent in the RCA and his grafts were patent.  It suspected he may have had a coronary embolism with auto lysis.  Anticoagulation was resumed.  He is in the office today for 24-month follow-up.  He is hard of hearing.  Since we saw him in April 2021 he was admitted in August 2021 with COVID pneumonia and CHF-cardiology was not consulted. He was discharged on Plavix then.   Since then he says he has been active without chest pain or shortness of breath.  He doesn't know what medications he is on and forgot to bring them.  He knows he is taking the Eliquis twice a day as directed.  He also says thinks his his primary care provider has stopped his Plavix.  His primary care provider sees him on a regular basis.  Past Medical History:  Diagnosis Date  . Apical mural thrombus   . Arteriosclerotic cardiovascular disease (ASCVD) 1998   Inferior MI->PCI of the RCA in 1998; acute anterior MI in 06/2002->  PCI of RCA and LAD with DES x2 to RCA, residual 80% ostial D1, and 80% mid CX and EF-40%; CABG-2011, LIMA-LAD, SVG to D1, OM1 & OM2; EF of 35-40% in 07/2010  . Chronic anticoagulation   . Chronic systolic CHF (congestive heart  failure) ([place])   . CVA (cerebral vascular accident) ([place])   . HTN (hypertension)   . Hyperlipidemia   . Keloid    median sternotomy  . Pulmonary embolism ([place]) 03/2010  . Substance abuse [facility])    formerly cocaine    Past Surgical History:  Procedure Laterality Date  . COLONOSCOPY N/A 01/22/2017   Procedure: COLONOSCOPY;  Surgeon: Danie Binder, MD;  [facility];  Service: Endoscopy;  Laterality: N/A;  200  . CORONARY ARTERY BYPASS GRAFT  03/18/2010   LIMA-LAD, SVG to diagonal, OM1 & OM2  . CORONARY STENT INTERVENTION N/A 08/21/2017   Procedure: CORONARY STENT INTERVENTION;  Surgeon: [name], MD;  Location: [place] CV LAB;  Service: Cardiovascular;  Laterality: N/A;  . LEFT HEART CATH AND CORS/GRAFTS ANGIOGRAPHY N/A 08/21/2017   Procedure: LEFT HEART CATH AND CORS/GRAFTS ANGIOGRAPHY;  Surgeon: [name], MD;  Location: [place] CV LAB;  Service: Cardiovascular;  Laterality: N/A;  . LEFT HEART CATH AND CORS/GRAFTS ANGIOGRAPHY N/A 06/21/2018   Procedure: LEFT HEART CATH AND CORS/GRAFTS ANGIOGRAPHY;  Surgeon: Nelva Bush, MD;  Location: [place] CV LAB;  Service: Cardiovascular;  Laterality: N/A;  . POLYPECTOMY  01/22/2017   Procedure: POLYPECTOMY;  Surgeon: Danie Binder, MD;  [facility];  Service: Endoscopy;;  Transverse(CS) and sigmoid colon(HS)  . PTCA  06/1996   LAD & RCA  . TEE WITHOUT CARDIOVERSION N/A 06/10/2012   Procedure: TRANSESOPHAGEAL ECHOCARDIOGRAM (TEE);  Surgeon: Josue Hector, MD;  [facility];  Service: Cardiovascular;  Laterality: N/A;    Current Medications: Current Meds  Medication Sig  . apixaban (ELIQUIS) 5 MG TABS tablet Take 1 tablet (5 mg total) by mouth 2 (two) times daily.  Marland Kitchen BIDIL 20-37.5 MG tablet TAKE 1 TABLET BY MOUTH THREE TIMES A DAY (Patient taking differently: Take 1 tablet by mouth 3 (three) times daily. )  . furosemide (LASIX) 20 MG tablet Take 3 tablets (60 mg total) by mouth  daily.     Allergies:   Lisinopril   Social History   Socioeconomic History  . Marital status: Widowed    Spouse name: Not on file  . Number of children: Not on file  . Years of education: Not on file  . Highest education level: Not on file  Occupational History  . Occupation: disability  Tobacco Use  . Smoking status: Former Smoker    Quit date: 2004    Years since quitting: 17.8  . Smokeless tobacco: Never Used  Vaping Use  . Vaping Use: Never used  Substance and Sexual Activity  . Alcohol use: Yes    Comment: rare  . Drug use: No    Types: Cocaine    Comment: Former cocaine abuse - last use for 51th birthday  . Sexual activity: Not on file  Other Topics Concern  . Not on file  Social History Narrative  . Not on file   Social Determinants of Health   Financial Resource Strain:   . Difficulty of Paying Living Expenses: Not on file  Food Insecurity:   . Worried About Charity fundraiser in the Last Year: Not on file  . Ran Out of Food in the Last Year: Not on file  Transportation Needs:   . Lack of Transportation (Medical): Not on file  . Lack of Transportation (Non-Medical): Not on file  Physical Activity:   . Days of Exercise per Week: Not on file  . Minutes of Exercise per Session: Not on file  Stress:   . Feeling of Stress : Not on file  Social Connections:   . Frequency of Communication with Friends and Family: Not on file  . Frequency of Social Gatherings with Friends and Family: Not on file  . Attends Religious Services: Not on file  . Active Member of Clubs or Organizations: Not on file  . Attends Archivist Meetings: Not on file  . Marital Status: Not on file     Family History: The patient's family history includes Cancer (age of onset: 49) in his mother.  ROS:   Please see the history of present illness.     All other systems reviewed and are negative.  EKGs/Labs/Other Studies Reviewed:    The following studies were reviewed  today: Cath March 2020- Conclusions: 1. Severe native coronary artery disease, including occluded proximal LAD and LCx. RCA with patent mid/distal stents and mild in-stent restenosis. 2. Widely patent LIMA-LAD, SVG-OM1-OM, and SVG-D. 3. Mildly elevated left ventricular filling pressure.  Recommendations: 1. Medical therapy; question coronary embolism with spontaneous lysis. Plan to restart heparin infusion 2 hours after TR band removal and resumption of apixaban tomorrow AM if no evidence of bleeding. 2. Continue clopidogrel 75 mg daily.  Defer continuing aspirin in the setting of  long-term anticoagulation and clopidogrel use. 3. Aggressive secondary prevention.   EKG:  EKG is not ordered today.  The ekg ordered 10/24/2019 demonstrates NSR HR 66, PVCs, inferior Qs, anterior TWI  Recent Labs: 10/10/2019: ALT 32; BUN 11; Creatinine, Ser 1.26; Hemoglobin 15.1; Platelets 155; Potassium 3.5; Sodium 139  Recent Lipid Panel    Component Value Date/Time   CHOL 79 06/22/2018 0340   TRIG 38 06/22/2018 0340   HDL 40 (L) 06/22/2018 0340   CHOLHDL 2.0 06/22/2018 0340   VLDL 8 06/22/2018 0340   LDLCALC 31 06/22/2018 0340    Physical Exam:    VS:  BP 126/68   Pulse 74   Ht 5\' 6"  (1.676 m)   Wt 219 lb 12.8 oz (99.7 kg)   SpO2 98%   BMI 35.48 kg/m     Wt Readings from Last 3 Encounters:  02/22/20 219 lb 12.8 oz (99.7 kg)  12/27/19 208 lb (94.3 kg)  12/13/19 208 lb (94.3 kg)     GEN: Well nourished, well developed AA male in no acute distress HEENT: Normal-HOH NECK: No JVD; No carotid bruits CARDIAC: RRR, no murmurs, rubs, gallops RESPIRATORY:  Clear to auscultation without rales, wheezing or rhonchi  ABDOMEN: Soft, non-tender, non-distended MUSCULOSKELETAL:  No edema; No deformity  SKIN: Warm and dry NEUROLOGIC:  Alert and oriented x 3 PSYCHIATRIC:  Normal affect   ASSESSMENT:    Hx of CABG CABG x 4 2011-LIMA-LAD, SVG-Dx, SVG-OM1-OM2 Ungrafted RCA PCI with DES May 2019-   NSTEMI March 2020- patent grafts and native RCA-? 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  I'll reach out to Dr Legrand Rams for recent medication list.  I'll also ask Dr Harl Bowie and Dr End for their input.    Medication Adjustments/Labs and Tests Ordered: Current medicines are reviewed at length with the patient today.  Concerns regarding medicines are outlined above.  No orders of the defined types were placed in this encounter.  No orders of the defined types were placed in this encounter.   Patient Instructions  Medication Instructions:  Continue all current medications.  Labwork: none  Testing/Procedures: none  Follow-Up: 6 months   Any Other Special Instructions Will Be Listed Below (If Applicable).  If you need a refill on your cardiac medications before your next appointment, please call your pharmacy.     Signed, Kerin Ransom, PA-C  02/22/2020 9:59 AM    [place]

## 2020-02-22 NOTE — Assessment & Plan Note (Signed)
No ACE or ARB- H/O angioedema

## 2020-02-22 NOTE — Assessment & Plan Note (Signed)
H/O LVT- chronic anticoagulation

## 2020-02-22 NOTE — Assessment & Plan Note (Signed)
Admitted in August with COVID and CHF

## 2020-02-22 NOTE — Patient Instructions (Signed)
Medication Instructions:  Continue all current medications.   Labwork: none  Testing/Procedures: none  Follow-Up: 6 months   Any Other Special Instructions Will Be Listed Below (If Applicable).   If you need a refill on your cardiac medications before your next appointment, please call your pharmacy.  

## 2020-02-22 NOTE — Assessment & Plan Note (Signed)
CABG x 4 2011-LIMA-LAD, SVG-Dx, SVG-OM1-OM2 Ungrafted RCA PCI with DES May 2019-  NSTEMI March 2020- patent grafts and native RCA-? Coronary embolism with lysis (he had been off anticoagulation)

## 2020-02-23 ENCOUNTER — Telehealth: Payer: Self-pay | Admitting: *Deleted

## 2020-02-23 NOTE — Telephone Encounter (Signed)
-----   Message from Erlene Quan, [place] sent at 02/23/2020 12:06 PM EST ----- Can you let Mr Falletta know that the cardiologist (Dr End) has recommended aspirin 81 mg in addition to Eliquis 5 mg BID.  Thanks  Kerin Ransom PA-C 02/23/2020 12:07 PM

## 2020-02-23 NOTE — Telephone Encounter (Signed)
Pt notified and voiced understanding 

## 2020-02-29 DIAGNOSIS — I5022 Chronic systolic (congestive) heart failure: Secondary | ICD-10-CM | POA: Diagnosis not present

## 2020-02-29 DIAGNOSIS — I2510 Atherosclerotic heart disease of native coronary artery without angina pectoris: Secondary | ICD-10-CM | POA: Diagnosis not present

## 2020-02-29 DIAGNOSIS — I10 Essential (primary) hypertension: Secondary | ICD-10-CM | POA: Diagnosis not present

## 2020-02-29 DIAGNOSIS — N1831 Chronic kidney disease, stage 3a: Secondary | ICD-10-CM | POA: Diagnosis not present

## 2020-03-30 DIAGNOSIS — N183 Chronic kidney disease, stage 3 unspecified: Secondary | ICD-10-CM | POA: Diagnosis not present

## 2020-03-30 DIAGNOSIS — I2510 Atherosclerotic heart disease of native coronary artery without angina pectoris: Secondary | ICD-10-CM | POA: Diagnosis not present

## 2020-04-26 ENCOUNTER — Other Ambulatory Visit: Payer: Medicare Other

## 2020-04-26 ENCOUNTER — Other Ambulatory Visit: Payer: Self-pay

## 2020-04-26 DIAGNOSIS — Z20822 Contact with and (suspected) exposure to covid-19: Secondary | ICD-10-CM

## 2020-04-27 LAB — SARS-COV-2, NAA 2 DAY TAT

## 2020-04-27 LAB — NOVEL CORONAVIRUS, NAA: SARS-CoV-2, NAA: NOT DETECTED

## 2020-04-30 DIAGNOSIS — I5022 Chronic systolic (congestive) heart failure: Secondary | ICD-10-CM | POA: Diagnosis not present

## 2020-04-30 DIAGNOSIS — I10 Essential (primary) hypertension: Secondary | ICD-10-CM | POA: Diagnosis not present

## 2020-05-04 ENCOUNTER — Encounter (HOSPITAL_COMMUNITY): Payer: Self-pay | Admitting: Physical Therapy

## 2020-05-04 NOTE — Therapy (Signed)
[place], [place], [address] Phone: [phone]   Fax:  [phone]  Patient Details  Name: [name] MRN: [id number] Date of Birth: June 20, 1945 Referring Provider:  No ref. provider found  Encounter Date: 05/04/2020   PHYSICAL THERAPY DISCHARGE SUMMARY  Visits from Start of Care: 3  Current functional level related to goals / functional outcomes: Unknown as pt did not return for treatment.    Remaining deficits: Unknown as pt did not return for treatment.    Education / Equipment: HEP Plan: Patient agrees to discharge.  Patient goals were not met. Patient is being discharged due to not returning since the last visit.  ?????     Rayetta Humphrey, PT CLT [phone] 05/04/2020, 1:06 PM  [facility] [address] [place], [place], [address] Phone: [phone]   Fax:  [phone]

## 2020-06-01 DIAGNOSIS — I10 Essential (primary) hypertension: Secondary | ICD-10-CM | POA: Diagnosis not present

## 2020-06-01 DIAGNOSIS — I2510 Atherosclerotic heart disease of native coronary artery without angina pectoris: Secondary | ICD-10-CM | POA: Diagnosis not present

## 2020-06-29 DIAGNOSIS — I10 Essential (primary) hypertension: Secondary | ICD-10-CM | POA: Diagnosis not present

## 2020-06-29 DIAGNOSIS — I2510 Atherosclerotic heart disease of native coronary artery without angina pectoris: Secondary | ICD-10-CM | POA: Diagnosis not present

## 2020-07-18 ENCOUNTER — Other Ambulatory Visit (HOSPITAL_COMMUNITY)
Admission: RE | Admit: 2020-07-18 | Discharge: 2020-07-18 | Disposition: A | Discharge status: Home / Self Care | Payer: Medicare Other | Source: Ambulatory Visit | Attending: Internal Medicine | Admitting: Internal Medicine

## 2020-07-18 DIAGNOSIS — N1831 Chronic kidney disease, stage 3a: Secondary | ICD-10-CM | POA: Insufficient documentation

## 2020-07-18 DIAGNOSIS — Z0001 Encounter for general adult medical examination with abnormal findings: Secondary | ICD-10-CM | POA: Insufficient documentation

## 2020-07-18 DIAGNOSIS — Z79899 Other long term (current) drug therapy: Secondary | ICD-10-CM | POA: Diagnosis not present

## 2020-07-18 DIAGNOSIS — I10 Essential (primary) hypertension: Secondary | ICD-10-CM | POA: Diagnosis not present

## 2020-07-18 LAB — CBC WITH DIFFERENTIAL/PLATELET
Abs Immature Granulocytes: 0.01 10*3/uL (ref 0.00–0.07)
Basophils Absolute: 0 10*3/uL (ref 0.0–0.1)
Basophils Relative: 0 %
Eosinophils Absolute: 0.1 10*3/uL (ref 0.0–0.5)
Eosinophils Relative: 2 %
HCT: 50.8 % (ref 39.0–52.0)
Hemoglobin: 15.6 g/dL (ref 13.0–17.0)
Immature Granulocytes: 0 %
Lymphocytes Relative: 45 %
Lymphs Abs: 2 10*3/uL (ref 0.7–4.0)
MCH: 28.6 pg (ref 26.0–34.0)
MCHC: 30.7 g/dL (ref 30.0–36.0)
MCV: 93 fL (ref 80.0–100.0)
Monocytes Absolute: 0.7 10*3/uL (ref 0.1–1.0)
Monocytes Relative: 16 %
Neutro Abs: 1.7 10*3/uL (ref 1.7–7.7)
Neutrophils Relative %: 37 %
Platelets: 155 10*3/uL (ref 150–400)
RBC: 5.46 MIL/uL (ref 4.22–5.81)
RDW: 13.3 % (ref 11.5–15.5)
WBC: 4.6 10*3/uL (ref 4.0–10.5)
nRBC: 0 % (ref 0.0–0.2)

## 2020-07-18 LAB — BASIC METABOLIC PANEL
Anion gap: 8 (ref 5–15)
BUN: 13 mg/dL (ref 8–23)
CO2: 26 mmol/L (ref 22–32)
Calcium: 8.6 mg/dL — ABNORMAL LOW (ref 8.9–10.3)
Chloride: 105 mmol/L (ref 98–111)
Creatinine, Ser: 1.35 mg/dL — ABNORMAL HIGH (ref 0.61–1.24)
GFR, Estimated: 55 mL/min — ABNORMAL LOW (ref >60)
Glucose, Bld: 103 mg/dL — ABNORMAL HIGH (ref 70–99)
Potassium: 4.1 mmol/L (ref 3.5–5.1)
Sodium: 139 mmol/L (ref 135–145)

## 2020-07-18 LAB — HEPATIC FUNCTION PANEL
ALT: 56 U/L — ABNORMAL HIGH (ref 0–44)
AST: 38 U/L (ref 15–41)
Albumin: 4 g/dL (ref 3.5–5.0)
Alkaline Phosphatase: 60 U/L (ref 38–126)
Bilirubin, Direct: 0.3 mg/dL — ABNORMAL HIGH (ref 0.0–0.2)
Indirect Bilirubin: 1.5 mg/dL — ABNORMAL HIGH (ref 0.3–0.9)
Total Bilirubin: 1.8 mg/dL — ABNORMAL HIGH (ref 0.3–1.2)
Total Protein: 7.2 g/dL (ref 6.5–8.1)

## 2020-07-18 LAB — LIPID PANEL
Cholesterol: 76 mg/dL (ref 0–200)
HDL: 36 mg/dL — ABNORMAL LOW (ref >40)
LDL Cholesterol: 23 mg/dL (ref 0–99)
Total CHOL/HDL Ratio: 2.1 RATIO
Triglycerides: 83 mg/dL (ref <150)
VLDL: 17 mg/dL (ref 0–40)

## 2020-08-01 ENCOUNTER — Encounter: Payer: Self-pay | Admitting: Internal Medicine

## 2020-08-17 DIAGNOSIS — I2510 Atherosclerotic heart disease of native coronary artery without angina pectoris: Secondary | ICD-10-CM | POA: Diagnosis not present

## 2020-08-17 DIAGNOSIS — I10 Essential (primary) hypertension: Secondary | ICD-10-CM | POA: Diagnosis not present

## 2020-08-27 ENCOUNTER — Ambulatory Visit: Payer: Medicare Other | Admitting: Internal Medicine

## 2020-09-10 ENCOUNTER — Telehealth: Payer: Self-pay | Admitting: Internal Medicine

## 2020-09-10 MED ORDER — FUROSEMIDE 20 MG PO TABS: 60.0000 mg | ORAL_TABLET | Freq: Every day | ORAL | 0 refills | Status: DC

## 2020-09-10 NOTE — Telephone Encounter (Signed)
*  STAT* If patient is at the pharmacy, call can be transferred to refill team.   1. Which medications need to be refilled? (please list name of each medication and dose if known)  furosemide (LASIX) 20 MG tablet Take 3 tablets (60 mg total) by mouth daily.    2. Which pharmacy/location (including street and city if local pharmacy) is medication to be sent to? [facility] [address]   3. Do they need a 30 day or 90 day supply?  [place]

## 2020-09-10 NOTE — Telephone Encounter (Signed)
Refilled as requested,has apt later this month with Dr.Ross

## 2020-09-12 ENCOUNTER — Telehealth: Payer: Self-pay | Admitting: *Deleted

## 2020-09-12 MED ORDER — ISOSORBIDE MONONITRATE ER 60 MG PO TB24: 60.0000 mg | ORAL_TABLET | Freq: Every day | ORAL | 3 refills | Status: DC

## 2020-09-12 MED ORDER — HYDRALAZINE HCL 25 MG PO TABS: 25.0000 mg | ORAL_TABLET | Freq: Three times a day (TID) | ORAL | 3 refills | Status: DC

## 2020-09-12 NOTE — Telephone Encounter (Signed)
Would recomm instead of bidil:  Imdur 60 mg daily and hydralazine 25 mg tid   Will need to follow BP   Dosing may need to be adjusted    Keep BP log at home   Bring in for nurses visit in 1 month to see what BP running  Bring cuff to that visit  as well as readings

## 2020-09-12 NOTE — Telephone Encounter (Signed)
Called and notified pt of medication change. Pt able to repeat changes.

## 2020-09-12 NOTE — Telephone Encounter (Signed)
Received a call from the PCP regarding alternative for Bidil. Spoke with [facility] and informed that the insurance company will no longer cover Bidil. Pharmacy requesting an alternative. Will send to DOD to advise.

## 2020-09-16 ENCOUNTER — Encounter (HOSPITAL_COMMUNITY): Payer: Self-pay

## 2020-09-16 ENCOUNTER — Emergency Department (HOSPITAL_COMMUNITY): Payer: Medicare Other

## 2020-09-16 ENCOUNTER — Emergency Department (HOSPITAL_COMMUNITY)
Admission: EM | Admit: 2020-09-16 | Discharge: 2020-09-16 | Disposition: A | Discharge status: Home / Self Care | Payer: Medicare Other | Attending: Emergency Medicine | Admitting: Emergency Medicine

## 2020-09-16 ENCOUNTER — Other Ambulatory Visit: Payer: Self-pay

## 2020-09-16 DIAGNOSIS — Z951 Presence of aortocoronary bypass graft: Secondary | ICD-10-CM | POA: Diagnosis not present

## 2020-09-16 DIAGNOSIS — I130 Hypertensive heart and chronic kidney disease with heart failure and stage 1 through stage 4 chronic kidney disease, or unspecified chronic kidney disease: Secondary | ICD-10-CM | POA: Insufficient documentation

## 2020-09-16 DIAGNOSIS — I639 Cerebral infarction, unspecified: Secondary | ICD-10-CM | POA: Diagnosis not present

## 2020-09-16 DIAGNOSIS — N183 Chronic kidney disease, stage 3 unspecified: Secondary | ICD-10-CM | POA: Insufficient documentation

## 2020-09-16 DIAGNOSIS — I2510 Atherosclerotic heart disease of native coronary artery without angina pectoris: Secondary | ICD-10-CM | POA: Diagnosis not present

## 2020-09-16 DIAGNOSIS — R112 Nausea with vomiting, unspecified: Secondary | ICD-10-CM | POA: Diagnosis not present

## 2020-09-16 DIAGNOSIS — R110 Nausea: Secondary | ICD-10-CM | POA: Diagnosis not present

## 2020-09-16 DIAGNOSIS — R109 Unspecified abdominal pain: Secondary | ICD-10-CM | POA: Diagnosis not present

## 2020-09-16 DIAGNOSIS — I5022 Chronic systolic (congestive) heart failure: Secondary | ICD-10-CM | POA: Insufficient documentation

## 2020-09-16 DIAGNOSIS — K760 Fatty (change of) liver, not elsewhere classified: Secondary | ICD-10-CM | POA: Diagnosis not present

## 2020-09-16 DIAGNOSIS — I6359 Cerebral infarction due to unspecified occlusion or stenosis of other cerebral artery: Secondary | ICD-10-CM | POA: Insufficient documentation

## 2020-09-16 DIAGNOSIS — I517 Cardiomegaly: Secondary | ICD-10-CM | POA: Diagnosis not present

## 2020-09-16 DIAGNOSIS — Z87891 Personal history of nicotine dependence: Secondary | ICD-10-CM | POA: Insufficient documentation

## 2020-09-16 DIAGNOSIS — R0602 Shortness of breath: Secondary | ICD-10-CM | POA: Diagnosis not present

## 2020-09-16 DIAGNOSIS — Z79899 Other long term (current) drug therapy: Secondary | ICD-10-CM | POA: Diagnosis not present

## 2020-09-16 DIAGNOSIS — Z7901 Long term (current) use of anticoagulants: Secondary | ICD-10-CM | POA: Diagnosis not present

## 2020-09-16 LAB — COMPREHENSIVE METABOLIC PANEL
ALT: 30 U/L (ref 0–44)
AST: 25 U/L (ref 15–41)
Albumin: 3.8 g/dL (ref 3.5–5.0)
Alkaline Phosphatase: 59 U/L (ref 38–126)
Anion gap: 7 (ref 5–15)
BUN: 13 mg/dL (ref 8–23)
CO2: 27 mmol/L (ref 22–32)
Calcium: 8.7 mg/dL — ABNORMAL LOW (ref 8.9–10.3)
Chloride: 104 mmol/L (ref 98–111)
Creatinine, Ser: 1.32 mg/dL — ABNORMAL HIGH (ref 0.61–1.24)
GFR, Estimated: 57 mL/min — ABNORMAL LOW (ref >60)
Glucose, Bld: 111 mg/dL — ABNORMAL HIGH (ref 70–99)
Potassium: 3.7 mmol/L (ref 3.5–5.1)
Sodium: 138 mmol/L (ref 135–145)
Total Bilirubin: 1.8 mg/dL — ABNORMAL HIGH (ref 0.3–1.2)
Total Protein: 7 g/dL (ref 6.5–8.1)

## 2020-09-16 LAB — LIPASE, BLOOD: Lipase: 56 U/L — ABNORMAL HIGH (ref 11–51)

## 2020-09-16 LAB — TROPONIN I (HIGH SENSITIVITY)
Troponin I (High Sensitivity): 11 ng/L (ref <18)
Troponin I (High Sensitivity): 11 ng/L (ref <18)

## 2020-09-16 LAB — CBC WITH DIFFERENTIAL/PLATELET
Abs Immature Granulocytes: 0.01 10*3/uL (ref 0.00–0.07)
Basophils Absolute: 0 10*3/uL (ref 0.0–0.1)
Basophils Relative: 1 %
Eosinophils Absolute: 0.1 10*3/uL (ref 0.0–0.5)
Eosinophils Relative: 2 %
HCT: 48.7 % (ref 39.0–52.0)
Hemoglobin: 15.8 g/dL (ref 13.0–17.0)
Immature Granulocytes: 0 %
Lymphocytes Relative: 40 %
Lymphs Abs: 2.2 10*3/uL (ref 0.7–4.0)
MCH: 30 pg (ref 26.0–34.0)
MCHC: 32.4 g/dL (ref 30.0–36.0)
MCV: 92.4 fL (ref 80.0–100.0)
Monocytes Absolute: 0.7 10*3/uL (ref 0.1–1.0)
Monocytes Relative: 12 %
Neutro Abs: 2.5 10*3/uL (ref 1.7–7.7)
Neutrophils Relative %: 45 %
Platelets: 132 10*3/uL — ABNORMAL LOW (ref 150–400)
RBC: 5.27 MIL/uL (ref 4.22–5.81)
RDW: 13.3 % (ref 11.5–15.5)
WBC: 5.4 10*3/uL (ref 4.0–10.5)
nRBC: 0 % (ref 0.0–0.2)

## 2020-09-16 LAB — URINALYSIS, ROUTINE W REFLEX MICROSCOPIC
Bacteria, UA: NONE SEEN
Bilirubin Urine: NEGATIVE
Glucose, UA: NEGATIVE mg/dL
Hgb urine dipstick: NEGATIVE
Ketones, ur: NEGATIVE mg/dL
Leukocytes,Ua: NEGATIVE
Nitrite: NEGATIVE
Protein, ur: 30 mg/dL — AB
Specific Gravity, Urine: >1.046 — ABNORMAL HIGH (ref 1.005–1.030)
pH: 6 (ref 5.0–8.0)

## 2020-09-16 LAB — BRAIN NATRIURETIC PEPTIDE: B Natriuretic Peptide: 208 pg/mL — ABNORMAL HIGH (ref 0.0–100.0)

## 2020-09-16 MED ORDER — CEPHALEXIN 500 MG PO CAPS: 500.0000 mg | ORAL_CAPSULE | Freq: Two times a day (BID) | ORAL | 0 refills | Status: DC

## 2020-09-16 MED ORDER — CEPHALEXIN 500 MG PO CAPS
500.0000 mg | ORAL_CAPSULE | Freq: Once | ORAL | Status: AC
  Administered 2020-09-16: 500 mg via ORAL
  Filled 2020-09-16: qty 1

## 2020-09-16 MED ORDER — IOHEXOL 300 MG/ML  SOLN
100.0000 mL | Freq: Once | INTRAMUSCULAR | Status: AC | PRN
  Administered 2020-09-16: 100 mL via INTRAVENOUS

## 2020-09-16 NOTE — ED Provider Notes (Signed)
[facility] Provider Note   CSN: [id number] Arrival date & time: 09/16/20  0038     History Chief Complaint  Patient presents with   Nausea    Resolved     [name] is a 75 y.o. male.  Level 5 caveat for hearing difficulty.  Patient is a poor historian.  States he is here because he is "sick to my stomach".  Symptoms began last night and became worse this evening.  He denies any abdominal pain but says he just feels sick to his stomach but better since he arrived to the hospital.  He told the nurse he was having nausea but denies this to me.  Denies sensation of needing to vomit.  Denies any vomiting.  Denies any diarrhea.  Denies any abdominal pain.  Denies any chest pain or shortness of breath.  Denies any dizziness or lightheadedness.  No focal weakness, numbness or tingling.  No difficulty speaking or difficulty swallowing. He is having difficulty describing his symptoms only can tell me is that he feels sick to his stomach but the sensation is improving. Past medical history includes CAD with stents, chronic anticoagulation on Eliquis, CHF, hypertension, previous CVA.  The history is provided by the patient.      Past Medical History:  Diagnosis Date   Apical mural thrombus    Arteriosclerotic cardiovascular disease (ASCVD) 1998   Inferior MI->PCI of the RCA in 1998; acute anterior MI in 06/2002->  PCI of RCA and LAD with DES x2 to RCA, residual 80% ostial D1, and 80% mid CX and EF-40%; CABG-2011, LIMA-LAD, SVG to D1, OM1 & OM2; EF of 35-40% in 07/2010   Chronic anticoagulation    Chronic systolic CHF (congestive heart failure) (HCC)    CVA (cerebral vascular accident) ([place])    HTN (hypertension)    Hyperlipidemia    Keloid    median sternotomy   Pulmonary embolism ([place]) 03/2010   Substance abuse ([place])    formerly cocaine    Patient Active Problem List   Diagnosis Date Noted   Hx of CABG 02/22/2020   Ischemic cardiomyopathy 02/22/2020   LV  (left ventricular) mural thrombus 02/22/2020   History of CVA (cerebrovascular accident) 02/22/2020   History of pulmonary embolism 02/22/2020   Acute on chronic systolic (congestive) heart failure ([place]) 11/10/2018   Pneumonia due to COVID-19 virus 11/10/2018   NSTEMI (non-ST elevated myocardial infarction) ([place]) 06/21/2018   CHF (congestive heart failure) ([place]) 33/29/5188   Chronic systolic heart failure ([place]) 08/21/2017   Abnormal nuclear stress test    CHF exacerbation ([place]) 03/20/2017   Cerebral thrombosis with cerebral infarction 03/08/2017   TIA (transient ischemic attack) 03/08/2017   Visual field defect of right eye 03/07/2017   CKD (chronic kidney disease), stage III ([place]) 03/07/2017   Hyperglycemia 03/07/2017   OSA (obstructive sleep apnea) 03/07/2017   Special screening for malignant neoplasms, colon    Central sleep apnea 11/01/2012   Chronic anticoagulation-discontinued 10/19/2012   Cerebral infarction ([place]) 06/09/2012   Arteriosclerotic cardiovascular disease (ASCVD)    Pulmonary embolism ([place]) 03/07/2010   Hyperlipidemia 11/24/2008   Essential hypertension 11/24/2008    Past Surgical History:  Procedure Laterality Date   COLONOSCOPY N/A 01/22/2017   Procedure: COLONOSCOPY;  Surgeon: Danie Binder, MD;  [facility];  Service: Endoscopy;  Laterality: N/A;  200   CORONARY ARTERY BYPASS GRAFT  03/18/2010   LIMA-LAD, SVG to diagonal, OM1 & OM2   CORONARY STENT INTERVENTION  N/A 08/21/2017   Procedure: CORONARY STENT INTERVENTION;  Surgeon: [name], MD;  Location: [facility];  Service: Cardiovascular;  Laterality: N/A;   LEFT HEART CATH AND CORS/GRAFTS ANGIOGRAPHY N/A 08/21/2017   Procedure: LEFT HEART CATH AND CORS/GRAFTS ANGIOGRAPHY;  Surgeon: [name], MD;  Location: [facility];  Service: Cardiovascular;  Laterality: N/A;   LEFT HEART CATH AND CORS/GRAFTS ANGIOGRAPHY N/A 06/21/2018   Procedure: LEFT HEART CATH AND CORS/GRAFTS  ANGIOGRAPHY;  Surgeon: Nelva Bush, MD;  Location: [facility];  Service: Cardiovascular;  Laterality: N/A;   POLYPECTOMY  01/22/2017   Procedure: POLYPECTOMY;  Surgeon: Danie Binder, MD;  [facility];  Service: Endoscopy;;  Transverse(CS) and sigmoid colon(HS)   PTCA  06/1996   LAD & RCA   TEE WITHOUT CARDIOVERSION N/A 06/10/2012   Procedure: TRANSESOPHAGEAL ECHOCARDIOGRAM (TEE);  Surgeon: Josue Hector, MD;  [facility];  Service: Cardiovascular;  Laterality: N/A;       Family History  Problem Relation Age of Onset   Cancer Mother 92    Social History   Tobacco Use   Smoking status: Former    Pack years: 0.00    Types: Cigarettes    Quit date: 2004    Years since quitting: 18.4   Smokeless tobacco: Never  Vaping Use   Vaping Use: Never used  Substance Use Topics   Alcohol use: Yes    Comment: rare   Drug use: No    Types: Cocaine    Comment: Former cocaine abuse - last use for 51th birthday    Home Medications Prior to Admission medications   Medication Sig Start Date End Date Taking? Authorizing Provider  apixaban (ELIQUIS) 5 MG TABS tablet Take 1 tablet (5 mg total) by mouth 2 (two) times daily. 06/22/18   Minus Breeding, MD  atorvastatin (LIPITOR) 40 MG tablet Take 1 tablet (40 mg total) by mouth daily at 6 PM. 03/09/17   Debbe Odea, MD  cycloSPORINE (RESTASIS) 0.05 % ophthalmic emulsion Place 1 drop into both eyes daily as needed (dry eye).     [provider]  furosemide (LASIX) 20 MG tablet Take 3 tablets (60 mg total) by mouth daily. 09/10/20   Fay Records, MD  hydrALAZINE (APRESOLINE) 25 MG tablet Take 1 tablet (25 mg total) by mouth 3 (three) times daily. 09/12/20 12/11/20  Fay Records, MD  HYDROcodone-acetaminophen (NORCO) 5-325 MG tablet Take 1 tablet by mouth every 6 (six) hours as needed for severe pain. Patient not taking: Reported on 12/13/2019 11/06/19   Sherwood Gambler, MD  isosorbide mononitrate (IMDUR) 60 MG  24 hr tablet Take 1 tablet (60 mg total) by mouth daily. 09/12/20 12/11/20  Fay Records, MD  metoprolol succinate (TOPROL-XL) 50 MG 24 hr tablet Take 50 mg by mouth daily. 04/27/17   [provider]  nitroGLYCERIN (NITROSTAT) 0.4 MG SL tablet Place 1 tablet (0.4 mg total) under the tongue every 5 (five) minutes as needed for chest pain. up to 3 doses. 07/05/13   Lendon Colonel, NP  traMADol (ULTRAM) 50 MG tablet Take 50 mg by mouth 3 (three) times daily. 11/29/19   [provider]    Allergies    Lisinopril  Review of Systems   Review of Systems  Constitutional:  Positive for appetite change. Negative for activity change and fever.  HENT:  Negative for congestion and rhinorrhea.   Respiratory:  Negative for cough and shortness of breath.  Cardiovascular:  Negative for chest pain.  Gastrointestinal:  Positive for nausea. Negative for abdominal pain and vomiting.  Genitourinary:  Negative for dysuria and hematuria.  Musculoskeletal:  Negative for arthralgias and myalgias.  Skin:  Negative for rash.  Neurological:  Negative for dizziness, weakness and headaches.   all other systems are negative except as noted in the HPI and PMH.   Physical Exam Updated Vital Signs BP (!) 157/73   Pulse (!) 59   Temp 98.1 F (36.7 C)   Resp 19   Ht 5\' 6"  (1.676 m)   Wt 93 kg   SpO2 95%   BMI 33.09 kg/m   Physical Exam Vitals and nursing note reviewed.  Constitutional:      General: He is not in acute distress.    Appearance: He is well-developed. He is not ill-appearing.  HENT:     Head: Normocephalic and atraumatic.     Mouth/Throat:     Pharynx: No oropharyngeal exudate.  Eyes:     Conjunctiva/sclera: Conjunctivae normal.     Pupils: Pupils are equal, round, and reactive to light.  Neck:     Comments: No meningismus. Cardiovascular:     Rate and Rhythm: Normal rate and regular rhythm.     Heart sounds: Normal heart sounds. No murmur heard. Pulmonary:     Effort:  Pulmonary effort is normal. No respiratory distress.     Breath sounds: Normal breath sounds.  Abdominal:     Palpations: Abdomen is soft.     Tenderness: There is no abdominal tenderness. There is no guarding or rebound.  Musculoskeletal:        General: No tenderness. Normal range of motion.     Cervical back: Normal range of motion and neck supple.  Skin:    General: Skin is warm.  Neurological:     Mental Status: He is alert and oriented to person, place, and time.     Cranial Nerves: No cranial nerve deficit.     Motor: No abnormal muscle tone.     Coordination: Coordination normal.     Comments: No ataxia on finger to nose bilaterally. No pronator drift. 5/5 strength throughout. CN 2-12 intact.Equal grip strength. Sensation intact.   Psychiatric:        Behavior: Behavior normal.    ED Results / Procedures / Treatments   Labs (all labs ordered are listed, but only abnormal results are displayed) Labs Reviewed  CBC WITH DIFFERENTIAL/PLATELET - Abnormal; Notable for the following components:      Result Value   Platelets 132 (*)    All other components within normal limits  COMPREHENSIVE METABOLIC PANEL - Abnormal; Notable for the following components:   Glucose, Bld 111 (*)    Creatinine, Ser 1.32 (*)    Calcium 8.7 (*)    Total Bilirubin 1.8 (*)    GFR, Estimated 57 (*)    All other components within normal limits  LIPASE, BLOOD - Abnormal; Notable for the following components:   Lipase 56 (*)    All other components within normal limits  BRAIN NATRIURETIC PEPTIDE - Abnormal; Notable for the following components:   B Natriuretic Peptide 208.0 (*)    All other components within normal limits  URINALYSIS, ROUTINE W REFLEX MICROSCOPIC  TROPONIN I (HIGH SENSITIVITY)  TROPONIN I (HIGH SENSITIVITY)    EKG EKG Interpretation  Date/Time:  Sunday September 16 2020 01:10:38 EDT Ventricular Rate:  69 PR Interval:  115 QRS Duration: 139 QT Interval:  448  QTC  Calculation: 417 R Axis:   82 Text Interpretation: Sinus rhythm Supraventricular bigeminy Borderline short PR interval Right bundle branch block Inferior infarct, age indeterminate Anterolateral infarct, age indeterminate No significant change was found Confirmed by Ezequiel Essex [id number]) on 09/16/2020 1:53:13 AM  Radiology CT ABDOMEN PELVIS W CONTRAST  Result Date: 09/16/2020 CLINICAL DATA:  Nausea, since resolved denies current abdominal and chest pain EXAM: CT ABDOMEN AND PELVIS WITH CONTRAST TECHNIQUE: Multidetector CT imaging of the abdomen and pelvis was performed using the standard protocol following bolus administration of intravenous contrast. CONTRAST:  147mL OMNIPAQUE IOHEXOL 300 MG/ML  SOLN COMPARISON:  None. FINDINGS: Lower chest: Atelectatic changes in the otherwise clear lung bases. Borderline cardiomegaly. Coronary artery calcifications. Coronary stenting is noted. Hepatobiliary: Diffuse hepatic hypoattenuation compatible with hepatic steatosis. Smooth surface contour. No concerning focal liver lesion. Normal gallbladder and biliary tree. No visible calcified gallstones. Pancreas: No pancreatic ductal dilatation or surrounding inflammatory changes. Spleen: Normal in size. No concerning splenic lesions. Adrenals/Urinary Tract: Normal adrenal glands. Kidneys enhance symmetrically and uniformly. Mild symmetric bilateral perinephric stranding, a nonspecific finding . Bilateral cortical scarring. Few scattered fluid attenuation cysts present in both kidneys. No concerning focal renal lesion. No urolithiasis or hydronephrosis. Circumferential bladder wall thickening is noted, slightly greater than expected for underdistention. Stomach/Bowel: Distal esophagus, stomach and duodenal sweep are unremarkable. No small bowel wall thickening or dilatation. No evidence of obstruction. A normal appendix is visualized. No colonic dilatation or wall thickening. Vascular/Lymphatic: Atherosclerotic  calcifications within the abdominal aorta and branch vessels. No aneurysm or ectasia. Scattered phleboliths in the pelvis. Calcifications along the spermatic cords. No enlarged abdominopelvic lymph nodes. Reproductive: Indentation of the bladder base by a borderline enlarged prostate. No concerning focal prostate or seminal vesicular lesion. Right hydrocele noted. Possible left hydrocele, below the margins of imaging Other: No abdominopelvic free air or fluid. No bowel containing hernia. Musculoskeletal: Multilevel degenerative changes are present in the imaged portions of the spine. Additional degenerative changes in the hips and pelvis including partial fusion of the bilateral SI joints. No acute osseous abnormality or suspicious osseous lesion. IMPRESSION: 1. Mild circumferential bladder wall thickening, may be related to underdistention though given some nonspecific bilateral perinephric stranding, should warrant assessment of urinary symptoms and consideration of a urinalysis to exclude cystitis and ascending tract infection. 2. Borderline prostatomegaly with indentation of bladder base. Could correlate for clinical symptoms of outlet obstruction. 3. Bilateral renal cortical scarring. 4. Hepatic steatosis. 5. Right hydrocele and suspect left hydrocele as well. 6. Aortic Atherosclerosis (ICD10-I70.0). 7. Coronary artery atherosclerosis. Electronically Signed   By: Lovena Le M.D.   On: 09/16/2020 04:48   DG Chest Portable 1 View  Result Date: 09/16/2020 CLINICAL DATA:  Nausea, shortness of breath EXAM: PORTABLE CHEST 1 VIEW COMPARISON:  11/12/2018 FINDINGS: Cardiomegaly. Prior CABG. Mild vascular congestion. No overt edema. No confluent opacities or effusions. No acute bony abnormality. IMPRESSION: Cardiomegaly, vascular congestion. Electronically Signed   By: Rolm Baptise M.D.   On: 09/16/2020 02:30    Procedures Procedures   Medications Ordered in ED Medications - No data to display  ED Course   I have reviewed the triage vital signs and the nursing notes.  Pertinent labs & imaging results that were available during my care of the patient were reviewed by me and considered in my medical decision making (see chart for details).    MDM Rules/Calculators/A&P  Sensation of sick to the stomach with nausea.  Now resolved.  No chest pain.  EKG shows sinus rhythm with supraventricular bigeminy.  Patient states he feels better now and wants to go home to go to bed.  He denies any chest pain, abdominal pain, shortness of breath.  Describes only a "sick feeling on the stomach".  Labs are reassuring with stable creatinine.  Lipase minimally elevated at 56.  Troponin negative.  Chest x-ray shows mild vascular congestion.  No hypoxia or increased work of breathing  Troponin negative x2.  Urinalysis is negative.  Will send culture.  CT scan obtained given his vague description of his discomfort. Shows bladder wall thickening as well as prostatomegaly Nonspecific perinephric stranding.  Urinalysis however does not show any infection.  We will send culture  CT head obtained also given vague symptoms and anticoagulation use. No hemorrhage. No acute findings.  Patient tolerating PO and ambulatory. States he feels back to baseline, feels well and wants to go home.  Will treat for possible UTI while culture is pending.  Unchanged EKG, troponin negative x2. Low suspicion for ACS. SOme congestion on CXR but no increased work of breathing or hypoxia. Continue home lasix.   Followup with PCP. Return precautions discussed.  Final Clinical Impression(s) / ED Diagnoses Final diagnoses:  Abdominal discomfort    Rx / DC Orders ED Discharge Orders     None        Zellie Jenning, Annie Main, MD 09/16/20 [facility]

## 2020-09-16 NOTE — Discharge Instructions (Addendum)
Your testing is reassuring.  Take the antibiotic for a possible urinary tract infection.  Follow-up with your doctor.  Return to the ED with chest pain, difficulty breathing, any other concerns

## 2020-09-16 NOTE — ED Triage Notes (Addendum)
Pt pov from home with  cc of nausea that has resolved.  Never had any pain. Denies current abd pain or chest pain.   States he feels alright now.  Pt slightly HOH

## 2020-09-17 DIAGNOSIS — I2510 Atherosclerotic heart disease of native coronary artery without angina pectoris: Secondary | ICD-10-CM | POA: Diagnosis not present

## 2020-09-17 DIAGNOSIS — I10 Essential (primary) hypertension: Secondary | ICD-10-CM | POA: Diagnosis not present

## 2020-09-18 LAB — URINE CULTURE: Culture: NO GROWTH

## 2020-09-26 ENCOUNTER — Encounter: Payer: Self-pay | Admitting: Internal Medicine

## 2020-09-26 ENCOUNTER — Ambulatory Visit: Payer: Medicare Other | Admitting: Internal Medicine

## 2020-09-27 ENCOUNTER — Ambulatory Visit (INDEPENDENT_AMBULATORY_CARE_PROVIDER_SITE_OTHER): Payer: Medicare Other | Admitting: Internal Medicine

## 2020-09-27 ENCOUNTER — Other Ambulatory Visit: Payer: Self-pay

## 2020-09-27 ENCOUNTER — Encounter: Payer: Self-pay | Admitting: Internal Medicine

## 2020-09-27 VITALS — BP 132/72 | HR 73 | Ht 66.0 in | Wt 221.4 lb

## 2020-09-27 DIAGNOSIS — I2510 Atherosclerotic heart disease of native coronary artery without angina pectoris: Secondary | ICD-10-CM

## 2020-09-27 NOTE — Patient Instructions (Signed)
Medication Instructions:  Your physician recommends that you continue on your current medications as directed. Please refer to the Current Medication list given to you today.  *If you need a refill on your cardiac medications before your next appointment, please call your pharmacy*   Lab Work: None If you have labs (blood work) drawn today and your tests are completely normal, you will receive your results only by: [facility] (if you have MyChart) OR A paper copy in the mail If you have any lab test that is abnormal or we need to change your treatment, we will call you to review the results.   Testing/Procedures: None   Follow-Up: At [facility], you and your health needs are our priority.  As part of our continuing mission to provide you with exceptional heart care, we have created designated Provider Care Teams.  These Care Teams include your primary Cardiologist (physician) and Advanced Practice Providers (APPs -  Physician Assistants and Nurse Practitioners) who all work together to provide you with the care you need, when you need it.  We recommend signing up for the patient portal called "MyChart".  Sign up information is provided on this After Visit Summary.  MyChart is used to connect with patients for Virtual Visits (Telemedicine).  Patients are able to view lab/test results, encounter notes, upcoming appointments, etc.  Non-urgent messages can be sent to your provider as well.   To learn more about what you can do with MyChart, go to [email or web address].    Your next appointment:   Follow up in February 2023  The format for your next appointment:   In Person  Provider:   [name], MD   Other Instructions

## 2020-09-27 NOTE — Progress Notes (Signed)
Cardiology Office Note   Date:  09/27/2020   ID:  [name], Vital 1945/05/10, MRN [id number]  PCP:  Brian Fire, MD  Cardiologist:   Brian Carnes, MD    Pt presents for f/u of CAD      History of Present Illness: [name] is a 75 y.o. male with a history of CAD   He is s/p CABG x 4 in 2011.  In May 2019 he had intervention to native RCA.   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    In 2020 he presented with NSTEMI   Had been off of anticoagulation.   Cath showed patent stent   Paent grafts   Pt says he feels pretty good  No CP   Breathing is pretty good    BP at home runs 242P to 536 systolic       Current Meds  Medication Sig   apixaban (ELIQUIS) 5 MG TABS tablet Take 1 tablet (5 mg total) by mouth 2 (two) times daily.   atorvastatin (LIPITOR) 40 MG tablet Take 1 tablet (40 mg total) by mouth daily at 6 PM.   clopidogrel (PLAVIX) 75 MG tablet    cycloSPORINE (RESTASIS) 0.05 % ophthalmic emulsion Place 1 drop into both eyes daily as needed (dry eye).    furosemide (LASIX) 20 MG tablet Take 3 tablets (60 mg total) by mouth daily.   hydrALAZINE (APRESOLINE) 25 MG tablet Take 1 tablet (25 mg total) by mouth 3 (three) times daily.   HYDROcodone-acetaminophen (NORCO) 5-325 MG tablet Take 1 tablet by mouth every 6 (six) hours as needed for severe pain.   metoprolol succinate (TOPROL-XL) 50 MG 24 hr tablet Take 50 mg by mouth daily.   nitroGLYCERIN (NITROSTAT) 0.4 MG SL tablet Place 1 tablet (0.4 mg total) under the tongue every 5 (five) minutes as needed for chest pain. up to 3 doses.   traMADol (ULTRAM) 50 MG tablet Take 50 mg by mouth 3 (three) times daily.     Allergies:   Lisinopril   Past Medical History:  Diagnosis Date   Apical mural thrombus    Arteriosclerotic cardiovascular disease (ASCVD) 1998   Inferior MI->PCI of the RCA in 1998; acute anterior MI in 06/2002->  PCI of RCA and LAD with DES x2 to RCA, residual  80% ostial D1, and 80% mid CX and EF-40%; CABG-2011, LIMA-LAD, SVG to D1, OM1 & OM2; EF of 35-40% in 07/2010   Chronic anticoagulation    Chronic systolic CHF (congestive heart failure) (HCC)    CVA (cerebral vascular accident) ([place])    HTN (hypertension)    Hyperlipidemia    Keloid    median sternotomy   Pulmonary embolism ([place]) 03/2010   Substance abuse ([place])    formerly cocaine    Past Surgical History:  Procedure Laterality Date   COLONOSCOPY N/A 01/22/2017   Procedure: COLONOSCOPY;  Surgeon: Danie Binder, MD;  [facility];  Service: Endoscopy;  Laterality: N/A;  200   CORONARY ARTERY BYPASS GRAFT  03/18/2010   LIMA-LAD, SVG to diagonal, OM1 & OM2   CORONARY STENT INTERVENTION N/A 08/21/2017   Procedure: CORONARY STENT INTERVENTION;  Surgeon: [name], MD;  Location: [facility];  Service: Cardiovascular;  Laterality: N/A;   LEFT HEART CATH AND CORS/GRAFTS ANGIOGRAPHY N/A 08/21/2017   Procedure: LEFT HEART CATH AND CORS/GRAFTS ANGIOGRAPHY;  Surgeon: [name], MD;  Location: [facility];  Service: Cardiovascular;  Laterality: N/A;   LEFT HEART CATH AND CORS/GRAFTS ANGIOGRAPHY N/A 06/21/2018   Procedure: LEFT HEART CATH AND CORS/GRAFTS ANGIOGRAPHY;  Surgeon: Nelva Bush, MD;  Location: [facility];  Service: Cardiovascular;  Laterality: N/A;   POLYPECTOMY  01/22/2017   Procedure: POLYPECTOMY;  Surgeon: Danie Binder, MD;  [facility];  Service: Endoscopy;;  Transverse(CS) and sigmoid colon(HS)   PTCA  06/1996   LAD & RCA   TEE WITHOUT CARDIOVERSION N/A 06/10/2012   Procedure: TRANSESOPHAGEAL ECHOCARDIOGRAM (TEE);  Surgeon: Josue Hector, MD;  [facility];  Service: Cardiovascular;  Laterality: N/A;     Social History:  The patient  reports that he quit smoking about 18 years ago. He has never used smokeless tobacco. He reports current alcohol use. He reports that he does not use drugs.   Family History:   The patient's family history includes Cancer (age of onset: 58) in his mother.    ROS:  Please see the history of present illness. All other systems are reviewed and  Negative to the above problem except as noted.    PHYSICAL EXAM: VS:  BP 132/72   Pulse 73   Ht 5\' 6"  (1.676 m)   Wt 221 lb 6.4 oz (100.4 kg)   SpO2 97%   BMI 35.73 kg/m   GEN: Obese 75 yo in no acute distress  HEENT: normal  Neck: no JVD,  Cardiac: RRR; no murmurs   No LE edema  Respiratory:  clear to auscultation bilaterally, GI: soft, nontender, nondistended, + BS  No hepatomegaly  MS: no deformity Moving all extremities   Skin: warm and dry, no rash Neuro:  Strength and sensation are intact Psych: euthymic mood, full affect   EKG:  EKG is not ordered today.  Cath; The following studies were reviewed today: Cath March 2020- Conclusions: Severe native coronary artery disease, including occluded proximal LAD and LCx.  RCA with patent mid/distal stents and mild in-stent restenosis. Widely patent LIMA-LAD, SVG-OM1-OM, and SVG-D. Mildly elevated left ventricular filling pressure.   Recommendations: Medical therapy; question coronary embolism with spontaneous lysis.  Plan to restart heparin infusion 2 hours after TR band removal and resumption of apixaban tomorrow AM if no evidence of bleeding. Continue clopidogrel 75 mg daily.  Defer continuing aspirin in the setting of long-term anticoagulation and clopidogrel use. Aggressive secondary prevention.    Lipid Panel    Component Value Date/Time   CHOL 76 07/18/2020 1119   TRIG 83 07/18/2020 1119   HDL 36 (L) 07/18/2020 1119   CHOLHDL 2.1 07/18/2020 1119   VLDL 17 07/18/2020 1119   LDLCALC 23 07/18/2020 1119      Wt Readings from Last 3 Encounters:  09/27/20 221 lb 6.4 oz (100.4 kg)  09/16/20 205 lb (93 kg)  02/22/20 219 lb 12.8 oz (99.7 kg)      ASSESSMENT AND PLAN:  1  CAD  Pt without symptoms of angina   Follow    2  Hx systolic CHF   LVEF  by  echo 35-40%   Volume status looks good   I would recomm adding back imdur which is is not taking    I would also take hydralazine tid (not bid)   Watch Na   3  Hx mural LV thrombus Continue anticoagulatoin   4  Hx R brain CVA  2014  Continue anticoagulation     5  HTN    BP is  good/fair   Reviewed meds   Corrected dosing regimen  Follow up next winter     Current medicines are reviewed at length with the patient today.  The patient does not have concerns regarding medicines.  Signed, Brian Carnes, MD  09/27/2020 2:06 PM    [facility] [address], [place], [place]  [address] Phone: [phone]; Fax: [phone]

## 2020-10-02 ENCOUNTER — Other Ambulatory Visit: Payer: Self-pay | Admitting: Internal Medicine

## 2020-10-17 DIAGNOSIS — I2510 Atherosclerotic heart disease of native coronary artery without angina pectoris: Secondary | ICD-10-CM | POA: Diagnosis not present

## 2020-10-17 DIAGNOSIS — I10 Essential (primary) hypertension: Secondary | ICD-10-CM | POA: Diagnosis not present

## 2020-10-18 ENCOUNTER — Observation Stay (HOSPITAL_COMMUNITY): Payer: Medicare Other

## 2020-10-18 ENCOUNTER — Observation Stay (HOSPITAL_COMMUNITY)
Admission: EM | Admit: 2020-10-18 | Discharge: 2020-10-19 | Disposition: A | Discharge status: Home / Self Care | Payer: Medicare Other | Attending: Internal Medicine | Admitting: Internal Medicine

## 2020-10-18 ENCOUNTER — Emergency Department (HOSPITAL_COMMUNITY): Payer: Medicare Other

## 2020-10-18 ENCOUNTER — Observation Stay (HOSPITAL_BASED_OUTPATIENT_CLINIC_OR_DEPARTMENT_OTHER): Payer: Medicare Other

## 2020-10-18 ENCOUNTER — Encounter (HOSPITAL_COMMUNITY): Payer: Self-pay | Admitting: Emergency Medicine

## 2020-10-18 ENCOUNTER — Other Ambulatory Visit: Payer: Self-pay

## 2020-10-18 DIAGNOSIS — I6330 Cerebral infarction due to thrombosis of unspecified cerebral artery: Secondary | ICD-10-CM | POA: Diagnosis present

## 2020-10-18 DIAGNOSIS — K760 Fatty (change of) liver, not elsewhere classified: Secondary | ICD-10-CM | POA: Diagnosis not present

## 2020-10-18 DIAGNOSIS — E782 Mixed hyperlipidemia: Secondary | ICD-10-CM | POA: Diagnosis present

## 2020-10-18 DIAGNOSIS — R748 Abnormal levels of other serum enzymes: Secondary | ICD-10-CM | POA: Diagnosis not present

## 2020-10-18 DIAGNOSIS — D696 Thrombocytopenia, unspecified: Secondary | ICD-10-CM | POA: Diagnosis not present

## 2020-10-18 DIAGNOSIS — R7401 Elevation of levels of liver transaminase levels: Secondary | ICD-10-CM | POA: Diagnosis not present

## 2020-10-18 DIAGNOSIS — I5022 Chronic systolic (congestive) heart failure: Secondary | ICD-10-CM | POA: Diagnosis not present

## 2020-10-18 DIAGNOSIS — Z79899 Other long term (current) drug therapy: Secondary | ICD-10-CM | POA: Insufficient documentation

## 2020-10-18 DIAGNOSIS — Z20822 Contact with and (suspected) exposure to covid-19: Secondary | ICD-10-CM | POA: Diagnosis not present

## 2020-10-18 DIAGNOSIS — R109 Unspecified abdominal pain: Secondary | ICD-10-CM | POA: Diagnosis not present

## 2020-10-18 DIAGNOSIS — G4733 Obstructive sleep apnea (adult) (pediatric): Secondary | ICD-10-CM | POA: Diagnosis present

## 2020-10-18 DIAGNOSIS — Z86711 Personal history of pulmonary embolism: Secondary | ICD-10-CM

## 2020-10-18 DIAGNOSIS — Z87891 Personal history of nicotine dependence: Secondary | ICD-10-CM | POA: Diagnosis not present

## 2020-10-18 DIAGNOSIS — I2699 Other pulmonary embolism without acute cor pulmonale: Secondary | ICD-10-CM | POA: Diagnosis not present

## 2020-10-18 DIAGNOSIS — G4731 Primary central sleep apnea: Secondary | ICD-10-CM

## 2020-10-18 DIAGNOSIS — I110 Hypertensive heart disease with heart failure: Secondary | ICD-10-CM | POA: Diagnosis not present

## 2020-10-18 DIAGNOSIS — I2609 Other pulmonary embolism with acute cor pulmonale: Secondary | ICD-10-CM

## 2020-10-18 DIAGNOSIS — Z8673 Personal history of transient ischemic attack (TIA), and cerebral infarction without residual deficits: Secondary | ICD-10-CM | POA: Diagnosis not present

## 2020-10-18 DIAGNOSIS — I25810 Atherosclerosis of coronary artery bypass graft(s) without angina pectoris: Secondary | ICD-10-CM | POA: Insufficient documentation

## 2020-10-18 DIAGNOSIS — I10 Essential (primary) hypertension: Secondary | ICD-10-CM | POA: Diagnosis not present

## 2020-10-18 DIAGNOSIS — I509 Heart failure, unspecified: Secondary | ICD-10-CM

## 2020-10-18 DIAGNOSIS — E785 Hyperlipidemia, unspecified: Secondary | ICD-10-CM | POA: Diagnosis present

## 2020-10-18 DIAGNOSIS — I255 Ischemic cardiomyopathy: Secondary | ICD-10-CM | POA: Diagnosis present

## 2020-10-18 DIAGNOSIS — I2510 Atherosclerotic heart disease of native coronary artery without angina pectoris: Secondary | ICD-10-CM

## 2020-10-18 DIAGNOSIS — Z951 Presence of aortocoronary bypass graft: Secondary | ICD-10-CM

## 2020-10-18 DIAGNOSIS — I517 Cardiomegaly: Secondary | ICD-10-CM | POA: Diagnosis not present

## 2020-10-18 DIAGNOSIS — I5042 Chronic combined systolic (congestive) and diastolic (congestive) heart failure: Secondary | ICD-10-CM | POA: Insufficient documentation

## 2020-10-18 DIAGNOSIS — I2694 Multiple subsegmental pulmonary emboli without acute cor pulmonale: Secondary | ICD-10-CM

## 2020-10-18 DIAGNOSIS — R1012 Left upper quadrant pain: Secondary | ICD-10-CM | POA: Diagnosis present

## 2020-10-18 DIAGNOSIS — Z7901 Long term (current) use of anticoagulants: Secondary | ICD-10-CM | POA: Diagnosis not present

## 2020-10-18 DIAGNOSIS — R7989 Other specified abnormal findings of blood chemistry: Secondary | ICD-10-CM

## 2020-10-18 LAB — CBC WITH DIFFERENTIAL/PLATELET
Abs Immature Granulocytes: 0.01 10*3/uL (ref 0.00–0.07)
Basophils Absolute: 0.1 10*3/uL (ref 0.0–0.1)
Basophils Relative: 1 %
Eosinophils Absolute: 0.2 10*3/uL (ref 0.0–0.5)
Eosinophils Relative: 3 %
HCT: 49.9 % (ref 39.0–52.0)
Hemoglobin: 15.8 g/dL (ref 13.0–17.0)
Immature Granulocytes: 0 %
Lymphocytes Relative: 44 %
Lymphs Abs: 3 10*3/uL (ref 0.7–4.0)
MCH: 29.3 pg (ref 26.0–34.0)
MCHC: 31.7 g/dL (ref 30.0–36.0)
MCV: 92.6 fL (ref 80.0–100.0)
Monocytes Absolute: 1 10*3/uL (ref 0.1–1.0)
Monocytes Relative: 14 %
Neutro Abs: 2.6 10*3/uL (ref 1.7–7.7)
Neutrophils Relative %: 38 %
Platelets: 128 10*3/uL — ABNORMAL LOW (ref 150–400)
RBC: 5.39 MIL/uL (ref 4.22–5.81)
RDW: 13.4 % (ref 11.5–15.5)
WBC: 6.7 10*3/uL (ref 4.0–10.5)
nRBC: 0 % (ref 0.0–0.2)

## 2020-10-18 LAB — ECHOCARDIOGRAM COMPLETE
AR max vel: 2.32 cm2
AV Area VTI: 2.21 cm2
AV Area mean vel: 2.12 cm2
AV Mean grad: 2.3 mmHg
AV Peak grad: 4.8 mmHg
Ao pk vel: 1.09 m/s
Area-P 1/2: 3.91 cm2
Height: 66 in
S' Lateral: 4 cm
Weight: 3548.52 oz

## 2020-10-18 LAB — COMPREHENSIVE METABOLIC PANEL
ALT: 56 U/L — ABNORMAL HIGH (ref 0–44)
AST: 38 U/L (ref 15–41)
Albumin: 3.9 g/dL (ref 3.5–5.0)
Alkaline Phosphatase: 70 U/L (ref 38–126)
Anion gap: 6 (ref 5–15)
BUN: 13 mg/dL (ref 8–23)
CO2: 26 mmol/L (ref 22–32)
Calcium: 8.2 mg/dL — ABNORMAL LOW (ref 8.9–10.3)
Chloride: 105 mmol/L (ref 98–111)
Creatinine, Ser: 1.28 mg/dL — ABNORMAL HIGH (ref 0.61–1.24)
GFR, Estimated: 59 mL/min — ABNORMAL LOW (ref >60)
Glucose, Bld: 121 mg/dL — ABNORMAL HIGH (ref 70–99)
Potassium: 3.6 mmol/L (ref 3.5–5.1)
Sodium: 137 mmol/L (ref 135–145)
Total Bilirubin: 1.6 mg/dL — ABNORMAL HIGH (ref 0.3–1.2)
Total Protein: 7.1 g/dL (ref 6.5–8.1)

## 2020-10-18 LAB — LIPASE, BLOOD: Lipase: 65 U/L — ABNORMAL HIGH (ref 11–51)

## 2020-10-18 LAB — RESP PANEL BY RT-PCR (FLU A&B, COVID) ARPGX2
Influenza A by PCR: NEGATIVE
Influenza B by PCR: NEGATIVE
SARS Coronavirus 2 by RT PCR: NEGATIVE

## 2020-10-18 LAB — APTT: aPTT: 146 seconds — ABNORMAL HIGH (ref 24–36)

## 2020-10-18 LAB — HEPARIN LEVEL (UNFRACTIONATED): Heparin Unfractionated: 0.99 IU/mL — ABNORMAL HIGH (ref 0.30–0.70)

## 2020-10-18 LAB — TROPONIN I (HIGH SENSITIVITY)
Troponin I (High Sensitivity): 10 ng/L (ref <18)
Troponin I (High Sensitivity): 9 ng/L (ref <18)

## 2020-10-18 LAB — BRAIN NATRIURETIC PEPTIDE: B Natriuretic Peptide: 187 pg/mL — ABNORMAL HIGH (ref 0.0–100.0)

## 2020-10-18 MED ORDER — ISOSORBIDE MONONITRATE ER 60 MG PO TB24
60.0000 mg | ORAL_TABLET | Freq: Every day | ORAL | Status: DC
  Administered 2020-10-18 – 2020-10-19 (×2): 60 mg via ORAL
  Filled 2020-10-18 (×2): qty 1

## 2020-10-18 MED ORDER — IOHEXOL 350 MG/ML SOLN
100.0000 mL | Freq: Once | INTRAVENOUS | Status: AC | PRN
  Administered 2020-10-18: 100 mL via INTRAVENOUS

## 2020-10-18 MED ORDER — CLOPIDOGREL BISULFATE 75 MG PO TABS
75.0000 mg | ORAL_TABLET | Freq: Every day | ORAL | Status: DC
  Administered 2020-10-18 – 2020-10-19 (×2): 75 mg via ORAL
  Filled 2020-10-18 (×2): qty 1

## 2020-10-18 MED ORDER — HYDROCODONE-ACETAMINOPHEN 5-325 MG PO TABS: 1.0000 | ORAL_TABLET | Freq: Four times a day (QID) | ORAL | Status: DC | PRN

## 2020-10-18 MED ORDER — TRAMADOL HCL 50 MG PO TABS
50.0000 mg | ORAL_TABLET | Freq: Three times a day (TID) | ORAL | Status: DC
Start: 2020-10-18 — End: 2020-10-19
  Administered 2020-10-18 – 2020-10-19 (×3): 50 mg via ORAL
  Filled 2020-10-18 (×3): qty 1

## 2020-10-18 MED ORDER — METOPROLOL SUCCINATE ER 50 MG PO TB24
50.0000 mg | ORAL_TABLET | Freq: Every day | ORAL | Status: DC
  Administered 2020-10-18 – 2020-10-19 (×2): 50 mg via ORAL
  Filled 2020-10-18 (×2): qty 1

## 2020-10-18 MED ORDER — NITROGLYCERIN 0.4 MG SL SUBL: 0.4000 mg | SUBLINGUAL_TABLET | SUBLINGUAL | Status: DC | PRN

## 2020-10-18 MED ORDER — ATORVASTATIN CALCIUM 40 MG PO TABS
40.0000 mg | ORAL_TABLET | Freq: Every day | ORAL | Status: DC
  Administered 2020-10-18: 40 mg via ORAL
  Filled 2020-10-18: qty 1

## 2020-10-18 MED ORDER — PERFLUTREN LIPID MICROSPHERE
1.0000 mL | INTRAVENOUS | Status: AC | PRN
  Administered 2020-10-18: 3 mL via INTRAVENOUS
  Filled 2020-10-18: qty 10

## 2020-10-18 MED ORDER — HYDRALAZINE HCL 25 MG PO TABS
25.0000 mg | ORAL_TABLET | Freq: Three times a day (TID) | ORAL | Status: DC
  Administered 2020-10-18 – 2020-10-19 (×4): 25 mg via ORAL
  Filled 2020-10-18 (×4): qty 1

## 2020-10-18 MED ORDER — HEPARIN BOLUS VIA INFUSION
3000.0000 [IU] | Freq: Once | INTRAVENOUS | Status: AC
  Administered 2020-10-18: 3000 [IU] via INTRAVENOUS

## 2020-10-18 MED ORDER — CYCLOSPORINE 0.05 % OP EMUL: 1.0000 [drp] | Freq: Every day | OPHTHALMIC | Status: DC | PRN

## 2020-10-18 MED ORDER — ACETAMINOPHEN 325 MG PO TABS: 650.0000 mg | ORAL_TABLET | Freq: Four times a day (QID) | ORAL | Status: DC | PRN

## 2020-10-18 MED ORDER — HEPARIN (PORCINE) 25000 UT/250ML-% IV SOLN
1150.0000 [IU]/h | INTRAVENOUS | Status: DC
  Administered 2020-10-18: 1400 [IU]/h via INTRAVENOUS
  Administered 2020-10-19: 1150 [IU]/h via INTRAVENOUS
  Filled 2020-10-18 (×2): qty 250

## 2020-10-18 NOTE — ED Provider Notes (Addendum)
[facility] EMERGENCY DEPARTMENT Provider Note   CSN: [id number] Arrival date & time: 10/18/20  0126     History Chief Complaint  Patient presents with   Abdominal Pain    [name] is a 74 y.o. male.  Patient presents to the emergency department for evaluation of left upper abdominal pain.  Symptoms have been present for 1 day.  He thinks that it started after he picked up a 24 pack of water.  Daughter reports that he has had 2 heart attacks.  She says that he does not like hospitals and would not be here and less he was feeling bad.  No shortness of breath.  No associated chest pain.  Pain is not reproducible.      Past Medical History:  Diagnosis Date   Apical mural thrombus    Arteriosclerotic cardiovascular disease (ASCVD) 1998   Inferior MI->PCI of the RCA in 1998; acute anterior MI in 06/2002->  PCI of RCA and LAD with DES x2 to RCA, residual 80% ostial D1, and 80% mid CX and EF-40%; CABG-2011, LIMA-LAD, SVG to D1, OM1 & OM2; EF of 35-40% in 07/2010   Chronic anticoagulation    Chronic systolic CHF (congestive heart failure) (HCC)    CVA (cerebral vascular accident) ([place])    HTN (hypertension)    Hyperlipidemia    Keloid    median sternotomy   Pulmonary embolism ([place]) 03/2010   Substance abuse ([place])    formerly cocaine    Patient Active Problem List   Diagnosis Date Noted   Hx of CABG 02/22/2020   Ischemic cardiomyopathy 02/22/2020   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   History of CVA (cerebrovascular accident) 02/22/2020   History of pulmonary embolism 02/22/2020   Acute on chronic systolic (congestive) heart failure ([place]) 11/10/2018   Pneumonia due to COVID-19 virus 11/10/2018   NSTEMI (non-ST elevated myocardial infarction) ([place]) 06/21/2018   CHF (congestive heart failure) ([place]) 03/47/4259   Chronic systolic heart failure ([place]) 08/21/2017   Abnormal nuclear stress test    CHF exacerbation ([place]) 03/20/2017   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   TIA (transient ischemic attack) 03/08/2017   Visual field defect of right eye 03/07/2017   CKD (chronic kidney disease), stage III ([place]) 03/07/2017   Hyperglycemia 03/07/2017   OSA (obstructive sleep apnea) 03/07/2017   Special screening for malignant neoplasms, colon    Central sleep apnea 11/01/2012   Chronic anticoagulation-discontinued 10/19/2012   Cerebral infarction ([place]) 06/09/2012   Arteriosclerotic cardiovascular disease (ASCVD)    Pulmonary embolism ([place]) 03/07/2010   Hyperlipidemia 11/24/2008   Essential hypertension 11/24/2008    Past Surgical History:  Procedure Laterality Date   COLONOSCOPY N/A 01/22/2017   Procedure: COLONOSCOPY;  Surgeon: Danie Binder, MD;  [facility];  Service: Endoscopy;  Laterality: N/A;  200   CORONARY ARTERY BYPASS GRAFT  03/18/2010   LIMA-LAD, SVG to diagonal, OM1 & OM2   CORONARY STENT INTERVENTION N/A 08/21/2017   Procedure: CORONARY STENT INTERVENTION;  Surgeon: [name], MD;  Location: [facility];  Service: Cardiovascular;  Laterality: N/A;   LEFT HEART CATH AND CORS/GRAFTS ANGIOGRAPHY N/A 08/21/2017   Procedure: LEFT HEART CATH AND CORS/GRAFTS ANGIOGRAPHY;  Surgeon: [name], MD;  Location: [facility];  Service: Cardiovascular;  Laterality: N/A;   LEFT HEART CATH AND CORS/GRAFTS ANGIOGRAPHY N/A 06/21/2018   Procedure: LEFT HEART CATH AND CORS/GRAFTS ANGIOGRAPHY;  Surgeon: Nelva Bush, MD;  Location: [facility];  Service: Cardiovascular;  Laterality: N/A;   POLYPECTOMY  01/22/2017   Procedure: POLYPECTOMY;  Surgeon: Danie Binder, MD;  Location[facility];  Service: Endoscopy;;  Transverse(CS) and sigmoid colon(HS)   PTCA  06/1996   LAD & RCA   TEE WITHOUT CARDIOVERSION N/A 06/10/2012   Procedure: TRANSESOPHAGEAL ECHOCARDIOGRAM (TEE);  Surgeon: Josue Hector, MD;  Location: [facility];  Service: Cardiovascular;  Laterality: N/A;       Family  History  Problem Relation Age of Onset   Cancer Mother 32    Social History   Tobacco Use   Smoking status: Former    Types: Cigarettes    Quit date: 2004    Years since quitting: 18.5   Smokeless tobacco: Never  Vaping Use   Vaping Use: Never used  Substance Use Topics   Alcohol use: Yes    Comment: rare   Drug use: No    Types: Cocaine    Comment: Former cocaine abuse - last use for 51th birthday    Home Medications Prior to Admission medications   Medication Sig Start Date End Date Taking? Authorizing Provider  apixaban (ELIQUIS) 5 MG TABS tablet Take 1 tablet (5 mg total) by mouth 2 (two) times daily. 06/22/18   Minus Breeding, MD  atorvastatin (LIPITOR) 40 MG tablet Take 1 tablet (40 mg total) by mouth daily at 6 PM. 03/09/17   Debbe Odea, MD  clopidogrel (PLAVIX) 75 MG tablet     [provider]  cycloSPORINE (RESTASIS) 0.05 % ophthalmic emulsion Place 1 drop into both eyes daily as needed (dry eye).     [provider]  furosemide (LASIX) 20 MG tablet TAKE 3 TABLETS BY MOUTH EVERY DAY 10/02/20   Fay Records, MD  hydrALAZINE (APRESOLINE) 25 MG tablet Take 1 tablet (25 mg total) by mouth 3 (three) times daily. 09/12/20 12/11/20  Fay Records, MD  HYDROcodone-acetaminophen (NORCO) 5-325 MG tablet Take 1 tablet by mouth every 6 (six) hours as needed for severe pain. 11/06/19   Sherwood Gambler, MD  isosorbide mononitrate (IMDUR) 60 MG 24 hr tablet Take 1 tablet (60 mg total) by mouth daily. Patient not taking: Reported on 09/27/2020 09/12/20 12/11/20  Fay Records, MD  metoprolol succinate (TOPROL-XL) 50 MG 24 hr tablet Take 50 mg by mouth daily. 04/27/17   [provider]  nitroGLYCERIN (NITROSTAT) 0.4 MG SL tablet Place 1 tablet (0.4 mg total) under the tongue every 5 (five) minutes as needed for chest pain. up to 3 doses. 07/05/13   Lendon Colonel, NP  traMADol (ULTRAM) 50 MG tablet Take 50 mg by mouth 3 (three) times daily. 11/29/19   [provider]    Allergies    Lisinopril  Review of Systems   Review of Systems  Gastrointestinal:  Positive for abdominal pain.  All other systems reviewed and are negative.  Physical Exam Updated Vital Signs BP 138/67   Pulse (!) 57   Temp 98.1 F (36.7 C)   Resp 10   Ht 5\' 6"  (1.676 m)   Wt 100.4 kg   SpO2 99%   BMI 35.73 kg/m   Physical Exam Vitals and nursing note reviewed.  Constitutional:      General: He is not in acute distress.    Appearance: Normal appearance. He is well-developed.  HENT:     Head: Normocephalic and atraumatic.     Right Ear: Hearing normal.     Left Ear: Hearing  normal.     Nose: Nose normal.  Eyes:     Conjunctiva/sclera: Conjunctivae normal.     Pupils: Pupils are equal, round, and reactive to light.  Cardiovascular:     Rate and Rhythm: Regular rhythm.     Heart sounds: S1 normal and S2 normal. No murmur heard.   No friction rub. No gallop.  Pulmonary:     Effort: Pulmonary effort is normal. No respiratory distress.     Breath sounds: Normal breath sounds.  Chest:     Chest wall: No tenderness.  Abdominal:     General: Bowel sounds are normal.     Palpations: Abdomen is soft.     Tenderness: There is no abdominal tenderness. There is no guarding or rebound. Negative signs include Murphy's sign and McBurney's sign.     Hernia: No hernia is present.  Musculoskeletal:        General: Normal range of motion.     Cervical back: Normal range of motion and neck supple.  Skin:    General: Skin is warm and dry.     Findings: No rash.  Neurological:     Mental Status: He is alert and oriented to person, place, and time.     GCS: GCS eye subscore is 4. GCS verbal subscore is 5. GCS motor subscore is 6.     Cranial Nerves: No cranial nerve deficit.     Sensory: No sensory deficit.     Coordination: Coordination normal.  Psychiatric:        Speech: Speech normal.        Behavior: Behavior normal.        Thought Content: Thought  content normal.    ED Results / Procedures / Treatments   Labs (all labs ordered are listed, but only abnormal results are displayed) Labs Reviewed  CBC WITH DIFFERENTIAL/PLATELET - Abnormal; Notable for the following components:      Result Value   Platelets 128 (*)    All other components within normal limits  COMPREHENSIVE METABOLIC PANEL - Abnormal; Notable for the following components:   Glucose, Bld 121 (*)    Creatinine, Ser 1.28 (*)    Calcium 8.2 (*)    ALT 56 (*)    Total Bilirubin 1.6 (*)    GFR, Estimated 59 (*)    All other components within normal limits  LIPASE, BLOOD - Abnormal; Notable for the following components:   Lipase 65 (*)    All other components within normal limits  BRAIN NATRIURETIC PEPTIDE - Abnormal; Notable for the following components:   B Natriuretic Peptide 187.0 (*)    All other components within normal limits  TROPONIN I (HIGH SENSITIVITY)  TROPONIN I (HIGH SENSITIVITY)    EKG EKG Interpretation  Date/Time:  Thursday October 18 2020 02:05:32 EDT Ventricular Rate:  64 PR Interval:  117 QRS Duration: 126 QT Interval:  451 QTC Calculation: 466 R Axis:   78 Text Interpretation: Sinus rhythm Borderline short PR interval Probable left atrial enlargement Right bundle branch block Inferior infarct, age indeterminate Anterolateral infarct, age indeterminate No significant change since last tracing Confirmed by Orpah Greek [phone]) on 10/18/2020 2:11:50 AM  Radiology CT Angio Chest Pulmonary Embolism (PE) W or WO Contrast  Result Date: 10/18/2020 CLINICAL DATA:  Left abdominal tightness after caring cases of water, history of pulmonary embolism as well, concern for acute pulmonary embolism. EXAM: CT ANGIOGRAPHY CHEST CT ABDOMEN AND PELVIS WITH CONTRAST TECHNIQUE: Multidetector CT imaging of the chest  was performed using the standard protocol during bolus administration of intravenous contrast. Multiplanar CT image reconstructions and MIPs  were obtained to evaluate the vascular anatomy. Multidetector CT imaging of the abdomen and pelvis was performed using the standard protocol during bolus administration of intravenous contrast. CONTRAST:  121mL OMNIPAQUE IOHEXOL 350 MG/ML SOLN COMPARISON:  CT abdomen pelvis 09/16/2020, radiograph 09/16/2020, 10/18/2020, CT angiography of the chest 03/30/2020 FINDINGS: CTA CHEST FINDINGS Cardiovascular: Satisfactory opacification of the pulmonary arteries. Peripherally marginated filling defects with some luminal narrowing or noted involving branches of the right lower lobe superior segmental pulmonary artery and lateral segmental pulmonary artery of the right middle lobe (5/191) some additional web-like filling defects versus suboptimal opacification seen involving the posterior lateral basal segments of the right lower lobe as well. No other discrete pulmonary arterial filling defects are identified. Central pulmonary artery enlargement is noted. Background of cardiomegaly and biatrial enlargement as well as evidence of prior sternotomy and CABG. No pericardial effusion. Slight reflux of contrast into the hepatic veins and IVC. Suboptimal opacification of the thoracic aorta for luminal assessment. Atherosclerotic plaque within the normal caliber aorta. Gross acute luminal abnormality is discernible. No periaortic stranding or hemorrhage. Tortuosity of the otherwise normally branching great vessels. Mediastinum/Nodes: Scattered low-attenuation subcentimeter mediastinal and hilar nodes are favored to be reactive. No worrisome axillary adenopathy. Thyroid gland is unremarkable. No acute abnormality of the trachea or esophagus. Postsurgical changes in the anterior mediastinum without free mediastinal air or fluid. Lungs/Pleura: Extensive centrilobular and paraseptal emphysematous changes throughout the lungs. Diffuse airways thickening and scattered secretions likely reflecting some chronic bronchitic features as well  given similarity of appearance in the lung bases on comparison imaging. Musculoskeletal: Prior sternotomy. Multilevel degenerative changes are present in the imaged portions of the spine. No acute osseous abnormality or suspicious osseous lesion. No worrisome chest wall mass or lesion. Review of the MIP images confirms the above findings. CT ABDOMEN and PELVIS FINDINGS Hepatobiliary: Diffuse hepatic hypoattenuation compatible with hepatic steatosis. No concerning focal liver lesion. Smooth liver surface contour. Normal gallbladder and biliary tree without visible calcified gallstone. Pancreas: No pancreatic ductal dilatation or surrounding inflammatory changes. Spleen: Stable lobular contours of the spleen, otherwise normal in size without concerning focal lesion or acute abnormality. Adrenals/Urinary Tract: Normal adrenal glands. Kidneys enhance and excrete symmetrically and uniformly. Mild bilateral perinephric stranding is unchanged from comparison with areas of cortical scarring bilaterally. Few scattered subcentimeter hypoattenuating foci are present in both kidneys too small to fully characterize on CT imaging but statistically likely benign. No concerning focal renal lesion. No urolithiasis or hydronephrosis. Circumferential bladder wall thickening, greater than expected for mere underdistention with some indentation of bladder base by the median lobe hypertrophy of the prostate. Stomach/Bowel: Distal esophagus, stomach and duodenum are unremarkable. No small bowel thickening or dilatation is seen. Normal appendix in the right lower quadrant in a retrocecal position. No colonic dilatation or wall thickening. No evidence of bowel obstruction. Vascular/Lymphatic: Atherosclerotic calcifications throughout the abdominal aorta and branch vessels including plaque at the splanchnic and renal artery ostia, incompletely assessed on this non angiographic technique. No aneurysm or ectasia. No suspicious or enlarged  lymph nodes in the included lymphatic chains. Reproductive: Borderline prostatomegaly with median lobe hypertrophy. Seminal vesicles are unremarkable. Other: Mild body wall edema. No abdominopelvic free air or fluid. No bowel containing hernias. Musculoskeletal: Multilevel discogenic and facet degenerative changes present throughout the lumbar levels. Additional degenerative changes and enthesopathy about the hips pelvis. Partial fusion of the SI joints.  No acute osseous abnormality or suspicious osseous lesion. Musculature is normal and symmetric. Review of the MIP images confirms the above findings. IMPRESSION: CT angiography chest: 1. Age indeterminate pulmonary artery emboli involving the segmental branches of the right middle and lower lobe in a region of prior pulmonary embolic disease. Some these filling defects demonstrate a peripheral and web like configuration as well as narrowing of the pulmonary arteries suggests a chronic process while others result in more complete opacification of the pulmonary artery. Acute chronic embolic phenomenon may be present. 2. Background of cardiomegaly limits assessment of right heart strain on CT imaging. Secondary features including pulmonary artery enlargement and reflux of contrast into the hepatic veins certainly suggest elevated right heart pressures of these may be related to underlying parenchymal disease given extensive emphysematous changes and bronchitic features. 3. No other acute intrathoracic process. 4. Prior sternotomy and CABG. CT abdomen pelvis: 1. Persistent circumferential bladder wall thickening, possibly related to bladder outlet obstruction given a borderline enlarged prostate with median lobe hypertrophy. Correlate for clinical features of outlet obstruction and consider urinalysis to exclude a concomitant cystitis. 2. Chronic mild nonspecific perinephric stranding, favored to reflect diminished renal function or advanced age given additional  features of bilateral renal cortical scarring. 3. Hepatic steatosis. 4.  Aortic Atherosclerosis (ICD10-I70.0). These results were called by telephone at the time of interpretation on 10/18/2020 at 4:03 am to provider [facility] , who verbally acknowledged these results. Electronically Signed   By: Lovena Le M.D.   On: 10/18/2020 04:04   CT ABDOMEN PELVIS W CONTRAST  Result Date: 10/18/2020 CLINICAL DATA:  Left abdominal tightness after caring cases of water, history of pulmonary embolism as well, concern for acute pulmonary embolism. EXAM: CT ANGIOGRAPHY CHEST CT ABDOMEN AND PELVIS WITH CONTRAST TECHNIQUE: Multidetector CT imaging of the chest was performed using the standard protocol during bolus administration of intravenous contrast. Multiplanar CT image reconstructions and MIPs were obtained to evaluate the vascular anatomy. Multidetector CT imaging of the abdomen and pelvis was performed using the standard protocol during bolus administration of intravenous contrast. CONTRAST:  115mL OMNIPAQUE IOHEXOL 350 MG/ML SOLN COMPARISON:  CT abdomen pelvis 09/16/2020, radiograph 09/16/2020, 10/18/2020, CT angiography of the chest 03/30/2020 FINDINGS: CTA CHEST FINDINGS Cardiovascular: Satisfactory opacification of the pulmonary arteries. Peripherally marginated filling defects with some luminal narrowing or noted involving branches of the right lower lobe superior segmental pulmonary artery and lateral segmental pulmonary artery of the right middle lobe (5/191) some additional web-like filling defects versus suboptimal opacification seen involving the posterior lateral basal segments of the right lower lobe as well. No other discrete pulmonary arterial filling defects are identified. Central pulmonary artery enlargement is noted. Background of cardiomegaly and biatrial enlargement as well as evidence of prior sternotomy and CABG. No pericardial effusion. Slight reflux of contrast into the hepatic veins and  IVC. Suboptimal opacification of the thoracic aorta for luminal assessment. Atherosclerotic plaque within the normal caliber aorta. Gross acute luminal abnormality is discernible. No periaortic stranding or hemorrhage. Tortuosity of the otherwise normally branching great vessels. Mediastinum/Nodes: Scattered low-attenuation subcentimeter mediastinal and hilar nodes are favored to be reactive. No worrisome axillary adenopathy. Thyroid gland is unremarkable. No acute abnormality of the trachea or esophagus. Postsurgical changes in the anterior mediastinum without free mediastinal air or fluid. Lungs/Pleura: Extensive centrilobular and paraseptal emphysematous changes throughout the lungs. Diffuse airways thickening and scattered secretions likely reflecting some chronic bronchitic features as well given similarity of appearance in the lung bases on comparison  imaging. Musculoskeletal: Prior sternotomy. Multilevel degenerative changes are present in the imaged portions of the spine. No acute osseous abnormality or suspicious osseous lesion. No worrisome chest wall mass or lesion. Review of the MIP images confirms the above findings. CT ABDOMEN and PELVIS FINDINGS Hepatobiliary: Diffuse hepatic hypoattenuation compatible with hepatic steatosis. No concerning focal liver lesion. Smooth liver surface contour. Normal gallbladder and biliary tree without visible calcified gallstone. Pancreas: No pancreatic ductal dilatation or surrounding inflammatory changes. Spleen: Stable lobular contours of the spleen, otherwise normal in size without concerning focal lesion or acute abnormality. Adrenals/Urinary Tract: Normal adrenal glands. Kidneys enhance and excrete symmetrically and uniformly. Mild bilateral perinephric stranding is unchanged from comparison with areas of cortical scarring bilaterally. Few scattered subcentimeter hypoattenuating foci are present in both kidneys too small to fully characterize on CT imaging but  statistically likely benign. No concerning focal renal lesion. No urolithiasis or hydronephrosis. Circumferential bladder wall thickening, greater than expected for mere underdistention with some indentation of bladder base by the median lobe hypertrophy of the prostate. Stomach/Bowel: Distal esophagus, stomach and duodenum are unremarkable. No small bowel thickening or dilatation is seen. Normal appendix in the right lower quadrant in a retrocecal position. No colonic dilatation or wall thickening. No evidence of bowel obstruction. Vascular/Lymphatic: Atherosclerotic calcifications throughout the abdominal aorta and branch vessels including plaque at the splanchnic and renal artery ostia, incompletely assessed on this non angiographic technique. No aneurysm or ectasia. No suspicious or enlarged lymph nodes in the included lymphatic chains. Reproductive: Borderline prostatomegaly with median lobe hypertrophy. Seminal vesicles are unremarkable. Other: Mild body wall edema. No abdominopelvic free air or fluid. No bowel containing hernias. Musculoskeletal: Multilevel discogenic and facet degenerative changes present throughout the lumbar levels. Additional degenerative changes and enthesopathy about the hips pelvis. Partial fusion of the SI joints. No acute osseous abnormality or suspicious osseous lesion. Musculature is normal and symmetric. Review of the MIP images confirms the above findings. IMPRESSION: CT angiography chest: 1. Age indeterminate pulmonary artery emboli involving the segmental branches of the right middle and lower lobe in a region of prior pulmonary embolic disease. Some these filling defects demonstrate a peripheral and web like configuration as well as narrowing of the pulmonary arteries suggests a chronic process while others result in more complete opacification of the pulmonary artery. Acute chronic embolic phenomenon may be present. 2. Background of cardiomegaly limits assessment of right  heart strain on CT imaging. Secondary features including pulmonary artery enlargement and reflux of contrast into the hepatic veins certainly suggest elevated right heart pressures of these may be related to underlying parenchymal disease given extensive emphysematous changes and bronchitic features. 3. No other acute intrathoracic process. 4. Prior sternotomy and CABG. CT abdomen pelvis: 1. Persistent circumferential bladder wall thickening, possibly related to bladder outlet obstruction given a borderline enlarged prostate with median lobe hypertrophy. Correlate for clinical features of outlet obstruction and consider urinalysis to exclude a concomitant cystitis. 2. Chronic mild nonspecific perinephric stranding, favored to reflect diminished renal function or advanced age given additional features of bilateral renal cortical scarring. 3. Hepatic steatosis. 4.  Aortic Atherosclerosis (ICD10-I70.0). These results were called by telephone at the time of interpretation on 10/18/2020 at 4:03 am to provider [facility] , who verbally acknowledged these results. Electronically Signed   By: Lovena Le M.D.   On: 10/18/2020 04:04   DG Abdomen Acute W/Chest  Result Date: 10/18/2020 CLINICAL DATA:  75 year old male with abdominal pain. EXAM: DG ABDOMEN ACUTE WITH 1 VIEW  CHEST COMPARISON:  CT abdomen pelvis dated 09/16/2020. FINDINGS: There is cardiomegaly with mild vascular congestion. No focal consolidation, pleural effusion, or pneumothorax. Median sternotomy wires and CABG vascular clips. No bowel dilatation or evidence of obstruction. No free air or radiopaque calculi. Degenerative changes of the spine. The osseous structures are intact. IMPRESSION: 1. Cardiomegaly with mild vascular congestion. 2. No bowel obstruction. Electronically Signed   By: Anner Crete M.D.   On: 10/18/2020 03:00    Procedures Procedures   Medications Ordered in ED Medications  iohexol (OMNIPAQUE) 350 MG/ML injection 100  mL (100 mLs Intravenous Contrast Given 10/18/20 0328)    ED Course  I have reviewed the triage vital signs and the nursing notes.  Pertinent labs & imaging results that were available during my care of the patient were reviewed by me and considered in my medical decision making (see chart for details).    MDM Rules/Calculators/A&P                          Patient presents to the emergency department for evaluation of upper abdominal discomfort.  Patient reports that it has been present for most of the day.  Patient thinks it may be related to picking up a case of water earlier.  His pain, however, is not reproducible with movements of the torso.  No specific left upper quadrant tenderness on exam either.  He does have a history of MI as well as PE.  He has not short of breath at this time.  Lab work was reassuring.  CT angio chest shows previously documented chronic PEs but cannot rule out acute PE as well.  He does not think he has missed any doses of his Eliquis.  CT abdomen without acute findings.  Will initiate heparinization, admit patient for further treatment of PE.  Addendum, I did discuss patient briefly with Dr. Carson Myrtle, on-call for pulmonary/critical care.  He did agree with heparinization, recommends discussing further with hematology in the morning to determine long-term care.  CRITICAL CARE Performed by: Orpah Greek   Total critical care time: 30 minutes  Critical care time was exclusive of separately billable procedures and treating other patients.  Critical care was necessary to treat or prevent imminent or life-threatening deterioration.  Critical care was time spent personally by me on the following activities: development of treatment plan with patient and/or surrogate as well as nursing, discussions with consultants, evaluation of patient's response to treatment, examination of patient, obtaining history from patient or surrogate, ordering and performing  treatments and interventions, ordering and review of laboratory studies, ordering and review of radiographic studies, pulse oximetry and re-evaluation of patient's condition.   Final Clinical Impression(s) / ED Diagnoses Final diagnoses:  PE (pulmonary thromboembolism) ([place])    Rx / DC Orders ED Discharge Orders     None        Delanda Bulluck, Gwenyth Allegra, MD 10/18/20 0423    Orpah Greek, MD 10/18/20 [id number]

## 2020-10-18 NOTE — Progress Notes (Signed)
  PROGRESS NOTE  Patient admitted earlier this morning. See H&P.   Patient on Eliquis for cerebral thrombosis who presented to the hospital with left upper abdominal pain.  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  Patient reported compliance with twice daily Eliquis dosing.  He was started on IV heparin and admitted to the hospital.  States that he is feeling a little better now, hoping to get some rest.  Remained stable on room air without respiratory distress.  Satting 99%. No LE edema or pain.   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  Check echo and venous doppler. Continue IV heparin.     Status is: Observation  The patient will require care spanning > 2 midnights and should be moved to inpatient because: IV treatments appropriate due to intensity of illness or inability to take PO  Dispo: The patient is from: Home              Anticipated d/c is to: Home              Patient currently is not medically stable to d/c. Remains on IV heparin    Difficult to place patient No       Dessa Phi, DO [facility] 10/18/2020, 1:01 PM  Available via Epic secure chat 7am-7pm After these hours, please refer to coverage provider listed on [email or web address]

## 2020-10-18 NOTE — Progress Notes (Signed)
Patients daughter Kenney Houseman picked up patients home medication (Nitroglycerin).

## 2020-10-18 NOTE — H&P (Signed)
History and Physical  [name] UMP:[id number] DOB: Feb 07, 1946 DOA: 10/18/2020  Referring physician: Orpah Greek, MD PCP: Rosita Fire, MD  Patient coming from: Home  Chief Complaint: Abdominal pain  HPI: [name] is a 75 y.o. 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  This was thought to be related to lifting a 24 pack of water.  Abdominal pain was nonreproducible and was nonradiating.  No known alleviating/aggravating factor.  He denies chest pain, fever, chills.  Patient decided to go to the ED for further evaluation and management  ED Course:  In the emergency department, he was bradycardic, BP was 171/79, other vital signs were within normal range.  Work-up in the ED showed normal CBC except for thrombocytopenia.  BUN to creatinine 13/1.28 (creatinine is within baseline range).  BNP 187, lipase 65, ALT 56.  Influenza A, B, SARS coronavirus 2 was negative. CT angiography of chest showed age indeterminate pulmonary artery emboli involving the segmental branches of the right middle and lower lobe in a region of prior pulmonary embolic disease.  Some of this filling defects suggests a chronic process and acute and chronic embolic phenomena may be present.  Abdominal x-ray with 1 view chest showed cardiomegaly with mild vascular congestion and no bowel obstruction. Patient was started on IV heparin drip, pulmonology consulted agreed with IV heparin drip and recommended phone consult with hematologist in the morning per ED physician.  Hospitalist was asked to admit patient for further evaluation and management.  Review of Systems: Constitutional: Negative for chills and fever.  HENT: Negative  for ear pain and sore throat.   Eyes: Negative for pain and visual disturbance.  Respiratory: Negative for cough, chest tightness and shortness of breath.   Cardiovascular: Negative for chest pain and palpitations.  Gastrointestinal: Positive for left upper abdominal pain and negative for vomiting.  Endocrine: Negative for polyphagia and polyuria.  Genitourinary: Negative for decreased urine volume, dysuria, enuresis Musculoskeletal: Negative for arthralgias and back pain.  Skin: Negative for color change and rash.  Allergic/Immunologic: Negative for immunocompromised state.  Neurological: Negative for tremors, syncope, speech difficulty, weakness, light-headedness and headaches.  Hematological: Does not bruise/bleed easily.  All other systems reviewed and are negative  Review of systems as noted in the HPI. All other systems reviewed and are negative.   Past Medical History:  Diagnosis Date   Apical mural thrombus    Arteriosclerotic cardiovascular disease (ASCVD) 1998   Inferior MI->PCI of the RCA in 1998; acute anterior MI in 06/2002->  PCI of RCA and LAD with DES x2 to RCA, residual 80% ostial D1, and 80% mid CX and EF-40%; CABG-2011, LIMA-LAD, SVG to D1, OM1 & OM2; EF of 35-40% in 07/2010   Chronic anticoagulation    Chronic systolic CHF (congestive heart failure) (HCC)    CVA (cerebral vascular accident) ([place])    HTN (hypertension)    Hyperlipidemia    Keloid    median sternotomy   Pulmonary embolism ([place]) 03/2010   Substance abuse ([place])    formerly cocaine   Past Surgical History:  Procedure Laterality Date   COLONOSCOPY N/A 01/22/2017   Procedure: COLONOSCOPY;  Surgeon: Danie Binder, MD;  [facility];  Service: Endoscopy;  Laterality: N/A;  200   CORONARY ARTERY BYPASS GRAFT  03/18/2010   LIMA-LAD, SVG to diagonal, OM1 & OM2   CORONARY STENT INTERVENTION N/A 08/21/2017   Procedure: CORONARY STENT INTERVENTION;  Surgeon: [name], MD;  Location: [facility];  Service: Cardiovascular;  Laterality: N/A;   LEFT HEART CATH AND CORS/GRAFTS ANGIOGRAPHY N/A 08/21/2017   Procedure: LEFT HEART CATH AND CORS/GRAFTS ANGIOGRAPHY;  Surgeon: [name], MD;  Location: [place] [facility];  Service: Cardiovascular;  Laterality: N/A;   LEFT HEART CATH AND CORS/GRAFTS ANGIOGRAPHY N/A 06/21/2018   Procedure: LEFT HEART CATH AND CORS/GRAFTS ANGIOGRAPHY;  Surgeon: Nelva Bush, MD;  Location: [place] [facility];  Service: Cardiovascular;  Laterality: N/A;   POLYPECTOMY  01/22/2017   Procedure: POLYPECTOMY;  Surgeon: Danie Binder, MD;  [facility];  Service: Endoscopy;;  Transverse(CS) and sigmoid colon(HS)   PTCA  06/1996   LAD & RCA   TEE WITHOUT CARDIOVERSION N/A 06/10/2012   Procedure: TRANSESOPHAGEAL ECHOCARDIOGRAM (TEE);  Surgeon: Josue Hector, MD;  [facility];  Service: Cardiovascular;  Laterality: N/A;    Social History:  reports that he quit smoking about 18 years ago. He has never used smokeless tobacco. He reports current alcohol use. He reports that he does not use drugs.   Allergies  Allergen Reactions   Lisinopril Swelling and Other (See Comments)    Mouth and tongue swells    Family History  Problem Relation Age of Onset   Cancer Mother 61     Prior to Admission medications   Medication Sig Start Date End Date Taking? Authorizing Provider  apixaban (ELIQUIS) 5 MG TABS tablet Take 1 tablet (5 mg total) by mouth 2 (two) times daily. 06/22/18   Minus Breeding, MD  atorvastatin (LIPITOR) 40 MG tablet Take 1 tablet (40 mg total) by mouth daily at 6 PM. 03/09/17   Debbe Odea, MD  clopidogrel (PLAVIX) 75 MG tablet     [provider]  cycloSPORINE (RESTASIS) 0.05 % ophthalmic emulsion Place 1 drop into both eyes daily as needed (dry eye).     [provider]  furosemide (LASIX) 20 MG tablet TAKE 3 TABLETS BY MOUTH EVERY DAY 10/02/20   Fay Records, MD  hydrALAZINE (APRESOLINE) 25  MG tablet Take 1 tablet (25 mg total) by mouth 3 (three) times daily. 09/12/20 12/11/20  Fay Records, MD  HYDROcodone-acetaminophen (NORCO) 5-325 MG tablet Take 1 tablet by mouth every 6 (six) hours as needed for severe pain. 11/06/19   Sherwood Gambler, MD  isosorbide mononitrate (IMDUR) 60 MG 24 hr tablet Take 1 tablet (60 mg total) by mouth daily. Patient not taking: Reported on 09/27/2020 09/12/20 12/11/20  Fay Records, MD  metoprolol succinate (TOPROL-XL) 50 MG 24 hr tablet Take 50 mg by mouth daily. 04/27/17   [provider]  nitroGLYCERIN (NITROSTAT) 0.4 MG SL tablet Place 1 tablet (0.4 mg total) under the tongue every 5 (five) minutes as needed for chest pain. up to 3 doses. 07/05/13   Lendon Colonel, NP  traMADol (ULTRAM) 50 MG tablet Take 50 mg by mouth 3 (three) times daily. 11/29/19   [provider]    Physical Exam: BP (!) 147/78   Pulse (!) 55   Temp 98.1 F (36.7 C)   Resp 12   Ht 5\' 6"  (1.676 m)   Wt 100.4 kg   SpO2 99%   BMI 35.73 kg/m   General: 75 y.o. year-old male well  developed well nourished in no acute distress.  Alert and oriented x3. HEENT: NCAT, EOMI Neck: Supple, trachea medial Cardiovascular: Regular rate and rhythm with no rubs or gallops.  No thyromegaly or JVD noted.  No lower extremity edema. 2/4 pulses in all 4 extremities. Respiratory: Clear to auscultation with no wheezes or rales. Good inspiratory effort. Abdomen: Soft, mild tenderness to palpation of LUQ abdominal area.  Nondistended with normal bowel sounds x4 quadrants. Muskuloskeletal: No cyanosis, clubbing or edema noted bilaterally Neuro: CN II-XII intact, strength 5/5 x 4, sensation, reflexes intact Skin: No ulcerative lesions noted or rashes Psychiatry: Judgement and insight appear normal. Mood is appropriate for condition and setting          Labs on Admission:  Basic Metabolic Panel: Recent Labs  Lab 10/18/20 0156  NA 137  K 3.6  CL 105  CO2 26  GLUCOSE 121*  BUN  13  CREATININE 1.28*  CALCIUM 8.2*   Liver Function Tests: Recent Labs  Lab 10/18/20 0156  AST 38  ALT 56*  ALKPHOS 70  BILITOT 1.6*  PROT 7.1  ALBUMIN 3.9   Recent Labs  Lab 10/18/20 0156  LIPASE 65*   No results for input(s): AMMONIA in the last 168 hours. CBC: Recent Labs  Lab 10/18/20 0156  WBC 6.7  NEUTROABS 2.6  HGB 15.8  HCT 49.9  MCV 92.6  PLT 128*   Cardiac Enzymes: No results for input(s): CKTOTAL, CKMB, CKMBINDEX, TROPONINI in the last 168 hours.  BNP (last 3 results) Recent Labs    09/16/20 0121 10/18/20 0156  BNP 208.0* 187.0*    ProBNP (last 3 results) No results for input(s): PROBNP in the last 8760 hours.  CBG: No results for input(s): GLUCAP in the last 168 hours.  Radiological Exams on Admission: CT Angio Chest Pulmonary Embolism (PE) W or WO Contrast  Result Date: 10/18/2020 CLINICAL DATA:  Left abdominal tightness after caring cases of water, history of pulmonary embolism as well, concern for acute pulmonary embolism. EXAM: CT ANGIOGRAPHY CHEST CT ABDOMEN AND PELVIS WITH CONTRAST TECHNIQUE: Multidetector CT imaging of the chest was performed using the standard protocol during bolus administration of intravenous contrast. Multiplanar CT image reconstructions and MIPs were obtained to evaluate the vascular anatomy. Multidetector CT imaging of the abdomen and pelvis was performed using the standard protocol during bolus administration of intravenous contrast. CONTRAST:  124mL OMNIPAQUE IOHEXOL 350 MG/ML SOLN COMPARISON:  CT abdomen pelvis 09/16/2020, radiograph 09/16/2020, 10/18/2020, CT angiography of the chest 03/30/2020 FINDINGS: CTA CHEST FINDINGS Cardiovascular: Satisfactory opacification of the pulmonary arteries. Peripherally marginated filling defects with some luminal narrowing or noted involving branches of the right lower lobe superior segmental pulmonary artery and lateral segmental pulmonary artery of the right middle lobe (5/191) some  additional web-like filling defects versus suboptimal opacification seen involving the posterior lateral basal segments of the right lower lobe as well. No other discrete pulmonary arterial filling defects are identified. Central pulmonary artery enlargement is noted. Background of cardiomegaly and biatrial enlargement as well as evidence of prior sternotomy and CABG. No pericardial effusion. Slight reflux of contrast into the hepatic veins and IVC. Suboptimal opacification of the thoracic aorta for luminal assessment. Atherosclerotic plaque within the normal caliber aorta. Gross acute luminal abnormality is discernible. No periaortic stranding or hemorrhage. Tortuosity of the otherwise normally branching great vessels. Mediastinum/Nodes: Scattered low-attenuation subcentimeter mediastinal and hilar nodes are favored to be reactive. No worrisome axillary adenopathy. Thyroid gland is unremarkable. No acute abnormality of the trachea  or esophagus. Postsurgical changes in the anterior mediastinum without free mediastinal air or fluid. Lungs/Pleura: Extensive centrilobular and paraseptal emphysematous changes throughout the lungs. Diffuse airways thickening and scattered secretions likely reflecting some chronic bronchitic features as well given similarity of appearance in the lung bases on comparison imaging. Musculoskeletal: Prior sternotomy. Multilevel degenerative changes are present in the imaged portions of the spine. No acute osseous abnormality or suspicious osseous lesion. No worrisome chest wall mass or lesion. Review of the MIP images confirms the above findings. CT ABDOMEN and PELVIS FINDINGS Hepatobiliary: Diffuse hepatic hypoattenuation compatible with hepatic steatosis. No concerning focal liver lesion. Smooth liver surface contour. Normal gallbladder and biliary tree without visible calcified gallstone. Pancreas: No pancreatic ductal dilatation or surrounding inflammatory changes. Spleen: Stable lobular  contours of the spleen, otherwise normal in size without concerning focal lesion or acute abnormality. Adrenals/Urinary Tract: Normal adrenal glands. Kidneys enhance and excrete symmetrically and uniformly. Mild bilateral perinephric stranding is unchanged from comparison with areas of cortical scarring bilaterally. Few scattered subcentimeter hypoattenuating foci are present in both kidneys too small to fully characterize on CT imaging but statistically likely benign. No concerning focal renal lesion. No urolithiasis or hydronephrosis. Circumferential bladder wall thickening, greater than expected for mere underdistention with some indentation of bladder base by the median lobe hypertrophy of the prostate. Stomach/Bowel: Distal esophagus, stomach and duodenum are unremarkable. No small bowel thickening or dilatation is seen. Normal appendix in the right lower quadrant in a retrocecal position. No colonic dilatation or wall thickening. No evidence of bowel obstruction. Vascular/Lymphatic: Atherosclerotic calcifications throughout the abdominal aorta and branch vessels including plaque at the splanchnic and renal artery ostia, incompletely assessed on this non angiographic technique. No aneurysm or ectasia. No suspicious or enlarged lymph nodes in the included lymphatic chains. Reproductive: Borderline prostatomegaly with median lobe hypertrophy. Seminal vesicles are unremarkable. Other: Mild body wall edema. No abdominopelvic free air or fluid. No bowel containing hernias. Musculoskeletal: Multilevel discogenic and facet degenerative changes present throughout the lumbar levels. Additional degenerative changes and enthesopathy about the hips pelvis. Partial fusion of the SI joints. No acute osseous abnormality or suspicious osseous lesion. Musculature is normal and symmetric. Review of the MIP images confirms the above findings. IMPRESSION: CT angiography chest: 1. Age indeterminate pulmonary artery emboli involving  the segmental branches of the right middle and lower lobe in a region of prior pulmonary embolic disease. Some these filling defects demonstrate a peripheral and web like configuration as well as narrowing of the pulmonary arteries suggests a chronic process while others result in more complete opacification of the pulmonary artery. Acute chronic embolic phenomenon may be present. 2. Background of cardiomegaly limits assessment of right heart strain on CT imaging. Secondary features including pulmonary artery enlargement and reflux of contrast into the hepatic veins certainly suggest elevated right heart pressures of these may be related to underlying parenchymal disease given extensive emphysematous changes and bronchitic features. 3. No other acute intrathoracic process. 4. Prior sternotomy and CABG. CT abdomen pelvis: 1. Persistent circumferential bladder wall thickening, possibly related to bladder outlet obstruction given a borderline enlarged prostate with median lobe hypertrophy. Correlate for clinical features of outlet obstruction and consider urinalysis to exclude a concomitant cystitis. 2. Chronic mild nonspecific perinephric stranding, favored to reflect diminished renal function or advanced age given additional features of bilateral renal cortical scarring. 3. Hepatic steatosis. 4.  Aortic Atherosclerosis (ICD10-I70.0). These results were called by telephone at the time of interpretation on 10/18/2020 at 4:03 am to  provider Joseph Berkshire , who verbally acknowledged these results. Electronically Signed   By: Lovena Le M.D.   On: 10/18/2020 04:04   CT ABDOMEN PELVIS W CONTRAST  Result Date: 10/18/2020 CLINICAL DATA:  Left abdominal tightness after caring cases of water, history of pulmonary embolism as well, concern for acute pulmonary embolism. EXAM: CT ANGIOGRAPHY CHEST CT ABDOMEN AND PELVIS WITH CONTRAST TECHNIQUE: Multidetector CT imaging of the chest was performed using the standard  protocol during bolus administration of intravenous contrast. Multiplanar CT image reconstructions and MIPs were obtained to evaluate the vascular anatomy. Multidetector CT imaging of the abdomen and pelvis was performed using the standard protocol during bolus administration of intravenous contrast. CONTRAST:  159mL OMNIPAQUE IOHEXOL 350 MG/ML SOLN COMPARISON:  CT abdomen pelvis 09/16/2020, radiograph 09/16/2020, 10/18/2020, CT angiography of the chest 03/30/2020 FINDINGS: CTA CHEST FINDINGS Cardiovascular: Satisfactory opacification of the pulmonary arteries. Peripherally marginated filling defects with some luminal narrowing or noted involving branches of the right lower lobe superior segmental pulmonary artery and lateral segmental pulmonary artery of the right middle lobe (5/191) some additional web-like filling defects versus suboptimal opacification seen involving the posterior lateral basal segments of the right lower lobe as well. No other discrete pulmonary arterial filling defects are identified. Central pulmonary artery enlargement is noted. Background of cardiomegaly and biatrial enlargement as well as evidence of prior sternotomy and CABG. No pericardial effusion. Slight reflux of contrast into the hepatic veins and IVC. Suboptimal opacification of the thoracic aorta for luminal assessment. Atherosclerotic plaque within the normal caliber aorta. Gross acute luminal abnormality is discernible. No periaortic stranding or hemorrhage. Tortuosity of the otherwise normally branching great vessels. Mediastinum/Nodes: Scattered low-attenuation subcentimeter mediastinal and hilar nodes are favored to be reactive. No worrisome axillary adenopathy. Thyroid gland is unremarkable. No acute abnormality of the trachea or esophagus. Postsurgical changes in the anterior mediastinum without free mediastinal air or fluid. Lungs/Pleura: Extensive centrilobular and paraseptal emphysematous changes throughout the lungs.  Diffuse airways thickening and scattered secretions likely reflecting some chronic bronchitic features as well given similarity of appearance in the lung bases on comparison imaging. Musculoskeletal: Prior sternotomy. Multilevel degenerative changes are present in the imaged portions of the spine. No acute osseous abnormality or suspicious osseous lesion. No worrisome chest wall mass or lesion. Review of the MIP images confirms the above findings. CT ABDOMEN and PELVIS FINDINGS Hepatobiliary: Diffuse hepatic hypoattenuation compatible with hepatic steatosis. No concerning focal liver lesion. Smooth liver surface contour. Normal gallbladder and biliary tree without visible calcified gallstone. Pancreas: No pancreatic ductal dilatation or surrounding inflammatory changes. Spleen: Stable lobular contours of the spleen, otherwise normal in size without concerning focal lesion or acute abnormality. Adrenals/Urinary Tract: Normal adrenal glands. Kidneys enhance and excrete symmetrically and uniformly. Mild bilateral perinephric stranding is unchanged from comparison with areas of cortical scarring bilaterally. Few scattered subcentimeter hypoattenuating foci are present in both kidneys too small to fully characterize on CT imaging but statistically likely benign. No concerning focal renal lesion. No urolithiasis or hydronephrosis. Circumferential bladder wall thickening, greater than expected for mere underdistention with some indentation of bladder base by the median lobe hypertrophy of the prostate. Stomach/Bowel: Distal esophagus, stomach and duodenum are unremarkable. No small bowel thickening or dilatation is seen. Normal appendix in the right lower quadrant in a retrocecal position. No colonic dilatation or wall thickening. No evidence of bowel obstruction. Vascular/Lymphatic: Atherosclerotic calcifications throughout the abdominal aorta and branch vessels including plaque at the splanchnic and renal artery ostia,  incompletely assessed  on this non angiographic technique. No aneurysm or ectasia. No suspicious or enlarged lymph nodes in the included lymphatic chains. Reproductive: Borderline prostatomegaly with median lobe hypertrophy. Seminal vesicles are unremarkable. Other: Mild body wall edema. No abdominopelvic free air or fluid. No bowel containing hernias. Musculoskeletal: Multilevel discogenic and facet degenerative changes present throughout the lumbar levels. Additional degenerative changes and enthesopathy about the hips pelvis. Partial fusion of the SI joints. No acute osseous abnormality or suspicious osseous lesion. Musculature is normal and symmetric. Review of the MIP images confirms the above findings. IMPRESSION: CT angiography chest: 1. Age indeterminate pulmonary artery emboli involving the segmental branches of the right middle and lower lobe in a region of prior pulmonary embolic disease. Some these filling defects demonstrate a peripheral and web like configuration as well as narrowing of the pulmonary arteries suggests a chronic process while others result in more complete opacification of the pulmonary artery. Acute chronic embolic phenomenon may be present. 2. Background of cardiomegaly limits assessment of right heart strain on CT imaging. Secondary features including pulmonary artery enlargement and reflux of contrast into the hepatic veins certainly suggest elevated right heart pressures of these may be related to underlying parenchymal disease given extensive emphysematous changes and bronchitic features. 3. No other acute intrathoracic process. 4. Prior sternotomy and CABG. CT abdomen pelvis: 1. Persistent circumferential bladder wall thickening, possibly related to bladder outlet obstruction given a borderline enlarged prostate with median lobe hypertrophy. Correlate for clinical features of outlet obstruction and consider urinalysis to exclude a concomitant cystitis. 2. Chronic mild nonspecific  perinephric stranding, favored to reflect diminished renal function or advanced age given additional features of bilateral renal cortical scarring. 3. Hepatic steatosis. 4.  Aortic Atherosclerosis (ICD10-I70.0). These results were called by telephone at the time of interpretation on 10/18/2020 at 4:03 am to provider [facility] , who verbally acknowledged these results. Electronically Signed   By: Lovena Le M.D.   On: 10/18/2020 04:04   DG Abdomen Acute W/Chest  Result Date: 10/18/2020 CLINICAL DATA:  75 year old male with abdominal pain. EXAM: DG ABDOMEN ACUTE WITH 1 VIEW CHEST COMPARISON:  CT abdomen pelvis dated 09/16/2020. FINDINGS: There is cardiomegaly with mild vascular congestion. No focal consolidation, pleural effusion, or pneumothorax. Median sternotomy wires and CABG vascular clips. No bowel dilatation or evidence of obstruction. No free air or radiopaque calculi. Degenerative changes of the spine. The osseous structures are intact. IMPRESSION: 1. Cardiomegaly with mild vascular congestion. 2. No bowel obstruction. Electronically Signed   By: Anner Crete M.D.   On: 10/18/2020 03:00    EKG: I independently viewed the EKG done and my findings are as followed: Normal sinus rhythm at a rate of 64 bpm, RBBB and TWI in V4-V6.  Assessment/Plan Present on Admission:  Pulmonary embolism (HCC)  Cerebral thrombosis with cerebral infarction  Hyperlipidemia  OSA (obstructive sleep apnea)  Active Problems:   Hyperlipidemia   CAD (coronary artery disease)   Pulmonary embolism (HCC)   Central sleep apnea   OSA (obstructive sleep apnea)   Cerebral thrombosis with cerebral infarction   CHF (congestive heart failure) (HCC)   Thrombocytopenia (HCC)   Elevated brain natriuretic peptide (BNP) level   Elevated lipase   Elevated alanine aminotransferase (ALT) level  Presumed acute on chronic PE CT angiography of chest suggests acute on chronic emboli Patient was started on IV heparin  drip Pulmonology consulted and agreed with IV heparin drip, hematology consult was also suggested considering the patient develops embolus despite being on  Eliquis per ED physician  Abdominal wall pain Continue Tylenol as needed  Elevated BNP (chronic) BNP 187, this was 208 about 1 month ago  Thrombocytopenia possibly reactive Platelets 128, continue to monitor platelet level with morning labs  Elevated lipase level/elevated ALT Lipase 65, ALT 56.  Patient's abdominal pain does not appear to be due to pancreatitis Continue to monitor liver enzymes.  History of CHF Last echo in March 2020 showed LVEF of 35-40% Continue home meds  CAD Stable no chest pain Continue Imdur, nitroglycerin Continue beta-blocker for secondary prevention.   History of OSA on CPAP at night Patient and granddaughter at bedside denies patient's history of OSA and that he does not use any CPAP at night   Hyperlipidemia Statin is temporarily held due to mildly elevated ALT   History of cerebral thrombosis Patient is currently on IV heparin drip   DVT prophylaxis: Heparin drip  Code Status: Full code  Family Communication: Granddaughter at bedside (all questions answered to satisfaction)  Disposition Plan:  Patient is from:                        home Anticipated DC to:                   SNF or family members home Anticipated DC date:               2-3 days Anticipated DC barriers:          Patient requires inpatient management due to 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

## 2020-10-18 NOTE — Progress Notes (Signed)
Patient refused to wear SCDs due to being able to ambulate in room. Educated on the importance of SCDs. MD Choi aware.

## 2020-10-18 NOTE — Progress Notes (Signed)
ANTICOAGULATION CONSULT NOTE - Initial Consult  Pharmacy Consult for heparin Indication: pulmonary embolus  Allergies  Allergen Reactions   Lisinopril Swelling and Other (See Comments)    Mouth and tongue swells    Patient Measurements: Height: 5\' 6"  (167.6 cm) Weight: 100.4 kg (221 lb 6.4 oz) IBW/kg (Calculated) : 63.8 Heparin Dosing Weight: 85kg  Vital Signs: Temp: 98.1 F (36.7 C) (07/14 0139) BP: 138/67 (07/14 0400) Pulse Rate: 57 (07/14 0400)  Labs: Recent Labs    10/18/20 0156  HGB 15.8  HCT 49.9  PLT 128*  CREATININE 1.28*  TROPONINIHS 10    Estimated Creatinine Clearance: 56.1 mL/min (A) (by C-G formula based on SCr of 1.28 mg/dL (H)).   Medical History: Past Medical History:  Diagnosis Date   Apical mural thrombus    Arteriosclerotic cardiovascular disease (ASCVD) 1998   Inferior MI->PCI of the RCA in 1998; acute anterior MI in 06/2002->  PCI of RCA and LAD with DES x2 to RCA, residual 80% ostial D1, and 80% mid CX and EF-40%; CABG-2011, LIMA-LAD, SVG to D1, OM1 & OM2; EF of 35-40% in 07/2010   Chronic anticoagulation    Chronic systolic CHF (congestive heart failure) (HCC)    CVA (cerebral vascular accident) ([place])    HTN (hypertension)    Hyperlipidemia    Keloid    median sternotomy   Pulmonary embolism ([place]) 03/2010   Substance abuse ([place])    formerly cocaine    Assessment: 75yo male c/o upper abdominal pain after lifting a heavy object, CT reveals chronic PE but cannot exclude acute PE, to transition from Eliquis to UFH; pt reports that his last dose of Eliquis was 7/13 1900 and does not recall missing any doses.  Goal of Therapy:  Heparin level 0.3-0.7 units/ml aPTT 66-102 seconds Monitor platelets by anticoagulation protocol: Yes   Plan:  Heparin 3000 units IV bolus x1. Heparin infusion at 1400 units/hr. Monitor heparin levels, aPTT (while DOAC affects anti-Xa), and CBC.  Wynona Neat, PharmD, BCPS  10/18/2020,4:37 AM

## 2020-10-18 NOTE — Progress Notes (Signed)
*  PRELIMINARY RESULTS* Echocardiogram 2D Echocardiogram has been performed with Definity.  Samuel Germany 10/18/2020, 4:36 PM

## 2020-10-18 NOTE — Progress Notes (Signed)
ANTICOAGULATION CONSULT NOTE -   Pharmacy Consult for heparin Indication: pulmonary embolus  Allergies  Allergen Reactions   Lisinopril Swelling and Other (See Comments)    Mouth and tongue swells    Patient Measurements: Height: 5\' 6"  (167.6 cm) Weight: 101.7 kg (224 lb 4.8 oz) IBW/kg (Calculated) : 63.8 Heparin Dosing Weight: 85kg  Vital Signs: Temp: 97.7 F (36.5 C) (07/14 0741) Temp Source: Oral (07/14 0741) BP: 131/71 (07/14 1203) Pulse Rate: 62 (07/14 1203)  Labs: Recent Labs    10/18/20 0156 10/18/20 0409 10/18/20 1310  HGB 15.8  --   --   HCT 49.9  --   --   PLT 128*  --   --   APTT  --   --  146*  HEPARINUNFRC  --   --  0.99*  CREATININE 1.28*  --   --   TROPONINIHS 10 9  --      Estimated Creatinine Clearance: 56.6 mL/min (A) (by C-G formula based on SCr of 1.28 mg/dL (H)).   Medical History: Past Medical History:  Diagnosis Date   Apical mural thrombus    Arteriosclerotic cardiovascular disease (ASCVD) 1998   Inferior MI->PCI of the RCA in 1998; acute anterior MI in 06/2002->  PCI of RCA and LAD with DES x2 to RCA, residual 80% ostial D1, and 80% mid CX and EF-40%; CABG-2011, LIMA-LAD, SVG to D1, OM1 & OM2; EF of 35-40% in 07/2010   Chronic anticoagulation    Chronic systolic CHF (congestive heart failure) (HCC)    CVA (cerebral vascular accident) ([place])    HTN (hypertension)    Hyperlipidemia    Keloid    median sternotomy   Pulmonary embolism ([place]) 03/2010   Substance abuse ([place])    formerly cocaine    Assessment: 75yo male c/o upper abdominal pain after lifting a heavy object, CT reveals chronic PE but cannot exclude acute PE, to transition from Eliquis to UFH; pt reports that his last dose of Eliquis was 7/13 1900 and does not recall missing any doses.  HL 0.99 APTT 146- supratherapeutic  Goal of Therapy:  Heparin level 0.3-0.7 units/ml aPTT 66-102 seconds Monitor platelets by anticoagulation protocol: Yes   Plan:  Hold heparin for  1 hour Decrease heparin infusion to 1150 units/hr Monitor APTT in 8 hours and APTT/HL daily Continue to monitor H&H and s/s of bleeding.  Margot Ables, PharmD Clinical Pharmacist 10/18/2020 2:52 PM

## 2020-10-18 NOTE — ED Triage Notes (Signed)
Pt c/o left sided abd tightness after carrying a case of waters this morning.

## 2020-10-19 DIAGNOSIS — I2699 Other pulmonary embolism without acute cor pulmonale: Secondary | ICD-10-CM | POA: Diagnosis not present

## 2020-10-19 DIAGNOSIS — I2694 Multiple subsegmental pulmonary emboli without acute cor pulmonale: Secondary | ICD-10-CM | POA: Diagnosis not present

## 2020-10-19 LAB — LIPASE, BLOOD: Lipase: 65 U/L — ABNORMAL HIGH (ref 11–51)

## 2020-10-19 LAB — COMPREHENSIVE METABOLIC PANEL
ALT: 43 U/L (ref 0–44)
AST: 25 U/L (ref 15–41)
Albumin: 3.5 g/dL (ref 3.5–5.0)
Alkaline Phosphatase: 54 U/L (ref 38–126)
Anion gap: 6 (ref 5–15)
BUN: 12 mg/dL (ref 8–23)
CO2: 28 mmol/L (ref 22–32)
Calcium: 8.6 mg/dL — ABNORMAL LOW (ref 8.9–10.3)
Chloride: 104 mmol/L (ref 98–111)
Creatinine, Ser: 1.29 mg/dL — ABNORMAL HIGH (ref 0.61–1.24)
GFR, Estimated: 58 mL/min — ABNORMAL LOW (ref >60)
Glucose, Bld: 104 mg/dL — ABNORMAL HIGH (ref 70–99)
Potassium: 4.3 mmol/L (ref 3.5–5.1)
Sodium: 138 mmol/L (ref 135–145)
Total Bilirubin: 2.2 mg/dL — ABNORMAL HIGH (ref 0.3–1.2)
Total Protein: 6.4 g/dL — ABNORMAL LOW (ref 6.5–8.1)

## 2020-10-19 LAB — CBC
HCT: 48.4 % (ref 39.0–52.0)
Hemoglobin: 15 g/dL (ref 13.0–17.0)
MCH: 28.7 pg (ref 26.0–34.0)
MCHC: 31 g/dL (ref 30.0–36.0)
MCV: 92.5 fL (ref 80.0–100.0)
Platelets: 112 10*3/uL — ABNORMAL LOW (ref 150–400)
RBC: 5.23 MIL/uL (ref 4.22–5.81)
RDW: 13.2 % (ref 11.5–15.5)
WBC: 6 10*3/uL (ref 4.0–10.5)
nRBC: 0 % (ref 0.0–0.2)

## 2020-10-19 LAB — APTT
aPTT: 72 seconds — ABNORMAL HIGH (ref 24–36)
aPTT: 87 seconds — ABNORMAL HIGH (ref 24–36)

## 2020-10-19 LAB — PROTIME-INR
INR: 1.2 (ref 0.8–1.2)
Prothrombin Time: 15.4 seconds — ABNORMAL HIGH (ref 11.4–15.2)

## 2020-10-19 LAB — HEPARIN LEVEL (UNFRACTIONATED)
Heparin Unfractionated: 0.46 IU/mL (ref 0.30–0.70)
Heparin Unfractionated: 0.1 IU/mL — ABNORMAL LOW (ref 0.30–0.70)

## 2020-10-19 MED ORDER — ENOXAPARIN SODIUM 100 MG/ML IJ SOSY
100.0000 mg | PREFILLED_SYRINGE | Freq: Two times a day (BID) | INTRAMUSCULAR | Status: DC
  Administered 2020-10-19: 100 mg via SUBCUTANEOUS
  Filled 2020-10-19: qty 1

## 2020-10-19 MED ORDER — ENOXAPARIN SODIUM 100 MG/ML IJ SOSY: 100.0000 mg | PREFILLED_SYRINGE | Freq: Two times a day (BID) | INTRAMUSCULAR | 0 refills | Status: DC

## 2020-10-19 MED ORDER — ENOXAPARIN (LOVENOX) PATIENT EDUCATION KIT
PACK | Freq: Once | Status: AC
  Filled 2020-10-19: qty 1

## 2020-10-19 MED ORDER — WARFARIN - PHARMACIST DOSING INPATIENT: Freq: Every day | Status: DC

## 2020-10-19 MED ORDER — WARFARIN SODIUM 5 MG PO TABS: 10.0000 mg | ORAL_TABLET | Freq: Once | ORAL | Status: DC

## 2020-10-19 MED ORDER — WARFARIN SODIUM 5 MG PO TABS: 5.0000 mg | ORAL_TABLET | Freq: Every day | ORAL | 0 refills | Status: DC

## 2020-10-19 NOTE — Progress Notes (Signed)
ANTICOAGULATION CONSULT NOTE - Initial Up Consult   Pharmacy Consult for warfarin AND enoxaparin dosing  Indication: VTE treatment    Allergies  Allergen Reactions   Lisinopril Swelling and Other (See Comments)    Mouth and tongue swells      Patient Measurements: Last Weight  Most recent update: 10/18/2020  3:01 PM    Weight  100.6 kg (221 lb 12.5 oz)            Body mass index is 35.8 kg/m. [name]               Temp: 98.5 F (36.9 C) (07/15 0444) BP: 115/67 (07/15 0444) Pulse Rate: 53 (07/15 0444)  Labs: Recent Labs    10/18/20 0156 10/18/20 1310 10/18/20 2257 10/19/20 0614  HGB 15.8  --   --  15.0  HCT 49.9  --   --  48.4  PLT 128*  --   --  112*  APTT  --  146* 72* 87*  LABPROT  --   --   --  15.4*  INR  --   --   --  1.2  HEPARINUNFRC  --  0.99*  --  0.46  CREATININE 1.28*  --   --  1.29*    Estimated Creatinine Clearance: 55.8 mL/min (A) (by C-G formula based on SCr of 1.29 mg/dL (H)).     Medications:  Medications Prior to Admission  Medication Sig Dispense Refill Last Dose   apixaban (ELIQUIS) 5 MG TABS tablet Take 1 tablet (5 mg total) by mouth 2 (two) times daily. 60 tablet 3 10/17/2020 at 1900   clopidogrel (PLAVIX) 75 MG tablet Take 75 mg by mouth daily.   10/17/2020   atorvastatin (LIPITOR) 40 MG tablet Take 1 tablet (40 mg total) by mouth daily at 6 PM. 30 tablet 0    cycloSPORINE (RESTASIS) 0.05 % ophthalmic emulsion Place 1 drop into both eyes daily as needed (dry eye).       furosemide (LASIX) 20 MG tablet TAKE 3 TABLETS BY MOUTH EVERY DAY 270 tablet 3    hydrALAZINE (APRESOLINE) 25 MG tablet Take 1 tablet (25 mg total) by mouth 3 (three) times daily. 270 tablet 3    HYDROcodone-acetaminophen (NORCO) 5-325 MG tablet Take 1 tablet by mouth every 6 (six) hours as needed for severe pain. 10 tablet 0    isosorbide mononitrate (IMDUR) 60 MG 24 hr tablet Take 1 tablet (60 mg total) by mouth daily. (Patient not taking: Reported on  09/27/2020) 90 tablet 3    metoprolol succinate (TOPROL-XL) 50 MG 24 hr tablet Take 50 mg by mouth daily.  3    nitroGLYCERIN (NITROSTAT) 0.4 MG SL tablet Place 1 tablet (0.4 mg total) under the tongue every 5 (five) minutes as needed for chest pain. up to 3 doses. 25 tablet 4 PRN   traMADol (ULTRAM) 50 MG tablet Take 50 mg by mouth 3 (three) times daily.      Scheduled:   atorvastatin  40 mg Oral q1800   clopidogrel  75 mg Oral Daily   enoxaparin (LOVENOX) injection  100 mg Subcutaneous Q12H   hydrALAZINE  25 mg Oral TID   isosorbide mononitrate  60 mg Oral Daily   metoprolol succinate  50 mg Oral Daily   traMADol  50 mg Oral TID   warfarin  10 mg Oral ONCE-1600   Warfarin - Pharmacist Dosing Inpatient   Does not apply q1600   Infusions:  PRN: acetaminophen, cycloSPORINE,  HYDROcodone-acetaminophen, nitroGLYCERIN Anti-infectives (From admission, onward)    None       Goal of Therapy:  INR 2-3 Monitor platelets by anticoagulation protocol: Yes    Assessment: [name] a 75 y.o. male requires anticoagulation with warfarin for the indication of  DVT. Warfarin will be initiated inpatient following pharmacy protocol per pharmacy consult. Patient most recent blood work is as follows: CBC Latest Ref Rng & Units 10/19/2020 10/18/2020 09/16/2020  WBC 4.0 - 10.5 K/uL 6.0 6.7 5.4  Hemoglobin 13.0 - 17.0 g/dL 15.0 15.8 15.8  Hematocrit 39.0 - 52.0 % 48.4 49.9 48.7  Platelets 150 - 400 K/uL 112(L) 128(L) 132(L)     Plan: Stop heparin gtt Start enoxaparin 100mg  subq q12h  Warfarin 10mg  po x 1 dose tonight Monitor CBC daily with am labs   Monitor INR daily Monitor for signs and symptoms of bleeding   Donna Christen Rithwik Schmieg, PharmD, MBA, BCGP Clinical Pharmacist

## 2020-10-19 NOTE — Care Management Obs Status (Signed)
[facility] NOTIFICATION   Patient Details  Name: [name] MRN: [id number] Date of Birth: December 31, 1945   Medicare Observation Status Notification Given:  Yes    Tommy Medal 10/19/2020, 12:08 PM

## 2020-10-19 NOTE — Progress Notes (Signed)
ANTICOAGULATION CONSULT NOTE - Follow Up Consult  Pharmacy Consult for heparin Indication: pulmonary embolus  Labs: Recent Labs    10/18/20 0156 10/18/20 0409 10/18/20 1310 10/18/20 2257  HGB 15.8  --   --   --   HCT 49.9  --   --   --   PLT 128*  --   --   --   APTT  --   --  146* 72*  HEPARINUNFRC  --   --  0.99*  --   CREATININE 1.28*  --   --   --   TROPONINIHS 10 9  --   --     Assessment/Plan:  75yo male therapeutic on heparin after rate change. Will continue gtt at current rate of 1150 units/hr and confirm stable with am labs.   Wynona Neat, PharmD, BCPS  10/19/2020,12:25 AM

## 2020-10-19 NOTE — TOC Transition Note (Signed)
Transition of Care [facility]) - CM/SW Discharge Note   Patient Details  Name: [name] MRN: [id number] Date of Birth: 1946/01/07  [facility] CM/SW Contact:  Iona Beard, [place] Phone Number: 10/19/2020, 10:43 AM   Clinical Narrative:    CSW informed by Attending that pt would need follow up appointment with [facility] for INR check. CSW set pt up with an appointment on Tuesday 7/19 at [place] in the [place] office as the nurse will not come to the [place] office until August. CSW updated pt of this appointment. CSW added this to AVS for pt. TOC signing off.   Final next level of care: Home/Self Care Barriers to Discharge: Continued Medical Work up   Patient Goals and CMS Choice Patient states their goals for this hospitalization and ongoing recovery are:: return home CMS Medicare.gov Compare Post Acute Care list provided to:: Patient Choice offered to / list presented to : Patient  Discharge Placement                       Discharge Plan and Services                                     Social Determinants of Health (SDOH) Interventions     Readmission Risk Interventions No flowsheet data found.

## 2020-10-19 NOTE — Discharge Summary (Signed)
Physician Discharge Summary  [name]:[id number] DOB: 1946/03/12 DOA: 10/18/2020  PCP: Rosita Fire, MD  Admit date: 10/18/2020 Discharge date: 10/19/2020  Admitted From: Home Disposition:  Home  Recommendations for Outpatient Follow-up:  Follow up with PCP in 1 week Follow up with INR check as scheduled on Tuesday Referral for outpatient hematology appointment ordered   Discharge Condition: Stable CODE STATUS: Full  Diet recommendation: Heart healthy   Brief/Interim Summary: From H&P by Dr. Josephine Cables: "[name] is a 75 y.o. 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  This was thought to be related to lifting a 24 pack of water.  Abdominal pain was nonreproducible and was nonradiating.  No known alleviating/aggravating factor.  He denies chest pain, fever, chills.  Patient decided to go to the ED for further evaluation and management   ED Course:  In the emergency department, he was bradycardic, BP was 171/79, other vital signs were within normal range.  Work-up in the ED showed normal CBC except for thrombocytopenia.  BUN to creatinine 13/1.28 (creatinine is within baseline range).  BNP 187, lipase 65, ALT 56.  Influenza A, B, SARS coronavirus 2 was negative. CT angiography of chest showed age indeterminate pulmonary artery emboli involving the segmental branches of the right middle and lower lobe in a region of prior pulmonary embolic disease.  Some of this filling defects suggests a chronic process and acute and chronic embolic phenomena may be present.  Abdominal x-ray with 1 view chest showed cardiomegaly with mild vascular congestion and no bowel obstruction. Patient was started on IV heparin  drip, pulmonology consulted agreed with IV heparin drip and recommended phone consult with hematologist in the morning per ED physician.  Hospitalist was asked to admit patient for further evaluation and management."    He and daughter admitted to compliance with his 5 mg twice daily Eliquis dosing without missing any doses. Patient was managed with IV heparin.  Case was discussed with hematology on-call, who recommended switching to Coumadin/Lovenox.  From chart review that patient suffered a provoked PE postop from CABG in 2011.  At that time, patient was managed on Coumadin.  He was eventually stopped on anticoagulation therapy.  Then in 2018, he suffered a stroke, and at that time he was started on Eliquis and Plavix.  Coumadin/lovenox tx was discussed with daughter. He will have INR checked early next week.   On day of discharge, patient remained stable on room air without any complaints of chest pain or abdominal pain.  No nausea or vomiting.  Tolerated breakfast without any issues.   Discharge Diagnoses:  Principal Problem:   Pulmonary embolism (HCC) Active Problems:   Hyperlipidemia   CAD (coronary artery disease)   Central sleep apnea   OSA (obstructive sleep apnea)   Cerebral thrombosis with cerebral infarction   Chronic systolic heart failure (HCC)   Hx of CABG   Ischemic cardiomyopathy   History of CVA (cerebrovascular accident)   Thrombocytopenia (HCC)   Elevated lipase   Elevated alanine aminotransferase (ALT) level    Discharge Instructions  Discharge Instructions     (HEART FAILURE PATIENTS) Call MD:  Anytime you have any of the following symptoms: 1) 3 pound weight gain in 24 hours or 5 pounds  in 1 week 2) shortness of breath, with or without a dry hacking cough 3) swelling in the hands, feet or stomach 4) if you have to sleep on extra pillows at night in order to breathe.   Complete by: As directed    Ambulatory referral to Hematology / Oncology   Complete by: As  directed    Call MD for:  difficulty breathing, headache or visual disturbances   Complete by: As directed    Call MD for:  extreme fatigue   Complete by: As directed    Call MD for:  persistant dizziness or light-headedness   Complete by: As directed    Call MD for:  persistant nausea and vomiting   Complete by: As directed    Call MD for:  severe uncontrolled pain   Complete by: As directed    Call MD for:  temperature >100.4   Complete by: As directed    Diet - low sodium heart healthy   Complete by: As directed    Discharge instructions   Complete by: As directed    You were cared for by a hospitalist during your hospital stay. If you have any questions about your discharge medications or the care you received while you were in the hospital after you are discharged, you can call the unit and ask to speak with the hospitalist on call if the hospitalist that took care of you is not available. Once you are discharged, your primary care physician will handle any further medical issues. Please note that NO REFILLS for any discharge medications will be authorized once you are discharged, as it is imperative that you return to your primary care physician (or establish a relationship with a primary care physician if you do not have one) for your aftercare needs so that they can reassess your need for medications and monitor your lab values.   Increase activity slowly   Complete by: As directed       Allergies as of 10/19/2020       Reactions   Lisinopril Swelling, Other (See Comments)   Mouth and tongue swells        Medication List     STOP taking these medications    apixaban 5 MG Tabs tablet Commonly known as: Eliquis       TAKE these medications    atorvastatin 40 MG tablet Commonly known as: LIPITOR Take 1 tablet (40 mg total) by mouth daily at 6 PM.   clopidogrel 75 MG tablet Commonly known as: PLAVIX Take 75 mg by mouth daily.   cycloSPORINE 0.05 % ophthalmic  emulsion Commonly known as: RESTASIS Place 1 drop into both eyes daily as needed (dry eye).   enoxaparin 100 MG/ML injection Commonly known as: LOVENOX Inject 1 mL (100 mg total) into the skin every 12 (twelve) hours for 7 days.   furosemide 20 MG tablet Commonly known as: LASIX TAKE 3 TABLETS BY MOUTH EVERY DAY   hydrALAZINE 25 MG tablet Commonly known as: APRESOLINE Take 1 tablet (25 mg total) by mouth 3 (three) times daily.   HYDROcodone-acetaminophen 5-325 MG tablet Commonly known as: Norco Take 1 tablet by mouth every 6 (six) hours as needed for severe pain.   isosorbide mononitrate 60 MG 24 hr tablet Commonly known as: IMDUR Take 1 tablet (60 mg total) by mouth daily.   metoprolol succinate 50 MG 24 hr tablet Commonly known as: TOPROL-XL Take 50 mg by mouth daily.   nitroGLYCERIN 0.4 MG SL tablet  Commonly known as: NITROSTAT Place 1 tablet (0.4 mg total) under the tongue every 5 (five) minutes as needed for chest pain. up to 3 doses.   traMADol 50 MG tablet Commonly known as: ULTRAM Take 50 mg by mouth 3 (three) times daily.   warfarin 5 MG tablet Commonly known as: COUMADIN Take 1 tablet (5 mg total) by mouth daily at 4 PM.        Follow-up Information     Physicians, [facility]. Go on 10/23/2020.   Why: Appointment will be at 10 A.M. on Tuesday 10/23/2020 Contact information: [place] [address] [phone]         Rosita Fire, MD. Schedule an appointment as soon as possible for a visit in 1 week(s).   Specialty: Internal Medicine Contact information: [place] [address] [phone]         Derek Jack, MD. Call in 1 week(s).   Specialty: Hematology Contact information: [place] [address] [phone]                Allergies  Allergen Reactions   Lisinopril Swelling and Other (See Comments)    Mouth and tongue swells     Consultations: Hematology over the phone    Procedures/Studies: CT Angio Chest Pulmonary Embolism (PE) W or WO Contrast  Result Date: 10/18/2020 CLINICAL DATA:  Left abdominal tightness after caring cases of water, history of pulmonary embolism as well, concern for acute pulmonary embolism. EXAM: CT ANGIOGRAPHY CHEST CT ABDOMEN AND PELVIS WITH CONTRAST TECHNIQUE: Multidetector CT imaging of the chest was performed using the standard protocol during bolus administration of intravenous contrast. Multiplanar CT image reconstructions and MIPs were obtained to evaluate the vascular anatomy. Multidetector CT imaging of the abdomen and pelvis was performed using the standard protocol during bolus administration of intravenous contrast. CONTRAST:  153mL OMNIPAQUE IOHEXOL 350 MG/ML SOLN COMPARISON:  CT abdomen pelvis 09/16/2020, radiograph 09/16/2020, 10/18/2020, CT angiography of the chest 03/30/2020 FINDINGS: CTA CHEST FINDINGS Cardiovascular: Satisfactory opacification of the pulmonary arteries. Peripherally marginated filling defects with some luminal narrowing or noted involving branches of the right lower lobe superior segmental pulmonary artery and lateral segmental pulmonary artery of the right middle lobe (5/191) some additional web-like filling defects versus suboptimal opacification seen involving the posterior lateral basal segments of the right lower lobe as well. No other discrete pulmonary arterial filling defects are identified. Central pulmonary artery enlargement is noted. Background of cardiomegaly and biatrial enlargement as well as evidence of prior sternotomy and CABG. No pericardial effusion. Slight reflux of contrast into the hepatic veins and IVC. Suboptimal opacification of the thoracic aorta for luminal assessment. Atherosclerotic plaque within the normal caliber aorta. Gross acute luminal abnormality is discernible. No periaortic stranding or hemorrhage. Tortuosity of the otherwise  normally branching great vessels. Mediastinum/Nodes: Scattered low-attenuation subcentimeter mediastinal and hilar nodes are favored to be reactive. No worrisome axillary adenopathy. Thyroid gland is unremarkable. No acute abnormality of the trachea or esophagus. Postsurgical changes in the anterior mediastinum without free mediastinal air or fluid. Lungs/Pleura: Extensive centrilobular and paraseptal emphysematous changes throughout the lungs. Diffuse airways thickening and scattered secretions likely reflecting some chronic bronchitic features as well given similarity of appearance in the lung bases on comparison imaging. Musculoskeletal: Prior sternotomy. Multilevel degenerative changes are present in the imaged portions of the spine. No acute osseous abnormality or suspicious osseous lesion. No worrisome chest wall mass or lesion. Review of the MIP  images confirms the above findings. CT ABDOMEN and PELVIS FINDINGS Hepatobiliary: Diffuse hepatic hypoattenuation compatible with hepatic steatosis. No concerning focal liver lesion. Smooth liver surface contour. Normal gallbladder and biliary tree without visible calcified gallstone. Pancreas: No pancreatic ductal dilatation or surrounding inflammatory changes. Spleen: Stable lobular contours of the spleen, otherwise normal in size without concerning focal lesion or acute abnormality. Adrenals/Urinary Tract: Normal adrenal glands. Kidneys enhance and excrete symmetrically and uniformly. Mild bilateral perinephric stranding is unchanged from comparison with areas of cortical scarring bilaterally. Few scattered subcentimeter hypoattenuating foci are present in both kidneys too small to fully characterize on CT imaging but statistically likely benign. No concerning focal renal lesion. No urolithiasis or hydronephrosis. Circumferential bladder wall thickening, greater than expected for mere underdistention with some indentation of bladder base by the median lobe  hypertrophy of the prostate. Stomach/Bowel: Distal esophagus, stomach and duodenum are unremarkable. No small bowel thickening or dilatation is seen. Normal appendix in the right lower quadrant in a retrocecal position. No colonic dilatation or wall thickening. No evidence of bowel obstruction. Vascular/Lymphatic: Atherosclerotic calcifications throughout the abdominal aorta and branch vessels including plaque at the splanchnic and renal artery ostia, incompletely assessed on this non angiographic technique. No aneurysm or ectasia. No suspicious or enlarged lymph nodes in the included lymphatic chains. Reproductive: Borderline prostatomegaly with median lobe hypertrophy. Seminal vesicles are unremarkable. Other: Mild body wall edema. No abdominopelvic free air or fluid. No bowel containing hernias. Musculoskeletal: Multilevel discogenic and facet degenerative changes present throughout the lumbar levels. Additional degenerative changes and enthesopathy about the hips pelvis. Partial fusion of the SI joints. No acute osseous abnormality or suspicious osseous lesion. Musculature is normal and symmetric. Review of the MIP images confirms the above findings. IMPRESSION: CT angiography chest: 1. Age indeterminate pulmonary artery emboli involving the segmental branches of the right middle and lower lobe in a region of prior pulmonary embolic disease. Some these filling defects demonstrate a peripheral and web like configuration as well as narrowing of the pulmonary arteries suggests a chronic process while others result in more complete opacification of the pulmonary artery. Acute chronic embolic phenomenon may be present. 2. Background of cardiomegaly limits assessment of right heart strain on CT imaging. Secondary features including pulmonary artery enlargement and reflux of contrast into the hepatic veins certainly suggest elevated right heart pressures of these may be related to underlying parenchymal disease given  extensive emphysematous changes and bronchitic features. 3. No other acute intrathoracic process. 4. Prior sternotomy and CABG. CT abdomen pelvis: 1. Persistent circumferential bladder wall thickening, possibly related to bladder outlet obstruction given a borderline enlarged prostate with median lobe hypertrophy. Correlate for clinical features of outlet obstruction and consider urinalysis to exclude a concomitant cystitis. 2. Chronic mild nonspecific perinephric stranding, favored to reflect diminished renal function or advanced age given additional features of bilateral renal cortical scarring. 3. Hepatic steatosis. 4.  Aortic Atherosclerosis (ICD10-I70.0). These results were called by telephone at the time of interpretation on 10/18/2020 at 4:03 am to provider [facility] , who verbally acknowledged these results. Electronically Signed   By: Lovena Le M.D.   On: 10/18/2020 04:04   CT ABDOMEN PELVIS W CONTRAST  Result Date: 10/18/2020 CLINICAL DATA:  Left abdominal tightness after caring cases of water, history of pulmonary embolism as well, concern for acute pulmonary embolism. EXAM: CT ANGIOGRAPHY CHEST CT ABDOMEN AND PELVIS WITH CONTRAST TECHNIQUE: Multidetector CT imaging of the chest was performed using the standard protocol during bolus administration of  intravenous contrast. Multiplanar CT image reconstructions and MIPs were obtained to evaluate the vascular anatomy. Multidetector CT imaging of the abdomen and pelvis was performed using the standard protocol during bolus administration of intravenous contrast. CONTRAST:  176mL OMNIPAQUE IOHEXOL 350 MG/ML SOLN COMPARISON:  CT abdomen pelvis 09/16/2020, radiograph 09/16/2020, 10/18/2020, CT angiography of the chest 03/30/2020 FINDINGS: CTA CHEST FINDINGS Cardiovascular: Satisfactory opacification of the pulmonary arteries. Peripherally marginated filling defects with some luminal narrowing or noted involving branches of the right lower lobe  superior segmental pulmonary artery and lateral segmental pulmonary artery of the right middle lobe (5/191) some additional web-like filling defects versus suboptimal opacification seen involving the posterior lateral basal segments of the right lower lobe as well. No other discrete pulmonary arterial filling defects are identified. Central pulmonary artery enlargement is noted. Background of cardiomegaly and biatrial enlargement as well as evidence of prior sternotomy and CABG. No pericardial effusion. Slight reflux of contrast into the hepatic veins and IVC. Suboptimal opacification of the thoracic aorta for luminal assessment. Atherosclerotic plaque within the normal caliber aorta. Gross acute luminal abnormality is discernible. No periaortic stranding or hemorrhage. Tortuosity of the otherwise normally branching great vessels. Mediastinum/Nodes: Scattered low-attenuation subcentimeter mediastinal and hilar nodes are favored to be reactive. No worrisome axillary adenopathy. Thyroid gland is unremarkable. No acute abnormality of the trachea or esophagus. Postsurgical changes in the anterior mediastinum without free mediastinal air or fluid. Lungs/Pleura: Extensive centrilobular and paraseptal emphysematous changes throughout the lungs. Diffuse airways thickening and scattered secretions likely reflecting some chronic bronchitic features as well given similarity of appearance in the lung bases on comparison imaging. Musculoskeletal: Prior sternotomy. Multilevel degenerative changes are present in the imaged portions of the spine. No acute osseous abnormality or suspicious osseous lesion. No worrisome chest wall mass or lesion. Review of the MIP images confirms the above findings. CT ABDOMEN and PELVIS FINDINGS Hepatobiliary: Diffuse hepatic hypoattenuation compatible with hepatic steatosis. No concerning focal liver lesion. Smooth liver surface contour. Normal gallbladder and biliary tree without visible calcified  gallstone. Pancreas: No pancreatic ductal dilatation or surrounding inflammatory changes. Spleen: Stable lobular contours of the spleen, otherwise normal in size without concerning focal lesion or acute abnormality. Adrenals/Urinary Tract: Normal adrenal glands. Kidneys enhance and excrete symmetrically and uniformly. Mild bilateral perinephric stranding is unchanged from comparison with areas of cortical scarring bilaterally. Few scattered subcentimeter hypoattenuating foci are present in both kidneys too small to fully characterize on CT imaging but statistically likely benign. No concerning focal renal lesion. No urolithiasis or hydronephrosis. Circumferential bladder wall thickening, greater than expected for mere underdistention with some indentation of bladder base by the median lobe hypertrophy of the prostate. Stomach/Bowel: Distal esophagus, stomach and duodenum are unremarkable. No small bowel thickening or dilatation is seen. Normal appendix in the right lower quadrant in a retrocecal position. No colonic dilatation or wall thickening. No evidence of bowel obstruction. Vascular/Lymphatic: Atherosclerotic calcifications throughout the abdominal aorta and branch vessels including plaque at the splanchnic and renal artery ostia, incompletely assessed on this non angiographic technique. No aneurysm or ectasia. No suspicious or enlarged lymph nodes in the included lymphatic chains. Reproductive: Borderline prostatomegaly with median lobe hypertrophy. Seminal vesicles are unremarkable. Other: Mild body wall edema. No abdominopelvic free air or fluid. No bowel containing hernias. Musculoskeletal: Multilevel discogenic and facet degenerative changes present throughout the lumbar levels. Additional degenerative changes and enthesopathy about the hips pelvis. Partial fusion of the SI joints. No acute osseous abnormality or suspicious osseous lesion. Musculature is normal  and symmetric. Review of the MIP images  confirms the above findings. IMPRESSION: CT angiography chest: 1. Age indeterminate pulmonary artery emboli involving the segmental branches of the right middle and lower lobe in a region of prior pulmonary embolic disease. Some these filling defects demonstrate a peripheral and web like configuration as well as narrowing of the pulmonary arteries suggests a chronic process while others result in more complete opacification of the pulmonary artery. Acute chronic embolic phenomenon may be present. 2. Background of cardiomegaly limits assessment of right heart strain on CT imaging. Secondary features including pulmonary artery enlargement and reflux of contrast into the hepatic veins certainly suggest elevated right heart pressures of these may be related to underlying parenchymal disease given extensive emphysematous changes and bronchitic features. 3. No other acute intrathoracic process. 4. Prior sternotomy and CABG. CT abdomen pelvis: 1. Persistent circumferential bladder wall thickening, possibly related to bladder outlet obstruction given a borderline enlarged prostate with median lobe hypertrophy. Correlate for clinical features of outlet obstruction and consider urinalysis to exclude a concomitant cystitis. 2. Chronic mild nonspecific perinephric stranding, favored to reflect diminished renal function or advanced age given additional features of bilateral renal cortical scarring. 3. Hepatic steatosis. 4.  Aortic Atherosclerosis (ICD10-I70.0). These results were called by telephone at the time of interpretation on 10/18/2020 at 4:03 am to provider [facility] , who verbally acknowledged these results. Electronically Signed   By: Lovena Le M.D.   On: 10/18/2020 04:04   US Venous Img Lower Bilateral (DVT)  Result Date: 10/18/2020 CLINICAL DATA:  Pulmonary emboli. Evaluate for residual clot burden. EXAM: BILATERAL LOWER EXTREMITY VENOUS DOPPLER ULTRASOUND TECHNIQUE: Gray-scale sonography with graded  compression, as well as color Doppler and duplex ultrasound were performed to evaluate the lower extremity deep venous systems from the level of the common femoral vein and including the common femoral, femoral, profunda femoral, popliteal and calf veins including the posterior tibial, peroneal and gastrocnemius veins when visible. The superficial great saphenous vein was also interrogated. Spectral Doppler was utilized to evaluate flow at rest and with distal augmentation maneuvers in the common femoral, femoral and popliteal veins. COMPARISON:  None. FINDINGS: RIGHT LOWER EXTREMITY Common Femoral Vein: No evidence of thrombus. Normal compressibility, respiratory phasicity and response to augmentation. Saphenofemoral Junction: No evidence of thrombus. Normal compressibility and flow on color Doppler imaging. Profunda Femoral Vein: No evidence of thrombus. Normal compressibility and flow on color Doppler imaging. Femoral Vein: No evidence of thrombus. Normal compressibility, respiratory phasicity and response to augmentation. Popliteal Vein: No evidence of thrombus. Normal compressibility, respiratory phasicity and response to augmentation. Calf Veins: No evidence of thrombus. Normal compressibility and flow on color Doppler imaging. Superficial Great Saphenous Vein: No evidence of thrombus. Normal compressibility. Venous Reflux:  None. Other Findings:  None. LEFT LOWER EXTREMITY Common Femoral Vein: No evidence of thrombus. Normal compressibility, respiratory phasicity and response to augmentation. Saphenofemoral Junction: No evidence of thrombus. Normal compressibility and flow on color Doppler imaging. Profunda Femoral Vein: No evidence of thrombus. Normal compressibility and flow on color Doppler imaging. Femoral Vein: No evidence of thrombus. Normal compressibility, respiratory phasicity and response to augmentation. Popliteal Vein: No evidence of thrombus. Normal compressibility, respiratory phasicity and  response to augmentation. Calf Veins: No evidence of thrombus. Normal compressibility and flow on color Doppler imaging. Superficial Great Saphenous Vein: No evidence of thrombus. Normal compressibility. Venous Reflux:  None. Other Findings:  None. IMPRESSION: No evidence of deep venous thrombosis in either lower extremity. Electronically Signed  By: Jacqulynn Cadet M.D.   On: 10/18/2020 15:14   DG Abdomen Acute W/Chest  Result Date: 10/18/2020 CLINICAL DATA:  75 year old male with abdominal pain. EXAM: DG ABDOMEN ACUTE WITH 1 VIEW CHEST COMPARISON:  CT abdomen pelvis dated 09/16/2020. FINDINGS: There is cardiomegaly with mild vascular congestion. No focal consolidation, pleural effusion, or pneumothorax. Median sternotomy wires and CABG vascular clips. No bowel dilatation or evidence of obstruction. No free air or radiopaque calculi. Degenerative changes of the spine. The osseous structures are intact. IMPRESSION: 1. Cardiomegaly with mild vascular congestion. 2. No bowel obstruction. Electronically Signed   By: Anner Crete M.D.   On: 10/18/2020 03:00   ECHOCARDIOGRAM COMPLETE  Result Date: 10/18/2020    ECHOCARDIOGRAM REPORT   Patient Name:   [name] Date of Exam: 10/18/2020 Medical Rec #:  [id number]       Height:       66.0 in Accession #:    [id number]      Weight:       224.3 lb Date of Birth:  [birth year]       BSA:          2.100 m Patient Age:    43 years        BP:           131/71 mmHg Patient Gender: M               HR:           62 bpm. Exam Location:  [facility] Procedure: 2D Echo, Cardiac Doppler and Color Doppler Indications:    Pulmonary Embolus I26.09  History:        Patient has prior history of Echocardiogram examinations, most                 recent 06/22/2019. CHF, CAD, Prior CABG, Stroke; Risk                 Factors:Hypertension and Dyslipidemia. History of LV thrombus.                 History of pulmonary embolism.  Sonographer:    Alvino Chapel RCS Referring Phys:  [id number] [place]  1. Left ventricular ejection fraction, by estimation, is 30 to 35%. The left ventricle has normal function. Left ventricular endocardial border not optimally defined to evaluate regional wall motion. Left ventricular diastolic parameters were normal. The entire apex is akinetic, there is some stasis of blood flow in the apex without clear thrombus. The distal inferior, anteroseptal walls are akinetic. The inferior wall is hypokinetic.  2. Right ventricular systolic function was not well visualized. The right ventricular size is not well visualized. There is moderately elevated pulmonary artery systolic pressure.  3. The mitral valve is normal in structure. No evidence of mitral valve regurgitation. No evidence of mitral stenosis.  4. The aortic valve is tricuspid. Aortic valve regurgitation is not visualized. No aortic stenosis is present.  5. The inferior vena cava is normal in size with greater than 50% respiratory variability, suggesting right atrial pressure of 3 mmHg. FINDINGS  Left Ventricle: Left ventricular ejection fraction, by estimation, is 30 to 35%. The left ventricle has normal function. Left ventricular endocardial border not optimally defined to evaluate regional wall motion. The left ventricular internal cavity size was normal in size. There is no left ventricular hypertrophy. Left ventricular diastolic parameters were normal.  LV Wall Scoring: The entire apex is akinetic, there is some stasis of blood  flow in the apex without clear thrombus. The distal inferior, anteroseptal walls are akinetic. The inferior wall is hypokinetic. Right Ventricle: RV is poorly visualized, grossly appears normal in size and function. The right ventricular size is not well visualized. Right vetricular wall thickness was not well visualized. Right ventricular systolic function was not well visualized. There is moderately elevated pulmonary artery systolic pressure. The tricuspid  regurgitant velocity is 3.27 m/s, and with an assumed right atrial pressure of 3 mmHg, the estimated right ventricular systolic pressure is 92.3 mmHg. Left Atrium: Left atrial size was normal in size. Right Atrium: Right atrial size was normal in size. Pericardium: The pericardium was not well visualized. Mitral Valve: The mitral valve is normal in structure. No evidence of mitral valve regurgitation. No evidence of mitral valve stenosis. Tricuspid Valve: The tricuspid valve is normal in structure. Tricuspid valve regurgitation is mild . No evidence of tricuspid stenosis. Aortic Valve: The aortic valve is tricuspid. Aortic valve regurgitation is not visualized. No aortic stenosis is present. Aortic valve mean gradient measures 2.3 mmHg. Aortic valve peak gradient measures 4.8 mmHg. Aortic valve area, by VTI measures 2.21 cm. Pulmonic Valve: The pulmonic valve was not well visualized. Pulmonic valve regurgitation is not visualized. No evidence of pulmonic stenosis. Aorta: The aortic root is normal in size and structure. Venous: The inferior vena cava is normal in size with greater than 50% respiratory variability, suggesting right atrial pressure of 3 mmHg. IAS/Shunts: No atrial level shunt detected by color flow Doppler.  LEFT VENTRICLE PLAX 2D LVIDd:         4.80 cm  Diastology LVIDs:         4.00 cm  LV e' medial:    6.74 cm/s LV PW:         1.00 cm  LV E/e' medial:  11.9 LV IVS:        1.10 cm  LV e' lateral:   7.18 cm/s LVOT diam:     1.90 cm  LV E/e' lateral: 11.2 LV SV:         52 LV SV Index:   25 LVOT Area:     2.84 cm  RIGHT VENTRICLE RV S prime:     8.38 cm/s TAPSE (M-mode): 2.0 cm LEFT ATRIUM             Index       RIGHT ATRIUM           Index LA diam:        4.40 cm 2.10 cm/m  RA Area:     19.70 cm LA Vol (A2C):   45.0 ml 21.43 ml/m RA Volume:   57.30 ml  27.29 ml/m LA Vol (A4C):   59.8 ml 28.48 ml/m LA Biplane Vol: 53.2 ml 25.34 ml/m  AORTIC VALVE AV Area (Vmax):    2.32 cm AV Area (Vmean):    2.12 cm AV Area (VTI):     2.21 cm AV Vmax:           109.36 cm/s AV Vmean:          71.318 cm/s AV VTI:            0.234 m AV Peak Grad:      4.8 mmHg AV Mean Grad:      2.3 mmHg LVOT Vmax:         89.50 cm/s LVOT Vmean:        53.400 cm/s LVOT VTI:  0.182 m LVOT/AV VTI ratio: 0.78  AORTA Ao Root diam: 3.40 cm MITRAL VALVE               TRICUSPID VALVE MV Area (PHT): 3.91 cm    TR Peak grad:   42.8 mmHg MV Decel Time: 194 msec    TR Vmax:        327.00 cm/s MV E velocity: 80.20 cm/s MV A velocity: 63.00 cm/s  SHUNTS MV E/A ratio:  1.27        Systemic VTI:  0.18 m                            Systemic Diam: 1.90 cm Carlyle Dolly MD Electronically signed by Carlyle Dolly MD Signature Date/Time: 10/18/2020/4:46:41 PM    Final        Discharge Exam: Vitals:   10/18/20 2005 10/19/20 0444  BP: (!) 114/57 115/67  Pulse: (!) 57 (!) 53  Resp: 18 20  Temp: 98.1 F (36.7 C) 98.5 F (36.9 C)  SpO2: 98% 96%     General: Pt is alert, awake, not in acute distress Cardiovascular: RRR, S1/S2 +, no edema Respiratory: CTA bilaterally, no wheezing, no rhonchi, no respiratory distress, no conversational dyspnea, on room air  Abdominal: Soft, NT, ND, bowel sounds + Extremities: no edema, no cyanosis Psych: Normal mood and affect, stable judgement and insight     The results of significant diagnostics from this hospitalization (including imaging, microbiology, ancillary and laboratory) are listed below for reference.     Microbiology: Recent Results (from the past 240 hour(s))  Resp Panel by RT-PCR (Flu A&B, Covid) Nasopharyngeal Swab     Status: None   Collection Time: 10/18/20  4:26 AM   Specimen: Nasopharyngeal Swab; Nasopharyngeal(NP) swabs in vial transport medium  Result Value Ref Range Status   SARS Coronavirus 2 by RT PCR NEGATIVE NEGATIVE Final    Comment: (NOTE) SARS-CoV-2 target nucleic acids are NOT DETECTED.  The SARS-CoV-2 RNA is generally detectable in upper  respiratory specimens during the acute phase of infection. The lowest concentration of SARS-CoV-2 viral copies this assay can detect is 138 copies/mL. A negative result does not preclude SARS-Cov-2 infection and should not be used as the sole basis for treatment or other patient management decisions. A negative result may occur with  improper specimen collection/handling, submission of specimen other than nasopharyngeal swab, presence of viral mutation(s) within the areas targeted by this assay, and inadequate number of viral copies(<138 copies/mL). A negative result must be combined with clinical observations, patient history, and epidemiological information. The expected result is Negative.  Fact Sheet for Patients:  EntrepreneurPulse.com.au  Fact Sheet for Healthcare Providers:  IncredibleEmployment.be  This test is no t yet approved or cleared by the [place] FDA and  has been authorized for detection and/or diagnosis of SARS-CoV-2 by FDA under an Emergency Use Authorization (EUA). This EUA will remain  in effect (meaning this test can be used) for the duration of the COVID-19 declaration under Section 564(b)(1) of the Act, 21 U.S.C.section 360bbb-3(b)(1), unless the authorization is terminated  or revoked sooner.       Influenza A by PCR NEGATIVE NEGATIVE Final   Influenza B by PCR NEGATIVE NEGATIVE Final    Comment: (NOTE) The Xpert Xpress SARS-CoV-2/FLU/RSV plus assay is intended as an aid in the diagnosis of influenza from Nasopharyngeal swab specimens and should not be used as a sole basis for treatment. Nasal washings  and aspirates are unacceptable for Xpert Xpress SARS-CoV-2/FLU/RSV testing.  Fact Sheet for Patients: EntrepreneurPulse.com.au  Fact Sheet for Healthcare Providers: IncredibleEmployment.be  This test is not yet approved or cleared by the [place] FDA and has been  authorized for detection and/or diagnosis of SARS-CoV-2 by FDA under an Emergency Use Authorization (EUA). This EUA will remain in effect (meaning this test can be used) for the duration of the COVID-19 declaration under Section 564(b)(1) of the Act, 21 U.S.C. section 360bbb-3(b)(1), unless the authorization is terminated or revoked.  Performed at [facility], [address] [place], [place] [address]      Labs: BNP (last 3 results) Recent Labs    09/16/20 0121 10/18/20 0156  BNP 208.0* 604.5*   Basic Metabolic Panel: Recent Labs  Lab 10/18/20 0156 10/19/20 0614  NA 137 138  K 3.6 4.3  CL 105 104  CO2 26 28  GLUCOSE 121* 104*  BUN 13 12  CREATININE 1.28* 1.29*  CALCIUM 8.2* 8.6*   Liver Function Tests: Recent Labs  Lab 10/18/20 0156 10/19/20 0614  AST 38 25  ALT 56* 43  ALKPHOS 70 54  BILITOT 1.6* 2.2*  PROT 7.1 6.4*  ALBUMIN 3.9 3.5   Recent Labs  Lab 10/18/20 0156 10/19/20 0614  LIPASE 65* 65*   No results for input(s): AMMONIA in the last 168 hours. CBC: Recent Labs  Lab 10/18/20 0156 10/19/20 0614  WBC 6.7 6.0  NEUTROABS 2.6  --   HGB 15.8 15.0  HCT 49.9 48.4  MCV 92.6 92.5  PLT 128* 112*   Cardiac Enzymes: No results for input(s): CKTOTAL, CKMB, CKMBINDEX, TROPONINI in the last 168 hours. BNP: Invalid input(s): POCBNP CBG: No results for input(s): GLUCAP in the last 168 hours. D-Dimer No results for input(s): DDIMER in the last 72 hours. Hgb A1c No results for input(s): HGBA1C in the last 72 hours. Lipid Profile No results for input(s): CHOL, HDL, LDLCALC, TRIG, CHOLHDL, LDLDIRECT in the last 72 hours. Thyroid function studies No results for input(s): TSH, T4TOTAL, T3FREE, THYROIDAB in the last 72 hours.  Invalid input(s): FREET3 Anemia work up No results for input(s): VITAMINB12, FOLATE, FERRITIN, TIBC, IRON, RETICCTPCT in the last 72 hours. Urinalysis    Component Value Date/Time   COLORURINE YELLOW 09/16/2020 0436    APPEARANCEUR CLEAR 09/16/2020 0436   LABSPEC >1.046 (H) 09/16/2020 0436   PHURINE 6.0 09/16/2020 0436   GLUCOSEU NEGATIVE 09/16/2020 0436   HGBUR NEGATIVE 09/16/2020 0436   BILIRUBINUR NEGATIVE 09/16/2020 0436   KETONESUR NEGATIVE 09/16/2020 0436   PROTEINUR 30 (A) 09/16/2020 0436   UROBILINOGEN 1.0 03/16/2010 0652   NITRITE NEGATIVE 09/16/2020 0436   LEUKOCYTESUR NEGATIVE 09/16/2020 0436   Sepsis Labs Invalid input(s): PROCALCITONIN,  WBC,  LACTICIDVEN Microbiology Recent Results (from the past 240 hour(s))  Resp Panel by RT-PCR (Flu A&B, Covid) Nasopharyngeal Swab     Status: None   Collection Time: 10/18/20  4:26 AM   Specimen: Nasopharyngeal Swab; Nasopharyngeal(NP) swabs in vial transport medium  Result Value Ref Range Status   SARS Coronavirus 2 by RT PCR NEGATIVE NEGATIVE Final    Comment: (NOTE) SARS-CoV-2 target nucleic acids are NOT DETECTED.  The SARS-CoV-2 RNA is generally detectable in upper respiratory specimens during the acute phase of infection. The lowest concentration of SARS-CoV-2 viral copies this assay can detect is 138 copies/mL. A negative result does not preclude SARS-Cov-2 infection and should not be used as the sole basis for treatment or other patient management decisions. A  negative result may occur with  improper specimen collection/handling, submission of specimen other than nasopharyngeal swab, presence of viral mutation(s) within the areas targeted by this assay, and inadequate number of viral copies(<138 copies/mL). A negative result must be combined with clinical observations, patient history, and epidemiological information. The expected result is Negative.  Fact Sheet for Patients:  EntrepreneurPulse.com.au  Fact Sheet for Healthcare Providers:  IncredibleEmployment.be  This test is no t yet approved or cleared by the [place] FDA and  has been authorized for detection and/or diagnosis of  SARS-CoV-2 by FDA under an Emergency Use Authorization (EUA). This EUA will remain  in effect (meaning this test can be used) for the duration of the COVID-19 declaration under Section 564(b)(1) of the Act, 21 U.S.C.section 360bbb-3(b)(1), unless the authorization is terminated  or revoked sooner.       Influenza A by PCR NEGATIVE NEGATIVE Final   Influenza B by PCR NEGATIVE NEGATIVE Final    Comment: (NOTE) The Xpert Xpress SARS-CoV-2/FLU/RSV plus assay is intended as an aid in the diagnosis of influenza from Nasopharyngeal swab specimens and should not be used as a sole basis for treatment. Nasal washings and aspirates are unacceptable for Xpert Xpress SARS-CoV-2/FLU/RSV testing.  Fact Sheet for Patients: EntrepreneurPulse.com.au  Fact Sheet for Healthcare Providers: IncredibleEmployment.be  This test is not yet approved or cleared by the [place] FDA and has been authorized for detection and/or diagnosis of SARS-CoV-2 by FDA under an Emergency Use Authorization (EUA). This EUA will remain in effect (meaning this test can be used) for the duration of the COVID-19 declaration under Section 564(b)(1) of the Act, 21 U.S.C. section 360bbb-3(b)(1), unless the authorization is terminated or revoked.  Performed at [facility], [address] [place], [place] [address]      Patient was seen and examined on the day of discharge and was found to be in stable condition. Time coordinating discharge: 45 minutes including assessment and coordination of care, as well as examination of the patient.   SIGNED:  Dessa Phi, DO [facility] 10/19/2020, 11:59 AM

## 2020-10-19 NOTE — Progress Notes (Signed)
ANTICOAGULATION CONSULT NOTE -   Pharmacy Consult for heparin Indication: pulmonary embolus  Allergies  Allergen Reactions   Lisinopril Swelling and Other (See Comments)    Mouth and tongue swells    Patient Measurements: Height: 5\' 6"  (167.6 cm) Weight: 100.6 kg (221 lb 12.5 oz) IBW/kg (Calculated) : 63.8 Heparin Dosing Weight: 85kg  Vital Signs: Temp: 98.5 F (36.9 C) (07/15 0444) BP: 115/67 (07/15 0444) Pulse Rate: 53 (07/15 0444)  Labs: Recent Labs    10/18/20 0156 10/18/20 0409 10/18/20 1310 10/18/20 2257 10/19/20 0614  HGB 15.8  --   --   --  15.0  HCT 49.9  --   --   --  48.4  PLT 128*  --   --   --  112*  APTT  --   --  146* 72* 87*  LABPROT  --   --   --   --  15.4*  INR  --   --   --   --  1.2  HEPARINUNFRC  --   --  0.99*  --  0.46  CREATININE 1.28*  --   --   --  1.29*  TROPONINIHS 10 9  --   --   --      Estimated Creatinine Clearance: 55.8 mL/min (A) (by C-G formula based on SCr of 1.29 mg/dL (H)).   Medical History: Past Medical History:  Diagnosis Date   Apical mural thrombus    Arteriosclerotic cardiovascular disease (ASCVD) 1998   Inferior MI->PCI of the RCA in 1998; acute anterior MI in 06/2002->  PCI of RCA and LAD with DES x2 to RCA, residual 80% ostial D1, and 80% mid CX and EF-40%; CABG-2011, LIMA-LAD, SVG to D1, OM1 & OM2; EF of 35-40% in 07/2010   Chronic anticoagulation    Chronic systolic CHF (congestive heart failure) (HCC)    CVA (cerebral vascular accident) ([place])    HTN (hypertension)    Hyperlipidemia    Keloid    median sternotomy   Pulmonary embolism ([place]) 03/2010   Substance abuse ([place])    formerly cocaine    Assessment: 75yo male c/o upper abdominal pain after lifting a heavy object, CT reveals chronic PE but cannot exclude acute PE, to transition from Eliquis to UFH; pt reports that his last dose of Eliquis was 7/13 1900 and does not recall missing any doses.  HL 0.46 APTT 87- therapeutic  Goal of Therapy:   Heparin level 0.3-0.7 units/ml aPTT 66-102 seconds Monitor platelets by anticoagulation protocol: Yes   Plan:  Continue heparin infusion @ 1150 units/hr Monitor APTT in 8 hours and HL daily Continue to monitor H&H and s/s of bleeding.  Thomasenia Sales, PharmD, MBA, BCGP Clinical Pharmacist  10/19/2020 8:55 AM

## 2020-10-23 ENCOUNTER — Ambulatory Visit (INDEPENDENT_AMBULATORY_CARE_PROVIDER_SITE_OTHER): Payer: Medicare Other | Admitting: *Deleted

## 2020-10-23 ENCOUNTER — Ambulatory Visit: Payer: Medicare Other | Admitting: *Deleted

## 2020-10-23 ENCOUNTER — Other Ambulatory Visit: Payer: Self-pay

## 2020-10-23 DIAGNOSIS — Z8673 Personal history of transient ischemic attack (TIA), and cerebral infarction without residual deficits: Secondary | ICD-10-CM | POA: Diagnosis not present

## 2020-10-23 DIAGNOSIS — I6330 Cerebral infarction due to thrombosis of unspecified cerebral artery: Secondary | ICD-10-CM | POA: Diagnosis not present

## 2020-10-23 DIAGNOSIS — I513 Intracardiac thrombosis, not elsewhere classified: Secondary | ICD-10-CM | POA: Diagnosis not present

## 2020-10-23 DIAGNOSIS — Z5181 Encounter for therapeutic drug level monitoring: Secondary | ICD-10-CM | POA: Diagnosis not present

## 2020-10-23 DIAGNOSIS — Z86711 Personal history of pulmonary embolism: Secondary | ICD-10-CM

## 2020-10-24 DIAGNOSIS — I2510 Atherosclerotic heart disease of native coronary artery without angina pectoris: Secondary | ICD-10-CM | POA: Diagnosis not present

## 2020-10-24 DIAGNOSIS — I2699 Other pulmonary embolism without acute cor pulmonale: Secondary | ICD-10-CM | POA: Diagnosis not present

## 2020-10-24 DIAGNOSIS — I10 Essential (primary) hypertension: Secondary | ICD-10-CM | POA: Diagnosis not present

## 2020-10-24 DIAGNOSIS — I5022 Chronic systolic (congestive) heart failure: Secondary | ICD-10-CM | POA: Diagnosis not present

## 2020-10-24 LAB — POCT INR
INR: 1.9 — AB (ref 2.0–3.0)
INR: 1.9 — AB (ref 2.0–3.0)

## 2020-10-24 NOTE — Patient Instructions (Signed)
D/C from hospital 7/15 on warfarin 5mg  daily (has had 4 doses) Continue warfarin 5mg  daily Stop Lovenox after Wednesday morning dose. Be consistent with Vit K foods Recheck INR on Monday 10/29/20

## 2020-10-29 ENCOUNTER — Ambulatory Visit (INDEPENDENT_AMBULATORY_CARE_PROVIDER_SITE_OTHER): Payer: Medicare Other | Admitting: *Deleted

## 2020-10-29 ENCOUNTER — Other Ambulatory Visit: Payer: Self-pay

## 2020-10-29 DIAGNOSIS — I513 Intracardiac thrombosis, not elsewhere classified: Secondary | ICD-10-CM

## 2020-10-29 DIAGNOSIS — Z86711 Personal history of pulmonary embolism: Secondary | ICD-10-CM

## 2020-10-29 DIAGNOSIS — Z8673 Personal history of transient ischemic attack (TIA), and cerebral infarction without residual deficits: Secondary | ICD-10-CM

## 2020-10-29 DIAGNOSIS — Z5181 Encounter for therapeutic drug level monitoring: Secondary | ICD-10-CM

## 2020-10-29 LAB — POCT INR: INR: 5.3 — AB (ref 2.0–3.0)

## 2020-10-29 NOTE — Patient Instructions (Signed)
D/C from hospital 7/15 on warfarin '5mg'$  daily (has had 4 doses) Hold warfarin tonight and tomorrow night then decrease dose to 1/2 tablet daily except 1 tablet on Sundays, Tuesdays and Thursdays Be consistent with Vit K foods Recheck INR on Monday 11/06/20

## 2020-10-30 ENCOUNTER — Inpatient Hospital Stay (HOSPITAL_COMMUNITY): Payer: Medicare Other | Attending: Hematology and Oncology | Admitting: Hematology and Oncology

## 2020-10-30 ENCOUNTER — Other Ambulatory Visit (HOSPITAL_COMMUNITY): Payer: Self-pay

## 2020-10-30 ENCOUNTER — Encounter (HOSPITAL_COMMUNITY): Payer: Self-pay | Admitting: Hematology and Oncology

## 2020-10-30 VITALS — BP 148/71 | HR 75 | Temp 96.7°F | Ht 66.0 in | Wt 225.2 lb

## 2020-10-30 DIAGNOSIS — J432 Centrilobular emphysema: Secondary | ICD-10-CM | POA: Diagnosis not present

## 2020-10-30 DIAGNOSIS — E785 Hyperlipidemia, unspecified: Secondary | ICD-10-CM | POA: Diagnosis not present

## 2020-10-30 DIAGNOSIS — Z87891 Personal history of nicotine dependence: Secondary | ICD-10-CM | POA: Insufficient documentation

## 2020-10-30 DIAGNOSIS — I252 Old myocardial infarction: Secondary | ICD-10-CM | POA: Insufficient documentation

## 2020-10-30 DIAGNOSIS — I2694 Multiple subsegmental pulmonary emboli without acute cor pulmonale: Secondary | ICD-10-CM

## 2020-10-30 DIAGNOSIS — Z888 Allergy status to other drugs, medicaments and biological substances status: Secondary | ICD-10-CM

## 2020-10-30 DIAGNOSIS — I2699 Other pulmonary embolism without acute cor pulmonale: Secondary | ICD-10-CM | POA: Diagnosis not present

## 2020-10-30 DIAGNOSIS — Z7901 Long term (current) use of anticoagulants: Secondary | ICD-10-CM

## 2020-10-30 DIAGNOSIS — Z809 Family history of malignant neoplasm, unspecified: Secondary | ICD-10-CM | POA: Diagnosis not present

## 2020-10-30 DIAGNOSIS — I2510 Atherosclerotic heart disease of native coronary artery without angina pectoris: Secondary | ICD-10-CM | POA: Diagnosis not present

## 2020-10-30 DIAGNOSIS — K760 Fatty (change of) liver, not elsewhere classified: Secondary | ICD-10-CM | POA: Diagnosis not present

## 2020-10-30 DIAGNOSIS — Z86711 Personal history of pulmonary embolism: Secondary | ICD-10-CM | POA: Diagnosis not present

## 2020-10-30 DIAGNOSIS — I700 Atherosclerosis of aorta: Secondary | ICD-10-CM

## 2020-10-30 DIAGNOSIS — Z8673 Personal history of transient ischemic attack (TIA), and cerebral infarction without residual deficits: Secondary | ICD-10-CM | POA: Diagnosis not present

## 2020-10-30 DIAGNOSIS — I5022 Chronic systolic (congestive) heart failure: Secondary | ICD-10-CM | POA: Diagnosis not present

## 2020-10-30 DIAGNOSIS — N400 Enlarged prostate without lower urinary tract symptoms: Secondary | ICD-10-CM | POA: Insufficient documentation

## 2020-10-30 DIAGNOSIS — M47816 Spondylosis without myelopathy or radiculopathy, lumbar region: Secondary | ICD-10-CM | POA: Diagnosis not present

## 2020-10-30 DIAGNOSIS — Z79899 Other long term (current) drug therapy: Secondary | ICD-10-CM

## 2020-10-30 DIAGNOSIS — R195 Other fecal abnormalities: Secondary | ICD-10-CM | POA: Diagnosis not present

## 2020-10-30 DIAGNOSIS — I110 Hypertensive heart disease with heart failure: Secondary | ICD-10-CM

## 2020-10-30 NOTE — Progress Notes (Signed)
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 Provoked in setting of CABG 10/18/2020: CT Angio chest showed age indeterminate pulmonary artery emboli involving the segmental branches of the right middle and lower lobe in a region of prior pulmonary embolic disease. [place] LE negative bilaterally.  10/30/2020: establish care with Dr. Lorenso Courier  CHIEF COMPLAINTS/PURPOSE OF CONSULTATION:  "Recurrent Pulmonary Embolism "  HISTORY OF PRESENTING ILLNESS:  Jaishawn L Chaddock 75 y.o. male with medical history significant for CAD, CHF, CVA, HTN, HLD, and past cocaine use who presents for evaluation of recurrent PE.   On review of the previous records Mr. Woosley previously developed a pulmonary embolism on 03/30/2010 after a CABG surgery.  He had no episodes of VTE and was actually being treated with Eliquis 5 mg twice daily when he developed chest tightness and shortness of breath on 10/18/2020.  At that time he went to the emergency department and a CT chest showed age-indeterminate pulmonary artery emboli involving the segmental branches of the right middle and lower lobe region.  His lower extremity Dopplers were negative.  He was transitioned onto Coumadin therapy.  Due to concern for the development of this pulmonary embolism while taking Eliquis the patient was referred to hematology for further evaluation and management.  On exam today her [place] notes that he feels improved since his visit to the emergency department.  He notes he is not having any shortness of breath  or chest tightness.  He notes he is also having any issues with bleeding, bruising, or dark stools.  He has been compliant with the Coumadin therapy and is holding the dosage further recommendations given his elevated INR.  On further discussion he notes he has no family history of bleeding or blood conditions.  He reports that he is a never smoker and does not drink.  He is currently disabled and is not employed.  He otherwise denies any fevers, chills, sweats, nausea, vomiting or diarrhea.  A full 10 point ROS is listed below.  MEDICAL HISTORY:  Past Medical History:  Diagnosis Date   Apical mural thrombus    Arteriosclerotic cardiovascular disease (ASCVD) 1998   Inferior MI->PCI of the RCA in 1998; acute anterior MI in 06/2002->  PCI of RCA and LAD with DES x2 to RCA, residual 80% ostial D1, and 80% mid CX and EF-40%; CABG-2011, LIMA-LAD, SVG to D1, OM1 & OM2; EF of 35-40% in 07/2010   Chronic anticoagulation    Chronic systolic CHF (congestive heart failure) (HCC)    CVA (cerebral vascular accident) ([place])    HTN (hypertension)    Hyperlipidemia    Keloid    median sternotomy   Pulmonary embolism ([place]) 03/2010   Substance abuse ([place])    formerly cocaine    SURGICAL HISTORY: Past Surgical History:  Procedure Laterality Date   COLONOSCOPY N/A 01/22/2017   Procedure: COLONOSCOPY;  Surgeon: Danie Binder, MD;  [facility];  Service: Endoscopy;  Laterality: N/A;  200   CORONARY ARTERY BYPASS GRAFT  03/18/2010   LIMA-LAD, SVG to diagonal, OM1 & OM2   CORONARY STENT INTERVENTION N/A 08/21/2017   Procedure: CORONARY STENT INTERVENTION;  Surgeon: [name], MD;  Location: [facility];  Service: Cardiovascular;  Laterality: N/A;   LEFT HEART CATH AND CORS/GRAFTS ANGIOGRAPHY N/A 08/21/2017   Procedure: LEFT HEART CATH AND CORS/GRAFTS ANGIOGRAPHY;  Surgeon: [name], MD;  Location: [facility];  Service: Cardiovascular;  Laterality: N/A;   LEFT HEART CATH  AND CORS/GRAFTS ANGIOGRAPHY N/A 06/21/2018   Procedure: LEFT HEART CATH AND CORS/GRAFTS ANGIOGRAPHY;  Surgeon: Nelva Bush, MD;  Location: [facility];  Service: Cardiovascular;  Laterality: N/A;   POLYPECTOMY  01/22/2017   Procedure: POLYPECTOMY;  Surgeon: Danie Binder, MD;  [facility];  Service: Endoscopy;;  Transverse(CS) and sigmoid colon(HS)   PTCA  06/1996   LAD & RCA   TEE WITHOUT CARDIOVERSION N/A 06/10/2012   Procedure: TRANSESOPHAGEAL ECHOCARDIOGRAM (TEE);  Surgeon: Josue Hector, MD;  [facility]: [facility];  Service: Cardiovascular;  Laterality: N/A;    SOCIAL HISTORY: Social History   Socioeconomic History   Marital status: Widowed    Spouse name: Not on file   Number of children: Not on file   Years of education: Not on file   Highest education level: Not on file  Occupational History   Occupation: disability  Tobacco Use   Smoking status: Former    Types: Cigarettes    Quit date: 2004    Years since quitting: 18.5   Smokeless tobacco: Never  Vaping Use   Vaping Use: Never used  Substance and Sexual Activity   Alcohol use: Not Currently    Comment: rare   Drug use: No    Types: Cocaine    Comment: Former cocaine abuse - last use for 51th birthday   Sexual activity: Not Currently  Other Topics Concern   Not on file  Social History Narrative   Not on file   Social Determinants of Health   Financial Resource Strain: Low Risk    Difficulty of Paying Living Expenses: Not very hard  Food Insecurity: No Food Insecurity   Worried About Charity fundraiser in the Last Year: Never true   [place] in the Last Year: Never true  Transportation Needs: No Transportation Needs   Lack of Transportation (Medical): No   Lack of Transportation (Non-Medical): No  Physical Activity: Insufficiently Active   Days of Exercise per Week: 2 days   Minutes of Exercise per Session: 20 min  Stress: No Stress Concern Present   Feeling of Stress :  Only a little  Social Connections: Moderately Isolated   Frequency of Communication with Friends and Family: Three times a week   Frequency of Social Gatherings with Friends and Family: Twice a week   Attends Religious Services: 1 to 4 times per year   Active Member of [employer] or Organizations: No   Attends Archivist Meetings: Never   Marital Status: Widowed  Human resources officer Violence: Not At Risk   Fear of Current or Ex-Partner: No   Emotionally Abused: No   Physically Abused: No   Sexually Abused: No    FAMILY HISTORY: Family History  Problem Relation Age of Onset   Cancer Mother 90    ALLERGIES:  is allergic to lisinopril.  MEDICATIONS:  Current Outpatient Medications  Medication Sig Dispense Refill   atorvastatin (LIPITOR) 40 MG tablet Take 1 tablet (40 mg total) by mouth daily at 6 PM. 30 tablet 0   clopidogrel (PLAVIX) 75 MG tablet Take 75 mg by mouth daily.  cycloSPORINE (RESTASIS) 0.05 % ophthalmic emulsion Place 1 drop into both eyes daily as needed (dry eye).      fluticasone (FLONASE) 50 MCG/ACT nasal spray See admin instructions.     furosemide (LASIX) 20 MG tablet TAKE 3 TABLETS BY MOUTH EVERY DAY 270 tablet 3   hydrALAZINE (APRESOLINE) 25 MG tablet Take 1 tablet (25 mg total) by mouth 3 (three) times daily. 270 tablet 3   isosorbide mononitrate (IMDUR) 60 MG 24 hr tablet Take 1 tablet (60 mg total) by mouth daily. 90 tablet 3   metoprolol succinate (TOPROL-XL) 50 MG 24 hr tablet Take 50 mg by mouth daily.  3   nitroGLYCERIN (NITROSTAT) 0.3 MG SL tablet Place 0.3 mg under the tongue as directed.     traMADol (ULTRAM) 50 MG tablet Take 50 mg by mouth 3 (three) times daily.     warfarin (COUMADIN) 5 MG tablet Take 1 tablet (5 mg total) by mouth daily at 4 PM. 30 tablet 0   enoxaparin (LOVENOX) 100 MG/ML injection Inject 1 mL (100 mg total) into the skin every 12 (twelve) hours for 7 days. 14 mL 0   HYDROcodone-acetaminophen (NORCO) 5-325 MG tablet Take 1  tablet by mouth every 6 (six) hours as needed for severe pain. (Patient not taking: Reported on 10/30/2020) 10 tablet 0   nitroGLYCERIN (NITROSTAT) 0.4 MG SL tablet Place 1 tablet (0.4 mg total) under the tongue every 5 (five) minutes as needed for chest pain. up to 3 doses. (Patient not taking: Reported on 10/30/2020) 25 tablet 4   No current facility-administered medications for this visit.    REVIEW OF SYSTEMS:   Constitutional: ( - ) fevers, ( - )  chills , ( - ) night sweats Eyes: ( - ) blurriness of vision, ( - ) double vision, ( - ) watery eyes Ears, nose, mouth, throat, and face: ( - ) mucositis, ( - ) sore throat Respiratory: ( - ) cough, ( - ) dyspnea, ( - ) wheezes Cardiovascular: ( - ) palpitation, ( - ) chest discomfort, ( - ) lower extremity swelling Gastrointestinal:  ( - ) nausea, ( - ) heartburn, ( - ) change in bowel habits Skin: ( - ) abnormal skin rashes Lymphatics: ( - ) new lymphadenopathy, ( - ) easy bruising Neurological: ( - ) numbness, ( - ) tingling, ( - ) new weaknesses Behavioral/Psych: ( - ) mood change, ( - ) new changes  All other systems were reviewed with the patient and are negative.  PHYSICAL EXAMINATION:  Vitals:   10/30/20 1039  BP: (!) 148/71  Pulse: 75  Temp: (!) 96.7 F (35.9 C)  SpO2: 97%   Filed Weights   10/30/20 1039  Weight: 225 lb 3.2 oz (102.2 kg)    GENERAL: well appearing elderly African-American male in NAD  SKIN: skin color, texture, turgor are normal, no rashes or significant lesions EYES: conjunctiva are pink and non-injected, sclera clear LUNGS: clear to auscultation and percussion with normal breathing effort HEART: regular rate & rhythm and no murmurs and no lower extremity edema Musculoskeletal: no cyanosis of digits and no clubbing  PSYCH: alert & oriented x 3, fluent speech NEURO: no focal motor/sensory deficits  LABORATORY DATA:  I have reviewed the data as listed CBC Latest Ref Rng & Units 10/19/2020 10/18/2020  09/16/2020  WBC 4.0 - 10.5 K/uL 6.0 6.7 5.4  Hemoglobin 13.0 - 17.0 g/dL 15.0 15.8 15.8  Hematocrit 39.0 - 52.0 % 48.4 49.9 48.7  Platelets 150 - 400 K/uL 112(L) 128(L) 132(L)    CMP Latest Ref Rng & Units 10/19/2020 10/18/2020 09/16/2020  Glucose 70 - 99 mg/dL 104(H) 121(H) 111(H)  BUN 8 - 23 mg/dL '12 13 13  '$ Creatinine 0.61 - 1.24 mg/dL 1.29(H) 1.28(H) 1.32(H)  Sodium 135 - 145 mmol/L 138 137 138  Potassium 3.5 - 5.1 mmol/L 4.3 3.6 3.7  Chloride 98 - 111 mmol/L 104 105 104  CO2 22 - 32 mmol/L '28 26 27  '$ Calcium 8.9 - 10.3 mg/dL 8.6(L) 8.2(L) 8.7(L)  Total Protein 6.5 - 8.1 g/dL 6.4(L) 7.1 7.0  Total Bilirubin 0.3 - 1.2 mg/dL 2.2(H) 1.6(H) 1.8(H)  Alkaline Phos 38 - 126 U/L 54 70 59  AST 15 - 41 U/L 25 38 25  ALT 0 - 44 U/L 43 56(H) 30    RADIOGRAPHIC STUDIES: CT Angio Chest Pulmonary Embolism (PE) W or WO Contrast  Result Date: 10/18/2020 CLINICAL DATA:  Left abdominal tightness after caring cases of water, history of pulmonary embolism as well, concern for acute pulmonary embolism. EXAM: CT ANGIOGRAPHY CHEST CT ABDOMEN AND PELVIS WITH CONTRAST TECHNIQUE: Multidetector CT imaging of the chest was performed using the standard protocol during bolus administration of intravenous contrast. Multiplanar CT image reconstructions and MIPs were obtained to evaluate the vascular anatomy. Multidetector CT imaging of the abdomen and pelvis was performed using the standard protocol during bolus administration of intravenous contrast. CONTRAST:  122m OMNIPAQUE IOHEXOL 350 MG/ML SOLN COMPARISON:  CT abdomen pelvis 09/16/2020, radiograph 09/16/2020, 10/18/2020, CT angiography of the chest 03/30/2020 FINDINGS: CTA CHEST FINDINGS Cardiovascular: Satisfactory opacification of the pulmonary arteries. Peripherally marginated filling defects with some luminal narrowing or noted involving branches of the right lower lobe superior segmental pulmonary artery and lateral segmental pulmonary artery of the right middle  lobe (5/191) some additional web-like filling defects versus suboptimal opacification seen involving the posterior lateral basal segments of the right lower lobe as well. No other discrete pulmonary arterial filling defects are identified. Central pulmonary artery enlargement is noted. Background of cardiomegaly and biatrial enlargement as well as evidence of prior sternotomy and CABG. No pericardial effusion. Slight reflux of contrast into the hepatic veins and IVC. Suboptimal opacification of the thoracic aorta for luminal assessment. Atherosclerotic plaque within the normal caliber aorta. Gross acute luminal abnormality is discernible. No periaortic stranding or hemorrhage. Tortuosity of the otherwise normally branching great vessels. Mediastinum/Nodes: Scattered low-attenuation subcentimeter mediastinal and hilar nodes are favored to be reactive. No worrisome axillary adenopathy. Thyroid gland is unremarkable. No acute abnormality of the trachea or esophagus. Postsurgical changes in the anterior mediastinum without free mediastinal air or fluid. Lungs/Pleura: Extensive centrilobular and paraseptal emphysematous changes throughout the lungs. Diffuse airways thickening and scattered secretions likely reflecting some chronic bronchitic features as well given similarity of appearance in the lung bases on comparison imaging. Musculoskeletal: Prior sternotomy. Multilevel degenerative changes are present in the imaged portions of the spine. No acute osseous abnormality or suspicious osseous lesion. No worrisome chest wall mass or lesion. Review of the MIP images confirms the above findings. CT ABDOMEN and PELVIS FINDINGS Hepatobiliary: Diffuse hepatic hypoattenuation compatible with hepatic steatosis. No concerning focal liver lesion. Smooth liver surface contour. Normal gallbladder and biliary tree without visible calcified gallstone. Pancreas: No pancreatic ductal dilatation or surrounding inflammatory changes.  Spleen: Stable lobular contours of the spleen, otherwise normal in size without concerning focal lesion or acute abnormality. Adrenals/Urinary Tract: Normal adrenal glands. Kidneys enhance and excrete symmetrically and uniformly. Mild bilateral perinephric stranding  is unchanged from comparison with areas of cortical scarring bilaterally. Few scattered subcentimeter hypoattenuating foci are present in both kidneys too small to fully characterize on CT imaging but statistically likely benign. No concerning focal renal lesion. No urolithiasis or hydronephrosis. Circumferential bladder wall thickening, greater than expected for mere underdistention with some indentation of bladder base by the median lobe hypertrophy of the prostate. Stomach/Bowel: Distal esophagus, stomach and duodenum are unremarkable. No small bowel thickening or dilatation is seen. Normal appendix in the right lower quadrant in a retrocecal position. No colonic dilatation or wall thickening. No evidence of bowel obstruction. Vascular/Lymphatic: Atherosclerotic calcifications throughout the abdominal aorta and branch vessels including plaque at the splanchnic and renal artery ostia, incompletely assessed on this non angiographic technique. No aneurysm or ectasia. No suspicious or enlarged lymph nodes in the included lymphatic chains. Reproductive: Borderline prostatomegaly with median lobe hypertrophy. Seminal vesicles are unremarkable. Other: Mild body wall edema. No abdominopelvic free air or fluid. No bowel containing hernias. Musculoskeletal: Multilevel discogenic and facet degenerative changes present throughout the lumbar levels. Additional degenerative changes and enthesopathy about the hips pelvis. Partial fusion of the SI joints. No acute osseous abnormality or suspicious osseous lesion. Musculature is normal and symmetric. Review of the MIP images confirms the above findings. IMPRESSION: CT angiography chest: 1. Age indeterminate pulmonary  artery emboli involving the segmental branches of the right middle and lower lobe in a region of prior pulmonary embolic disease. Some these filling defects demonstrate a peripheral and web like configuration as well as narrowing of the pulmonary arteries suggests a chronic process while others result in more complete opacification of the pulmonary artery. Acute chronic embolic phenomenon may be present. 2. Background of cardiomegaly limits assessment of right heart strain on CT imaging. Secondary features including pulmonary artery enlargement and reflux of contrast into the hepatic veins certainly suggest elevated right heart pressures of these may be related to underlying parenchymal disease given extensive emphysematous changes and bronchitic features. 3. No other acute intrathoracic process. 4. Prior sternotomy and CABG. CT abdomen pelvis: 1. Persistent circumferential bladder wall thickening, possibly related to bladder outlet obstruction given a borderline enlarged prostate with median lobe hypertrophy. Correlate for clinical features of outlet obstruction and consider urinalysis to exclude a concomitant cystitis. 2. Chronic mild nonspecific perinephric stranding, favored to reflect diminished renal function or advanced age given additional features of bilateral renal cortical scarring. 3. Hepatic steatosis. 4.  Aortic Atherosclerosis (ICD10-I70.0). These results were called by telephone at the time of interpretation on 10/18/2020 at 4:03 am to provider [facility] , who verbally acknowledged these results. Electronically Signed   By: Lovena Le M.D.   On: 10/18/2020 04:04   CT ABDOMEN PELVIS W CONTRAST  Result Date: 10/18/2020 CLINICAL DATA:  Left abdominal tightness after caring cases of water, history of pulmonary embolism as well, concern for acute pulmonary embolism. EXAM: CT ANGIOGRAPHY CHEST CT ABDOMEN AND PELVIS WITH CONTRAST TECHNIQUE: Multidetector CT imaging of the chest was performed  using the standard protocol during bolus administration of intravenous contrast. Multiplanar CT image reconstructions and MIPs were obtained to evaluate the vascular anatomy. Multidetector CT imaging of the abdomen and pelvis was performed using the standard protocol during bolus administration of intravenous contrast. CONTRAST:  185m OMNIPAQUE IOHEXOL 350 MG/ML SOLN COMPARISON:  CT abdomen pelvis 09/16/2020, radiograph 09/16/2020, 10/18/2020, CT angiography of the chest 03/30/2020 FINDINGS: CTA CHEST FINDINGS Cardiovascular: Satisfactory opacification of the pulmonary arteries. Peripherally marginated filling defects with some luminal narrowing or noted  involving branches of the right lower lobe superior segmental pulmonary artery and lateral segmental pulmonary artery of the right middle lobe (5/191) some additional web-like filling defects versus suboptimal opacification seen involving the posterior lateral basal segments of the right lower lobe as well. No other discrete pulmonary arterial filling defects are identified. Central pulmonary artery enlargement is noted. Background of cardiomegaly and biatrial enlargement as well as evidence of prior sternotomy and CABG. No pericardial effusion. Slight reflux of contrast into the hepatic veins and IVC. Suboptimal opacification of the thoracic aorta for luminal assessment. Atherosclerotic plaque within the normal caliber aorta. Gross acute luminal abnormality is discernible. No periaortic stranding or hemorrhage. Tortuosity of the otherwise normally branching great vessels. Mediastinum/Nodes: Scattered low-attenuation subcentimeter mediastinal and hilar nodes are favored to be reactive. No worrisome axillary adenopathy. Thyroid gland is unremarkable. No acute abnormality of the trachea or esophagus. Postsurgical changes in the anterior mediastinum without free mediastinal air or fluid. Lungs/Pleura: Extensive centrilobular and paraseptal emphysematous changes  throughout the lungs. Diffuse airways thickening and scattered secretions likely reflecting some chronic bronchitic features as well given similarity of appearance in the lung bases on comparison imaging. Musculoskeletal: Prior sternotomy. Multilevel degenerative changes are present in the imaged portions of the spine. No acute osseous abnormality or suspicious osseous lesion. No worrisome chest wall mass or lesion. Review of the MIP images confirms the above findings. CT ABDOMEN and PELVIS FINDINGS Hepatobiliary: Diffuse hepatic hypoattenuation compatible with hepatic steatosis. No concerning focal liver lesion. Smooth liver surface contour. Normal gallbladder and biliary tree without visible calcified gallstone. Pancreas: No pancreatic ductal dilatation or surrounding inflammatory changes. Spleen: Stable lobular contours of the spleen, otherwise normal in size without concerning focal lesion or acute abnormality. Adrenals/Urinary Tract: Normal adrenal glands. Kidneys enhance and excrete symmetrically and uniformly. Mild bilateral perinephric stranding is unchanged from comparison with areas of cortical scarring bilaterally. Few scattered subcentimeter hypoattenuating foci are present in both kidneys too small to fully characterize on CT imaging but statistically likely benign. No concerning focal renal lesion. No urolithiasis or hydronephrosis. Circumferential bladder wall thickening, greater than expected for mere underdistention with some indentation of bladder base by the median lobe hypertrophy of the prostate. Stomach/Bowel: Distal esophagus, stomach and duodenum are unremarkable. No small bowel thickening or dilatation is seen. Normal appendix in the right lower quadrant in a retrocecal position. No colonic dilatation or wall thickening. No evidence of bowel obstruction. Vascular/Lymphatic: Atherosclerotic calcifications throughout the abdominal aorta and branch vessels including plaque at the splanchnic and  renal artery ostia, incompletely assessed on this non angiographic technique. No aneurysm or ectasia. No suspicious or enlarged lymph nodes in the included lymphatic chains. Reproductive: Borderline prostatomegaly with median lobe hypertrophy. Seminal vesicles are unremarkable. Other: Mild body wall edema. No abdominopelvic free air or fluid. No bowel containing hernias. Musculoskeletal: Multilevel discogenic and facet degenerative changes present throughout the lumbar levels. Additional degenerative changes and enthesopathy about the hips pelvis. Partial fusion of the SI joints. No acute osseous abnormality or suspicious osseous lesion. Musculature is normal and symmetric. Review of the MIP images confirms the above findings. IMPRESSION: CT angiography chest: 1. Age indeterminate pulmonary artery emboli involving the segmental branches of the right middle and lower lobe in a region of prior pulmonary embolic disease. Some these filling defects demonstrate a peripheral and web like configuration as well as narrowing of the pulmonary arteries suggests a chronic process while others result in more complete opacification of the pulmonary artery. Acute chronic embolic phenomenon may  be present. 2. Background of cardiomegaly limits assessment of right heart strain on CT imaging. Secondary features including pulmonary artery enlargement and reflux of contrast into the hepatic veins certainly suggest elevated right heart pressures of these may be related to underlying parenchymal disease given extensive emphysematous changes and bronchitic features. 3. No other acute intrathoracic process. 4. Prior sternotomy and CABG. CT abdomen pelvis: 1. Persistent circumferential bladder wall thickening, possibly related to bladder outlet obstruction given a borderline enlarged prostate with median lobe hypertrophy. Correlate for clinical features of outlet obstruction and consider urinalysis to exclude a concomitant cystitis. 2.  Chronic mild nonspecific perinephric stranding, favored to reflect diminished renal function or advanced age given additional features of bilateral renal cortical scarring. 3. Hepatic steatosis. 4.  Aortic Atherosclerosis (ICD10-I70.0). These results were called by telephone at the time of interpretation on 10/18/2020 at 4:03 am to provider [facility] , who verbally acknowledged these results. Electronically Signed   By: Lovena Le M.D.   On: 10/18/2020 04:04   US Venous Img Lower Bilateral (DVT)  Result Date: 10/18/2020 CLINICAL DATA:  Pulmonary emboli. Evaluate for residual clot burden. EXAM: BILATERAL LOWER EXTREMITY VENOUS DOPPLER ULTRASOUND TECHNIQUE: Gray-scale sonography with graded compression, as well as color Doppler and duplex ultrasound were performed to evaluate the lower extremity deep venous systems from the level of the common femoral vein and including the common femoral, femoral, profunda femoral, popliteal and calf veins including the posterior tibial, peroneal and gastrocnemius veins when visible. The superficial great saphenous vein was also interrogated. Spectral Doppler was utilized to evaluate flow at rest and with distal augmentation maneuvers in the common femoral, femoral and popliteal veins. COMPARISON:  None. FINDINGS: RIGHT LOWER EXTREMITY Common Femoral Vein: No evidence of thrombus. Normal compressibility, respiratory phasicity and response to augmentation. Saphenofemoral Junction: No evidence of thrombus. Normal compressibility and flow on color Doppler imaging. Profunda Femoral Vein: No evidence of thrombus. Normal compressibility and flow on color Doppler imaging. Femoral Vein: No evidence of thrombus. Normal compressibility, respiratory phasicity and response to augmentation. Popliteal Vein: No evidence of thrombus. Normal compressibility, respiratory phasicity and response to augmentation. Calf Veins: No evidence of thrombus. Normal compressibility and flow on color  Doppler imaging. Superficial Great Saphenous Vein: No evidence of thrombus. Normal compressibility. Venous Reflux:  None. Other Findings:  None. LEFT LOWER EXTREMITY Common Femoral Vein: No evidence of thrombus. Normal compressibility, respiratory phasicity and response to augmentation. Saphenofemoral Junction: No evidence of thrombus. Normal compressibility and flow on color Doppler imaging. Profunda Femoral Vein: No evidence of thrombus. Normal compressibility and flow on color Doppler imaging. Femoral Vein: No evidence of thrombus. Normal compressibility, respiratory phasicity and response to augmentation. Popliteal Vein: No evidence of thrombus. Normal compressibility, respiratory phasicity and response to augmentation. Calf Veins: No evidence of thrombus. Normal compressibility and flow on color Doppler imaging. Superficial Great Saphenous Vein: No evidence of thrombus. Normal compressibility. Venous Reflux:  None. Other Findings:  None. IMPRESSION: No evidence of deep venous thrombosis in either lower extremity. Electronically Signed   By: Jacqulynn Cadet M.D.   On: 10/18/2020 15:14   DG Abdomen Acute W/Chest  Result Date: 10/18/2020 CLINICAL DATA:  75 year old male with abdominal pain. EXAM: DG ABDOMEN ACUTE WITH 1 VIEW CHEST COMPARISON:  CT abdomen pelvis dated 09/16/2020. FINDINGS: There is cardiomegaly with mild vascular congestion. No focal consolidation, pleural effusion, or pneumothorax. Median sternotomy wires and CABG vascular clips. No bowel dilatation or evidence of obstruction. No free air or radiopaque calculi. Degenerative changes  of the spine. The osseous structures are intact. IMPRESSION: 1. Cardiomegaly with mild vascular congestion. 2. No bowel obstruction. Electronically Signed   By: Anner Crete M.D.   On: 10/18/2020 03:00   ECHOCARDIOGRAM COMPLETE  Result Date: 10/18/2020    ECHOCARDIOGRAM REPORT   Patient Name:   MARKAVION CHRISLER Date of Exam: 10/18/2020 Medical Rec #:   [id number]       Height:       66.0 in Accession #:    [id number]      Weight:       224.3 lb Date of Birth:  January 20, 1946       BSA:          2.100 m Patient Age:    75 years        BP:           131/71 mmHg Patient Gender: M               HR:           62 bpm. Exam Location:  [facility] Procedure: 2D Echo, Cardiac Doppler and Color Doppler Indications:    Pulmonary Embolus I26.09  History:        Patient has prior history of Echocardiogram examinations, most                 recent 06/22/2019. CHF, CAD, Prior CABG, Stroke; Risk                 Factors:Hypertension and Dyslipidemia. History of LV thrombus.                 History of pulmonary embolism.  Sonographer:    Alvino Chapel RCS Referring Phys: [id number] [place]  1. Left ventricular ejection fraction, by estimation, is 30 to 35%. The left ventricle has normal function. Left ventricular endocardial border not optimally defined to evaluate regional wall motion. Left ventricular diastolic parameters were normal. The entire apex is akinetic, there is some stasis of blood flow in the apex without clear thrombus. The distal inferior, anteroseptal walls are akinetic. The inferior wall is hypokinetic.  2. Right ventricular systolic function was not well visualized. The right ventricular size is not well visualized. There is moderately elevated pulmonary artery systolic pressure.  3. The mitral valve is normal in structure. No evidence of mitral valve regurgitation. No evidence of mitral stenosis.  4. The aortic valve is tricuspid. Aortic valve regurgitation is not visualized. No aortic stenosis is present.  5. The inferior vena cava is normal in size with greater than 50% respiratory variability, suggesting right atrial pressure of 3 mmHg. FINDINGS  Left Ventricle: Left ventricular ejection fraction, by estimation, is 30 to 35%. The left ventricle has normal function. Left ventricular endocardial border not optimally defined to evaluate regional wall  motion. The left ventricular internal cavity size was normal in size. There is no left ventricular hypertrophy. Left ventricular diastolic parameters were normal.  LV Wall Scoring: The entire apex is akinetic, there is some stasis of blood flow in the apex without clear thrombus. The distal inferior, anteroseptal walls are akinetic. The inferior wall is hypokinetic. Right Ventricle: RV is poorly visualized, grossly appears normal in size and function. The right ventricular size is not well visualized. Right vetricular wall thickness was not well visualized. Right ventricular systolic function was not well visualized. There is moderately elevated pulmonary artery systolic pressure. The tricuspid regurgitant velocity is 3.27 m/s, and with an assumed right atrial pressure of  3 mmHg, the estimated right ventricular systolic pressure is 0000000 mmHg. Left Atrium: Left atrial size was normal in size. Right Atrium: Right atrial size was normal in size. Pericardium: The pericardium was not well visualized. Mitral Valve: The mitral valve is normal in structure. No evidence of mitral valve regurgitation. No evidence of mitral valve stenosis. Tricuspid Valve: The tricuspid valve is normal in structure. Tricuspid valve regurgitation is mild . No evidence of tricuspid stenosis. Aortic Valve: The aortic valve is tricuspid. Aortic valve regurgitation is not visualized. No aortic stenosis is present. Aortic valve mean gradient measures 2.3 mmHg. Aortic valve peak gradient measures 4.8 mmHg. Aortic valve area, by VTI measures 2.21 cm. Pulmonic Valve: The pulmonic valve was not well visualized. Pulmonic valve regurgitation is not visualized. No evidence of pulmonic stenosis. Aorta: The aortic root is normal in size and structure. Venous: The inferior vena cava is normal in size with greater than 50% respiratory variability, suggesting right atrial pressure of 3 mmHg. IAS/Shunts: No atrial level shunt detected by color flow Doppler.   LEFT VENTRICLE PLAX 2D LVIDd:         4.80 cm  Diastology LVIDs:         4.00 cm  LV e' medial:    6.74 cm/s LV PW:         1.00 cm  LV E/e' medial:  11.9 LV IVS:        1.10 cm  LV e' lateral:   7.18 cm/s LVOT diam:     1.90 cm  LV E/e' lateral: 11.2 LV SV:         52 LV SV Index:   25 LVOT Area:     2.84 cm  RIGHT VENTRICLE RV S prime:     8.38 cm/s TAPSE (M-mode): 2.0 cm LEFT ATRIUM             Index       RIGHT ATRIUM           Index LA diam:        4.40 cm 2.10 cm/m  RA Area:     19.70 cm LA Vol (A2C):   45.0 ml 21.43 ml/m RA Volume:   57.30 ml  27.29 ml/m LA Vol (A4C):   59.8 ml 28.48 ml/m LA Biplane Vol: 53.2 ml 25.34 ml/m  AORTIC VALVE AV Area (Vmax):    2.32 cm AV Area (Vmean):   2.12 cm AV Area (VTI):     2.21 cm AV Vmax:           109.36 cm/s AV Vmean:          71.318 cm/s AV VTI:            0.234 m AV Peak Grad:      4.8 mmHg AV Mean Grad:      2.3 mmHg LVOT Vmax:         89.50 cm/s LVOT Vmean:        53.400 cm/s LVOT VTI:          0.182 m LVOT/AV VTI ratio: 0.78  AORTA Ao Root diam: 3.40 cm MITRAL VALVE               TRICUSPID VALVE MV Area (PHT): 3.91 cm    TR Peak grad:   42.8 mmHg MV Decel Time: 194 msec    TR Vmax:        327.00 cm/s MV E velocity: 80.20 cm/s MV A velocity: 63.00 cm/s  SHUNTS MV  E/A ratio:  1.27        Systemic VTI:  0.18 m                            Systemic Diam: 1.90 cm Carlyle Dolly MD Electronically signed by Carlyle Dolly MD Signature Date/Time: 10/18/2020/4:46:41 PM    Final     ASSESSMENT & PLAN Evaristo Bury 75 y.o. male with medical history significant for CAD, CHF, CVA, HTN, HLD, and past cocaine use who presents for evaluation of recurrent PE.   After review of the labs, review of the records, and discussion with the patient the patients findings are most consistent with recurrent pulmonary embolism of unclear etiology.  At this time I do believe the patient will need to proceed with indefinite anticoagulation.  He is started on Coumadin therapy and  is having some difficulties keeping his INR within target range.  Most recently his INR was greater than 5 and he is holding in order to reach normal levels.  It is unclear why the Eliquis failed, however the patient notes that he was compliant with this therapy.  He is not having any issues with bleeding, bruising, or dark stools at this time.  Therefore we can continue with a goal INR of 2.0-3.0.  # Recurrent Pulmonary Embolism -- Findings are most consistent with a recurrent pulmonary embolism of unclear etiology. --Agree with continuation of Coumadin therapy and following with Coumadin clinic in order for dosing --Unclear if this most recent imaging represents an acute DVT or a chronic one.  To err on the side of caution but agree with discontinuing Eliquis and transitioning to Coumadin --Hypercoagulation studies would not change management at this time.  Therefore would recommend holding on the studies. --Return to clinic in 3 months time in order to continue reevaluating.  No orders of the defined types were placed in this encounter.   All questions were answered. The patient knows to call the clinic with any problems, questions or concerns.  A total of more than 60 minutes were spent on this encounter with face-to-face time and non-face-to-face time, including preparing to see the patient, ordering tests and/or medications, counseling the patient and coordination of care as outlined above.   Ledell Peoples, MD Department of Hematology/Oncology [facility] Phone: [phone] Pager: [phone] Email: [email or web address]  10/30/2020 11:43 AM

## 2020-11-06 ENCOUNTER — Ambulatory Visit (INDEPENDENT_AMBULATORY_CARE_PROVIDER_SITE_OTHER): Payer: Medicare Other | Admitting: *Deleted

## 2020-11-06 DIAGNOSIS — I513 Intracardiac thrombosis, not elsewhere classified: Secondary | ICD-10-CM | POA: Diagnosis not present

## 2020-11-06 DIAGNOSIS — Z86711 Personal history of pulmonary embolism: Secondary | ICD-10-CM | POA: Diagnosis not present

## 2020-11-06 DIAGNOSIS — Z8673 Personal history of transient ischemic attack (TIA), and cerebral infarction without residual deficits: Secondary | ICD-10-CM | POA: Diagnosis not present

## 2020-11-06 DIAGNOSIS — Z5181 Encounter for therapeutic drug level monitoring: Secondary | ICD-10-CM | POA: Diagnosis not present

## 2020-11-06 LAB — POCT INR: INR: 3.1 — AB (ref 2.0–3.0)

## 2020-11-06 NOTE — Patient Instructions (Signed)
Decrease warfarin to 1/2 tablet daily except 1 tablet on Sundays and Thursdays Be consistent with [employer] Recheck INR on Monday 11/19/20

## 2020-11-13 DIAGNOSIS — R21 Rash and other nonspecific skin eruption: Secondary | ICD-10-CM | POA: Diagnosis not present

## 2020-11-13 DIAGNOSIS — R1904 Left lower quadrant abdominal swelling, mass and lump: Secondary | ICD-10-CM | POA: Diagnosis not present

## 2020-11-19 ENCOUNTER — Ambulatory Visit (INDEPENDENT_AMBULATORY_CARE_PROVIDER_SITE_OTHER): Payer: Medicare Other | Admitting: *Deleted

## 2020-11-19 DIAGNOSIS — I513 Intracardiac thrombosis, not elsewhere classified: Secondary | ICD-10-CM | POA: Diagnosis not present

## 2020-11-19 DIAGNOSIS — Z5181 Encounter for therapeutic drug level monitoring: Secondary | ICD-10-CM | POA: Diagnosis not present

## 2020-11-19 DIAGNOSIS — Z86711 Personal history of pulmonary embolism: Secondary | ICD-10-CM | POA: Diagnosis not present

## 2020-11-19 DIAGNOSIS — Z8673 Personal history of transient ischemic attack (TIA), and cerebral infarction without residual deficits: Secondary | ICD-10-CM

## 2020-11-19 LAB — POCT INR: INR: 2.5 (ref 2.0–3.0)

## 2020-11-19 NOTE — Patient Instructions (Signed)
Continue warfarin 1/2 tablet daily except 1 tablet on Sundays and Thursdays Be consistent with [employer] Recheck INR in 4 wks

## 2020-12-14 DIAGNOSIS — I2510 Atherosclerotic heart disease of native coronary artery without angina pectoris: Secondary | ICD-10-CM | POA: Diagnosis not present

## 2020-12-14 DIAGNOSIS — I10 Essential (primary) hypertension: Secondary | ICD-10-CM | POA: Diagnosis not present

## 2020-12-17 ENCOUNTER — Ambulatory Visit (INDEPENDENT_AMBULATORY_CARE_PROVIDER_SITE_OTHER): Payer: Medicare Other | Admitting: *Deleted

## 2020-12-17 DIAGNOSIS — Z5181 Encounter for therapeutic drug level monitoring: Secondary | ICD-10-CM | POA: Diagnosis not present

## 2020-12-17 DIAGNOSIS — Z8673 Personal history of transient ischemic attack (TIA), and cerebral infarction without residual deficits: Secondary | ICD-10-CM | POA: Diagnosis not present

## 2020-12-17 DIAGNOSIS — Z86711 Personal history of pulmonary embolism: Secondary | ICD-10-CM

## 2020-12-17 DIAGNOSIS — I513 Intracardiac thrombosis, not elsewhere classified: Secondary | ICD-10-CM | POA: Diagnosis not present

## 2020-12-17 LAB — POCT INR: INR: 1.5 — AB (ref 2.0–3.0)

## 2020-12-17 MED ORDER — WARFARIN SODIUM 5 MG PO TABS: ORAL_TABLET | ORAL | 5 refills | Status: DC

## 2020-12-17 NOTE — Patient Instructions (Signed)
Take warfarin 1 tablet tonight and tomorrow night then resume 1/2 tablet daily except 1 tablet on Sundays and Thursdays Be consistent with [employer] Recheck INR in 3 wks

## 2021-01-07 ENCOUNTER — Ambulatory Visit (INDEPENDENT_AMBULATORY_CARE_PROVIDER_SITE_OTHER): Payer: Medicare Other | Admitting: *Deleted

## 2021-01-07 ENCOUNTER — Other Ambulatory Visit: Payer: Self-pay

## 2021-01-07 DIAGNOSIS — I6330 Cerebral infarction due to thrombosis of unspecified cerebral artery: Secondary | ICD-10-CM

## 2021-01-07 DIAGNOSIS — I513 Intracardiac thrombosis, not elsewhere classified: Secondary | ICD-10-CM

## 2021-01-07 DIAGNOSIS — Z5181 Encounter for therapeutic drug level monitoring: Secondary | ICD-10-CM | POA: Diagnosis not present

## 2021-01-07 DIAGNOSIS — Z86711 Personal history of pulmonary embolism: Secondary | ICD-10-CM

## 2021-01-07 DIAGNOSIS — Z8673 Personal history of transient ischemic attack (TIA), and cerebral infarction without residual deficits: Secondary | ICD-10-CM | POA: Diagnosis not present

## 2021-01-07 LAB — POCT INR: INR: 1.9 — AB (ref 2.0–3.0)

## 2021-01-07 NOTE — Patient Instructions (Signed)
Increase warfarin to 1/2 tablet daily except 1 tablet on Sundays, Tuesdays and Thursdays Be consistent with [employer] Recheck INR in 3 wks

## 2021-01-21 DIAGNOSIS — N1831 Chronic kidney disease, stage 3a: Secondary | ICD-10-CM | POA: Diagnosis not present

## 2021-01-21 DIAGNOSIS — I25810 Atherosclerosis of coronary artery bypass graft(s) without angina pectoris: Secondary | ICD-10-CM | POA: Diagnosis not present

## 2021-01-21 DIAGNOSIS — I5022 Chronic systolic (congestive) heart failure: Secondary | ICD-10-CM | POA: Diagnosis not present

## 2021-01-21 DIAGNOSIS — I10 Essential (primary) hypertension: Secondary | ICD-10-CM | POA: Diagnosis not present

## 2021-01-29 ENCOUNTER — Telehealth: Payer: Self-pay | Admitting: Cardiology

## 2021-01-29 ENCOUNTER — Ambulatory Visit (INDEPENDENT_AMBULATORY_CARE_PROVIDER_SITE_OTHER): Payer: Medicare Other | Admitting: *Deleted

## 2021-01-29 DIAGNOSIS — I513 Intracardiac thrombosis, not elsewhere classified: Secondary | ICD-10-CM | POA: Diagnosis not present

## 2021-01-29 DIAGNOSIS — Z8673 Personal history of transient ischemic attack (TIA), and cerebral infarction without residual deficits: Secondary | ICD-10-CM | POA: Diagnosis not present

## 2021-01-29 DIAGNOSIS — Z5181 Encounter for therapeutic drug level monitoring: Secondary | ICD-10-CM | POA: Diagnosis not present

## 2021-01-29 DIAGNOSIS — Z86711 Personal history of pulmonary embolism: Secondary | ICD-10-CM

## 2021-01-29 LAB — POCT INR: INR: 3.5 — AB (ref 2.0–3.0)

## 2021-01-29 NOTE — Patient Instructions (Signed)
Description   Hold warfarin today, then continue taking warfarin to 1/2 tablet daily except 1 tablet on Sundays, Tuesdays and Thursdays Be consistent with [employer] Recheck INR in 3 wks

## 2021-01-29 NOTE — Telephone Encounter (Signed)
Anticoagulation clinic visit today.

## 2021-02-12 ENCOUNTER — Inpatient Hospital Stay (HOSPITAL_COMMUNITY): Payer: Medicare Other | Attending: Hematology

## 2021-02-12 ENCOUNTER — Other Ambulatory Visit: Payer: Self-pay

## 2021-02-12 DIAGNOSIS — Z888 Allergy status to other drugs, medicaments and biological substances status: Secondary | ICD-10-CM | POA: Insufficient documentation

## 2021-02-12 DIAGNOSIS — I2699 Other pulmonary embolism without acute cor pulmonale: Secondary | ICD-10-CM | POA: Diagnosis not present

## 2021-02-12 DIAGNOSIS — R0609 Other forms of dyspnea: Secondary | ICD-10-CM | POA: Insufficient documentation

## 2021-02-12 DIAGNOSIS — Z86711 Personal history of pulmonary embolism: Secondary | ICD-10-CM | POA: Insufficient documentation

## 2021-02-12 DIAGNOSIS — R0602 Shortness of breath: Secondary | ICD-10-CM | POA: Insufficient documentation

## 2021-02-12 DIAGNOSIS — I252 Old myocardial infarction: Secondary | ICD-10-CM | POA: Insufficient documentation

## 2021-02-12 DIAGNOSIS — Z809 Family history of malignant neoplasm, unspecified: Secondary | ICD-10-CM | POA: Diagnosis not present

## 2021-02-12 DIAGNOSIS — Z8673 Personal history of transient ischemic attack (TIA), and cerebral infarction without residual deficits: Secondary | ICD-10-CM | POA: Diagnosis not present

## 2021-02-12 DIAGNOSIS — Z79899 Other long term (current) drug therapy: Secondary | ICD-10-CM | POA: Insufficient documentation

## 2021-02-12 DIAGNOSIS — I2694 Multiple subsegmental pulmonary emboli without acute cor pulmonale: Secondary | ICD-10-CM

## 2021-02-12 DIAGNOSIS — I2510 Atherosclerotic heart disease of native coronary artery without angina pectoris: Secondary | ICD-10-CM | POA: Insufficient documentation

## 2021-02-12 LAB — CBC WITH DIFFERENTIAL/PLATELET
Abs Immature Granulocytes: 0.01 10*3/uL (ref 0.00–0.07)
Basophils Absolute: 0.1 10*3/uL (ref 0.0–0.1)
Basophils Relative: 1 %
Eosinophils Absolute: 0.1 10*3/uL (ref 0.0–0.5)
Eosinophils Relative: 2 %
HCT: 51.8 % (ref 39.0–52.0)
Hemoglobin: 16.2 g/dL (ref 13.0–17.0)
Immature Granulocytes: 0 %
Lymphocytes Relative: 46 %
Lymphs Abs: 3 10*3/uL (ref 0.7–4.0)
MCH: 29 pg (ref 26.0–34.0)
MCHC: 31.3 g/dL (ref 30.0–36.0)
MCV: 92.7 fL (ref 80.0–100.0)
Monocytes Absolute: 0.9 10*3/uL (ref 0.1–1.0)
Monocytes Relative: 14 %
Neutro Abs: 2.4 10*3/uL (ref 1.7–7.7)
Neutrophils Relative %: 37 %
Platelets: 150 10*3/uL (ref 150–400)
RBC: 5.59 MIL/uL (ref 4.22–5.81)
RDW: 13.7 % (ref 11.5–15.5)
WBC: 6.5 10*3/uL (ref 4.0–10.5)
nRBC: 0 % (ref 0.0–0.2)

## 2021-02-12 LAB — COMPREHENSIVE METABOLIC PANEL
ALT: 66 U/L — ABNORMAL HIGH (ref 0–44)
AST: 37 U/L (ref 15–41)
Albumin: 4.2 g/dL (ref 3.5–5.0)
Alkaline Phosphatase: 58 U/L (ref 38–126)
Anion gap: 6 (ref 5–15)
BUN: 13 mg/dL (ref 8–23)
CO2: 28 mmol/L (ref 22–32)
Calcium: 8.9 mg/dL (ref 8.9–10.3)
Chloride: 104 mmol/L (ref 98–111)
Creatinine, Ser: 1.28 mg/dL — ABNORMAL HIGH (ref 0.61–1.24)
GFR, Estimated: 59 mL/min — ABNORMAL LOW (ref >60)
Glucose, Bld: 76 mg/dL (ref 70–99)
Potassium: 4 mmol/L (ref 3.5–5.1)
Sodium: 138 mmol/L (ref 135–145)
Total Bilirubin: 2 mg/dL — ABNORMAL HIGH (ref 0.3–1.2)
Total Protein: 7.7 g/dL (ref 6.5–8.1)

## 2021-02-12 LAB — D-DIMER, QUANTITATIVE: D-Dimer, Quant: 0.28 ug/mL-FEU (ref 0.00–0.50)

## 2021-02-12 LAB — PROTIME-INR
INR: 2.2 — ABNORMAL HIGH (ref 0.8–1.2)
Prothrombin Time: 24.4 seconds — ABNORMAL HIGH (ref 11.4–15.2)

## 2021-02-18 ENCOUNTER — Ambulatory Visit (INDEPENDENT_AMBULATORY_CARE_PROVIDER_SITE_OTHER): Payer: Medicare Other | Admitting: *Deleted

## 2021-02-18 ENCOUNTER — Other Ambulatory Visit: Payer: Self-pay

## 2021-02-18 DIAGNOSIS — I513 Intracardiac thrombosis, not elsewhere classified: Secondary | ICD-10-CM | POA: Diagnosis not present

## 2021-02-18 DIAGNOSIS — Z86711 Personal history of pulmonary embolism: Secondary | ICD-10-CM | POA: Diagnosis not present

## 2021-02-18 DIAGNOSIS — Z5181 Encounter for therapeutic drug level monitoring: Secondary | ICD-10-CM

## 2021-02-18 DIAGNOSIS — I6330 Cerebral infarction due to thrombosis of unspecified cerebral artery: Secondary | ICD-10-CM

## 2021-02-18 DIAGNOSIS — Z8673 Personal history of transient ischemic attack (TIA), and cerebral infarction without residual deficits: Secondary | ICD-10-CM

## 2021-02-18 LAB — POCT INR: INR: 3.2 — AB (ref 2.0–3.0)

## 2021-02-18 NOTE — Progress Notes (Signed)
[place] [address] [place], [place] [address]   CLINIC:  Medical Oncology/Hematology  PCP:  Rosita Fire, MD [place] [id number] [phone]   REASON FOR VISIT:  Follow-up for recurrent pulmonary embolism  PRIOR THERAPY: Eliquis  CURRENT THERAPY: Coumadin  INTERVAL HISTORY:  [name] 75 y.o. male returns for routine follow-up of his recurrent pulmonary embolism.  He was last seen by Dr. Lorenso Courier on 10/30/2020.  At today's visit, he reports feeling well.  No recent hospitalizations, surgeries, or changes in baseline health status.  He continues on Coumadin for treatment of his recurrent PE as well as his history of left ventricular mural thrombus.  He has not had any major bleeding events such as epistaxis, gum bleeding, hematemesis, hematochezia, melena, or hematuria.  He reports some mild dyspnea on exertion when he is exercising, as he tries to walk several miles each day around his apartment complex. He denies any unilateral leg swelling, pain, and erythema.  He has not had any shortness of breath at rest, chest pain, cough, hemoptysis, and palpitations.  He has 100% energy and 100% appetite. He endorses that he is maintaining a stable weight.    REVIEW OF SYSTEMS:  Review of Systems  Constitutional:  Negative for appetite change, chills, diaphoresis, fatigue, fever and unexpected weight change.  HENT:   Negative for lump/mass and nosebleeds.   Eyes:  Negative for eye problems.  Respiratory:  Positive for shortness of breath (With exertion). Negative for cough and hemoptysis.   Cardiovascular:  Negative for chest pain, leg swelling and palpitations.  Gastrointestinal:  Negative for abdominal pain, blood in stool, constipation, diarrhea, nausea and vomiting.  Genitourinary:  Negative for hematuria.   Skin: Negative.   Neurological:  Negative for dizziness, headaches and light-headedness.  Hematological:  Does not  bruise/bleed easily.     PAST MEDICAL/SURGICAL HISTORY:  Past Medical History:  Diagnosis Date   Apical mural thrombus    Arteriosclerotic cardiovascular disease (ASCVD) 1998   Inferior MI->PCI of the RCA in 1998; acute anterior MI in 06/2002->  PCI of RCA and LAD with DES x2 to RCA, residual 80% ostial D1, and 80% mid CX and EF-40%; CABG-2011, LIMA-LAD, SVG to D1, OM1 & OM2; EF of 35-40% in 07/2010   Chronic anticoagulation    Chronic systolic CHF (congestive heart failure) (HCC)    CVA (cerebral vascular accident) ([facility])    HTN (hypertension)    Hyperlipidemia    Keloid    median sternotomy   Pulmonary embolism ([facility]) 03/2010   Substance abuse ([facility])    formerly cocaine   Past Surgical History:  Procedure Laterality Date   COLONOSCOPY N/A 01/22/2017   Procedure: COLONOSCOPY;  Surgeon: Danie Binder, MD;  [facility];  Service: Endoscopy;  Laterality: N/A;  200   CORONARY ARTERY BYPASS GRAFT  03/18/2010   LIMA-LAD, SVG to diagonal, OM1 & OM2   CORONARY STENT INTERVENTION N/A 08/21/2017   Procedure: CORONARY STENT INTERVENTION;  Surgeon: [name], MD;  Location: [facility];  Service: Cardiovascular;  Laterality: N/A;   LEFT HEART CATH AND CORS/GRAFTS ANGIOGRAPHY N/A 08/21/2017   Procedure: LEFT HEART CATH AND CORS/GRAFTS ANGIOGRAPHY;  Surgeon: [name], MD;  Location: [facility];  Service: Cardiovascular;  Laterality: N/A;   LEFT HEART CATH AND CORS/GRAFTS ANGIOGRAPHY N/A 06/21/2018   Procedure: LEFT HEART CATH AND CORS/GRAFTS ANGIOGRAPHY;  Surgeon: Nelva Bush, MD;  Location: [facility];  Service: Cardiovascular;  Laterality: N/A;   POLYPECTOMY  01/22/2017   Procedure: POLYPECTOMY;  Surgeon: Danie Binder, MD;  [facility];  Service: Endoscopy;;  Transverse(CS) and sigmoid colon(HS)   PTCA  06/1996   LAD & RCA   TEE WITHOUT CARDIOVERSION N/A 06/10/2012   Procedure: TRANSESOPHAGEAL ECHOCARDIOGRAM (TEE);  Surgeon: Josue Hector, MD;  [facility];  Service: Cardiovascular;  Laterality: N/A;     SOCIAL HISTORY:  Social History   Socioeconomic History   Marital status: Widowed    Spouse name: Not on file   Number of children: Not on file   Years of education: Not on file   Highest education level: Not on file  Occupational History   Occupation: disability  Tobacco Use   Smoking status: Former    Types: Cigarettes    Quit date: 2004    Years since quitting: 18.8   Smokeless tobacco: Never  Vaping Use   Vaping Use: Never used  Substance and Sexual Activity   Alcohol use: Not Currently    Comment: rare   Drug use: No    Types: Cocaine    Comment: Former cocaine abuse - last use for 51th birthday   Sexual activity: Not Currently  Other Topics Concern   Not on file  Social History Narrative   Not on file   Social Determinants of Health   Financial Resource Strain: Low Risk    Difficulty of Paying Living Expenses: Not very hard  Food Insecurity: No Food Insecurity   Worried About Charity fundraiser in the Last Year: Never true   [place] in the Last Year: Never true  Transportation Needs: No Transportation Needs   Lack of Transportation (Medical): No   Lack of Transportation (Non-Medical): No  Physical Activity: Insufficiently Active   Days of Exercise per Week: 2 days   Minutes of Exercise per Session: 20 min  Stress: No Stress Concern Present   Feeling of Stress : Only a little  Social Connections: Moderately Isolated   Frequency of Communication with Friends and Family: Three times a week   Frequency of Social Gatherings with Friends and Family: Twice a week   Attends Religious Services: 1 to 4 times per year   Active Member of [employer] or Organizations: No   Attends Archivist Meetings: Never   Marital Status: Widowed  Human resources officer Violence: Not At Risk   Fear of Current or Ex-Partner: No   Emotionally Abused: No   Physically Abused: No   Sexually  Abused: No    FAMILY HISTORY:  Family History  Problem Relation Age of Onset   Cancer Mother 77    CURRENT MEDICATIONS:  Outpatient Encounter Medications as of 02/19/2021  Medication Sig   atorvastatin (LIPITOR) 40 MG tablet Take 1 tablet (40 mg total) by mouth daily at 6 PM.   clopidogrel (PLAVIX) 75 MG tablet Take 75 mg by mouth daily.   cycloSPORINE (RESTASIS) 0.05 % ophthalmic emulsion Place 1 drop into both eyes daily as needed (dry eye).    fluticasone (FLONASE) 50 MCG/ACT nasal spray See admin instructions.   furosemide (LASIX) 20 MG tablet TAKE 3 TABLETS BY MOUTH EVERY DAY   hydrALAZINE (APRESOLINE) 25 MG tablet Take 1 tablet (25 mg total) by mouth 3 (three) times daily.   HYDROcodone-acetaminophen (NORCO) 5-325 MG tablet Take 1 tablet by mouth every 6 (six) hours as needed for severe pain. (Patient not taking: Reported on 10/30/2020)  isosorbide mononitrate (IMDUR) 60 MG 24 hr tablet Take 1 tablet (60 mg total) by mouth daily.   metoprolol succinate (TOPROL-XL) 50 MG 24 hr tablet Take 50 mg by mouth daily.   nitroGLYCERIN (NITROSTAT) 0.3 MG SL tablet Place 0.3 mg under the tongue as directed.   nitroGLYCERIN (NITROSTAT) 0.4 MG SL tablet Place 1 tablet (0.4 mg total) under the tongue every 5 (five) minutes as needed for chest pain. up to 3 doses. (Patient not taking: Reported on 10/30/2020)   traMADol (ULTRAM) 50 MG tablet Take 50 mg by mouth 3 (three) times daily.   warfarin (COUMADIN) 5 MG tablet Take 1/2 tablet to 1 tablet daily or as directed   No facility-administered encounter medications on file as of 02/19/2021.    ALLERGIES:  Allergies  Allergen Reactions   Lisinopril Swelling and Other (See Comments)    Mouth and tongue swells     PHYSICAL EXAM:  ECOG PERFORMANCE STATUS: 1 - Symptomatic but completely ambulatory  There were no vitals filed for this visit. There were no vitals filed for this visit. Physical Exam Constitutional:      Appearance: Normal  appearance. He is obese.  HENT:     Head: Normocephalic and atraumatic.     Ears:     Comments: Extremely hard of hearing    Mouth/Throat:     Mouth: Mucous membranes are moist.  Eyes:     Extraocular Movements: Extraocular movements intact.     Pupils: Pupils are equal, round, and reactive to light.  Cardiovascular:     Rate and Rhythm: Normal rate and regular rhythm.     Pulses: Normal pulses.     Heart sounds: Normal heart sounds.  Pulmonary:     Effort: Pulmonary effort is normal.     Breath sounds: Normal breath sounds.  Abdominal:     General: Bowel sounds are normal.     Palpations: Abdomen is soft.     Tenderness: There is no abdominal tenderness.  Musculoskeletal:        General: No swelling.     Right lower leg: No edema.     Left lower leg: No edema.  Lymphadenopathy:     Cervical: No cervical adenopathy.  Skin:    General: Skin is warm and dry.  Neurological:     General: No focal deficit present.     Mental Status: He is alert and oriented to person, place, and time.  Psychiatric:        Mood and Affect: Mood normal.        Behavior: Behavior normal.     LABORATORY DATA:  I have reviewed the labs as listed.  CBC    Component Value Date/Time   WBC 6.5 02/12/2021 1049   RBC 5.59 02/12/2021 1049   HGB 16.2 02/12/2021 1049   HCT 51.8 02/12/2021 1049   PLT 150 02/12/2021 1049   MCV 92.7 02/12/2021 1049   MCH 29.0 02/12/2021 1049   MCHC 31.3 02/12/2021 1049   RDW 13.7 02/12/2021 1049   LYMPHSABS 3.0 02/12/2021 1049   MONOABS 0.9 02/12/2021 1049   EOSABS 0.1 02/12/2021 1049   BASOSABS 0.1 02/12/2021 1049   CMP Latest Ref Rng & Units 02/12/2021 10/19/2020 10/18/2020  Glucose 70 - 99 mg/dL 76 104(H) 121(H)  BUN 8 - 23 mg/dL 13 12 13   Creatinine 0.61 - 1.24 mg/dL 1.28(H) 1.29(H) 1.28(H)  Sodium 135 - 145 mmol/L 138 138 137  Potassium 3.5 - 5.1 mmol/L 4.0 4.3 3.6  Chloride 98 - 111 mmol/L 104 104 105  CO2 22 - 32 mmol/L 28 28 26   Calcium 8.9 - 10.3  mg/dL 8.9 8.6(L) 8.2(L)  Total Protein 6.5 - 8.1 g/dL 7.7 6.4(L) 7.1  Total Bilirubin 0.3 - 1.2 mg/dL 2.0(H) 2.2(H) 1.6(H)  Alkaline Phos 38 - 126 U/L 58 54 70  AST 15 - 41 U/L 37 25 38  ALT 0 - 44 U/L 66(H) 43 56(H)    DIAGNOSTIC IMAGING:  I have independently reviewed the relevant imaging and discussed with the patient.  ASSESSMENT & PLAN: 1.  Recurrent pulmonary embolism - Initial pulmonary embolism on 03/30/2010 after CABG surgery -Per cardiology notes, patient has history of left ventricular mural thrombus, was on chronic anticoagulation with Eliquis - Patient was on Eliquis at the time of recurrent PE - presented to ED on 10/18/2020 with chest tightness and SOB, CTA chest showed age-indeterminate pulmonary artery emboli involving segmental branches of right middle and lower lobe region - Lower extremity Dopplers negative for DVT - Due to failure of Eliquis treatment, patient was transitioned to Coumadin therapy.  Follows at Coumadin clinic for INR checks and dosing adjustments. - Hypercoagulation studies would not change management at this time, as patient should continue indefinite anticoagulation.  Recommend holding hypercoagulable studies at this time. - Most recent labs (02/12/2021): D-dimer 0.28, improved from previous.  INR 2.2.  Normal CBC with Hgb 16.2. - He denies any major bleeding events. - No symptoms of recurrent DVT or PE. - PLAN: Continue indefinite anticoagulation with Coumadin.  Continue follow-up at Coumadin clinic for INR and dosing adjustments. - RTC in 6 months for ongoing risk/benefit assessment of anticoagulation.   PLAN SUMMARY & DISPOSITION: Repeat labs (CBC, D-dimer, INR) and RTC in 6 months. - Continue follow-up with Coumadin clinic.  All questions were answered. The patient knows to call the clinic with any problems, questions or concerns.  Medical decision making: Low  Time spent on visit: I spent 15 minutes counseling the patient face to face. The  total time spent in the appointment was 25 minutes and more than 50% was on counseling.   Harriett Rush, PA-C  02/19/2021 11:13 AM

## 2021-02-18 NOTE — Patient Instructions (Signed)
Decrease warfarin to 1/2 tablet daily except 1 tablet on Sundays and Thursdays Be consistent with Vit K foods Recheck INR in 3 wks

## 2021-02-19 ENCOUNTER — Encounter (HOSPITAL_COMMUNITY): Payer: Self-pay | Admitting: Physician Assistant

## 2021-02-19 ENCOUNTER — Inpatient Hospital Stay (HOSPITAL_BASED_OUTPATIENT_CLINIC_OR_DEPARTMENT_OTHER): Payer: Medicare Other | Admitting: Physician Assistant

## 2021-02-19 VITALS — BP 139/90 | HR 66 | Temp 97.5°F | Resp 20 | Wt 224.9 lb

## 2021-02-19 DIAGNOSIS — I2694 Multiple subsegmental pulmonary emboli without acute cor pulmonale: Secondary | ICD-10-CM

## 2021-02-19 DIAGNOSIS — I2510 Atherosclerotic heart disease of native coronary artery without angina pectoris: Secondary | ICD-10-CM | POA: Diagnosis not present

## 2021-02-19 DIAGNOSIS — Z8673 Personal history of transient ischemic attack (TIA), and cerebral infarction without residual deficits: Secondary | ICD-10-CM | POA: Diagnosis not present

## 2021-02-19 DIAGNOSIS — Z79899 Other long term (current) drug therapy: Secondary | ICD-10-CM | POA: Diagnosis not present

## 2021-02-19 DIAGNOSIS — Z809 Family history of malignant neoplasm, unspecified: Secondary | ICD-10-CM | POA: Diagnosis not present

## 2021-02-19 DIAGNOSIS — R0602 Shortness of breath: Secondary | ICD-10-CM | POA: Diagnosis not present

## 2021-02-19 DIAGNOSIS — Z86711 Personal history of pulmonary embolism: Secondary | ICD-10-CM | POA: Diagnosis not present

## 2021-02-19 DIAGNOSIS — I252 Old myocardial infarction: Secondary | ICD-10-CM | POA: Diagnosis not present

## 2021-02-19 DIAGNOSIS — I2699 Other pulmonary embolism without acute cor pulmonale: Secondary | ICD-10-CM | POA: Diagnosis not present

## 2021-02-19 DIAGNOSIS — Z888 Allergy status to other drugs, medicaments and biological substances status: Secondary | ICD-10-CM | POA: Diagnosis not present

## 2021-02-19 DIAGNOSIS — R0609 Other forms of dyspnea: Secondary | ICD-10-CM | POA: Diagnosis not present

## 2021-02-19 NOTE — Patient Instructions (Signed)
[place] at [facility] Discharge Instructions  You were seen today by Tarri Abernethy PA-C for your history of pulmonary embolism (blood clot in your lungs).  We recommend that you continue to take warfarin (Coumadin), which is a blood thinner that will help to prevent future blood clots.  Please let us know if you experience any major bleeding such as bloody bowel movements, dark black and tarry bowel movements, nosebleeds, or other sources of blood loss.  If you have any signs of bleeding, please seek immediate medical attention.  Please seek immediate medical attention if you experience any signs of another blood clot such as chest pain and difficulty breathing, coughing up blood, or any swelling in one leg but with other leg being normal.  LABS: Return in 6 months for repeat labs  MEDICATIONS: Continue warfarin/Coumadin as directed by Coumadin clinic.  FOLLOW-UP APPOINTMENT: Office visit in 6 months, after labs   Thank you for choosing [place] at [facility] to provide your oncology and hematology care.  To afford each patient quality time with our provider, please arrive at least 15 minutes before your scheduled appointment time.   If you have a lab appointment with the [place] please come in thru the Main Entrance and check in at the main information desk.  You need to re-schedule your appointment should you arrive 10 or more minutes late.  We strive to give you quality time with our providers, and arriving late affects you and other patients whose appointments are after yours.  Also, if you no show three or more times for appointments you may be dismissed from the clinic at the providers discretion.     Again, thank you for choosing [facility].  Our hope is that these requests will decrease the amount of time that you wait before being seen by our physicians.        _____________________________________________________________  Should you have questions after your visit to [facility], please contact our office at [phone] and follow the prompts.  Our office hours are 8:00 a.m. and 4:30 p.m. Monday - Friday.  Please note that voicemails left after 4:00 p.m. may not be returned until the following business day.  We are closed weekends and major holidays.  You do have access to a nurse 24-7, just call the main number to the clinic [phone] and do not press any options, hold on the line and a nurse will answer the phone.    For prescription refill requests, have your pharmacy contact our office and allow 72 hours.    Due to Covid, you will need to wear a mask upon entering the hospital. If you do not have a mask, a mask will be given to you at the Main Entrance upon arrival. For doctor visits, patients may have 1 support person age 71 or older with them. For treatment visits, patients can not have anyone with them due to social distancing guidelines and our immunocompromised population.

## 2021-03-11 ENCOUNTER — Other Ambulatory Visit: Payer: Self-pay

## 2021-03-11 ENCOUNTER — Ambulatory Visit (INDEPENDENT_AMBULATORY_CARE_PROVIDER_SITE_OTHER): Payer: Medicare Other | Admitting: *Deleted

## 2021-03-11 DIAGNOSIS — Z5181 Encounter for therapeutic drug level monitoring: Secondary | ICD-10-CM

## 2021-03-11 DIAGNOSIS — Z86711 Personal history of pulmonary embolism: Secondary | ICD-10-CM | POA: Diagnosis not present

## 2021-03-11 DIAGNOSIS — Z8673 Personal history of transient ischemic attack (TIA), and cerebral infarction without residual deficits: Secondary | ICD-10-CM | POA: Diagnosis not present

## 2021-03-11 DIAGNOSIS — I513 Intracardiac thrombosis, not elsewhere classified: Secondary | ICD-10-CM | POA: Diagnosis not present

## 2021-03-11 LAB — POCT INR: INR: 2.9 (ref 2.0–3.0)

## 2021-03-11 NOTE — Patient Instructions (Signed)
Continue warfarin 1/2 tablet daily except 1 tablet on Sundays and Thursdays Be consistent with [employer] Recheck INR in 4 wks

## 2021-03-23 DIAGNOSIS — I5022 Chronic systolic (congestive) heart failure: Secondary | ICD-10-CM | POA: Diagnosis not present

## 2021-03-23 DIAGNOSIS — I10 Essential (primary) hypertension: Secondary | ICD-10-CM | POA: Diagnosis not present

## 2021-04-01 DIAGNOSIS — H5203 Hypermetropia, bilateral: Secondary | ICD-10-CM | POA: Diagnosis not present

## 2021-04-17 ENCOUNTER — Ambulatory Visit (INDEPENDENT_AMBULATORY_CARE_PROVIDER_SITE_OTHER): Payer: Medicare Other | Admitting: *Deleted

## 2021-04-17 ENCOUNTER — Other Ambulatory Visit: Payer: Self-pay

## 2021-04-17 DIAGNOSIS — Z8673 Personal history of transient ischemic attack (TIA), and cerebral infarction without residual deficits: Secondary | ICD-10-CM

## 2021-04-17 DIAGNOSIS — Z86711 Personal history of pulmonary embolism: Secondary | ICD-10-CM

## 2021-04-17 DIAGNOSIS — Z5181 Encounter for therapeutic drug level monitoring: Secondary | ICD-10-CM

## 2021-04-17 DIAGNOSIS — I513 Intracardiac thrombosis, not elsewhere classified: Secondary | ICD-10-CM | POA: Diagnosis not present

## 2021-04-17 LAB — POCT INR: INR: 2.4 (ref 2.0–3.0)

## 2021-04-17 NOTE — Patient Instructions (Signed)
Continue warfarin 1/2 tablet daily except 1 tablet on Sundays and Thursdays Be consistent with [employer] Recheck INR in 4 wks

## 2021-04-23 DIAGNOSIS — I10 Essential (primary) hypertension: Secondary | ICD-10-CM | POA: Diagnosis not present

## 2021-04-23 DIAGNOSIS — I5022 Chronic systolic (congestive) heart failure: Secondary | ICD-10-CM | POA: Diagnosis not present

## 2021-04-30 ENCOUNTER — Telehealth: Payer: Self-pay | Admitting: Internal Medicine

## 2021-04-30 MED ORDER — WARFARIN SODIUM 5 MG PO TABS: ORAL_TABLET | ORAL | 5 refills | Status: DC

## 2021-04-30 NOTE — Telephone Encounter (Signed)
°*  STAT* If patient is at the pharmacy, call can be transferred to refill team.   1. Which medications need to be refilled? (please list name of each medication and dose if known)  warfarin (COUMADIN) 5 MG tablet  2. Which pharmacy/location (including street and city if local pharmacy) is medication to be sent to? [id number] [place], [place] - [address]  3. Do they need a 30 day or 90 day supply? 30 with refills

## 2021-05-15 ENCOUNTER — Ambulatory Visit (INDEPENDENT_AMBULATORY_CARE_PROVIDER_SITE_OTHER): Payer: Medicare HMO | Admitting: *Deleted

## 2021-05-15 DIAGNOSIS — Z8673 Personal history of transient ischemic attack (TIA), and cerebral infarction without residual deficits: Secondary | ICD-10-CM

## 2021-05-15 DIAGNOSIS — I513 Intracardiac thrombosis, not elsewhere classified: Secondary | ICD-10-CM

## 2021-05-15 DIAGNOSIS — Z86711 Personal history of pulmonary embolism: Secondary | ICD-10-CM | POA: Diagnosis not present

## 2021-05-15 DIAGNOSIS — Z5181 Encounter for therapeutic drug level monitoring: Secondary | ICD-10-CM | POA: Diagnosis not present

## 2021-05-15 LAB — POCT INR: INR: 2.5 (ref 2.0–3.0)

## 2021-05-15 NOTE — Patient Instructions (Signed)
Continue warfarin 1/2 tablet daily except 1 tablet on Sundays and Thursdays Be consistent with Vit K foods Recheck INR in 6 wks

## 2021-05-24 DIAGNOSIS — I5022 Chronic systolic (congestive) heart failure: Secondary | ICD-10-CM | POA: Diagnosis not present

## 2021-05-24 DIAGNOSIS — I10 Essential (primary) hypertension: Secondary | ICD-10-CM | POA: Diagnosis not present

## 2021-05-31 ENCOUNTER — Ambulatory Visit (INDEPENDENT_AMBULATORY_CARE_PROVIDER_SITE_OTHER): Payer: Medicare HMO | Admitting: Internal Medicine

## 2021-05-31 ENCOUNTER — Encounter: Payer: Self-pay | Admitting: Internal Medicine

## 2021-05-31 ENCOUNTER — Other Ambulatory Visit: Payer: Self-pay

## 2021-05-31 VITALS — BP 144/80 | HR 71 | Ht 66.0 in | Wt 228.6 lb

## 2021-05-31 DIAGNOSIS — I10 Essential (primary) hypertension: Secondary | ICD-10-CM | POA: Diagnosis not present

## 2021-05-31 DIAGNOSIS — E782 Mixed hyperlipidemia: Secondary | ICD-10-CM

## 2021-05-31 MED ORDER — EMPAGLIFLOZIN 10 MG PO TABS: 10.0000 mg | ORAL_TABLET | Freq: Every day | ORAL | 11 refills | Status: DC

## 2021-05-31 MED ORDER — SPIRONOLACTONE 25 MG PO TABS: 12.5000 mg | ORAL_TABLET | Freq: Every day | ORAL | 3 refills | Status: DC

## 2021-05-31 NOTE — Patient Instructions (Signed)
Medication Instructions:   Start Aldactone 12.5 mg Daily  Start Jardiance 10 mg Daily   *If you need a refill on your cardiac medications before your next appointment, please call your pharmacy*   Lab Work: Your physician recommends that you return for lab work in: 10 Day   If you have labs (blood work) drawn today and your tests are completely normal, you will receive your results only by: [place] (if you have MyChart) OR A paper copy in the mail If you have any lab test that is abnormal or we need to change your treatment, we will call you to review the results.   Testing/Procedures: NONE    Follow-Up: At [facility], you and your health needs are our priority.  As part of our continuing mission to provide you with exceptional heart care, we have created designated Provider Care Teams.  These Care Teams include your primary Cardiologist (physician) and Advanced Practice Providers (APPs -  Physician Assistants and Nurse Practitioners) who all work together to provide you with the care you need, when you need it.  We recommend signing up for the patient portal called "MyChart".  Sign up information is provided on this After Visit Summary.  MyChart is used to connect with patients for Virtual Visits (Telemedicine).  Patients are able to view lab/test results, encounter notes, upcoming appointments, etc.  Non-urgent messages can be sent to your provider as well.   To learn more about what you can do with MyChart, go to [email or web address].    Your next appointment:   4 week(s)  The format for your next appointment:   In Person  Provider:   [name], MD, Bernerd Pho, PA-C, or Ermalinda Barrios, PA-C    Other Instructions Thank you for choosing [place]!

## 2021-05-31 NOTE — Progress Notes (Signed)
Cardiology Office Note   Date:  05/31/2021   ID:  [name], [name] 07/14/1945, MRN [id number]  PCP:  [name], MD  Cardiologist:   [name], MD    Pt presents for f/u of CAD      History of Present Illness: [name] is a 76 y.o. male with a history of CAD   He is s/p CABG x 4 in 2011.  In May 2019 he had intervention to native RCA.   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    In 2020 he presented with NSTEMI   Had been off of anticoagulation.   Cath showed sever native CAD with occluded prox LAD and LCx   The RCA was patent, patient stents.   The LIMA to LAD; SVG to OM1, SVG to Diag were all patient.   Given presentation there was a question of possible coronary embolism     Treated with heparin    Echo at the time showed LVEF 35 to 40% with akinesis of inferior, septal, apical walls      I saw the patient in June 2022  Doing good at time    In July he was admitted for PE  He is now on  coumadin   Echo in July showed LVEF 30 to 35% with akinesis of the apex, distal infeiror, anteroseptal walls    RV  The patient denies chest pains.   He has some SOB at times   Says he is sleeping ok   Denies dizziness,   no syncope      Current Meds  Medication Sig   atorvastatin (LIPITOR) 40 MG tablet Take 1 tablet (40 mg total) by mouth daily at 6 PM.   clopidogrel (PLAVIX) 75 MG tablet Take 75 mg by mouth daily.   clotrimazole-betamethasone (LOTRISONE) cream APPLY TO AFFECTED AREA TWICE A DAY   cycloSPORINE (RESTASIS) 0.05 % ophthalmic emulsion Place 1 drop into both eyes daily as needed (dry eye).    fluticasone (FLONASE) 50 MCG/ACT nasal spray See admin instructions.   furosemide (LASIX) 20 MG tablet TAKE 3 TABLETS BY MOUTH EVERY DAY   hydrALAZINE (APRESOLINE) 25 MG tablet Take 1 tablet (25 mg total) by mouth 3 (three) times daily.   HYDROcodone-acetaminophen (NORCO) 5-325 MG tablet Take 1 tablet by mouth every 6 (six)  hours as needed for severe pain.   isosorbide mononitrate (IMDUR) 60 MG 24 hr tablet Take 1 tablet (60 mg total) by mouth daily.   metoprolol succinate (TOPROL-XL) 50 MG 24 hr tablet Take 50 mg by mouth daily.   nitroGLYCERIN (NITROSTAT) 0.3 MG SL tablet Place 0.3 mg under the tongue as directed.   nitroGLYCERIN (NITROSTAT) 0.4 MG SL tablet Place 1 tablet (0.4 mg total) under the tongue every 5 (five) minutes as needed for chest pain. up to 3 doses.   traMADol (ULTRAM) 50 MG tablet Take 50 mg by mouth 3 (three) times daily.   warfarin (COUMADIN) 5 MG tablet Take 1/2 tablet to 1 tablet daily or as directed     Allergies:   Lisinopril   Past Medical History:  Diagnosis Date   Apical mural thrombus    Arteriosclerotic cardiovascular disease (ASCVD) 1998   Inferior MI->PCI of the RCA in 1998; acute anterior MI in 06/2002->  PCI of RCA and LAD with DES x2 to RCA, residual 80% ostial D1, and 80% mid CX and EF-40%; CABG-2011, LIMA-LAD,  SVG to D1, OM1 & OM2; EF of 35-40% in 07/2010   Chronic anticoagulation    Chronic systolic CHF (congestive heart failure) (HCC)    CVA (cerebral vascular accident) ([place])    HTN (hypertension)    Hyperlipidemia    Keloid    median sternotomy   Pulmonary embolism ([place]) 03/2010   Substance abuse ([place])    formerly cocaine    Past Surgical History:  Procedure Laterality Date   COLONOSCOPY N/A 01/22/2017   Procedure: COLONOSCOPY;  Surgeon: Danie Binder, MD;  [facility];  Service: Endoscopy;  Laterality: N/A;  200   CORONARY ARTERY BYPASS GRAFT  03/18/2010   LIMA-LAD, SVG to diagonal, OM1 & OM2   CORONARY STENT INTERVENTION N/A 08/21/2017   Procedure: CORONARY STENT INTERVENTION;  Surgeon: [name], MD;  Location: [place] CV LAB;  Service: Cardiovascular;  Laterality: N/A;   LEFT HEART CATH AND CORS/GRAFTS ANGIOGRAPHY N/A 08/21/2017   Procedure: LEFT HEART CATH AND CORS/GRAFTS ANGIOGRAPHY;  Surgeon: [name], MD;  Location: [place] CV LAB;  Service: Cardiovascular;  Laterality: N/A;   LEFT HEART CATH AND CORS/GRAFTS ANGIOGRAPHY N/A 06/21/2018   Procedure: LEFT HEART CATH AND CORS/GRAFTS ANGIOGRAPHY;  Surgeon: Nelva Bush, MD;  Location: [place] CV LAB;  Service: Cardiovascular;  Laterality: N/A;   POLYPECTOMY  01/22/2017   Procedure: POLYPECTOMY;  Surgeon: Danie Binder, MD;  [facility];  Service: Endoscopy;;  Transverse(CS) and sigmoid colon(HS)   PTCA  06/1996   LAD & RCA   TEE WITHOUT CARDIOVERSION N/A 06/10/2012   Procedure: TRANSESOPHAGEAL ECHOCARDIOGRAM (TEE);  Surgeon: Josue Hector, MD;  [facility];  Service: Cardiovascular;  Laterality: N/A;     Social History:  The patient  reports that he quit smoking about 19 years ago. His smoking use included cigarettes. He has never used smokeless tobacco. He reports that he does not currently use alcohol. He reports that he does not use drugs.   Family History:  The patient's family history includes Cancer (age of onset: 65) in his mother.    ROS:  Please see the history of present illness. All other systems are reviewed and  Negative to the above problem except as noted.    PHYSICAL EXAM: VS:  BP (!) 144/80    Pulse 71    Ht 5\' 6"  (1.676 m)    Wt 228 lb 9.6 oz (103.7 kg)    SpO2 98%    BMI 36.90 kg/m   GEN: Obese 76 yo in no acute distress  HEENT: normal  Neck: no JVD,  Cardiac: RRR; no murmurs   No LE edema  Respiratory:  clear to auscultation bilaterally, GI: soft, nontender, nondistended, + BS  No hepatomegaly  MS: no deformity Moving all extremities   Skin: warm and dry, no rash Neuro:  Strength and sensation are intact Psych: euthymic mood, full affect   EKG:  EKG is not ordered today.  Echo  July 2022 1. Left ventricular ejection fraction, by estimation, is 30 to 35%. The left ventricle has normal function. Left ventricular endocardial border not optimally defined to evaluate regional wall motion. Left  ventricular diastolic parameters were normal. The entire apex is akinetic, there is some stasis of blood flow in the apex without clear thrombus. The distal inferior, anteroseptal walls are akinetic. The inferior wall is hypokinetic. 2. Right ventricular systolic function was not well visualized. The right ventricular size is not well visualized. There is moderately elevated  pulmonary artery systolic pressure. 3. The mitral valve is normal in structure. No evidence of mitral valve regurgitation. No evidence of mitral stenosis. 4. The aortic valve is tricuspid. Aortic valve regurgitation is not visualized. No aortic stenosis is present. 5. The inferior vena cava is normal in size with greater than 50% respiratory variabil  Cath; The following studies were reviewed today: Cath March 2020- Conclusions: Severe native coronary artery disease, including occluded proximal LAD and LCx.  RCA with patent mid/distal stents and mild in-stent restenosis. Widely patent LIMA-LAD, SVG-OM1-OM, and SVG-D. Mildly elevated left ventricular filling pressure.   Recommendations: Medical therapy; question coronary embolism with spontaneous lysis.  Plan to restart heparin infusion 2 hours after TR band removal and resumption of apixaban tomorrow AM if no evidence of bleeding. Continue clopidogrel 75 mg daily.  Defer continuing aspirin in the setting of long-term anticoagulation and clopidogrel use. Aggressive secondary prevention.    Lipid Panel    Component Value Date/Time   CHOL 76 07/18/2020 1119   TRIG 83 07/18/2020 1119   HDL 36 (L) 07/18/2020 1119   CHOLHDL 2.1 07/18/2020 1119   VLDL 17 07/18/2020 1119   LDLCALC 23 07/18/2020 1119      Wt Readings from Last 3 Encounters:  05/31/21 228 lb 9.6 oz (103.7 kg)  02/19/21 224 lb 13.9 oz (102 kg)  10/30/20 225 lb 3.2 oz (102.2 kg)      ASSESSMENT AND PLAN:  1  CAD  Pt with severe native dz   Patent grafts   Pt without symptoms of angina   Follow     2  Hx systolic CHF   Echo in July 2022 at time of PE.   RV difficult to see   LVEF reported at 30 to 35%   I have reviewed images  Difficult acoustic windows but I do not think that LV function is as bad as documented. Distal septal and apical walls are akinetic but there walls thicken.   ? In 40s%      He is on isosorbide, hydralazine.  Add spironolactone and jardiance for CHF     3  Hx PE  Keep on coumadin    3  Hx o mural LV thrombus  Akinetic region at apex  Continue anticoagulatoin   4  Hx R brain CVA  2014  Continue anticoagulation     5  HTN    BP is good/fair  Adding low dose aldactone    7  HL  Check lipids with other labs      F/U BMET in 2 wks   Check lipids as well    Current medicines are reviewed at length with the patient today.  The patient does not have concerns regarding medicines.  Signed, [name], MD  05/31/2021 10:46 AM    [address], [place], [place]  [address] Phone: [phone]; Fax: [phone]

## 2021-06-03 ENCOUNTER — Other Ambulatory Visit (HOSPITAL_COMMUNITY)
Admission: RE | Admit: 2021-06-03 | Discharge: 2021-06-03 | Disposition: A | Discharge status: Home / Self Care | Payer: Medicare HMO | Source: Ambulatory Visit | Attending: Internal Medicine | Admitting: Internal Medicine

## 2021-06-03 DIAGNOSIS — E782 Mixed hyperlipidemia: Secondary | ICD-10-CM | POA: Insufficient documentation

## 2021-06-03 DIAGNOSIS — I10 Essential (primary) hypertension: Secondary | ICD-10-CM | POA: Insufficient documentation

## 2021-06-03 LAB — LIPID PANEL
Cholesterol: 88 mg/dL (ref 0–200)
HDL: 37 mg/dL — ABNORMAL LOW (ref >40)
LDL Cholesterol: 27 mg/dL (ref 0–99)
Total CHOL/HDL Ratio: 2.4 RATIO
Triglycerides: 121 mg/dL (ref <150)
VLDL: 24 mg/dL (ref 0–40)

## 2021-06-03 LAB — CBC
HCT: 51.4 % (ref 39.0–52.0)
Hemoglobin: 16.4 g/dL (ref 13.0–17.0)
MCH: 29.9 pg (ref 26.0–34.0)
MCHC: 31.9 g/dL (ref 30.0–36.0)
MCV: 93.8 fL (ref 80.0–100.0)
Platelets: 131 10*3/uL — ABNORMAL LOW (ref 150–400)
RBC: 5.48 MIL/uL (ref 4.22–5.81)
RDW: 13.5 % (ref 11.5–15.5)
WBC: 5.6 10*3/uL (ref 4.0–10.5)
nRBC: 0 % (ref 0.0–0.2)

## 2021-06-03 LAB — BASIC METABOLIC PANEL
Anion gap: 9 (ref 5–15)
BUN: 17 mg/dL (ref 8–23)
CO2: 26 mmol/L (ref 22–32)
Calcium: 9.2 mg/dL (ref 8.9–10.3)
Chloride: 103 mmol/L (ref 98–111)
Creatinine, Ser: 1.45 mg/dL — ABNORMAL HIGH (ref 0.61–1.24)
GFR, Estimated: 51 mL/min — ABNORMAL LOW (ref >60)
Glucose, Bld: 151 mg/dL — ABNORMAL HIGH (ref 70–99)
Potassium: 4 mmol/L (ref 3.5–5.1)
Sodium: 138 mmol/L (ref 135–145)

## 2021-06-05 ENCOUNTER — Telehealth: Payer: Self-pay

## 2021-06-05 DIAGNOSIS — I2510 Atherosclerotic heart disease of native coronary artery without angina pectoris: Secondary | ICD-10-CM

## 2021-06-05 DIAGNOSIS — I429 Cardiomyopathy, unspecified: Secondary | ICD-10-CM

## 2021-06-05 DIAGNOSIS — I25708 Atherosclerosis of coronary artery bypass graft(s), unspecified, with other forms of angina pectoris: Secondary | ICD-10-CM

## 2021-06-05 DIAGNOSIS — I5022 Chronic systolic (congestive) heart failure: Secondary | ICD-10-CM

## 2021-06-05 NOTE — Telephone Encounter (Signed)
Spoke with patient's daughter Kenney Houseman, who is on patient's DPR, patient listening in the background.  ? ?Discussed lab results and Dr. Harrington Challenger wanting to keep patient on spironolactone and to decrease Lasix to 40mg  QD. Also to continue monitoring weights, notifying our office of a 3lb gain overnight or 5lb gain in a week. ? ?Scheduled for F/U labs with BMET and BNP on 06/26/21. ? ?Patient and daughter verbalized understanding. ? ? ?

## 2021-06-05 NOTE — Telephone Encounter (Signed)
-----   Message from Fay Records, MD sent at 06/04/2021  9:35 PM EST ----- ?LIpids are excellent! ?Cr is up a little bit with addition of spironolactone. ?I would recomm cutting lasix to 40 mg per day    ?Keep on spironolactone ?F/U BMET and BNP in 3 wks       Keep check on weights  ?

## 2021-06-20 ENCOUNTER — Emergency Department (HOSPITAL_COMMUNITY)
Admission: EM | Admit: 2021-06-20 | Discharge: 2021-06-20 | Discharge status: Against Medical Advice | Payer: Medicare HMO | Attending: Emergency Medicine | Admitting: Emergency Medicine

## 2021-06-20 ENCOUNTER — Encounter (HOSPITAL_COMMUNITY): Payer: Self-pay

## 2021-06-20 ENCOUNTER — Other Ambulatory Visit: Payer: Self-pay

## 2021-06-20 DIAGNOSIS — Z5321 Procedure and treatment not carried out due to patient leaving prior to being seen by health care provider: Secondary | ICD-10-CM | POA: Insufficient documentation

## 2021-06-20 DIAGNOSIS — R319 Hematuria, unspecified: Secondary | ICD-10-CM | POA: Insufficient documentation

## 2021-06-20 NOTE — ED Notes (Signed)
Pt wife asked this RN how long it was going to be and this RN told pt that we were waiting for rooms to be cleaned. Pt wife stated that he was tired and did not want to wait and that she would take pt to his PCP tomorrow morning. EDP made aware of this and upon coming back to [facility] pt and wife had already left.  ?

## 2021-06-20 NOTE — ED Triage Notes (Signed)
Pt present with blood in urine that started yesterday. Pt is on a blood thinner and pt PCP told pt to come here and be seen. Denies any urinary symptoms.  ?

## 2021-06-21 DIAGNOSIS — I5022 Chronic systolic (congestive) heart failure: Secondary | ICD-10-CM | POA: Diagnosis not present

## 2021-06-21 DIAGNOSIS — I10 Essential (primary) hypertension: Secondary | ICD-10-CM | POA: Diagnosis not present

## 2021-06-25 ENCOUNTER — Ambulatory Visit (INDEPENDENT_AMBULATORY_CARE_PROVIDER_SITE_OTHER): Payer: Medicare HMO | Admitting: *Deleted

## 2021-06-25 DIAGNOSIS — I6330 Cerebral infarction due to thrombosis of unspecified cerebral artery: Secondary | ICD-10-CM

## 2021-06-25 DIAGNOSIS — Z5181 Encounter for therapeutic drug level monitoring: Secondary | ICD-10-CM | POA: Diagnosis not present

## 2021-06-25 DIAGNOSIS — I513 Intracardiac thrombosis, not elsewhere classified: Secondary | ICD-10-CM | POA: Diagnosis not present

## 2021-06-25 DIAGNOSIS — Z86711 Personal history of pulmonary embolism: Secondary | ICD-10-CM

## 2021-06-25 DIAGNOSIS — Z8673 Personal history of transient ischemic attack (TIA), and cerebral infarction without residual deficits: Secondary | ICD-10-CM | POA: Diagnosis not present

## 2021-06-25 LAB — POCT INR: INR: 3.4 — AB (ref 2.0–3.0)

## 2021-06-25 NOTE — Patient Instructions (Signed)
Hold warfarin tonight then continue 1/2 tablet daily except 1 tablet on Sundays and Thursdays ?Be consistent with Vit K foods ?Recheck INR in 4 wks ?

## 2021-06-26 ENCOUNTER — Other Ambulatory Visit (HOSPITAL_COMMUNITY)
Admission: RE | Admit: 2021-06-26 | Discharge: 2021-06-26 | Disposition: A | Discharge status: Home / Self Care | Payer: Medicare HMO | Source: Ambulatory Visit | Attending: Internal Medicine | Admitting: Internal Medicine

## 2021-06-26 ENCOUNTER — Other Ambulatory Visit: Payer: Medicare HMO

## 2021-06-26 DIAGNOSIS — I25708 Atherosclerosis of coronary artery bypass graft(s), unspecified, with other forms of angina pectoris: Secondary | ICD-10-CM | POA: Diagnosis not present

## 2021-06-26 DIAGNOSIS — I2510 Atherosclerotic heart disease of native coronary artery without angina pectoris: Secondary | ICD-10-CM | POA: Diagnosis not present

## 2021-06-26 DIAGNOSIS — I5022 Chronic systolic (congestive) heart failure: Secondary | ICD-10-CM | POA: Insufficient documentation

## 2021-06-26 DIAGNOSIS — I429 Cardiomyopathy, unspecified: Secondary | ICD-10-CM | POA: Diagnosis not present

## 2021-06-26 LAB — BRAIN NATRIURETIC PEPTIDE: B Natriuretic Peptide: 164 pg/mL — ABNORMAL HIGH (ref 0.0–100.0)

## 2021-06-26 LAB — BASIC METABOLIC PANEL
Anion gap: 8 (ref 5–15)
BUN: 16 mg/dL (ref 8–23)
CO2: 25 mmol/L (ref 22–32)
Calcium: 8.8 mg/dL — ABNORMAL LOW (ref 8.9–10.3)
Chloride: 106 mmol/L (ref 98–111)
Creatinine, Ser: 1.48 mg/dL — ABNORMAL HIGH (ref 0.61–1.24)
GFR, Estimated: 49 mL/min — ABNORMAL LOW (ref >60)
Glucose, Bld: 119 mg/dL — ABNORMAL HIGH (ref 70–99)
Potassium: 3.8 mmol/L (ref 3.5–5.1)
Sodium: 139 mmol/L (ref 135–145)

## 2021-06-27 NOTE — Progress Notes (Signed)
? ?Cardiology Office Note   ? ?Date:  06/29/2021  ? ?ID:  [name], DOB 02-27-1946, MRN [id number] ? ?PCP:  [name], MD  ?Cardiologist: [name], MD   ? ?Chief Complaint  ?Patient presents with  ? Follow-up  ?  4 week visit  ? ? ?History of Present Illness:   ? ?[name] is a 76 y.o. male with past medical history of CAD (s/p CABG in 2011, DES to RCA in 08/2017, cath in 06/2018 showing patent LIMA-LAD, SVG-OM1-OM2 and SVG-D1), HFrEF (EF 45-50% in 06/2012, 35-40% in 08/2017 with similar results in 06/2018, at 30-35% in 10/2020), history of PE (on Coumadin), HTN, HLD and history of CVA who presents to the office today for 15-monthfollow-up. ? ?He was last examined by Dr. RHarrington Challengerin 05/2021 and reported intermittent dyspnea on exertion but denied any chest pain or palpitations.  He was continued on Toprol-XL, Hydralazine and Imdur with Spironolactone 12.5 mg daily and Jardiance 10 mg daily being added to his medication regimen. Follow-up labs showed his creatinine had trended up slightly to 1.45 with similar results of 1.48 by recent labs on 06/26/2021 and his BNP was only mildly elevated at 164. ? ?In talking with the patient today, he reports his shortness of breath has improved since his last visit. He has been trying to walk more for exercise in the mornings. He denies any recent chest pain or palpitations. No recent orthopnea, PND or pitting edema. In reviewing his medication regimen, it sounds like he has been taking Spironolactone 25 mg daily instead of 12.5 mg daily. ? ? ?Past Medical History:  ?Diagnosis Date  ? Apical mural thrombus   ? Arteriosclerotic cardiovascular disease (ASCVD)   ? a. s/p CABG in 2011 b. DES to RCA in 08/2017 c. cath in 06/2018 showing patent LIMA-LAD, SVG-OM1-OM2 and SVG-D1  ? Chronic anticoagulation   ? Chronic systolic CH[place] (congestive heart failure) ([place]   ? a. EF 45-50% in 06/2012 b. 35-40% in 08/2017 with similar results in 06/2018 c. EF at 30-35% in  10/2020  ? CV[facility]cere[facility] vascular accident) ([facility]   ? HTN (hypertension)   ? Hyperlipidemia   ? Keloid   ? median sternotomy  ? [place]  ? Substance abuse ([place]   ? formerly cocaine  ? ? ?Past Surgical History:  ?Procedure Laterality Date  ? COLONOSCOPY N/A 01/22/2017  ? Procedure: COLONOSCOPY;  Surgeon: FDanie Binder MD;  [facility];  Service: Endoscopy;  Laterality: N/A;  200  ? CORONARY ARTERY BYPASS GRAFT  03/18/2010  ? LIMA-LAD, SVG to diagonal, OM1 & OM2  ? CORONARY STENT INTERVENTION N/A 08/21/2017  ? Procedure: CORONARY STENT INTERVENTION;  Surgeon: CSherren Mocha MD;  [place];  Service: Cardiovascular;  Laterality: N/A;  ? LEFT HEART CATH AND CORS/GRAFTS ANGIOGRAPHY N/A 08/21/2017  ? Procedure: LEFT HEART CATH AND CORS/GRAFTS ANGIOGRAPHY;  Surgeon: CSherren Mocha MD;  [place];  Service: Cardiovascular;  Laterality: N/A;  ? LEFT HEART CATH AND CORS/GRAFTS ANGIOGRAPHY N/A 06/21/2018  ? Procedure: LEFT HEART CATH AND CORS/GRAFTS ANGIOGRAPHY;  Surgeon: ENelva Bush MD;  [place];  Service: Cardiovascular;  Laterality: N/A;  ? POLYPECTOMY  01/22/2017  ? Procedure: POLYPECTOMY;  Surgeon: FDanie Binder MD;  [facility];  Service: Endoscopy;;  Transverse(CS) and sigmoid colon(HS)  ? PTCA  06/1996  ? LAD & RCA  ? TEE WITHOUT CARDIOVERSION N/A 06/10/2012  ?  Procedure: TRANSESOPHAGEAL ECHOCARDIOGRAM (TEE);  Surgeon: Josue Hector, MD;  [facility];  Service: Cardiovascular;  Laterality: N/A;  ? ? ?Current Medications: ?Outpatient Medications Prior to Visit  ?Medication Sig Dispense Refill  ? atorvastatin (LIPITOR) 40 MG tablet Take 1 tablet (40 mg total) by mouth daily at 6 PM. 30 tablet 0  ? clopidogrel (PLAVIX) 75 MG tablet Take 75 mg by mouth daily.    ? clotrimazole-betamethasone (LOTRISONE) cream APPLY TO AFFECTED AREA TWICE A DAY    ? cycloSPORINE (RESTASIS) 0.05 % ophthalmic  emulsion Place 1 drop into both eyes daily as needed (dry eye).     ? empagliflozin (JARDIANCE) 10 MG TABS tablet Take 1 tablet (10 mg total) by mouth daily before breakfast. 30 tablet 11  ? fluticasone (FLONASE) 50 MCG/ACT nasal spray See admin instructions.    ? furosemide (LASIX) 20 MG tablet TAKE 3 TABLETS BY MOUTH EVERY DAY 270 tablet 3  ? hydrALAZINE (APRESOLINE) 25 MG tablet Take 1 tablet (25 mg total) by mouth 3 (three) times daily. 270 tablet 3  ? HYDROcodone-acetaminophen (NORCO) 5-325 MG tablet Take 1 tablet by mouth every 6 (six) hours as needed for severe pain. 10 tablet 0  ? isosorbide mononitrate (IMDUR) 60 MG 24 hr tablet Take 1 tablet (60 mg total) by mouth daily. 90 tablet 3  ? metoprolol succinate (TOPROL-XL) 50 MG 24 hr tablet Take 50 mg by mouth daily.  3  ? nitroGLYCERIN (NITROSTAT) 0.4 MG SL tablet Place 1 tablet (0.4 mg total) under the tongue every 5 (five) minutes as needed for chest pain. up to 3 doses. 25 tablet 4  ? traMADol (ULTRAM) 50 MG tablet Take 50 mg by mouth 3 (three) times daily.    ? warfarin (COUMADIN) 5 MG tablet Take 1/2 tablet to 1 tablet daily or as directed 30 tablet 5  ? spironolactone (ALDACTONE) 25 MG tablet Take 0.5 tablets (12.5 mg total) by mouth daily. 45 tablet 3  ? nitroGLYCERIN (NITROSTAT) 0.3 MG SL tablet Place 0.3 mg under the tongue as directed.    ? ?No facility-administered medications prior to visit.  ?  ? ?Allergies:   Lisinopril  ? ?Social History  ? ?Socioeconomic History  ? Marital status: Widowed  ?  Spouse name: Not on file  ? Number of children: Not on file  ? Years of education: Not on file  ? Highest education level: Not on file  ?Occupational History  ? Occupation: disability  ?Tobacco Use  ? Smoking status: Former  ?  Types: Cigarettes  ?  Quit date: 2004  ?  Years since quitting: 19.2  ? Smokeless tobacco: Never  ?Vaping Use  ? Vaping Use: Never used  ?Substance and Sexual Activity  ? Alcohol use: Not Currently  ?  Comment: rare  ? Drug use: No   ?  Types: Cocaine  ?  Comment: Former cocaine abuse - last use for 51th birthday  ? Sexual activity: Not Currently  ?Other Topics Concern  ? Not on file  ?Social History Narrative  ? Not on file  ? ?Social Determinants of Health  ? ?Financial Resource Strain: Low Risk   ? Difficulty of Paying Living Expenses: Not very hard  ?Food Insecurity: No Food Insecurity  ? Worried About Charity fundraiser in the Last Year: Never true  ? Ran Out of Food in the Last Year: Never true  ?Transportation Needs: No Transportation Needs  ? Lack of Transportation (Medical): No  ? Lack  of Transportation (Non-Medical): No  ?Physical Activity: Insufficiently Active  ? Days of Exercise per Week: 2 days  ? Minutes of Exercise per Session: 20 min  ?Stress: No Stress Concern Present  ? Feeling of Stress : Only a little  ?Social Connections: Moderately Isolated  ? Frequency of Communication with Friends and Family: Three times a week  ? Frequency of Social Gatherings with Friends and Family: Twice a week  ? Attends Religious Services: 1 to 4 times per year  ? Active Member of Clubs or Organizations: No  ? Attends Archivist Meetings: Never  ? Marital Status: Widowed  ?  ? ?Family History:  The patient's family history includes Cancer (age of onset: 24) in his mother.  ? ?Review of Systems:   ? ?Please see the history of present illness.    ? ?All other systems reviewed and are otherwise negative except as noted above. ? ? ?Physical Exam:   ? ?VS:  BP 118/70   Pulse 65   Ht '5\' 6"'$  (1.676 m)   Wt 225 lb 9.6 oz (102.3 kg)   SpO2 96%   BMI 36.41 kg/m?    ?General: Pleasant elderly male appearing in no acute distress. ?Head: Normocephalic, atraumatic. ?Neck: No carotid bruits. JVD not elevated.  ?Lungs: Respirations regular and unlabored, without wheezes or rales.  ?Heart: Regular rate and rhythm. No S3 or S4.  No murmur, no rubs, or gallops appreciated. ?Abdomen: Appears non-distended. No obvious abdominal masses. ?Msk:  Strength  and tone appear normal for age. No obvious joint deformities or effusions. ?Extremities: No clubbing or cyanosis. No pitting edema.  Distal pedal pulses are 2+ bilaterally. ?Neuro: Alert and oriented X 3. Moves a

## 2021-06-28 ENCOUNTER — Ambulatory Visit (INDEPENDENT_AMBULATORY_CARE_PROVIDER_SITE_OTHER): Payer: Medicare HMO | Admitting: Student

## 2021-06-28 ENCOUNTER — Encounter: Payer: Self-pay | Admitting: Student

## 2021-06-28 VITALS — BP 118/70 | HR 65 | Ht 66.0 in | Wt 225.6 lb

## 2021-06-28 DIAGNOSIS — E785 Hyperlipidemia, unspecified: Secondary | ICD-10-CM | POA: Diagnosis not present

## 2021-06-28 DIAGNOSIS — I10 Essential (primary) hypertension: Secondary | ICD-10-CM | POA: Diagnosis not present

## 2021-06-28 DIAGNOSIS — I5022 Chronic systolic (congestive) heart failure: Secondary | ICD-10-CM | POA: Diagnosis not present

## 2021-06-28 DIAGNOSIS — I2510 Atherosclerotic heart disease of native coronary artery without angina pectoris: Secondary | ICD-10-CM

## 2021-06-28 DIAGNOSIS — N1831 Chronic kidney disease, stage 3a: Secondary | ICD-10-CM | POA: Diagnosis not present

## 2021-06-28 MED ORDER — SPIRONOLACTONE 25 MG PO TABS: 25.0000 mg | ORAL_TABLET | Freq: Every day | ORAL | 3 refills | Status: DC

## 2021-06-28 NOTE — Patient Instructions (Signed)
Medication Instructions:  ?Your physician recommends that you continue on your current medications as directed. Please refer to the Current Medication list given to you today. ? ?Labwork: ?none ? ?Testing/Procedures: ?Your physician has requested that you have a limited echocardiogram in 2-3 months Echocardiography is a painless test that uses sound waves to create images of your heart. It provides your doctor with information about the size and shape of your heart and how well your heart?s chambers and valves are working. This procedure takes approximately one hour. There are no restrictions for this procedure. ? ?Follow-Up: ?Your physician recommends that you schedule a follow-up appointment in: 3 months with Dr. Harrington Challenger ? ?Any Other Special Instructions Will Be Listed Below (If Applicable). ? ?If you need a refill on your cardiac medications before your next appointment, please call your pharmacy. ?

## 2021-06-29 ENCOUNTER — Encounter: Payer: Self-pay | Admitting: Student

## 2021-07-02 ENCOUNTER — Telehealth: Payer: Self-pay | Admitting: Internal Medicine

## 2021-07-02 NOTE — Telephone Encounter (Signed)
Patient's daughter Kenney Houseman is calling stating she is returning a call the patient received yesterday. ?

## 2021-07-02 NOTE — Telephone Encounter (Signed)
Spoke to pt's daughter and verbalized upcoming Echo appt in May. Daughter voiced understanding  ?

## 2021-07-17 ENCOUNTER — Other Ambulatory Visit (HOSPITAL_COMMUNITY): Payer: Medicare HMO

## 2021-07-17 ENCOUNTER — Other Ambulatory Visit (HOSPITAL_COMMUNITY)
Admission: RE | Admit: 2021-07-17 | Discharge: 2021-07-17 | Disposition: A | Discharge status: Home / Self Care | Payer: 59 | Source: Ambulatory Visit | Attending: Internal Medicine | Admitting: Internal Medicine

## 2021-07-17 DIAGNOSIS — Z0001 Encounter for general adult medical examination with abnormal findings: Secondary | ICD-10-CM | POA: Insufficient documentation

## 2021-07-17 DIAGNOSIS — N1831 Chronic kidney disease, stage 3a: Secondary | ICD-10-CM | POA: Diagnosis present

## 2021-07-17 DIAGNOSIS — I2694 Multiple subsegmental pulmonary emboli without acute cor pulmonale: Secondary | ICD-10-CM | POA: Insufficient documentation

## 2021-07-17 DIAGNOSIS — I10 Essential (primary) hypertension: Secondary | ICD-10-CM | POA: Diagnosis present

## 2021-07-17 LAB — HEPATIC FUNCTION PANEL
ALT: 40 U/L (ref 0–44)
AST: 28 U/L (ref 15–41)
Albumin: 4.1 g/dL (ref 3.5–5.0)
Alkaline Phosphatase: 58 U/L (ref 38–126)
Bilirubin, Direct: 0.2 mg/dL (ref 0.0–0.2)
Indirect Bilirubin: 1.9 mg/dL — ABNORMAL HIGH (ref 0.3–0.9)
Total Bilirubin: 2.1 mg/dL — ABNORMAL HIGH (ref 0.3–1.2)
Total Protein: 7.7 g/dL (ref 6.5–8.1)

## 2021-07-17 LAB — CBC WITH DIFFERENTIAL/PLATELET
Abs Immature Granulocytes: 0.01 10*3/uL (ref 0.00–0.07)
Basophils Absolute: 0 10*3/uL (ref 0.0–0.1)
Basophils Relative: 1 %
Eosinophils Absolute: 0.2 10*3/uL (ref 0.0–0.5)
Eosinophils Relative: 4 %
HCT: 50.4 % (ref 39.0–52.0)
Hemoglobin: 15.9 g/dL (ref 13.0–17.0)
Immature Granulocytes: 0 %
Lymphocytes Relative: 31 %
Lymphs Abs: 1.8 10*3/uL (ref 0.7–4.0)
MCH: 28.8 pg (ref 26.0–34.0)
MCHC: 31.5 g/dL (ref 30.0–36.0)
MCV: 91.1 fL (ref 80.0–100.0)
Monocytes Absolute: 0.4 10*3/uL (ref 0.1–1.0)
Monocytes Relative: 7 %
Neutro Abs: 3.5 10*3/uL (ref 1.7–7.7)
Neutrophils Relative %: 57 %
Platelets: 152 10*3/uL (ref 150–400)
RBC: 5.53 MIL/uL (ref 4.22–5.81)
RDW: 13.7 % (ref 11.5–15.5)
WBC: 6 10*3/uL (ref 4.0–10.5)
nRBC: 0 % (ref 0.0–0.2)

## 2021-07-17 LAB — BASIC METABOLIC PANEL
Anion gap: 8 (ref 5–15)
BUN: 12 mg/dL (ref 8–23)
CO2: 27 mmol/L (ref 22–32)
Calcium: 9.1 mg/dL (ref 8.9–10.3)
Chloride: 104 mmol/L (ref 98–111)
Creatinine, Ser: 1.44 mg/dL — ABNORMAL HIGH (ref 0.61–1.24)
GFR, Estimated: 51 mL/min — ABNORMAL LOW (ref >60)
Glucose, Bld: 159 mg/dL — ABNORMAL HIGH (ref 70–99)
Potassium: 3.7 mmol/L (ref 3.5–5.1)
Sodium: 139 mmol/L (ref 135–145)

## 2021-07-17 LAB — LIPID PANEL
Cholesterol: 82 mg/dL (ref 0–200)
HDL: 37 mg/dL — ABNORMAL LOW (ref >40)
LDL Cholesterol: 26 mg/dL (ref 0–99)
Total CHOL/HDL Ratio: 2.2 RATIO
Triglycerides: 93 mg/dL (ref <150)
VLDL: 19 mg/dL (ref 0–40)

## 2021-07-18 LAB — HCV AB W REFLEX TO QUANT PCR: HCV Ab: NONREACTIVE

## 2021-07-18 LAB — HCV INTERPRETATION

## 2021-07-23 ENCOUNTER — Ambulatory Visit (INDEPENDENT_AMBULATORY_CARE_PROVIDER_SITE_OTHER): Payer: 59 | Admitting: *Deleted

## 2021-07-23 DIAGNOSIS — Z5181 Encounter for therapeutic drug level monitoring: Secondary | ICD-10-CM

## 2021-07-23 DIAGNOSIS — I6330 Cerebral infarction due to thrombosis of unspecified cerebral artery: Secondary | ICD-10-CM

## 2021-07-23 DIAGNOSIS — I513 Intracardiac thrombosis, not elsewhere classified: Secondary | ICD-10-CM

## 2021-07-23 DIAGNOSIS — Z86711 Personal history of pulmonary embolism: Secondary | ICD-10-CM | POA: Diagnosis not present

## 2021-07-23 DIAGNOSIS — Z8673 Personal history of transient ischemic attack (TIA), and cerebral infarction without residual deficits: Secondary | ICD-10-CM

## 2021-07-23 LAB — POCT INR: INR: 1.8 — AB (ref 2.0–3.0)

## 2021-07-23 NOTE — Patient Instructions (Signed)
Take warfarin 1 tablet tonight then resume 1/2 tablet daily except 1 tablet on Sundays and Thursdays ?Be consistent with Vit K foods ?Recheck INR in 4 wks ?

## 2021-08-19 ENCOUNTER — Inpatient Hospital Stay (HOSPITAL_COMMUNITY): Payer: 59 | Attending: Hematology

## 2021-08-19 DIAGNOSIS — Z8673 Personal history of transient ischemic attack (TIA), and cerebral infarction without residual deficits: Secondary | ICD-10-CM | POA: Insufficient documentation

## 2021-08-19 DIAGNOSIS — Z888 Allergy status to other drugs, medicaments and biological substances status: Secondary | ICD-10-CM | POA: Diagnosis not present

## 2021-08-19 DIAGNOSIS — Z79899 Other long term (current) drug therapy: Secondary | ICD-10-CM | POA: Diagnosis not present

## 2021-08-19 DIAGNOSIS — Z809 Family history of malignant neoplasm, unspecified: Secondary | ICD-10-CM | POA: Insufficient documentation

## 2021-08-19 DIAGNOSIS — Z87891 Personal history of nicotine dependence: Secondary | ICD-10-CM | POA: Diagnosis not present

## 2021-08-19 DIAGNOSIS — R5383 Other fatigue: Secondary | ICD-10-CM | POA: Insufficient documentation

## 2021-08-19 DIAGNOSIS — I10 Essential (primary) hypertension: Secondary | ICD-10-CM | POA: Diagnosis not present

## 2021-08-19 DIAGNOSIS — E785 Hyperlipidemia, unspecified: Secondary | ICD-10-CM | POA: Diagnosis not present

## 2021-08-19 DIAGNOSIS — R0602 Shortness of breath: Secondary | ICD-10-CM | POA: Diagnosis not present

## 2021-08-19 DIAGNOSIS — Z86711 Personal history of pulmonary embolism: Secondary | ICD-10-CM | POA: Diagnosis not present

## 2021-08-19 DIAGNOSIS — R519 Headache, unspecified: Secondary | ICD-10-CM | POA: Diagnosis not present

## 2021-08-19 DIAGNOSIS — R319 Hematuria, unspecified: Secondary | ICD-10-CM | POA: Diagnosis not present

## 2021-08-19 DIAGNOSIS — R0609 Other forms of dyspnea: Secondary | ICD-10-CM | POA: Insufficient documentation

## 2021-08-19 DIAGNOSIS — Z7901 Long term (current) use of anticoagulants: Secondary | ICD-10-CM | POA: Insufficient documentation

## 2021-08-19 DIAGNOSIS — I2510 Atherosclerotic heart disease of native coronary artery without angina pectoris: Secondary | ICD-10-CM | POA: Insufficient documentation

## 2021-08-19 LAB — COMPREHENSIVE METABOLIC PANEL
ALT: 45 U/L — ABNORMAL HIGH (ref 0–44)
AST: 31 U/L (ref 15–41)
Albumin: 4.2 g/dL (ref 3.5–5.0)
Alkaline Phosphatase: 58 U/L (ref 38–126)
Anion gap: 6 (ref 5–15)
BUN: 15 mg/dL (ref 8–23)
CO2: 24 mmol/L (ref 22–32)
Calcium: 8.9 mg/dL (ref 8.9–10.3)
Chloride: 107 mmol/L (ref 98–111)
Creatinine, Ser: 1.49 mg/dL — ABNORMAL HIGH (ref 0.61–1.24)
GFR, Estimated: 49 mL/min — ABNORMAL LOW (ref >60)
Glucose, Bld: 102 mg/dL — ABNORMAL HIGH (ref 70–99)
Potassium: 3.8 mmol/L (ref 3.5–5.1)
Sodium: 137 mmol/L (ref 135–145)
Total Bilirubin: 1.8 mg/dL — ABNORMAL HIGH (ref 0.3–1.2)
Total Protein: 7.5 g/dL (ref 6.5–8.1)

## 2021-08-19 LAB — CBC WITH DIFFERENTIAL/PLATELET
Abs Immature Granulocytes: 0.01 10*3/uL (ref 0.00–0.07)
Basophils Absolute: 0 10*3/uL (ref 0.0–0.1)
Basophils Relative: 1 %
Eosinophils Absolute: 0.1 10*3/uL (ref 0.0–0.5)
Eosinophils Relative: 2 %
HCT: 49.7 % (ref 39.0–52.0)
Hemoglobin: 15.9 g/dL (ref 13.0–17.0)
Immature Granulocytes: 0 %
Lymphocytes Relative: 45 %
Lymphs Abs: 2.5 10*3/uL (ref 0.7–4.0)
MCH: 29.1 pg (ref 26.0–34.0)
MCHC: 32 g/dL (ref 30.0–36.0)
MCV: 90.9 fL (ref 80.0–100.0)
Monocytes Absolute: 0.6 10*3/uL (ref 0.1–1.0)
Monocytes Relative: 11 %
Neutro Abs: 2.3 10*3/uL (ref 1.7–7.7)
Neutrophils Relative %: 41 %
Platelets: 149 10*3/uL — ABNORMAL LOW (ref 150–400)
RBC: 5.47 MIL/uL (ref 4.22–5.81)
RDW: 13.7 % (ref 11.5–15.5)
WBC: 5.6 10*3/uL (ref 4.0–10.5)
nRBC: 0 % (ref 0.0–0.2)

## 2021-08-19 LAB — PROTIME-INR
INR: 2.8 — ABNORMAL HIGH (ref 0.8–1.2)
Prothrombin Time: 29.6 seconds — ABNORMAL HIGH (ref 11.4–15.2)

## 2021-08-19 LAB — D-DIMER, QUANTITATIVE: D-Dimer, Quant: <0.27 ug/mL-FEU (ref 0.00–0.50)

## 2021-08-20 ENCOUNTER — Ambulatory Visit (INDEPENDENT_AMBULATORY_CARE_PROVIDER_SITE_OTHER): Payer: 59 | Admitting: *Deleted

## 2021-08-20 DIAGNOSIS — I6330 Cerebral infarction due to thrombosis of unspecified cerebral artery: Secondary | ICD-10-CM

## 2021-08-20 DIAGNOSIS — I513 Intracardiac thrombosis, not elsewhere classified: Secondary | ICD-10-CM

## 2021-08-20 DIAGNOSIS — Z8673 Personal history of transient ischemic attack (TIA), and cerebral infarction without residual deficits: Secondary | ICD-10-CM | POA: Diagnosis not present

## 2021-08-20 DIAGNOSIS — Z5181 Encounter for therapeutic drug level monitoring: Secondary | ICD-10-CM

## 2021-08-20 DIAGNOSIS — Z86711 Personal history of pulmonary embolism: Secondary | ICD-10-CM | POA: Diagnosis not present

## 2021-08-20 LAB — POCT INR: INR: 3.3 — AB (ref 2.0–3.0)

## 2021-08-20 NOTE — Patient Instructions (Signed)
Hold warfarin tonight then resume 1 tablet tonight then resume 1/2 tablet daily except 1 tablet on Sundays and Thursdays ?Be consistent with Vit K foods ?Recheck INR in 4 wks ?

## 2021-08-25 NOTE — Progress Notes (Signed)
[place], [place] [address]   CLINIC:  Medical Oncology/Hematology  PCP:  [name], MD [place] [id number] [phone]   REASON FOR VISIT:  Follow-up for recurrent PE   PRIOR THERAPY: Eliquis  CURRENT THERAPY: Warfarin  INTERVAL HISTORY:  [name] 76 y.o. male returns for routine follow-up of his recurrent PE and chronic anticoagulation with warfarin.  He was last seen by Tarri Abernethy PA-C on 02/19/2021.  At today's visit, he reports feeling well.  No recent hospitalizations, surgeries, or changes in baseline health status.  He continues on Coumadin for treatment of his recurrent PE as well as his history of left ventricular mural thrombus.  He reports a single episode of reddish urine 2 months ago, but has not had any recurrent hematuria.  He denies any epistaxis, gum bleeding, hematemesis, hematochezia, or melena.   He reports some mild dyspnea on exertion when he is exercising, as he tries to walk several miles each day around his apartment complex.  He denies any unilateral leg swelling, pain, and erythema.  He has not had any shortness of breath at rest, chest pain, cough, hemoptysis, and palpitations.  He continues to follow with cardiology for his history of LV mural thrombus, which is also being treated with warfarin.  He has 70% energy and 75% appetite. He endorses that he is maintaining a stable weight.   REVIEW OF SYSTEMS:  Review of Systems  Constitutional:  Positive for fatigue. Negative for appetite change, chills, diaphoresis, fever and unexpected weight change.  HENT:   Negative for lump/mass and nosebleeds.   Eyes:  Negative for eye problems.  Respiratory:  Positive for shortness of breath (With exertion, mild). Negative for cough and hemoptysis.   Cardiovascular:  Negative for chest pain, leg swelling and palpitations.  Gastrointestinal:  Negative for abdominal pain,  blood in stool, constipation, diarrhea, nausea and vomiting.  Genitourinary:  Positive for hematuria (Single episode reddish urine 2 months ago, no recurrent hematuria).   Skin: Negative.   Neurological:  Positive for headaches (Occasional). Negative for dizziness and light-headedness.  Hematological:  Does not bruise/bleed easily.     PAST MEDICAL/SURGICAL HISTORY:  Past Medical History:  Diagnosis Date   Apical mural thrombus    Arteriosclerotic cardiovascular disease (ASCVD)    a. s/p CABG in 2011 b. DES to RCA in 08/2017 c. cath in 06/2018 showing patent LIMA-LAD, SVG-OM1-OM2 and SVG-D1   Chronic anticoagulation    Chronic systolic CHF (congestive heart failure) ([place])    a. EF 45-50% in 06/2012 b. 35-40% in 08/2017 with similar results in 06/2018 c. EF at 30-35% in 10/2020   CVA (cerebral vascular accident) ([place])    HTN (hypertension)    Hyperlipidemia    Keloid    median sternotomy   Pulmonary embolism ([place]) 03/2010   Substance abuse ([place])    formerly cocaine   Past Surgical History:  Procedure Laterality Date   COLONOSCOPY N/A 01/22/2017   Procedure: COLONOSCOPY;  Surgeon: Danie Binder, MD;  [facility];  Service: Endoscopy;  Laterality: N/A;  200   CORONARY ARTERY BYPASS GRAFT  03/18/2010   LIMA-LAD, SVG to diagonal, OM1 & OM2   CORONARY STENT INTERVENTION N/A 08/21/2017   Procedure: CORONARY STENT INTERVENTION;  Surgeon: [name], MD;  Location: [facility];  Service: Cardiovascular;  Laterality: N/A;   LEFT HEART CATH AND CORS/GRAFTS ANGIOGRAPHY N/A 08/21/2017   Procedure: LEFT HEART  CATH AND CORS/GRAFTS ANGIOGRAPHY;  Surgeon: [name], MD;  Location: [facility];  Service: Cardiovascular;  Laterality: N/A;   LEFT HEART CATH AND CORS/GRAFTS ANGIOGRAPHY N/A 06/21/2018   Procedure: LEFT HEART CATH AND CORS/GRAFTS ANGIOGRAPHY;  Surgeon: Nelva Bush, MD;  Location: [facility];  Service: Cardiovascular;  Laterality: N/A;    POLYPECTOMY  01/22/2017   Procedure: POLYPECTOMY;  Surgeon: Danie Binder, MD;  [facility];  Service: Endoscopy;;  Transverse(CS) and sigmoid colon(HS)   PTCA  06/1996   LAD & RCA   TEE WITHOUT CARDIOVERSION N/A 06/10/2012   Procedure: TRANSESOPHAGEAL ECHOCARDIOGRAM (TEE);  Surgeon: Josue Hector, MD;  [facility];  Service: Cardiovascular;  Laterality: N/A;     SOCIAL HISTORY:  Social History   Socioeconomic History   Marital status: Widowed    Spouse name: Not on file   Number of children: Not on file   Years of education: Not on file   Highest education level: Not on file  Occupational History   Occupation: disability  Tobacco Use   Smoking status: Former    Types: Cigarettes    Quit date: 2004    Years since quitting: 19.3   Smokeless tobacco: Never  Vaping Use   Vaping Use: Never used  Substance and Sexual Activity   Alcohol use: Not Currently    Comment: rare   Drug use: No    Types: Cocaine    Comment: Former cocaine abuse - last use for 51th birthday   Sexual activity: Not Currently  Other Topics Concern   Not on file  Social History Narrative   Not on file   Social Determinants of Health   Financial Resource Strain: Low Risk    Difficulty of Paying Living Expenses: Not very hard  Food Insecurity: No Food Insecurity   Worried About Charity fundraiser in the Last Year: Never true   [place] in the Last Year: Never true  Transportation Needs: No Transportation Needs   Lack of Transportation (Medical): No   Lack of Transportation (Non-Medical): No  Physical Activity: Insufficiently Active   Days of Exercise per Week: 2 days   Minutes of Exercise per Session: 20 min  Stress: No Stress Concern Present   Feeling of Stress : Only a little  Social Connections: Moderately Isolated   Frequency of Communication with Friends and Family: Three times a week   Frequency of Social Gatherings with Friends and Family: Twice a week    Attends Religious Services: 1 to 4 times per year   Active Member of [employer] or Organizations: No   Attends Archivist Meetings: Never   Marital Status: Widowed  Human resources officer Violence: Not At Risk   Fear of Current or Ex-Partner: No   Emotionally Abused: No   Physically Abused: No   Sexually Abused: No    FAMILY HISTORY:  Family History  Problem Relation Age of Onset   Cancer Mother 67    CURRENT MEDICATIONS:  Outpatient Encounter Medications as of 08/26/2021  Medication Sig   atorvastatin (LIPITOR) 40 MG tablet Take 1 tablet (40 mg total) by mouth daily at 6 PM.   clopidogrel (PLAVIX) 75 MG tablet Take 75 mg by mouth daily.   clotrimazole-betamethasone (LOTRISONE) cream APPLY TO AFFECTED AREA TWICE A DAY   cycloSPORINE (RESTASIS) 0.05 % ophthalmic emulsion Place 1 drop into both eyes daily as needed (dry eye).    empagliflozin (JARDIANCE) 10 MG TABS  tablet Take 1 tablet (10 mg total) by mouth daily before breakfast.   fluticasone (FLONASE) 50 MCG/ACT nasal spray See admin instructions.   furosemide (LASIX) 20 MG tablet TAKE 3 TABLETS BY MOUTH EVERY DAY   hydrALAZINE (APRESOLINE) 25 MG tablet Take 1 tablet (25 mg total) by mouth 3 (three) times daily.   HYDROcodone-acetaminophen (NORCO) 5-325 MG tablet Take 1 tablet by mouth every 6 (six) hours as needed for severe pain.   isosorbide mononitrate (IMDUR) 60 MG 24 hr tablet Take 1 tablet (60 mg total) by mouth daily.   metoprolol succinate (TOPROL-XL) 50 MG 24 hr tablet Take 50 mg by mouth daily.   nitroGLYCERIN (NITROSTAT) 0.4 MG SL tablet Place 1 tablet (0.4 mg total) under the tongue every 5 (five) minutes as needed for chest pain. up to 3 doses.   spironolactone (ALDACTONE) 25 MG tablet Take 1 tablet (25 mg total) by mouth daily.   traMADol (ULTRAM) 50 MG tablet Take 50 mg by mouth 3 (three) times daily.   warfarin (COUMADIN) 5 MG tablet Take 1/2 tablet to 1 tablet daily or as directed   No facility-administered  encounter medications on file as of 08/26/2021.    ALLERGIES:  Allergies  Allergen Reactions   Lisinopril Swelling and Other (See Comments)    Mouth and tongue swells     PHYSICAL EXAM:  ECOG PERFORMANCE STATUS: 1 - Symptomatic but completely ambulatory  There were no vitals filed for this visit. There were no vitals filed for this visit. Physical Exam Constitutional:      Appearance: Normal appearance. He is obese.  HENT:     Head: Normocephalic and atraumatic.     Ears:     Comments: Extremely hard of hearing    Mouth/Throat:     Mouth: Mucous membranes are moist.  Eyes:     Extraocular Movements: Extraocular movements intact.     Pupils: Pupils are equal, round, and reactive to light.  Cardiovascular:     Rate and Rhythm: Normal rate and regular rhythm.     Pulses: Normal pulses.     Heart sounds: Normal heart sounds.  Pulmonary:     Effort: Pulmonary effort is normal.     Breath sounds: Normal breath sounds.  Abdominal:     General: Bowel sounds are normal.     Palpations: Abdomen is soft.     Tenderness: There is no abdominal tenderness.  Musculoskeletal:        General: No swelling.     Right lower leg: No edema.     Left lower leg: No edema.  Lymphadenopathy:     Cervical: No cervical adenopathy.  Skin:    General: Skin is warm and dry.  Neurological:     General: No focal deficit present.     Mental Status: He is alert and oriented to person, place, and time.  Psychiatric:        Mood and Affect: Mood normal.        Behavior: Behavior normal.     LABORATORY DATA:  I have reviewed the labs as listed.  CBC    Component Value Date/Time   WBC 5.6 08/19/2021 1225   RBC 5.47 08/19/2021 1225   HGB 15.9 08/19/2021 1225   HCT 49.7 08/19/2021 1225   PLT 149 (L) 08/19/2021 1225   MCV 90.9 08/19/2021 1225   MCH 29.1 08/19/2021 1225   MCHC 32.0 08/19/2021 1225   RDW 13.7 08/19/2021 1225   LYMPHSABS 2.5 08/19/2021 1225  MONOABS 0.6 08/19/2021 1225    EOSABS 0.1 08/19/2021 1225   BASOSABS 0.0 08/19/2021 1225      Latest Ref Rng & Units 08/19/2021   12:25 PM 07/17/2021    8:38 AM 06/26/2021   10:27 AM  CMP  Glucose 70 - 99 mg/dL 102   159   119    BUN 8 - 23 mg/dL '15   12   16    '$ Creatinine 0.61 - 1.24 mg/dL 1.49   1.44   1.48    Sodium 135 - 145 mmol/L 137   139   139    Potassium 3.5 - 5.1 mmol/L 3.8   3.7   3.8    Chloride 98 - 111 mmol/L 107   104   106    CO2 22 - 32 mmol/L '24   27   25    '$ Calcium 8.9 - 10.3 mg/dL 8.9   9.1   8.8    Total Protein 6.5 - 8.1 g/dL 7.5   7.7     Total Bilirubin 0.3 - 1.2 mg/dL 1.8   2.1     Alkaline Phos 38 - 126 U/L 58   58     AST 15 - 41 U/L 31   28     ALT 0 - 44 U/L 45   40       DIAGNOSTIC IMAGING:  I have independently reviewed the relevant imaging and discussed with the patient.  ASSESSMENT & PLAN: 1.  Recurrent pulmonary embolism - Initial pulmonary embolism on 03/30/2010 after CABG surgery -Per cardiology notes, patient has history of left ventricular mural thrombus, was on chronic anticoagulation with Eliquis - Patient was on Eliquis at the time of recurrent PE - presented to ED on 10/18/2020 with chest tightness and SOB, CTA chest showed age-indeterminate pulmonary artery emboli involving segmental branches of right middle and lower lobe region - Lower extremity Dopplers negative for DVT - Due to failure of Eliquis treatment, patient was transitioned to Coumadin therapy.  Follows at Coumadin clinic for INR checks and dosing adjustments.   - Hypercoagulation studies would not change management at this time, as patient should continue indefinite anticoagulation.  Recommend holding hypercoagulable studies at this time. - Most recent D-dimer (08/19/2021) undetectable at <0.27.  INR (08/20/2021) 3.3.  CBC unremarkable with Hgb 15.9.  CMP at baseline. - He denies any major bleeding events.  Single episode of reddish urine 2 months ago, no recurrent hematuria. - No symptoms of recurrent DVT or  PE.   - PLAN: Continue indefinite anticoagulation with Coumadin.  Continue follow-up at Coumadin clinic for INR and dosing adjustments. - RTC in 1 year  for ongoing risk/benefit assessment of anticoagulation.    All questions were answered. The patient knows to call the clinic with any problems, questions or concerns.  Medical decision making: Low  Time spent on visit: I spent 15 minutes counseling the patient face to face. The total time spent in the appointment was 25 minutes and more than 50% was on counseling.   Brian Rush, PA-C  08/26/2021 11:28 AM

## 2021-08-26 ENCOUNTER — Inpatient Hospital Stay (HOSPITAL_BASED_OUTPATIENT_CLINIC_OR_DEPARTMENT_OTHER): Payer: 59 | Admitting: Physician Assistant

## 2021-08-26 VITALS — BP 142/66 | HR 80 | Temp 97.8°F | Resp 18 | Ht 66.0 in | Wt 223.5 lb

## 2021-08-26 DIAGNOSIS — Z86711 Personal history of pulmonary embolism: Secondary | ICD-10-CM

## 2021-08-26 DIAGNOSIS — I2694 Multiple subsegmental pulmonary emboli without acute cor pulmonale: Secondary | ICD-10-CM | POA: Diagnosis not present

## 2021-08-26 NOTE — Patient Instructions (Signed)
[place] at [facility] Discharge Instructions  You were seen today by Tarri Abernethy PA-C for your history of pulmonary embolism (blood clot in your lungs).  It is important that you continue to take warfarin (Coumadin), which is a blood thinner that will help to prevent future blood clots.  Please let us know if you experience any major bleeding such as blood in your urine, bloody bowel movements, dark black and tarry bowel movements, nosebleeds, or other sources of blood loss.  If you have any signs of bleeding, please seek immediate medical attention.  Please seek immediate medical attention if you experience any signs of another blood clot such as chest pain and difficulty breathing, coughing up blood, or any swelling in one leg but with other leg being normal.  LABS: Return in 1 year for repeat labs  MEDICATIONS: Continue warfarin/Coumadin as directed by Coumadin clinic.  FOLLOW-UP APPOINTMENT: Office visit in 1 year, after labs   Thank you for choosing [place] at [facility] to provide your oncology and hematology care.  To afford each patient quality time with our provider, please arrive at least 15 minutes before your scheduled appointment time.   If you have a lab appointment with the [facility] please come in thru the Main Entrance and check in at the main information desk.  You need to re-schedule your appointment should you arrive 10 or more minutes late.  We strive to give you quality time with our providers, and arriving late affects you and other patients whose appointments are after yours.  Also, if you no show three or more times for appointments you may be dismissed from the clinic at the providers discretion.     Again, thank you for choosing [facility].  Our hope is that these requests will decrease the amount of time that you wait before being seen by our physicians.        _____________________________________________________________  Should you have questions after your visit to [facility], please contact our office at [phone] and follow the prompts.  Our office hours are 8:00 a.m. and 4:30 p.m. Monday - Friday.  Please note that voicemails left after 4:00 p.m. may not be returned until the following business day.  We are closed weekends and major holidays.  You do have access to a nurse 24-7, just call the main number to the clinic [phone] and do not press any options, hold on the line and a nurse will answer the phone.    For prescription refill requests, have your pharmacy contact our office and allow 72 hours.    Due to Covid, you will need to wear a mask upon entering the hospital. If you do not have a mask, a mask will be given to you at the Main Entrance upon arrival. For doctor visits, patients may have 1 support person age 44 or older with them. For treatment visits, patients can not have anyone with them due to social distancing guidelines and our immunocompromised population.

## 2021-08-28 ENCOUNTER — Ambulatory Visit (HOSPITAL_COMMUNITY)
Admission: RE | Admit: 2021-08-28 | Discharge: 2021-08-28 | Disposition: A | Discharge status: Home / Self Care | Payer: 59 | Source: Ambulatory Visit | Attending: Student | Admitting: Student

## 2021-08-28 DIAGNOSIS — I5022 Chronic systolic (congestive) heart failure: Secondary | ICD-10-CM | POA: Insufficient documentation

## 2021-08-28 LAB — ECHOCARDIOGRAM LIMITED: S' Lateral: 4.2 cm

## 2021-08-28 MED ORDER — PERFLUTREN LIPID MICROSPHERE
1.0000 mL | INTRAVENOUS | Status: AC | PRN
  Administered 2021-08-28: 4 mL via INTRAVENOUS

## 2021-08-28 NOTE — Progress Notes (Signed)
*  PRELIMINARY RESULTS* Echocardiogram Limited 2-D Echocardiogram  has been performed with [facility].  [name] 08/28/2021, 11:12 AM

## 2021-09-17 ENCOUNTER — Ambulatory Visit (INDEPENDENT_AMBULATORY_CARE_PROVIDER_SITE_OTHER): Payer: 59 | Admitting: *Deleted

## 2021-09-17 DIAGNOSIS — Z5181 Encounter for therapeutic drug level monitoring: Secondary | ICD-10-CM

## 2021-09-17 DIAGNOSIS — I513 Intracardiac thrombosis, not elsewhere classified: Secondary | ICD-10-CM | POA: Diagnosis not present

## 2021-09-17 DIAGNOSIS — Z8673 Personal history of transient ischemic attack (TIA), and cerebral infarction without residual deficits: Secondary | ICD-10-CM | POA: Diagnosis not present

## 2021-09-17 DIAGNOSIS — Z86711 Personal history of pulmonary embolism: Secondary | ICD-10-CM | POA: Diagnosis not present

## 2021-09-17 LAB — POCT INR: INR: 3.3 — AB (ref 2.0–3.0)

## 2021-09-17 NOTE — Patient Instructions (Signed)
Hold warfarin tonight then decrease dose to 1/2 tablet daily except 1 tablet on Sundays  Be consistent with Vit K foods Recheck INR in 4 wks

## 2021-10-03 ENCOUNTER — Emergency Department (HOSPITAL_COMMUNITY): Payer: 59

## 2021-10-03 ENCOUNTER — Other Ambulatory Visit: Payer: Self-pay

## 2021-10-03 ENCOUNTER — Encounter (HOSPITAL_COMMUNITY): Payer: Self-pay | Admitting: *Deleted

## 2021-10-03 ENCOUNTER — Emergency Department (HOSPITAL_COMMUNITY)
Admission: EM | Admit: 2021-10-03 | Discharge: 2021-10-04 | Disposition: A | Discharge status: Home / Self Care | Payer: 59 | Attending: Emergency Medicine | Admitting: Emergency Medicine

## 2021-10-03 DIAGNOSIS — I10 Essential (primary) hypertension: Secondary | ICD-10-CM | POA: Insufficient documentation

## 2021-10-03 DIAGNOSIS — Z7901 Long term (current) use of anticoagulants: Secondary | ICD-10-CM | POA: Insufficient documentation

## 2021-10-03 DIAGNOSIS — R0789 Other chest pain: Secondary | ICD-10-CM | POA: Insufficient documentation

## 2021-10-03 DIAGNOSIS — R079 Chest pain, unspecified: Secondary | ICD-10-CM

## 2021-10-03 DIAGNOSIS — I2510 Atherosclerotic heart disease of native coronary artery without angina pectoris: Secondary | ICD-10-CM | POA: Diagnosis not present

## 2021-10-03 DIAGNOSIS — Z951 Presence of aortocoronary bypass graft: Secondary | ICD-10-CM | POA: Diagnosis not present

## 2021-10-03 LAB — CBC
HCT: 48.4 % (ref 39.0–52.0)
Hemoglobin: 15.4 g/dL (ref 13.0–17.0)
MCH: 29.2 pg (ref 26.0–34.0)
MCHC: 31.8 g/dL (ref 30.0–36.0)
MCV: 91.8 fL (ref 80.0–100.0)
Platelets: 152 10*3/uL (ref 150–400)
RBC: 5.27 MIL/uL (ref 4.22–5.81)
RDW: 13.9 % (ref 11.5–15.5)
WBC: 6.2 10*3/uL (ref 4.0–10.5)
nRBC: 0 % (ref 0.0–0.2)

## 2021-10-03 LAB — BASIC METABOLIC PANEL
Anion gap: 7 (ref 5–15)
BUN: 14 mg/dL (ref 8–23)
CO2: 25 mmol/L (ref 22–32)
Calcium: 8.6 mg/dL — ABNORMAL LOW (ref 8.9–10.3)
Chloride: 106 mmol/L (ref 98–111)
Creatinine, Ser: 1.52 mg/dL — ABNORMAL HIGH (ref 0.61–1.24)
GFR, Estimated: 47 mL/min — ABNORMAL LOW (ref >60)
Glucose, Bld: 170 mg/dL — ABNORMAL HIGH (ref 70–99)
Potassium: 3.7 mmol/L (ref 3.5–5.1)
Sodium: 138 mmol/L (ref 135–145)

## 2021-10-03 LAB — TROPONIN I (HIGH SENSITIVITY): Troponin I (High Sensitivity): 8 ng/L (ref <18)

## 2021-10-03 NOTE — ED Triage Notes (Signed)
Pt with right sided chest pain while in the shower. And off and on-sharp in nature x 4 today.  Denies pain currently.

## 2021-10-03 NOTE — ED Provider Notes (Signed)
[facility] EMERGENCY DEPARTMENT Provider Note   CSN: [id number] Arrival date & time: 10/03/21  2107     History  Chief Complaint  Patient presents with   Chest Pain    [name] is a 76 y.o. male.  Patient is a 76 year old male with past medical history of coronary artery disease status post CABG and stenting.  He also has history of chronic renal insufficiency, hypertension, hyperlipidemia, pulmonary embolism.  Patient presenting today with complaints of chest discomfort.  This began this evening at rest.  He describes a pressure to the center of his chest that came and went for approximately 2 hours.  He denies any nausea, diaphoresis, or radiation to the arm or jaw.  He tells me he took Rolaids and the pain has since subsided.  He denies any recent exertional symptoms.  Last heart cath was approximately 3 years ago and medical therapy was recommended.  The history is provided by the patient.       Home Medications Prior to Admission medications   Medication Sig Start Date End Date Taking? Authorizing Provider  atorvastatin (LIPITOR) 40 MG tablet Take 1 tablet (40 mg total) by mouth daily at 6 PM. 03/09/17   Debbe Odea, MD  clopidogrel (PLAVIX) 75 MG tablet Take 75 mg by mouth daily.    [provider]  clotrimazole-betamethasone (LOTRISONE) cream APPLY TO AFFECTED AREA TWICE A DAY 01/30/21   [provider]  cycloSPORINE (RESTASIS) 0.05 % ophthalmic emulsion Place 1 drop into both eyes daily as needed (dry eye).     [provider]  empagliflozin (JARDIANCE) 10 MG TABS tablet Take 1 tablet (10 mg total) by mouth daily before breakfast. 05/31/21   Fay Records, MD  fluticasone [facility]) 50 MCG/ACT nasal spray See admin instructions. 10/09/20   [provider]  furosemide (LASIX) 20 MG tablet TAKE 3 TABLETS BY MOUTH EVERY DAY 10/02/20   Fay Records, MD  hydrALAZINE (APRESOLINE) 25 MG tablet Take 1 tablet (25 mg total) by mouth 3 (three)  times daily. 09/12/20 06/29/22  Fay Records, MD  HYDROcodone-acetaminophen (NORCO) 5-325 MG tablet Take 1 tablet by mouth every 6 (six) hours as needed for severe pain. 11/06/19   Sherwood Gambler, MD  isosorbide mononitrate (IMDUR) 60 MG 24 hr tablet Take 1 tablet (60 mg total) by mouth daily. 09/12/20 06/29/22  Fay Records, MD  metoprolol succinate (TOPROL-XL) 50 MG 24 hr tablet Take 50 mg by mouth daily. 04/27/17   [provider]  nitroGLYCERIN (NITROSTAT) 0.4 MG SL tablet Place 1 tablet (0.4 mg total) under the tongue every 5 (five) minutes as needed for chest pain. up to 3 doses. 07/05/13   Lendon Colonel, NP  spironolactone (ALDACTONE) 25 MG tablet Take 1 tablet (25 mg total) by mouth daily. 06/28/21 06/23/22  Strader, Fransisco Hertz, PA-C  traMADol (ULTRAM) 50 MG tablet Take 50 mg by mouth 3 (three) times daily. 11/29/19   [provider]  warfarin (COUMADIN) 5 MG tablet Take 1/2 tablet to 1 tablet daily or as directed 04/30/21   Fay Records, MD      Allergies    Lisinopril    Review of Systems   Review of Systems  All other systems reviewed and are negative.   Physical Exam Updated Vital Signs BP 137/73   Pulse (!) 54   Temp 97.7 F (36.5 C) (Oral)   Resp 10   Ht '5\' 6"'$  (1.676 m)   Wt 99.8  kg   SpO2 94%   BMI 35.51 kg/m  Physical Exam Vitals and nursing note reviewed.  Constitutional:      General: He is not in acute distress.    Appearance: He is well-developed. He is not diaphoretic.  HENT:     Head: Normocephalic and atraumatic.  Cardiovascular:     Rate and Rhythm: Normal rate and regular rhythm.     Heart sounds: No murmur heard.    No friction rub.  Pulmonary:     Effort: Pulmonary effort is normal. No respiratory distress.     Breath sounds: Normal breath sounds. No wheezing or rales.  Abdominal:     General: Bowel sounds are normal. There is no distension.     Palpations: Abdomen is soft.     Tenderness: There is no abdominal tenderness.   Musculoskeletal:        General: Normal range of motion.     Cervical back: Normal range of motion and neck supple.     Right lower leg: No tenderness. No edema.     Left lower leg: No tenderness. No edema.  Skin:    General: Skin is warm and dry.  Neurological:     Mental Status: He is alert and oriented to person, place, and time.     Coordination: Coordination normal.     ED Results / Procedures / Treatments   Labs (all labs ordered are listed, but only abnormal results are displayed) Labs Reviewed  BASIC METABOLIC PANEL - Abnormal; Notable for the following components:      Result Value   Glucose, Bld 170 (*)    Creatinine, Ser 1.52 (*)    Calcium 8.6 (*)    GFR, Estimated 47 (*)    All other components within normal limits  CBC  TROPONIN I (HIGH SENSITIVITY)  TROPONIN I (HIGH SENSITIVITY)    EKG EKG Interpretation  Date/Time:  Thursday October 03 2021 21:15:13 EDT Ventricular Rate:  74 PR Interval:  126 QRS Duration: 92 QT Interval:  402 QTC Calculation: 446 R Axis:   57 Text Interpretation: Sinus rhythm with marked sinus arrhythmia with occasional Premature ventricular complexes Inferior infarct , age undetermined Possible Anterolateral infarct , age undetermined Abnormal ECG When compared with ECG of 18-Oct-2020 02:05, No significant changes noted Confirmed by Veryl Speak [phone]) on 10/03/2021 11:26:31 PM  Radiology DG Chest 2 View  Result Date: 10/03/2021 CLINICAL DATA:  Acute onset of right-sided chest pain and shortness of breath. Coronary artery disease. EXAM: CHEST - 2 VIEW COMPARISON:  10/18/2020 FINDINGS: Stable mild cardiomegaly. Prior CABG again noted. Pulmonary hyperinflation remains stable. No evidence of pulmonary infiltrate or edema. No evidence of pleural effusion. IMPRESSION: Stable mild cardiomegaly and probable COPD.  No active lung disease. Electronically Signed   By: Marlaine Hind M.D.   On: 10/03/2021 21:42    Procedures Procedures     Medications Ordered in ED Medications - No data to display  ED Course/ Medical Decision Making/ A&P  This patient presents to the ED for concern of chest discomfort, this involves an extensive number of treatment options, and is a complaint that carries with it a high risk of complications and morbidity.  The differential diagnosis includes acute coronary syndrome, GERD, pulmonary embolism, aortic dissection, musculoskeletal etiology   Co morbidities that complicate the patient evaluation  Prior CABG   Additional history obtained:  No additional history or external records needed  Lab Tests:  I Ordered, and personally interpreted labs.  The  pertinent results include: Unremarkable CBC, basic metabolic panel, and troponin x2   Imaging Studies ordered:  I ordered imaging studies including chest x-ray I independently visualized and interpreted imaging which showed no acute process I agree with the radiologist interpretation   Cardiac Monitoring: / EKG:  The patient was maintained on a cardiac monitor.  I personally viewed and interpreted the cardiac monitored which showed an underlying rhythm of: Sinus rhythm with no acute changes   Consultations Obtained:  No consultations indicated   Problem List / ED Course / Critical interventions / Medication management  Patient presenting with complaints of chest discomfort that occurred this evening at home.  Symptoms relieved with Rolaids and work-up unremarkable.  Patient does have significant cardiac history, but cardiac work-up thus far is unremarkable.  Admission discussed with patient, but he prefers to go home.  He has an appointment upcoming with his cardiologist in the next week and will keep this appointment.  He is to return in the meantime if his symptoms worsen or change. No medications given. I have reviewed the patients home medicines and have made adjustments as needed   Social Determinants of  Health:  None   Test / Admission - Considered:  Patient's troponins both negative and EKG is unchanged.  He is now pain-free and has been so for several hours.  He will follow-up with his cardiologist as an outpatient.   Final Clinical Impression(s) / ED Diagnoses Final diagnoses:  None    Rx / DC Orders ED Discharge Orders     None         Veryl Speak, MD 10/04/21 [phone]

## 2021-10-04 LAB — TROPONIN I (HIGH SENSITIVITY): Troponin I (High Sensitivity): 9 ng/L (ref <18)

## 2021-10-04 NOTE — Discharge Instructions (Signed)
Continue medications as previously prescribed.  Follow-up with your cardiologist in the next week as scheduled, and return to the ER if your symptoms significantly worsen or change.

## 2021-10-04 NOTE — ED Notes (Signed)
Pt ambulated to the bathroom unassisted.  

## 2021-10-15 ENCOUNTER — Ambulatory Visit (INDEPENDENT_AMBULATORY_CARE_PROVIDER_SITE_OTHER): Payer: 59 | Admitting: Internal Medicine

## 2021-10-15 ENCOUNTER — Encounter: Payer: Self-pay | Admitting: Internal Medicine

## 2021-10-15 VITALS — BP 138/78 | HR 72 | Ht 66.0 in | Wt 222.8 lb

## 2021-10-15 DIAGNOSIS — I2510 Atherosclerotic heart disease of native coronary artery without angina pectoris: Secondary | ICD-10-CM

## 2021-10-15 MED ORDER — HYDRALAZINE HCL 50 MG PO TABS: 50.0000 mg | ORAL_TABLET | Freq: Three times a day (TID) | ORAL | 3 refills | Status: DC

## 2021-10-15 NOTE — Patient Instructions (Signed)
Medication Instructions:  Your physician recommends that you continue on your current medications as directed. Please refer to the Current Medication list given to you today.  Increase Hydralazine to 50 mg Three Times Daily   *If you need a refill on your cardiac medications before your next appointment, please call your pharmacy*   Lab Work: NONE   If you have labs (blood work) drawn today and your tests are completely normal, you will receive your results only by: [place] (if you have MyChart) OR A paper copy in the mail If you have any lab test that is abnormal or we need to change your treatment, we will call you to review the results.   Testing/Procedures: NONE    Follow-Up: At [facility], you and your health needs are our priority.  As part of our continuing mission to provide you with exceptional heart care, we have created designated Provider Care Teams.  These Care Teams include your primary Cardiologist (physician) and Advanced Practice Providers (APPs -  Physician Assistants and Nurse Practitioners) who all work together to provide you with the care you need, when you need it.  We recommend signing up for the patient portal called "MyChart".  Sign up information is provided on this After Visit Summary.  MyChart is used to connect with patients for Virtual Visits (Telemedicine).  Patients are able to view lab/test results, encounter notes, upcoming appointments, etc.  Non-urgent messages can be sent to your provider as well.   To learn more about what you can do with MyChart, go to [email or web address].    Your next appointment:    October   The format for your next appointment:   In Person  Provider:   [name], MD    Other Instructions Thank you for choosing [facility]!    Important Information About Sugar

## 2021-10-15 NOTE — Progress Notes (Signed)
Cardiology Office Note   Date:  10/15/2021   ID:  Amaar, Oshita 06-14-1945, MRN [id number]  PCP:  [name], MD  Cardiologist:   [name], MD    Pt presents for f/u of CAD      History of Present Illness: [name] is a 76 y.o. male with a history of CAD   He is s/p CABG x 4 in 2011.  In May 2019 he had intervention to native RCA.   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    In 2020 he presented with NSTEMI   Had been off of anticoagulation.   Cath showed sever native CAD with occluded prox LAD and LCx   The RCA was patent, patient stents.   The LIMA to LAD; SVG to OM1, SVG to Diag were all patient.   Given presentation there was a question of possible coronary embolism     Treated with heparin    Echo at the time showed LVEF 35 to 40% with akinesis of inferior, septal, apical walls  In July 2022 the pt was admitted for PE  He is now on  coumadin   Echo in July showed LVEF 30 to 35% with akinesis of the apex, distal infeiror, anteroseptal walls    RV  I sw the pt earlier this year He has also been seen by B Strader, working ot optimize medical therapy    He had an echo in May 2023 (limited)   LVEF was 35 to 40%  RVEF was normal  The pt was seen in ER on 6/29   Experienced some CP   More R sided    Now on reflection thinks it was gas.  Relieved with Rolaids  He denies CP   Breathing is OK  he walks regularly   Denies dizziness Does not take BP at home Has appt for INR tomorrow     Current Meds  Medication Sig   atorvastatin (LIPITOR) 40 MG tablet Take 1 tablet (40 mg total) by mouth daily at 6 PM.   clopidogrel (PLAVIX) 75 MG tablet Take 75 mg by mouth daily.   clotrimazole-betamethasone (LOTRISONE) cream APPLY TO AFFECTED AREA TWICE A DAY   cycloSPORINE (RESTASIS) 0.05 % ophthalmic emulsion Place 1 drop into both eyes daily as needed (dry eye).    empagliflozin (JARDIANCE) 10 MG TABS tablet Take 1 tablet (10  mg total) by mouth daily before breakfast.   fluticasone (FLONASE) 50 MCG/ACT nasal spray See admin instructions.   furosemide (LASIX) 20 MG tablet TAKE 3 TABLETS BY MOUTH EVERY DAY   hydrALAZINE (APRESOLINE) 25 MG tablet Take 1 tablet (25 mg total) by mouth 3 (three) times daily.   HYDROcodone-acetaminophen (NORCO) 5-325 MG tablet Take 1 tablet by mouth every 6 (six) hours as needed for severe pain.   isosorbide mononitrate (IMDUR) 60 MG 24 hr tablet Take 1 tablet (60 mg total) by mouth daily.   metoprolol succinate (TOPROL-XL) 50 MG 24 hr tablet Take 50 mg by mouth daily.   nitroGLYCERIN (NITROSTAT) 0.4 MG SL tablet Place 1 tablet (0.4 mg total) under the tongue every 5 (five) minutes as needed for chest pain. up to 3 doses.   spironolactone (ALDACTONE) 25 MG tablet Take 1 tablet (25 mg total) by mouth daily.   traMADol (ULTRAM) 50 MG tablet Take 50 mg by mouth 3 (three) times daily.   warfarin (COUMADIN) 5 MG tablet  Take 1/2 tablet to 1 tablet daily or as directed     Allergies:   Lisinopril   Past Medical History:  Diagnosis Date   Apical mural thrombus    Arteriosclerotic cardiovascular disease (ASCVD)    a. s/p CABG in 2011 b. DES to RCA in 08/2017 c. cath in 06/2018 showing patent LIMA-LAD, SVG-OM1-OM2 and SVG-D1   Chronic anticoagulation    Chronic systolic CHF (congestive heart failure) ([place])    a. EF 45-50% in 06/2012 b. 35-40% in 08/2017 with similar results in 06/2018 c. EF at 30-35% in 10/2020   CVA (cerebral vascular accident) ([place])    HTN (hypertension)    Hyperlipidemia    Keloid    median sternotomy   Pulmonary embolism ([place]) 03/2010   Substance abuse ([place])    formerly cocaine    Past Surgical History:  Procedure Laterality Date   COLONOSCOPY N/A 01/22/2017   Procedure: COLONOSCOPY;  Surgeon: Danie Binder, MD;  Location: [facility] ENDO SUITE;  Service: Endoscopy;  Laterality: N/A;  200   CORONARY ARTERY BYPASS GRAFT  03/18/2010   LIMA-LAD, SVG to diagonal, OM1 &  OM2   CORONARY STENT INTERVENTION N/A 08/21/2017   Procedure: CORONARY STENT INTERVENTION;  Surgeon: [name], MD;  Location: [place] CV LAB;  Service: Cardiovascular;  Laterality: N/A;   LEFT HEART CATH AND CORS/GRAFTS ANGIOGRAPHY N/A 08/21/2017   Procedure: LEFT HEART CATH AND CORS/GRAFTS ANGIOGRAPHY;  Surgeon: [name], MD;  Location: [place] CV LAB;  Service: Cardiovascular;  Laterality: N/A;   LEFT HEART CATH AND CORS/GRAFTS ANGIOGRAPHY N/A 06/21/2018   Procedure: LEFT HEART CATH AND CORS/GRAFTS ANGIOGRAPHY;  Surgeon: Nelva Bush, MD;  Location: [place] CV LAB;  Service: Cardiovascular;  Laterality: N/A;   POLYPECTOMY  01/22/2017   Procedure: POLYPECTOMY;  Surgeon: Danie Binder, MD;  Location: [facility];  Service: Endoscopy;;  Transverse(CS) and sigmoid colon(HS)   PTCA  06/1996   LAD & RCA   TEE WITHOUT CARDIOVERSION N/A 06/10/2012   Procedure: TRANSESOPHAGEAL ECHOCARDIOGRAM (TEE);  Surgeon: Josue Hector, MD;  Location: [facility];  Service: Cardiovascular;  Laterality: N/A;     Social History:  The patient  reports that he quit smoking about 19 years ago. His smoking use included cigarettes. He has never used smokeless tobacco. He reports that he does not currently use alcohol. He reports that he does not use drugs.   Family History:  The patient's family history includes Cancer (age of onset: 5) in his mother.    ROS:  Please see the history of present illness. All other systems are reviewed and  Negative to the above problem except as noted.    PHYSICAL EXAM: VS:  BP 138/78   Pulse 72   Ht '5\' 6"'$  (1.676 m)   Wt 222 lb 12.8 oz (101.1 kg)   SpO2 94%   BMI 35.96 kg/m   GEN: Obese 76 yo in no acute distress  HEENT: normal  Neck: no JVD,  Cardiac: RRR; no murmur.   No LE edema  Respiratory:  clear to auscultation bilaterally GI: soft, nontender, nondistended, + BS  No hepatomegaly  MS: no deformity Moving all extremities   Skin: warm and  dry, no rash Neuro:  Strength and sensation are intact Psych: euthymic mood, full affect   EKG:  EKG is not ordered today.  Echo   May 2023    1. Limited study.   2. Left ventricular ejection fraction, by estimation, is 35 to  40%. The  left ventricle has moderately decreased function. The left ventricle  demonstrates regional wall motion abnormalities (see scoring  diagram/findings for description).   3. Definity contrast shows slow flow at LV apex but no formed thrombus.   4. Right ventricular systolic function is normal. The right ventricular  size is normal.   5. The inferior vena cava is normal in size with greater than 50%  respiratory variability, suggesting right atrial pressure of 3 mmHg.   Echo  July 2022 1. Left ventricular ejection fraction, by estimation, is 30 to 35%. The left ventricle has normal function. Left ventricular endocardial border not optimally defined to evaluate regional wall motion. Left ventricular diastolic parameters were normal. The entire apex is akinetic, there is some stasis of blood flow in the apex without clear thrombus. The distal inferior, anteroseptal walls are akinetic. The inferior wall is hypokinetic. 2. Right ventricular systolic function was not well visualized. The right ventricular size is not well visualized. There is moderately elevated pulmonary artery systolic pressure. 3. The mitral valve is normal in structure. No evidence of mitral valve regurgitation. No evidence of mitral stenosis. 4. The aortic valve is tricuspid. Aortic valve regurgitation is not visualized. No aortic stenosis is present. 5. The inferior vena cava is normal in size with greater than 50% respiratory variabil  Cath; The following studies were reviewed today: Cath March 2020- Conclusions: Severe native coronary artery disease, including occluded proximal LAD and LCx.  RCA with patent mid/distal stents and mild in-stent restenosis. Widely patent LIMA-LAD,  SVG-OM1-OM, and SVG-D. Mildly elevated left ventricular filling pressure.   Recommendations: Medical therapy; question coronary embolism with spontaneous lysis.  Plan to restart heparin infusion 2 hours after TR band removal and resumption of apixaban tomorrow AM if no evidence of bleeding. Continue clopidogrel 75 mg daily.  Defer continuing aspirin in the setting of long-term anticoagulation and clopidogrel use. Aggressive secondary prevention.    Lipid Panel    Component Value Date/Time   CHOL 82 07/17/2021 0838   TRIG 93 07/17/2021 0838   HDL 37 (L) 07/17/2021 0838   CHOLHDL 2.2 07/17/2021 0838   VLDL 19 07/17/2021 0838   LDLCALC 26 07/17/2021 0838      Wt Readings from Last 3 Encounters:  10/15/21 222 lb 12.8 oz (101.1 kg)  10/03/21 220 lb (99.8 kg)  08/26/21 223 lb 8.7 oz (101.4 kg)      ASSESSMENT AND PLAN:  1  CAD  Pt with severe native dz   Patent grafts at last cath  No CP   Follow     2  Hx systolic CHF   Lasat echo in May   LVEF 35 to 40%   Today volume status is good    His BP is a little high  140s/   When he gets pills filled again will increase hydralazine to 50 mg tid Keep on Hydralazine, Imdur, Toprol XL, Aldactone, Jardiance.     Had angioedema with ACE I / ARB    3  Hx PE  Keep on coumadin   INR tomorrow    4  Hx o mural LV thrombus  Akinetic region at apex  Continue anticoagulatoin   5  Hx R brain CVA  2014  Continue anticoagulation     6  HTN    BP is not optimal   Will increase hydralazine to 50 tid    Follow up in October   7  HL  On lipitor   LDL 26,  HDL 37   Trig 93     8  CKD   Cr 1.52 in June   has been 1.28 to 1.54 for the past few years   Follow     Follow up in October    Current medicines are reviewed at length with the patient today.  The patient does not have concerns regarding medicines.  Signed, [name], MD  10/15/2021 9:45 AM    [address], [place], [place]  [address] Phone: [phone]; Fax: [phone]

## 2021-10-16 ENCOUNTER — Ambulatory Visit (INDEPENDENT_AMBULATORY_CARE_PROVIDER_SITE_OTHER): Payer: 59 | Admitting: *Deleted

## 2021-10-16 DIAGNOSIS — Z5181 Encounter for therapeutic drug level monitoring: Secondary | ICD-10-CM

## 2021-10-16 DIAGNOSIS — I513 Intracardiac thrombosis, not elsewhere classified: Secondary | ICD-10-CM

## 2021-10-16 DIAGNOSIS — Z8673 Personal history of transient ischemic attack (TIA), and cerebral infarction without residual deficits: Secondary | ICD-10-CM | POA: Diagnosis not present

## 2021-10-16 DIAGNOSIS — I6330 Cerebral infarction due to thrombosis of unspecified cerebral artery: Secondary | ICD-10-CM

## 2021-10-16 DIAGNOSIS — Z86711 Personal history of pulmonary embolism: Secondary | ICD-10-CM | POA: Diagnosis not present

## 2021-10-16 LAB — POCT INR: INR: 1.1 — AB (ref 2.0–3.0)

## 2021-10-16 MED ORDER — WARFARIN SODIUM 5 MG PO TABS: ORAL_TABLET | ORAL | 5 refills | Status: DC

## 2021-10-16 NOTE — Patient Instructions (Signed)
Take warfarin 1 tablet tonight and tomorrow night then continue 1/2 tablet daily except 1 tablet on Sundays  Be consistent with [employer] Recheck INR in 2 wks

## 2021-10-31 ENCOUNTER — Ambulatory Visit (INDEPENDENT_AMBULATORY_CARE_PROVIDER_SITE_OTHER): Payer: 59 | Admitting: *Deleted

## 2021-10-31 DIAGNOSIS — Z5181 Encounter for therapeutic drug level monitoring: Secondary | ICD-10-CM

## 2021-10-31 DIAGNOSIS — I513 Intracardiac thrombosis, not elsewhere classified: Secondary | ICD-10-CM

## 2021-10-31 DIAGNOSIS — Z86711 Personal history of pulmonary embolism: Secondary | ICD-10-CM

## 2021-10-31 DIAGNOSIS — Z8673 Personal history of transient ischemic attack (TIA), and cerebral infarction without residual deficits: Secondary | ICD-10-CM | POA: Diagnosis not present

## 2021-10-31 LAB — POCT INR: INR: 1.9 — AB (ref 2.0–3.0)

## 2021-10-31 NOTE — Patient Instructions (Signed)
Description   Start taking warfarin 1/2 a tablet daily except for 1 tablet on Sunday and Thursdays. Recheck INR in 2 weeks.

## 2021-11-11 ENCOUNTER — Ambulatory Visit (INDEPENDENT_AMBULATORY_CARE_PROVIDER_SITE_OTHER): Payer: 59 | Admitting: *Deleted

## 2021-11-11 DIAGNOSIS — Z86711 Personal history of pulmonary embolism: Secondary | ICD-10-CM

## 2021-11-11 DIAGNOSIS — I6330 Cerebral infarction due to thrombosis of unspecified cerebral artery: Secondary | ICD-10-CM | POA: Diagnosis not present

## 2021-11-11 DIAGNOSIS — Z5181 Encounter for therapeutic drug level monitoring: Secondary | ICD-10-CM

## 2021-11-11 DIAGNOSIS — I513 Intracardiac thrombosis, not elsewhere classified: Secondary | ICD-10-CM

## 2021-11-11 LAB — POCT INR: INR: 2.5 (ref 2.0–3.0)

## 2021-11-11 NOTE — Patient Instructions (Signed)
Continue warfarin 1/2 a tablet daily except for 1 tablet on Sunday and Thursdays. Recheck INR in 4 weeks.

## 2021-12-10 ENCOUNTER — Ambulatory Visit: Payer: 59 | Attending: Internal Medicine | Admitting: *Deleted

## 2021-12-10 DIAGNOSIS — Z86711 Personal history of pulmonary embolism: Secondary | ICD-10-CM | POA: Diagnosis not present

## 2021-12-10 DIAGNOSIS — I6330 Cerebral infarction due to thrombosis of unspecified cerebral artery: Secondary | ICD-10-CM

## 2021-12-10 DIAGNOSIS — I513 Intracardiac thrombosis, not elsewhere classified: Secondary | ICD-10-CM | POA: Diagnosis not present

## 2021-12-10 DIAGNOSIS — Z5181 Encounter for therapeutic drug level monitoring: Secondary | ICD-10-CM | POA: Diagnosis not present

## 2021-12-10 LAB — POCT INR: INR: 2.9 (ref 2.0–3.0)

## 2021-12-10 NOTE — Patient Instructions (Signed)
Continue warfarin 1/2 a tablet daily except for 1 tablet on Sunday and Thursdays. Recheck INR in 5 weeks.

## 2022-01-10 ENCOUNTER — Emergency Department (HOSPITAL_COMMUNITY): Payer: 59

## 2022-01-10 ENCOUNTER — Other Ambulatory Visit: Payer: Self-pay

## 2022-01-10 ENCOUNTER — Encounter (HOSPITAL_COMMUNITY): Payer: Self-pay | Admitting: *Deleted

## 2022-01-10 ENCOUNTER — Emergency Department (HOSPITAL_COMMUNITY)
Admission: EM | Admit: 2022-01-10 | Discharge: 2022-01-10 | Disposition: A | Discharge status: Home / Self Care | Payer: 59 | Attending: Emergency Medicine | Admitting: Emergency Medicine

## 2022-01-10 DIAGNOSIS — R079 Chest pain, unspecified: Secondary | ICD-10-CM

## 2022-01-10 DIAGNOSIS — Z7902 Long term (current) use of antithrombotics/antiplatelets: Secondary | ICD-10-CM | POA: Insufficient documentation

## 2022-01-10 DIAGNOSIS — I2510 Atherosclerotic heart disease of native coronary artery without angina pectoris: Secondary | ICD-10-CM | POA: Insufficient documentation

## 2022-01-10 DIAGNOSIS — Z7984 Long term (current) use of oral hypoglycemic drugs: Secondary | ICD-10-CM | POA: Diagnosis not present

## 2022-01-10 DIAGNOSIS — R0789 Other chest pain: Secondary | ICD-10-CM | POA: Insufficient documentation

## 2022-01-10 DIAGNOSIS — Z7901 Long term (current) use of anticoagulants: Secondary | ICD-10-CM | POA: Diagnosis not present

## 2022-01-10 DIAGNOSIS — R0602 Shortness of breath: Secondary | ICD-10-CM | POA: Diagnosis not present

## 2022-01-10 DIAGNOSIS — Z79899 Other long term (current) drug therapy: Secondary | ICD-10-CM | POA: Insufficient documentation

## 2022-01-10 DIAGNOSIS — N189 Chronic kidney disease, unspecified: Secondary | ICD-10-CM | POA: Insufficient documentation

## 2022-01-10 LAB — BASIC METABOLIC PANEL
Anion gap: 7 (ref 5–15)
BUN: 15 mg/dL (ref 8–23)
CO2: 24 mmol/L (ref 22–32)
Calcium: 8.7 mg/dL — ABNORMAL LOW (ref 8.9–10.3)
Chloride: 107 mmol/L (ref 98–111)
Creatinine, Ser: 1.37 mg/dL — ABNORMAL HIGH (ref 0.61–1.24)
GFR, Estimated: 54 mL/min — ABNORMAL LOW (ref >60)
Glucose, Bld: 90 mg/dL (ref 70–99)
Potassium: 3.9 mmol/L (ref 3.5–5.1)
Sodium: 138 mmol/L (ref 135–145)

## 2022-01-10 LAB — CBC
HCT: 50.1 % (ref 39.0–52.0)
Hemoglobin: 16.3 g/dL (ref 13.0–17.0)
MCH: 29.6 pg (ref 26.0–34.0)
MCHC: 32.5 g/dL (ref 30.0–36.0)
MCV: 91.1 fL (ref 80.0–100.0)
Platelets: 148 10*3/uL — ABNORMAL LOW (ref 150–400)
RBC: 5.5 MIL/uL (ref 4.22–5.81)
RDW: 13.5 % (ref 11.5–15.5)
WBC: 6 10*3/uL (ref 4.0–10.5)
nRBC: 0 % (ref 0.0–0.2)

## 2022-01-10 LAB — BRAIN NATRIURETIC PEPTIDE: B Natriuretic Peptide: 94 pg/mL (ref 0.0–100.0)

## 2022-01-10 LAB — PROTIME-INR
INR: 2.3 — ABNORMAL HIGH (ref 0.8–1.2)
Prothrombin Time: 25.4 seconds — ABNORMAL HIGH (ref 11.4–15.2)

## 2022-01-10 LAB — TROPONIN I (HIGH SENSITIVITY)
Troponin I (High Sensitivity): 9 ng/L (ref <18)
Troponin I (High Sensitivity): 8 ng/L (ref <18)

## 2022-01-10 MED ORDER — FAMOTIDINE IN NACL 20-0.9 MG/50ML-% IV SOLN
20.0000 mg | Freq: Once | INTRAVENOUS | Status: AC
  Administered 2022-01-10: 20 mg via INTRAVENOUS
  Filled 2022-01-10: qty 50

## 2022-01-10 NOTE — ED Provider Notes (Signed)
[facility] EMERGENCY DEPARTMENT Provider Note   CSN: [id number] Arrival date & time: 01/10/22  1022     History  Chief Complaint  Patient presents with   Chest Pain    [name] is a 76 y.o. male.  He has a history of significant heart disease and PE.  He is on Coumadin.  Cardiologist is Dr. Harrington Challenger.  Complaining of 1 or 2 hours of right-sided chest pain.  Happened at rest while he was watching TV.  He initially thought it was gas pains and tried Rolaids without improvement.  No change in his baseline shortness of breath no dizziness diaphoresis.  He said he just wanted to make sure it was not a heart attack.  The history is provided by the patient.  Chest Pain Pain location:  R chest Pain quality: aching   Pain radiates to:  Does not radiate Pain severity:  Moderate Onset quality:  Gradual Duration:  2 hours Timing:  Constant Progression:  Unchanged Chronicity:  Recurrent Context: at rest   Relieved by:  Nothing Worsened by:  Nothing Ineffective treatments:  Antacids Associated symptoms: shortness of breath   Associated symptoms: no abdominal pain, no cough, no diaphoresis, no fever, no nausea and no vomiting   Risk factors: coronary artery disease and prior DVT/PE   Risk factors: no smoking        Home Medications Prior to Admission medications   Medication Sig Start Date End Date Taking? Authorizing Provider  atorvastatin (LIPITOR) 40 MG tablet Take 1 tablet (40 mg total) by mouth daily at 6 PM. 03/09/17   Debbe Odea, MD  clopidogrel (PLAVIX) 75 MG tablet Take 75 mg by mouth daily.    [provider]  clotrimazole-betamethasone (LOTRISONE) cream APPLY TO AFFECTED AREA TWICE A DAY 01/30/21   [provider]  cycloSPORINE (RESTASIS) 0.05 % ophthalmic emulsion Place 1 drop into both eyes daily as needed (dry eye).     [provider]  empagliflozin (JARDIANCE) 10 MG TABS tablet Take 1 tablet (10 mg total) by mouth daily before breakfast.  05/31/21   Fay Records, MD  fluticasone [facility]) 50 MCG/ACT nasal spray See admin instructions. 10/09/20   [provider]  furosemide (LASIX) 20 MG tablet TAKE 3 TABLETS BY MOUTH EVERY DAY 10/02/20   Fay Records, MD  hydrALAZINE (APRESOLINE) 50 MG tablet Take 1 tablet (50 mg total) by mouth 3 (three) times daily. 10/15/21 10/10/22  Fay Records, MD  HYDROcodone-acetaminophen (NORCO) 5-325 MG tablet Take 1 tablet by mouth every 6 (six) hours as needed for severe pain. 11/06/19   Sherwood Gambler, MD  isosorbide mononitrate (IMDUR) 60 MG 24 hr tablet Take 1 tablet (60 mg total) by mouth daily. 09/12/20 06/29/22  Fay Records, MD  metoprolol succinate (TOPROL-XL) 50 MG 24 hr tablet Take 50 mg by mouth daily. 04/27/17   [provider]  nitroGLYCERIN (NITROSTAT) 0.4 MG SL tablet Place 1 tablet (0.4 mg total) under the tongue every 5 (five) minutes as needed for chest pain. up to 3 doses. 07/05/13   Lendon Colonel, NP  spironolactone (ALDACTONE) 25 MG tablet Take 1 tablet (25 mg total) by mouth daily. 06/28/21 06/23/22  Strader, Fransisco Hertz, PA-C  traMADol (ULTRAM) 50 MG tablet Take 50 mg by mouth 3 (three) times daily. 11/29/19   [provider]  warfarin (COUMADIN) 5 MG tablet Take 1/2 tablet to 1 tablet daily or as directed 10/16/21   Fay Records, MD  Allergies    Lisinopril    Review of Systems   Review of Systems  Constitutional:  Negative for diaphoresis and fever.  HENT:  Negative for sore throat.   Respiratory:  Positive for shortness of breath. Negative for cough.   Cardiovascular:  Positive for chest pain.  Gastrointestinal:  Negative for abdominal pain, nausea and vomiting.  Genitourinary:  Negative for dysuria.  Skin:  Negative for rash.    Physical Exam Updated Vital Signs BP 120/80   Pulse (!) 54   Temp 98.1 F (36.7 C) (Oral)   Resp (!) 21   Ht '5\' 6"'$  (1.676 m)   Wt 101.2 kg   SpO2 97%   BMI 35.99 kg/m  Physical Exam Vitals and nursing note  reviewed.  Constitutional:      General: He is not in acute distress.    Appearance: He is well-developed.  HENT:     Head: Normocephalic and atraumatic.  Eyes:     Conjunctiva/sclera: Conjunctivae normal.  Cardiovascular:     Rate and Rhythm: Normal rate and regular rhythm.     Heart sounds: Normal heart sounds. No murmur heard. Pulmonary:     Effort: Pulmonary effort is normal. No respiratory distress.     Breath sounds: Normal breath sounds.  Abdominal:     Palpations: Abdomen is soft.     Tenderness: There is no abdominal tenderness. There is no guarding or rebound.  Musculoskeletal:        General: No swelling. Normal range of motion.     Cervical back: Neck supple.     Right lower leg: No tenderness. No edema.     Left lower leg: No tenderness. No edema.  Skin:    General: Skin is warm and dry.     Capillary Refill: Capillary refill takes less than 2 seconds.  Neurological:     General: No focal deficit present.     Mental Status: He is alert.     ED Results / Procedures / Treatments   Labs (all labs ordered are listed, but only abnormal results are displayed) Labs Reviewed  BASIC METABOLIC PANEL - Abnormal; Notable for the following components:      Result Value   Creatinine, Ser 1.37 (*)    Calcium 8.7 (*)    GFR, Estimated 54 (*)    All other components within normal limits  CBC - Abnormal; Notable for the following components:   Platelets 148 (*)    All other components within normal limits  PROTIME-INR - Abnormal; Notable for the following components:   Prothrombin Time 25.4 (*)    INR 2.3 (*)    All other components within normal limits  BRAIN NATRIURETIC PEPTIDE  TROPONIN I (HIGH SENSITIVITY)  TROPONIN I (HIGH SENSITIVITY)    EKG EKG Interpretation  Date/Time:  Friday January 10 2022 10:41:23 EDT Ventricular Rate:  64 PR Interval:  130 QRS Duration: 90 QT Interval:  426 QTC Calculation: 439 R Axis:   28 Text Interpretation: Normal sinus  rhythm with sinus arrhythmia Inferior infarct (cited on or before 03-Oct-2021) Anterolateral infarct (cited on or before 03-Oct-2021) Abnormal ECG When compared with ECG of 03-Oct-2021 21:15, Premature ventricular complexes are no longer Present Confirmed by Aletta Edouard [phone]) on 01/10/2022 10:47:38 AM  Radiology DG Chest Port 1 View  Result Date: 01/10/2022 CLINICAL DATA:  Chest pain. EXAM: PORTABLE CHEST 1 VIEW COMPARISON:  October 03, 2021. FINDINGS: Sternotomy wires are noted. Stable cardiomediastinal silhouette. Probable mild central pulmonary vascular congestion  is noted. Minimal bibasilar subsegmental atelectasis or possibly edema is noted. Bony thorax is unremarkable. IMPRESSION: Probable mild central pulmonary vascular congestion. Minimal bibasilar subsegmental atelectasis or edema is noted. Electronically Signed   By: Marijo Conception M.D.   On: 01/10/2022 11:30    Procedures Procedures    Medications Ordered in ED Medications  famotidine (PEPCID) IVPB 20 mg premix (0 mg Intravenous Stopped 01/10/22 1321)    ED Course/ Medical Decision Making/ A&P Clinical Course as of 01/10/22 1854  Fri Jan 10, 2022  1352 Patient's troponins have been flat.  He said he is hungry and he is ready to be discharged.  Recommended close follow-up with his PCP. [MB]    Clinical Course User Index [MB] Hayden Rasmussen, MD                           Medical Decision Making Amount and/or Complexity of Data Reviewed Labs: ordered. Radiology: ordered.  Risk Prescription drug management.   This patient complains of right-sided chest pain; this involves an extensive number of treatment Options and is a complaint that carries with it a high risk of complications and morbidity. The differential includes ACS, pneumonia, PE, pneumothorax, musculoskeletal, reflux  I ordered, reviewed and interpreted labs, which included CBC with normal white count normal hemoglobin, chemistries with chronic CKD, INR  therapeutic, troponins flat, BNP normal I ordered medication IV Pepcid and reviewed PMP when indicated. I ordered imaging studies which included chest x-ray and I independently    visualized and interpreted imaging which showed central congestion no pneumonia Additional history obtained from patient's family member Previous records obtained and reviewed in epic including recent cardiology notes Cardiac monitoring reviewed, sinus bradycardia Social determinants considered, no significant barriers Critical Interventions: None  After the interventions stated above, I reevaluated the patient and found patient to be symptomatically improved Admission and further testing considered, no indications for admission or further work-up at this time.  Recommended close follow-up with PCP and cardiology.  Return instructions discussed         Final Clinical Impression(s) / ED Diagnoses Final diagnoses:  Nonspecific chest pain    Rx / DC Orders ED Discharge Orders     None         Hayden Rasmussen, MD 01/10/22 [phone]

## 2022-01-10 NOTE — ED Triage Notes (Signed)
Pt c/o chest pain that started about an hour pta while watching TV; pt state he took a rolaid thinking it was gas with no relief

## 2022-01-10 NOTE — Discharge Instructions (Signed)
You were seen in the emergency department for some right-sided chest pain.  You had blood work EKG and chest x-ray that did not show any evidence of heart attack.  Please continue your regular medications and follow-up with your primary care doctor and cardiologist.  Return to the emergency department if any worsening or concerning symptoms

## 2022-01-14 ENCOUNTER — Ambulatory Visit: Payer: 59 | Attending: Internal Medicine | Admitting: *Deleted

## 2022-01-14 DIAGNOSIS — Z86711 Personal history of pulmonary embolism: Secondary | ICD-10-CM

## 2022-01-14 DIAGNOSIS — Z5181 Encounter for therapeutic drug level monitoring: Secondary | ICD-10-CM | POA: Diagnosis not present

## 2022-01-14 DIAGNOSIS — I513 Intracardiac thrombosis, not elsewhere classified: Secondary | ICD-10-CM

## 2022-01-14 DIAGNOSIS — I6330 Cerebral infarction due to thrombosis of unspecified cerebral artery: Secondary | ICD-10-CM

## 2022-01-14 LAB — POCT INR: INR: 2.8 (ref 2.0–3.0)

## 2022-01-14 NOTE — Patient Instructions (Signed)
Continue warfarin 1/2 a tablet daily except for 1 tablet on Sunday and Thursdays. Recheck INR in 6 weeks.

## 2022-01-21 ENCOUNTER — Ambulatory Visit: Payer: 59 | Attending: Student | Admitting: Student

## 2022-01-21 ENCOUNTER — Encounter: Payer: Self-pay | Admitting: Student

## 2022-01-21 VITALS — BP 126/62 | HR 60 | Ht 66.0 in | Wt 220.0 lb

## 2022-01-21 DIAGNOSIS — I10 Essential (primary) hypertension: Secondary | ICD-10-CM

## 2022-01-21 DIAGNOSIS — I2510 Atherosclerotic heart disease of native coronary artery without angina pectoris: Secondary | ICD-10-CM

## 2022-01-21 DIAGNOSIS — I255 Ischemic cardiomyopathy: Secondary | ICD-10-CM

## 2022-01-21 DIAGNOSIS — N1831 Chronic kidney disease, stage 3a: Secondary | ICD-10-CM

## 2022-01-21 DIAGNOSIS — I5020 Unspecified systolic (congestive) heart failure: Secondary | ICD-10-CM

## 2022-01-21 DIAGNOSIS — Z86711 Personal history of pulmonary embolism: Secondary | ICD-10-CM

## 2022-01-21 DIAGNOSIS — E785 Hyperlipidemia, unspecified: Secondary | ICD-10-CM

## 2022-01-21 NOTE — Progress Notes (Signed)
Cardiology Office Note    Date:  01/21/2022   ID:  [name], DOB 12-30-1945, MRN [id number]  PCP:  [name], MD  Cardiologist: [name], MD    Chief Complaint  Patient presents with   Follow-up    3 month visit    History of Present Illness:    [name] is a 76 y.o. male with past medical history of CAD (s/p CABG in 2011, DES to RCA in 08/2017, cath in 06/2018 showing patent LIMA-LAD, SVG-OM1-OM2 and SVG-D1), HFrEF (EF 45-50% in 06/2012, 35-40% in 08/2017 with similar results in 06/2018, at 30-35% in 10/2020 and 35-40% in 08/2021), history of PE (on Coumadin), HTN, HLD and history of CVA who presents to the office today for 75-monthfollow-up.  He was last examined by Dr. RHarrington Challengerin 10/2021 and had recently been evaluated in the Emergency Department for chest discomfort which was felt to be overall atypical for a cardiac etiology as it was mostly right-sided and resolved with Rolaids. He was continued on his current medical therapy for his cardiomyopathy including Hydralazine, Imdur, Toprol-XL, Aldactone and Jardiance as he previously had angioedema with ACE-I which limited the use of ARB or Entresto.   He did have a recurrent Emergency Department visit on 01/10/2022 for evaluation of chest pain which occurred while watching television and resolved with IV Pepcid while in the ED. Cardiac enzymes were negative and BNP was normal. He was discharged home and informed to follow-up with Cardiology as an outpatient.  In talking with the patient today, he reports overall feeling well since his last office visit. He does walk around his apartment complex for exercise and denies any chest pain or progressive dyspnea with this. Says he does stop to rest when needed but no changes in this. He denies any recent orthopnea, PND or pitting edema. Reports good urination with taking Lasix 60 mg daily.   Past Medical History:  Diagnosis Date   Apical mural thrombus     Arteriosclerotic cardiovascular disease (ASCVD)    a. s/p CABG in 2011 b. DES to RCA in 08/2017 c. cath in 06/2018 showing patent LIMA-LAD, SVG-OM1-OM2 and SVG-D1   Chronic anticoagulation    Chronic systolic CHF (congestive heart failure) ([place]    a. EF 45-50% in 06/2012 b. 35-40% in 08/2017 with similar results in 06/2018 c. EF at 30-35% in 10/2020   [place]    HTN (hypertension)    Hyperlipidemia    Keloid    median sternotomy   [place]   Substance abuse ([place]    formerly cocaine    Past Surgical History:  Procedure Laterality Date   COLONOSCOPY N/A 01/22/2017   Procedure: COLONOSCOPY;  Surgeon: FDanie Binder MD;  [facility];  Service: Endoscopy;  Laterality: N/A;  200   CORONARY ARTERY BYPASS GRAFT  03/18/2010   LIMA-LAD, SVG to diagonal, OM1 & OM2   CORONARY STENT INTERVENTION N/A 08/21/2017   Procedure: CORONARY STENT INTERVENTION;  Surgeon: CSherren Mocha MD;  [facility];  Service: Cardiovascular;  Laterality: N/A;   LEFT HEART CATH AND CORS/GRAFTS ANGIOGRAPHY N/A 08/21/2017   Procedure: LEFT HEART CATH AND CORS/GRAFTS ANGIOGRAPHY;  Surgeon: CSherren Mocha MD;  [facility];  Service: Cardiovascular;  Laterality: N/A;   LEFT HEART CATH AND CORS/GRAFTS ANGIOGRAPHY N/A 06/21/2018   Procedure: LEFT HEART CATH AND CORS/GRAFTS ANGIOGRAPHY;  Surgeon: ENelva Bush MD;  [facility];  Service: Cardiovascular;  Laterality: N/A;   POLYPECTOMY  01/22/2017   Procedure: POLYPECTOMY;  Surgeon: Danie Binder, MD;  Location: [facility];  Service: Endoscopy;;  Transverse(CS) and sigmoid colon([facility])   PTCA  06/1996   LAD & RCA   TEE WITHOUT CARDIOVERSION N/A 06/10/2012   Procedure: TRANSESOPHAGEAL ECHOCARDIOGRAM (TEE);  Surgeon: Josue Hector, MD;  [facility];  Service: Cardiovascular;  Laterality: N/A;    Current Medications: Outpatient Medications  Prior to Visit  Medication Sig Dispense Refill   atorvastatin (LIPITOR) 40 MG tablet Take 1 tablet (40 mg total) by mouth daily at 6 PM. 30 tablet 0   clopidogrel (PLAVIX) 75 MG tablet Take 75 mg by mouth daily.     clotrimazole-betamethasone (LOTRISONE) cream APPLY TO AFFECTED AREA TWICE A DAY     cycloSPORINE (RESTASIS) 0.05 % ophthalmic emulsion Place 1 drop into both eyes daily as needed (dry eye).      empagliflozin (JARDIANCE) 10 MG TABS tablet Take 1 tablet (10 mg total) by mouth daily before breakfast. 30 tablet 11   fluticasone (FLONASE) 50 MCG/ACT nasal spray See admin instructions.     furosemide (LASIX) 20 MG tablet TAKE 3 TABLETS BY MOUTH EVERY DAY 270 tablet 3   hydrALAZINE (APRESOLINE) 50 MG tablet Take 1 tablet (50 mg total) by mouth 3 (three) times daily. 270 tablet 3   HYDROcodone-acetaminophen (NORCO) 5-325 MG tablet Take 1 tablet by mouth every 6 (six) hours as needed for severe pain. 10 tablet 0   isosorbide mononitrate (IMDUR) 60 MG 24 hr tablet Take 1 tablet (60 mg total) by mouth daily. 90 tablet 3   metoprolol succinate (TOPROL-XL) 50 MG 24 hr tablet Take 50 mg by mouth daily.  3   nitroGLYCERIN (NITROSTAT) 0.4 MG SL tablet Place 1 tablet (0.4 mg total) under the tongue every 5 (five) minutes as needed for chest pain. up to 3 doses. 25 tablet 4   spironolactone (ALDACTONE) 25 MG tablet Take 1 tablet (25 mg total) by mouth daily. 90 tablet 3   traMADol (ULTRAM) 50 MG tablet Take 50 mg by mouth 3 (three) times daily.     warfarin (COUMADIN) 5 MG tablet Take 1/2 tablet to 1 tablet daily or as directed 30 tablet 5   No facility-administered medications prior to visit.     Allergies:   Lisinopril   Social History   Socioeconomic History   Marital status: Widowed    Spouse name: Not on file   Number of children: Not on file   Years of education: Not on file   Highest education level: Not on file  Occupational History   Occupation: disability  Tobacco Use   Smoking  status: Former    Types: Cigarettes    Quit date: 2004    Years since quitting: 19.8   Smokeless tobacco: Never  Vaping Use   Vaping Use: Never used  Substance and Sexual Activity   Alcohol use: Not Currently    Comment: rare   Drug use: No    Types: Cocaine    Comment: Former cocaine abuse - last use for 51th birthday   Sexual activity: Not Currently  Other Topics Concern   Not on file  Social History Narrative   Not on file   Social Determinants of Health   Financial Resource Strain: [place]  (10/30/2020)   Overall Financial Resource Strain (CARDIA)    Difficulty of Paying Living Expenses: Not very hard  Food  Insecurity: No Food Insecurity (10/30/2020)   Hunger Vital Sign    Worried About Running Out of Food in the Last Year: Never true    Ran Out of Food in the Last Year: Never true  Transportation Needs: No Transportation Needs (10/30/2020)   PRAPARE - Hydrologist (Medical): No    Lack of Transportation (Non-Medical): No  Physical Activity: Insufficiently Active (10/30/2020)   Exercise Vital Sign    Days of Exercise per Week: 2 days    Minutes of Exercise per Session: 20 min  Stress: No Stress Concern Present (10/30/2020)   [place]    Feeling of Stress : Only a little  Social Connections: Moderately Isolated (10/30/2020)   Social Connection and Isolation Panel [NHANES]    Frequency of Communication with Friends and Family: Three times a week    Frequency of Social Gatherings with Friends and Family: Twice a week    Attends Religious Services: 1 to 4 times per year    Active Member of [employer] or Organizations: No    Attends Archivist Meetings: Never    Marital Status: Widowed     Family History:  The patient's family history includes Cancer (age of onset: 80) in his mother.   Review of Systems:    Please see the history of present illness.     All other  systems reviewed and are otherwise negative except as noted above.   Physical Exam:    VS:  BP 126/62   Pulse 60   Ht '5\' 6"'$  (1.676 m)   Wt 220 lb (99.8 kg)   SpO2 97%   BMI 35.51 kg/m    General: Well developed, well nourished,male appearing in no acute distress. Head: Normocephalic, atraumatic. Neck: No carotid bruits. JVD not elevated.  Lungs: Respirations regular and unlabored, without wheezes or rales.  Heart: Regular rate and rhythm. No S3 or S4.  No murmur, no rubs, or gallops appreciated. Abdomen: Appears non-distended. No obvious abdominal masses. Msk:  Strength and tone appear normal for age. No obvious joint deformities or effusions. Extremities: No clubbing or cyanosis. No pitting edema.  Distal pedal pulses are 2+ bilaterally. Neuro: Alert and oriented X 3. Moves all extremities spontaneously. No focal deficits noted. Psych:  Responds to questions appropriately with a normal affect. Skin: No rashes or lesions noted  Wt Readings from Last 3 Encounters:  01/21/22 220 lb (99.8 kg)  01/10/22 223 lb (101.2 kg)  10/15/21 222 lb 12.8 oz (101.1 kg)     Studies/Labs Reviewed:   EKG:  EKG is not ordered today.   Recent Labs: 08/19/2021: ALT 45 01/10/2022: B Natriuretic Peptide 94.0; BUN 15; Creatinine, Ser 1.37; Hemoglobin 16.3; Platelets 148; Potassium 3.9; Sodium 138   Lipid Panel    Component Value Date/Time   CHOL 82 07/17/2021 0838   TRIG 93 07/17/2021 0838   HDL 37 (L) 07/17/2021 0838   CHOLHDL 2.2 07/17/2021 0838   VLDL 19 07/17/2021 0838   LDLCALC 26 07/17/2021 0838    Additional studies/ records that were reviewed today include:   Cardiac Catheterization: 06/2018 Conclusions: Severe native coronary artery disease, including occluded proximal LAD and LCx.  RCA with patent mid/distal stents and mild in-stent restenosis. Widely patent LIMA-LAD, SVG-OM1-OM, and SVG-D. Mildly elevated left ventricular filling pressure.   Recommendations: Medical therapy;  question coronary embolism with spontaneous lysis.  Plan to restart heparin infusion 2 hours after TR band removal  and resumption of apixaban tomorrow AM if no evidence of bleeding. Continue clopidogrel 75 mg daily.  Defer continuing aspirin in the setting of long-term anticoagulation and clopidogrel use. Aggressive secondary prevention.  Limited Echo: 08/2021 IMPRESSIONS     1. Limited study.   2. Left ventricular ejection fraction, by estimation, is 35 to 40%. The  left ventricle has moderately decreased function. The left ventricle  demonstrates regional wall motion abnormalities (see scoring  diagram/findings for description).   3. Definity contrast shows slow flow at LV apex but no formed thrombus.   4. Right ventricular systolic function is normal. The right ventricular  size is normal.   5. The inferior vena cava is normal in size with greater than 50%  respiratory variability, suggesting right atrial pressure of 3 mmHg.   Assessment:    1. Coronary artery disease involving native coronary artery of native heart without angina pectoris   2. HFrEF (heart failure with reduced ejection fraction) ([place])   3. Ischemic cardiomyopathy   4. Essential hypertension   5. Hyperlipidemia LDL goal <70   6. Stage 3a chronic kidney disease ([place])   7. History of pulmonary embolism      Plan:   In order of problems listed above:  1. CAD - He is s/p CABG in 2011 with DES to RCA in 08/2017 and cath in 06/2018 showed a patent LIMA-LAD, SVG-OM1-OM2 and SVG-D1. - He has been increasing his activity level at home and denies any recent anginal symptoms. He did have a recent ED visit earlier this month during which symptoms were felt to be secondary to acid reflux. We reviewed the addition of PPI therapy but he prefers to use Rolaids as needed at this time. - Continue current medical therapy with Plavix 75 mg daily, Imdur 60 mg daily, Toprol-XL 50 mg daily and Atorvastatin 40 mg daily.  2.  HFrEF - His ejection fraction had improved to 35 to 40% by most recent echocardiogram in 08/2021. He appears euvolemic on examination today and reports his respiratory status has been stable. Continue current medical therapy for his cardiomyopathy with Jardiance 10 mg daily, Lasix 60 mg daily, Hydralazine 50 mg 3 times daily, Imdur 60 mg daily, Toprol-XL 50 mg daily and Spironolactone 25 mg daily. He has not been on an ACE-I/ARB/ARNI given angioedema with Lisinopril in the past. His creatinine was stable at 1.37 by recent labs on 01/10/2022.  3. HTN - His blood pressure is well-controlled at 126/62 during today's visit. Continue current medical therapy.  4. HLD - FLP in 07/2021 showed his LDL was well-controlled at 26. Continue Atorvastatin 40 mg daily.  5. Stage 3 CKD - Baseline creatinine 1.3 - 1.5. Stable at 1.37 by recent labs on 01/10/2022.  6. History of PE - Remains on Coumadin for anticoagulation. Denies any evidence of active bleeding. INR was at 2.8 on 01/14/2022.   Medication Adjustments/Labs and Tests Ordered: Current medicines are reviewed at length with the patient today.  Concerns regarding medicines are outlined above.  Medication changes, Labs and Tests ordered today are listed in the Patient Instructions below. Patient Instructions  Medication Instructions:  Your physician recommends that you continue on your current medications as directed. Please refer to the Current Medication list given to you today.   Labwork: None  Testing/Procedures: None  Follow-Up: Follow up with Dr. Harrington Challenger in 4-5 months.   Any Other Special Instructions Will Be Listed Below (If Applicable).  Call back if taking more Rolaids and wanting something else  for acid reflux.   If you need a refill on your cardiac medications before your next appointment, please call your pharmacy.    Signed, Erma Heritage, PA-C  01/21/2022 4:26 PM    [facility] [address] [place], [place] [address] Phone: [phone] Fax: [phone]

## 2022-01-21 NOTE — Patient Instructions (Addendum)
Medication Instructions:  Your physician recommends that you continue on your current medications as directed. Please refer to the Current Medication list given to you today.   Labwork: None  Testing/Procedures: None  Follow-Up: Follow up with Dr. Harrington Challenger in 4-5 months.   Any Other Special Instructions Will Be Listed Below (If Applicable).  Call back if taking more Rolaids and wanting something else for acid reflux.   If you need a refill on your cardiac medications before your next appointment, please call your pharmacy.

## 2022-02-25 ENCOUNTER — Ambulatory Visit: Payer: 59 | Attending: Internal Medicine | Admitting: *Deleted

## 2022-02-25 DIAGNOSIS — I513 Intracardiac thrombosis, not elsewhere classified: Secondary | ICD-10-CM

## 2022-02-25 DIAGNOSIS — Z8673 Personal history of transient ischemic attack (TIA), and cerebral infarction without residual deficits: Secondary | ICD-10-CM | POA: Diagnosis not present

## 2022-02-25 DIAGNOSIS — Z86711 Personal history of pulmonary embolism: Secondary | ICD-10-CM

## 2022-02-25 LAB — POCT INR: INR: 3.5 — AB (ref 2.0–3.0)

## 2022-02-25 NOTE — Patient Instructions (Signed)
Hold warfarin tonight then resume 1/2 a tablet daily except for 1 tablet on Sunday and Thursdays. Recheck INR in 4 weeks.

## 2022-03-25 ENCOUNTER — Ambulatory Visit: Payer: 59 | Attending: Internal Medicine | Admitting: *Deleted

## 2022-03-25 DIAGNOSIS — Z86711 Personal history of pulmonary embolism: Secondary | ICD-10-CM | POA: Diagnosis not present

## 2022-03-25 DIAGNOSIS — Z8673 Personal history of transient ischemic attack (TIA), and cerebral infarction without residual deficits: Secondary | ICD-10-CM | POA: Diagnosis not present

## 2022-03-25 DIAGNOSIS — I513 Intracardiac thrombosis, not elsewhere classified: Secondary | ICD-10-CM | POA: Diagnosis not present

## 2022-03-25 LAB — POCT INR: INR: 3.6 — AB (ref 2.0–3.0)

## 2022-03-25 NOTE — Patient Instructions (Signed)
Hold warfarin tonight then decrease dose to 1/2 a tablet daily except for 1 tablet on Sunday. Recheck INR in 4 weeks.

## 2022-03-28 ENCOUNTER — Other Ambulatory Visit: Payer: Self-pay | Admitting: Internal Medicine

## 2022-04-10 ENCOUNTER — Encounter (HOSPITAL_COMMUNITY): Payer: Self-pay

## 2022-04-10 ENCOUNTER — Other Ambulatory Visit: Payer: Self-pay | Admitting: Internal Medicine

## 2022-04-10 ENCOUNTER — Emergency Department (HOSPITAL_COMMUNITY)
Admission: EM | Admit: 2022-04-10 | Discharge: 2022-04-10 | Disposition: A | Discharge status: Home / Self Care | Payer: 59 | Attending: Emergency Medicine | Admitting: Emergency Medicine

## 2022-04-10 DIAGNOSIS — R040 Epistaxis: Secondary | ICD-10-CM | POA: Insufficient documentation

## 2022-04-10 DIAGNOSIS — Z7901 Long term (current) use of anticoagulants: Secondary | ICD-10-CM | POA: Diagnosis not present

## 2022-04-10 DIAGNOSIS — Z7902 Long term (current) use of antithrombotics/antiplatelets: Secondary | ICD-10-CM | POA: Insufficient documentation

## 2022-04-10 DIAGNOSIS — Z8673 Personal history of transient ischemic attack (TIA), and cerebral infarction without residual deficits: Secondary | ICD-10-CM

## 2022-04-10 MED ORDER — OXYMETAZOLINE HCL 0.05 % NA SOLN
1.0000 | Freq: Once | NASAL | Status: AC
  Administered 2022-04-10: 1 via NASAL
  Filled 2022-04-10: qty 30

## 2022-04-10 NOTE — ED Provider Notes (Signed)
[facility] EMERGENCY DEPARTMENT Provider Note   CSN: [id number] Arrival date & time: 04/10/22  1531     History  Chief Complaint  Patient presents with   Epistaxis    [name] is a 77 y.o. male.  77 year old male brought in by family with concern for bleeding from the left side nose which started about 1 hour prior to arrival when he was walking to the mailbox.  He thinks the cold air caused the nose to bleed.  It has since stopped bleeding.  Patient is on Coumadin.  No other complaints or concerns today.       Home Medications Prior to Admission medications   Medication Sig Start Date End Date Taking? Authorizing Provider  warfarin (COUMADIN) 5 MG tablet TAKE 1/2 TABLET TO 1 TABLET DAILY OR AS DIRECTED 04/10/22   Fay Records, MD  atorvastatin (LIPITOR) 40 MG tablet Take 1 tablet (40 mg total) by mouth daily at 6 PM. 03/09/17   Debbe Odea, MD  clopidogrel (PLAVIX) 75 MG tablet Take 75 mg by mouth daily.    [provider]  clotrimazole-betamethasone (LOTRISONE) cream APPLY TO AFFECTED AREA TWICE A DAY 01/30/21   [provider]  cycloSPORINE (RESTASIS) 0.05 % ophthalmic emulsion Place 1 drop into both eyes daily as needed (dry eye).     [provider]  fluticasone (FLONASE) 50 MCG/ACT nasal spray See admin instructions. 10/09/20   [provider]  furosemide (LASIX) 20 MG tablet TAKE 3 TABLETS BY MOUTH EVERY DAY 10/02/20   Fay Records, MD  hydrALAZINE (APRESOLINE) 50 MG tablet Take 1 tablet (50 mg total) by mouth 3 (three) times daily. 10/15/21 10/10/22  Fay Records, MD  HYDROcodone-acetaminophen (NORCO) 5-325 MG tablet Take 1 tablet by mouth every 6 (six) hours as needed for severe pain. 11/06/19   Sherwood Gambler, MD  isosorbide mononitrate (IMDUR) 60 MG 24 hr tablet Take 1 tablet (60 mg total) by mouth daily. 09/12/20 06/29/22  Fay Records, MD  JARDIANCE 10 MG TABS tablet TAKE 1 TABLET BY MOUTH DAILY BEFORE BREAKFAST. 03/28/22   Fay Records, MD  metoprolol succinate (TOPROL-XL) 50 MG 24 hr tablet Take 50 mg by mouth daily. 04/27/17   [provider]  nitroGLYCERIN (NITROSTAT) 0.4 MG SL tablet Place 1 tablet (0.4 mg total) under the tongue every 5 (five) minutes as needed for chest pain. up to 3 doses. 07/05/13   Lendon Colonel, NP  spironolactone (ALDACTONE) 25 MG tablet Take 1 tablet (25 mg total) by mouth daily. 06/28/21 06/23/22  Strader, Fransisco Hertz, PA-C  traMADol (ULTRAM) 50 MG tablet Take 50 mg by mouth 3 (three) times daily. 11/29/19   [provider]      Allergies    Lisinopril    Review of Systems   Review of Systems Negative except as per HPI Physical Exam Updated Vital Signs BP (!) 165/67   Pulse 72   Temp 97.7 F (36.5 C) (Oral)   Resp 18   Ht '5\' 6"'$  (1.676 m)   Wt 99.8 kg   SpO2 96%   BMI 35.51 kg/m  Physical Exam Vitals and nursing note reviewed.  Constitutional:      General: He is not in acute distress.    Appearance: He is well-developed. He is not diaphoretic.  HENT:     Head: Normocephalic and atraumatic.     Nose:     Comments: Small area of blood in left nostril, no active  bleeding Pulmonary:     Effort: Pulmonary effort is normal.  Skin:    General: Skin is warm and dry.     Findings: No erythema or rash.  Neurological:     Mental Status: He is alert and oriented to person, place, and time.  Psychiatric:        Behavior: Behavior normal.     ED Results / Procedures / Treatments   Labs (all labs ordered are listed, but only abnormal results are displayed) Labs Reviewed - No data to display  EKG None  Radiology No results found.  Procedures .Epistaxis Management  Date/Time: 04/10/2022 5:36 PM  Performed by: Tacy Learn, PA-C Authorized by: Tacy Learn, PA-C   Consent:    Consent obtained:  Verbal   Consent given by:  Patient   Risks discussed:  Bleeding and pain   Alternatives discussed:  No treatment Universal protocol:    Patient  identity confirmed:  Verbally with patient Anesthesia:    Anesthesia method:  None Procedure details:    Treatment site:  L anterior   Repair method: afrin.   Treatment complexity:  Limited   Treatment episode: initial   Post-procedure details:    Procedure completion:  Tolerated well, no immediate complications     Medications Ordered in ED Medications  oxymetazoline (AFRIN) 0.05 % nasal spray 1 spray (1 spray Each Nare Given 04/10/22 1727)    ED Course/ Medical Decision Making/ A&P                           Medical Decision Making Risk OTC drugs.   77 year old male presents with concern for bleeding from the left nare onset today when he walked outside to get the mail.  No injury to the nose.  Is on Coumadin and Plavix.  Arrives in the ER with trace amount of blood in the left nostril.  Afrin was applied, no further bleeding.  Is discharged with a bottle of Afrin and further instructions for management at home with return to ER precautions and recommend recheck with PCP if continues to have intermittent bleeding.  INR on file reviewed dated 03/25/2022 found to be 3.6.  As bleeding has stopped, doubt supratherapeutic INR at this time.        Final Clinical Impression(s) / ED Diagnoses Final diagnoses:  Left-sided epistaxis    Rx / DC Orders ED Discharge Orders     None         Tacy Learn, PA-C 04/10/22 1738    Hayden Rasmussen, MD 04/11/22 1009

## 2022-04-10 NOTE — Discharge Instructions (Addendum)
Recheck with your doctor if bleeding continues. If your nose starts to bleed again, blow your nose, spray the Afrin, pinch the soft part of the nose shot and hold pressure for 15 minutes.  If bleeding continues, return to the ER.

## 2022-04-10 NOTE — ED Triage Notes (Signed)
Pt c/o bleeding from left nare that started about 1 hour ago. Bleeding completely controlled at this time without pressure

## 2022-04-10 NOTE — Telephone Encounter (Signed)
Prescription refill request received for warfarin Lov: 01/21/22 Ahmed Prima)  Next INR check: 04/22/22 Warfarin tablet strength: '5mg'$   Appropriate dose and refill sent to requested pharmacy.

## 2022-04-23 ENCOUNTER — Ambulatory Visit: Payer: 59 | Attending: Internal Medicine | Admitting: *Deleted

## 2022-04-23 DIAGNOSIS — Z86711 Personal history of pulmonary embolism: Secondary | ICD-10-CM

## 2022-04-23 DIAGNOSIS — Z8673 Personal history of transient ischemic attack (TIA), and cerebral infarction without residual deficits: Secondary | ICD-10-CM | POA: Diagnosis not present

## 2022-04-23 DIAGNOSIS — I513 Intracardiac thrombosis, not elsewhere classified: Secondary | ICD-10-CM | POA: Diagnosis not present

## 2022-04-23 LAB — POCT INR: INR: 2.7 (ref 2.0–3.0)

## 2022-04-23 NOTE — Patient Instructions (Signed)
Continue warfarin 1/2 a tablet daily except for 1 tablet on Sunday. Recheck INR in 4 weeks.

## 2022-05-21 ENCOUNTER — Ambulatory Visit: Payer: 59 | Attending: Internal Medicine | Admitting: *Deleted

## 2022-05-21 DIAGNOSIS — Z86711 Personal history of pulmonary embolism: Secondary | ICD-10-CM | POA: Diagnosis not present

## 2022-05-21 DIAGNOSIS — Z8673 Personal history of transient ischemic attack (TIA), and cerebral infarction without residual deficits: Secondary | ICD-10-CM | POA: Diagnosis not present

## 2022-05-21 DIAGNOSIS — I513 Intracardiac thrombosis, not elsewhere classified: Secondary | ICD-10-CM | POA: Diagnosis not present

## 2022-05-21 LAB — POCT INR: INR: 2.8 (ref 2.0–3.0)

## 2022-05-21 NOTE — Patient Instructions (Signed)
Continue warfarin 1/2 a tablet daily except for 1 tablet on Sunday. Recheck INR in 5 weeks.

## 2022-06-05 ENCOUNTER — Encounter: Payer: Self-pay | Admitting: Radiology

## 2022-06-10 ENCOUNTER — Ambulatory Visit: Payer: 59 | Attending: Internal Medicine | Admitting: Internal Medicine

## 2022-06-10 ENCOUNTER — Encounter: Payer: Self-pay | Admitting: Internal Medicine

## 2022-06-10 VITALS — BP 114/68 | HR 68 | Ht 66.0 in | Wt 217.0 lb

## 2022-06-10 DIAGNOSIS — I2510 Atherosclerotic heart disease of native coronary artery without angina pectoris: Secondary | ICD-10-CM | POA: Diagnosis not present

## 2022-06-10 NOTE — Patient Instructions (Signed)
Medication Instructions:  Your physician recommends that you continue on your current medications as directed. Please refer to the Current Medication list given to you today.  *If you need a refill on your cardiac medications before your next appointment, please call your pharmacy*   Lab Work: NONE   If you have labs (blood work) drawn today and your tests are completely normal, you will receive your results only by: [place] (if you have MyChart) OR A paper copy in the mail If you have any lab test that is abnormal or we need to change your treatment, we will call you to review the results.   Testing/Procedures: NONE    Follow-Up: At [facility], you and your health needs are our priority.  As part of our continuing mission to provide you with exceptional heart care, we have created designated Provider Care Teams.  These Care Teams include your primary Cardiologist (physician) and Advanced Practice Providers (APPs -  Physician Assistants and Nurse Practitioners) who all work together to provide you with the care you need, when you need it.  We recommend signing up for the patient portal called "MyChart".  Sign up information is provided on this After Visit Summary.  MyChart is used to connect with patients for Virtual Visits (Telemedicine).  Patients are able to view lab/test results, encounter notes, upcoming appointments, etc.  Non-urgent messages can be sent to your provider as well.   To learn more about what you can do with MyChart, go to [email or web address].    Your next appointment:    December  Provider:   [name], MD    Other Instructions Thank you for choosing [facility]!

## 2022-06-10 NOTE — Progress Notes (Signed)
Cardiology Office Note   Date:  06/10/2022   ID:  Chirstopher, [name] [birth year], MRN [id number]  PCP:  [name], MD  Cardiologist:   [name], MD    Pt presents for f/u of CAD      History of Present Illness: [name] is a 77 y.o. male with a history of CAD   He is s/p CABG x 4 in 2011.  In May 2019 he had intervention to native RCA.   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    In 2020 he presented with NSTEMI   Had been off of anticoagulation.   Cath showed severe native CAD with occluded prox LAD and LCx   The RCA was patent, stents also patent.   The LIMA to LAD; SVG to OM1, SVG to Diag were all patent.   Given presentation there was a question of possible coronary embolism     Treated with heparin    Echo at the time showed LVEF 35 to 40% with akinesis of inferior, septal, apical walls  In July 2022 the pt was admitted for PE  Echo in July showed LVEF 30 to 35% with akinesis of the apex, distal infeiror, anteroseptal walls    Echo in May 2023 (limited)   LVEF was 35 to 40%  RVEF was normal  I saw the pt in July 2023  He was seen by [name] in Oct 2023  Since October he has done fairly well  No CP  Breathing is pretty good  No swelling    He is walking       Current Meds  Medication Sig   atorvastatin (LIPITOR) 40 MG tablet Take 1 tablet (40 mg total) by mouth daily at 6 PM.   clopidogrel (PLAVIX) 75 MG tablet Take 75 mg by mouth daily.   clotrimazole-betamethasone (LOTRISONE) cream APPLY TO AFFECTED AREA TWICE A DAY   cycloSPORINE (RESTASIS) 0.05 % ophthalmic emulsion Place 1 drop into both eyes daily as needed (dry eye).    fluticasone (FLONASE) 50 MCG/ACT nasal spray See admin instructions.   furosemide (LASIX) 20 MG tablet TAKE 3 TABLETS BY MOUTH EVERY DAY   hydrALAZINE (APRESOLINE) 50 MG tablet Take 1 tablet (50 mg total) by mouth 3 (three) times daily.   HYDROcodone-acetaminophen (NORCO) 5-325 MG tablet  Take 1 tablet by mouth every 6 (six) hours as needed for severe pain.   isosorbide mononitrate (IMDUR) 60 MG 24 hr tablet Take 1 tablet (60 mg total) by mouth daily.   JARDIANCE 10 MG TABS tablet TAKE 1 TABLET BY MOUTH DAILY BEFORE BREAKFAST.   metoprolol succinate (TOPROL-XL) 50 MG 24 hr tablet Take 50 mg by mouth daily.   nitroGLYCERIN (NITROSTAT) 0.4 MG SL tablet Place 1 tablet (0.4 mg total) under the tongue every 5 (five) minutes as needed for chest pain. up to 3 doses.   spironolactone (ALDACTONE) 25 MG tablet Take 1 tablet (25 mg total) by mouth daily.   traMADol (ULTRAM) 50 MG tablet Take 50 mg by mouth 3 (three) times daily.   warfarin (COUMADIN) 5 MG tablet TAKE 1/2 TABLET TO 1 TABLET DAILY OR AS DIRECTED     Allergies:   Lisinopril   Past Medical History:  Diagnosis Date   Apical mural thrombus    Arteriosclerotic cardiovascular disease (ASCVD)    a. s/p CABG in 2011 b. DES to RCA in 08/2017 c. cath in  06/2018 showing patent LIMA-LAD, SVG-OM1-OM2 and SVG-D1   Chronic anticoagulation    Chronic systolic CHF (congestive heart failure) ([place])    a. EF 45-50% in 06/2012 b. 35-40% in 08/2017 with similar results in 06/2018 c. EF at 30-35% in 10/2020   CVA (cerebral vascular accident) ([place])    HTN (hypertension)    Hyperlipidemia    Keloid    median sternotomy   Pulmonary embolism ([place]) 03/2010   Substance abuse ([place])    formerly cocaine    Past Surgical History:  Procedure Laterality Date   COLONOSCOPY N/A 01/22/2017   Procedure: COLONOSCOPY;  Surgeon: Danie Binder, MD;  Location: [facility];  Service: Endoscopy;  Laterality: N/A;  200   CORONARY ARTERY BYPASS GRAFT  03/18/2010   LIMA-LAD, SVG to diagonal, OM1 & OM2   CORONARY STENT INTERVENTION N/A 08/21/2017   Procedure: CORONARY STENT INTERVENTION;  Surgeon: [name], MD;  Location: [place] CV LAB;  Service: Cardiovascular;  Laterality: N/A;   LEFT HEART CATH AND CORS/GRAFTS ANGIOGRAPHY N/A 08/21/2017    Procedure: LEFT HEART CATH AND CORS/GRAFTS ANGIOGRAPHY;  Surgeon: [name], MD;  Location: [place] CV LAB;  Service: Cardiovascular;  Laterality: N/A;   LEFT HEART CATH AND CORS/GRAFTS ANGIOGRAPHY N/A 06/21/2018   Procedure: LEFT HEART CATH AND CORS/GRAFTS ANGIOGRAPHY;  Surgeon: Nelva Bush, MD;  Location: [place] CV LAB;  Service: Cardiovascular;  Laterality: N/A;   POLYPECTOMY  01/22/2017   Procedure: POLYPECTOMY;  Surgeon: Danie Binder, MD;  Location: [facility];  Service: Endoscopy;;  Transverse(CS) and sigmoid colon(HS)   PTCA  06/1996   LAD & RCA   TEE WITHOUT CARDIOVERSION N/A 06/10/2012   Procedure: TRANSESOPHAGEAL ECHOCARDIOGRAM (TEE);  Surgeon: Josue Hector, MD;  Location: [facility];  Service: Cardiovascular;  Laterality: N/A;     Social History:  The patient  reports that he quit smoking about 20 years ago. His smoking use included cigarettes. He has never used smokeless tobacco. He reports that he does not currently use alcohol. He reports that he does not use drugs.   Family History:  The patient's family history includes Cancer (age of onset: 62) in his mother.    ROS:  Please see the history of present illness. All other systems are reviewed and  Negative to the above problem except as noted.    PHYSICAL EXAM: VS:  BP 114/68   Pulse 68   Ht '5\' 6"'$  (1.676 m)   Wt 217 lb (98.4 kg)   SpO2 95%   BMI 35.02 kg/m   GEN: Obese 77 yo in no acute distress  HEENT: normal  Neck: no JVD  No bruit    Cardiac: RRR; no murmur.   No LE edema  Respiratory:  clear to auscultation GI: soft, nontender No hepatomegaly     EKG:  EKG shows SR 68   rSR' V1   Possible anteior MI  Possible inferior MI    Echo   May 2023    1. Limited study.   2. Left ventricular ejection fraction, by estimation, is 35 to 40%. The  left ventricle has moderately decreased function. The left ventricle  demonstrates regional wall motion abnormalities (see scoring   diagram/findings for description).   3. Definity contrast shows slow flow at LV apex but no formed thrombus.   4. Right ventricular systolic function is normal. The right ventricular  size is normal.   5. The inferior vena cava is normal in size with greater  than 50%  respiratory variability, suggesting right atrial pressure of 3 mmHg.   Echo  July 2022 1. Left ventricular ejection fraction, by estimation, is 30 to 35%. The left ventricle has normal function. Left ventricular endocardial border not optimally defined to evaluate regional wall motion. Left ventricular diastolic parameters were normal. The entire apex is akinetic, there is some stasis of blood flow in the apex without clear thrombus. The distal inferior, anteroseptal walls are akinetic. The inferior wall is hypokinetic. 2. Right ventricular systolic function was not well visualized. The right ventricular size is not well visualized. There is moderately elevated pulmonary artery systolic pressure. 3. The mitral valve is normal in structure. No evidence of mitral valve regurgitation. No evidence of mitral stenosis. 4. The aortic valve is tricuspid. Aortic valve regurgitation is not visualized. No aortic stenosis is present. 5. The inferior vena cava is normal in size with greater than 50% respiratory variabil  Cath; The following studies were reviewed today: Cath March 2020- Conclusions: Severe native coronary artery disease, including occluded proximal LAD and LCx.  RCA with patent mid/distal stents and mild in-stent restenosis. Widely patent LIMA-LAD, SVG-OM1-OM, and SVG-D. Mildly elevated left ventricular filling pressure.   Recommendations: Medical therapy; question coronary embolism with spontaneous lysis.  Plan to restart heparin infusion 2 hours after TR band removal and resumption of apixaban tomorrow AM if no evidence of bleeding. Continue clopidogrel 75 mg daily.  Defer continuing aspirin in the setting of  long-term anticoagulation and clopidogrel use. Aggressive secondary prevention.    Lipid Panel    Component Value Date/Time   CHOL 82 07/17/2021 0838   TRIG 93 07/17/2021 0838   HDL 37 (L) 07/17/2021 0838   CHOLHDL 2.2 07/17/2021 0838   VLDL 19 07/17/2021 0838   LDLCALC 26 07/17/2021 0838      Wt Readings from Last 3 Encounters:  06/10/22 217 lb (98.4 kg)  04/10/22 220 lb (99.8 kg)  01/21/22 220 lb (99.8 kg)      ASSESSMENT AND PLAN:  1  CAD   Lasat cath in 2020   Pt with severe native dz  Patent grafts    Remains symptom free       2  Hx systolic CHF   Last echo in May   LVEF 35 to 40%   Volume status is good    BP is good  Keep on Hydralazine, Imdur, Toprol XL, Aldactone, Jardiance.     Had angioedema with ACE I / ARB    3  Hx PE  Keep on coumadin    4  Hx of mural LV thrombus  Akinetic region at apex  Remains on coumadin    5  Hx R brain CVA  2014  Continue anticoagulation     6  HTN   BP is controlled     7  HL  On lipitor   LDL 26, HDL 37   Trig 93     8  CKD   Cr 1.37 in October      Follow up in Dec 2024     Current medicines are reviewed at length with the patient today.  The patient does not have concerns regarding medicines.  Signed, [name], MD  06/10/2022 1:39 PM    [facility] [place], [place], [place]  [address] Phone: [phone]; Fax: [phone]

## 2022-06-25 ENCOUNTER — Ambulatory Visit: Payer: 59 | Attending: Internal Medicine

## 2022-06-25 DIAGNOSIS — Z8673 Personal history of transient ischemic attack (TIA), and cerebral infarction without residual deficits: Secondary | ICD-10-CM

## 2022-06-25 DIAGNOSIS — I513 Intracardiac thrombosis, not elsewhere classified: Secondary | ICD-10-CM | POA: Diagnosis not present

## 2022-06-25 DIAGNOSIS — Z86711 Personal history of pulmonary embolism: Secondary | ICD-10-CM | POA: Diagnosis not present

## 2022-06-25 LAB — POCT INR: INR: 2.4 (ref 2.0–3.0)

## 2022-06-25 NOTE — Patient Instructions (Signed)
Description   Continue warfarin 1/2 a tablet daily except for 1 tablet on Sunday. Recheck INR in 6 weeks.

## 2022-06-30 ENCOUNTER — Other Ambulatory Visit (HOSPITAL_COMMUNITY)
Admission: RE | Admit: 2022-06-30 | Discharge: 2022-06-30 | Disposition: A | Discharge status: Home / Self Care | Payer: 59 | Source: Ambulatory Visit | Attending: Internal Medicine | Admitting: Internal Medicine

## 2022-06-30 DIAGNOSIS — Z0001 Encounter for general adult medical examination with abnormal findings: Secondary | ICD-10-CM | POA: Diagnosis present

## 2022-06-30 DIAGNOSIS — I10 Essential (primary) hypertension: Secondary | ICD-10-CM | POA: Diagnosis not present

## 2022-06-30 DIAGNOSIS — G4733 Obstructive sleep apnea (adult) (pediatric): Secondary | ICD-10-CM | POA: Diagnosis not present

## 2022-06-30 LAB — CBC WITH DIFFERENTIAL/PLATELET
Abs Immature Granulocytes: 0.01 10*3/uL (ref 0.00–0.07)
Basophils Absolute: 0.1 10*3/uL (ref 0.0–0.1)
Basophils Relative: 1 %
Eosinophils Absolute: 0.2 10*3/uL (ref 0.0–0.5)
Eosinophils Relative: 3 %
HCT: 52.3 % — ABNORMAL HIGH (ref 39.0–52.0)
Hemoglobin: 16.7 g/dL (ref 13.0–17.0)
Immature Granulocytes: 0 %
Lymphocytes Relative: 41 %
Lymphs Abs: 2.1 10*3/uL (ref 0.7–4.0)
MCH: 29 pg (ref 26.0–34.0)
MCHC: 31.9 g/dL (ref 30.0–36.0)
MCV: 90.8 fL (ref 80.0–100.0)
Monocytes Absolute: 0.8 10*3/uL (ref 0.1–1.0)
Monocytes Relative: 15 %
Neutro Abs: 2 10*3/uL (ref 1.7–7.7)
Neutrophils Relative %: 40 %
Platelets: 176 10*3/uL (ref 150–400)
RBC: 5.76 MIL/uL (ref 4.22–5.81)
RDW: 13.2 % (ref 11.5–15.5)
WBC: 5.2 10*3/uL (ref 4.0–10.5)
nRBC: 0 % (ref 0.0–0.2)

## 2022-06-30 LAB — HEPATIC FUNCTION PANEL
ALT: 61 U/L — ABNORMAL HIGH (ref 0–44)
AST: 38 U/L (ref 15–41)
Albumin: 4.3 g/dL (ref 3.5–5.0)
Alkaline Phosphatase: 59 U/L (ref 38–126)
Bilirubin, Direct: 0.3 mg/dL — ABNORMAL HIGH (ref 0.0–0.2)
Indirect Bilirubin: 1.5 mg/dL — ABNORMAL HIGH (ref 0.3–0.9)
Total Bilirubin: 1.8 mg/dL — ABNORMAL HIGH (ref 0.3–1.2)
Total Protein: 7.8 g/dL (ref 6.5–8.1)

## 2022-06-30 LAB — BASIC METABOLIC PANEL
Anion gap: 9 (ref 5–15)
BUN: 15 mg/dL (ref 8–23)
CO2: 25 mmol/L (ref 22–32)
Calcium: 8.8 mg/dL — ABNORMAL LOW (ref 8.9–10.3)
Chloride: 101 mmol/L (ref 98–111)
Creatinine, Ser: 1.45 mg/dL — ABNORMAL HIGH (ref 0.61–1.24)
GFR, Estimated: 50 mL/min — ABNORMAL LOW (ref >60)
Glucose, Bld: 75 mg/dL (ref 70–99)
Potassium: 4.3 mmol/L (ref 3.5–5.1)
Sodium: 135 mmol/L (ref 135–145)

## 2022-06-30 LAB — LIPID PANEL
Cholesterol: 77 mg/dL (ref 0–200)
HDL: 38 mg/dL — ABNORMAL LOW (ref >40)
LDL Cholesterol: 24 mg/dL (ref 0–99)
Total CHOL/HDL Ratio: 2 RATIO
Triglycerides: 77 mg/dL (ref <150)
VLDL: 15 mg/dL (ref 0–40)

## 2022-07-01 ENCOUNTER — Observation Stay (HOSPITAL_BASED_OUTPATIENT_CLINIC_OR_DEPARTMENT_OTHER): Payer: 59

## 2022-07-01 ENCOUNTER — Observation Stay (HOSPITAL_COMMUNITY)
Admission: EM | Admit: 2022-07-01 | Discharge: 2022-07-01 | Disposition: A | Discharge status: Home / Self Care | Payer: 59 | Attending: Internal Medicine | Admitting: Internal Medicine

## 2022-07-01 ENCOUNTER — Emergency Department (HOSPITAL_COMMUNITY): Payer: 59

## 2022-07-01 ENCOUNTER — Other Ambulatory Visit: Payer: Self-pay

## 2022-07-01 ENCOUNTER — Observation Stay (HOSPITAL_COMMUNITY): Payer: 59

## 2022-07-01 ENCOUNTER — Observation Stay (HOSPITAL_COMMUNITY)
Admit: 2022-07-01 | Discharge: 2022-07-01 | Disposition: A | Discharge status: Home / Self Care | Payer: 59 | Attending: Neurology | Admitting: Neurology

## 2022-07-01 ENCOUNTER — Encounter (HOSPITAL_COMMUNITY): Payer: Self-pay | Admitting: Emergency Medicine

## 2022-07-01 DIAGNOSIS — R202 Paresthesia of skin: Secondary | ICD-10-CM | POA: Diagnosis not present

## 2022-07-01 DIAGNOSIS — N289 Disorder of kidney and ureter, unspecified: Secondary | ICD-10-CM | POA: Diagnosis not present

## 2022-07-01 DIAGNOSIS — N1831 Chronic kidney disease, stage 3a: Secondary | ICD-10-CM | POA: Diagnosis not present

## 2022-07-01 DIAGNOSIS — Z7952 Long term (current) use of systemic steroids: Secondary | ICD-10-CM | POA: Diagnosis not present

## 2022-07-01 DIAGNOSIS — R2990 Unspecified symptoms and signs involving the nervous system: Secondary | ICD-10-CM

## 2022-07-01 DIAGNOSIS — Z955 Presence of coronary angioplasty implant and graft: Secondary | ICD-10-CM | POA: Insufficient documentation

## 2022-07-01 DIAGNOSIS — E782 Mixed hyperlipidemia: Secondary | ICD-10-CM | POA: Diagnosis present

## 2022-07-01 DIAGNOSIS — I639 Cerebral infarction, unspecified: Secondary | ICD-10-CM | POA: Diagnosis not present

## 2022-07-01 DIAGNOSIS — Z8673 Personal history of transient ischemic attack (TIA), and cerebral infarction without residual deficits: Secondary | ICD-10-CM | POA: Insufficient documentation

## 2022-07-01 DIAGNOSIS — Z87891 Personal history of nicotine dependence: Secondary | ICD-10-CM | POA: Diagnosis not present

## 2022-07-01 DIAGNOSIS — I6389 Other cerebral infarction: Principal | ICD-10-CM | POA: Insufficient documentation

## 2022-07-01 DIAGNOSIS — I2510 Atherosclerotic heart disease of native coronary artery without angina pectoris: Secondary | ICD-10-CM | POA: Insufficient documentation

## 2022-07-01 DIAGNOSIS — Z79899 Other long term (current) drug therapy: Secondary | ICD-10-CM | POA: Diagnosis not present

## 2022-07-01 DIAGNOSIS — I10 Essential (primary) hypertension: Secondary | ICD-10-CM | POA: Diagnosis present

## 2022-07-01 DIAGNOSIS — I5022 Chronic systolic (congestive) heart failure: Secondary | ICD-10-CM | POA: Diagnosis not present

## 2022-07-01 DIAGNOSIS — I5042 Chronic combined systolic (congestive) and diastolic (congestive) heart failure: Secondary | ICD-10-CM | POA: Insufficient documentation

## 2022-07-01 DIAGNOSIS — Z7901 Long term (current) use of anticoagulants: Secondary | ICD-10-CM | POA: Insufficient documentation

## 2022-07-01 DIAGNOSIS — I130 Hypertensive heart and chronic kidney disease with heart failure and stage 1 through stage 4 chronic kidney disease, or unspecified chronic kidney disease: Secondary | ICD-10-CM | POA: Diagnosis not present

## 2022-07-01 DIAGNOSIS — I2699 Other pulmonary embolism without acute cor pulmonale: Secondary | ICD-10-CM | POA: Diagnosis present

## 2022-07-01 DIAGNOSIS — R200 Anesthesia of skin: Secondary | ICD-10-CM | POA: Diagnosis present

## 2022-07-01 DIAGNOSIS — Z951 Presence of aortocoronary bypass graft: Secondary | ICD-10-CM | POA: Diagnosis not present

## 2022-07-01 DIAGNOSIS — Z86711 Personal history of pulmonary embolism: Secondary | ICD-10-CM

## 2022-07-01 DIAGNOSIS — R209 Unspecified disturbances of skin sensation: Secondary | ICD-10-CM | POA: Insufficient documentation

## 2022-07-01 DIAGNOSIS — R29701 NIHSS score 1: Secondary | ICD-10-CM | POA: Insufficient documentation

## 2022-07-01 DIAGNOSIS — Z7902 Long term (current) use of antithrombotics/antiplatelets: Secondary | ICD-10-CM | POA: Insufficient documentation

## 2022-07-01 LAB — LIPID PANEL
Cholesterol: 74 mg/dL (ref 0–200)
HDL: 36 mg/dL — ABNORMAL LOW (ref >40)
LDL Cholesterol: 1 mg/dL (ref 0–99)
Total CHOL/HDL Ratio: 2.1 RATIO
Triglycerides: 183 mg/dL — ABNORMAL HIGH (ref <150)
VLDL: 37 mg/dL (ref 0–40)

## 2022-07-01 LAB — DIFFERENTIAL
Abs Immature Granulocytes: 0.01 10*3/uL (ref 0.00–0.07)
Basophils Absolute: 0 10*3/uL (ref 0.0–0.1)
Basophils Relative: 1 %
Eosinophils Absolute: 0.2 10*3/uL (ref 0.0–0.5)
Eosinophils Relative: 3 %
Immature Granulocytes: 0 %
Lymphocytes Relative: 52 %
Lymphs Abs: 3.6 10*3/uL (ref 0.7–4.0)
Monocytes Absolute: 0.8 10*3/uL (ref 0.1–1.0)
Monocytes Relative: 11 %
Neutro Abs: 2.3 10*3/uL (ref 1.7–7.7)
Neutrophils Relative %: 33 %

## 2022-07-01 LAB — CBG MONITORING, ED: Glucose-Capillary: 94 mg/dL (ref 70–99)

## 2022-07-01 LAB — ETHANOL: Alcohol, Ethyl (B): <10 mg/dL (ref <10)

## 2022-07-01 LAB — URINALYSIS, ROUTINE W REFLEX MICROSCOPIC
Bacteria, UA: NONE SEEN
Bilirubin Urine: NEGATIVE
Glucose, UA: >=500 mg/dL — AB
Hgb urine dipstick: NEGATIVE
Ketones, ur: NEGATIVE mg/dL
Leukocytes,Ua: NEGATIVE
Nitrite: NEGATIVE
Protein, ur: 30 mg/dL — AB
Specific Gravity, Urine: 1.021 (ref 1.005–1.030)
pH: 5 (ref 5.0–8.0)

## 2022-07-01 LAB — CBC
HCT: 51.2 % (ref 39.0–52.0)
Hemoglobin: 16.4 g/dL (ref 13.0–17.0)
MCH: 29.4 pg (ref 26.0–34.0)
MCHC: 32 g/dL (ref 30.0–36.0)
MCV: 91.8 fL (ref 80.0–100.0)
Platelets: 160 10*3/uL (ref 150–400)
RBC: 5.58 MIL/uL (ref 4.22–5.81)
RDW: 13 % (ref 11.5–15.5)
WBC: 6.9 10*3/uL (ref 4.0–10.5)
nRBC: 0 % (ref 0.0–0.2)

## 2022-07-01 LAB — COMPREHENSIVE METABOLIC PANEL
ALT: 56 U/L — ABNORMAL HIGH (ref 0–44)
AST: 40 U/L (ref 15–41)
Albumin: 4 g/dL (ref 3.5–5.0)
Alkaline Phosphatase: 69 U/L (ref 38–126)
Anion gap: 7 (ref 5–15)
BUN: 18 mg/dL (ref 8–23)
CO2: 25 mmol/L (ref 22–32)
Calcium: 8.6 mg/dL — ABNORMAL LOW (ref 8.9–10.3)
Chloride: 103 mmol/L (ref 98–111)
Creatinine, Ser: 1.49 mg/dL — ABNORMAL HIGH (ref 0.61–1.24)
GFR, Estimated: 48 mL/min — ABNORMAL LOW (ref >60)
Glucose, Bld: 99 mg/dL (ref 70–99)
Potassium: 4 mmol/L (ref 3.5–5.1)
Sodium: 135 mmol/L (ref 135–145)
Total Bilirubin: 1.2 mg/dL (ref 0.3–1.2)
Total Protein: 7.4 g/dL (ref 6.5–8.1)

## 2022-07-01 LAB — RAPID URINE DRUG SCREEN, HOSP PERFORMED
Amphetamines: NOT DETECTED
Barbiturates: NOT DETECTED
Benzodiazepines: NOT DETECTED
Cocaine: NOT DETECTED
Opiates: NOT DETECTED
Tetrahydrocannabinol: NOT DETECTED

## 2022-07-01 LAB — MAGNESIUM: Magnesium: 2.2 mg/dL (ref 1.7–2.4)

## 2022-07-01 LAB — ECHOCARDIOGRAM COMPLETE
Area-P 1/2: 2.34 cm2
Height: 66 in
S' Lateral: 3.9 cm
Weight: 3375.68 oz

## 2022-07-01 LAB — PROTIME-INR
INR: 2.7 — ABNORMAL HIGH (ref 0.8–1.2)
Prothrombin Time: 28.2 seconds — ABNORMAL HIGH (ref 11.4–15.2)

## 2022-07-01 LAB — PHOSPHORUS: Phosphorus: 3.9 mg/dL (ref 2.5–4.6)

## 2022-07-01 LAB — APTT: aPTT: 45 seconds — ABNORMAL HIGH (ref 24–36)

## 2022-07-01 MED ORDER — PERFLUTREN LIPID MICROSPHERE
1.0000 mL | INTRAVENOUS | Status: AC | PRN
  Administered 2022-07-01: 10 mL via INTRAVENOUS

## 2022-07-01 MED ORDER — ONDANSETRON HCL 4 MG/2ML IJ SOLN: 4.0000 mg | Freq: Four times a day (QID) | INTRAMUSCULAR | Status: DC | PRN

## 2022-07-01 MED ORDER — ATORVASTATIN CALCIUM 40 MG PO TABS
40.0000 mg | ORAL_TABLET | Freq: Every day | ORAL | Status: DC
  Administered 2022-07-01: 40 mg via ORAL
  Filled 2022-07-01: qty 1

## 2022-07-01 MED ORDER — ACETAMINOPHEN 650 MG RE SUPP: 650.0000 mg | Freq: Four times a day (QID) | RECTAL | Status: DC | PRN

## 2022-07-01 MED ORDER — ACETAMINOPHEN 325 MG PO TABS: 650.0000 mg | ORAL_TABLET | Freq: Four times a day (QID) | ORAL | Status: DC | PRN

## 2022-07-01 MED ORDER — ONDANSETRON HCL 4 MG PO TABS: 4.0000 mg | ORAL_TABLET | Freq: Four times a day (QID) | ORAL | Status: DC | PRN

## 2022-07-01 MED ORDER — WARFARIN - PHARMACIST DOSING INPATIENT: Freq: Every day | Status: DC

## 2022-07-01 MED ORDER — WARFARIN SODIUM 2.5 MG PO TABS
2.5000 mg | ORAL_TABLET | Freq: Once | ORAL | Status: AC
  Administered 2022-07-01: 2.5 mg via ORAL
  Filled 2022-07-01: qty 1

## 2022-07-01 MED ORDER — WARFARIN SODIUM 5 MG PO TABS: 5.0000 mg | ORAL_TABLET | Freq: Every day | ORAL | Status: DC

## 2022-07-01 MED ORDER — CLOPIDOGREL BISULFATE 75 MG PO TABS
75.0000 mg | ORAL_TABLET | Freq: Every day | ORAL | Status: DC
  Administered 2022-07-01: 75 mg via ORAL
  Filled 2022-07-01: qty 1

## 2022-07-01 NOTE — Progress Notes (Signed)
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  The patient is a difficult historian.  However he states that he had numbness in his left arm about 2 days prior to this admission.  He felt this has been constant.  He subsequently developed facial numbness on the evening of 06/30/2022.  As result, he came to the emergency department for further evaluation.  He denies any headache, word finding difficulty, slurred speech, headache.  He has not had any neck pain or back pain.  He denies any fevers, chills, chest pain, shortness breath, cough, hemoptysis, nausea, vomiting, direct abdominal pain. In the ED, the patient was afebrile and hemodynamically stable with oxygen saturation 98% on room air.  WBC 6.9, hemoglobin 16.4, platelets 160,000.  Sodium 135, potassium 4.0, bicarbonate 25, serum creatinine 1.49.  INR 2.7 CT of the brain was negative for any acute abnormalities but showed an old left frontal, left occipital, and right cerebellar infarcts.  He was seen by teleneurology who recommended further evaluation and treatment.  The patient was admitted for further evaluation and treatment.     Assessment/Plan: Sensory disturbance -Concerning for stroke -Symptoms are improving, but not completely resolved --Appreciate Neurology Consult -PT/OT evaluation -Speech therapy eval -CT brain--neg -MRI brain-- -MRA brain-- -Echo-- -LDL-- -HbA1C-- -Antiplatelet--  Chronic HFpEF -Patient is clinically euvolemic -Continue home doses of hydralazine, Imdur, Jardiance, -Holding spironolactone, metoprolol succinate to allow for permissive hypertension -08/28/2021 echo EF 35-40%, no  LV thrombus, normal RV function, +WMA  CKD stage IIIa -Baseline creatinine 1.3-1.5  Mixed hyperlipidemia -Continue statin  Coronary artery disease -Holding Plavix  History of pulmonary embolus -Continue warfarin -Pharmacy assisting in dosing  Essential hypertension -BP is controlled  History of LV mural thrombus -Remains on warfarin    Family Communication: no  Family at bedside  Consultants:  neurology  Code Status:  FULL   DVT Prophylaxis:  warfarin   Procedures: As Listed in Progress Note Above  Antibiotics: None       Subjective:  Patient feels that his left face and left arm numbness are improving but not 100% better. Patient denies fevers, chills, headache, chest pain, dyspnea, nausea, vomiting, diarrhea, abdominal pain, dysuria, hematuria, hematochezia, and melena.  Objective: Vitals:   07/01/22 0400 07/01/22 0430 07/01/22 0500 07/01/22 0530  BP: 139/63 (!) 115/57 (!) 116/59   Pulse: (!) 44 60 (!) 52 70  Resp: 19 17 16 16   Temp:      TempSrc:      SpO2: 95% 95% 97% 98%  Weight:      Height:       No intake or output data in the 24 hours ending 07/01/22 0705 Weight change:  Exam:  General:  Pt is alert, follows commands appropriately, not in acute distress HEENT: No icterus, No thrush, No neck mass, [place]/AT Cardiovascular: RRR, S1/S2, no rubs, no gallops Respiratory: CTA bilaterally, no wheezing, no crackles, no rhonchi Abdomen: Soft/+BS, non tender, non distended, no guarding Extremities: No edema, No lymphangitis, No petechiae, No rashes, no synovitis Neuro:  CN II-XII intact, strength 4/5 in RUE, RLE, strength 4/5 LUE, LLE; sensation intact bilateral; no dysmetria; babinski equivocal    Data  Reviewed: I have personally reviewed following labs and imaging studies Basic Metabolic Panel: Recent Labs  Lab 06/30/22 1108 07/01/22 0038  NA 135 135  K 4.3 4.0  CL 101 103  CO2 25 25  GLUCOSE 75 99  BUN 15 18  CREATININE 1.45* 1.49*   CALCIUM 8.8* 8.6*   Liver Function Tests: Recent Labs  Lab 06/30/22 1108 07/01/22 0038  AST 38 40  ALT 61* 56*  ALKPHOS 59 69  BILITOT 1.8* 1.2  PROT 7.8 7.4  ALBUMIN 4.3 4.0   No results for input(s): "LIPASE", "AMYLASE" in the last 168 hours. No results for input(s): "AMMONIA" in the last 168 hours. Coagulation Profile: Recent Labs  Lab 06/25/22 0923 07/01/22 0038  INR 2.4 2.7*   CBC: Recent Labs  Lab 06/30/22 1108 07/01/22 0038  WBC 5.2 6.9  NEUTROABS 2.0 2.3  HGB 16.7 16.4  HCT 52.3* 51.2  MCV 90.8 91.8  PLT 176 160   Cardiac Enzymes: No results for input(s): "CKTOTAL", "CKMB", "CKMBINDEX", "TROPONINI" in the last 168 hours. BNP: Invalid input(s): "POCBNP" CBG: Recent Labs  Lab 07/01/22 0048  GLUCAP 94   HbA1C: No results for input(s): "HGBA1C" in the last 72 hours. Urine analysis:    Component Value Date/Time   COLORURINE YELLOW 07/01/2022 [place] 07/01/2022 0135   LABSPEC 1.021 07/01/2022 0135   PHURINE 5.0 07/01/2022 0135   GLUCOSEU >=500 (A) 07/01/2022 0135   HGBUR NEGATIVE 07/01/2022 0135   BILIRUBINUR NEGATIVE 07/01/2022 0135   KETONESUR NEGATIVE 07/01/2022 0135   PROTEINUR 30 (A) 07/01/2022 0135   UROBILINOGEN 1.0 03/16/2010 0652   NITRITE NEGATIVE 07/01/2022 0135   LEUKOCYTESUR NEGATIVE 07/01/2022 0135   Sepsis Labs: @LABRCNTIP (procalcitonin:4,lacticidven:4) )No results found for this or any previous visit (from the past 240 hour(s)).   Scheduled Meds: Continuous Infusions:  Procedures/Studies: CT HEAD CODE STROKE WO CONTRAST  Result Date: 07/01/2022 CLINICAL DATA:  Code stroke.  Left facial numbness EXAM: CT HEAD WITHOUT CONTRAST TECHNIQUE: Contiguous axial images were obtained from the base of the skull through the vertex without intravenous contrast. RADIATION DOSE REDUCTION: This exam was performed according to the departmental dose-optimization program which includes automated exposure control, adjustment of  the mA and/or kV according to patient size and/or use of iterative reconstruction technique. COMPARISON:  09/16/2020 FINDINGS: Brain: There is no mass, hemorrhage or extra-axial collection. The size and configuration of the ventricles and extra-axial CSF spaces are normal. There are old left frontal, left occipital and right cerebellar infarcts Vascular: No abnormal hyperdensity of the major intracranial arteries or dural venous sinuses. No intracranial atherosclerosis. Skull: The visualized skull base, calvarium and extracranial soft tissues are normal. Sinuses/Orbits: No fluid levels or advanced mucosal thickening of the visualized paranasal sinuses. No mastoid or middle ear effusion. The orbits are normal. ASPECTS [facility] Stroke Program Early CT Score) - Ganglionic level infarction (caudate, lentiform nuclei, internal capsule, insula, M1-M3 cortex): 7 - Supraganglionic infarction (M4-M6 cortex): 3 Total score (0-10 with 10 being normal): 10 IMPRESSION: 1. No acute intracranial abnormality. 2. Old left frontal, left occipital and right cerebellar infarcts. 3. ASPECTS is 10. These results were called by telephone at the time of interpretation on 07/01/2022 at 12:47 am to provider Jaccob Czaplicki [facility] , who verbally acknowledged these results. Electronically Signed   By: Ulyses Jarred M.D.   On: 07/01/2022 00:49    Orson Eva, DO  [facility]  If 7PM-7AM, please contact night-coverage [email or web address] Password [email or web address] 07/01/2022, 7:05 AM   LOS: 0  days

## 2022-07-01 NOTE — Consult Note (Signed)
[facility] TeleNeurology Consult Services   Patient Name:   [name], [name] Date of Birth:   01-Dec-1945 Identification Number:   MRN - [id number] Date of Service:   07/01/2022 00:43:51  Diagnosis:       I63.89 - Cerebrovascular accident (CVA) due to other mechanism [facility])  Impression:      77 yo man presenting for left sided numbness. Symptoms are minimal but I do think he will need to be admitted for further workup including brain MRI. Hold coumadin for now; reinitiation to be decided on MRI results. Continue plavix.  Our recommendations are outlined below.  Recommendations:        Stroke/Telemetry Floor       Neuro Checks       Bedside Swallow Eval       DVT Prophylaxis       IV Fluids, Normal Saline       Head of Bed 30 Degrees       Euglycemia and Avoid Hyperthermia (PRN Acetaminophen)    ------------------------------------------------------------------------------  Advanced Imaging: Advanced Imaging Deferred because:  Non-disabling symptoms as verified by the patient; no cortical signs so not consistent with LVO   Metrics: Last Known Well: 06/30/2022 20:30:00 [facility] Notification Time: 07/01/2022 00:43:51 Arrival Time: 07/01/2022 00:05:00 Stamp Time: 07/01/2022 00:43:51 Initial Response Time: 07/01/2022 00:47:56 Symptoms: left sided numbness. Initial patient interaction: 07/01/2022 00:50:24 NIHSS Assessment Completed: 07/01/2022 00:58:55 Patient is not a candidate for Thrombolytic. Thrombolytic Medical Decision: 07/01/2022 00:58:56 Patient was not deemed candidate for Thrombolytic because of following reasons: Last Well Known Above 4.5 Hours. Coagulopathy. No disabling symptoms.  CT head showed no acute hemorrhage or acute core infarct.  Primary Provider Notified of Diagnostic Impression and Management Plan on: 07/01/2022 01:24:41    ------------------------------------------------------------------------------  History of Present  Illness: Patient is a 77 year old Male.  Patient was brought by private transportation with symptoms of left sided numbness. 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 He presents for left face and arm numbness. He said he had similar symptoms a few days ago but they resolved.  Symptoms returned between 2030-2100 last night (3/25). On exam, he has no clear numbness. He does have some right visual field deficit which is baseline from an old stroke.  He is on coumadin and plavix. INR is 2.7.   Past Medical History:      Hypertension      Hyperlipidemia      Coronary Artery Disease      Stroke [place] PMH:  PE  Medications:  Anticoagulant use:  Yes coumadin Antiplatelet use: Yes plavix Reviewed EMR for current medications  Allergies:  NKDA  Social History: Smoking: No  Family History:  There is no family history of premature cerebrovascular disease pertinent to this consultation  ROS : 14 Points Review of Systems was performed and was negative except mentioned in HPI.  Past Surgical History: There Is No Surgical History Contributory To Today's Visit    Examination: BP(187/87), Pulse(74), 1A: Level of Consciousness - Alert; keenly responsive + 0 1B: Ask Month and Age - Both Questions Right + 0 1C: Blink Eyes & Squeeze Hands - Performs Both Tasks + 0 2: Test Horizontal Extraocular Movements - Normal + 0 3: Test Visual Fields - Partial Hemianopia + 1 4: Test Facial Palsy (Use Grimace if Obtunded) - Normal symmetry + 0 5A: Test Left Arm Motor Drift - No Drift for 10 Seconds + 0 5B:  Test Right Arm Motor Drift - No Drift for 10 Seconds + 0 6A: Test Left Leg Motor Drift - No Drift for 5 Seconds + 0 6B: Test Right Leg Motor Drift - No Drift for 5 Seconds + 0 7: Test Limb Ataxia (FNF/Heel-Shin) - No Ataxia + 0 8: Test Sensation - Normal; No sensory loss + 0 9: Test Language/Aphasia - Normal; No aphasia + 0 10:  Test Dysarthria - Normal + 0 11: Test Extinction/Inattention - No abnormality + 0  NIHSS Score: 1   Pre-Morbid Modified Rankin Scale: 2 Points = Slight disability; unable to carry out all previous activities, but able to look after own affairs without assistance  Spoke with : Dr. Roxanne Mins  Patient/Family was informed the Neurology Consult would occur via TeleHealth consult by way of interactive audio and video telecommunications and consented to receiving care in this manner.   Patient is being evaluated for possible acute neurologic impairment and high probability of imminent or life-threatening deterioration. I spent total of 30 minutes providing care to this patient, including time for face to face visit via telemedicine, review of medical records, imaging studies and discussion of findings with providers, the patient and/or family.   Dr Precious Gilding   [facility] For Inpatient follow-up with [facility] physician please call [facility] [phone]. This is not an outpatient service. Post hospital discharge, please contact hospital directly.  Please do not communicate with [facility] physicians via secure chat. If you have any questions, Please contact [facility]. Please call or reconsult our service if there are any clinical or diagnostic changes.

## 2022-07-01 NOTE — ED Notes (Signed)
Urinal at bedside.  

## 2022-07-01 NOTE — Progress Notes (Signed)
ANTICOAGULATION CONSULT NOTE - Initial Consult  Pharmacy Consult for warfarin Indication: hx PE  Allergies  Allergen Reactions   Lisinopril Swelling and Other (See Comments)    Mouth and tongue swells    Patient Measurements: Height: 5\' 6"  (167.6 cm) Weight: 95.7 kg (210 lb 15.7 oz) IBW/kg (Calculated) : 63.8 Heparin Dosing Weight:   Vital Signs: Temp: 97.4 F (36.3 C) (03/26 0925) Temp Source: Oral (03/26 0719) BP: 149/75 (03/26 0925) Pulse Rate: 80 (03/26 0925)  Labs: Recent Labs    06/30/22 1108 07/01/22 0038  HGB 16.7 16.4  HCT 52.3* 51.2  PLT 176 160  APTT  --  45*  LABPROT  --  28.2*  INR  --  2.7*  CREATININE 1.45* 1.49*    Estimated Creatinine Clearance: 45.7 mL/min (A) (by C-G formula based on SCr of 1.49 mg/dL (H)).   Medical History: Past Medical History:  Diagnosis Date   Apical mural thrombus    Arteriosclerotic cardiovascular disease (ASCVD)    a. s/p CABG in 2011 b. DES to RCA in 08/2017 c. cath in 06/2018 showing patent LIMA-LAD, SVG-OM1-OM2 and SVG-D1   Chronic anticoagulation    Chronic systolic CHF (congestive heart failure) ([place])    a. EF 45-50% in 06/2012 b. 35-40% in 08/2017 with similar results in 06/2018 c. EF at 30-35% in 10/2020   CVA (cerebral vascular accident) ([place])    HTN (hypertension)    Hyperlipidemia    Keloid    median sternotomy   Pulmonary embolism ([place]) 03/2010   Substance abuse ([place])    formerly cocaine    Medications:  Medications Prior to Admission  Medication Sig Dispense Refill Last Dose   atorvastatin (LIPITOR) 40 MG tablet Take 1 tablet (40 mg total) by mouth daily at 6 PM. 30 tablet 0 06/30/2022   clopidogrel (PLAVIX) 75 MG tablet Take 75 mg by mouth daily.   06/30/2022 at am   furosemide (LASIX) 20 MG tablet TAKE 3 TABLETS BY MOUTH EVERY DAY 270 tablet 3 06/30/2022   hydrALAZINE (APRESOLINE) 50 MG tablet Take 1 tablet (50 mg total) by mouth 3 (three) times daily. 270 tablet 3 06/30/2022   JARDIANCE 10 MG  TABS tablet TAKE 1 TABLET BY MOUTH DAILY BEFORE BREAKFAST. 90 tablet 3 06/30/2022   metoprolol succinate (TOPROL-XL) 50 MG 24 hr tablet Take 50 mg by mouth daily.  3 06/30/2022   nitroGLYCERIN (NITROSTAT) 0.4 MG SL tablet Place 1 tablet (0.4 mg total) under the tongue every 5 (five) minutes as needed for chest pain. up to 3 doses. 25 tablet 4 unk   spironolactone (ALDACTONE) 25 MG tablet Take 1 tablet (25 mg total) by mouth daily. 90 tablet 3 06/30/2022   warfarin (COUMADIN) 5 MG tablet TAKE 1/2 TABLET TO 1 TABLET DAILY OR AS DIRECTED (Patient taking differently: Take 5 mg by mouth daily. Take 1/2 tablet to 1 tablet daily or as directed) 60 tablet 1 06/30/2022 at am   HYDROcodone-acetaminophen (NORCO) 5-325 MG tablet Take 1 tablet by mouth every 6 (six) hours as needed for severe pain. (Patient not taking: Reported on 07/01/2022) 10 tablet 0 Not Taking   isosorbide mononitrate (IMDUR) 60 MG 24 hr tablet Take 1 tablet (60 mg total) by mouth daily. 90 tablet 3     Assessment: Pharmacy consulted to dose warfarin in patient with history of PE and LV mural thrombus.  Home dose listed as 5 mg on Sunday and 2.5 mg ROW per anti-coag visit note on 3/20.  INR on  admission is therapeutic at 2.7 with last dose 3/25.    CBC WNL  Goal of Therapy:  INR 2-3 Monitor platelets by anticoagulation protocol: Yes   Plan:  Warfarin 2.5 mg x 1 dose. Monitor daily INR and s/s of bleeding.  Margot Ables, PharmD Clinical Pharmacist 07/01/2022 11:15 AM

## 2022-07-01 NOTE — ED Notes (Signed)
Patient transported to MRI 

## 2022-07-01 NOTE — Progress Notes (Signed)
Code stroke  Call time  1231am Beeper time  None Start   [place]   [place] called  [phone]

## 2022-07-01 NOTE — Procedures (Signed)
Patient Name: [name]  MRN: [id number]  Epilepsy Attending: Lora Havens  Referring Physician/Provider: Lora Havens, MD  Date: 07/01/2022 Duration: 23.33 mins  Patient history:  77 year old male with chronic strokes without residual deficits, prior apical thrombus as well as PE, on Coumadin who presented with left-sided paresthesias ongoing for 2 to 3 days. EEG to evaluate for seizure  Level of alertness: Awake, asleep  AEDs during EEG study: None  Technical aspects: This EEG study was done with scalp electrodes positioned according to the 10-20 International system of electrode placement. Electrical activity was reviewed with band pass filter of 1-70Hz , sensitivity of 7 uV/mm, display speed of 34mm/sec with a 60Hz  notched filter applied as appropriate. EEG data were recorded continuously and digitally stored.  Video monitoring was available and reviewed as appropriate.  Description: The posterior dominant rhythm consists of 8-9 Hz activity of moderate voltage (25-35 uV) seen predominantly in posterior head regions, symmetric and reactive to eye opening and eye closing. Sleep was characterized by vertex waves, sleep spindles (12 to 14 Hz), maximal frontocentral region. Physiologic photic driving was not seen during photic stimulation.  Hyperventilation was not performed.     IMPRESSION: This study is within normal limits. No seizures or epileptiform discharges were seen throughout the recording.  A normal interictal EEG does not exclude the diagnosis of epilepsy.  [name] [name]

## 2022-07-01 NOTE — H&P (Addendum)
History and Physical    Patient: [name] [id number] DOB: July 14, 1945 DOA: 07/01/2022 DOS: the patient was seen and examined on 07/01/2022 PCP: [name], MD  Patient coming from: Home  Chief Complaint:  Chief Complaint  Patient presents with   Numbness   HPI: [name] is a 77 y.o. 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  Patient complained of 2-day onset of intermittent left arm numbness which mostly occur at night. He presented with left-sided facial numbness yesterday in the evening which was a new presentation, so he presented to the ED for further evaluation and management.  He denies chest pain, shortness of breath, headache, fever, chills, speech difficulty.  ED Course:  In the emergency department, BP was 152/71, but other vital signs are within normal range.  Workup in the ED showed normal CBC and BMP except creatinine of 1.49 (creatinine is within baseline range), urinalysis was positive for glycosuria, but unimpressive for UTI.  Urine drug screen was normal.  INR 2.7. CT head without contrast showed no acute intracranial abnormality MRI head without contrast showed no acute intracranial process.  Chronic infarcts in the right cerebellum, left frontal lobe and left occipital lobe.  Degenerative changes in the upper cervical spine with mild to moderate spinal canal narrowing at C2-3 and C3-C4. Teleneurologist was consulted and recommended administration for stroke workup.  Hospitalist was asked to admit patient for further evaluation and management.  Review of Systems: Review of systems as noted in the HPI. All other systems reviewed and are negative.   Past Medical History:  Diagnosis Date   Apical mural thrombus    Arteriosclerotic cardiovascular disease  (ASCVD)    a. s/p CABG in 2011 b. DES to RCA in 08/2017 c. cath in 06/2018 showing patent LIMA-LAD, SVG-OM1-OM2 and SVG-D1   Chronic anticoagulation    Chronic systolic CHF (congestive heart failure) ([place])    a. EF 45-50% in 06/2012 b. 35-40% in 08/2017 with similar results in 06/2018 c. EF at 30-35% in 10/2020   CVA (cerebral vascular accident) ([place])    HTN (hypertension)    Hyperlipidemia    Keloid    median sternotomy   Pulmonary embolism ([place]) 03/2010   Substance abuse ([place])    formerly cocaine   Past Surgical History:  Procedure Laterality Date   COLONOSCOPY N/A 01/22/2017   Procedure: COLONOSCOPY;  Surgeon: Danie Binder, MD;  [facility];  Service: Endoscopy;  Laterality: N/A;  200   CORONARY ARTERY BYPASS GRAFT  03/18/2010   LIMA-LAD, SVG to diagonal, OM1 & OM2   CORONARY STENT INTERVENTION N/A 08/21/2017   Procedure: CORONARY STENT INTERVENTION;  Surgeon: [name], MD;  Location: [place] [facility];  Service: Cardiovascular;  Laterality: N/A;   LEFT HEART CATH AND CORS/GRAFTS ANGIOGRAPHY N/A 08/21/2017   Procedure: LEFT HEART CATH AND CORS/GRAFTS ANGIOGRAPHY;  Surgeon: [name], MD;  Location: [place] [facility];  Service: Cardiovascular;  Laterality: N/A;   LEFT HEART CATH AND CORS/GRAFTS ANGIOGRAPHY N/A 06/21/2018   Procedure: LEFT HEART CATH AND CORS/GRAFTS ANGIOGRAPHY;  Surgeon: Nelva Bush, MD;  Location: [place] [facility];  Service: Cardiovascular;  Laterality: N/A;   POLYPECTOMY  01/22/2017   Procedure: POLYPECTOMY;  Surgeon: Danie Binder, MD;  [facility];  Service: Endoscopy;;  Transverse(CS) and sigmoid  colon(HS)   PTCA  06/1996   LAD & RCA   TEE WITHOUT CARDIOVERSION N/A 06/10/2012   Procedure: TRANSESOPHAGEAL ECHOCARDIOGRAM (TEE);  Surgeon: Josue Hector, MD;  [facility];  Service: Cardiovascular;  Laterality: N/A;    Social History:  reports that he quit smoking about 20 years ago. His smoking use  included cigarettes. He has never used smokeless tobacco. He reports that he does not currently use alcohol. He reports that he does not use drugs.   Allergies  Allergen Reactions   Lisinopril Swelling and Other (See Comments)    Mouth and tongue swells    Family History  Problem Relation Age of Onset   Cancer Mother 53     Prior to Admission medications   Medication Sig Start Date End Date Taking? Authorizing Provider  atorvastatin (LIPITOR) 40 MG tablet Take 1 tablet (40 mg total) by mouth daily at 6 PM. 03/09/17   Debbe Odea, MD  clopidogrel (PLAVIX) 75 MG tablet Take 75 mg by mouth daily.    [provider]  clotrimazole-betamethasone (LOTRISONE) cream APPLY TO AFFECTED AREA TWICE A DAY 01/30/21   [provider]  cycloSPORINE (RESTASIS) 0.05 % ophthalmic emulsion Place 1 drop into both eyes daily as needed (dry eye).     [provider]  fluticasone (FLONASE) 50 MCG/ACT nasal spray See admin instructions. 10/09/20   [provider]  furosemide (LASIX) 20 MG tablet TAKE 3 TABLETS BY MOUTH EVERY DAY 10/02/20   Fay Records, MD  hydrALAZINE (APRESOLINE) 50 MG tablet Take 1 tablet (50 mg total) by mouth 3 (three) times daily. 10/15/21 10/10/22  Fay Records, MD  HYDROcodone-acetaminophen (NORCO) 5-325 MG tablet Take 1 tablet by mouth every 6 (six) hours as needed for severe pain. 11/06/19   Sherwood Gambler, MD  isosorbide mononitrate (IMDUR) 60 MG 24 hr tablet Take 1 tablet (60 mg total) by mouth daily. 09/12/20 06/29/22  Fay Records, MD  JARDIANCE 10 MG TABS tablet TAKE 1 TABLET BY MOUTH DAILY BEFORE BREAKFAST. 03/28/22   Fay Records, MD  metoprolol succinate (TOPROL-XL) 50 MG 24 hr tablet Take 50 mg by mouth daily. 04/27/17   [provider]  nitroGLYCERIN (NITROSTAT) 0.4 MG SL tablet Place 1 tablet (0.4 mg total) under the tongue every 5 (five) minutes as needed for chest pain. up to 3 doses. 07/05/13   Lendon Colonel, NP  spironolactone  (ALDACTONE) 25 MG tablet Take 1 tablet (25 mg total) by mouth daily. 06/28/21 06/23/22  Strader, Fransisco Hertz, PA-C  traMADol (ULTRAM) 50 MG tablet Take 50 mg by mouth 3 (three) times daily. 11/29/19   [provider]  warfarin (COUMADIN) 5 MG tablet TAKE 1/2 TABLET TO 1 TABLET DAILY OR AS DIRECTED 04/10/22   Fay Records, MD    Physical Exam: BP (!) 116/59   Pulse (!) 52   Temp 98.3 F (36.8 C) (Oral)   Resp 19   Ht 5\' 6"  (1.676 m)   Wt 97 kg   SpO2 95%   BMI 34.52 kg/m   General: 77 y.o. year-old male well developed well nourished in no acute distress.  Alert and oriented x3. HEENT: NCAT, EOMI Neck: Supple, trachea medial Cardiovascular: Regular rate and rhythm with no rubs or gallops.  No thyromegaly or JVD noted.  No lower extremity edema. 2/4 pulses in all 4 extremities. Respiratory: Clear to auscultation with no wheezes or rales. Good inspiratory effort. Abdomen: Soft, nontender nondistended with normal  bowel sounds x4 quadrants. Muskuloskeletal: No cyanosis, clubbing or edema noted bilaterally Neuro: CN II-XII intact, patient was hard of hearing.  Strength 5/5 x 4, sensation, reflexes intact Skin: No ulcerative lesions noted or rashes Psychiatry: Mood is appropriate for condition and setting          Labs on Admission:  Basic Metabolic Panel: Recent Labs  Lab 06/30/22 1108 07/01/22 0038  NA 135 135  K 4.3 4.0  CL 101 103  CO2 25 25  GLUCOSE 75 99  BUN 15 18  CREATININE 1.45* 1.49*  CALCIUM 8.8* 8.6*   Liver Function Tests: Recent Labs  Lab 06/30/22 1108 07/01/22 0038  AST 38 40  ALT 61* 56*  ALKPHOS 59 69  BILITOT 1.8* 1.2  PROT 7.8 7.4  ALBUMIN 4.3 4.0   No results for input(s): "LIPASE", "AMYLASE" in the last 168 hours. No results for input(s): "AMMONIA" in the last 168 hours. CBC: Recent Labs  Lab 06/30/22 1108 07/01/22 0038  WBC 5.2 6.9  NEUTROABS 2.0 2.3  HGB 16.7 16.4  HCT 52.3* 51.2  MCV 90.8 91.8  PLT 176 160   Cardiac  Enzymes: No results for input(s): "CKTOTAL", "CKMB", "CKMBINDEX", "TROPONINI" in the last 168 hours.  BNP (last 3 results) Recent Labs    01/10/22 1105  BNP 94.0    ProBNP (last 3 results) No results for input(s): "PROBNP" in the last 8760 hours.  CBG: Recent Labs  Lab 07/01/22 0048  GLUCAP 94    Radiological Exams on Admission: MR BRAIN WO CONTRAST  Result Date: 07/01/2022 CLINICAL DATA:  Stroke suspected EXAM: MRI HEAD WITHOUT CONTRAST TECHNIQUE: Multiplanar, multiecho pulse sequences of the brain and surrounding structures were obtained without intravenous contrast. COMPARISON:  Same day CT head FINDINGS: Brain: Chronic right cerebellar, left frontal, and left occipital lobe infarcts. Negative for an acute infarct. No hydrocephalus. No extra axial fluid collection. Sequela of mild overall chronic microvascular ischemic change with age appropriate generalized volume loss. No hemorrhage. Vascular: Normal flow voids. Skull and upper cervical spine: Normal marrow signal. Sinuses/Orbits: Negative. Other: Degenerative changes in the upper cervical spine with mild to moderate spinal canal narrowing at C2-C3 and C3-C4. IMPRESSION: 1. No acute intracranial process. 2. Chronic infarcts in the right cerebellum, left frontal lobe, and left occipital lobe. 3. Degenerative changes in the upper cervical spine with mild to moderate spinal canal narrowing at C2-C3 and C3-C4. Electronically Signed   By: Marin Roberts M.D.   On: 07/01/2022 08:26   CT HEAD CODE STROKE WO CONTRAST  Result Date: 07/01/2022 CLINICAL DATA:  Code stroke.  Left facial numbness EXAM: CT HEAD WITHOUT CONTRAST TECHNIQUE: Contiguous axial images were obtained from the base of the skull through the vertex without intravenous contrast. RADIATION DOSE REDUCTION: This exam was performed according to the departmental dose-optimization program which includes automated exposure control, adjustment of the mA and/or kV according to patient  size and/or use of iterative reconstruction technique. COMPARISON:  09/16/2020 FINDINGS: Brain: There is no mass, hemorrhage or extra-axial collection. The size and configuration of the ventricles and extra-axial CSF spaces are normal. There are old left frontal, left occipital and right cerebellar infarcts Vascular: No abnormal hyperdensity of the major intracranial arteries or dural venous sinuses. No intracranial atherosclerosis. Skull: The visualized skull base, calvarium and extracranial soft tissues are normal. Sinuses/Orbits: No fluid levels or advanced mucosal thickening of the visualized paranasal sinuses. No mastoid or middle ear effusion. The orbits are normal. ASPECTS [facility] Stroke Program Early CT  Score) - Ganglionic level infarction (caudate, lentiform nuclei, internal capsule, insula, M1-M3 cortex): 7 - Supraganglionic infarction (M4-M6 cortex): 3 Total score (0-10 with 10 being normal): 10 IMPRESSION: 1. No acute intracranial abnormality. 2. Old left frontal, left occipital and right cerebellar infarcts. 3. ASPECTS is 10. These results were called by telephone at the time of interpretation on 07/01/2022 at 12:47 am to provider DAVID [facility] , who verbally acknowledged these results. Electronically Signed   By: Ulyses Jarred M.D.   On: 07/01/2022 00:49    EKG: I independently viewed the EKG done and my findings are as followed: Normal sinus rhythm at a rate of 74 bpm with multiple PVCs  Assessment/Plan Present on Admission:  Left arm numbness  Chronic kidney disease, stage 3a (HCC)  Chronic HFrEF (heart failure with reduced ejection fraction) ([place])  Essential hypertension  Pulmonary embolism ([place])  Mixed hyperlipidemia  Principal Problem:   Left arm numbness Active Problems:   Mixed hyperlipidemia   Essential hypertension   Pulmonary embolism (HCC)   Chronic kidney disease, stage 3a (HCC)   Chronic HFrEF (heart failure with reduced ejection fraction) (HCC)   Sensory  disturbance  Left arm numbness rule out acute ischemic stroke Patient will be admitted to telemetry unit  CT head without contrast showed no acute intracranial pathology Echocardiogram in the morning MRI head without contrast showed no acute intracranial process. MRA of brain in the morning Continue Plavix and statin Continue fall precautions and neuro checks Lipid panel and hemoglobin A1c will be checked Continue PT/OT eval and treat Bedside swallow eval by nursing prior to diet Tele neurology will be consulted for further recommendation  Chronic HFrEF Patient appears euvolemic Echocardiogram done on 08/28/2021 showed LVEF of 35 to 40%, positive regional wall motion abnormalities. Continue total input/output, daily weights and fluid restriction Continue heart healthy diet   Essential hypertension (controlled)  History of pulmonary embolus History of LV thrombus INR 2.7, continue warfarin  CAD Mixed hyperlipidemia Continue Lipitor  CKD stage IIIA Creatinine 1.49 (creatinine is within baseline range) Renally adjust medications, avoid nephrotoxic agents/dehydration/hypotension   DVT prophylaxis: Warfarin  Code Status: Full code  Consults: Teleneurology  Family Communication: Side  Severity of Illness: The appropriate patient status for this patient is OBSERVATION. Observation status is judged to be reasonable and necessary in order to provide the required intensity of service to ensure the patient's safety. The patient's presenting symptoms, physical exam findings, and initial radiographic and laboratory data in the context of their medical condition is felt to place them at decreased risk for further clinical deterioration. Furthermore, it is anticipated that the patient will be medically stable for discharge from the hospital within 2 midnights of admission.   Author: Bernadette Hoit, DO 07/01/2022 9:16 AM  For on call review [email or web address].

## 2022-07-01 NOTE — Progress Notes (Signed)
  Echocardiogram 2D Echocardiogram has been performed.  TARRY DELVALLE 07/01/2022, 3:19 PM

## 2022-07-01 NOTE — Progress Notes (Addendum)
0030: Tele stroke cart activated at this time. Pt [name] 2100. Pt takes coumadin. Last dose was 06/30/22 in the evening.  0036: Pt to CT  0043: TSP paged at this time.  0047: Pt returned from [place]: TSP on camera at this time to assess patient.  [id number]: Results of NCCT provided to TSP.  0059: TSP and TSRN off camera at this time. TSP to call EDP with plan of care.

## 2022-07-01 NOTE — Progress Notes (Signed)
Patient discharged home with medication regimen and follow up appointments, transported via w/c to daughters personal vehicle. IV removed from LFA .

## 2022-07-01 NOTE — TOC Progression Note (Signed)
  Transition of Care (TOC) Screening Note   Patient Details  Name: [name] Date of Birth: 1945/07/29   Transition of Care [facility]) CM/SW Contact:    Boneta Lucks, RN Phone Number: 07/01/2022, 1:22 PM  PT eval pending.   Transition of Care Department [facility]) has reviewed patient and no TOC needs have been identified at this time. We will continue to monitor patient advancement through interdisciplinary progression rounds. If new patient transition needs arise, please place a TOC consult.      Barriers to Discharge: Continued Medical Work up  Expected Discharge Plan and Services       Living arrangements for the past 2 months: Apartment                     Social Determinants of Health (SDOH) Interventions SDOH Screenings   Food Insecurity: No Food Insecurity (07/01/2022)  Housing: Low Risk  (07/01/2022)  Transportation Needs: No Transportation Needs (07/01/2022)  Utilities: Not At Risk (07/01/2022)  Alcohol Screen: Low Risk  (10/30/2020)  Depression (PHQ2-9): Low Risk  (10/30/2020)  Financial Resource Strain: Low Risk  (10/30/2020)  Physical Activity: Insufficiently Active (10/30/2020)  Social Connections: Moderately Isolated (10/30/2020)  Stress: No Stress Concern Present (10/30/2020)  Tobacco Use: Medium Risk (07/01/2022)    Readmission Risk Interventions     No data to display

## 2022-07-01 NOTE — Progress Notes (Signed)
EEG complete - results pending 

## 2022-07-01 NOTE — ED Notes (Signed)
ED Provider at bedside. 

## 2022-07-01 NOTE — Discharge Summary (Signed)
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  The patient is a difficult historian.  However he states that he had numbness in his left arm about 2 days prior to this admission.  He felt this has been constant.  He subsequently developed facial numbness on the evening of 06/30/2022.  As result, he came to the emergency department for further evaluation.  He denies any headache, word finding difficulty, slurred speech, headache.  He has not had any neck pain or back pain.  He denies any fevers, chills, chest pain, shortness breath, cough, hemoptysis, nausea, vomiting, direct abdominal pain. In the ED, the patient was afebrile and hemodynamically stable with oxygen saturation 98% on room air.  WBC 6.9, hemoglobin 16.4, platelets 160,000.  Sodium 135, potassium 4.0, bicarbonate 25, serum creatinine 1.49.  INR 2.7 CT of the brain was negative for any acute abnormalities but showed an old left frontal, left occipital, and right cerebellar infarcts.  He was seen by teleneurology who recommended further evaluation and treatment.  The patient was admitted for further evaluation and treatment.    Assessment and Plan: Sensory disturbance -felt to be TIA -Symptoms are improving, but not completely resolved --Appreciate Neurology Consult -PT/OT evaluation -Speech therapy eval -CT brain--neg -MRI brain--neg acute stroke -MRA brain--neg -EEG no  seizure -Echo--EF 35-40%, G1DD; Nonmobile apical LV mural thrombus visualized.  -LDL--1 -HbA1C--pending at time of dc -Antiplatelet--warfarin + plavix 75 mg daily -referral back to hematology 07/07/22 at 9 AM--may need hypercoag workup as he has TIA/new LV thrombus with therapeutic INR 2.7   Chronic HFpEF -Patient is clinically euvolemic -Continue home doses of hydralazine, Imdur, Jardiance, -Holding spironolactone, metoprolol succinate to allow for permissive hypertension -08/28/2021 echo EF 35-40%, no LV thrombus, normal RV function, +WMA  LV thrombus -has re-curred -discussed with cardiology, Dr. Domenic Polite -no good options--continue warfarin and plavix   CKD stage IIIa -Baseline creatinine 1.3-1.5   Mixed hyperlipidemia -Continue statin   Coronary artery disease -restart Plavix   History of pulmonary embolus -Continue warfarin (Pt had PE while on apixaban 10/2020, so switched to warfarin at that time) -Pharmacy assisting in dosing   Essential hypertension -BP is controlled   History of LV mural thrombus -Remains on warfarin      Consultants: neurology Procedures performed: none  Disposition: Home Diet recommendation:  Cardiac diet DISCHARGE MEDICATION: Allergies as of 07/01/2022       Reactions   Lisinopril Swelling, Other (See Comments)   Mouth and tongue swells        Medication List     STOP taking these medications    HYDROcodone-acetaminophen 5-325 MG tablet Commonly known as: Norco       TAKE these medications    atorvastatin 40 MG tablet Commonly known as: LIPITOR Take 1 tablet (40 mg total) by mouth daily at 6 PM.   clopidogrel 75 MG tablet Commonly known as: PLAVIX Take 75 mg by mouth daily.  furosemide 20 MG tablet Commonly known as: LASIX TAKE 3 TABLETS BY MOUTH EVERY DAY   hydrALAZINE 50 MG tablet Commonly known as: APRESOLINE Take 1 tablet (50 mg total) by mouth 3 (three) times daily.   isosorbide mononitrate 60 MG 24 hr  tablet Commonly known as: IMDUR Take 1 tablet (60 mg total) by mouth daily.   Jardiance 10 MG Tabs tablet Generic drug: empagliflozin TAKE 1 TABLET BY MOUTH DAILY BEFORE BREAKFAST.   metoprolol succinate 50 MG 24 hr tablet Commonly known as: TOPROL-XL Take 50 mg by mouth daily.   nitroGLYCERIN 0.4 MG SL tablet Commonly known as: NITROSTAT Place 1 tablet (0.4 mg total) under the tongue every 5 (five) minutes as needed for chest pain. up to 3 doses.   spironolactone 25 MG tablet Commonly known as: ALDACTONE Take 1 tablet (25 mg total) by mouth daily.   warfarin 5 MG tablet Commonly known as: COUMADIN Take as directed. If you are unsure how to take this medication, talk to your nurse or doctor. Original instructions: TAKE 1/2 TABLET TO 1 TABLET DAILY OR AS DIRECTED What changed: See the new instructions.        Discharge Exam: Filed Weights   July 18, 2022 0016 07/18/2022 0925  Weight: 97 kg 95.7 kg   HEENT:  [place]/AT, No thrush, no icterus CV:  RRR, no rub, no S3, no S4 Lung:  CTA, no wheeze, no rhonchi Abd:  soft/+BS, NT Ext:  No edema, no lymphangitis, no synovitis, no rash   Condition at discharge: stable  The results of significant diagnostics from this hospitalization (including imaging, microbiology, ancillary and laboratory) are listed below for reference.   Imaging Studies: EEG adult  Result Date: 07/18/22 Lora Havens, MD     18-Jul-2022  5:23 PM Patient Name: [name] MRN: [id number] Epilepsy Attending: Lora Havens Referring Physician/Provider: Lora Havens, MD Date: 18-Jul-2022 Duration: 23.33 mins Patient history:  77 year old male with chronic strokes without residual deficits, prior apical thrombus as well as PE, on Coumadin who presented with left-sided paresthesias ongoing for 2 to 3 days. EEG to evaluate for seizure Level of alertness: Awake, asleep AEDs during EEG study: None Technical aspects: This EEG study was done with scalp electrodes  positioned according to the 10-20 International system of electrode placement. Electrical activity was reviewed with band pass filter of 1-70Hz , sensitivity of 7 uV/mm, display speed of 27mm/sec with a 60Hz  notched filter applied as appropriate. EEG data were recorded continuously and digitally stored.  Video monitoring was available and reviewed as appropriate. Description: The posterior dominant rhythm consists of 8-9 Hz activity of moderate voltage (25-35 uV) seen predominantly in posterior head regions, symmetric and reactive to eye opening and eye closing. Sleep was characterized by vertex waves, sleep spindles (12 to 14 Hz), maximal frontocentral region. Physiologic photic driving was not seen during photic stimulation.  Hyperventilation was not performed.   IMPRESSION: This study is within normal limits. No seizures or epileptiform discharges were seen throughout the recording. A normal interictal EEG does not exclude the diagnosis of epilepsy. Lora Havens   ECHOCARDIOGRAM COMPLETE  Result Date: 07-18-22    ECHOCARDIOGRAM REPORT   Patient Name:   [name] Date of Exam: 2022/07/18 Medical Rec #:  [id number]       Height:       66.0 in Accession #:    [id number]      Weight:       211.0 lb Date of Birth:  11/17/1945       BSA:          2.046 m Patient Age:    62 years        BP:           133/77 mmHg Patient Gender: M               HR:           66 bpm. Exam Location:  [facility] Procedure: 2D Echo, Cardiac Doppler, Color Doppler and Intracardiac            Opacification Agent Indications:    Stroke I63.9  History:        Patient has prior history of Echocardiogram examinations, most                 recent 08/28/2021. Apical thrombus, Prior CABG, Stroke, Mitral                 Stenosis; Risk Factors:Dyslipidemia, Sleep Apnea, Former Smoker                 and Hypertension.  Sonographer:    Greer Pickerel Referring Phys: [id number] OLADAPO ADEFESO  Sonographer Comments: Technically difficult study due  to poor echo windows. Image acquisition challenging due to patient body habitus and Image acquisition challenging due to respiratory motion. IMPRESSIONS  1. Left ventricular ejection fraction, by estimation, is 35 to 40%. The left ventricle has moderately decreased function. The left ventricle demonstrates regional wall motion abnormalities (see scoring diagram/findings for description). Left ventricular  diastolic parameters are consistent with Grade I diastolic dysfunction (impaired relaxation).  2. Nonmobile apical LV mural thrombus noted with Definity contrast, approximately 1 x 0.5 cm.  3. Right ventricular systolic function is normal. The right ventricular size is normal. Tricuspid regurgitation signal is inadequate for assessing PA pressure.  4. The mitral valve is grossly normal. Trivial mitral valve regurgitation.  5. The aortic valve was not well visualized. Aortic valve regurgitation is not visualized.  6. The inferior vena cava is normal in size with <50% respiratory variability, suggesting right atrial pressure of 8 mmHg. Comparison(s): Prior images reviewed side by side. LVEF stable range at 35-40%. Nonmobile apical LV mural thrombus visualized. FINDINGS  Left Ventricle: Left ventricular ejection fraction, by estimation, is 35 to 40%. The left ventricle has moderately decreased function. The left ventricle demonstrates regional wall motion abnormalities. Definity contrast agent was given IV to delineate the left ventricular endocardial borders. The left ventricular internal cavity size was normal in size. There is borderline left ventricular hypertrophy. Left ventricular diastolic parameters are consistent with Grade I diastolic dysfunction (impaired relaxation).  LV Wall Scoring: The mid and distal anterior septum, apical lateral segment, apical inferior segment, and apex are akinetic. The entire anterior wall, antero-lateral wall, inferior wall, posterior wall, basal anteroseptal segment, mid  inferoseptal segment, and basal inferoseptal segment are normal. Right Ventricle: The right ventricular size is normal. No increase in right ventricular wall thickness. Right ventricular systolic function is normal. Tricuspid regurgitation signal is inadequate for assessing PA pressure. The tricuspid regurgitant velocity is 0.78 m/s, and with an assumed right atrial pressure of 8 mmHg, the estimated right ventricular systolic pressure is [id number] mmHg. Left Atrium: Left atrial size was normal in size. Right Atrium: Right atrial size was normal in size. Pericardium: There is no evidence of pericardial effusion. Mitral Valve: The mitral valve is grossly normal. Trivial mitral valve regurgitation. Tricuspid Valve: The tricuspid valve is grossly normal. Tricuspid  valve regurgitation is trivial. Aortic Valve: The aortic valve was not well visualized. There is mild aortic valve annular calcification. Aortic valve regurgitation is not visualized. Pulmonic Valve: The pulmonic valve was grossly normal. Pulmonic valve regurgitation is trivial. Aorta: The aortic root is normal in size and structure. Venous: The inferior vena cava is normal in size with less than 50% respiratory variability, suggesting right atrial pressure of 8 mmHg. IAS/Shunts: No atrial level shunt detected by color flow Doppler.  LEFT VENTRICLE PLAX 2D LVIDd:         4.80 cm   Diastology LVIDs:         3.90 cm   LV e' medial:    5.33 cm/s LV PW:         1.10 cm   LV E/e' medial:  8.9 LV IVS:        0.90 cm   LV e' lateral:   3.81 cm/s LVOT diam:     1.90 cm   LV E/e' lateral: 12.4 LV SV:         48 LV SV Index:   23 LVOT Area:     2.84 cm  RIGHT VENTRICLE RV S prime:     5.87 cm/s LEFT ATRIUM             Index        RIGHT ATRIUM           Index LA diam:        4.10 cm 2.00 cm/m   RA Area:     19.50 cm LA Vol (A2C):   51.5 ml 25.17 ml/m  RA Volume:   55.80 ml  27.28 ml/m LA Vol (A4C):   44.8 ml 21.90 ml/m LA Biplane Vol: 51.3 ml 25.08 ml/m  AORTIC VALVE  LVOT Vmax:   89.80 cm/s LVOT Vmean:  48.300 cm/s LVOT VTI:    0.169 m  AORTA Ao Root diam: 3.90 cm MITRAL VALVE               TRICUSPID VALVE MV Area (PHT): 2.34 cm    TR Peak grad:   2.4 mmHg MV Decel Time: 324 msec    TR Vmax:        77.60 cm/s MV E velocity: 47.20 cm/s MV A velocity: 87.10 cm/s  SHUNTS MV E/A ratio:  0.54        Systemic VTI:  0.17 m                            Systemic Diam: 1.90 cm Rozann Lesches MD Electronically signed by Rozann Lesches MD Signature Date/Time: 07/01/2022/3:34:12 PM    Final    MR ANGIO HEAD WO CONTRAST  Result Date: 07/01/2022 CLINICAL DATA:  Neuro deficit, acute, stroke suspected. EXAM: MRA HEAD WITHOUT CONTRAST TECHNIQUE: Angiographic images of the Circle of Willis were acquired using MRA technique without intravenous contrast. COMPARISON:  MRI earlier same day.  MRA 06/07/2012 FINDINGS: Both internal carotid arteries are widely patent into the brain. No siphon stenosis. The anterior and middle cerebral vessels are patent without proximal stenosis, aneurysm or vascular malformation. Both vertebral arteries are widely patent to the basilar. No basilar stenosis. Posterior circulation branch vessels appear normal. IMPRESSION: Normal intracranial MR angiography of the large and medium size vessels. Electronically Signed   By: Nelson Chimes M.D.   On: 07/01/2022 09:38   MR BRAIN WO CONTRAST  Result Date: 07/01/2022 CLINICAL DATA:  Stroke suspected  EXAM: MRI HEAD WITHOUT CONTRAST TECHNIQUE: Multiplanar, multiecho pulse sequences of the brain and surrounding structures were obtained without intravenous contrast. COMPARISON:  Same day CT head FINDINGS: Brain: Chronic right cerebellar, left frontal, and left occipital lobe infarcts. Negative for an acute infarct. No hydrocephalus. No extra axial fluid collection. Sequela of mild overall chronic microvascular ischemic change with age appropriate generalized volume loss. No hemorrhage. Vascular: Normal flow voids. Skull and  upper cervical spine: Normal marrow signal. Sinuses/Orbits: Negative. Other: Degenerative changes in the upper cervical spine with mild to moderate spinal canal narrowing at C2-C3 and C3-C4. IMPRESSION: 1. No acute intracranial process. 2. Chronic infarcts in the right cerebellum, left frontal lobe, and left occipital lobe. 3. Degenerative changes in the upper cervical spine with mild to moderate spinal canal narrowing at C2-C3 and C3-C4. Electronically Signed   By: Marin Roberts M.D.   On: 07/01/2022 08:26   CT HEAD CODE STROKE WO CONTRAST  Result Date: 07/01/2022 CLINICAL DATA:  Code stroke.  Left facial numbness EXAM: CT HEAD WITHOUT CONTRAST TECHNIQUE: Contiguous axial images were obtained from the base of the skull through the vertex without intravenous contrast. RADIATION DOSE REDUCTION: This exam was performed according to the departmental dose-optimization program which includes automated exposure control, adjustment of the mA and/or kV according to patient size and/or use of iterative reconstruction technique. COMPARISON:  09/16/2020 FINDINGS: Brain: There is no mass, hemorrhage or extra-axial collection. The size and configuration of the ventricles and extra-axial CSF spaces are normal. There are old left frontal, left occipital and right cerebellar infarcts Vascular: No abnormal hyperdensity of the major intracranial arteries or dural venous sinuses. No intracranial atherosclerosis. Skull: The visualized skull base, calvarium and extracranial soft tissues are normal. Sinuses/Orbits: No fluid levels or advanced mucosal thickening of the visualized paranasal sinuses. No mastoid or middle ear effusion. The orbits are normal. ASPECTS [facility] Stroke Program Early CT Score) - Ganglionic level infarction (caudate, lentiform nuclei, internal capsule, insula, M1-M3 cortex): 7 - Supraganglionic infarction (M4-M6 cortex): 3 Total score (0-10 with 10 being normal): 10 IMPRESSION: 1. No acute intracranial  abnormality. 2. Old left frontal, left occipital and right cerebellar infarcts. 3. ASPECTS is 10. These results were called by telephone at the time of interpretation on 07/01/2022 at 12:47 am to provider Majesty Oehlert [facility] , who verbally acknowledged these results. Electronically Signed   By: Ulyses Jarred M.D.   On: 07/01/2022 00:49    Microbiology: Results for orders placed or performed during the hospital encounter of 10/18/20  Resp Panel by RT-PCR (Flu A&B, Covid) Nasopharyngeal Swab     Status: None   Collection Time: 10/18/20  4:26 AM   Specimen: Nasopharyngeal Swab; Nasopharyngeal(NP) swabs in vial transport medium  Result Value Ref Range Status   SARS Coronavirus 2 by RT PCR NEGATIVE NEGATIVE Final    Comment: (NOTE) SARS-CoV-2 target nucleic acids are NOT DETECTED.  The SARS-CoV-2 RNA is generally detectable in upper respiratory specimens during the acute phase of infection. The lowest concentration of SARS-CoV-2 viral copies this assay can detect is 138 copies/mL. A negative result does not preclude SARS-Cov-2 infection and should not be used as the sole basis for treatment or other patient management decisions. A negative result may occur with  improper specimen collection/handling, submission of specimen other than nasopharyngeal swab, presence of viral mutation(s) within the areas targeted by this assay, and inadequate number of viral copies(<138 copies/mL). A negative result must be combined with clinical observations, patient history, and epidemiological information. The  expected result is Negative.  Fact Sheet for Patients:  EntrepreneurPulse.com.au  Fact Sheet for Healthcare Providers:  IncredibleEmployment.be  This test is no t yet approved or cleared by the [place] FDA and  has been authorized for detection and/or diagnosis of SARS-CoV-2 by FDA under an Emergency Use Authorization (EUA). This EUA will remain  in effect (meaning  this test can be used) for the duration of the COVID-19 declaration under Section 564(b)(1) of the Act, 21 U.S.C.section 360bbb-3(b)(1), unless the authorization is terminated  or revoked sooner.       Influenza A by PCR NEGATIVE NEGATIVE Final   Influenza B by PCR NEGATIVE NEGATIVE Final    Comment: (NOTE) The Xpert Xpress SARS-CoV-2/FLU/RSV plus assay is intended as an aid in the diagnosis of influenza from Nasopharyngeal swab specimens and should not be used as a sole basis for treatment. Nasal washings and aspirates are unacceptable for Xpert Xpress SARS-CoV-2/FLU/RSV testing.  Fact Sheet for Patients: EntrepreneurPulse.com.au  Fact Sheet for Healthcare Providers: IncredibleEmployment.be  This test is not yet approved or cleared by the [place] FDA and has been authorized for detection and/or diagnosis of SARS-CoV-2 by FDA under an Emergency Use Authorization (EUA). This EUA will remain in effect (meaning this test can be used) for the duration of the COVID-19 declaration under Section 564(b)(1) of the Act, 21 U.S.C. section 360bbb-3(b)(1), unless the authorization is terminated or revoked.  Performed at [facility], [address] [place], [place] [address]     Labs: CBC: Recent Labs  Lab 06/30/22 1108 07/01/22 0038  WBC 5.2 6.9  NEUTROABS 2.0 2.3  HGB 16.7 16.4  HCT 52.3* 51.2  MCV 90.8 91.8  PLT 176 0000000   Basic Metabolic Panel: Recent Labs  Lab 06/30/22 1108 07/01/22 0038 07/01/22 0912  NA 135 135  --   K 4.3 4.0  --   CL 101 103  --   CO2 25 25  --   GLUCOSE 75 99  --   BUN 15 18  --   CREATININE 1.45* 1.49*  --   CALCIUM 8.8* 8.6*  --   MG  --   --  2.2  PHOS  --   --  3.9   Liver Function Tests: Recent Labs  Lab 06/30/22 1108 07/01/22 0038  AST 38 40  ALT 61* 56*  ALKPHOS 59 69  BILITOT 1.8* 1.2  PROT 7.8 7.4  ALBUMIN 4.3 4.0   CBG: Recent Labs  Lab 07/01/22 0048  GLUCAP 94     Discharge time spent: greater than 30 minutes.  Signed: Orson Eva, MD [facility] 07/01/2022

## 2022-07-01 NOTE — ED Notes (Signed)
Pt back from MRI 

## 2022-07-01 NOTE — ED Triage Notes (Addendum)
Pt with c/o L sided facial numbness and L arm numbness that "started a few nights ago". States it comes and goes and is mainly at night but that "tonight is different in that it is affecting his face now". States he was seen by his MD today and was told "everything looked good".

## 2022-07-01 NOTE — Plan of Care (Signed)

## 2022-07-01 NOTE — Hospital Course (Addendum)
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  The patient is a difficult historian.  However he states that he had numbness in his left arm about 2 days prior to this admission.  He felt this has been constant.  He subsequently developed facial numbness on the evening of 06/30/2022.  As result, he came to the emergency department for further evaluation.  He denies any headache, word finding difficulty, slurred speech, headache.  He has not had any neck pain or back pain.  He denies any fevers, chills, chest pain, shortness breath, cough, hemoptysis, nausea, vomiting, direct abdominal pain. In the ED, the patient was afebrile and hemodynamically stable with oxygen saturation 98% on room air.  WBC 6.9, hemoglobin 16.4, platelets 160,000.  Sodium 135, potassium 4.0, bicarbonate 25, serum creatinine 1.49.  INR 2.7 CT of the brain was negative for any acute abnormalities but showed an old left frontal, left occipital, and right cerebellar infarcts.  He was seen by teleneurology who recommended further evaluation and treatment.  The patient was admitted for further evaluation and treatment.

## 2022-07-01 NOTE — ED Provider Notes (Signed)
[facility] Provider Note   CSN: [id number] Arrival date & time: 07/01/22  0005     History  Chief Complaint  Patient presents with   Numbness    [name] is a 77 y.o. male.  The history is provided by the patient. The history is limited by the condition of the patient (He is very hard of hearing, but also a very difficult historian.).  He has history of hypertension, hyperlipidemia, pulmonary embolism anticoagulated on warfarin, stroke, systolic heart failure and comes in because of numbness.  For the last 2 days, he has had intermittent numbness of his left arm.  Tonight, he had numbness of his arm but also the left side of his face.  This started at 9:30 PM.  He denies any weakness and denies any vision changes.  He denies any headache.   Home Medications Prior to Admission medications   Medication Sig Start Date End Date Taking? Authorizing Provider  atorvastatin (LIPITOR) 40 MG tablet Take 1 tablet (40 mg total) by mouth daily at 6 PM. 03/09/17   Debbe Odea, MD  clopidogrel (PLAVIX) 75 MG tablet Take 75 mg by mouth daily.    [provider]  clotrimazole-betamethasone (LOTRISONE) cream APPLY TO AFFECTED AREA TWICE A DAY 01/30/21   [provider]  cycloSPORINE (RESTASIS) 0.05 % ophthalmic emulsion Place 1 drop into both eyes daily as needed (dry eye).     [provider]  fluticasone (FLONASE) 50 MCG/ACT nasal spray See admin instructions. 10/09/20   [provider]  furosemide (LASIX) 20 MG tablet TAKE 3 TABLETS BY MOUTH EVERY DAY 10/02/20   Fay Records, MD  hydrALAZINE (APRESOLINE) 50 MG tablet Take 1 tablet (50 mg total) by mouth 3 (three) times daily. 10/15/21 10/10/22  Fay Records, MD  HYDROcodone-acetaminophen (NORCO) 5-325 MG tablet Take 1 tablet by mouth every 6 (six) hours as needed for severe pain. 11/06/19   Sherwood Gambler, MD  isosorbide mononitrate (IMDUR) 60 MG 24 hr tablet Take 1  tablet (60 mg total) by mouth daily. 09/12/20 06/29/22  Fay Records, MD  JARDIANCE 10 MG TABS tablet TAKE 1 TABLET BY MOUTH DAILY BEFORE BREAKFAST. 03/28/22   Fay Records, MD  metoprolol succinate (TOPROL-XL) 50 MG 24 hr tablet Take 50 mg by mouth daily. 04/27/17   [provider]  nitroGLYCERIN (NITROSTAT) 0.4 MG SL tablet Place 1 tablet (0.4 mg total) under the tongue every 5 (five) minutes as needed for chest pain. up to 3 doses. 07/05/13   Lendon Colonel, NP  spironolactone (ALDACTONE) 25 MG tablet Take 1 tablet (25 mg total) by mouth daily. 06/28/21 06/23/22  Strader, Fransisco Hertz, PA-C  traMADol (ULTRAM) 50 MG tablet Take 50 mg by mouth 3 (three) times daily. 11/29/19   [provider]  warfarin (COUMADIN) 5 MG tablet TAKE 1/2 TABLET TO 1 TABLET DAILY OR AS DIRECTED 04/10/22   Fay Records, MD      Allergies    Lisinopril    Review of Systems   Review of Systems  All other systems reviewed and are negative.   Physical Exam Updated Vital Signs BP (!) 187/87 (BP Location: Right Arm)   Pulse 74   Temp 97.8 F (36.6 C) (Oral)   Resp 13   Ht 5\' 6"  (1.676 m)   Wt 97 kg   SpO2 98%   BMI 34.52 kg/m  Physical Exam Vitals and nursing note reviewed.  77 year old male, resting comfortably and in no acute distress. Vital signs are significant for elevated blood pressure. Oxygen saturation is 98%, which is normal. Head is normocephalic and atraumatic. PERRLA, EOMI. Oropharynx is clear. Neck is nontender and supple without adenopathy or JVD.  There are no carotid bruits. Back is nontender and there is no CVA tenderness. Lungs are clear without rales, wheezes, or rhonchi. Chest is nontender. Heart has regular rate and rhythm without murmur. Abdomen is soft, flat, nontender. Extremities have no cyanosis or edema, full range of motion is present. Skin is warm and dry without rash. Neurologic: Awake and alert but very hard of hearing.  Cranial nerves are intact.  There  is no facial asymmetry, tongue protrudes in the midline, sensation is symmetric in all 3 divisions of the 5th cranial nerve.  Strength is 5/5 in all 4 extremities.  There is no pronator drift.  Sensation is symmetric in the face, arms, legs.  There is no extinction on double simultaneous stimulation.  ED Results / Procedures / Treatments   Labs (all labs ordered are listed, but only abnormal results are displayed) Labs Reviewed  PROTIME-INR - Abnormal; Notable for the following components:      Result Value   Prothrombin Time 28.2 (*)    INR 2.7 (*)    All other components within normal limits  APTT - Abnormal; Notable for the following components:   aPTT 45 (*)    All other components within normal limits  COMPREHENSIVE METABOLIC PANEL - Abnormal; Notable for the following components:   Creatinine, Ser 1.49 (*)    Calcium 8.6 (*)    ALT 56 (*)    GFR, Estimated 48 (*)    All other components within normal limits  URINALYSIS, ROUTINE W REFLEX MICROSCOPIC - Abnormal; Notable for the following components:   Glucose, UA >=500 (*)    Protein, ur 30 (*)    All other components within normal limits  ETHANOL  CBC  DIFFERENTIAL  RAPID URINE DRUG SCREEN, HOSP PERFORMED  CBG MONITORING, ED    EKG EKG Interpretation  Date/Time:  Tuesday July 01 2022 00:18:20 EDT Ventricular Rate:  74 PR Interval:  130 QRS Duration: 100 QT Interval:  379 QTC Calculation: 421 R Axis:   46 Text Interpretation: Sinus rhythm Multiple premature complexes, vent & supraven RSR' in V1 or V2, right VCD or RVH Inferior infarct, old Anterolateral infarct, age indeterminate When compared with ECG of 01/10/2022, T wave inversion Lateral leads is no longer present Confirmed by Delora Fuel ([id number]) on 07/01/2022 12:22:07 AM  Radiology CT HEAD CODE STROKE WO CONTRAST  Result Date: 07/01/2022 CLINICAL DATA:  Code stroke.  Left facial numbness EXAM: CT HEAD WITHOUT CONTRAST TECHNIQUE: Contiguous axial images were  obtained from the base of the skull through the vertex without intravenous contrast. RADIATION DOSE REDUCTION: This exam was performed according to the departmental dose-optimization program which includes automated exposure control, adjustment of the mA and/or kV according to patient size and/or use of iterative reconstruction technique. COMPARISON:  09/16/2020 FINDINGS: Brain: There is no mass, hemorrhage or extra-axial collection. The size and configuration of the ventricles and extra-axial CSF spaces are normal. There are old left frontal, left occipital and right cerebellar infarcts Vascular: No abnormal hyperdensity of the major intracranial arteries or dural venous sinuses. No intracranial atherosclerosis. Skull: The visualized skull base, calvarium and extracranial soft tissues are normal. Sinuses/Orbits: No fluid levels or advanced mucosal thickening of the visualized paranasal sinuses. No mastoid  or middle ear effusion. The orbits are normal. ASPECTS [facility] Stroke Program Early CT Score) - Ganglionic level infarction (caudate, lentiform nuclei, internal capsule, insula, M1-M3 cortex): 7 - Supraganglionic infarction (M4-M6 cortex): 3 Total score (0-10 with 10 being normal): 10 IMPRESSION: 1. No acute intracranial abnormality. 2. Old left frontal, left occipital and right cerebellar infarcts. 3. ASPECTS is 10. These results were called by telephone at the time of interpretation on 07/01/2022 at 12:47 am to provider Ticara Waner [facility] , who verbally acknowledged these results. Electronically Signed   By: Ulyses Jarred M.D.   On: 07/01/2022 00:49    Procedures Procedures  Cardiac monitor shows normal sinus rhythm, per my interpretation.  Medications Ordered in ED Medications - No data to display  ED Course/ Medical Decision Making/ A&P                             Medical Decision Making Amount and/or Complexity of Data Reviewed Labs: ordered. Radiology: ordered.  Risk Decision regarding  hospitalization.   Subjective numbness of the left arm which has spread to the left side of the face.  I do not have any objective neurologic deficits on exam, but with patient's difficulty as a historian I feel there is still a possibility of stroke, so I have activated code stroke.  I suspect his arm numbness is actually cervical radiculopathy, but that should not affect the face.  I have reviewed and interpreted his electrocardiogram, and my interpretation is multiple PVCs, possible right ventricular hypertrophy, generally flat T waves which are actually improved compared with prior.  I have reviewed his past records, and he has hospital admissions on 06/06/2012 and 03/07/2017 for stroke.  CT scan shows presence of old infarcts but no acute changes.  I have independently viewed the images and also independently discussed the findings with radiologist, I agree with his interpretation.  Teleneurology consultation is appreciated, and no objective findings, but with his history there is certainly concern for stroke versus TIA.  I have gone back to reevaluate the patient, numbness is still present but diminished.  I have reviewed and interpreted his laboratory work and my interpretation is stable renal insufficiency, therapeutic INR, negative urine drug screen, mild proteinuria.  Teleneurology recommends admission for stroke workup including TIA.  Last workup including vascular imaging was in 2018, he probably should have MRI of the brain and MRA of the head and carotid ultrasound done.  I have discussed the case with Dr. Josephine Cables of [facility], who agrees to admit the patient.  CRITICAL CARE Performed by: Delora Fuel Total critical care time: 40 minutes Critical care time was exclusive of separately billable procedures and treating other patients. Critical care was necessary to treat or prevent imminent or life-threatening deterioration. Critical care was time spent personally by me on the following  activities: development of treatment plan with patient and/or surrogate as well as nursing, discussions with consultants, evaluation of patient's response to treatment, examination of patient, obtaining history from patient or surrogate, ordering and performing treatments and interventions, ordering and review of laboratory studies, ordering and review of radiographic studies, pulse oximetry and re-evaluation of patient's condition.  Final Clinical Impression(s) / ED Diagnoses Final diagnoses:  Paresthesia  Renal insufficiency  Anticoagulated on warfarin    Rx / DC Orders ED Discharge Orders     None         Delora Fuel, MD [id number] [phone]

## 2022-07-01 NOTE — Consult Note (Signed)
I connected with  [name] on 07/01/22 by a video enabled telemedicine application and verified that I am speaking with the correct person using two identifiers.   I discussed the limitations of evaluation and management by telemedicine. The patient expressed understanding and agreed to proceed.  Location of patient: [facility] Location of physician: [facility]  Neurology Consultation Reason for Consult: Stroke Referring Physician: Dr. Bernadette Hoit  CC: Left arm numbness  History is obtained from: Patient, daughter at bedside, chart review  HPI: [name] is a 77 y.o. male with past medical history of strokes without any significant residual deficit, cardiac thrombus and PEs and therefore on Coumadin (had PE while on Eliquis, therefore switched to Coumadin in 2022) who presented with left arm numbness.  Patient is a poor historian.  States he has noticed off-and-on numbness in left side of face and left shoulder/chest area for the last 2 to 3 days.  States numbness is still ongoing.  Reports compliance with Coumadin, INR is also 2.7.  Denies any muscle twitching, jerking.  Denies any recent infection.  Denies any weakness, slurred speech, facial droop, blurred vision.  ROS: All other systems reviewed and negative except as noted in the HPI.  Past Medical History:  Diagnosis Date   Apical mural thrombus    Arteriosclerotic cardiovascular disease (ASCVD)    a. s/p CABG in 2011 b. DES to RCA in 08/2017 c. cath in 06/2018 showing patent LIMA-LAD, SVG-OM1-OM2 and SVG-D1   Chronic anticoagulation    Chronic systolic CHF (congestive heart failure) ([place])    a. EF 45-50% in 06/2012 b. 35-40% in 08/2017 with similar results in 06/2018 c. EF at 30-35% in 10/2020   CVA (cerebral vascular accident) ([place])    HTN (hypertension)    Hyperlipidemia    Keloid    median sternotomy   Pulmonary embolism ([place]) 03/2010   Substance abuse ([place])    formerly cocaine     Family History  Problem Relation Age of Onset   Cancer Mother 44     Social History:  reports that he quit smoking about 20 years ago. His smoking use included cigarettes. He has never used smokeless tobacco. He reports that he does not currently use alcohol. He reports that he does not use drugs.  Medications Prior to Admission  Medication Sig Dispense Refill Last Dose   atorvastatin (LIPITOR) 40 MG tablet Take 1 tablet (40 mg total) by mouth daily at 6 PM. 30 tablet 0 06/30/2022   clopidogrel (PLAVIX) 75 MG tablet Take 75 mg by mouth daily.   06/30/2022 at am   furosemide (LASIX) 20 MG tablet TAKE 3 TABLETS BY MOUTH EVERY DAY 270 tablet 3 06/30/2022   hydrALAZINE (APRESOLINE) 50 MG tablet Take 1 tablet (50 mg total) by mouth 3 (three) times daily. 270 tablet 3 06/30/2022   JARDIANCE 10 MG TABS tablet TAKE 1 TABLET BY MOUTH DAILY BEFORE BREAKFAST. 90 tablet 3 06/30/2022   metoprolol succinate (TOPROL-XL) 50 MG 24 hr tablet Take 50 mg by mouth daily.  3 06/30/2022   nitroGLYCERIN (NITROSTAT) 0.4 MG SL tablet Place 1 tablet (0.4 mg total) under the tongue every 5 (five) minutes as needed for chest pain. up to 3 doses. 25 tablet 4 unk   spironolactone (ALDACTONE) 25 MG tablet Take 1 tablet (25 mg total) by mouth daily. 90 tablet 3 06/30/2022   warfarin (COUMADIN) 5 MG tablet TAKE 1/2 TABLET TO 1 TABLET DAILY OR AS DIRECTED (Patient  taking differently: Take 5 mg by mouth daily. Take 1/2 tablet to 1 tablet daily or as directed) 60 tablet 1 06/30/2022 at am   HYDROcodone-acetaminophen (NORCO) 5-325 MG tablet Take 1 tablet by mouth every 6 (six) hours as needed for severe pain. (Patient not taking: Reported on 07/01/2022) 10 tablet 0 Not Taking   isosorbide mononitrate (IMDUR) 60 MG 24 hr tablet Take 1 tablet (60 mg total) by mouth daily. 90 tablet 3       Exam: Current vital signs: BP (!) 149/75   Pulse 80   Temp (!) 97.4 F (36.3 C)   Resp 20   Ht 5\' 6"  (1.676 m)   Wt 95.7 kg   SpO2 100%    BMI 34.05 kg/m  Vital signs in last 24 hours: Temp:  [97.4 F (36.3 C)-98.3 F (36.8 C)] 97.4 F (36.3 C) (03/26 0925) Pulse Rate:  [44-80] 80 (03/26 0925) Resp:  [12-24] 20 (03/26 0925) BP: (115-187)/(57-87) 149/75 (03/26 0925) SpO2:  [94 %-100 %] 100 % (03/26 0925) Weight:  [95.7 kg-97 kg] 95.7 kg (03/26 0925)   Physical Exam  Constitutional: Appears well-developed and well-nourished.  Neuro: AOx3, cranial nerves II to XII grossly intact (hard of hearing), antigravity strength without drift in all 4 extremities, FTN intact bilaterally, decree sensation to touch on left side of face and left shoulder   NIHSS 1   INPUTS: 1A: Level of consciousness --> 0 = Alert; keenly responsive 1B: Ask month and age --> 0 = Both questions right 1C: 'Blink eyes' & 'squeeze hands' --> 0 = Performs both tasks 2: Horizontal extraocular movements --> 0 = Normal 3: Visual fields --> 0 = No visual loss 4: Facial palsy --> 0 = Normal symmetry 5A: Left arm motor drift --> 0 = No drift for 10 seconds 5B: Right arm motor drift --> 0 = No drift for 10 seconds 6A: Left leg motor drift --> 0 = No drift for 5 seconds 6B: Right leg motor drift --> 0 = No drift for 5 seconds 7: Limb Ataxia --> 0 = No ataxia 8: Sensation --> 1 = Mild-moderate loss: can sense being touched  9: Language/aphasia --> 0 = Normal; no aphasia 10: Dysarthria --> 0 = Normal 11: Extinction/inattention --> 0 = No abnormality   I have reviewed labs in epic and the results pertinent to this consultation are: CBC:  Recent Labs  Lab 06/30/22 1108 07/01/22 0038  WBC 5.2 6.9  NEUTROABS 2.0 2.3  HGB 16.7 16.4  HCT 52.3* 51.2  MCV 90.8 91.8  PLT 176 0000000    Basic Metabolic Panel:  Lab Results  Component Value Date   NA 135 07/01/2022   K 4.0 07/01/2022   CO2 25 07/01/2022   GLUCOSE 99 07/01/2022   BUN 18 07/01/2022   CREATININE 1.49 (H) 07/01/2022   CALCIUM 8.6 (L) 07/01/2022   GFRNONAA 48 (L) 07/01/2022   GFRAA >60  10/10/2019   Lipid Panel:  Lab Results  Component Value Date   LDLCALC 1 07/01/2022   HgbA1c:  Lab Results  Component Value Date   HGBA1C 5.6 03/09/2017   Urine Drug Screen:     Component Value Date/Time   LABOPIA NONE DETECTED 07/01/2022 0135   COCAINSCRNUR NONE DETECTED 07/01/2022 0135   LABBENZ NONE DETECTED 07/01/2022 0135   AMPHETMU NONE DETECTED 07/01/2022 0135   THCU NONE DETECTED 07/01/2022 0135   LABBARB NONE DETECTED 07/01/2022 0135    Alcohol Level     Component Value Date/Time  ETH <10 07/01/2022 0038     I have reviewed the images obtained:  CT Head without contrast 07/01/2022: No acute intracranial abnormality.  Old left frontal, left occipital and right cerebellar infarcts. ASPECTS is 10.  MRI Brain without contrast 07/01/2022:  No acute intracranial process. Chronic infarcts in the right cerebellum, left frontal lobe, and left occipital lobe. Degenerative changes in the upper cervical spine with mild to moderate spinal canal narrowing at C2-C3 and C3-C4.  MRA head without contrast 07/01/2022: Normal intracranial MR angiography of the large and medium size vessels.  ASSESSMENT/PLAN: 77 year old male with chronic strokes without residual deficits, prior apical thrombus as well as PE, on Coumadin who presented with left-sided paresthesias ongoing for 2 to 3 days.  Left sided paresthesias -As face is also involved, unlikely due to cervical stenosis.  Most likely MRI negative stroke.  Of note patient had similar presentation in 2014 but also had facial spasm at that time.  EEG was negative but EEG was performed due to concern for seizures. -Stroke etiology: Could be small vessel disease  Recommendations: -Will obtain carotid ultrasound and TTE for stroke workup -Dr. Carles Collet discussed Dr. Delton Coombes (hematology) to see if there is any benefit to switching from Coumadin to Xarelto.  Of note patient was on Eliquis but was switched to Coumadin 2022 when he had PE while  being compliant on Eliquis.  Due to minor symptoms, negative MRI, radiology recommended continuing Coumadin.  I agree with this as there is no evidence that switching anticoagulant in such cases would be beneficial -Patient is already on Plavix, recommend continuing -LDL is 1, can continue home atorvastatin 40 mg daily -Even though low suspicion, will get EEG to rule out seizures due to similar presentation in the past.  Will not start antiseizure medications unless ictal-interictal abnormality on EEG -Goal blood pressure: Normotension - PT/OT -Follow-up with neurology, order placed -Discussed plan with patient, Dr. Carles Collet  Thank you for allowing [place] to participate in the care of this patient. If you have any further questions, please contact  me or neurohospitalist.   Zeb [facility]

## 2022-07-02 LAB — HEMOGLOBIN A1C
Hgb A1c MFr Bld: 5.7 % — ABNORMAL HIGH (ref 4.8–5.6)
Mean Plasma Glucose: 117 mg/dL

## 2022-07-06 NOTE — Progress Notes (Unsigned)
[name] [place], [place] [address]   CLINIC:  Medical Oncology/Hematology  PCP:  [name], MD [place] [address] [phone]   REASON FOR VISIT:  Follow-up for recurrent PE  PRIOR THERAPY: Eliquis  CURRENT THERAPY: Warfarin  INTERVAL HISTORY:   [name] 77 y.o. 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  He was last seen by Tarri Abernethy PA-C on 08/26/2021.    He returns today for posthospital follow-up after being admitted inpatient (07/01/2022) and found to have TIA and new LV thrombus despite therapeutic INR 2.7, therefore referred back to hematology for additional workup.  At today's visit, he reports feeling fair.  He reports that his TIA symptoms resolved.  He is taking Plavix and warfarin as prescribed and denies any symptoms of bleeding.  He has baseline dyspnea on exertion but denies any chest pain, palpitations, leg swelling, or shortness of breath at rest.  He has 70% energy and 85% appetite. He endorses that he is maintaining a stable weight.  ASSESSMENT & PLAN:  1.  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  Follows at Coumadin clinic for INR checks and dosing adjustments.   - Admitted inpatient (07/01/2022) and found to have TIA and  new/recurrent LV thrombus despite therapeutic INR 2.7, therefore referred back to hematology for additional recommendations. - He denies any major bleeding events.   - No symptoms of recurrent DVT or PE.  TIA symptoms have resolved. - PLAN: Due to evident failure of both Eliquis and warfarin, we will switch patient to LOVENOX injections (1 mg/kg twice daily), indefinitely. - Labs in 2 months to include coagulopathy  studies for completeness, although these will not change anticoagulation recommendations. - RTC 2 weeks after labs  PLAN SUMMARY: >> Prescription for Lovenox sent to pharmacy - RN to coordinate financial assistance if needed >> Labs in 2 months = CBC/D, ferritin, iron/TIBC, CMP, D-dimer, lupus anticoagulant panel, anticardiolipin antibodies, antibeta 2 glycoprotein antibodies, PT gene mutation, factor V Leiden >> OFFICE visit in 2 months (2 weeks after labs)     REVIEW OF SYSTEMS:   Review of Systems  Constitutional:  Positive for fatigue (Baseline). Negative for appetite change, chills, diaphoresis, fever and unexpected weight change.  HENT:   Negative for lump/mass and nosebleeds.   Eyes:  Negative for eye problems.  Respiratory:  Positive for shortness of breath (With exertion, baseline). Negative for cough and hemoptysis.   Cardiovascular:  Negative for chest pain, leg swelling and palpitations.  Gastrointestinal:  Negative for abdominal pain, blood in stool, constipation, diarrhea, nausea and vomiting.  Genitourinary:  Negative for hematuria.   Skin: Negative.   Neurological:  Negative for dizziness, headaches and light-headedness.  Hematological:  Does not bruise/bleed easily.  Psychiatric/Behavioral:  Positive for sleep disturbance.      PHYSICAL EXAM:  ECOG PERFORMANCE STATUS: 1 - Symptomatic but  completely ambulatory  There were no vitals filed for this visit. There were no vitals filed for this visit. Physical Exam Constitutional:      Appearance: Normal  appearance. He is obese.  Cardiovascular:     Heart sounds: Normal heart sounds.  Pulmonary:     Breath sounds: Normal breath sounds.  Neurological:     General: No focal deficit present.     Mental Status: Mental status is at baseline.  Psychiatric:        Behavior: Behavior normal. Behavior is cooperative.     PAST MEDICAL/SURGICAL HISTORY:  Past Medical History:  Diagnosis Date   Apical mural thrombus    Arteriosclerotic cardiovascular disease (ASCVD)    a. s/p CABG in 2011 b. DES to RCA in 08/2017 c. cath in 06/2018 showing patent LIMA-LAD, SVG-OM1-OM2 and SVG-D1   Chronic anticoagulation    Chronic systolic CHF (congestive heart failure) ([place])    a. EF 45-50% in 06/2012 b. 35-40% in 08/2017 with similar results in 06/2018 c. EF at 30-35% in 10/2020   CVA (cerebral vascular accident) ([place])    HTN (hypertension)    Hyperlipidemia    Keloid    median sternotomy   Pulmonary embolism ([place]) 03/2010   Substance abuse ([place])    formerly cocaine   Past Surgical History:  Procedure Laterality Date   COLONOSCOPY N/A 01/22/2017   Procedure: COLONOSCOPY;  Surgeon: Danie Binder, MD;  [facility];  Service: Endoscopy;  Laterality: N/A;  200   CORONARY ARTERY BYPASS GRAFT  03/18/2010   LIMA-LAD, SVG to diagonal, OM1 & OM2   CORONARY STENT INTERVENTION N/A 08/21/2017   Procedure: CORONARY STENT INTERVENTION;  Surgeon: [name], MD;  Location: [facility];  Service: Cardiovascular;  Laterality: N/A;   LEFT HEART CATH AND CORS/GRAFTS ANGIOGRAPHY N/A 08/21/2017   Procedure: LEFT HEART CATH AND CORS/GRAFTS ANGIOGRAPHY;  Surgeon: [name], MD;  Location: [facility];  Service: Cardiovascular;  Laterality: N/A;   LEFT HEART CATH AND CORS/GRAFTS ANGIOGRAPHY N/A 06/21/2018   Procedure: LEFT HEART CATH AND CORS/GRAFTS ANGIOGRAPHY;  Surgeon: Nelva Bush, MD;  Location: [facility];  Service: Cardiovascular;  Laterality: N/A;   POLYPECTOMY   01/22/2017   Procedure: POLYPECTOMY;  Surgeon: Danie Binder, MD;  Location: [facility];  Service: Endoscopy;;  Transverse(CS) and sigmoid colon(HS)   PTCA  06/1996   LAD & RCA   TEE WITHOUT CARDIOVERSION N/A 06/10/2012   Procedure: TRANSESOPHAGEAL ECHOCARDIOGRAM (TEE);  Surgeon: Josue Hector, MD;  Location: [facility];  Service: Cardiovascular;  Laterality: N/A;    SOCIAL HISTORY:  Social History   Socioeconomic History   Marital status: Widowed    Spouse name: Not on file   Number of children: Not on file   Years of education: Not on file   Highest education level: Not on file  Occupational History   Occupation: disability  Tobacco Use   Smoking status: Former    Types: Cigarettes    Quit date: 2004    Years since quitting: 20.2   Smokeless tobacco: Never  Vaping Use   Vaping Use: Never used  Substance and Sexual Activity   Alcohol use: Not Currently    Comment: rare   Drug use: No    Types: Cocaine    Comment: Former cocaine abuse - last use for 51th birthday   Sexual activity: Not Currently  Other Topics Concern   Not on file  Social History Narrative  Not on file   Social Determinants of Health   Financial Resource Strain: Low Risk  (10/30/2020)   Overall Financial Resource Strain (CARDIA)    Difficulty of Paying Living Expenses: Not very hard  Food Insecurity: No Food Insecurity (07/01/2022)   Hunger Vital Sign    Worried About Running Out of Food in the Last Year: Never true    Ran Out of Food in the Last Year: Never true  Transportation Needs: No Transportation Needs (07/01/2022)   PRAPARE - Hydrologist (Medical): No    Lack of Transportation (Non-Medical): No  Physical Activity: Insufficiently Active (10/30/2020)   Exercise Vital Sign    Days of Exercise per Week: 2 days    Minutes of Exercise per Session: 20 min  Stress: No Stress Concern Present (10/30/2020)   [place]    Feeling of Stress : Only a little  Social Connections: Moderately Isolated (10/30/2020)   Social Connection and Isolation Panel [NHANES]    Frequency of Communication with Friends and Family: Three times a week    Frequency of Social Gatherings with Friends and Family: Twice a week    Attends Religious Services: 1 to 4 times per year    Active Member of [employer] or Organizations: No    Attends Archivist Meetings: Never    Marital Status: Widowed  Intimate Partner Violence: Not At Risk (07/01/2022)   Humiliation, Afraid, Rape, and Kick questionnaire    Fear of Current or Ex-Partner: No    Emotionally Abused: No    Physically Abused: No    Sexually Abused: No    FAMILY HISTORY:  Family History  Problem Relation Age of Onset   Cancer Mother 110    CURRENT MEDICATIONS:  Outpatient Encounter Medications as of 07/07/2022  Medication Sig   atorvastatin (LIPITOR) 40 MG tablet Take 1 tablet (40 mg total) by mouth daily at 6 PM.   clopidogrel (PLAVIX) 75 MG tablet Take 75 mg by mouth daily.   furosemide (LASIX) 20 MG tablet TAKE 3 TABLETS BY MOUTH EVERY DAY   hydrALAZINE (APRESOLINE) 50 MG tablet Take 1 tablet (50 mg total) by mouth 3 (three) times daily.   isosorbide mononitrate (IMDUR) 60 MG 24 hr tablet Take 1 tablet (60 mg total) by mouth daily.   JARDIANCE 10 MG TABS tablet TAKE 1 TABLET BY MOUTH DAILY BEFORE BREAKFAST.   metoprolol succinate (TOPROL-XL) 50 MG 24 hr tablet Take 50 mg by mouth daily.   nitroGLYCERIN (NITROSTAT) 0.4 MG SL tablet Place 1 tablet (0.4 mg total) under the tongue every 5 (five) minutes as needed for chest pain. up to 3 doses.   spironolactone (ALDACTONE) 25 MG tablet Take 1 tablet (25 mg total) by mouth daily.   warfarin (COUMADIN) 5 MG tablet TAKE 1/2 TABLET TO 1 TABLET DAILY OR AS DIRECTED (Patient taking differently: Take 5 mg by mouth daily. Take 1/2 tablet to 1 tablet daily or as directed)   No facility-administered encounter  medications on file as of 07/07/2022.    ALLERGIES:  Allergies  Allergen Reactions   Lisinopril Swelling and Other (See Comments)    Mouth and tongue swells    LABORATORY DATA:  I have reviewed the labs as listed.  CBC    Component Value Date/Time   WBC 6.9 07/01/2022 0038   RBC 5.58 07/01/2022 0038   HGB 16.4 07/01/2022 0038   HCT 51.2 07/01/2022 0038  PLT 160 07/01/2022 0038   MCV 91.8 07/01/2022 0038   MCH 29.4 07/01/2022 0038   MCHC 32.0 07/01/2022 0038   RDW 13.0 07/01/2022 0038   LYMPHSABS 3.6 07/01/2022 0038   MONOABS 0.8 07/01/2022 0038   EOSABS 0.2 07/01/2022 0038   BASOSABS 0.0 07/01/2022 0038      Latest Ref Rng & Units 07/01/2022   12:38 AM 06/30/2022   11:08 AM 01/10/2022   11:05 AM  CMP  Glucose 70 - 99 mg/dL 99  75  90   BUN 8 - 23 mg/dL 18  15  15    Creatinine 0.61 - 1.24 mg/dL 1.49  1.45  1.37   Sodium 135 - 145 mmol/L 135  135  138   Potassium 3.5 - 5.1 mmol/L 4.0  4.3  3.9   Chloride 98 - 111 mmol/L 103  101  107   CO2 22 - 32 mmol/L 25  25  24    Calcium 8.9 - 10.3 mg/dL 8.6  8.8  8.7   Total Protein 6.5 - 8.1 g/dL 7.4  7.8    Total Bilirubin 0.3 - 1.2 mg/dL 1.2  1.8    Alkaline Phos 38 - 126 U/L 69  59    AST 15 - 41 U/L 40  38    ALT 0 - 44 U/L 56  61      DIAGNOSTIC IMAGING:  I have independently reviewed the relevant imaging and discussed with the patient.   WRAP UP:  All questions were answered. The patient knows to call the clinic with any problems, questions or concerns.  Medical decision making: Moderate  Time spent on visit: I spent 20 minutes counseling the patient face to face. The total time spent in the appointment was 30 minutes and more than 50% was on counseling.  Harriett Rush, PA-C  07/07/2022 9:51 AM

## 2022-07-07 ENCOUNTER — Inpatient Hospital Stay: Payer: 59 | Attending: Physician Assistant | Admitting: Physician Assistant

## 2022-07-07 ENCOUNTER — Other Ambulatory Visit: Payer: Self-pay

## 2022-07-07 VITALS — BP 118/63 | HR 70 | Temp 97.5°F | Resp 18 | Ht 66.0 in | Wt 207.0 lb

## 2022-07-07 DIAGNOSIS — I2699 Other pulmonary embolism without acute cor pulmonale: Secondary | ICD-10-CM

## 2022-07-07 DIAGNOSIS — Z87891 Personal history of nicotine dependence: Secondary | ICD-10-CM | POA: Insufficient documentation

## 2022-07-07 DIAGNOSIS — I69398 Other sequelae of cerebral infarction: Secondary | ICD-10-CM | POA: Diagnosis not present

## 2022-07-07 DIAGNOSIS — Z7902 Long term (current) use of antithrombotics/antiplatelets: Secondary | ICD-10-CM | POA: Insufficient documentation

## 2022-07-07 DIAGNOSIS — I513 Intracardiac thrombosis, not elsewhere classified: Secondary | ICD-10-CM | POA: Diagnosis not present

## 2022-07-07 DIAGNOSIS — I2510 Atherosclerotic heart disease of native coronary artery without angina pectoris: Secondary | ICD-10-CM | POA: Insufficient documentation

## 2022-07-07 DIAGNOSIS — R0602 Shortness of breath: Secondary | ICD-10-CM | POA: Insufficient documentation

## 2022-07-07 DIAGNOSIS — I2694 Multiple subsegmental pulmonary emboli without acute cor pulmonale: Secondary | ICD-10-CM

## 2022-07-07 DIAGNOSIS — Z888 Allergy status to other drugs, medicaments and biological substances status: Secondary | ICD-10-CM | POA: Diagnosis not present

## 2022-07-07 DIAGNOSIS — R5383 Other fatigue: Secondary | ICD-10-CM | POA: Insufficient documentation

## 2022-07-07 DIAGNOSIS — Z86711 Personal history of pulmonary embolism: Secondary | ICD-10-CM | POA: Insufficient documentation

## 2022-07-07 DIAGNOSIS — E669 Obesity, unspecified: Secondary | ICD-10-CM | POA: Diagnosis not present

## 2022-07-07 DIAGNOSIS — Z79899 Other long term (current) drug therapy: Secondary | ICD-10-CM | POA: Insufficient documentation

## 2022-07-07 MED ORDER — ENOXAPARIN SODIUM 100 MG/ML IJ SOSY: 1.0000 mg/kg | PREFILLED_SYRINGE | Freq: Two times a day (BID) | INTRAMUSCULAR | 6 refills | Status: DC

## 2022-07-07 NOTE — Patient Instructions (Addendum)
[facility] **   You were seen today by Tarri Abernethy PA-C for your blood clots.    BLOOD CLOTS You have had multiple blood clots (blood clots in your heart and blood clots in your lungs). You had blood clots even though you were on Eliquis (in the past). During your most recent hospital stay, it was discovered that you had a new blood clot in your heart even though you had been taking Coumadin (warfarin). Therefore, it is very important that we switch you to a new type of blood thinner.  You will need to start taking a blood thinner called LOVENOX (ENOXAPARIN INJECTIONS). You will take these injections twice a day (once in the morning once in the evening) for the rest of your life. Please ask the pharmacist to show you how to give yourself this injection. Once you have these injections from the pharmacy, you will STOP warfarin and take these injections instead.  (Continue to take your warfarin until you have started your Lovenox injections.)  **Please let our office know if you have any difficulty obtaining your Lovenox injections!  LABS: Return in 2 months for repeat labs  FOLLOW-UP APPOINTMENT: Office visit in 2 months (1 to 2 weeks after labs)  ** Thank you for trusting me with your healthcare!  I strive to provide all of my patients with quality care at each visit.  If you receive a survey for this visit, I would be so grateful to you for taking the time to provide feedback.  Thank you in advance!  ~ Kyna Blahnik                   Dr. Derek Jack   &   Tarri Abernethy, PA-C   - - - - - - - - - - - - - - - - - -    Thank you for choosing [facility] to provide your oncology and hematology care.  To afford each patient quality time with our provider, please arrive at least 15 minutes before your scheduled appointment time.   If you have a lab appointment with  the [facility] please come in thru the Main Entrance and check in at the main information desk.  You need to re-schedule your appointment should you arrive 10 or more minutes late.  We strive to give you quality time with our providers, and arriving late affects you and other patients whose appointments are after yours.  Also, if you no show three or more times for appointments you may be dismissed from the clinic at the providers discretion.     Again, thank you for choosing [facility].  Our hope is that these requests will decrease the amount of time that you wait before being seen by our physicians.       _____________________________________________________________  Should you have questions after your visit to [facility], please contact our office at [phone] and follow the prompts.  Our office hours are 8:00 a.m. and 4:30 p.m. Monday - Friday.  Please note that voicemails left after 4:00 p.m. may not be returned until the following business day.  We are closed weekends and major holidays.  You do have access to a nurse 24-7, just call the main number to the clinic [phone] and do not press any options, hold on the line and a nurse will answer the  phone.    For prescription refill requests, have your pharmacy contact our office and allow 72 hours.

## 2022-07-10 ENCOUNTER — Other Ambulatory Visit (HOSPITAL_COMMUNITY)
Admission: RE | Admit: 2022-07-10 | Discharge: 2022-07-10 | Disposition: A | Discharge status: Home / Self Care | Payer: 59 | Source: Ambulatory Visit | Attending: Internal Medicine | Admitting: Internal Medicine

## 2022-07-10 DIAGNOSIS — Z0001 Encounter for general adult medical examination with abnormal findings: Secondary | ICD-10-CM | POA: Diagnosis present

## 2022-07-10 DIAGNOSIS — G4733 Obstructive sleep apnea (adult) (pediatric): Secondary | ICD-10-CM | POA: Diagnosis not present

## 2022-07-10 DIAGNOSIS — I10 Essential (primary) hypertension: Secondary | ICD-10-CM | POA: Insufficient documentation

## 2022-07-10 LAB — BASIC METABOLIC PANEL
Anion gap: 5 (ref 5–15)
BUN: 14 mg/dL (ref 8–23)
CO2: 28 mmol/L (ref 22–32)
Calcium: 8.5 mg/dL — ABNORMAL LOW (ref 8.9–10.3)
Chloride: 103 mmol/L (ref 98–111)
Creatinine, Ser: 1.51 mg/dL — ABNORMAL HIGH (ref 0.61–1.24)
GFR, Estimated: 48 mL/min — ABNORMAL LOW (ref >60)
Glucose, Bld: 86 mg/dL (ref 70–99)
Potassium: 4 mmol/L (ref 3.5–5.1)
Sodium: 136 mmol/L (ref 135–145)

## 2022-07-10 LAB — CBC WITH DIFFERENTIAL/PLATELET
Abs Immature Granulocytes: 0.01 10*3/uL (ref 0.00–0.07)
Basophils Absolute: 0 10*3/uL (ref 0.0–0.1)
Basophils Relative: 1 %
Eosinophils Absolute: 0.2 10*3/uL (ref 0.0–0.5)
Eosinophils Relative: 3 %
HCT: 49.7 % (ref 39.0–52.0)
Hemoglobin: 15.9 g/dL (ref 13.0–17.0)
Immature Granulocytes: 0 %
Lymphocytes Relative: 47 %
Lymphs Abs: 2.4 10*3/uL (ref 0.7–4.0)
MCH: 29.1 pg (ref 26.0–34.0)
MCHC: 32 g/dL (ref 30.0–36.0)
MCV: 91 fL (ref 80.0–100.0)
Monocytes Absolute: 0.7 10*3/uL (ref 0.1–1.0)
Monocytes Relative: 14 %
Neutro Abs: 1.7 10*3/uL (ref 1.7–7.7)
Neutrophils Relative %: 35 %
Platelets: 150 10*3/uL (ref 150–400)
RBC: 5.46 MIL/uL (ref 4.22–5.81)
RDW: 13.3 % (ref 11.5–15.5)
WBC: 5 10*3/uL (ref 4.0–10.5)
nRBC: 0 % (ref 0.0–0.2)

## 2022-07-23 ENCOUNTER — Other Ambulatory Visit: Payer: Self-pay | Admitting: *Deleted

## 2022-07-23 DIAGNOSIS — I513 Intracardiac thrombosis, not elsewhere classified: Secondary | ICD-10-CM

## 2022-07-23 DIAGNOSIS — I2699 Other pulmonary embolism without acute cor pulmonale: Secondary | ICD-10-CM

## 2022-07-23 MED ORDER — ENOXAPARIN SODIUM 100 MG/ML IJ SOSY: 1.0000 mg/kg | PREFILLED_SYRINGE | Freq: Two times a day (BID) | INTRAMUSCULAR | 6 refills | Status: DC

## 2022-07-23 NOTE — Telephone Encounter (Signed)
[facility] does not have patient's dose of lovenox.  Script sent to [facility] for fill.

## 2022-08-14 ENCOUNTER — Ambulatory Visit: Payer: 59 | Attending: Internal Medicine | Admitting: *Deleted

## 2022-08-14 DIAGNOSIS — Z86711 Personal history of pulmonary embolism: Secondary | ICD-10-CM | POA: Diagnosis not present

## 2022-08-14 DIAGNOSIS — I513 Intracardiac thrombosis, not elsewhere classified: Secondary | ICD-10-CM | POA: Diagnosis not present

## 2022-08-14 DIAGNOSIS — Z5181 Encounter for therapeutic drug level monitoring: Secondary | ICD-10-CM | POA: Diagnosis not present

## 2022-08-14 LAB — POCT INR: INR: 1.7 — AB (ref 2.0–3.0)

## 2022-08-14 NOTE — Patient Instructions (Signed)
Take warfarin 1 tablet tonight then continue 1/2 a tablet daily except for 1 tablet on Sunday. Recheck INR in 2 weeks.

## 2022-08-15 ENCOUNTER — Other Ambulatory Visit: Payer: Self-pay | Admitting: Internal Medicine

## 2022-08-15 ENCOUNTER — Other Ambulatory Visit: Payer: Self-pay | Admitting: Student

## 2022-08-15 DIAGNOSIS — Z8673 Personal history of transient ischemic attack (TIA), and cerebral infarction without residual deficits: Secondary | ICD-10-CM

## 2022-08-18 ENCOUNTER — Other Ambulatory Visit: Payer: 59

## 2022-08-18 ENCOUNTER — Ambulatory Visit: Payer: 59 | Admitting: Physician Assistant

## 2022-08-25 ENCOUNTER — Other Ambulatory Visit: Payer: Self-pay

## 2022-08-25 ENCOUNTER — Emergency Department (HOSPITAL_COMMUNITY)
Admission: EM | Admit: 2022-08-25 | Discharge: 2022-08-26 | Disposition: A | Discharge status: Home / Self Care | Payer: 59 | Attending: Emergency Medicine | Admitting: Emergency Medicine

## 2022-08-25 ENCOUNTER — Emergency Department (HOSPITAL_COMMUNITY): Payer: 59

## 2022-08-25 ENCOUNTER — Ambulatory Visit: Payer: 59 | Admitting: Physician Assistant

## 2022-08-25 DIAGNOSIS — Z7902 Long term (current) use of antithrombotics/antiplatelets: Secondary | ICD-10-CM | POA: Insufficient documentation

## 2022-08-25 DIAGNOSIS — Z79899 Other long term (current) drug therapy: Secondary | ICD-10-CM | POA: Diagnosis not present

## 2022-08-25 DIAGNOSIS — M70811 Other soft tissue disorders related to use, overuse and pressure, right shoulder: Secondary | ICD-10-CM | POA: Insufficient documentation

## 2022-08-25 DIAGNOSIS — M778 Other enthesopathies, not elsewhere classified: Secondary | ICD-10-CM

## 2022-08-25 DIAGNOSIS — R0789 Other chest pain: Secondary | ICD-10-CM | POA: Diagnosis present

## 2022-08-25 DIAGNOSIS — Y9389 Activity, other specified: Secondary | ICD-10-CM | POA: Diagnosis not present

## 2022-08-25 LAB — CBC
HCT: 49.7 % (ref 39.0–52.0)
Hemoglobin: 16 g/dL (ref 13.0–17.0)
MCH: 28.6 pg (ref 26.0–34.0)
MCHC: 32.2 g/dL (ref 30.0–36.0)
MCV: 88.9 fL (ref 80.0–100.0)
Platelets: 163 10*3/uL (ref 150–400)
RBC: 5.59 MIL/uL (ref 4.22–5.81)
RDW: 13.1 % (ref 11.5–15.5)
WBC: 6.2 10*3/uL (ref 4.0–10.5)
nRBC: 0 % (ref 0.0–0.2)

## 2022-08-25 NOTE — ED Triage Notes (Signed)
Pt c/o right sided chest and arm pain, states he took some tums earlier today and the pain subsided. Reports pain in arm is only when lifting. Denies any injury.

## 2022-08-25 NOTE — ED Notes (Signed)
Patient transported to X-ray 

## 2022-08-26 DIAGNOSIS — R0789 Other chest pain: Secondary | ICD-10-CM | POA: Diagnosis not present

## 2022-08-26 LAB — BASIC METABOLIC PANEL
Anion gap: 7 (ref 5–15)
BUN: 18 mg/dL (ref 8–23)
CO2: 23 mmol/L (ref 22–32)
Calcium: 8.9 mg/dL (ref 8.9–10.3)
Chloride: 106 mmol/L (ref 98–111)
Creatinine, Ser: 1.39 mg/dL — ABNORMAL HIGH (ref 0.61–1.24)
GFR, Estimated: 53 mL/min — ABNORMAL LOW (ref >60)
Glucose, Bld: 95 mg/dL (ref 70–99)
Potassium: 3.3 mmol/L — ABNORMAL LOW (ref 3.5–5.1)
Sodium: 136 mmol/L (ref 135–145)

## 2022-08-26 LAB — TROPONIN I (HIGH SENSITIVITY)
Troponin I (High Sensitivity): 7 ng/L (ref <18)
Troponin I (High Sensitivity): 8 ng/L (ref <18)

## 2022-08-26 NOTE — ED Provider Notes (Signed)
[facility] EMERGENCY DEPARTMENT AT [facility] Provider Note   CSN: [id number] Arrival date & time: 08/25/22  2313     History  Chief Complaint  Patient presents with   Chest Pain    [name] is a 77 y.o. male.  Patient presents to the emergency department for evaluation of chest pain.  Patient reports that symptoms occurred 3 times this evening.  He reports a sharp pain in the right chest that would last for 2 or 3 minutes and then resolved.  He took some Rolaids and it has not reoccurred.  He also has been noticing pain in the right arm that occurs when he lifts it out in front of him.  He denies injury.       Home Medications Prior to Admission medications   Medication Sig Start Date End Date Taking? Authorizing Provider  atorvastatin (LIPITOR) 40 MG tablet Take 1 tablet (40 mg total) by mouth daily at 6 PM. 03/09/17   Calvert Cantor, MD  clopidogrel (PLAVIX) 75 MG tablet Take 75 mg by mouth daily.    [provider]  enoxaparin (LOVENOX) 100 MG/ML injection Inject 0.95 mLs (95 mg total) into the skin every 12 (twelve) hours. 07/23/22   Carnella Guadalajara, PA-C  furosemide (LASIX) 20 MG tablet TAKE 3 TABLETS BY MOUTH EVERY DAY 10/02/20   Pricilla Riffle, MD  hydrALAZINE (APRESOLINE) 50 MG tablet Take 1 tablet (50 mg total) by mouth 3 (three) times daily. 10/15/21 10/10/22  Pricilla Riffle, MD  isosorbide mononitrate (IMDUR) 60 MG 24 hr tablet Take 1 tablet (60 mg total) by mouth daily. 09/12/20 06/29/22  Pricilla Riffle, MD  JARDIANCE 10 MG TABS tablet TAKE 1 TABLET BY MOUTH DAILY BEFORE BREAKFAST. 03/28/22   Pricilla Riffle, MD  metoprolol succinate (TOPROL-XL) 50 MG 24 hr tablet Take 50 mg by mouth daily. 04/27/17   [provider]  nitroGLYCERIN (NITROSTAT) 0.4 MG SL tablet Place 1 tablet (0.4 mg total) under the tongue every 5 (five) minutes as needed for chest pain. up to 3 doses. 07/05/13   Jodelle Gross, NP  spironolactone (ALDACTONE) 25 MG tablet  TAKE 1 TABLET (25 MG TOTAL) BY MOUTH DAILY. 08/15/22 08/10/23  Ellsworth Lennox, PA-C      Allergies    Lisinopril    Review of Systems   Review of Systems  Physical Exam Updated Vital Signs BP 115/76   Pulse (!) 47   Temp 97.8 F (36.6 C) (Oral)   Resp 10   Ht 5\' 6"  (1.676 m)   Wt 94 kg   SpO2 94%   BMI 33.45 kg/m  Physical Exam Vitals and nursing note reviewed.  Constitutional:      General: He is not in acute distress.    Appearance: He is well-developed.  HENT:     Head: Normocephalic and atraumatic.     Mouth/Throat:     Mouth: Mucous membranes are moist.  Eyes:     General: Vision grossly intact. Gaze aligned appropriately.     Extraocular Movements: Extraocular movements intact.     Conjunctiva/sclera: Conjunctivae normal.  Cardiovascular:     Rate and Rhythm: Normal rate and regular rhythm.     Pulses: Normal pulses.     Heart sounds: Normal heart sounds, S1 normal and S2 normal. No murmur heard.    No friction rub. No gallop.  Pulmonary:     Effort: Pulmonary effort is normal. No respiratory distress.  Breath sounds: Normal breath sounds.  Abdominal:     Palpations: Abdomen is soft.     Tenderness: There is no abdominal tenderness. There is no guarding or rebound.     Hernia: No hernia is present.  Musculoskeletal:        General: No swelling.     Right shoulder: Tenderness present. No swelling or deformity.     Cervical back: Full passive range of motion without pain, normal range of motion and neck supple. No pain with movement, spinous process tenderness or muscular tenderness. Normal range of motion.     Right lower leg: No edema.     Left lower leg: No edema.  Skin:    General: Skin is warm and dry.     Capillary Refill: Capillary refill takes less than 2 seconds.     Findings: No ecchymosis, erythema, lesion or wound.  Neurological:     Mental Status: He is alert and oriented to person, place, and time.     GCS: GCS eye subscore is 4. GCS  verbal subscore is 5. GCS motor subscore is 6.     Cranial Nerves: Cranial nerves 2-12 are intact.     Sensory: Sensation is intact.     Motor: Motor function is intact. No weakness or abnormal muscle tone.     Coordination: Coordination is intact.  Psychiatric:        Mood and Affect: Mood normal.        Speech: Speech normal.        Behavior: Behavior normal.     ED Results / Procedures / Treatments   Labs (all labs ordered are listed, but only abnormal results are displayed) Labs Reviewed  BASIC METABOLIC PANEL - Abnormal; Notable for the following components:      Result Value   Potassium 3.3 (*)    Creatinine, Ser 1.39 (*)    GFR, Estimated 53 (*)    All other components within normal limits  CBC  TROPONIN I (HIGH SENSITIVITY)  TROPONIN I (HIGH SENSITIVITY)    EKG EKG Interpretation  Date/Time:  Monday Aug 25 2022 23:24:26 EDT Ventricular Rate:  61 PR Interval:  120 QRS Duration: 92 QT Interval:  422 QTC Calculation: 424 R Axis:   53 Text Interpretation: Sinus rhythm with Premature atrial complexes Inferior infarct , age undetermined Anterolateral infarct , age undetermined Abnormal ECG When compared with ECG of 01-Jul-2022 00:18, no change Confirmed by Gilda Crease [phone]) on 08/25/2022 11:28:44 PM  Radiology DG Chest 2 View  Result Date: 08/26/2022 CLINICAL DATA:  Right-sided chest pain EXAM: CHEST - 2 VIEW COMPARISON:  Chest radiograph 01/10/2022 FINDINGS: Sternotomy and CABG. Stable cardiomediastinal silhouette. Aortic atherosclerotic calcification. Chronic bronchitic changes. No focal consolidation, pleural effusion, or pneumothorax. No displaced rib fractures. IMPRESSION: No active cardiopulmonary disease.  Emphysema. Electronically Signed   By: Minerva Fester M.D.   On: 08/26/2022 00:07    Procedures Procedures    Medications Ordered in ED Medications - No data to display  ED Course/ Medical Decision Making/ A&P                              Medical Decision Making Amount and/or Complexity of Data Reviewed External Data Reviewed: ECG. Labs: ordered. Decision-making details documented in ED Course. Radiology: ordered and independent interpretation performed. Decision-making details documented in ED Course. ECG/medicine tests: ordered and independent interpretation performed. Decision-making details documented in ED Course.  Differential Diagnosis considered includes, but not limited to: STEMI; NSTEMI; myocarditis; pericarditis; pulmonary embolism; aortic dissection; pneumothorax; pneumonia; gastritis; musculoskeletal pain  Presents for evaluation of chest pain.  Patient's pain is atypical in nature, right-sided, only last for a couple of minutes and then resolves.  Not related to exertion but does seem to be related to movement of his right arm.  He is experiencing some pain in the right shoulder as well that appears musculoskeletal, reproducible with movement.  No known injury.  Workup has been reassuring.  No concern for cardiac etiology of his pain, refer back to primary care for further evaluation of his right shoulder pain.        Final Clinical Impression(s) / ED Diagnoses Final diagnoses:  Shoulder tendinitis, right  Atypical chest pain    Rx / DC Orders ED Discharge Orders     None         Langston Summerfield, Canary Brim, MD 08/26/22 [phone]

## 2022-08-28 ENCOUNTER — Ambulatory Visit: Payer: 59 | Attending: Internal Medicine | Admitting: *Deleted

## 2022-08-28 DIAGNOSIS — Z86711 Personal history of pulmonary embolism: Secondary | ICD-10-CM | POA: Diagnosis not present

## 2022-08-28 DIAGNOSIS — Z5181 Encounter for therapeutic drug level monitoring: Secondary | ICD-10-CM

## 2022-08-28 DIAGNOSIS — I513 Intracardiac thrombosis, not elsewhere classified: Secondary | ICD-10-CM | POA: Diagnosis not present

## 2022-08-28 LAB — POCT INR: INR: 2.2 (ref 2.0–3.0)

## 2022-08-28 NOTE — Patient Instructions (Signed)
Continue warfarin 1/2 a tablet daily except for 1 tablet on Sunday. Recheck INR in 4 weeks.  

## 2022-09-09 ENCOUNTER — Inpatient Hospital Stay: Payer: 59 | Attending: Physician Assistant

## 2022-09-09 DIAGNOSIS — I2510 Atherosclerotic heart disease of native coronary artery without angina pectoris: Secondary | ICD-10-CM | POA: Diagnosis not present

## 2022-09-09 DIAGNOSIS — I513 Intracardiac thrombosis, not elsewhere classified: Secondary | ICD-10-CM

## 2022-09-09 DIAGNOSIS — Z86711 Personal history of pulmonary embolism: Secondary | ICD-10-CM | POA: Diagnosis not present

## 2022-09-09 DIAGNOSIS — Z823 Family history of stroke: Secondary | ICD-10-CM | POA: Diagnosis not present

## 2022-09-09 DIAGNOSIS — R0602 Shortness of breath: Secondary | ICD-10-CM | POA: Insufficient documentation

## 2022-09-09 DIAGNOSIS — Z888 Allergy status to other drugs, medicaments and biological substances status: Secondary | ICD-10-CM | POA: Insufficient documentation

## 2022-09-09 DIAGNOSIS — Z809 Family history of malignant neoplasm, unspecified: Secondary | ICD-10-CM | POA: Insufficient documentation

## 2022-09-09 DIAGNOSIS — E669 Obesity, unspecified: Secondary | ICD-10-CM | POA: Insufficient documentation

## 2022-09-09 DIAGNOSIS — Z7902 Long term (current) use of antithrombotics/antiplatelets: Secondary | ICD-10-CM | POA: Insufficient documentation

## 2022-09-09 DIAGNOSIS — Z87891 Personal history of nicotine dependence: Secondary | ICD-10-CM | POA: Diagnosis not present

## 2022-09-09 DIAGNOSIS — Z8673 Personal history of transient ischemic attack (TIA), and cerebral infarction without residual deficits: Secondary | ICD-10-CM | POA: Insufficient documentation

## 2022-09-09 DIAGNOSIS — Z79899 Other long term (current) drug therapy: Secondary | ICD-10-CM | POA: Diagnosis not present

## 2022-09-09 DIAGNOSIS — G479 Sleep disorder, unspecified: Secondary | ICD-10-CM | POA: Diagnosis not present

## 2022-09-09 DIAGNOSIS — Z7901 Long term (current) use of anticoagulants: Secondary | ICD-10-CM | POA: Diagnosis not present

## 2022-09-09 DIAGNOSIS — M2550 Pain in unspecified joint: Secondary | ICD-10-CM | POA: Diagnosis not present

## 2022-09-09 DIAGNOSIS — I2699 Other pulmonary embolism without acute cor pulmonale: Secondary | ICD-10-CM

## 2022-09-09 DIAGNOSIS — I2694 Multiple subsegmental pulmonary emboli without acute cor pulmonale: Secondary | ICD-10-CM

## 2022-09-09 DIAGNOSIS — R5383 Other fatigue: Secondary | ICD-10-CM | POA: Diagnosis not present

## 2022-09-09 LAB — CBC WITH DIFFERENTIAL/PLATELET
Abs Immature Granulocytes: 0 10*3/uL (ref 0.00–0.07)
Basophils Absolute: 0 10*3/uL (ref 0.0–0.1)
Basophils Relative: 1 %
Eosinophils Absolute: 0.2 10*3/uL (ref 0.0–0.5)
Eosinophils Relative: 3 %
HCT: 51.8 % (ref 39.0–52.0)
Hemoglobin: 16.7 g/dL (ref 13.0–17.0)
Immature Granulocytes: 0 %
Lymphocytes Relative: 55 %
Lymphs Abs: 2.8 10*3/uL (ref 0.7–4.0)
MCH: 28.9 pg (ref 26.0–34.0)
MCHC: 32.2 g/dL (ref 30.0–36.0)
MCV: 89.8 fL (ref 80.0–100.0)
Monocytes Absolute: 0.7 10*3/uL (ref 0.1–1.0)
Monocytes Relative: 15 %
Neutro Abs: 1.3 10*3/uL — ABNORMAL LOW (ref 1.7–7.7)
Neutrophils Relative %: 26 %
Platelets: 144 10*3/uL — ABNORMAL LOW (ref 150–400)
RBC: 5.77 MIL/uL (ref 4.22–5.81)
RDW: 13.2 % (ref 11.5–15.5)
WBC: 5 10*3/uL (ref 4.0–10.5)
nRBC: 0 % (ref 0.0–0.2)

## 2022-09-09 LAB — COMPREHENSIVE METABOLIC PANEL
ALT: 50 U/L — ABNORMAL HIGH (ref 0–44)
AST: 32 U/L (ref 15–41)
Albumin: 4.2 g/dL (ref 3.5–5.0)
Alkaline Phosphatase: 52 U/L (ref 38–126)
Anion gap: 8 (ref 5–15)
BUN: 18 mg/dL (ref 8–23)
CO2: 26 mmol/L (ref 22–32)
Calcium: 8.7 mg/dL — ABNORMAL LOW (ref 8.9–10.3)
Chloride: 101 mmol/L (ref 98–111)
Creatinine, Ser: 1.53 mg/dL — ABNORMAL HIGH (ref 0.61–1.24)
GFR, Estimated: 47 mL/min — ABNORMAL LOW (ref >60)
Glucose, Bld: 75 mg/dL (ref 70–99)
Potassium: 3.5 mmol/L (ref 3.5–5.1)
Sodium: 135 mmol/L (ref 135–145)
Total Bilirubin: 2.1 mg/dL — ABNORMAL HIGH (ref 0.3–1.2)
Total Protein: 7.9 g/dL (ref 6.5–8.1)

## 2022-09-09 LAB — IRON AND TIBC
Iron: 112 ug/dL (ref 45–182)
Saturation Ratios: 29 % (ref 17.9–39.5)
TIBC: 381 ug/dL (ref 250–450)
UIBC: 269 ug/dL

## 2022-09-09 LAB — D-DIMER, QUANTITATIVE: D-Dimer, Quant: 0.47 ug/mL-FEU (ref 0.00–0.50)

## 2022-09-09 LAB — FERRITIN: Ferritin: 128 ng/mL (ref 24–336)

## 2022-09-10 LAB — BETA-2-GLYCOPROTEIN I ABS, IGG/M/A
Beta-2 Glyco I IgG: <9 GPI IgG units (ref 0–20)
Beta-2-Glycoprotein I IgA: <9 GPI IgA units (ref 0–25)
Beta-2-Glycoprotein I IgM: <9 GPI IgM units (ref 0–32)

## 2022-09-10 LAB — LUPUS ANTICOAGULANT PANEL
DRVVT: 64 s — ABNORMAL HIGH (ref 0.0–47.0)
PTT Lupus Anticoagulant: 53.1 s — ABNORMAL HIGH (ref 0.0–43.5)

## 2022-09-10 LAB — HEXAGONAL PHASE PHOSPHOLIPID: Hexagonal Phase Phospholipid: 2 s (ref 0–11)

## 2022-09-10 LAB — PTT-LA MIX: PTT-LA Mix: 45.2 s — ABNORMAL HIGH (ref 0.0–40.5)

## 2022-09-10 LAB — DRVVT MIX: dRVVT Mix: 39.2 s (ref 0.0–40.4)

## 2022-09-11 LAB — CARDIOLIPIN ANTIBODIES, IGG, IGM, IGA
Anticardiolipin IgA: <9 APL U/mL (ref 0–11)
Anticardiolipin IgG: <9 GPL U/mL (ref 0–14)
Anticardiolipin IgM: <9 MPL U/mL (ref 0–12)

## 2022-09-13 ENCOUNTER — Other Ambulatory Visit: Payer: Self-pay

## 2022-09-13 ENCOUNTER — Emergency Department (HOSPITAL_COMMUNITY): Payer: 59

## 2022-09-13 ENCOUNTER — Encounter (HOSPITAL_COMMUNITY): Payer: Self-pay | Admitting: Emergency Medicine

## 2022-09-13 ENCOUNTER — Emergency Department (HOSPITAL_COMMUNITY)
Admission: EM | Admit: 2022-09-13 | Discharge: 2022-09-13 | Disposition: A | Discharge status: Home / Self Care | Payer: 59 | Attending: Emergency Medicine | Admitting: Emergency Medicine

## 2022-09-13 DIAGNOSIS — R079 Chest pain, unspecified: Secondary | ICD-10-CM | POA: Diagnosis present

## 2022-09-13 DIAGNOSIS — E876 Hypokalemia: Secondary | ICD-10-CM | POA: Diagnosis not present

## 2022-09-13 DIAGNOSIS — M79601 Pain in right arm: Secondary | ICD-10-CM | POA: Diagnosis not present

## 2022-09-13 DIAGNOSIS — R0789 Other chest pain: Secondary | ICD-10-CM | POA: Diagnosis not present

## 2022-09-13 LAB — CBC WITH DIFFERENTIAL/PLATELET
Abs Immature Granulocytes: 0 10*3/uL (ref 0.00–0.07)
Basophils Absolute: 0 10*3/uL (ref 0.0–0.1)
Basophils Relative: 1 %
Eosinophils Absolute: 0.2 10*3/uL (ref 0.0–0.5)
Eosinophils Relative: 3 %
HCT: 47.7 % (ref 39.0–52.0)
Hemoglobin: 15.3 g/dL (ref 13.0–17.0)
Immature Granulocytes: 0 %
Lymphocytes Relative: 41 %
Lymphs Abs: 2 10*3/uL (ref 0.7–4.0)
MCH: 28.5 pg (ref 26.0–34.0)
MCHC: 32.1 g/dL (ref 30.0–36.0)
MCV: 89 fL (ref 80.0–100.0)
Monocytes Absolute: 0.5 10*3/uL (ref 0.1–1.0)
Monocytes Relative: 10 %
Neutro Abs: 2.2 10*3/uL (ref 1.7–7.7)
Neutrophils Relative %: 45 %
Platelets: 154 10*3/uL (ref 150–400)
RBC: 5.36 MIL/uL (ref 4.22–5.81)
RDW: 12.9 % (ref 11.5–15.5)
WBC: 4.9 10*3/uL (ref 4.0–10.5)
nRBC: 0 % (ref 0.0–0.2)

## 2022-09-13 LAB — COMPREHENSIVE METABOLIC PANEL
ALT: 43 U/L (ref 0–44)
AST: 28 U/L (ref 15–41)
Albumin: 3.8 g/dL (ref 3.5–5.0)
Alkaline Phosphatase: 60 U/L (ref 38–126)
Anion gap: 7 (ref 5–15)
BUN: 15 mg/dL (ref 8–23)
CO2: 25 mmol/L (ref 22–32)
Calcium: 8.6 mg/dL — ABNORMAL LOW (ref 8.9–10.3)
Chloride: 104 mmol/L (ref 98–111)
Creatinine, Ser: 1.35 mg/dL — ABNORMAL HIGH (ref 0.61–1.24)
GFR, Estimated: 54 mL/min — ABNORMAL LOW (ref >60)
Glucose, Bld: 139 mg/dL — ABNORMAL HIGH (ref 70–99)
Potassium: 3.4 mmol/L — ABNORMAL LOW (ref 3.5–5.1)
Sodium: 136 mmol/L (ref 135–145)
Total Bilirubin: 2.2 mg/dL — ABNORMAL HIGH (ref 0.3–1.2)
Total Protein: 7.1 g/dL (ref 6.5–8.1)

## 2022-09-13 LAB — TROPONIN I (HIGH SENSITIVITY): Troponin I (High Sensitivity): 8 ng/L (ref <18)

## 2022-09-13 MED ORDER — METHOCARBAMOL 500 MG PO TABS: 500.0000 mg | ORAL_TABLET | Freq: Two times a day (BID) | ORAL | 0 refills | Status: DC | PRN

## 2022-09-13 MED ORDER — ASPIRIN 81 MG PO CHEW
324.0000 mg | CHEWABLE_TABLET | Freq: Once | ORAL | Status: AC
  Administered 2022-09-13: 324 mg via ORAL
  Filled 2022-09-13: qty 4

## 2022-09-13 MED ORDER — NITROGLYCERIN 0.4 MG SL SUBL
0.4000 mg | SUBLINGUAL_TABLET | Freq: Once | SUBLINGUAL | Status: AC
  Administered 2022-09-13: 0.4 mg via SUBLINGUAL
  Filled 2022-09-13: qty 1

## 2022-09-13 NOTE — Discharge Instructions (Addendum)
Thankfully all of your testing was normal And did not show any signs of shoulder problems or heart problems, the blood work was normal and the EKG was reassuring.  I do not think you are having a heart attack however if you are developing severe or worsening chest pain come back to the ER immediately.  You should have your family doctor recheck you within 3 days  The x-ray of your arm is okay, there is no signs of significant arthritis or fracture of the shoulder.  I suspect that you have a muscular injury to the shoulder which is why it hurts when you do certain movements.  You may take Robaxin twice a day as needed as a muscle relaxer but do not drive a car if you are taking this medication.  Thank you for allowing [place] to treat you in the emergency department today.  After reviewing your examination and potential testing that was done it appears that you are safe to go home.  I would like for you to follow-up with your doctor within the next several days, have them obtain your results and follow-up with them to review all of these tests.  If you should develop severe or worsening symptoms return to the emergency department immediately

## 2022-09-13 NOTE — ED Triage Notes (Signed)
Pt c/o of right sided chest pain and right arm. Pt reports taking Rolaids. Pt denies injury or unusual activity.

## 2022-09-13 NOTE — ED Provider Notes (Signed)
[place] EMERGENCY DEPARTMENT AT [facility] Provider Note   CSN: [id number] Arrival date & time: 09/13/22  0865     History  Chief Complaint  Patient presents with   Chest Pain   Numbness    Right arm    [name] is a 77 y.o. male.   Chest Pain This patient is a 72 -year-old male, he has a history of coronary disease status post CABG, hx of stents - hx of 3 months of constnat shoulder pain.  This is not but yet been worked up to any degree.  The chest discomfort to started this morning when he woke up and is right-sided, there is no shortness of breath nausea or diaphoresis.  No swelling of the legs.  The patient does not seem to be concerned about the chest pain but is far more worried about his arm pain.    Home Medications Prior to Admission medications   Medication Sig Start Date End Date Taking? Authorizing Provider  methocarbamol (ROBAXIN) 500 MG tablet Take 1 tablet (500 mg total) by mouth 2 (two) times daily as needed for muscle spasms. 09/13/22  Yes Eber Hong, MD  atorvastatin (LIPITOR) 40 MG tablet Take 1 tablet (40 mg total) by mouth daily at 6 PM. 03/09/17   Calvert Cantor, MD  clopidogrel (PLAVIX) 75 MG tablet Take 75 mg by mouth daily.    [provider]  enoxaparin (LOVENOX) 100 MG/ML injection Inject 0.95 mLs (95 mg total) into the skin every 12 (twelve) hours. 07/23/22   Carnella Guadalajara, PA-C  furosemide (LASIX) 20 MG tablet TAKE 3 TABLETS BY MOUTH EVERY DAY 10/02/20   Pricilla Riffle, MD  hydrALAZINE (APRESOLINE) 50 MG tablet Take 1 tablet (50 mg total) by mouth 3 (three) times daily. 10/15/21 10/10/22  Pricilla Riffle, MD  isosorbide mononitrate (IMDUR) 60 MG 24 hr tablet Take 1 tablet (60 mg total) by mouth daily. 09/12/20 06/29/22  Pricilla Riffle, MD  JARDIANCE 10 MG TABS tablet TAKE 1 TABLET BY MOUTH DAILY BEFORE BREAKFAST. 03/28/22   Pricilla Riffle, MD  metoprolol succinate (TOPROL-XL) 50 MG 24 hr tablet Take 50 mg by mouth daily. 04/27/17    [provider]  nitroGLYCERIN (NITROSTAT) 0.4 MG SL tablet Place 1 tablet (0.4 mg total) under the tongue every 5 (five) minutes as needed for chest pain. up to 3 doses. 07/05/13   Jodelle Gross, NP  spironolactone (ALDACTONE) 25 MG tablet TAKE 1 TABLET (25 MG TOTAL) BY MOUTH DAILY. 08/15/22 08/10/23  Ellsworth Lennox, PA-C      Allergies    Lisinopril    Review of Systems   Review of Systems  Cardiovascular:  Positive for chest pain.  All other systems reviewed and are negative.   Physical Exam Updated Vital Signs BP 123/83   Pulse (!) 52   Temp 97.6 F (36.4 C) (Oral)   Resp 16   Ht 1.676 m (5\' 6" )   Wt 94 kg   SpO2 94%   BMI 33.45 kg/m  Physical Exam Vitals and nursing note reviewed.  Constitutional:      General: He is not in acute distress.    Appearance: He is well-developed.  HENT:     Head: Normocephalic and atraumatic.     Mouth/Throat:     Pharynx: No oropharyngeal exudate.  Eyes:     General: No scleral icterus.       Right eye: No discharge.  Left eye: No discharge.     Conjunctiva/sclera: Conjunctivae normal.     Pupils: Pupils are equal, round, and reactive to light.  Neck:     Thyroid: No thyromegaly.     Vascular: No JVD.  Cardiovascular:     Rate and Rhythm: Normal rate and regular rhythm.     Heart sounds: Normal heart sounds. No murmur heard.    No friction rub. No gallop.  Pulmonary:     Effort: Pulmonary effort is normal. No respiratory distress.     Breath sounds: Normal breath sounds. No wheezing or rales.  Abdominal:     General: Bowel sounds are normal. There is no distension.     Palpations: Abdomen is soft. There is no mass.     Tenderness: There is no abdominal tenderness.  Musculoskeletal:        General: Tenderness present. Normal range of motion.     Cervical back: Normal range of motion and neck supple.     Comments: Mild tenderness with range of motion of the right shoulder but good range of motion,  normal contour of the shoulder, normal range of motion of the elbow and the wrist.  Normal pulses of the right upper extremity  Lymphadenopathy:     Cervical: No cervical adenopathy.  Skin:    General: Skin is warm and dry.     Findings: No erythema or rash.  Neurological:     Mental Status: He is alert.     Coordination: Coordination normal.  Psychiatric:        Behavior: Behavior normal.     ED Results / Procedures / Treatments   Labs (all labs ordered are listed, but only abnormal results are displayed) Labs Reviewed  COMPREHENSIVE METABOLIC PANEL - Abnormal; Notable for the following components:      Result Value   Potassium 3.4 (*)    Glucose, Bld 139 (*)    Creatinine, Ser 1.35 (*)    Calcium 8.6 (*)    Total Bilirubin 2.2 (*)    GFR, Estimated 54 (*)    All other components within normal limits  CBC WITH DIFFERENTIAL/PLATELET  TROPONIN I (HIGH SENSITIVITY)    EKG EKG Interpretation  Date/Time:  Saturday September 13 2022 09:09:39 EDT Ventricular Rate:  56 PR Interval:  132 QRS Duration: 104 QT Interval:  451 QTC Calculation: 436 R Axis:   46 Text Interpretation: Sinus rhythm Atrial premature complex Inferior infarct, old Anterolateral infarct, age indeterminate since last tracing no significant change Confirmed by Eber Hong ([id number]) on 09/13/2022 9:49:33 AM  Radiology DG Chest Port 1 View  Result Date: 09/13/2022 CLINICAL DATA:  Right chest and arm pain EXAM: RIGHT SHOULDER - 2+ VIEW; PORTABLE CHEST - 1 VIEW COMPARISON:  Chest radiograph 08/25/2022 FINDINGS: Right shoulder: Moderate degenerative changes of the right acromioclavicular joint. Irregularity of the greater tuberosity indicative of rotator cuff tendinopathy. No fracture or dislocation. Chest: Heart is mildly enlarged. No pulmonary vascular congestion. Hazy opacities at the left lung base is favored to be atelectasis. Postsurgical changes of CABG again seen. IMPRESSION: 1. Mild unchanged cardiomegaly. Left  basilar opacity likely due to atelectasis. 2. No acute abnormality of the right shoulder. Electronically Signed   By: Acquanetta Belling M.D.   On: 09/13/2022 10:58   DG Shoulder Right  Result Date: 09/13/2022 CLINICAL DATA:  Right chest and arm pain EXAM: RIGHT SHOULDER - 2+ VIEW; PORTABLE CHEST - 1 VIEW COMPARISON:  Chest radiograph 08/25/2022 FINDINGS: Right shoulder: Moderate degenerative changes  of the right acromioclavicular joint. Irregularity of the greater tuberosity indicative of rotator cuff tendinopathy. No fracture or dislocation. Chest: Heart is mildly enlarged. No pulmonary vascular congestion. Hazy opacities at the left lung base is favored to be atelectasis. Postsurgical changes of CABG again seen. IMPRESSION: 1. Mild unchanged cardiomegaly. Left basilar opacity likely due to atelectasis. 2. No acute abnormality of the right shoulder. Electronically Signed   By: Acquanetta Belling M.D.   On: 09/13/2022 10:58    Procedures Procedures    Medications Ordered in ED Medications  aspirin chewable tablet 324 mg (324 mg Oral Given 09/13/22 0949)  nitroGLYCERIN (NITROSTAT) SL tablet 0.4 mg (0.4 mg Sublingual Given 09/13/22 4098)    ED Course/ Medical Decision Making/ A&P                             Medical Decision Making Amount and/or Complexity of Data Reviewed Labs: ordered. Radiology: ordered.  Risk OTC drugs. Prescription drug management.    This patient presents to the ED for concern of this pain and right shoulder pain, this involves an extensive number of treatment options, and is a complaint that carries with it a high risk of complications and morbidity.  The differential diagnosis includes chronic right shoulder pain yields itself to a more musculoskeletal diagnosis.  The patient has normal vital signs and an EKG which is essentially unchanged, will need to repeat EKG and troponins to rule out acute coronary syndrome or other cause.  Will also get chest x-ray to rule out pneumonia or  pneumothorax   Co morbidities that complicate the patient evaluation  Known coronary disease   Additional history obtained:  Additional history obtained from medical record External records from outside source obtained and reviewed including multiple prior EKGs and evaluations in the hospital   Lab Tests:  I Ordered, and personally interpreted labs.  The pertinent results include: Negative troponin twice, labs unremarkable otherwise   Imaging Studies ordered:  I ordered imaging studies including x-ray and shoulder x-rays of the chest I independently visualized and interpreted imaging which showed no acute findings other than some cardiomegaly I agree with the radiologist interpretation   Cardiac Monitoring: / EKG:  The patient was maintained on a cardiac monitor.  I personally viewed and interpreted the cardiac monitored which showed an underlying rhythm of: Normal sinus rhythm, slight bradycardia in the 50s, pressure is normal at 123/83   Problem List / ED Course / Critical interventions / Medication management  Home with medications including an anti-inflammatory which she can take over-the-counter and a muscle relaxer, ultimately the patient was evaluated for causes of his chest discomfort.  On reexam he is completely chest pain-free and has no symptoms in either his arm or his chest, the arm is very reproducible with certain movements, the chest is not reproducible and not present at this time.  It was nonexertional this morning.  He has no shortness of breath, no signs of elevation of troponin, he is stable for discharge to follow-up outpatient and understands indications for return I have reviewed the patients home medicines and have made adjustments as needed   Social Determinants of Health:  Non-smoker   Test / Admission - Considered:  I have discussed with the patient at the bedside the results, and the meaning of these results.  They have expressed her  understanding to the need for follow-up with primary care physician  Final Clinical Impression(s) / ED Diagnoses Final diagnoses:  Right-sided chest pain  Right arm pain    Rx / DC Orders ED Discharge Orders          Ordered    methocarbamol (ROBAXIN) 500 MG tablet  2 times daily PRN        09/13/22 1200              Eber Hong, MD 09/13/22 1202

## 2022-09-13 NOTE — ED Notes (Signed)
Pt escorted in wheelchair to discharge vehicle driven by the pts daughter. Daughter and pt verbalized understanding and no further questions at the time of discharge. Pt was alert and oriented. Pts gait was even, steady, and at baseline.

## 2022-09-18 ENCOUNTER — Encounter: Payer: Self-pay | Admitting: Neurology

## 2022-09-18 ENCOUNTER — Ambulatory Visit (INDEPENDENT_AMBULATORY_CARE_PROVIDER_SITE_OTHER): Payer: 59 | Admitting: Neurology

## 2022-09-18 VITALS — BP 125/76 | HR 70 | Ht 66.0 in | Wt 207.0 lb

## 2022-09-18 DIAGNOSIS — G459 Transient cerebral ischemic attack, unspecified: Secondary | ICD-10-CM | POA: Diagnosis not present

## 2022-09-18 DIAGNOSIS — D6859 Other primary thrombophilia: Secondary | ICD-10-CM

## 2022-09-18 DIAGNOSIS — I639 Cerebral infarction, unspecified: Secondary | ICD-10-CM | POA: Diagnosis not present

## 2022-09-18 DIAGNOSIS — H53461 Homonymous bilateral field defects, right side: Secondary | ICD-10-CM

## 2022-09-18 NOTE — Patient Instructions (Addendum)
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 I also advised the patient to eat a healthy diet with plenty of whole grains, cereals, fruits and vegetables, exercise regularly and maintain ideal body weight .patient advised to schedule follow-up appointment with hematology for his hypercoagulable state.   followup in the future with me only as needed Stroke Prevention Some medical conditions and behaviors can lead to a higher chance of having a stroke. You can help prevent a stroke by eating healthy, exercising, not smoking, and managing any medical conditions you have. Stroke is a leading cause of functional impairment. Primary prevention is particularly important because a majority of strokes are first-time events. Stroke changes the lives of not only those who experience a stroke but also their family and other caregivers. How can this condition affect me? A stroke is a medical emergency and should be treated right away. A stroke can lead to brain damage and can sometimes be life-threatening. If a person gets medical treatment right away, there is a better chance of surviving and recovering from a stroke. What can increase my risk? The following medical conditions may increase your risk of a stroke: Cardiovascular disease. High blood pressure (hypertension). Diabetes. High cholesterol. Sickle cell disease. Blood clotting disorders (hypercoagulable  state). Obesity. Sleep disorders (obstructive sleep apnea). Other risk factors include: Being older than age 70. Having a history of blood clots, stroke, or mini-stroke (transient ischemic attack, TIA). Genetic factors, such as race, ethnicity, or a family history of stroke. Smoking cigarettes or using other tobacco products. Taking birth control pills, especially if you also use tobacco. Heavy use of alcohol or drugs, especially cocaine and methamphetamine. Physical inactivity. What actions can I take to prevent this? Manage your health conditions High cholesterol levels. Eating a healthy diet is important for preventing high cholesterol. If cholesterol cannot be managed through diet alone, you may need to take medicines. Take any prescribed medicines to control your cholesterol as told by your health care provider. Hypertension. To reduce your risk of stroke, try to keep your blood pressure below 130/80. Eating a healthy diet and exercising regularly are important for controlling blood pressure. If these steps are not enough to manage your blood pressure, you may need to take medicines. Take any prescribed medicines to control hypertension as told by your health care provider. Ask your health care provider if you should monitor your blood pressure at home. Have your blood pressure checked every year, even if your blood pressure is normal. Blood pressure increases with age and some medical conditions. Diabetes. Eating a healthy diet and exercising regularly are important parts of managing your blood sugar (glucose). If your blood sugar cannot be managed through diet and exercise, you may need to take medicines. Take any prescribed medicines to control your diabetes as told by your health care provider. Get evaluated for obstructive sleep apnea. Talk to your health care provider about getting a sleep evaluation if you snore a lot or have excessive sleepiness. Make sure that any other  medical conditions you have, such as  atrial fibrillation or atherosclerosis, are managed. Nutrition Follow instructions from your health care provider about what to eat or drink to help manage your health condition. These instructions may include: Reducing your daily calorie intake. Limiting how much salt (sodium) you use to 1,500 milligrams (mg) each day. Using only healthy fats for cooking, such as olive oil, canola oil, or sunflower oil. Eating healthy foods. You can do this by: Choosing foods that are high in fiber, such as whole grains, and fresh fruits and vegetables. Eating at least 5 servings of fruits and vegetables a day. Try to fill one-half of your plate with fruits and vegetables at each meal. Choosing lean protein foods, such as lean cuts of meat, poultry without skin, fish, tofu, beans, and nuts. Eating low-fat dairy products. Avoiding foods that are high in sodium. This can help lower blood pressure. Avoiding foods that have saturated fat, trans fat, and cholesterol. This can help prevent high cholesterol. Avoiding processed and prepared foods. Counting your daily carbohydrate intake.  Lifestyle If you drink alcohol: Limit how much you have to: 0-1 drink a day for women who are not pregnant. 0-2 drinks a day for men. Know how much alcohol is in your drink. In the [place] one drink equals one 12 oz bottle of beer ( ), one 5 oz glass of wine ( ), or one 1 oz glass of hard liquor (44mL). Do not use any products that contain nicotine or tobacco. These products include cigarettes, chewing tobacco, and vaping devices, such as e-cigarettes. If you need help quitting, ask your health care provider. Avoid secondhand smoke. Do not use drugs. Activity  Try to stay at a healthy weight. Get at least 30 minutes of exercise on most days, such as: Fast walking. Biking. Swimming. Medicines Take over-the-counter and prescription medicines only as told by your health care  provider. Aspirin or blood thinners (antiplatelets or anticoagulants) may be recommended to reduce your risk of forming blood clots that can lead to stroke. Avoid taking birth control pills. Talk to your health care provider about the risks of taking birth control pills if: You are over 68 years old. You smoke. You get very bad headaches. You have had a blood clot. Where to find more information American Stroke Association: www.strokeassociation.org Get help right away if: You or a loved one has any symptoms of a stroke. "BE FAST" is an easy way to remember the main warning signs of a stroke: B - Balance. Signs are dizziness, sudden trouble walking, or loss of balance. E - Eyes. Signs are trouble seeing or a sudden change in vision. F - Face. Signs are sudden weakness or numbness of the face, or the face or eyelid drooping on one side. A - Arms. Signs are weakness or numbness in an arm. This happens suddenly and usually on one side of the body. S - Speech. Signs are sudden trouble speaking, slurred speech, or trouble understanding what people say. T - Time. Time to call emergency services. Write down what time symptoms started. You or a loved one has other signs of a stroke, such as: A sudden, severe headache with no known cause. Nausea or vomiting. Seizure. These symptoms may represent a serious problem that is an emergency. Do not wait to see if the symptoms will go away. Get medical help right away. Call your local emergency services (911 in the [place] Do not drive yourself to the hospital. Summary You can help to prevent a stroke by eating healthy, exercising, not smoking,  limiting alcohol intake, and managing any medical conditions you may have. Do not use any products that contain nicotine or tobacco. These include cigarettes, chewing tobacco, and vaping devices, such as e-cigarettes. If you need help quitting, ask your health care provider. Remember "BE FAST" for warning signs of a  stroke. Get help right away if you or a loved one has any of these signs. This information is not intended to replace advice given to you by your health care provider. Make sure you discuss any questions you have with your health care provider. Document Revised: 02/24/2022 Document Reviewed: 02/24/2022 Elsevier Patient Education  2024 [employer].

## 2022-09-18 NOTE — Progress Notes (Signed)
[facility] [address] [place]. [place] [address] [phone]       OFFICE CONSULT NOTE  [name] Date of Birth:  March 11, 1946 Medical Record Number:  [id number]   Referring MD: Dr. Melynda Ripple  Reason for Referral: Stroke  HPI: [name] is a 77 year old African-American male seen today for initial office consultation visit for TIA.  History is obtained from the patient and his daughter is accompanying him today and review of electronic medical records and I personally reviewed pertinent available imaging.  He has past medical history of hypertension, hyperlipidemia, coronary artery disease, pulmonary embolism.  She presented on 07/01/2022 with symptoms of left-sided numbness involving the face and arm.  She had similar symptoms a few days ago which was transient and has resolved.  Neurological exam was significant only for  right sided peripheral vision defect which was old from a previous stroke.  She had a prior history of cardiac thrombus and pulmonary embolism and was previously on Eliquis but switched to Coumadin in 2022.  Pulm embolism.  INR was 2.7.  She denies any other focal symptoms.  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  MRI of the brain showed no large vessel stenosis or occlusion.  Carotid ultrasound showed bilateral less than 50% ICA stenosis.  2D echo showed ejection fraction of 35 to 40% but there was presence of both 1 x 0.5 cm normal by left ventricular global clot.  LDL cholesterol was 1 mg percent and hemoglobin A1c 5.7.  Urine drug screen was negative.  Hypercoagulable panel labs were sent and are negative.  EEG was normal.  Hematology was consulted and patient was switched to Lovenox injections as she had failed previously Eliquis and now warfarin.  Patient however was unable to tolerate Lovenox injections due to developing knots in her skin and for the  last 1 month has switched back to warfarin.  She is tolerating it well without bruising or bleeding.  She has had no recurrent stroke or TIA symptoms.  ROS:   14 system review of systems is positive for shoulder pain, decreased vision, decreased hearing , skin knots all other systems negative  PMH:  Past Medical History:  Diagnosis Date   Apical mural thrombus    Arteriosclerotic cardiovascular disease (ASCVD)    a. s/p CABG in 2011 b. DES to RCA in 08/2017 c. cath in 06/2018 showing patent LIMA-LAD, SVG-OM1-OM2 and SVG-D1   Chronic anticoagulation    Chronic systolic CHF (congestive heart failure) (HCC)    a. EF 45-50% in 06/2012 b. 35-40% in 08/2017 with similar results in 06/2018 c. EF at 30-35% in 10/2020   CVA (cerebral vascular accident) (HCC)    HTN (hypertension)    Hyperlipidemia    Keloid    median sternotomy   Pulmonary embolism (HCC) 03/2010   Substance abuse (HCC)    formerly cocaine    Social History:  Social History   Socioeconomic History   Marital status: Widowed    Spouse name: Not on file   Number of children: Not on file   Years of education: Not on file   Highest education level: Not on file  Occupational History   Occupation: disability  Tobacco Use   Smoking status: Former    Types: Cigarettes    Quit date: 2004    Years since quitting: 20.4   Smokeless tobacco: Never  Vaping Use  Vaping Use: Never used  Substance and Sexual Activity   Alcohol use: Not Currently    Comment: rare   Drug use: No    Types: Cocaine    Comment: Former cocaine abuse - last use for 51th birthday   Sexual activity: Not Currently  Other Topics Concern   Not on file  Social History Narrative   Not on file   Social Determinants of Health   Financial Resource Strain: Low Risk  (10/30/2020)   Overall Financial Resource Strain (CARDIA)    Difficulty of Paying Living Expenses: Not very hard  Food Insecurity: No Food Insecurity (07/01/2022)   Hunger Vital Sign     Worried About Running Out of Food in the Last Year: Never true    Ran Out of Food in the Last Year: Never true  Transportation Needs: No Transportation Needs (07/01/2022)   PRAPARE - Administrator, Civil Service (Medical): No    Lack of Transportation (Non-Medical): No  Physical Activity: Insufficiently Active (10/30/2020)   Exercise Vital Sign    Days of Exercise per Week: 2 days    Minutes of Exercise per Session: 20 min  Stress: No Stress Concern Present (10/30/2020)   [employer] of Occupational Health - Occupational Stress Questionnaire    Feeling of Stress : Only a little  Social Connections: Moderately Isolated (10/30/2020)   Social Connection and Isolation Panel [NHANES]    Frequency of Communication with Friends and Family: Three times a week    Frequency of Social Gatherings with Friends and Family: Twice a week    Attends Religious Services: 1 to 4 times per year    Active Member of [employer] or Organizations: No    Attends Banker Meetings: Never    Marital Status: Widowed  Intimate Partner Violence: Not At Risk (07/01/2022)   Humiliation, Afraid, Rape, and Kick questionnaire    Fear of Current or Ex-Partner: No    Emotionally Abused: No    Physically Abused: No    Sexually Abused: No    Medications:   Current Outpatient Medications on File Prior to Visit  Medication Sig Dispense Refill   atorvastatin (LIPITOR) 40 MG tablet Take 1 tablet (40 mg total) by mouth daily at 6 PM. 30 tablet 0   clopidogrel (PLAVIX) 75 MG tablet Take 75 mg by mouth daily.     furosemide (LASIX) 20 MG tablet TAKE 3 TABLETS BY MOUTH EVERY DAY 270 tablet 3   hydrALAZINE (APRESOLINE) 50 MG tablet Take 1 tablet (50 mg total) by mouth 3 (three) times daily. 270 tablet 3   JARDIANCE 10 MG TABS tablet TAKE 1 TABLET BY MOUTH DAILY BEFORE BREAKFAST. 90 tablet 3   methocarbamol (ROBAXIN) 500 MG tablet Take 1 tablet (500 mg total) by mouth 2 (two) times daily as needed for muscle  spasms. 20 tablet 0   metoprolol succinate (TOPROL-XL) 50 MG 24 hr tablet Take 50 mg by mouth daily.  3   nitroGLYCERIN (NITROSTAT) 0.4 MG SL tablet Place 1 tablet (0.4 mg total) under the tongue every 5 (five) minutes as needed for chest pain. up to 3 doses. 25 tablet 4   spironolactone (ALDACTONE) 25 MG tablet TAKE 1 TABLET (25 MG TOTAL) BY MOUTH DAILY. 90 tablet 3   warfarin (COUMADIN) 5 MG tablet Take 5 mg by mouth daily.     isosorbide mononitrate (IMDUR) 60 MG 24 hr tablet Take 1 tablet (60 mg total) by mouth daily. 90 tablet 3  No current facility-administered medications on file prior to visit.    Allergies:   Allergies  Allergen Reactions   Lisinopril Swelling and Other (See Comments)    Mouth and tongue swells    Physical Exam General: well developed, well nourished, elderly African-American male seated, in no evident distress Head: head normocephalic and atraumatic.   Neck: supple with no carotid or supraclavicular bruits Cardiovascular: regular rate and rhythm, no murmurs Musculoskeletal: no deformity.  Right shoulder elevation limited due to pain Skin:  no rash/petichiae Vascular:  Normal pulses all extremities  Neurologic Exam Mental Status: Awake and fully alert. Oriented to place and time. Recent and remote memory intact. Attention span, concentration and fund of knowledge appropriate. Mood and affect appropriate.  Cranial Nerves: Fundoscopic exam reveals sharp disc margins. Pupils equal, briskly reactive to light. Extraocular movements full without nystagmus. Visual fields show right homonymous hemianopsia to confrontation. Hearing diminished bilaterally. Facial sensation intact. Face, tongue, palate moves normally and symmetrically.  Motor: Normal bulk and tone. Normal strength in all tested extremity muscles.  Diminished fine finger movements on the right.  Orbits left or right upper extremity. Sensory.: intact to touch , pinprick , position and vibratory sensation.   Coordination: Rapid alternating movements normal in all extremities. Finger-to-nose and heel-to-shin performed accurately bilaterally. Gait and Station: Arises from chair without difficulty. Stance is normal. Gait demonstrates normal stride length and balance . Able to heel, toe and tandem walk without difficulty.  Reflexes: 1+ and symmetric. Toes downgoing.   NIHSS  2 Modified Rankin  2   ASSESSMENT: 77 year old African-American male with primary hypercoagulable disorder with recurrent pulmonary embolism, cardiac mural thrombi and silent strokes with episode of TIA in March 2024 who has failed eliquis and warfarin and was unable to tolerate Lovenoxinjections she is now back on warfarin.     PLAN:I had a long d/w patient and his daughter about his primary hypercoagulable disorder with history of pulmonary embolism, recurrent cardiac thrombus and silent cerebral strokes and TIA stroke, risk for recurrent stroke/TIAs, personally independently reviewed imaging studies and stroke evaluation results and answered questions.Continue warfarin daily  for secondary stroke prevention with target INR 2.5-3.5 range and maintain strict control of hypertension with blood pressure goal below 130/90, diabetes with hemoglobin A1c goal below 6.5% and lipids with LDL cholesterol goal below 70 mg/dL. I also advised the patient to eat a healthy diet with plenty of whole grains, cereals, fruits and vegetables, exercise regularly and maintain ideal body weight .patient advised to schedule follow-up appointment with hematology for his hypercoagulable state.   followup in the future with me only as needed.  Greater than 50% time during this 45-minute consultation visit was spent on counseling and coordination of care about his TIA and silent strokes and hypercoagulability and answering questions  Delia Heady, MD Note: This document was prepared with digital dictation and possible smart phrase technology. Any  transcriptional errors that result from this process are unintentional.

## 2022-09-22 LAB — PROTHROMBIN GENE MUTATION

## 2022-09-22 NOTE — Progress Notes (Unsigned)
[facility] [address], [place] [address]   CLINIC:  Medical Oncology/Hematology  PCP:  Benetta Spar, MD [address] [place] [address] [phone]   REASON FOR VISIT:  Follow-up for recurrent PE  PRIOR THERAPY: Eliquis  CURRENT THERAPY: Warfarin  INTERVAL HISTORY:   [name] 77 y.o. 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  He was last seen by Rojelio Brenner PA-C on 07/07/2022.    At his last visit (07/07/2022), he was seen for posthospital follow-up after being admitted inpatient (07/01/2022) and found to have TIA and new LV thrombus despite therapeutic INR 2.7, therefore referred back to hematology for additional workup.  Since he had evident failure of both Eliquis and warfarin, he was switched to therapeutic dose Lovenox injections.  Patient reports that he was initially compliant with Lovenox injections, but he stopped Lovenox and went back to taking his warfarin about a month ago due to concern regarding the "knots under his skin."  He denies any adverse bleeding effects.  He also remains on Plavix after his TIA.   He reports that his TIA symptoms resolved.  He has not had any interval DVT or PE.   He denies any epistaxis, melena, hematochezia, hematemesis, or hematuria.  He has baseline dyspnea on exertion but denies any chest pain, palpitations, leg swelling, or shortness of breath at rest.   He has 100% energy and 100% appetite. He endorses that he is maintaining a stable weight.  ASSESSMENT & PLAN:  1.  Prothrombotic state - recurrent PE (failed Eliquis) + recurrent LV thrombus (failed warfarin) + CVA - Initial pulmonary embolism on 03/30/2010 after CABG surgery - Patient had recurrent PE in July 2022 despite being on Eliquis at the time (for LV  mural thrombus) - CTA chest (10/18/2020) showed age-indeterminate pulmonary artery emboli involving segmental branches of right middle and lower lobe region - Lower extremity Dopplers negative for DVT - Due to failure of Eliquis treatment, patient was transitioned to Coumadin therapy.  Follows at Coumadin clinic for INR checks and dosing adjustments.   - Admitted inpatient (07/01/2022) and found to have TIA and new/recurrent LV thrombus despite therapeutic INR 2.7, therefore referred back to hematology for additional recommendations. - He was transitioned to LOVENOX (07/07/2022) due to apparent failure of both warfarin and Eliquis.  (Patient stopped Lovenox and put himself back on warfarin in May 2024) - Coagulopathy workup (09/09/2022): PT gene mutation negative.  Factor V Leiden pending  Negative beta-2 glycoprotein and cardiolipin antibodies.  Lupus anticoagulant not detected. - Most recent D-dimer (09/09/2022) normal at 0.47.  Baseline kidney function with creatinine 1.53/GFR 47.  No evidence of anemia (Hgb 16.7, ferritin 128, iron saturation 29%) - He denies any major bleeding events. - No symptoms of recurrent DVT or PE.  TIA symptoms have resolved. - PLAN: Due to evident failure of both Eliquis and warfarin, discussed with patient that he needs to go back to taking LOVENOX injections (1 mg/kg twice daily). - He will need to be on Lovenox indefinitely, but if we can get a repeat echocardiogram that shows resolution of LV thrombus, he can be changed to once daily dosing (1.5 mg/kg daily).  I will reach out to his cardiology team to discuss this further. - RTC in 3 months with CBC/D, BMP, D-dimer for ongoing  risk/benefit evaluation.    PLAN SUMMARY: >> Labs in 3 months = CBC/D, BMP, D-dimer >> OFFICE visit in 3 months     REVIEW OF SYSTEMS:  Review of Systems  Constitutional:  Positive for fatigue (Baseline). Negative for appetite change, chills, diaphoresis, fever and unexpected weight change.   HENT:   Negative for lump/mass and nosebleeds.   Eyes:  Negative for eye problems.  Respiratory:  Positive for shortness of breath (With exertion, baseline). Negative for cough and hemoptysis.   Cardiovascular:  Negative for chest pain, leg swelling and palpitations.  Gastrointestinal:  Negative for abdominal pain, blood in stool, constipation, diarrhea, nausea and vomiting.  Genitourinary:  Negative for hematuria.   Musculoskeletal:  Positive for arthralgias.  Skin: Negative.   Neurological:  Negative for dizziness, headaches and light-headedness.  Hematological:  Does not bruise/bleed easily.  Psychiatric/Behavioral:  Positive for sleep disturbance.      PHYSICAL EXAM:  ECOG PERFORMANCE STATUS: 1 - Symptomatic but completely ambulatory  There were no vitals filed for this visit. There were no vitals filed for this visit. Physical Exam Constitutional:      Appearance: Normal appearance. He is obese.  Cardiovascular:     Heart sounds: Normal heart sounds.  Pulmonary:     Breath sounds: Normal breath sounds.  Neurological:     General: No focal deficit present.     Mental Status: Mental status is at baseline.  Psychiatric:        Behavior: Behavior normal. Behavior is cooperative.     PAST MEDICAL/SURGICAL HISTORY:  Past Medical History:  Diagnosis Date   Apical mural thrombus    Arteriosclerotic cardiovascular disease (ASCVD)    a. s/p CABG in 2011 b. DES to RCA in 08/2017 c. cath in 06/2018 showing patent LIMA-LAD, SVG-OM1-OM2 and SVG-D1   Chronic anticoagulation    Chronic systolic CHF (congestive heart failure) (HCC)    a. EF 45-50% in 06/2012 b. 35-40% in 08/2017 with similar results in 06/2018 c. EF at 30-35% in 10/2020   CVA (cerebral vascular accident) (HCC)    HTN (hypertension)    Hyperlipidemia    Keloid    median sternotomy   Pulmonary embolism (HCC) 03/2010   Substance abuse (HCC)    formerly cocaine   Past Surgical History:  Procedure Laterality Date    COLONOSCOPY N/A 01/22/2017   Procedure: COLONOSCOPY;  Surgeon: West Bali, MD;  Location: [facility];  Service: Endoscopy;  Laterality: N/A;  200   CORONARY ARTERY BYPASS GRAFT  03/18/2010   LIMA-LAD, SVG to diagonal, OM1 & OM2   CORONARY STENT INTERVENTION N/A 08/21/2017   Procedure: CORONARY STENT INTERVENTION;  Surgeon: Tonny Bollman, MD;  Location: [facility] INVASIVE CV LAB;  Service: Cardiovascular;  Laterality: N/A;   LEFT HEART CATH AND CORS/GRAFTS ANGIOGRAPHY N/A 08/21/2017   Procedure: LEFT HEART CATH AND CORS/GRAFTS ANGIOGRAPHY;  Surgeon: Tonny Bollman, MD;  Location: [facility] INVASIVE CV LAB;  Service: Cardiovascular;  Laterality: N/A;   LEFT HEART CATH AND CORS/GRAFTS ANGIOGRAPHY N/A 06/21/2018   Procedure: LEFT HEART CATH AND CORS/GRAFTS ANGIOGRAPHY;  Surgeon: Yvonne Kendall, MD;  Location: [facility] INVASIVE CV LAB;  Service: Cardiovascular;  Laterality: N/A;   POLYPECTOMY  01/22/2017   Procedure: POLYPECTOMY;  Surgeon: West Bali, MD;  Location: [facility];  Service: Endoscopy;;  Transverse(CS) and sigmoid colon(HS)   PTCA  06/1996   LAD & RCA   TEE WITHOUT CARDIOVERSION N/A 06/10/2012   Procedure: TRANSESOPHAGEAL ECHOCARDIOGRAM (TEE);  Surgeon: Wendall Stade,  MD;  Location: [facility];  Service: Cardiovascular;  Laterality: N/A;    SOCIAL HISTORY:  Social History   Socioeconomic History   Marital status: Widowed    Spouse name: Not on file   Number of children: Not on file   Years of education: Not on file   Highest education level: Not on file  Occupational History   Occupation: disability  Tobacco Use   Smoking status: Former    Types: Cigarettes    Quit date: 2004    Years since quitting: 20.4   Smokeless tobacco: Never  Vaping Use   Vaping Use: Never used  Substance and Sexual Activity   Alcohol use: Not Currently    Comment: rare   Drug use: No    Types: Cocaine    Comment: Former cocaine abuse - last use for 51th birthday   Sexual activity: Not  Currently  Other Topics Concern   Not on file  Social History Narrative   Not on file   Social Determinants of Health   Financial Resource Strain: Low Risk  (10/30/2020)   Overall Financial Resource Strain (CARDIA)    Difficulty of Paying Living Expenses: Not very hard  Food Insecurity: No Food Insecurity (07/01/2022)   Hunger Vital Sign    Worried About Running Out of Food in the Last Year: Never true    Ran Out of Food in the Last Year: Never true  Transportation Needs: No Transportation Needs (07/01/2022)   PRAPARE - Administrator, Civil Service (Medical): No    Lack of Transportation (Non-Medical): No  Physical Activity: Insufficiently Active (10/30/2020)   Exercise Vital Sign    Days of Exercise per Week: 2 days    Minutes of Exercise per Session: 20 min  Stress: No Stress Concern Present (10/30/2020)   [employer] of Occupational Health - Occupational Stress Questionnaire    Feeling of Stress : Only a little  Social Connections: Moderately Isolated (10/30/2020)   Social Connection and Isolation Panel [NHANES]    Frequency of Communication with Friends and Family: Three times a week    Frequency of Social Gatherings with Friends and Family: Twice a week    Attends Religious Services: 1 to 4 times per year    Active Member of [employer] or Organizations: No    Attends Banker Meetings: Never    Marital Status: Widowed  Intimate Partner Violence: Not At Risk (07/01/2022)   Humiliation, Afraid, Rape, and Kick questionnaire    Fear of Current or Ex-Partner: No    Emotionally Abused: No    Physically Abused: No    Sexually Abused: No    FAMILY HISTORY:  Family History  Problem Relation Age of Onset   Cancer Mother 50   Stroke Daughter     CURRENT MEDICATIONS:  Outpatient Encounter Medications as of 09/23/2022  Medication Sig   atorvastatin (LIPITOR) 40 MG tablet Take 1 tablet (40 mg total) by mouth daily at 6 PM.   clopidogrel (PLAVIX) 75 MG  tablet Take 75 mg by mouth daily.   furosemide (LASIX) 20 MG tablet TAKE 3 TABLETS BY MOUTH EVERY DAY   hydrALAZINE (APRESOLINE) 50 MG tablet Take 1 tablet (50 mg total) by mouth 3 (three) times daily.   isosorbide mononitrate (IMDUR) 60 MG 24 hr tablet Take 1 tablet (60 mg total) by mouth daily.   JARDIANCE 10 MG TABS tablet TAKE 1 TABLET BY MOUTH DAILY BEFORE BREAKFAST.   methocarbamol (ROBAXIN) 500 MG tablet  Take 1 tablet (500 mg total) by mouth 2 (two) times daily as needed for muscle spasms.   metoprolol succinate (TOPROL-XL) 50 MG 24 hr tablet Take 50 mg by mouth daily.   nitroGLYCERIN (NITROSTAT) 0.4 MG SL tablet Place 1 tablet (0.4 mg total) under the tongue every 5 (five) minutes as needed for chest pain. up to 3 doses.   spironolactone (ALDACTONE) 25 MG tablet TAKE 1 TABLET (25 MG TOTAL) BY MOUTH DAILY.   warfarin (COUMADIN) 5 MG tablet Take 5 mg by mouth daily.   No facility-administered encounter medications on file as of 09/23/2022.    ALLERGIES:  Allergies  Allergen Reactions   Lisinopril Swelling and Other (See Comments)    Mouth and tongue swells    LABORATORY DATA:  I have reviewed the labs as listed.  CBC    Component Value Date/Time   WBC 4.9 09/13/2022 0926   RBC 5.36 09/13/2022 0926   HGB 15.3 09/13/2022 0926   HCT 47.7 09/13/2022 0926   PLT 154 09/13/2022 0926   MCV 89.0 09/13/2022 0926   MCH 28.5 09/13/2022 0926   MCHC 32.1 09/13/2022 0926   RDW 12.9 09/13/2022 0926   LYMPHSABS 2.0 09/13/2022 0926   MONOABS 0.5 09/13/2022 0926   EOSABS 0.2 09/13/2022 0926   BASOSABS 0.0 09/13/2022 0926      Latest Ref Rng & Units 09/13/2022    9:26 AM 09/09/2022    9:48 AM 08/25/2022   11:43 PM  CMP  Glucose 70 - 99 mg/dL 161  75  95   BUN 8 - 23 mg/dL 15  18  18    Creatinine 0.61 - 1.24 mg/dL 0.96  0.45  4.09   Sodium 135 - 145 mmol/L 136  135  136   Potassium 3.5 - 5.1 mmol/L 3.4  3.5  3.3   Chloride 98 - 111 mmol/L 104  101  106   CO2 22 - 32 mmol/L 25  26  23     Calcium 8.9 - 10.3 mg/dL 8.6  8.7  8.9   Total Protein 6.5 - 8.1 g/dL 7.1  7.9    Total Bilirubin 0.3 - 1.2 mg/dL 2.2  2.1    Alkaline Phos 38 - 126 U/L 60  52    AST 15 - 41 U/L 28  32    ALT 0 - 44 U/L 43  50      DIAGNOSTIC IMAGING:  I have independently reviewed the relevant imaging and discussed with the patient.   WRAP UP:  All questions were answered. The patient knows to call the clinic with any problems, questions or concerns.  Medical decision making: Moderate  Time spent on visit: I spent 20 minutes counseling the patient face to face. The total time spent in the appointment was 30 minutes and more than 50% was on counseling.  Carnella Guadalajara, PA-C  09/23/22 9:39 AM

## 2022-09-23 ENCOUNTER — Inpatient Hospital Stay (HOSPITAL_BASED_OUTPATIENT_CLINIC_OR_DEPARTMENT_OTHER): Payer: 59 | Admitting: Physician Assistant

## 2022-09-23 DIAGNOSIS — I2699 Other pulmonary embolism without acute cor pulmonale: Secondary | ICD-10-CM

## 2022-09-23 DIAGNOSIS — I513 Intracardiac thrombosis, not elsewhere classified: Secondary | ICD-10-CM

## 2022-09-23 DIAGNOSIS — Z86711 Personal history of pulmonary embolism: Secondary | ICD-10-CM | POA: Diagnosis not present

## 2022-09-23 MED ORDER — ENOXAPARIN SODIUM 100 MG/ML IJ SOSY
1.0000 mg/kg | PREFILLED_SYRINGE | Freq: Two times a day (BID) | INTRAMUSCULAR | 6 refills | Status: DC
Start: 2022-09-23 — End: 2023-03-01

## 2022-09-23 NOTE — Patient Instructions (Signed)
[facility] at [facility] **VISIT SUMMARY & IMPORTANT INSTRUCTIONS **   You were seen today by Rojelio Brenner PA-C for your blood clots.    BLOOD CLOTS You have had multiple blood clots (blood clots in your heart and blood clots in your lungs). You had blood clots even though you were on Eliquis (in the past). During your most recent hospital stay, it was discovered that you had a new blood clot in your heart even though you had been taking Coumadin (warfarin). Therefore, it is very important that we switch you to a new type of blood thinner.  You will need to start taking a blood thinner called LOVENOX (ENOXAPARIN INJECTIONS). You will take these injections twice a day (once in the morning once in the evening) for the rest of your life. Please ask the pharmacist to show you how to give yourself this injection. Once you have these injections from the pharmacy, you will STOP warfarin and take these injections instead.  (Continue to take your warfarin until you have started your Lovenox injections.)  **Please let our office know if you have any difficulty obtaining your Lovenox injections!  LABS: Return in 3 months for repeat labs  FOLLOW-UP APPOINTMENT: Office visit in 3 months (1 to 2 weeks after labs)  ** Thank you for trusting me with your healthcare!  I strive to provide all of my patients with quality care at each visit.  If you receive a survey for this visit, I would be so grateful to you for taking the time to provide feedback.  Thank you in advance!  ~ Rileyann Florance                   Dr. Doreatha Massed   &   Rojelio Brenner, PA-C   - - - - - - - - - - - - - - - - - -    Thank you for choosing [facility] at [facility] to provide your oncology and hematology care.  To afford each patient quality time with our provider, please arrive at least 15 minutes before your scheduled appointment time.   If you have a lab appointment with  the Cancer Center please come in thru the Main Entrance and check in at the main information desk.  You need to re-schedule your appointment should you arrive 10 or more minutes late.  We strive to give you quality time with our providers, and arriving late affects you and other patients whose appointments are after yours.  Also, if you no show three or more times for appointments you may be dismissed from the clinic at the providers discretion.     Again, thank you for choosing [facility].  Our hope is that these requests will decrease the amount of time that you wait before being seen by our physicians.       _____________________________________________________________  Should you have questions after your visit to [facility], please contact our office at [phone] and follow the prompts.  Our office hours are 8:00 a.m. and 4:30 p.m. Monday - Friday.  Please note that voicemails left after 4:00 p.m. may not be returned until the following business day.  We are closed weekends and major holidays.  You do have access to a nurse 24-7, just call the main number to the clinic [phone] and do not press any options, hold on the line and a nurse will answer the  phone.    For prescription refill requests, have your pharmacy contact our office and allow 72 hours.

## 2022-09-25 ENCOUNTER — Ambulatory Visit: Payer: 59 | Attending: Internal Medicine | Admitting: *Deleted

## 2022-09-25 DIAGNOSIS — Z86711 Personal history of pulmonary embolism: Secondary | ICD-10-CM | POA: Diagnosis not present

## 2022-09-25 DIAGNOSIS — Z5181 Encounter for therapeutic drug level monitoring: Secondary | ICD-10-CM

## 2022-09-25 DIAGNOSIS — I513 Intracardiac thrombosis, not elsewhere classified: Secondary | ICD-10-CM | POA: Diagnosis not present

## 2022-09-25 LAB — POCT INR

## 2022-09-28 NOTE — Progress Notes (Signed)
Please set up for echo with [facility] to reevaluate LVEF and also apical thrombus

## 2022-09-29 ENCOUNTER — Telehealth: Payer: Self-pay

## 2022-09-29 DIAGNOSIS — I513 Intracardiac thrombosis, not elsewhere classified: Secondary | ICD-10-CM

## 2022-09-29 LAB — FACTOR 5 LEIDEN

## 2022-09-29 NOTE — Telephone Encounter (Signed)
-----   Message from Loa Socks, LPN sent at 1/61/0960  1:58 PM EDT ----- [facility] pt of Dr. Tenny Craw  ----- Message ----- From: Pricilla Riffle, MD Sent: 09/28/2022  10:49 AM EDT To: Bertram Millard, RN     ----- Message ----- From: Darolyn Rua Sent: 09/23/2022   9:41 AM EDT To: Pricilla Riffle, MD; Ellsworth Lennox, PA-C  We follow Mr. 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  He failed Eliquis with recurrent PE in July 2022 despite being on Eliquis at the time for his LV mural thrombus.  He then failed warfarin therapy, evidenced by new/recurrent LV thrombus despite therapeutic INR 2.7 in March 2024.  Per our recommendation, patient was started on Lovenox on 07/07/2022.  He was compliant with this for about a month and a half, before he switched himself back to warfarin because he did not like the "knots on his skin" that the Lovenox injections were causing.  I saw him today and recommended that he once again stop warfarin and go back to taking Lovenox.  He is agreeable to this.  If you are able to repeat echo and show that LV thrombus has resolved, we could consider changing him to once daily dosing (1.5 mg/kg daily) rather than 1 mg/kg twice daily.  Please let me know your thoughts on the above.  I appreciate your collaborative care!  - Lupe Carney

## 2022-09-29 NOTE — Patient Instructions (Signed)
Warfarin for discontinued by the Cancer Center due to new blood clot in heart while taking warfarin.  Be has been started on Lovenox injections 2 x day for life per OV note.

## 2022-10-07 ENCOUNTER — Telehealth: Payer: Self-pay | Admitting: Student

## 2022-10-07 NOTE — Telephone Encounter (Signed)
    This was addressed in staff messages. Please see below and arrange for a repeat echo.   Thanks,  Aline Brochure, Sherol Dade, MD  Ellsworth Lennox, PA-C Lets get a repeat echo to confirm     ----- Message ----- From: Carlyon Prows Sent: 09/23/2022   7:21 PM EDT To: Pricilla Riffle, MD; Carnella Guadalajara, PA-C  Good afternoon,  I will defer to Dr. Tenny Craw since I last saw the patient in 2023 but that seems reasonable and we can assist with arranging with the repeat echo if she is in agreement. If his EF remains reduced, we still might be a similar situation but he did not have evidence of thrombus by echo last year when it was at 35-40%.  Best, [place]

## 2022-10-10 NOTE — Telephone Encounter (Signed)
Daughter notified and verbalized understanding. Daughter had no further questions or concerns at this time.

## 2022-10-10 NOTE — Telephone Encounter (Signed)
Attempted to contact patient no answer no voicemail

## 2022-10-10 NOTE — Telephone Encounter (Signed)
Patient's daughter is returning call. Requesting call back.  

## 2022-10-16 ENCOUNTER — Other Ambulatory Visit: Payer: Self-pay | Admitting: Internal Medicine

## 2022-11-19 ENCOUNTER — Ambulatory Visit (HOSPITAL_COMMUNITY)
Admission: RE | Admit: 2022-11-19 | Discharge: 2022-11-19 | Disposition: A | Discharge status: Home / Self Care | Payer: 59 | Source: Ambulatory Visit | Attending: Internal Medicine | Admitting: Internal Medicine

## 2022-11-19 DIAGNOSIS — I513 Intracardiac thrombosis, not elsewhere classified: Secondary | ICD-10-CM | POA: Diagnosis present

## 2022-11-19 LAB — ECHOCARDIOGRAM COMPLETE
AR max vel: 2.64 cm2
AV Area VTI: 2.66 cm2
AV Area mean vel: 2.59 cm2
AV Mean grad: 2.7 mmHg
AV Peak grad: 5.8 mmHg
Ao pk vel: 1.2 m/s
Area-P 1/2: 2.76 cm2
S' Lateral: 3.9 cm

## 2022-11-19 MED ORDER — PERFLUTREN LIPID MICROSPHERE
1.0000 mL | INTRAVENOUS | Status: AC | PRN
  Administered 2022-11-19: 5 mL via INTRAVENOUS

## 2022-11-19 NOTE — Progress Notes (Signed)
*  PRELIMINARY RESULTS* Echocardiogram 2D Echocardiogram has been performed with [facility].  Stacey Drain 11/19/2022, 10:22 AM

## 2022-11-29 ENCOUNTER — Emergency Department (HOSPITAL_COMMUNITY): Payer: 59

## 2022-11-29 ENCOUNTER — Encounter (HOSPITAL_COMMUNITY): Payer: Self-pay

## 2022-11-29 ENCOUNTER — Emergency Department (HOSPITAL_COMMUNITY)
Admission: EM | Admit: 2022-11-29 | Discharge: 2022-11-29 | Disposition: A | Discharge status: Home / Self Care | Payer: 59 | Source: Home / Self Care | Attending: Emergency Medicine | Admitting: Emergency Medicine

## 2022-11-29 ENCOUNTER — Other Ambulatory Visit: Payer: Self-pay

## 2022-11-29 DIAGNOSIS — Z7902 Long term (current) use of antithrombotics/antiplatelets: Secondary | ICD-10-CM | POA: Insufficient documentation

## 2022-11-29 DIAGNOSIS — Z7984 Long term (current) use of oral hypoglycemic drugs: Secondary | ICD-10-CM | POA: Insufficient documentation

## 2022-11-29 DIAGNOSIS — Z79899 Other long term (current) drug therapy: Secondary | ICD-10-CM | POA: Diagnosis not present

## 2022-11-29 DIAGNOSIS — S4991XA Unspecified injury of right shoulder and upper arm, initial encounter: Secondary | ICD-10-CM | POA: Diagnosis present

## 2022-11-29 DIAGNOSIS — I509 Heart failure, unspecified: Secondary | ICD-10-CM | POA: Diagnosis not present

## 2022-11-29 DIAGNOSIS — I110 Hypertensive heart disease with heart failure: Secondary | ICD-10-CM | POA: Diagnosis not present

## 2022-11-29 DIAGNOSIS — X58XXXA Exposure to other specified factors, initial encounter: Secondary | ICD-10-CM | POA: Diagnosis not present

## 2022-11-29 DIAGNOSIS — S46911A Strain of unspecified muscle, fascia and tendon at shoulder and upper arm level, right arm, initial encounter: Secondary | ICD-10-CM | POA: Insufficient documentation

## 2022-11-29 MED ORDER — METHOCARBAMOL 500 MG PO TABS: 500.0000 mg | ORAL_TABLET | Freq: Two times a day (BID) | ORAL | 0 refills | Status: DC | PRN

## 2022-11-29 MED ORDER — CYCLOBENZAPRINE HCL 10 MG PO TABS
5.0000 mg | ORAL_TABLET | Freq: Once | ORAL | Status: AC
  Administered 2022-11-29: 5 mg via ORAL
  Filled 2022-11-29: qty 1

## 2022-11-29 MED ORDER — ACETAMINOPHEN 500 MG PO TABS: 1000.0000 mg | ORAL_TABLET | Freq: Four times a day (QID) | ORAL | 0 refills | Status: DC | PRN

## 2022-11-29 NOTE — ED Provider Notes (Signed)
[place] EMERGENCY DEPARTMENT AT [facility] Provider Note   CSN: [id number] Arrival date & time: 11/29/22  7829     History  Chief Complaint  Patient presents with   Shoulder Pain    [name] is a 77 y.o. male.  The history is provided by the patient, a relative and medical records. No language interpreter was used.  Shoulder Pain    77 year old male significant history of prior stroke currently on Plavix, PE on Lovenox, substance abuse, CHF, hypertension, OSA presents ED today with complaint of shoulder pain.  Patient mention for the past 3 days he has had recurrent pain primarily to his right shoulder.  States pain is more noticeable when he reaches for something when he tried to pick up his grandchild.  Pain improves when he rests his arm.  Pain sometimes radiates towards the right side of his neck with occasional tightness sensation but not numbness.  He does not endorse any headache, lightheadedness, dizziness, chest pain, trouble breathing, arm weakness or loss of sensation.  He mention having similar symptoms like this in the past which relieved with muscle relaxant but he does not have this medication anymore.  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  Home Medications Prior to Admission medications   Medication Sig Start Date End Date Taking? Authorizing Provider  atorvastatin (LIPITOR) 40 MG tablet Take 1 tablet (40 mg total) by mouth daily at 6 PM. 03/09/17   Calvert Cantor, MD  clopidogrel (PLAVIX) 75 MG tablet Take 75 mg by mouth daily.    [provider]  enoxaparin (LOVENOX) 100 MG/ML injection Inject 0.95 mLs (95 mg total) into the skin every 12 (twelve) hours. 09/23/22   Carnella Guadalajara, PA-C  furosemide (LASIX) 20 MG tablet TAKE 3 TABLETS BY MOUTH EVERY DAY 10/02/20   Pricilla Riffle, MD  hydrALAZINE (APRESOLINE) 50 MG tablet TAKE 1 TABLET BY MOUTH THREE TIMES A DAY 10/16/22   Pricilla Riffle, MD  isosorbide  mononitrate (IMDUR) 60 MG 24 hr tablet Take 1 tablet (60 mg total) by mouth daily. 09/12/20 06/29/22  Pricilla Riffle, MD  JARDIANCE 10 MG TABS tablet TAKE 1 TABLET BY MOUTH DAILY BEFORE BREAKFAST. 03/28/22   Pricilla Riffle, MD  methocarbamol (ROBAXIN) 500 MG tablet Take 1 tablet (500 mg total) by mouth 2 (two) times daily as needed for muscle spasms. 09/13/22   Eber Hong, MD  metoprolol succinate (TOPROL-XL) 50 MG 24 hr tablet Take 50 mg by mouth daily. 04/27/17   [provider]  nitroGLYCERIN (NITROSTAT) 0.4 MG SL tablet Place 1 tablet (0.4 mg total) under the tongue every 5 (five) minutes as needed for chest pain. up to 3 doses. Patient not taking: Reported on 09/23/2022 07/05/13   Jodelle Gross, NP  spironolactone (ALDACTONE) 25 MG tablet TAKE 1 TABLET (25 MG TOTAL) BY MOUTH DAILY. 08/15/22 08/10/23  Ellsworth Lennox, PA-C      Allergies    Lisinopril    Review of Systems   Review of Systems  All other systems reviewed and are negative.   Physical Exam Updated Vital Signs BP 139/68 (BP Location: Left Arm)   Pulse 62   Temp 97.6 F (36.4 C) (Oral)   Resp 20   Ht 5\' 6"  (1.676 m)   Wt 94.3 kg   SpO2 96%   BMI 33.57 kg/m  Physical Exam Vitals and nursing note reviewed.  Constitutional:      General: He  is not in acute distress.    Appearance: He is well-developed.     Comments: Patient is hard of hearing but resting comfortably in no acute discomfort.  HENT:     Head: Atraumatic.  Eyes:     Extraocular Movements: Extraocular movements intact.     Conjunctiva/sclera: Conjunctivae normal.     Pupils: Pupils are equal, round, and reactive to light.  Cardiovascular:     Rate and Rhythm: Normal rate and regular rhythm.     Pulses: Normal pulses.     Heart sounds: Normal heart sounds.  Pulmonary:     Effort: Pulmonary effort is normal.     Breath sounds: Normal breath sounds.  Chest:     Chest wall: No tenderness.  Abdominal:     Palpations: Abdomen is soft.   Musculoskeletal:        General: Tenderness (Right shoulder: Mild tenderness to palpation of the deltoid, increased shoulder pain with arm raise.  Radial pulse 2+ normal grip strength) present.     Cervical back: Normal range of motion and neck supple. No tenderness.  Lymphadenopathy:     Cervical: No cervical adenopathy.  Skin:    Findings: No rash.  Neurological:     Mental Status: He is alert and oriented to person, place, and time.     GCS: GCS eye subscore is 4. GCS verbal subscore is 5. GCS motor subscore is 6.     Cranial Nerves: Cranial nerves 2-12 are intact.     Sensory: Sensation is intact.     Motor: Motor function is intact. No weakness.     ED Results / Procedures / Treatments   Labs (all labs ordered are listed, but only abnormal results are displayed) Labs Reviewed - No data to display  EKG None  Radiology No results found. CLINICAL DATA: Shoulder pain  EXAM: RIGHT SHOULDER - 3 VIEW  COMPARISON: 09/13/2022  FINDINGS: Degenerative spurring at the glenohumeral and especially acromioclavicular joints. No acute fracture, soft tissue calcification, or malalignment.  IMPRESSION: No acute finding.  Degenerative spurring at the glenohumeral and acromioclavicular joints.   Electronically Signed By: Tiburcio Pea M.D. On: 11/29/2022 10:41   Procedures Procedures    Medications Ordered in ED Medications  cyclobenzaprine (FLEXERIL) tablet 5 mg (5 mg Oral Given 11/29/22 1013)    ED Course/ Medical Decision Making/ A&P                                 Medical Decision Making Amount and/or Complexity of Data Reviewed Radiology: ordered.  Risk Prescription drug management.   BP 139/68 (BP Location: Left Arm)   Pulse 62   Temp 97.6 F (36.4 C) (Oral)   Resp 20   Ht 5\' 6"  (1.676 m)   Wt 94.3 kg   SpO2 96%   BMI 33.57 kg/m   105:55 AM  77 year old male significant history of prior stroke currently on Plavix, PE on Lovenox, substance abuse,  CHF, hypertension, OSA presents ED today with complaint of shoulder pain.  Patient mention for the past 3 days he has had recurrent pain primarily to his right shoulder.  States pain is more noticeable when he reaches for something when he tried to pick up his grandchild.  Pain improves when he rests his arm.  Pain sometimes radiates towards the right side of his neck with occasional tightness sensation but not numbness.  He does not endorse any headache, lightheadedness,  dizziness, chest pain, trouble breathing, arm weakness or loss of sensation.  He mention having similar symptoms like this in the past which relieved with muscle relaxant but he does not have this medication anymore.  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  On exam this is an elderly male hard of hearing but resting comfortably in bed appears to be in no acute discomfort.  Head is normocephalic, atraumatic, no significant tenderness to palpation of the neck, no carotid bruit no pulsatile mass.  He does not have any significant cervical midline spine tenderness.  Heart with normal rate and rhythm, lungs are clear to auscultation bilaterally abdomen is soft nontender.  Patient has normal grip strength bilaterally with equal strength to bilateral upper extremities.  He does have some increased discomfort to his left shoulder at the deltoid with arm raise but no appreciable weakness on loss of sensation.  I felt his symptoms are likely to be muscle skeletal strain and less likely to be an acute stroke as patient does not exhibit any focal weakness.  He is neurovascularly intact.  -Labs considered but not performed as sxs likely MSK, doubt stroke -The patient was maintained on a cardiac monitor.  I personally viewed and interpreted the cardiac monitored which showed an underlying rhythm of: NSR -Imaging independently viewed and interpreted by me and I agree with radiologist's interpretation.  Result remarkable  for R shoulder xray showing degenerative changes without acute finding -This patient presents to the ED for concern of shoulder pain, this involves an extensive number of treatment options, and is a complaint that carries with it a high risk of complications and morbidity.  The differential diagnosis includes shoulder strain, radicular pain, fx, dislocation, cellulitis, MI -Co morbidities that complicate the patient evaluation includes prior stroke, OSA, CAD, CKD -Treatment includes flexeril -Reevaluation of the patient after these medicines showed that the patient improved -PCP office notes or outside notes reviewed -Discussion with specialist attending Dr. Estell Harpin -Escalation to admission/observation considered: patients feels much better, is comfortable with discharge, and will follow up with PCP -Prescription medication considered, patient comfortable with muscle relaxant -Social Determinant of Health considered which includes tobacco use, lack of activity and lack of social connection .         Final Clinical Impression(s) / ED Diagnoses Final diagnoses:  Right shoulder strain, initial encounter    Rx / DC Orders ED Discharge Orders          Ordered    methocarbamol (ROBAXIN) 500 MG tablet  2 times daily PRN        11/29/22 1115    acetaminophen (TYLENOL) 500 MG tablet  Every 6 hours PRN        11/29/22 1115              Fayrene Helper, PA-C 11/29/22 1116    Bethann Berkshire, MD 11/30/22 [phone]

## 2022-11-29 NOTE — ED Triage Notes (Signed)
Pt states was seen in ED before and given muscle relaxer for pain. States he has not seen PCP about issue and ran out of medication, so came back today. No blood thinners per pt. Does take fluid pills.

## 2022-11-29 NOTE — Discharge Instructions (Addendum)
You have been evaluated for your symptoms.  Your shoulder pain is likely due to muscle strain.  X-ray of your shoulder today is showing degenerative spurring of your shoulder joint likely contributing to your symptoms but no broken bone or shoulder dislocation.  You may take Tylenol and muscle relaxant as needed for symptom control and follow-up closely with your doctor for further care.  Return to the ER if you have any concern.

## 2022-12-04 ENCOUNTER — Telehealth: Payer: Self-pay | Admitting: *Deleted

## 2022-12-04 NOTE — Telephone Encounter (Signed)
Patient's daughter [name] called to make [place] aware that he does not want to take his Lovenox any longer and questioned if he could go back on Coumadin.  Explained to her that due to his history of CVA, PE and LV thrombus that occurred on oral therapies, this was his only option for treatment and he would need to take it as prescribed indefinitely, not doing so would pose a high risk of recurrence.  Urged her to encourage him to continue injections for these reasons.  Verbalized understanding and states that she will ensure he does not stop therapy.

## 2022-12-23 ENCOUNTER — Other Ambulatory Visit: Payer: Self-pay | Admitting: Internal Medicine

## 2022-12-24 ENCOUNTER — Inpatient Hospital Stay: Payer: 59 | Attending: Physician Assistant

## 2022-12-24 DIAGNOSIS — G459 Transient cerebral ischemic attack, unspecified: Secondary | ICD-10-CM | POA: Insufficient documentation

## 2022-12-24 DIAGNOSIS — I2510 Atherosclerotic heart disease of native coronary artery without angina pectoris: Secondary | ICD-10-CM | POA: Insufficient documentation

## 2022-12-24 DIAGNOSIS — R0602 Shortness of breath: Secondary | ICD-10-CM | POA: Insufficient documentation

## 2022-12-24 DIAGNOSIS — Z7902 Long term (current) use of antithrombotics/antiplatelets: Secondary | ICD-10-CM | POA: Insufficient documentation

## 2022-12-24 DIAGNOSIS — Z809 Family history of malignant neoplasm, unspecified: Secondary | ICD-10-CM | POA: Insufficient documentation

## 2022-12-24 DIAGNOSIS — Z8673 Personal history of transient ischemic attack (TIA), and cerebral infarction without residual deficits: Secondary | ICD-10-CM | POA: Insufficient documentation

## 2022-12-24 DIAGNOSIS — Z87891 Personal history of nicotine dependence: Secondary | ICD-10-CM | POA: Insufficient documentation

## 2022-12-24 DIAGNOSIS — I129 Hypertensive chronic kidney disease with stage 1 through stage 4 chronic kidney disease, or unspecified chronic kidney disease: Secondary | ICD-10-CM | POA: Insufficient documentation

## 2022-12-24 DIAGNOSIS — I2699 Other pulmonary embolism without acute cor pulmonale: Secondary | ICD-10-CM

## 2022-12-24 DIAGNOSIS — E785 Hyperlipidemia, unspecified: Secondary | ICD-10-CM | POA: Insufficient documentation

## 2022-12-24 DIAGNOSIS — N183 Chronic kidney disease, stage 3 unspecified: Secondary | ICD-10-CM | POA: Diagnosis not present

## 2022-12-24 DIAGNOSIS — Z888 Allergy status to other drugs, medicaments and biological substances status: Secondary | ICD-10-CM | POA: Insufficient documentation

## 2022-12-24 DIAGNOSIS — Z79899 Other long term (current) drug therapy: Secondary | ICD-10-CM | POA: Insufficient documentation

## 2022-12-24 DIAGNOSIS — Z7901 Long term (current) use of anticoagulants: Secondary | ICD-10-CM | POA: Diagnosis not present

## 2022-12-24 DIAGNOSIS — Z86711 Personal history of pulmonary embolism: Secondary | ICD-10-CM | POA: Diagnosis present

## 2022-12-24 DIAGNOSIS — Z823 Family history of stroke: Secondary | ICD-10-CM | POA: Insufficient documentation

## 2022-12-24 DIAGNOSIS — I513 Intracardiac thrombosis, not elsewhere classified: Secondary | ICD-10-CM

## 2022-12-24 LAB — CBC WITH DIFFERENTIAL/PLATELET
Abs Immature Granulocytes: 0 10*3/uL (ref 0.00–0.07)
Basophils Absolute: 0.1 10*3/uL (ref 0.0–0.1)
Basophils Relative: 1 %
Eosinophils Absolute: 0.2 10*3/uL (ref 0.0–0.5)
Eosinophils Relative: 3 %
HCT: 49.6 % (ref 39.0–52.0)
Hemoglobin: 15.9 g/dL (ref 13.0–17.0)
Immature Granulocytes: 0 %
Lymphocytes Relative: 47 %
Lymphs Abs: 3 10*3/uL (ref 0.7–4.0)
MCH: 28.8 pg (ref 26.0–34.0)
MCHC: 32.1 g/dL (ref 30.0–36.0)
MCV: 89.9 fL (ref 80.0–100.0)
Monocytes Absolute: 0.8 10*3/uL (ref 0.1–1.0)
Monocytes Relative: 14 %
Neutro Abs: 2.1 10*3/uL (ref 1.7–7.7)
Neutrophils Relative %: 35 %
Platelets: 167 10*3/uL (ref 150–400)
RBC: 5.52 MIL/uL (ref 4.22–5.81)
RDW: 13.2 % (ref 11.5–15.5)
WBC: 6.2 10*3/uL (ref 4.0–10.5)
nRBC: 0 % (ref 0.0–0.2)

## 2022-12-24 LAB — BASIC METABOLIC PANEL WITH GFR
Anion gap: 8 (ref 5–15)
BUN: 17 mg/dL (ref 8–23)
CO2: 29 mmol/L (ref 22–32)
Calcium: 9 mg/dL (ref 8.9–10.3)
Chloride: 98 mmol/L (ref 98–111)
Creatinine, Ser: 1.61 mg/dL — ABNORMAL HIGH (ref 0.61–1.24)
GFR, Estimated: 44 mL/min — ABNORMAL LOW (ref >60)
Glucose, Bld: 76 mg/dL (ref 70–99)
Potassium: 3.8 mmol/L (ref 3.5–5.1)
Sodium: 135 mmol/L (ref 135–145)

## 2022-12-24 LAB — D-DIMER, QUANTITATIVE: D-Dimer, Quant: 0.8 ug{FEU}/mL — ABNORMAL HIGH (ref 0.00–0.50)

## 2022-12-30 NOTE — Progress Notes (Unsigned)
[facility] [address], [place] [address]   CLINIC:  Medical Oncology/Hematology  PCP:  Benetta Spar, MD [address] [place] [address] [phone]   REASON FOR VISIT:  Follow-up for recurrent PE  PRIOR THERAPY: Eliquis, warfarin  CURRENT THERAPY: Lovenox  INTERVAL HISTORY:   [name] 77 y.o. 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  He was last seen by Rojelio Brenner PA-C on 09/23/2022.    He was compliant with restarted Lovenox twice daily after his visit on 09/23/2022, although he occasionally forgets to take one of his doses.  He does continue to complain about the "little knots under his skin" after receiving his injection.  He remains on Plavix after his TIA.  He denies any adverse bleeding effects - no epistaxis, melena, hematochezia, hematemesis, or hematuria.  He has not had any interval DVT or PE.  He has mild dyspnea on exertion but denies any chest pain, palpitations, leg swelling, or shortness of breath at rest.  He has 100% energy and 100% appetite. He endorses that he is maintaining a stable weight.  ASSESSMENT & PLAN:  1.  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 - Admitted inpatient (07/01/2022) and found to have TIA and new/recurrent LV thrombus despite  therapeutic INR 2.7, therefore referred back to hematology for additional recommendations. - He was transitioned to LOVENOX (07/07/2022) due to apparent failure of both warfarin and Eliquis.  (Patient stopped Lovenox and put himself back on warfarin in May 2024, but was instructed to switch back to Lovenox at his visit on 09/23/2022.) - Coagulopathy workup (09/09/2022): PT gene mutation negative.  Factor V Leiden negative Negative beta-2 glycoprotein and cardiolipin antibodies.  Lupus anticoagulant not detected. 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   - Most recent labs (12/24/2022): D-dimer mildly elevated at 0.80, baseline CKD with creatinine 1.61/GFR 44.  No evidence of anemia (Hgb 15.9).  Previous iron panel in June 2024 was normal. - He denies any major bleeding events. - No symptoms of recurrent DVT or PE.  TIA symptoms have resolved. - PLAN: Due to evident failure of both Eliquis and warfarin, discussed with patient that he needs to continue indefinite anticoagulation with LOVENOX injections (1 mg/kg twice daily). - Most recent echocardiogram shows persistent small left apical thrombus, therefore we will continue full dose anticoagulation.  However, if any future echocardiograms shows resolution of LV thrombus, he can be changed to once daily dosing (1.5 mg/kg daily).   - RTC in 6 months with CBC/D, BMP, D-dimer for ongoing risk/benefit evaluation.    PLAN SUMMARY: >> Labs in 6 months = CBC/D, BMP, D-dimer >> OFFICE visit in 6 months     REVIEW OF SYSTEMS:  Review of Systems  Constitutional:  Negative for appetite change, chills, diaphoresis, fatigue (Baseline), fever and unexpected weight change.  HENT:  Negative for lump/mass and nosebleeds.   Eyes:  Negative for eye problems.  Respiratory:  Positive for shortness of breath (mild, with exertion). Negative for cough and hemoptysis.   Cardiovascular:  Negative for chest pain, leg  swelling and palpitations.  Gastrointestinal:  Negative for abdominal pain, blood in stool, constipation, diarrhea, nausea and vomiting.  Genitourinary:  Negative for hematuria.   Skin: Negative.   Neurological:  Negative for dizziness, headaches and light-headedness.  Hematological:  Does not bruise/bleed easily.  Psychiatric/Behavioral:  Negative for sleep disturbance.      PHYSICAL EXAM:  ECOG PERFORMANCE STATUS: 1 - Symptomatic but completely ambulatory  Vitals:   12/31/22 0859  BP: 118/65  Pulse: 63  Resp: 18  Temp: 97.8 F (36.6 C)  SpO2: 98%   Filed Weights   12/31/22 0859  Weight: 206 lb (93.4 kg)   Physical Exam Constitutional:      Appearance: Normal appearance. He is obese.  Cardiovascular:     Heart sounds: Normal heart sounds.  Pulmonary:     Breath sounds: Normal breath sounds.  Neurological:     General: No focal deficit present.     Mental Status: Mental status is at baseline.  Psychiatric:        Behavior: Behavior normal. Behavior is cooperative.    PAST MEDICAL/SURGICAL HISTORY:  Past Medical History:  Diagnosis Date   Apical mural thrombus    Arteriosclerotic cardiovascular disease (ASCVD)    a. s/p CABG in 2011 b. DES to RCA in 08/2017 c. cath in 06/2018 showing patent LIMA-LAD, SVG-OM1-OM2 and SVG-D1   Chronic anticoagulation    Chronic systolic CHF (congestive heart failure) (HCC)    a. EF 45-50% in 06/2012 b. 35-40% in 08/2017 with similar results in 06/2018 c. EF at 30-35% in 10/2020   CVA (cerebral vascular accident) (HCC)    HTN (hypertension)    Hyperlipidemia    Keloid    median sternotomy   Pulmonary embolism (HCC) 03/2010   Substance abuse (HCC)    formerly cocaine   Past Surgical History:  Procedure Laterality Date   COLONOSCOPY N/A 01/22/2017   Procedure: COLONOSCOPY;  Surgeon: West Bali, MD;  Location: [facility];  Service: Endoscopy;  Laterality: N/A;  200   CORONARY ARTERY BYPASS GRAFT  03/18/2010   LIMA-LAD,  SVG to diagonal, OM1 & OM2   CORONARY STENT INTERVENTION N/A 08/21/2017   Procedure: CORONARY STENT INTERVENTION;  Surgeon: Tonny Bollman, MD;  Location[facility] INVASIVE CV LAB;  Service: Cardiovascular;  Laterality: N/A;   LEFT HEART CATH AND CORS/GRAFTS ANGIOGRAPHY N/A 08/21/2017   Procedure: LEFT HEART CATH AND CORS/GRAFTS ANGIOGRAPHY;  Surgeon: Tonny Bollman, MD;  Location: [facility] INVASIVE CV LAB;  Service: Cardiovascular;  Laterality: N/A;   LEFT HEART CATH AND CORS/GRAFTS ANGIOGRAPHY N/A 06/21/2018   Procedure: LEFT HEART CATH AND CORS/GRAFTS ANGIOGRAPHY;  Surgeon: Yvonne Kendall, MD;  Location: [facility] INVASIVE CV LAB;  Service: Cardiovascular;  Laterality: N/A;   POLYPECTOMY  01/22/2017   Procedure: POLYPECTOMY;  Surgeon: West Bali, MD;  Location: [facility];  Service: Endoscopy;;  Transverse(CS) and sigmoid colon(HS)   PTCA  06/1996   LAD & RCA   TEE WITHOUT CARDIOVERSION N/A 06/10/2012   Procedure: TRANSESOPHAGEAL ECHOCARDIOGRAM (TEE);  Surgeon: Wendall Stade, MD;  Location: [facility];  Service: Cardiovascular;  Laterality: N/A;    SOCIAL HISTORY:  Social History   Socioeconomic History   Marital status: Widowed    Spouse name: Not on file  Number of children: Not on file   Years of education: Not on file   Highest education level: Not on file  Occupational History   Occupation: disability  Tobacco Use   Smoking status: Former    Current packs/day: 0.00    Types: Cigarettes    Quit date: 2004    Years since quitting: 20.7   Smokeless tobacco: Never  Vaping Use   Vaping status: Never Used  Substance and Sexual Activity   Alcohol use: Not Currently    Comment: rare   Drug use: No    Types: Cocaine    Comment: Former cocaine abuse - last use for 51th birthday   Sexual activity: Not Currently  Other Topics Concern   Not on file  Social History Narrative   Not on file   Social Determinants of Health   Financial Resource Strain: Low Risk  (10/30/2020)   Overall  Financial Resource Strain (CARDIA)    Difficulty of Paying Living Expenses: Not very hard  Food Insecurity: No Food Insecurity (07/01/2022)   Hunger Vital Sign    Worried About Running Out of Food in the Last Year: Never true    Ran Out of Food in the Last Year: Never true  Transportation Needs: No Transportation Needs (07/01/2022)   PRAPARE - Administrator, Civil Service (Medical): No    Lack of Transportation (Non-Medical): No  Physical Activity: Insufficiently Active (10/30/2020)   Exercise Vital Sign    Days of Exercise per Week: 2 days    Minutes of Exercise per Session: 20 min  Stress: No Stress Concern Present (10/30/2020)   [employer] of Occupational Health - Occupational Stress Questionnaire    Feeling of Stress : Only a little  Social Connections: Moderately Isolated (10/30/2020)   Social Connection and Isolation Panel [NHANES]    Frequency of Communication with Friends and Family: Three times a week    Frequency of Social Gatherings with Friends and Family: Twice a week    Attends Religious Services: 1 to 4 times per year    Active Member of [employer] or Organizations: No    Attends Banker Meetings: Never    Marital Status: Widowed  Intimate Partner Violence: Not At Risk (07/01/2022)   Humiliation, Afraid, Rape, and Kick questionnaire    Fear of Current or Ex-Partner: No    Emotionally Abused: No    Physically Abused: No    Sexually Abused: No    FAMILY HISTORY:  Family History  Problem Relation Age of Onset   Cancer Mother 97   Stroke Daughter     CURRENT MEDICATIONS:  Outpatient Encounter Medications as of 12/31/2022  Medication Sig   acetaminophen (TYLENOL) 500 MG tablet Take 2 tablets (1,000 mg total) by mouth every 6 (six) hours as needed for moderate pain.   atorvastatin (LIPITOR) 40 MG tablet Take 1 tablet (40 mg total) by mouth daily at 6 PM.   clopidogrel (PLAVIX) 75 MG tablet Take 75 mg by mouth daily.   empagliflozin  (JARDIANCE) 10 MG TABS tablet TAKE 1 TABLET BY MOUTH EVERY DAY BEFORE BREAKFAST   enoxaparin (LOVENOX) 100 MG/ML injection Inject 0.95 mLs (95 mg total) into the skin every 12 (twelve) hours.   furosemide (LASIX) 20 MG tablet TAKE 3 TABLETS BY MOUTH EVERY DAY   hydrALAZINE (APRESOLINE) 50 MG tablet TAKE 1 TABLET BY MOUTH THREE TIMES A DAY   isosorbide mononitrate (IMDUR) 60 MG 24 hr tablet Take 1 tablet (60 mg total)  by mouth daily.   methocarbamol (ROBAXIN) 500 MG tablet Take 1 tablet (500 mg total) by mouth 2 (two) times daily as needed for muscle spasms.   metoprolol succinate (TOPROL-XL) 50 MG 24 hr tablet Take 50 mg by mouth daily.   nitroGLYCERIN (NITROSTAT) 0.4 MG SL tablet Place 1 tablet (0.4 mg total) under the tongue every 5 (five) minutes as needed for chest pain. up to 3 doses.   spironolactone (ALDACTONE) 25 MG tablet TAKE 1 TABLET (25 MG TOTAL) BY MOUTH DAILY.   No facility-administered encounter medications on file as of 12/31/2022.    ALLERGIES:  Allergies  Allergen Reactions   Lisinopril Swelling and Other (See Comments)    Mouth and tongue swells    LABORATORY DATA:  I have reviewed the labs as listed.  CBC    Component Value Date/Time   WBC 6.2 12/24/2022 1000   RBC 5.52 12/24/2022 1000   HGB 15.9 12/24/2022 1000   HCT 49.6 12/24/2022 1000   PLT 167 12/24/2022 1000   MCV 89.9 12/24/2022 1000   MCH 28.8 12/24/2022 1000   MCHC 32.1 12/24/2022 1000   RDW 13.2 12/24/2022 1000   LYMPHSABS 3.0 12/24/2022 1000   MONOABS 0.8 12/24/2022 1000   EOSABS 0.2 12/24/2022 1000   BASOSABS 0.1 12/24/2022 1000      Latest Ref Rng & Units 12/24/2022   10:00 AM 09/13/2022    9:26 AM 09/09/2022    9:48 AM  CMP  Glucose 70 - 99 mg/dL 76  811  75   BUN 8 - 23 mg/dL 17  15  18    Creatinine 0.61 - 1.24 mg/dL 9.14  7.82  9.56   Sodium 135 - 145 mmol/L 135  136  135   Potassium 3.5 - 5.1 mmol/L 3.8  3.4  3.5   Chloride 98 - 111 mmol/L 98  104  101   CO2 22 - 32 mmol/L 29  25  26     Calcium 8.9 - 10.3 mg/dL 9.0  8.6  8.7   Total Protein 6.5 - 8.1 g/dL  7.1  7.9   Total Bilirubin 0.3 - 1.2 mg/dL  2.2  2.1   Alkaline Phos 38 - 126 U/L  60  52   AST 15 - 41 U/L  28  32   ALT 0 - 44 U/L  43  50     DIAGNOSTIC IMAGING:  I have independently reviewed the relevant imaging and discussed with the patient.   WRAP UP:  All questions were answered. The patient knows to call the clinic with any problems, questions or concerns.  Medical decision making: Low  Time spent on visit: I spent 15 minutes counseling the patient face to face. The total time spent in the appointment was 22 minutes and more than 50% was on counseling.  Carnella Guadalajara, PA-C  12/31/22 9:42 AM

## 2022-12-31 ENCOUNTER — Encounter: Payer: Self-pay | Admitting: Physician Assistant

## 2022-12-31 ENCOUNTER — Inpatient Hospital Stay (HOSPITAL_BASED_OUTPATIENT_CLINIC_OR_DEPARTMENT_OTHER): Payer: 59 | Admitting: Physician Assistant

## 2022-12-31 VITALS — BP 118/65 | HR 63 | Temp 97.8°F | Resp 18 | Wt 206.0 lb

## 2022-12-31 DIAGNOSIS — I513 Intracardiac thrombosis, not elsewhere classified: Secondary | ICD-10-CM | POA: Diagnosis not present

## 2022-12-31 DIAGNOSIS — I2699 Other pulmonary embolism without acute cor pulmonale: Secondary | ICD-10-CM

## 2022-12-31 DIAGNOSIS — Z7901 Long term (current) use of anticoagulants: Secondary | ICD-10-CM

## 2022-12-31 DIAGNOSIS — Z86711 Personal history of pulmonary embolism: Secondary | ICD-10-CM | POA: Diagnosis not present

## 2022-12-31 NOTE — Patient Instructions (Signed)
[facility] **VISIT SUMMARY & IMPORTANT INSTRUCTIONS **   You were seen today by Rojelio Brenner PA-C for your blood clots.    BLOOD CLOTS You have had multiple blood clots (blood clots in your heart and blood clots in your lungs). You had blood clots even though you were on Eliquis (in the past). During your most recent hospital stay, it was discovered that you had a new blood clot in your heart even though you had been taking Coumadin (warfarin). Therefore, it is very important that we switch you to a new type of blood thinner.  You will need to start taking a blood thinner called LOVENOX (ENOXAPARIN INJECTIONS). You will take these injections twice a day (once in the morning once in the evening) for the rest of your life.  LABS: Return in 6 months for repeat labs  FOLLOW-UP APPOINTMENT: Office visit in 6 months  ** Thank you for trusting me with your healthcare!  I strive to provide all of my patients with quality care at each visit.  If you receive a survey for this visit, I would be so grateful to you for taking the time to provide feedback.  Thank you in advance!  ~ Indi Willhite                   Dr. Doreatha Massed   &   Rojelio Brenner, PA-C   - - - - - - - - - - - - - - - - - -    Thank you for choosing  [facility] Center at [facility] to provide your oncology and hematology care.  To afford each patient quality time with our provider, please arrive at least 15 minutes before your scheduled appointment time.   If you have a lab appointment with the Cancer Center please come in thru the Main Entrance and check in at the main information desk.  You need to re-schedule your appointment should you arrive 10 or more minutes late.  We strive to give you quality time with our providers, and arriving late affects you and other patients whose appointments are after yours.  Also, if you no show three or more times for appointments  you may be dismissed from the clinic at the providers discretion.     Again, thank you for choosing [facility].  Our hope is that these requests will decrease the amount of time that you wait before being seen by our physicians.       _____________________________________________________________  Should you have questions after your visit to [facility], please contact our office at [phone] and follow the prompts.  Our office hours are 8:00 a.m. and 4:30 p.m. Monday - Friday.  Please note that voicemails left after 4:00 p.m. may not be returned until the following business day.  We are closed weekends and major holidays.  You do have access to a nurse 24-7, just call the main number to the clinic [phone] and do not press any options, hold on the line and a nurse will answer the phone.    For prescription refill requests, have your pharmacy contact our office and allow 72 hours.

## 2023-01-26 ENCOUNTER — Encounter (HOSPITAL_COMMUNITY): Payer: Self-pay

## 2023-01-26 ENCOUNTER — Emergency Department (HOSPITAL_COMMUNITY): Payer: 59

## 2023-01-26 ENCOUNTER — Other Ambulatory Visit: Payer: Self-pay

## 2023-01-26 ENCOUNTER — Emergency Department (HOSPITAL_COMMUNITY)
Admission: EM | Admit: 2023-01-26 | Discharge: 2023-01-26 | Disposition: A | Discharge status: Home / Self Care | Payer: 59 | Attending: Emergency Medicine | Admitting: Emergency Medicine

## 2023-01-26 DIAGNOSIS — Z79899 Other long term (current) drug therapy: Secondary | ICD-10-CM | POA: Insufficient documentation

## 2023-01-26 DIAGNOSIS — Z7902 Long term (current) use of antithrombotics/antiplatelets: Secondary | ICD-10-CM | POA: Insufficient documentation

## 2023-01-26 DIAGNOSIS — Z8673 Personal history of transient ischemic attack (TIA), and cerebral infarction without residual deficits: Secondary | ICD-10-CM | POA: Insufficient documentation

## 2023-01-26 DIAGNOSIS — R200 Anesthesia of skin: Secondary | ICD-10-CM | POA: Diagnosis present

## 2023-01-26 DIAGNOSIS — I110 Hypertensive heart disease with heart failure: Secondary | ICD-10-CM | POA: Diagnosis not present

## 2023-01-26 DIAGNOSIS — I509 Heart failure, unspecified: Secondary | ICD-10-CM | POA: Insufficient documentation

## 2023-01-26 DIAGNOSIS — R202 Paresthesia of skin: Secondary | ICD-10-CM

## 2023-01-26 LAB — URINALYSIS, ROUTINE W REFLEX MICROSCOPIC
Bacteria, UA: NONE SEEN
Bilirubin Urine: NEGATIVE
Glucose, UA: >=500 mg/dL — AB
Hgb urine dipstick: NEGATIVE
Ketones, ur: NEGATIVE mg/dL
Leukocytes,Ua: NEGATIVE
Nitrite: NEGATIVE
Protein, ur: NEGATIVE mg/dL
Specific Gravity, Urine: 1.009 (ref 1.005–1.030)
pH: 5 (ref 5.0–8.0)

## 2023-01-26 LAB — COMPREHENSIVE METABOLIC PANEL
ALT: 38 U/L (ref 0–44)
AST: 24 U/L (ref 15–41)
Albumin: 4 g/dL (ref 3.5–5.0)
Alkaline Phosphatase: 50 U/L (ref 38–126)
Anion gap: 7 (ref 5–15)
BUN: 14 mg/dL (ref 8–23)
CO2: 25 mmol/L (ref 22–32)
Calcium: 8.5 mg/dL — ABNORMAL LOW (ref 8.9–10.3)
Chloride: 103 mmol/L (ref 98–111)
Creatinine, Ser: 1.42 mg/dL — ABNORMAL HIGH (ref 0.61–1.24)
GFR, Estimated: 51 mL/min — ABNORMAL LOW (ref >60)
Glucose, Bld: 147 mg/dL — ABNORMAL HIGH (ref 70–99)
Potassium: 3.4 mmol/L — ABNORMAL LOW (ref 3.5–5.1)
Sodium: 135 mmol/L (ref 135–145)
Total Bilirubin: 1.4 mg/dL — ABNORMAL HIGH (ref 0.3–1.2)
Total Protein: 7.7 g/dL (ref 6.5–8.1)

## 2023-01-26 LAB — DIFFERENTIAL
Abs Immature Granulocytes: 0.01 10*3/uL (ref 0.00–0.07)
Basophils Absolute: 0.1 10*3/uL (ref 0.0–0.1)
Basophils Relative: 1 %
Eosinophils Absolute: 0.1 10*3/uL (ref 0.0–0.5)
Eosinophils Relative: 2 %
Immature Granulocytes: 0 %
Lymphocytes Relative: 46 %
Lymphs Abs: 2.1 10*3/uL (ref 0.7–4.0)
Monocytes Absolute: 0.5 10*3/uL (ref 0.1–1.0)
Monocytes Relative: 11 %
Neutro Abs: 1.9 10*3/uL (ref 1.7–7.7)
Neutrophils Relative %: 40 %

## 2023-01-26 LAB — CBC
HCT: 50.9 % (ref 39.0–52.0)
Hemoglobin: 16.1 g/dL (ref 13.0–17.0)
MCH: 28.6 pg (ref 26.0–34.0)
MCHC: 31.6 g/dL (ref 30.0–36.0)
MCV: 90.4 fL (ref 80.0–100.0)
Platelets: 167 10*3/uL (ref 150–400)
RBC: 5.63 MIL/uL (ref 4.22–5.81)
RDW: 13.7 % (ref 11.5–15.5)
WBC: 4.8 10*3/uL (ref 4.0–10.5)
nRBC: 0 % (ref 0.0–0.2)

## 2023-01-26 LAB — PROTIME-INR
INR: 1 (ref 0.8–1.2)
Prothrombin Time: 13.6 s (ref 11.4–15.2)

## 2023-01-26 LAB — RAPID URINE DRUG SCREEN, HOSP PERFORMED
Amphetamines: NOT DETECTED
Barbiturates: NOT DETECTED
Benzodiazepines: NOT DETECTED
Cocaine: NOT DETECTED
Opiates: NOT DETECTED
Tetrahydrocannabinol: NOT DETECTED

## 2023-01-26 LAB — APTT: aPTT: 35 s (ref 24–36)

## 2023-01-26 LAB — ETHANOL: Alcohol, Ethyl (B): <10 mg/dL (ref <10)

## 2023-01-26 LAB — CBG MONITORING, ED: Glucose-Capillary: 153 mg/dL — ABNORMAL HIGH (ref 70–99)

## 2023-01-26 NOTE — Discharge Instructions (Signed)
Follow-up with your family doctor this week for recheck 

## 2023-01-26 NOTE — ED Triage Notes (Signed)
Pt started feeling facial numbness around 0630. Reports facial tingling.

## 2023-01-26 NOTE — ED Provider Notes (Signed)
[place] EMERGENCY DEPARTMENT AT [facility] Provider Note   CSN: [id number] Arrival date & time: 01/26/23  1023     History  Chief Complaint  Patient presents with   Numbness    facial    [name] is a 77 y.o. male.  HPI 77 year old male presents with concern for stroke.  He has a history of PE, CHF, prior strokes, hypertension.  He is on Plavix and Lovenox.  He most recently took his Lovenox this morning.  He states that this morning when he woke up at 5:30 AM he was fine.  At some point, he thinks about an hour later, he noticed some numbness to the right side of his face when he felt his face.  However I asked him in several different ways in several different times and it is unclear when exactly this started.  There is no headache.  He has chronic right shoulder discomfort and numbness that is there currently but has been an ongoing problem for a while.  He is concerned about a recurrent stroke.  No leg symptoms.  No facial droop, slurred speech, vision complaints.  Home Medications Prior to Admission medications   Medication Sig Start Date End Date Taking? Authorizing Provider  acetaminophen (TYLENOL) 500 MG tablet Take 2 tablets (1,000 mg total) by mouth every 6 (six) hours as needed for moderate pain. 11/29/22   Fayrene Helper, PA-C  atorvastatin (LIPITOR) 40 MG tablet Take 1 tablet (40 mg total) by mouth daily at 6 PM. 03/09/17   Calvert Cantor, MD  clopidogrel (PLAVIX) 75 MG tablet Take 75 mg by mouth daily.    [provider]  empagliflozin (JARDIANCE) 10 MG TABS tablet TAKE 1 TABLET BY MOUTH EVERY DAY BEFORE BREAKFAST 12/23/22   Pricilla Riffle, MD  enoxaparin (LOVENOX) 100 MG/ML injection Inject 0.95 mLs (95 mg total) into the skin every 12 (twelve) hours. 09/23/22   Carnella Guadalajara, PA-C  furosemide (LASIX) 20 MG tablet TAKE 3 TABLETS BY MOUTH EVERY DAY 10/02/20   Pricilla Riffle, MD  hydrALAZINE (APRESOLINE) 50 MG tablet TAKE 1 TABLET BY MOUTH THREE  TIMES A DAY 10/16/22   Pricilla Riffle, MD  isosorbide mononitrate (IMDUR) 60 MG 24 hr tablet Take 1 tablet (60 mg total) by mouth daily. 09/12/20 12/31/22  Pricilla Riffle, MD  methocarbamol (ROBAXIN) 500 MG tablet Take 1 tablet (500 mg total) by mouth 2 (two) times daily as needed for muscle spasms. 11/29/22   Fayrene Helper, PA-C  metoprolol succinate (TOPROL-XL) 50 MG 24 hr tablet Take 50 mg by mouth daily. 04/27/17   [provider]  nitroGLYCERIN (NITROSTAT) 0.4 MG SL tablet Place 1 tablet (0.4 mg total) under the tongue every 5 (five) minutes as needed for chest pain. up to 3 doses. 07/05/13   Jodelle Gross, NP  spironolactone (ALDACTONE) 25 MG tablet TAKE 1 TABLET (25 MG TOTAL) BY MOUTH DAILY. 08/15/22 08/10/23  Ellsworth Lennox, PA-C      Allergies    Lisinopril    Review of Systems   Review of Systems  Neurological:  Positive for numbness. Negative for weakness and headaches.    Physical Exam Updated Vital Signs BP (!) 147/92   Pulse 74   Ht 5\' 6"  (1.676 m)   SpO2 91%   BMI 33.25 kg/m  Physical Exam Vitals and nursing note reviewed.  Constitutional:      Appearance: He is well-developed.  HENT:     Head:  Normocephalic and atraumatic.  Eyes:     Extraocular Movements: Extraocular movements intact.  Cardiovascular:     Rate and Rhythm: Normal rate and regular rhythm.     Heart sounds: Normal heart sounds.  Pulmonary:     Effort: Pulmonary effort is normal.     Breath sounds: Normal breath sounds.  Abdominal:     Palpations: Abdomen is soft.     Tenderness: There is no abdominal tenderness.  Skin:    General: Skin is warm and dry.  Neurological:     Mental Status: He is alert.     Comments: CN 3-12 grossly intact. 5/5 strength in all 4 extremities. Grossly normal sensation to the right face, right leg and left face, leg, arm.  There is some subjective decrease sensation along his right shoulder but he states that is chronic. Normal finger to nose.      ED  Results / Procedures / Treatments   Labs (all labs ordered are listed, but only abnormal results are displayed) Labs Reviewed  CBG MONITORING, ED - Abnormal; Notable for the following components:      Result Value   Glucose-Capillary 153 (*)    All other components within normal limits  ETHANOL  PROTIME-INR  APTT  CBC  DIFFERENTIAL  COMPREHENSIVE METABOLIC PANEL  RAPID URINE DRUG SCREEN, HOSP PERFORMED  URINALYSIS, ROUTINE W REFLEX MICROSCOPIC  I-STAT CHEM 8, ED    EKG None  Radiology No results found.  Procedures Procedures    Medications Ordered in ED Medications - No data to display  ED Course/ Medical Decision Making/ A&P Clinical Course as of 01/26/23 1056  Mon Jan 26, 2023  1046 I rapidly assessed the patient at the bedside.  As in my HPI, he is a difficult historian to try and get a clear last known well.  Given he cannot tell me what time this started besides an hour after waking up at 530 then I would have to assume it is at 630 and is now nearly 11:00.  His symptoms are so mild and not even reproducible on his face, that I do not think this would be a code stroke candidate as it is neither an LVO nor severe enough for TNK.  Will proceed with stroke workup. [SG]    Clinical Course User Index [SG] Pricilla Loveless, MD                                 Medical Decision Making Amount and/or Complexity of Data Reviewed Labs: ordered.    Details: Unremarkable electrolytes. Radiology: ordered and independent interpretation performed.    Details: No head bleed ECG/medicine tests: ordered and independent interpretation performed.    Details: No anemia   As above, patient not called a code stroke.  Continues to have some vague numbness symptoms.  Discussed with Dr. Selina Cooley, given he is having continuous symptoms it would be reasonable to get MRI and if this is negative can discharge.  MRI currently pending.  Care transferred to Dr. Estell Harpin.        Final Clinical  Impression(s) / ED Diagnoses Final diagnoses:  None    Rx / DC Orders ED Discharge Orders     None         Pricilla Loveless, MD 01/26/23 1534

## 2023-02-28 ENCOUNTER — Observation Stay (HOSPITAL_BASED_OUTPATIENT_CLINIC_OR_DEPARTMENT_OTHER): Payer: 59

## 2023-02-28 ENCOUNTER — Other Ambulatory Visit (HOSPITAL_COMMUNITY): Payer: 59

## 2023-02-28 ENCOUNTER — Other Ambulatory Visit (HOSPITAL_COMMUNITY): Payer: Self-pay | Admitting: *Deleted

## 2023-02-28 ENCOUNTER — Observation Stay (HOSPITAL_COMMUNITY)
Admission: EM | Admit: 2023-02-28 | Discharge: 2023-03-01 | Disposition: A | Discharge status: Home / Self Care | Payer: 59 | Attending: Internal Medicine | Admitting: Internal Medicine

## 2023-02-28 ENCOUNTER — Other Ambulatory Visit: Payer: Self-pay

## 2023-02-28 ENCOUNTER — Encounter (HOSPITAL_COMMUNITY): Payer: Self-pay

## 2023-02-28 ENCOUNTER — Observation Stay (HOSPITAL_COMMUNITY): Payer: 59

## 2023-02-28 ENCOUNTER — Emergency Department (HOSPITAL_COMMUNITY): Payer: 59

## 2023-02-28 DIAGNOSIS — R739 Hyperglycemia, unspecified: Secondary | ICD-10-CM

## 2023-02-28 DIAGNOSIS — I10 Essential (primary) hypertension: Secondary | ICD-10-CM

## 2023-02-28 DIAGNOSIS — Z955 Presence of coronary angioplasty implant and graft: Secondary | ICD-10-CM | POA: Insufficient documentation

## 2023-02-28 DIAGNOSIS — Z8673 Personal history of transient ischemic attack (TIA), and cerebral infarction without residual deficits: Secondary | ICD-10-CM

## 2023-02-28 DIAGNOSIS — N2889 Other specified disorders of kidney and ureter: Secondary | ICD-10-CM | POA: Diagnosis not present

## 2023-02-28 DIAGNOSIS — Z79899 Other long term (current) drug therapy: Secondary | ICD-10-CM | POA: Diagnosis not present

## 2023-02-28 DIAGNOSIS — Z7901 Long term (current) use of anticoagulants: Secondary | ICD-10-CM | POA: Insufficient documentation

## 2023-02-28 DIAGNOSIS — Z87891 Personal history of nicotine dependence: Secondary | ICD-10-CM | POA: Insufficient documentation

## 2023-02-28 DIAGNOSIS — I5042 Chronic combined systolic (congestive) and diastolic (congestive) heart failure: Secondary | ICD-10-CM | POA: Insufficient documentation

## 2023-02-28 DIAGNOSIS — N1831 Chronic kidney disease, stage 3a: Secondary | ICD-10-CM | POA: Diagnosis present

## 2023-02-28 DIAGNOSIS — Z86711 Personal history of pulmonary embolism: Secondary | ICD-10-CM | POA: Diagnosis present

## 2023-02-28 DIAGNOSIS — Z7984 Long term (current) use of oral hypoglycemic drugs: Secondary | ICD-10-CM | POA: Diagnosis not present

## 2023-02-28 DIAGNOSIS — R200 Anesthesia of skin: Principal | ICD-10-CM | POA: Diagnosis present

## 2023-02-28 DIAGNOSIS — R7302 Impaired glucose tolerance (oral): Secondary | ICD-10-CM | POA: Insufficient documentation

## 2023-02-28 DIAGNOSIS — I130 Hypertensive heart and chronic kidney disease with heart failure and stage 1 through stage 4 chronic kidney disease, or unspecified chronic kidney disease: Secondary | ICD-10-CM | POA: Diagnosis not present

## 2023-02-28 DIAGNOSIS — I5022 Chronic systolic (congestive) heart failure: Secondary | ICD-10-CM | POA: Diagnosis present

## 2023-02-28 DIAGNOSIS — Z7902 Long term (current) use of antithrombotics/antiplatelets: Secondary | ICD-10-CM | POA: Diagnosis not present

## 2023-02-28 DIAGNOSIS — N289 Disorder of kidney and ureter, unspecified: Secondary | ICD-10-CM

## 2023-02-28 DIAGNOSIS — I2510 Atherosclerotic heart disease of native coronary artery without angina pectoris: Secondary | ICD-10-CM | POA: Diagnosis not present

## 2023-02-28 DIAGNOSIS — Z951 Presence of aortocoronary bypass graft: Secondary | ICD-10-CM | POA: Insufficient documentation

## 2023-02-28 LAB — DIFFERENTIAL
Abs Immature Granulocytes: 0.01 10*3/uL (ref 0.00–0.07)
Basophils Absolute: 0.1 10*3/uL (ref 0.0–0.1)
Basophils Relative: 1 %
Eosinophils Absolute: 0.1 10*3/uL (ref 0.0–0.5)
Eosinophils Relative: 2 %
Immature Granulocytes: 0 %
Lymphocytes Relative: 44 %
Lymphs Abs: 2.5 10*3/uL (ref 0.7–4.0)
Monocytes Absolute: 0.7 10*3/uL (ref 0.1–1.0)
Monocytes Relative: 11 %
Neutro Abs: 2.5 10*3/uL (ref 1.7–7.7)
Neutrophils Relative %: 42 %

## 2023-02-28 LAB — CBC
HCT: 49.8 % (ref 39.0–52.0)
HCT: 51.5 % (ref 39.0–52.0)
Hemoglobin: 15.9 g/dL (ref 13.0–17.0)
Hemoglobin: 16.1 g/dL (ref 13.0–17.0)
MCH: 29.2 pg (ref 26.0–34.0)
MCH: 28.2 pg (ref 26.0–34.0)
MCHC: 31.9 g/dL (ref 30.0–36.0)
MCHC: 31.3 g/dL (ref 30.0–36.0)
MCV: 91.5 fL (ref 80.0–100.0)
MCV: 90.2 fL (ref 80.0–100.0)
Platelets: 148 10*3/uL — ABNORMAL LOW (ref 150–400)
Platelets: 152 10*3/uL (ref 150–400)
RBC: 5.44 MIL/uL (ref 4.22–5.81)
RBC: 5.71 MIL/uL (ref 4.22–5.81)
RDW: 13.5 % (ref 11.5–15.5)
RDW: 13.2 % (ref 11.5–15.5)
WBC: 5.9 10*3/uL (ref 4.0–10.5)
WBC: 6.1 10*3/uL (ref 4.0–10.5)
nRBC: 0 % (ref 0.0–0.2)
nRBC: 0 % (ref 0.0–0.2)

## 2023-02-28 LAB — ECHOCARDIOGRAM COMPLETE
Area-P 1/2: 3.48 cm2
Height: 66 in
S' Lateral: 4 cm
Weight: 3280.44 [oz_av]

## 2023-02-28 LAB — I-STAT CHEM 8, ED
BUN: 17 mg/dL (ref 8–23)
Calcium, Ion: 1.2 mmol/L (ref 1.15–1.40)
Chloride: 99 mmol/L (ref 98–111)
Creatinine, Ser: 1.7 mg/dL — ABNORMAL HIGH (ref 0.61–1.24)
Glucose, Bld: 106 mg/dL — ABNORMAL HIGH (ref 70–99)
HCT: 52 % (ref 39.0–52.0)
Hemoglobin: 17.7 g/dL — ABNORMAL HIGH (ref 13.0–17.0)
Potassium: 3.9 mmol/L (ref 3.5–5.1)
Sodium: 141 mmol/L (ref 135–145)
TCO2: 27 mmol/L (ref 22–32)

## 2023-02-28 LAB — BASIC METABOLIC PANEL
Anion gap: 13 (ref 5–15)
BUN: 15 mg/dL (ref 8–23)
CO2: 23 mmol/L (ref 22–32)
Calcium: 9.1 mg/dL (ref 8.9–10.3)
Chloride: 102 mmol/L (ref 98–111)
Creatinine, Ser: 1.34 mg/dL — ABNORMAL HIGH (ref 0.61–1.24)
GFR, Estimated: 55 mL/min — ABNORMAL LOW (ref >60)
Glucose, Bld: 98 mg/dL (ref 70–99)
Potassium: 3.8 mmol/L (ref 3.5–5.1)
Sodium: 138 mmol/L (ref 135–145)

## 2023-02-28 LAB — GLUCOSE, CAPILLARY: Glucose-Capillary: 98 mg/dL (ref 70–99)

## 2023-02-28 LAB — COMPREHENSIVE METABOLIC PANEL
ALT: 37 U/L (ref 0–44)
AST: 27 U/L (ref 15–41)
Albumin: 4 g/dL (ref 3.5–5.0)
Alkaline Phosphatase: 50 U/L (ref 38–126)
Anion gap: 7 (ref 5–15)
BUN: 16 mg/dL (ref 8–23)
CO2: 26 mmol/L (ref 22–32)
Calcium: 9 mg/dL (ref 8.9–10.3)
Chloride: 105 mmol/L (ref 98–111)
Creatinine, Ser: 1.47 mg/dL — ABNORMAL HIGH (ref 0.61–1.24)
GFR, Estimated: 49 mL/min — ABNORMAL LOW (ref >60)
Glucose, Bld: 112 mg/dL — ABNORMAL HIGH (ref 70–99)
Potassium: 3.8 mmol/L (ref 3.5–5.1)
Sodium: 138 mmol/L (ref 135–145)
Total Bilirubin: 1.3 mg/dL — ABNORMAL HIGH (ref <1.2)
Total Protein: 7.2 g/dL (ref 6.5–8.1)

## 2023-02-28 LAB — LIPID PANEL
Cholesterol: 77 mg/dL (ref 0–200)
HDL: 40 mg/dL — ABNORMAL LOW (ref >40)
LDL Cholesterol: 26 mg/dL (ref 0–99)
Total CHOL/HDL Ratio: 1.9 {ratio}
Triglycerides: 53 mg/dL (ref <150)
VLDL: 11 mg/dL (ref 0–40)

## 2023-02-28 LAB — PROTIME-INR
INR: 1 (ref 0.8–1.2)
Prothrombin Time: 13.6 s (ref 11.4–15.2)

## 2023-02-28 LAB — HEMOGLOBIN A1C
Hgb A1c MFr Bld: 5.5 % (ref 4.8–5.6)
Mean Plasma Glucose: 111.15 mg/dL

## 2023-02-28 LAB — ETHANOL: Alcohol, Ethyl (B): <10 mg/dL (ref <10)

## 2023-02-28 LAB — APTT: aPTT: 31 s (ref 24–36)

## 2023-02-28 LAB — CBG MONITORING, ED: Glucose-Capillary: 126 mg/dL — ABNORMAL HIGH (ref 70–99)

## 2023-02-28 LAB — HEPARIN LEVEL (UNFRACTIONATED): Heparin Unfractionated: 0.37 [IU]/mL (ref 0.30–0.70)

## 2023-02-28 MED ORDER — IOHEXOL 350 MG/ML SOLN
75.0000 mL | Freq: Once | INTRAVENOUS | Status: AC | PRN
  Administered 2023-02-28: 75 mL via INTRAVENOUS

## 2023-02-28 MED ORDER — ACETAMINOPHEN 160 MG/5ML PO SOLN: 650.0000 mg | ORAL | Status: DC | PRN

## 2023-02-28 MED ORDER — FUROSEMIDE 40 MG PO TABS
60.0000 mg | ORAL_TABLET | Freq: Every day | ORAL | Status: DC
  Administered 2023-02-28 – 2023-03-01 (×2): 60 mg via ORAL
  Filled 2023-02-28 (×2): qty 2

## 2023-02-28 MED ORDER — CLOPIDOGREL BISULFATE 75 MG PO TABS
75.0000 mg | ORAL_TABLET | Freq: Every day | ORAL | Status: DC
  Administered 2023-02-28 – 2023-03-01 (×2): 75 mg via ORAL
  Filled 2023-02-28 (×2): qty 1

## 2023-02-28 MED ORDER — ENOXAPARIN SODIUM 100 MG/ML IJ SOSY
1.0000 mg/kg | PREFILLED_SYRINGE | Freq: Two times a day (BID) | INTRAMUSCULAR | Status: DC
Start: 2023-02-28 — End: 2023-02-28

## 2023-02-28 MED ORDER — HEPARIN (PORCINE) 25000 UT/250ML-% IV SOLN
1000.0000 [IU]/h | INTRAVENOUS | Status: DC
  Administered 2023-02-28: 1000 [IU]/h via INTRAVENOUS
  Filled 2023-02-28: qty 250

## 2023-02-28 MED ORDER — ATORVASTATIN CALCIUM 40 MG PO TABS
80.0000 mg | ORAL_TABLET | Freq: Every day | ORAL | Status: DC
  Administered 2023-02-28: 80 mg via ORAL
  Filled 2023-02-28: qty 2

## 2023-02-28 MED ORDER — ENOXAPARIN SODIUM 100 MG/ML IJ SOSY
1.0000 mg/kg | PREFILLED_SYRINGE | Freq: Once | INTRAMUSCULAR | Status: AC
  Administered 2023-02-28: 95 mg via SUBCUTANEOUS
  Filled 2023-02-28: qty 1

## 2023-02-28 MED ORDER — STROKE: EARLY STAGES OF RECOVERY BOOK
Freq: Once | Status: AC
  Filled 2023-02-28: qty 1

## 2023-02-28 MED ORDER — ACETAMINOPHEN 325 MG PO TABS: 650.0000 mg | ORAL_TABLET | ORAL | Status: DC | PRN

## 2023-02-28 MED ORDER — PERFLUTREN LIPID MICROSPHERE
1.0000 mL | INTRAVENOUS | Status: AC | PRN
  Administered 2023-02-28: 6 mL via INTRAVENOUS

## 2023-02-28 MED ORDER — SENNOSIDES-DOCUSATE SODIUM 8.6-50 MG PO TABS
1.0000 | ORAL_TABLET | Freq: Every evening | ORAL | Status: DC | PRN
Start: 2023-02-28 — End: 2023-03-01

## 2023-02-28 MED ORDER — SPIRONOLACTONE 25 MG PO TABS
25.0000 mg | ORAL_TABLET | Freq: Every day | ORAL | Status: DC
  Administered 2023-02-28 – 2023-03-01 (×2): 25 mg via ORAL
  Filled 2023-02-28 (×2): qty 1

## 2023-02-28 MED ORDER — ACETAMINOPHEN 650 MG RE SUPP: 650.0000 mg | RECTAL | Status: DC | PRN

## 2023-02-28 NOTE — H&P (Signed)
History and Physical    [name] ZOX:[id number] DOB: [birth year] DOA: 02/28/2023  PCP: Benetta Spar, MD   Patient coming from: Home   Chief Complaint: Left facial numbness   HPI: [name] is a 77 y.o. male with medical history significant for hypertension, hyperlipidemia, CAD, chronic HFmrEF, CVA, and PE on Lovenox after failing Eliquis and warfarin who now presents with left facial numbness.  Patient reports that he was in his usual state of health last night when he was watching TV.  He fell asleep at approximately 10 PM and when he woke up, the left side of his face was numb.  He denies any other acute numbness or weakness.  Symptoms persisted and he eventually called EMS.  He denies any recent chest pain, palpitations, fever, or chills.  ED Course: Upon arrival to the ED, patient is found to be afebrile and saturating well on room air with normal heart rate and stable blood pressure.  Labs are most notable for creatinine 1.47.  Head CT is negative for acute findings.  EKG demonstrates sinus rhythm.    Patient was evaluated by teleneurology who recommended admission for TIA/CVA workup.  Review of Systems:  All other systems reviewed and apart from HPI, are negative.  Past Medical History:  Diagnosis Date   Apical mural thrombus    Arteriosclerotic cardiovascular disease (ASCVD)    a. s/p CABG in 2011 b. DES to RCA in 08/2017 c. cath in 06/2018 showing patent LIMA-LAD, SVG-OM1-OM2 and SVG-D1   Chronic anticoagulation    Chronic systolic CHF (congestive heart failure) (HCC)    a. EF 45-50% in 06/2012 b. 35-40% in 08/2017 with similar results in 06/2018 c. EF at 30-35% in 10/2020   CVA (cerebral vascular accident) (HCC)    HTN (hypertension)    Hyperlipidemia    Keloid    median sternotomy   Pulmonary embolism (HCC) 03/2010   Substance abuse (HCC)    formerly cocaine    Past Surgical History:  Procedure Laterality Date   COLONOSCOPY N/A 01/22/2017    Procedure: COLONOSCOPY;  Surgeon: West Bali, MD;  Location: [facility];  Service: Endoscopy;  Laterality: N/A;  200   CORONARY ARTERY BYPASS GRAFT  03/18/2010   LIMA-LAD, SVG to diagonal, OM1 & OM2   CORONARY STENT INTERVENTION N/A 08/21/2017   Procedure: CORONARY STENT INTERVENTION;  Surgeon: Tonny Bollman, MD;  Location: [facility] INVASIVE CV LAB;  Service: Cardiovascular;  Laterality: N/A;   LEFT HEART CATH AND CORS/GRAFTS ANGIOGRAPHY N/A 08/21/2017   Procedure: LEFT HEART CATH AND CORS/GRAFTS ANGIOGRAPHY;  Surgeon: Tonny Bollman, MD;  Location: [facility] INVASIVE CV LAB;  Service: Cardiovascular;  Laterality: N/A;   LEFT HEART CATH AND CORS/GRAFTS ANGIOGRAPHY N/A 06/21/2018   Procedure: LEFT HEART CATH AND CORS/GRAFTS ANGIOGRAPHY;  Surgeon: Yvonne Kendall, MD;  Location: [facility] INVASIVE CV LAB;  Service: Cardiovascular;  Laterality: N/A;   POLYPECTOMY  01/22/2017   Procedure: POLYPECTOMY;  Surgeon: West Bali, MD;  Location: [facility];  Service: Endoscopy;;  Transverse(CS) and sigmoid colon(HS)   PTCA  06/1996   LAD & RCA   TEE WITHOUT CARDIOVERSION N/A 06/10/2012   Procedure: TRANSESOPHAGEAL ECHOCARDIOGRAM (TEE);  Surgeon: Wendall Stade, MD;  Location: [facility];  Service: Cardiovascular;  Laterality: N/A;    Social History:   reports that he quit smoking about 20 years ago. His smoking use included cigarettes. He has never used smokeless tobacco. He reports that he does not currently use alcohol.  He reports that he does not use drugs.  Allergies  Allergen Reactions   Lisinopril Swelling and Other (See Comments)    Mouth and tongue swells    Family History  Problem Relation Age of Onset   Cancer Mother 74   Stroke Daughter      Prior to Admission medications   Medication Sig Start Date End Date Taking? Authorizing Provider  acetaminophen (TYLENOL) 500 MG tablet Take 2 tablets (1,000 mg total) by mouth every 6 (six) hours as needed for moderate pain. 11/29/22   Fayrene Helper,  PA-C  atorvastatin (LIPITOR) 40 MG tablet Take 1 tablet (40 mg total) by mouth daily at 6 PM. 03/09/17   Calvert Cantor, MD  clopidogrel (PLAVIX) 75 MG tablet Take 75 mg by mouth daily.    [provider]  empagliflozin (JARDIANCE) 10 MG TABS tablet TAKE 1 TABLET BY MOUTH EVERY DAY BEFORE BREAKFAST 12/23/22   Pricilla Riffle, MD  enoxaparin (LOVENOX) 100 MG/ML injection Inject 0.95 mLs (95 mg total) into the skin every 12 (twelve) hours. 09/23/22   Carnella Guadalajara, PA-C  furosemide (LASIX) 20 MG tablet TAKE 3 TABLETS BY MOUTH EVERY DAY 10/02/20   Pricilla Riffle, MD  hydrALAZINE (APRESOLINE) 50 MG tablet TAKE 1 TABLET BY MOUTH THREE TIMES A DAY 10/16/22   Pricilla Riffle, MD  isosorbide mononitrate (IMDUR) 60 MG 24 hr tablet Take 1 tablet (60 mg total) by mouth daily. 09/12/20 12/31/22  Pricilla Riffle, MD  methocarbamol (ROBAXIN) 500 MG tablet Take 1 tablet (500 mg total) by mouth 2 (two) times daily as needed for muscle spasms. 11/29/22   Fayrene Helper, PA-C  metoprolol succinate (TOPROL-XL) 50 MG 24 hr tablet Take 50 mg by mouth daily. 04/27/17   [provider]  nitroGLYCERIN (NITROSTAT) 0.4 MG SL tablet Place 1 tablet (0.4 mg total) under the tongue every 5 (five) minutes as needed for chest pain. up to 3 doses. 07/05/13   Jodelle Gross, NP  spironolactone (ALDACTONE) 25 MG tablet TAKE 1 TABLET (25 MG TOTAL) BY MOUTH DAILY. 08/15/22 08/10/23  Ellsworth Lennox, PA-C    Physical Exam: Vitals:   02/28/23 0300 02/28/23 0330 02/28/23 0358 02/28/23 0401  BP: 118/65 (!) 141/66  127/85  Pulse:  (!) 59  63  Resp: 11 20  16   Temp:  97.9 F (36.6 C)  (!) 97.3 F (36.3 C)  TempSrc:  Oral  Oral  SpO2:  96%  98%  Weight:   93 kg   Height:   5\' 6"  (1.676 m)     Constitutional: NAD, calm  Eyes: PERTLA, lids and conjunctivae normal ENMT: Mucous membranes are moist. Posterior pharynx clear of any exudate or lesions.   Neck: supple, no masses  Respiratory: no wheezing, no crackles. No  accessory muscle use.  Cardiovascular: S1 & S2 heard, regular rate and rhythm. No extremity edema.   Abdomen: No distension, no tenderness, soft. Bowel sounds active.  Musculoskeletal: no clubbing / cyanosis. No joint deformity upper and lower extremities.   Skin: no significant rashes, lesions, ulcers. Warm, dry, well-perfused. Neurologic: CN 2-12 grossly intact. Sensation to light touch diminished in left face. Strength 5/5 in all 4 limbs. Alert and oriented.  Psychiatric: Pleasant. Cooperative.    Labs and Imaging on Admission: I have personally reviewed following labs and imaging studies  CBC: Recent Labs  Lab 02/28/23 0211 02/28/23 0214  WBC 5.9  --   NEUTROABS 2.5  --   HGB 15.9  17.7*  HCT 49.8 52.0  MCV 91.5  --   PLT 148*  --    Basic Metabolic Panel: Recent Labs  Lab 02/28/23 0211 02/28/23 0214  NA 138 141  K 3.8 3.9  CL 105 99  CO2 26  --   GLUCOSE 112* 106*  BUN 16 17  CREATININE 1.47* 1.70*  CALCIUM 9.0  --    GFR: Estimated Creatinine Clearance: 39.5 mL/min (A) (by C-G formula based on SCr of 1.7 mg/dL (H)). Liver Function Tests: Recent Labs  Lab 02/28/23 0211  AST 27  ALT 37  ALKPHOS 50  BILITOT 1.3*  PROT 7.2  ALBUMIN 4.0   No results for input(s): "LIPASE", "AMYLASE" in the last 168 hours. No results for input(s): "AMMONIA" in the last 168 hours. Coagulation Profile: Recent Labs  Lab 02/28/23 0211  INR 1.0   Cardiac Enzymes: No results for input(s): "CKTOTAL", "CKMB", "CKMBINDEX", "TROPONINI" in the last 168 hours. BNP (last 3 results) No results for input(s): "PROBNP" in the last 8760 hours. HbA1C: No results for input(s): "HGBA1C" in the last 72 hours. CBG: Recent Labs  Lab 02/28/23 0215  GLUCAP 126*   Lipid Profile: No results for input(s): "CHOL", "HDL", "LDLCALC", "TRIG", "CHOLHDL", "LDLDIRECT" in the last 72 hours. Thyroid Function Tests: No results for input(s): "TSH", "T4TOTAL", "FREET4", "T3FREE", "THYROIDAB" in the  last 72 hours. Anemia Panel: No results for input(s): "VITAMINB12", "FOLATE", "FERRITIN", "TIBC", "IRON", "RETICCTPCT" in the last 72 hours. Urine analysis:    Component Value Date/Time   COLORURINE STRAW (A) 01/26/2023 1047   APPEARANCEUR CLEAR 01/26/2023 1047   LABSPEC 1.009 01/26/2023 1047   PHURINE 5.0 01/26/2023 1047   GLUCOSEU >=500 (A) 01/26/2023 1047   HGBUR NEGATIVE 01/26/2023 1047   BILIRUBINUR NEGATIVE 01/26/2023 1047   KETONESUR NEGATIVE 01/26/2023 1047   PROTEINUR NEGATIVE 01/26/2023 1047   UROBILINOGEN 1.0 03/16/2010 0652   NITRITE NEGATIVE 01/26/2023 1047   LEUKOCYTESUR NEGATIVE 01/26/2023 1047   Sepsis Labs: @LABRCNTIP (procalcitonin:4,lacticidven:4) )No results found for this or any previous visit (from the past 240 hour(s)).   Radiological Exams on Admission: CT HEAD CODE STROKE WO CONTRAST  Result Date: 02/28/2023 CLINICAL DATA:  Code stroke.  Left-sided facial numbness EXAM: CT HEAD WITHOUT CONTRAST TECHNIQUE: Contiguous axial images were obtained from the base of the skull through the vertex without intravenous contrast. RADIATION DOSE REDUCTION: This exam was performed according to the departmental dose-optimization program which includes automated exposure control, adjustment of the mA and/or kV according to patient size and/or use of iterative reconstruction technique. COMPARISON:  01/26/2023 FINDINGS: Brain: No evidence of acute infarction, hemorrhage, mass, mass effect, or midline shift. No hydrocephalus or extra-axial collection. Encephalomalacia in the left frontal lobe, left occipital lobe, and right cerebellum from remote infarcts. Additional remote lacunar infarcts in the left thalamus and bilateral basal ganglia. Periventricular white matter changes, likely the sequela of chronic small vessel ischemic disease. Vascular: No hyperdense vessel. Atherosclerotic calcifications in the intracranial carotid and vertebral arteries. Skull: Negative for fracture or  focal lesion. Sinuses/Orbits: Mild mucosal thickening in the paranasal sinuses. No acute finding in the orbits. Other: The mastoid air cells are well aerated. ASPECTS [facility] Stroke Program Early CT Score) - Ganglionic level infarction (caudate, lentiform nuclei, internal capsule, insula, M1-M3 cortex): 7 - Supraganglionic infarction (M4-M6 cortex): 3 Total score (0-10 with 10 being normal): 10 IMPRESSION: No acute intracranial process. ASPECTS is 10. Code stroke imaging results were communicated on 02/28/2023 at 2:30 am to provider [facility] via telephone, who verbally  acknowledged these results. Electronically Signed   By: Wiliam Ke M.D.   On: 02/28/2023 02:30    EKG: Independently reviewed. Sinus rhythm, PVCs, incomplete RBBB.   Assessment/Plan   1. Left facial numbness; hx of CVA  - Continue cardiac monitoring and frequent neuro checks, check A1c, lipids, MRI brain, echocardiogram, and CTA head & neck, increase Lipitor to 80 mg, consult PT/OT/SLP    2. Hx of PE  - Failed Eliquis and warfarin, now on Lovenox   - Continue Lovenox   3. Chronic HFmrEF  - Appears compensated  - Continue diuretics, monitor volume status    4. CAD  - No anginal complaints  - Continue Lipitor and Plavix   5. CKD 3A  - Appears close to baseline  - Renally-dose medications   6. HTN  - Permit HTN to 220 systolic for 24 hrs per neurology recommendations     DVT prophylaxis: Treatment-dose Lovenox  Code Status: Full  Level of Care: Level of care: Telemetry Family Communication: none present  Disposition Plan:  Patient is from: Home  Anticipated d/c is to: Home  Anticipated d/c date is: 03/01/23  Patient currently: Pending TIA/CVA workup  Consults called: Routine Neurology consultation requested via Epic order  Admission status: Observation     Briscoe Deutscher, MD [facility]  02/28/2023, 4:57 AM

## 2023-02-28 NOTE — Consult Note (Signed)
[facility] Tele[facility]urology Consult Services   Patient Name:   [name], [name] Date of Birth:   12-24-1945 Date of Service:   02/28/2023 02:23:35  Diagnosis:       I63.89 - Cerebrovascular accident (CVA) due to other mechanism [facility]  Impression:      77 years old male whom the stroke code is called for left facial numbness.  - NIHSS 1  - CTH: chronic left frontal, bilateral occipital and right cerebellar strokes    Given the clinical history and examination, presentation ddx includes acute stroke    Recommendations:  - Admit with Q4h neurochecks and telemetry    - Obtain MRI brain w/o  - Obtain CTA head and neck w. contrast  - Obtain TTE  - Obtain A1C, lipid panel    - Okay to continue home lovenox  - Start Lipitor 80 mg daily    - BP permissive for the first 48 hours <220  - Glucose permissive for the first 48 hours 140-180    - PT/OT/ST    - If any neuro change, please contact neurology/primary team immediately    - Neurology to follow    ------------------------------------------------------------------------------  Advanced Imaging: Advanced Imaging Deferred because:  Non-disabling symptoms as verified by the patient; no cortical signs so not consistent with LVO   Metrics: Last Known Well: 02/27/2023 20:00:00 Dispatch Time: 02/28/2023 02:23:35 Arrival Time: 02/28/2023 01:28:00 Initial Response Time: 02/28/2023 02:28:57 Symptoms: left facial numbness. Initial patient interaction: 02/28/2023 02:31:10 NIHSS Assessment Completed: 02/28/2023 02:32:34 Patient is not a candidate for Thrombolytic. Thrombolytic Medical Decision: 02/28/2023 02:31:51 Patient was not deemed candidate for Thrombolytic because of following reasons: [name] outside 4.5 hr window. .  I personally Reviewed the CT Head and it Showed chronic left frontal, bilateral occipital and right cerebellar strokes  Primary Provider Notified of Diagnostic Impression and Management  Plan on: 02/28/2023 02:44:19    ------------------------------------------------------------------------------  History of Present Illness: Patient is a 77 year old Male.  Patient was brought by EMS for symptoms of left facial numbness. 77 years old male whom the stroke code is called for left facial numbness.  Patient states that he went to bed at his normal state of health at around 8 PM and woke up in the middle of the night to notice that his left face was numb. Denies change in vision/hearing/speech, weakness, headache.  On presentation, hemodynamically stable. NIHSS 1 for left face numbness. CTH showing chronic left frontal, bilateral occipital, right cerebellar strokes.   Past Medical History:      Stroke  Medications:  Anticoagulant use:  Yes lovenox No Antiplatelet use Reviewed EMR for current medications  Allergies:  Reviewed  Social History: Patient Is: Single Smoking: No Alcohol Use: No Drug Use: No  Family History:  There is no family history of premature cerebrovascular disease pertinent to this consultation  ROS : 14 Points Review of Systems was performed and was negative except mentioned in HPI.  Past Surgical History: There Is No Surgical History Contributory To Today's Visit    Examination: BP(146/88), Pulse(70), 1A: Level of Consciousness - Alert; keenly responsive + 0 1B: Ask Month and Age - Both Questions Right + 0 1C: Blink Eyes & Squeeze Hands - Performs Both Tasks + 0 2: Test Horizontal Extraocular Movements - Normal + 0 3: Test Visual Fields - No Visual Loss + 0 4: Test Facial Palsy (Use Grimace if Obtunded) - Normal symmetry + 0 5A: Test Left Arm Motor Drift - No Drift for 10 Seconds +  0 5B: Test Right Arm Motor Drift - No Drift for 10 Seconds + 0 6A: Test Left Leg Motor Drift - No Drift for 5 Seconds + 0 6B: Test Right Leg Motor Drift - No Drift for 5 Seconds + 0 7: Test Limb Ataxia (FNF/Heel-Shin) - No Ataxia + 0 8: Test  Sensation - Mild-Moderate Loss: Less Sharp/More Dull + 1 9: Test Language/Aphasia - Normal; No aphasia + 0 10: Test Dysarthria - Normal + 0 11: Test Extinction/Inattention - No abnormality + 0  NIHSS Score: 1   Pre-Morbid Modified Rankin Scale: 0 Points = No symptoms at all  Spoke with : Dr. Preston Fleeting  This consult was conducted in real time using interactive audio and Immunologist. Patient was informed of the technology being used for this visit and agreed to proceed. Patient located in hospital and provider located at home/office setting.   Patient is being evaluated for possible acute neurologic impairment and high probability of imminent or life-threatening deterioration. I spent total of 44 minutes providing care to this patient, including time for face to face visit via telemedicine, review of medical records, imaging studies and discussion of findings with providers, the patient and/or family.   Dr Corinna Capra   [facility] For Inpatient follow-up with [facility] physician please call [facility] at [phone]. As we are not an outpatient service for any post hospital discharge needs please contact the hospital for assistance. If you have any questions for the [facility] physicians or need to reconsult for clinical or diagnostic changes please contact us via [facility] at [phone].

## 2023-02-28 NOTE — Progress Notes (Signed)
*  PRELIMINARY RESULTS* Echocardiogram 2D Echocardiogram has been performed with Definity.  Stacey Drain 02/28/2023, 11:28 AM

## 2023-02-28 NOTE — ED Provider Notes (Signed)
[place] EMERGENCY DEPARTMENT AT [facility] Provider Note   CSN: [id number] Arrival date & time: 02/28/23  0128     History  Chief Complaint  Patient presents with   Facial Numbness    [name] is a 77 y.o. male.  The history is provided by the patient.  He has history of hypertension, heart failure, pulmonary embolism anticoagulated on enoxaparin, strokes and comes in because he woke up with numbness in the left side of his face.  Last known normal was then he went to sleep at about 10:30 PM.  He denies weakness other than some chronic problems he has had with his shoulders, no new weakness.  He denies any numbness anywhere else.  He denies any difficulty with speech and he denies a headache.  Of note, he states that he has not had his enoxaparin for about 3 days because his daughter was not available to give him the injection.   Home Medications Prior to Admission medications   Medication Sig Start Date End Date Taking? Authorizing Provider  acetaminophen (TYLENOL) 500 MG tablet Take 2 tablets (1,000 mg total) by mouth every 6 (six) hours as needed for moderate pain. 11/29/22   Fayrene Helper, PA-C  atorvastatin (LIPITOR) 40 MG tablet Take 1 tablet (40 mg total) by mouth daily at 6 PM. 03/09/17   Calvert Cantor, MD  clopidogrel (PLAVIX) 75 MG tablet Take 75 mg by mouth daily.    [provider]  empagliflozin (JARDIANCE) 10 MG TABS tablet TAKE 1 TABLET BY MOUTH EVERY DAY BEFORE BREAKFAST 12/23/22   Pricilla Riffle, MD  enoxaparin (LOVENOX) 100 MG/ML injection Inject 0.95 mLs (95 mg total) into the skin every 12 (twelve) hours. 09/23/22   Carnella Guadalajara, PA-C  furosemide (LASIX) 20 MG tablet TAKE 3 TABLETS BY MOUTH EVERY DAY 10/02/20   Pricilla Riffle, MD  hydrALAZINE (APRESOLINE) 50 MG tablet TAKE 1 TABLET BY MOUTH THREE TIMES A DAY 10/16/22   Pricilla Riffle, MD  isosorbide mononitrate (IMDUR) 60 MG 24 hr tablet Take 1 tablet (60 mg total) by mouth daily. 09/12/20  12/31/22  Pricilla Riffle, MD  methocarbamol (ROBAXIN) 500 MG tablet Take 1 tablet (500 mg total) by mouth 2 (two) times daily as needed for muscle spasms. 11/29/22   Fayrene Helper, PA-C  metoprolol succinate (TOPROL-XL) 50 MG 24 hr tablet Take 50 mg by mouth daily. 04/27/17   [provider]  nitroGLYCERIN (NITROSTAT) 0.4 MG SL tablet Place 1 tablet (0.4 mg total) under the tongue every 5 (five) minutes as needed for chest pain. up to 3 doses. 07/05/13   Jodelle Gross, NP  spironolactone (ALDACTONE) 25 MG tablet TAKE 1 TABLET (25 MG TOTAL) BY MOUTH DAILY. 08/15/22 08/10/23  Ellsworth Lennox, PA-C      Allergies    Lisinopril    Review of Systems   Review of Systems  All other systems reviewed and are negative.   Physical Exam Updated Vital Signs BP (!) 154/91   Pulse 72   Temp 97.9 F (36.6 C) (Oral)   Resp 18   Ht 5\' 6"  (1.676 m)   Wt 94.3 kg   SpO2 97%   BMI 33.57 kg/m  Physical Exam Vitals and nursing note reviewed.   77 year old male, resting comfortably and in no acute distress. Vital signs are significant for elevated blood pressure. Oxygen saturation is 97%, which is normal. Head is normocephalic and atraumatic. PERRLA, EOMI. Oropharynx is  clear.  There is no facial asymmetry. Neck is nontender and supple without adenopathy or JVD.  There are no carotid bruits. Back is nontender and there is no CVA tenderness. Lungs are clear without rales, wheezes, or rhonchi. Chest is nontender. Heart has regular rate and rhythm without murmur. Abdomen is soft, flat, nontender without masses or hepatosplenomegaly and peristalsis is normoactive. Extremities have no cyanosis or edema, full range of motion is present. Skin is warm and dry without rash. Neurologic: Awake and alert and oriented, speech is normal.  There is no facial asymmetry, tongue protrudes in the midline, no sensory deficits on the face.  Strength is 5/5 in all 4 extremities.  There is no pronator drift.   Sensory exam is normal in all 4 extremities.  ED Results / Procedures / Treatments   Labs (all labs ordered are listed, but only abnormal results are displayed) Labs Reviewed  CBC - Abnormal; Notable for the following components:      Result Value   Platelets 148 (*)    All other components within normal limits  COMPREHENSIVE METABOLIC PANEL - Abnormal; Notable for the following components:   Glucose, Bld 112 (*)    Creatinine, Ser 1.47 (*)    Total Bilirubin 1.3 (*)    GFR, Estimated 49 (*)    All other components within normal limits  I-STAT CHEM 8, ED - Abnormal; Notable for the following components:   Creatinine, Ser 1.70 (*)    Glucose, Bld 106 (*)    Hemoglobin 17.7 (*)    All other components within normal limits  CBG MONITORING, ED - Abnormal; Notable for the following components:   Glucose-Capillary 126 (*)    All other components within normal limits  ETHANOL  PROTIME-INR  APTT  DIFFERENTIAL  RAPID URINE DRUG SCREEN, HOSP PERFORMED  URINALYSIS, ROUTINE W REFLEX MICROSCOPIC    EKG EKG Interpretation Date/Time:  Saturday February 28 2023 02:45:09 EST Ventricular Rate:  73 PR Interval:  134 QRS Duration:  116 QT Interval:  476 QTC Calculation: 443 R Axis:   77  Text Interpretation: Sinus rhythm Multiform ventricular premature complexes Incomplete right bundle branch block Inferior infarct, old Anterolateral infarct, age indeterminate When compared with ECG of 01/26/2023, Premature ventricular complexes are now present Confirmed by Dione Booze ([id number]) on 02/28/2023 3:22:23 AM  Radiology CT HEAD CODE STROKE WO CONTRAST  Result Date: 02/28/2023 CLINICAL DATA:  Code stroke.  Left-sided facial numbness EXAM: CT HEAD WITHOUT CONTRAST TECHNIQUE: Contiguous axial images were obtained from the base of the skull through the vertex without intravenous contrast. RADIATION DOSE REDUCTION: This exam was performed according to the departmental dose-optimization program which  includes automated exposure control, adjustment of the mA and/or kV according to patient size and/or use of iterative reconstruction technique. COMPARISON:  01/26/2023 FINDINGS: Brain: No evidence of acute infarction, hemorrhage, mass, mass effect, or midline shift. No hydrocephalus or extra-axial collection. Encephalomalacia in the left frontal lobe, left occipital lobe, and right cerebellum from remote infarcts. Additional remote lacunar infarcts in the left thalamus and bilateral basal ganglia. Periventricular white matter changes, likely the sequela of chronic small vessel ischemic disease. Vascular: No hyperdense vessel. Atherosclerotic calcifications in the intracranial carotid and vertebral arteries. Skull: Negative for fracture or focal lesion. Sinuses/Orbits: Mild mucosal thickening in the paranasal sinuses. No acute finding in the orbits. Other: The mastoid air cells are well aerated. ASPECTS [facility] Stroke Program Early CT Score) - Ganglionic level infarction (caudate, lentiform nuclei, internal capsule, insula, M1-M3 cortex):  7 - Supraganglionic infarction (M4-M6 cortex): 3 Total score (0-10 with 10 being normal): 10 IMPRESSION: No acute intracranial process. ASPECTS is 10. Code stroke imaging results were communicated on 02/28/2023 at 2:30 am to provider [facility] via telephone, who verbally acknowledged these results. Electronically Signed   By: Wiliam Ke M.D.   On: 02/28/2023 02:30    Procedures Procedures  Cardiac monitor shows sinus rhythm with sinus arrhythmia, per my interpretation.  Medications Ordered in ED Medications - No data to display  ED Course/ Medical Decision Making/ A&P                                 Medical Decision Making Amount and/or Complexity of Data Reviewed Labs: ordered. Radiology: ordered.  Risk Prescription drug management. Decision regarding hospitalization.   Left-sided facial numbness with no other neurologic findings to suggest stroke.  I  reviewed his past records, and he had an ED visit on 01/26/2019 for for right-sided facial numbness.  Although his deficits are minimal, he is within the treatment window so I have activated code stroke.  CT of the head shows old strokes but no acute strokes, aspects is 10.  I have independently viewed the images, and agree with radiologist interpretation.  I have also personally discussed the findings with the radiologist.  Neurology consult appreciated, concern for possible new stroke, will need stroke workup including MRI scan.  I have personally discussed the case with the neurology consultant.  I have reviewed his laboratory tests, and my interpretation is stable renal insufficiency, elevated random glucose level which will need to be followed as an outpatient, mild thrombocytopenia which is not felt to be clinically significant but will need to be watched given the patient's use of enoxaparin.  I have reviewed his electrocardiogram, and my interpretation is frequent PVCs, incomplete right bundle branch block,, old inferior wall myocardial infarction (PVCs are new, other findings are unchanged from prior) because there is no evidence of intracranial bleeding and he has not received his enoxaparin, I did order a dose.  He will need to be admitted for stroke evaluation.  I have discussed case with Dr. Antionette Char of [facility], who agrees to admit the patient.  CRITICAL CARE Performed by: Dione Booze Total critical care time: 40 minutes Critical care time was exclusive of separately billable procedures and treating other patients. Critical care was necessary to treat or prevent imminent or life-threatening deterioration. Critical care was time spent personally by me on the following activities: development of treatment plan with patient and/or surrogate as well as nursing, discussions with consultants, evaluation of patient's response to treatment, examination of patient, obtaining history from patient  or surrogate, ordering and performing treatments and interventions, ordering and review of laboratory studies, ordering and review of radiographic studies, pulse oximetry and re-evaluation of patient's condition.  Final Clinical Impression(s) / ED Diagnoses Final diagnoses:  Left facial numbness  Renal insufficiency  Elevated random blood glucose level  Chronic anticoagulation    Rx / DC Orders ED Discharge Orders     None         Dione Booze, MD 02/28/23 [phone]

## 2023-02-28 NOTE — Evaluation (Addendum)
Physical Therapy Evaluation and Discharge Patient Details Name: [name] MRN: [id number] DOB: July 26, 1945 Today's Date: 02/28/2023  History of Present Illness  a 77 y.o. male with medical history significant for hypertension, hyperlipidemia, CAD, chronic HFmrEF, CVA, and PE on Lovenox after failing Eliquis and warfarin who now presents with left facial numbness.     Patient reports that he was in his usual state of health last night when he was watching TV.  He fell asleep at approximately 10 PM and when he woke up, the left side of his face was numb.  He denies any other acute numbness or weakness.  Symptoms persisted and he eventually called EMS.  He denies any recent chest pain, palpitations, fever, or chills    Clinical Impression  Patient demonstrating great return for all mobility. Pt was ambulating independently in room prior to PT evaluation. No BLE sensory changes or muscle weakness noted. Pt showing age appropriate reactions in standing and during partial DGI assessment. Patient is performing at functional baseline, no safety concerns noted throughout evaluation.  Patient is safe to DC home with appropriate services, DME, and any other needs indicated.  PT to sign off at this time.  Please reconsult acute PT services if functional changes occur while admitted.  Thank you.          If plan is discharge home, recommend the following:     Can travel by private vehicle        Equipment Recommendations    Recommendations for Other Services       Functional Status Assessment Patient has not had a recent decline in their functional status     Precautions / Restrictions Precautions Precautions: None      Mobility  Bed Mobility               General bed mobility comments: Pt moving about room independently when therapist entering. Sitting in recliner.    Transfers Overall transfer level: Independent                 General transfer comment: strong, smooth  sit/stand from recliner and EOB    Ambulation/Gait Ambulation/Gait assistance: Independent Gait Distance (Feet): 175 Feet Assistive device: None Gait Pattern/deviations: WFL(Within Functional Limits), Step-through pattern       General Gait Details: appropriate gait pattern with no LOB. Asssessed vertical and horizontal head turns and quick pivoting. All appropriate with no LOB.  Stairs            Wheelchair Mobility     Tilt Bed    Modified Rankin (Stroke Patients Only)       Balance Overall balance assessment: Needs assistance Sitting-balance support: No upper extremity supported Sitting balance-Leahy Scale: Normal     Standing balance support: No upper extremity supported Standing balance-Leahy Scale: Good Standing balance comment: mildly delayed age appropriate postural adjustments and anticipatory reactions.                             Pertinent Vitals/Pain Pain Assessment Pain Assessment: No/denies pain    Home Living Family/patient expects to be discharged to:: Private residence Living Arrangements: Children Available Help at Discharge: Family;Available 24 hours/day Type of Home: Apartment Home Access: Level entry       Home Layout: One level Home Equipment: None Additional Comments: Pt and daughter live together in appartment. NO DME.    Prior Function Prior Level of Function : Independent/Modified Independent  Mobility Comments: reports independence in community and household. Walks regularly a couple of blocks intermittently. No falls in the last 6 months. ADLs Comments: independent with driving, dressing, bathing and dressing. Daughter cooks.     Extremity/Trunk Assessment   Upper Extremity Assessment Upper Extremity Assessment: Overall WFL for tasks assessed    Lower Extremity Assessment Lower Extremity Assessment: Overall WFL for tasks assessed;RLE deficits/detail;LLE deficits/detail RLE Deficits /  Details: 4+/5 MMT Knee extension and ankle DF RLE Sensation: WNL LLE Deficits / Details: 4+/5 MMT Knee extension and ankle DF LLE Sensation: WNL    Cervical / Trunk Assessment Cervical / Trunk Assessment: Kyphotic  Communication   Communication Communication: Hearing impairment (Hard of hearing with R > L)  Cognition Arousal: Alert Behavior During Therapy: WFL for tasks assessed/performed Overall Cognitive Status: Within Functional Limits for tasks assessed                                          General Comments      Exercises     Assessment/Plan    PT Assessment Patient does not need any further PT services  PT Problem List         PT Treatment Interventions      PT Goals (Current goals can be found in the Care Plan section)       Frequency       Co-evaluation               AM-PAC PT "6 Clicks" Mobility  Outcome Measure Help needed turning from your back to your side while in a flat bed without using bedrails?: None Help needed moving from lying on your back to sitting on the side of a flat bed without using bedrails?: None Help needed moving to and from a bed to a chair (including a wheelchair)?: None Help needed standing up from a chair using your arms (e.g., wheelchair or bedside chair)?: None Help needed to walk in hospital room?: None Help needed climbing 3-5 steps with a railing? : A Little 6 Click Score: 23    End of Session Equipment Utilized During Treatment: Gait belt Activity Tolerance: Patient tolerated treatment well Patient left: in bed;Other (comment) (left with [place] tech) Nurse Communication: Mobility status      Time: 0539-7673 PT Time Calculation (min) (ACUTE ONLY): 14 min   Charges:   PT Evaluation $PT Eval Low Complexity: 1 Low   PT General Charges $$ ACUTE PT VISIT: 1 Visit         Nelida Meuse PT, DPT Physical Therapist with [facility] [phone]  office  Nelida Meuse 02/28/2023, 10:43 AM

## 2023-02-28 NOTE — Care Management Obs Status (Signed)
MEDICARE OBSERVATION STATUS NOTIFICATION   Patient Details  Name: [name] MRN: [id number] Date of Birth: Jul 03, 1945   Medicare Observation Status Notification Given:  Yes    Kasheena Sambrano Marsh Dolly, LCSW 02/28/2023, 5:36 PM

## 2023-02-28 NOTE — Progress Notes (Signed)
The patient is a 77 yr old man who presented to [facility] ED after awakening with left sided facial numbness. A CVA/TIA protocol was initiated. CT head was negative for any new abnormalities. MRI Brain was negative for acute abnormalities. CTA head and neck demonstrated no large vessel occlusion, Echocardiogram has demonstrated a left ventricular apical thrombus. EF 35-40% with moderately decreased function. There are regional wall motion abnormalities. There is evidence of GRade 1 diastolic dysfunction. It seems that the patient had been on lovenox at home for the known apical thrombus. It was administered by his daughter who is hospitalized at [facility]. She has not been available to give him his shots. I have discussed the patient with his daughter, [name]. [name] will come to [facility] tomorrow to receive education and administration of lovenox. I have explained to [name] that it is critical that the patient does not miss a dose of this medication.  The patient's symptoms appear to have resolved. The patient is neurologically intact by my exam. He has been place on a heparin drip in the meantime.   He has been evaluated by PT/OT. Recommendation has been made for outpatient PT. The patient is felt to be at his baseline.  This patient was admitted early this morning by my colleague, Dr. Antionette Char.

## 2023-02-28 NOTE — Progress Notes (Signed)
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  Patient Measurements: Height: 5\' 6"  (167.6 cm) Weight: 93 kg (205 lb 0.4 oz) IBW/kg (Calculated) : 63.8 Heparin Dosing Weight: 84kg  Vital Signs: Temp: 97.4 F (36.3 C) (11/23 2100) Temp Source: Oral (11/23 2100) BP: 129/64 (11/23 2100) Pulse Rate: 64 (11/23 2100)  Labs: Recent Labs    02/28/23 0211 02/28/23 0214 02/28/23 0522 02/28/23 2315  HGB 15.9 17.7* 16.1  --   HCT 49.8 52.0 51.5  --   PLT 148*  --  152  --   APTT 31  --   --   --   LABPROT 13.6  --   --   --   INR 1.0  --   --   --   HEPARINUNFRC  --   --   --  0.37  CREATININE 1.47* 1.70* 1.34*  --     Estimated Creatinine Clearance: 50.1 mL/min (A) (by C-G formula based on SCr of 1.34 mg/dL (H)).  Assessment: 77 year old male on chronic lovenox prior to admit for history of PE and strokes. Patient received lovenox early this morning, new orders to change to IV heparin. CBC appears normal. No bleeding issues noted. Will omit heparin bolus since he was recently administered lovenox.   11/23 PM: heparin level returned at 0.37 on 1000 units/hr (therapeutic). Per RN, no issues with the heparin infusion running continuously or signs/symptoms of bleeding . Last CBC stable  Goal of Therapy:  Heparin level 0.3-0.5 units/ml (lower goal given pending TIA/CVA workup) Monitor platelets by anticoagulation protocol: Yes   Plan:  Continue heparin infusion at 1000 units/hr Check confirmatory anti-Xa level in 8 hours and daily while on heparin Continue to monitor H&H and platelets  Arabella Merles, PharmD. Clinical Pharmacist 02/28/2023 11:41 PM

## 2023-02-28 NOTE — Progress Notes (Addendum)
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  Allergies  Allergen Reactions   Lisinopril Swelling and Other (See Comments)    Mouth and tongue swells    Patient Measurements: Height: 5\' 6"  (167.6 cm) Weight: 93 kg (205 lb 0.4 oz) IBW/kg (Calculated) : 63.8 Heparin Dosing Weight: 84kg  Vital Signs: Temp: 97.5 F (36.4 C) (11/23 1434) Temp Source: Oral (11/23 0401) BP: 124/72 (11/23 1434) Pulse Rate: 72 (11/23 1434)  Labs: Recent Labs    02/28/23 0211 02/28/23 0214 02/28/23 0522  HGB 15.9 17.7* 16.1  HCT 49.8 52.0 51.5  PLT 148*  --  152  APTT 31  --   --   LABPROT 13.6  --   --   INR 1.0  --   --   CREATININE 1.47* 1.70* 1.34*    Estimated Creatinine Clearance: 50.1 mL/min (A) (by C-G formula based on SCr of 1.34 mg/dL (H)).   Medical History: Past Medical History:  Diagnosis Date   Apical mural thrombus    Arteriosclerotic cardiovascular disease (ASCVD)    a. s/p CABG in 2011 b. DES to RCA in 08/2017 c. cath in 06/2018 showing patent LIMA-LAD, SVG-OM1-OM2 and SVG-D1   Chronic anticoagulation    Chronic systolic CHF (congestive heart failure) (HCC)    a. EF 45-50% in 06/2012 b. 35-40% in 08/2017 with similar results in 06/2018 c. EF at 30-35% in 10/2020   CVA (cerebral vascular accident) (HCC)    HTN (hypertension)    Hyperlipidemia    Keloid    median sternotomy   Pulmonary embolism (HCC) 03/2010   Substance abuse (HCC)    formerly cocaine    Assessment: 77 year old male on chronic lovenox prior to admit for history of PE and strokes. Patient received lovenox early this morning, new orders to change to IV heparin. CBC appears normal. No bleeding issues noted. Will omit heparin bolus since he was recently administered lovenox.    Goal of Therapy:  Heparin level 0.3-0.5 units/ml (lower goal given pending TIA/CVA workup) Monitor platelets by anticoagulation protocol: Yes   Plan:   Start heparin infusion at 1000 units/hr Check anti-Xa level in 6-8 hours and daily while on heparin Continue to monitor H&H and platelets  Sheppard Coil PharmD., BCPS Clinical Pharmacist 02/28/2023 2:41 PM

## 2023-02-28 NOTE — Plan of Care (Signed)
  Problem: Activity: Goal: Risk for activity intolerance will decrease Outcome: Progressing   Problem: Coping: Goal: Level of anxiety will decrease Outcome: Progressing   

## 2023-02-28 NOTE — Progress Notes (Signed)
   02/28/23 1653  TOC Brief Assessment  Insurance and Status Reviewed  Patient has primary care physician Yes  Home environment has been reviewed From home  Prior level of function: Independent with family support  Prior/Current Home Services No current home services  Social Determinants of Health Reivew SDOH reviewed no interventions necessary  Readmission risk has been reviewed Yes  Transition of care needs no transition of care needs at this time      Transition of Care Department [facility]) has reviewed patient and no TOC needs have been identified at this time. We will continue to monitor patient advancement through interdisciplinary progression rounds. If new patient transition needs arise, please place a TOC consult.

## 2023-02-28 NOTE — ED Triage Notes (Signed)
Pt stated that the left side of his face started getting numb after falling asleep watching football. Pt has hx of stroke and is currently on blood thinners. Pt ambulatory from EMS to triage. A&Ox4. Negative stroke screen with EMS

## 2023-02-28 NOTE — Progress Notes (Signed)
Patient started on heparin drip per order. Family updated on plan of care. Tammi will be back in the morning for lovenox teaching.

## 2023-02-28 NOTE — Progress Notes (Addendum)
Code stroke activated at 0207. Patient [name] was 8p when he went to bed. Patient complains of left sided facial numbness and no other symptoms at this time. VAN (-) patient has extensive history of stroke. Patient is currently on Lovenox and his stated last dose was 2 days ago d/t his daughter being sick. mRs is 0.    To CT at 0216. Returned from CT at 0224.   Telespecialist paged at 0223. Dr.Han on camera for evaluation and report at 0229. Patient deemed not a TNK candidate per provider. Plan to proceed with an MRI for further evaluation of stroke.   Bedside team and patient made aware of plan. Off camera at 0234.

## 2023-03-01 DIAGNOSIS — R200 Anesthesia of skin: Secondary | ICD-10-CM | POA: Diagnosis not present

## 2023-03-01 LAB — CBC
HCT: 52.8 % — ABNORMAL HIGH (ref 39.0–52.0)
Hemoglobin: 16.9 g/dL (ref 13.0–17.0)
MCH: 28.9 pg (ref 26.0–34.0)
MCHC: 32 g/dL (ref 30.0–36.0)
MCV: 90.4 fL (ref 80.0–100.0)
Platelets: 151 10*3/uL (ref 150–400)
RBC: 5.84 MIL/uL — ABNORMAL HIGH (ref 4.22–5.81)
RDW: 13.3 % (ref 11.5–15.5)
WBC: 6 10*3/uL (ref 4.0–10.5)
nRBC: 0 % (ref 0.0–0.2)

## 2023-03-01 LAB — BASIC METABOLIC PANEL
Anion gap: 9 (ref 5–15)
BUN: 15 mg/dL (ref 8–23)
CO2: 26 mmol/L (ref 22–32)
Calcium: 9.1 mg/dL (ref 8.9–10.3)
Chloride: 101 mmol/L (ref 98–111)
Creatinine, Ser: 1.42 mg/dL — ABNORMAL HIGH (ref 0.61–1.24)
GFR, Estimated: 51 mL/min — ABNORMAL LOW (ref >60)
Glucose, Bld: 113 mg/dL — ABNORMAL HIGH (ref 70–99)
Potassium: 3.5 mmol/L (ref 3.5–5.1)
Sodium: 136 mmol/L (ref 135–145)

## 2023-03-01 LAB — HEPARIN LEVEL (UNFRACTIONATED): Heparin Unfractionated: 0.37 [IU]/mL (ref 0.30–0.70)

## 2023-03-01 MED ORDER — METOPROLOL SUCCINATE ER 25 MG PO TB24: 25.0000 mg | ORAL_TABLET | Freq: Every day | ORAL | 0 refills | Status: DC

## 2023-03-01 MED ORDER — ENOXAPARIN SODIUM 100 MG/ML IJ SOSY: 1.0000 mg/kg | PREFILLED_SYRINGE | Freq: Two times a day (BID) | INTRAMUSCULAR | 0 refills | Status: DC

## 2023-03-01 MED ORDER — ATORVASTATIN CALCIUM 80 MG PO TABS: 80.0000 mg | ORAL_TABLET | Freq: Every day | ORAL | 0 refills | Status: DC

## 2023-03-01 MED ORDER — ENOXAPARIN SODIUM 100 MG/ML IJ SOSY
1.0000 mg/kg | PREFILLED_SYRINGE | Freq: Two times a day (BID) | INTRAMUSCULAR | Status: DC
  Administered 2023-03-01: 90 mg via SUBCUTANEOUS
  Filled 2023-03-01: qty 1

## 2023-03-01 NOTE — Discharge Summary (Signed)
Physician Discharge Summary   Patient: [name] MRN: [id number] DOB: 09-Aug-1945  Admit date:     02/28/2023  Discharge date: 03/01/23  Discharge Physician: Fran Lowes   PCP: Benetta Spar, MD   Recommendations at discharge:    Discharge to home Follow up with PCP in 7-10 days. Take lovenox bid.  Discharge Diagnoses: Principal Problem:   Left facial numbness Active Problems:   Essential hypertension   CAD (coronary artery disease)   Chronic kidney disease, stage 3a (HCC)   Chronic heart failure with mildly reduced ejection fraction (HFmrEF, 41-49%) (HCC)   History of stroke   History of pulmonary embolism  Resolved Problems:   * No resolved hospital problems. [facility] Course:  The patient is a 77 yr old man who presented to [facility] ED after awakening with left sided facial numbness. A CVA/TIA protocol was initiated. CT head was negative for any new abnormalities. MRI Brain was negative for acute abnormalities. CTA head and neck demonstrated no large vessel occlusion, Echocardiogram has demonstrated a left ventricular apical thrombus. EF 35-40% with moderately decreased function. There are regional wall motion abnormalities. There is evidence of GRade 1 diastolic dysfunction. It seems that the patient had been on lovenox at home for the known apical thrombus. It was administered by his daughter who is hospitalized at [facility]. She has not been available to give him his shots. I have discussed the patient with his daughter, [name]. [name] has come to [facility] this morning to receive education on the administration of lovenox. Per nursing [name] has done very well with the education.  I have explained to [name] that it is critical that the patient does not miss a dose of this medication. The patient will be discharged to home in fair condition today.    Assessment and Plan: 1. Left facial numbness; hx of CVA  - Continue cardiac monitoring and frequent neuro checks, check A1c, lipids,  MRI brain, echocardiogram, and CTA head & neck, increase Lipitor to 80 mg, consult PT/OT/SLP    2. Apical mural thrombus: The patient has been on lovenox for this and his history of PE at home. He has failed coumading and eliquis as outpatient. He is to take 1 mg/kg q 12 hours. This is administered by his daughter who is currently an inpatient at [facility]. This is the reason why the patient had not received his anticoagulation in the days prior to presentation. I believe that this is what was behind the patient's TIA. The patient has also admitted that he does not take the lovenox as prescribed. He only takes it once a day. He also does not take his plavix as prescribed. He has been advised that it is very important that he take his Lovenox as prescribed.    2. Hx of PE  - Failed Eliquis and warfarin, now on Lovenox   - Continue Lovenox 1 mg/kg Q12 as above.    3. Chronic HFmrEF  - Appears compensated  - Continue diuretics, monitor volume status     4. CAD  - No anginal complaints  - Continue Lipitor and Plavix    5. CKD 3A  - Appears close to baseline  - Renally-dose medications    6. HTN  - Permit HTN to 220 systolic for 24 hrs per neurology recommendations       DVT prophylaxis: Treatment-dose Lovenox  Code Status: Full  Level of Care: Level of care: Telemetry Family Communication: none present  Disposition  Plan:  Patient is from: Home  Anticipated d/c is to: Home  Anticipated d/c date is: 03/01/23         Consultants: Neurology Procedures performed: None  Disposition: Home Diet recommendation:  Discharge Diet Orders (From admission, onward)     Start     Ordered   03/01/23 0000  Diet - low sodium heart healthy        03/01/23 1108           Cardiac and Carb modified diet DISCHARGE MEDICATION: Allergies as of 03/01/2023       Reactions   Lisinopril Swelling, Other (See Comments)   Mouth and tongue swells        Medication List      TAKE these medications    atorvastatin 80 MG tablet Commonly known as: LIPITOR Take 1 tablet (80 mg total) by mouth daily at 6 PM. What changed:  medication strength how much to take   clopidogrel 75 MG tablet Commonly known as: PLAVIX Take 75 mg by mouth daily.   enoxaparin 100 MG/ML injection Commonly known as: LOVENOX Inject 0.9 mLs (90 mg total) into the skin every 12 (twelve) hours. What changed: how much to take   furosemide 20 MG tablet Commonly known as: LASIX TAKE 3 TABLETS BY MOUTH EVERY DAY What changed: how much to take   isosorbide mononitrate 60 MG 24 hr tablet Commonly known as: IMDUR Take 1 tablet (60 mg total) by mouth daily.   Jardiance 10 MG Tabs tablet Generic drug: empagliflozin TAKE 1 TABLET BY MOUTH EVERY DAY BEFORE BREAKFAST What changed: See the new instructions.   metoprolol succinate 25 MG 24 hr tablet Commonly known as: TOPROL-XL Take 1 tablet (25 mg total) by mouth daily. What changed:  medication strength how much to take   nitroGLYCERIN 0.4 MG SL tablet Commonly known as: NITROSTAT Place 1 tablet (0.4 mg total) under the tongue every 5 (five) minutes as needed for chest pain. up to 3 doses.   Rolaids Advanced 1000-200-40 MG Chew Generic drug: Cal Carb-Mag Hydrox-Simeth Chew 1-2 tablets by mouth daily as needed (heartburn).   spironolactone 25 MG tablet Commonly known as: ALDACTONE TAKE 1 TABLET (25 MG TOTAL) BY MOUTH DAILY.        Discharge Exam: Filed Weights   02/28/23 0134 02/28/23 0358 03/01/23 0500  Weight: 94.3 kg 93 kg 90.8 kg   Exam:  Constitutional:  The patient is awake, alert, and oriented x 3. No acute distress. Respiratory:  No increased work of breathing. No wheezes, rales, or rhonchi No tactile fremitus Cardiovascular:  Regular rate and rhythm No murmurs, ectopy, or gallups. No lateral PMI. No thrills. Abdomen:  Abdomen is soft, non-tender, non-distended No hernias, masses, or  organomegaly Normoactive bowel sounds.  Musculoskeletal:  No cyanosis, clubbing, or edema Skin:  No rashes, lesions, ulcers palpation of skin: no induration or nodules Neurologic:  CN 2-12 intact Sensation all 4 extremities intact Psychiatric:  Mental status Mood, affect appropriate Orientation to person, place, time  judgment and insight appear intact   Condition at discharge: fair  The results of significant diagnostics from this hospitalization (including imaging, microbiology, ancillary and laboratory) are listed below for reference.   Imaging Studies: ECHOCARDIOGRAM COMPLETE  Result Date: 02/28/2023    ECHOCARDIOGRAM REPORT   Patient Name:   TRIGGER KOSKIE Date of Exam: 02/28/2023 Medical Rec #:  [id number]       Height:       66.0 in Accession #:  [id number]      Weight:       205.0 lb Date of Birth:  July 09, 1945       BSA:          2.021 m Patient Age:    77 years        BP:           127/85 mmHg Patient Gender: M               HR:           63 bpm. [facility] Procedure: 2D Echo, Cardiac Doppler and Color Doppler Indications:    Storke l63.9  History:        Patient has prior history of Echocardiogram examinations, most                 recent 11/19/2022. CHF and Cardiomyopathy, CAD and Previous                 Myocardial Infarction, Prior CABG, Stroke; Risk                 Factors:Hypertension and Dyslipidemia. Pneumonia due to COVID-19                 virus.  Sonographer:    Celesta Gentile RCS Referring Phys: [id number] TIMOTHY S OPYD IMPRESSIONS  1. Left ventricular apical thrombus is present. 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 Left ventricular ejection fraction, by estimation, is 35 to 40%. The left ventricle has moderately decreased function. The left ventricle demonstrates regional wall motion abnormalities (see scoring diagram/findings for description). Left ventricular diastolic parameters are consistent with Grade I diastolic dysfunction (impaired relaxation).  2.  Right ventricular systolic function is normal. The right ventricular size is normal.  3. Left atrial size was mildly dilated.  4. The mitral valve is normal in structure. No evidence of mitral valve regurgitation. No evidence of mitral stenosis.  5. The aortic valve is normal in structure. Aortic valve regurgitation is not visualized. No aortic stenosis is present.  6. The inferior vena cava is normal in size with greater than 50% respiratory variability, suggesting right atrial pressure of 3 mmHg. FINDINGS  Left Ventricle: Left ventricular apical thrombus is present. 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 Left ventricular ejection fraction, by estimation, is 35 to 40%. The left ventricle has moderately decreased function. The left ventricle demonstrates regional wall motion abnormalities. Definity contrast agent was given IV to delineate the left ventricular endocardial borders. The left ventricular internal cavity size was normal in size. There is no left ventricular hypertrophy. Left ventricular diastolic parameters are consistent with Grade I diastolic dysfunction (impaired relaxation).  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 The anterior wall, antero-lateral wall, anterior septum, inferior wall, posterior wall, mid inferoseptal segment, and basal inferoseptal segment are hypokinetic. Right Ventricle: The right ventricular size is normal. No increase in right ventricular wall thickness. Right ventricular systolic function is normal. Left Atrium: Left atrial size was mildly dilated. Right Atrium: Right atrial size was normal in size. Pericardium: There is no evidence of pericardial effusion. Presence of epicardial fat layer. Mitral Valve: The mitral valve is normal in structure. No evidence of mitral valve regurgitation. No evidence of mitral valve stenosis. Tricuspid Valve: The tricuspid valve is normal in structure. Tricuspid valve regurgitation is not demonstrated. No evidence of tricuspid stenosis.  Aortic Valve: The aortic valve is normal in structure. Aortic valve regurgitation is not visualized. No aortic stenosis is present.  Pulmonic Valve: The pulmonic valve was normal in structure. Pulmonic valve regurgitation is not visualized. No evidence of pulmonic stenosis. Aorta: The aortic root is normal in size and structure. Venous: The inferior vena cava is normal in size with greater than 50% respiratory variability, suggesting right atrial pressure of 3 mmHg. IAS/Shunts: No atrial level shunt detected by color flow Doppler.  LEFT VENTRICLE PLAX 2D LVIDd:         4.80 cm   Diastology LVIDs:         4.00 cm   LV e' medial:    5.11 cm/s LV PW:         1.00 cm   LV E/e' medial:  13.0 LV IVS:        0.90 cm   LV e' lateral:   7.29 cm/s LVOT diam:     2.20 cm   LV E/e' lateral: 9.1 LV SV:         74 LV SV Index:   37 LVOT Area:     3.80 cm  RIGHT VENTRICLE RV S prime:     10.40 cm/s TAPSE (M-mode): 1.5 cm LEFT ATRIUM             Index        RIGHT ATRIUM           Index LA diam:        5.00 cm 2.47 cm/m   RA Area:     17.00 cm LA Vol (A2C):   69.2 ml 34.24 ml/m  RA Volume:   43.80 ml  21.67 ml/m LA Vol (A4C):   78.4 ml 38.79 ml/m LA Biplane Vol: 76.6 ml 37.90 ml/m  AORTIC VALVE LVOT Vmax:   84.85 cm/s LVOT Vmean:  52.650 cm/s LVOT VTI:    0.194 m  AORTA Ao Root diam: 3.60 cm MITRAL VALVE               TRICUSPID VALVE MV Area (PHT): 3.48 cm    TR Peak grad:   33.6 mmHg MV Decel Time: 218 msec    TR Vmax:        290.00 cm/s MV E velocity: 66.20 cm/s MV A velocity: 76.80 cm/s  SHUNTS MV E/A ratio:  0.86        Systemic VTI:  0.19 m                            Systemic Diam: 2.20 cm Kardie Tobb DO Electronically signed by Thomasene Ripple DO Signature Date/Time: 02/28/2023/1:50:46 PM    Final    MR BRAIN WO CONTRAST  Result Date: 02/28/2023 CLINICAL DATA:  77 year old male code stroke presentation. Left side deficit. EXAM: MRI HEAD WITHOUT CONTRAST TECHNIQUE: Multiplanar, multiecho pulse sequences of the brain  and surrounding structures were obtained without intravenous contrast. COMPARISON:  CT head and CTA head and neck earlier today. Brain MRI 01/26/2023. FINDINGS: Brain: No restricted diffusion or evidence of acute infarction. Multifocal chronic infarcts with cortical encephalomalacia, anterior left MCA, left PCA, right PCA, right PICA territories. Patchy and confluent additional bilateral cerebral white matter T2 and FLAIR hyperintensity. Stable basal ganglia heterogeneity. Chronic lacunar infarct(s) left thalamus. Small chronic left cerebellar infarcts. Comparatively mild pontine chronic small vessel disease. Mild chronic hemosiderin at the right PICA infarct. No midline shift, mass effect, evidence of mass lesion, ventriculomegaly, extra-axial collection or acute intracranial hemorrhage. Cervicomedullary junction and pituitary are within normal limits. Vascular: Major intracranial vascular flow  voids are stable. Skull and upper cervical spine: Stable and negative for age visible cervical spine. Visualized bone marrow signal is within normal limits. Sinuses/Orbits: Stable. Other: Stable, negative mastoids and visible internal auditory structures. IMPRESSION: No acute intracranial abnormality. Stable advanced chronic ischemic disease. Electronically Signed   By: Odessa Fleming M.D.   On: 02/28/2023 10:47   CT ANGIO HEAD NECK W WO CM  Result Date: 02/28/2023 CLINICAL DATA:  77 year old male code stroke presentation. Left side deficit. EXAM: CT ANGIOGRAPHY HEAD AND NECK WITH AND WITHOUT CONTRAST TECHNIQUE: Multidetector CT imaging of the head and neck was performed using the standard protocol during bolus administration of intravenous contrast. Multiplanar CT image reconstructions and MIPs were obtained to evaluate the vascular anatomy. Carotid stenosis measurements (when applicable) are obtained utilizing NASCET criteria, using the distal internal carotid diameter as the denominator. RADIATION DOSE REDUCTION: This exam  was performed according to the departmental dose-optimization program which includes automated exposure control, adjustment of the mA and/or kV according to patient size and/or use of iterative reconstruction technique. CONTRAST:  75mL OMNIPAQUE IOHEXOL 350 MG/ML SOLN COMPARISON:  Plain head CT 0221 hours today. Prior CTA head and neck 03/07/2017. FINDINGS: CTA NECK Skeleton: Sternotomy. Chronic or congenital bony defect of the posterior C1 ring, stable since 2018. No acute osseous abnormality identified. Upper chest: Emphysema. Moderate to severe parenchymal changes in the visible lungs, mildly progressed since 2018. Previous CABG. Small volume retained secretions in the trachea including on series 5, image 172. Visible carina is patent. Other neck: No acute finding. Aortic arch: Calcified aortic atherosclerosis.  Three vessel arch. Right carotid system: Mild brachiocephalic artery plaque without stenosis. Negative right CCA origin. Mild right CCA plaque before the bifurcation. Mild soft and calcified plaque at the right ICA origin without stenosis. Left carotid system: Left CCA origin soft and calcified plaque without stenosis. Intermittent mild left CCA plaque before the bifurcation. Calcified plaque at the left ICA origin but no significant stenosis to the skull base. Vertebral arteries: Chronic soft and calcified plaque at the right subclavian artery origin without stenosis. Right vertebral artery origin is normal. There is chronic right V1 calcified plaque which appears increased since 2018 on series 7, image 294. Mild to moderate stenosis there. Right vertebral otherwise patent to the skull base with no additional plaque or stenosis. Proximal left subclavian artery mostly soft plaque without stenosis. Normal left vertebral artery origin. Tortuous left V1 segment. Left vertebral artery appears stable since 2018 with no significant stenosis. CTA HEAD Posterior circulation: Codominant distal vertebral arteries.  Minimal left V4 plaque without stenosis. Mild right V4 calcified plaque with only mild stenosis. Patent PICA origins. Patent vertebrobasilar junction and basilar artery without stenosis. Patent SCA and PCA origins. Posterior communicating arteries are diminutive or absent. Bilateral PCA branches are within normal limits. Anterior circulation: Both ICA siphons are patent. Left siphon mild to moderate calcified plaque is most pronounced at the anterior genu with mild stenosis not significantly changed from 2018. Moderate right siphon calcified plaque with mild stenosis also not significantly changed. Patent carotid termini. Normal MCA and ACA origins. Diminutive or absent anterior communicating artery. Bilateral ACA branches are stable and within normal limits. Left MCA M1 segment and trifurcation are patent without stenosis. Right MCA M1 segment and trifurcation are patent without stenosis. Left MCA branches are stable and within normal limits. Right MCA branches are stable and within normal limits. Venous sinuses: Early contrast timing, grossly patent. Anatomic variants: None. Review of the MIP images confirms  the above findings IMPRESSION: 1. Negative for large vessel occlusion. 2. Intermittent atherosclerosis throughout the head and neck, most pronounced at the ICA siphons and right vertebral V1 segment with mild-to-moderate stenosis of those vessels. No significant progression since 2018 CTA. 3. Aortic Atherosclerosis (ICD10-I70.0) and Emphysema (ICD10-J43.9). Electronically Signed   By: Odessa Fleming M.D.   On: 02/28/2023 06:41   CT HEAD CODE STROKE WO CONTRAST  Result Date: 02/28/2023 CLINICAL DATA:  Code stroke.  Left-sided facial numbness EXAM: CT HEAD WITHOUT CONTRAST TECHNIQUE: Contiguous axial images were obtained from the base of the skull through the vertex without intravenous contrast. RADIATION DOSE REDUCTION: This exam was performed according to the departmental dose-optimization program which includes  automated exposure control, adjustment of the mA and/or kV according to patient size and/or use of iterative reconstruction technique. COMPARISON:  01/26/2023 FINDINGS: Brain: No evidence of acute infarction, hemorrhage, mass, mass effect, or midline shift. No hydrocephalus or extra-axial collection. Encephalomalacia in the left frontal lobe, left occipital lobe, and right cerebellum from remote infarcts. Additional remote lacunar infarcts in the left thalamus and bilateral basal ganglia. Periventricular white matter changes, likely the sequela of chronic small vessel ischemic disease. Vascular: No hyperdense vessel. Atherosclerotic calcifications in the intracranial carotid and vertebral arteries. Skull: Negative for fracture or focal lesion. Sinuses/Orbits: Mild mucosal thickening in the paranasal sinuses. No acute finding in the orbits. Other: The mastoid air cells are well aerated. ASPECTS [facility] Stroke Program Early CT Score) - Ganglionic level infarction (caudate, lentiform nuclei, internal capsule, insula, M1-M3 cortex): 7 - Supraganglionic infarction (M4-M6 cortex): 3 Total score (0-10 with 10 being normal): 10 IMPRESSION: No acute intracranial process. ASPECTS is 10. Code stroke imaging results were communicated on 02/28/2023 at 2:30 am to provider [facility] via telephone, who verbally acknowledged these results. Electronically Signed   By: Wiliam Ke M.D.   On: 02/28/2023 02:30    Microbiology: Results for orders placed or performed during the hospital encounter of 10/18/20  Resp Panel by RT-PCR (Flu A&B, Covid) Nasopharyngeal Swab     Status: None   Collection Time: 10/18/20  4:26 AM   Specimen: Nasopharyngeal Swab; Nasopharyngeal(NP) swabs in vial transport medium  Result Value Ref Range Status   SARS Coronavirus 2 by RT PCR NEGATIVE NEGATIVE Final    Comment: (NOTE) SARS-CoV-2 target nucleic acids are NOT DETECTED.  The SARS-CoV-2 RNA is generally detectable in upper respiratory specimens  during the acute phase of infection. The lowest concentration of SARS-CoV-2 viral copies this assay can detect is 138 copies/mL. A negative result does not preclude SARS-Cov-2 infection and should not be used as the sole basis for treatment or other patient management decisions. A negative result may occur with  improper specimen collection/handling, submission of specimen other than nasopharyngeal swab, presence of viral mutation(s) within the areas targeted by this assay, and inadequate number of viral copies(<138 copies/mL). A negative result must be combined with clinical observations, patient history, and epidemiological information. The expected result is Negative.  Fact Sheet for Patients:  BloggerCourse.com  Fact Sheet for Healthcare Providers:  SeriousBroker.it  This test is no t yet approved or cleared by the [place] FDA and  has been authorized for detection and/or diagnosis of SARS-CoV-2 by FDA under an Emergency Use Authorization (EUA). This EUA will remain  in effect (meaning this test can be used) for the duration of the COVID-19 declaration under Section 564(b)(1) of the Act, 21 U.S.C.section 360bbb-3(b)(1), unless the authorization is terminated  or revoked sooner.  Influenza A by PCR NEGATIVE NEGATIVE Final   Influenza B by PCR NEGATIVE NEGATIVE Final    Comment: (NOTE) The Xpert Xpress SARS-CoV-2/FLU/RSV plus assay is intended as an aid in the diagnosis of influenza from Nasopharyngeal swab specimens and should not be used as a sole basis for treatment. Nasal washings and aspirates are unacceptable for Xpert Xpress SARS-CoV-2/FLU/RSV testing.  Fact Sheet for Patients: BloggerCourse.com  Fact Sheet for Healthcare Providers: SeriousBroker.it  This test is not yet approved or cleared by the [place] FDA and has been authorized for detection  and/or diagnosis of SARS-CoV-2 by FDA under an Emergency Use Authorization (EUA). This EUA will remain in effect (meaning this test can be used) for the duration of the COVID-19 declaration under Section 564(b)(1) of the Act, 21 U.S.C. section 360bbb-3(b)(1), unless the authorization is terminated or revoked.  Performed at [facility], [address], [place], [place] [address]     Labs: CBC: Recent Labs  Lab 02/28/23 0211 02/28/23 0214 02/28/23 0522 03/01/23 0509  WBC 5.9  --  6.1 6.0  NEUTROABS 2.5  --   --   --   HGB 15.9 17.7* 16.1 16.9  HCT 49.8 52.0 51.5 52.8*  MCV 91.5  --  90.2 90.4  PLT 148*  --  152 151   Basic Metabolic Panel: Recent Labs  Lab 02/28/23 0211 02/28/23 0214 02/28/23 0522 03/01/23 0509  NA 138 141 138 136  K 3.8 3.9 3.8 3.5  CL 105 99 102 101  CO2 26  --  23 26  GLUCOSE 112* 106* 98 113*  BUN 16 17 15 15   CREATININE 1.47* 1.70* 1.34* 1.42*  CALCIUM 9.0  --  9.1 9.1   Liver Function Tests: Recent Labs  Lab 02/28/23 0211  AST 27  ALT 37  ALKPHOS 50  BILITOT 1.3*  PROT 7.2  ALBUMIN 4.0   CBG: Recent Labs  Lab 02/28/23 0215 02/28/23 0726  GLUCAP 126* 98    Discharge time spent: greater than 30 minutes.  Signed: Fran Lowes, DO [facility] 03/01/2023

## 2023-03-01 NOTE — Progress Notes (Signed)
Pt's daughter [name] here for education on administering Lovenox injections. Daughter able to correctly verbalize steps for hand hygiene, site selection, skin prep, dose adjustment in syringe (pt getting 90mg , syringe supplied as 100mg ), administration of med and proper needle disposal. She was able to perform the injection properly without difficulty. MD notified of completed education.

## 2023-03-01 NOTE — Progress Notes (Signed)
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  Patient Measurements: Height: 5\' 6"  (167.6 cm) Weight: 90.8 kg (200 lb 2.8 oz) IBW/kg (Calculated) : 63.8 Heparin Dosing Weight: 84kg  Vital Signs: Temp: 97.6 F (36.4 C) (11/24 0428) Temp Source: Oral (11/24 0428) BP: 113/72 (11/24 0428) Pulse Rate: 77 (11/24 0428)  Labs: Recent Labs    02/28/23 0211 02/28/23 0214 02/28/23 0522 02/28/23 2315 03/01/23 0509  HGB 15.9 17.7* 16.1  --  16.9  HCT 49.8 52.0 51.5  --  52.8*  PLT 148*  --  152  --  151  APTT 31  --   --   --   --   LABPROT 13.6  --   --   --   --   INR 1.0  --   --   --   --   HEPARINUNFRC  --   --   --  0.37 0.37  CREATININE 1.47* 1.70* 1.34*  --  1.42*    Estimated Creatinine Clearance: 46.7 mL/min (A) (by C-G formula based on SCr of 1.42 mg/dL (H)).  Assessment: 77 year old male on chronic lovenox prior to admit for history of PE and strokes. Patient received lovenox early this morning, new orders to change to IV heparin. CBC appears normal. No bleeding issues noted. Will omit heparin bolus since he was recently administered lovenox.   Heparin level continues to be at goal at 0.37 on 1000 units/hr (therapeutic). No issues with the heparin infusion running continuously or signs/symptoms of bleeding .CBC continues to be stable.   Goal of Therapy:  Heparin level 0.3-0.5 units/ml (lower goal given pending TIA/CVA workup) Monitor platelets by anticoagulation protocol: Yes   Plan:  Continue heparin infusion at 1000 units/hr Daily heparin level and CBC Continue to monitor H&H and platelets  Sheppard Coil PharmD., BCPS Clinical Pharmacist 03/01/2023 8:55 AM

## 2023-03-01 NOTE — Progress Notes (Signed)
Pt discharged ambulatory per his request, escorted by staff member to POV.

## 2023-03-08 NOTE — Progress Notes (Unsigned)
Cardiology Office Note   Date:  03/10/2023   ID:  [name], [name] [birth year], MRN [id number]  PCP:  Benetta Spar, MD  Cardiologist:   Dietrich Pates, MD    Pt presents for f/u of CAD      History of Present Illness: [name] is a 77 y.o. male with a history of CAD   He is s/p CABG x 4 in 2011.  In May 2019 he had intervention to native RCA.   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    In 2020 he presented with NSTEMI   Had been off of anticoagulation.   Cath showed severe native CAD with occluded prox LAD and LCx   The RCA was patent, stents also patent.   The LIMA to LAD; SVG to OM1, SVG to Diag were all patent.   Given presentation there was a question of possible coronary embolism     Treated with heparin    Echo at the time showed LVEF 35 to 40% with akinesis of inferior, septal, apical walls   In July 2022 the pt was admitted for PE  Echo in July showed LVEF 30 to 35% with akinesis of the apex, distal infeiror, anteroseptal walls    Echo in May 2023 (limited)   LVEF was 35 to 40%  RVEF was normal  I last sw the pt in clinic in May 2024 He was just discharged on 03/01/23 from [facility]  He was admitted with L facial numbness   CT and MRI negative for acute findings  Echo showed LV thrombus LVEF 35 to 405  He had been on lovenox at home after failing coumadin   His daughte was out of town and so he missed some shots Lipitor was increased to 80 mg      The pt is recovering   numbness improving    He denies CP  Breathing is OK  No palpitations    Pt has some bruising in abdomen from lovenox shots       Allergies:   Lisinopril   Past Medical History:  Diagnosis Date   Apical mural thrombus    Arteriosclerotic cardiovascular disease (ASCVD)    a. s/p CABG in 2011 b. DES to RCA in 08/2017 c. cath in 06/2018 showing patent LIMA-LAD, SVG-OM1-OM2 and SVG-D1   Chronic anticoagulation    Chronic systolic CHF (congestive  heart failure) (HCC)    a. EF 45-50% in 06/2012 b. 35-40% in 08/2017 with similar results in 06/2018 c. EF at 30-35% in 10/2020   CVA (cerebral vascular accident) (HCC)    HTN (hypertension)    Hyperlipidemia    Keloid    median sternotomy   Pulmonary embolism (HCC) 03/2010   Substance abuse (HCC)    formerly cocaine    Past Surgical History:  Procedure Laterality Date   COLONOSCOPY N/A 01/22/2017   Procedure: COLONOSCOPY;  Surgeon: West Bali, MD;  Location: [facility];  Service: Endoscopy;  Laterality: N/A;  200   CORONARY ARTERY BYPASS GRAFT  03/18/2010   LIMA-LAD, SVG to diagonal, OM1 & OM2   CORONARY STENT INTERVENTION N/A 08/21/2017   Procedure: CORONARY STENT INTERVENTION;  Surgeon: Tonny Bollman, MD;  Location: [facility] INVASIVE CV LAB;  Service: Cardiovascular;  Laterality: N/A;   LEFT HEART CATH AND CORS/GRAFTS ANGIOGRAPHY N/A 08/21/2017   Procedure: LEFT HEART CATH AND CORS/GRAFTS ANGIOGRAPHY;  Surgeon: Tonny Bollman, MD;  Location: [facility] INVASIVE CV LAB;  Service: Cardiovascular;  Laterality: N/A;   LEFT HEART CATH AND CORS/GRAFTS ANGIOGRAPHY N/A 06/21/2018   Procedure: LEFT HEART CATH AND CORS/GRAFTS ANGIOGRAPHY;  Surgeon: Yvonne Kendall, MD;  Location: [facility] INVASIVE CV LAB;  Service: Cardiovascular;  Laterality: N/A;   POLYPECTOMY  01/22/2017   Procedure: POLYPECTOMY;  Surgeon: West Bali, MD;  Location: [facility];  Service: Endoscopy;;  Transverse(CS) and sigmoid colon(HS)   PTCA  06/1996   LAD & RCA   TEE WITHOUT CARDIOVERSION N/A 06/10/2012   Procedure: TRANSESOPHAGEAL ECHOCARDIOGRAM (TEE);  Surgeon: Wendall Stade, MD;  Location: [facility];  Service: Cardiovascular;  Laterality: N/A;     Social History:  The patient  reports that he quit smoking about 20 years ago. His smoking use included cigarettes. He has never used smokeless tobacco. He reports that he does not currently use alcohol. He reports that he does not use drugs.   Family History:  The  patient's family history includes Cancer (age of onset: 79) in his mother; Stroke in his daughter.    ROS:  Please see the history of present illness. All other systems are reviewed and  Negative to the above problem except as noted.    PHYSICAL EXAM: VS:  BP 128/60   Pulse (!) 52   Wt 206 lb 3.2 oz (93.5 kg)   SpO2 96%   BMI 33.28 kg/m   GEN: Obese 77 yo in no acute distress  HEENT: normal  Neck: no JVD  No bruits   Cardiac: RRR; no murmur.   No LE edema  Respiratory:  clear to auscultation GI: soft, nontender No hepatomegaly   Some indurated areas (bruises) from sites of lovenox injection  No evid of infection    Nontender  Neuro   Deferred     EKG:  EKG not done   Echo  November 2024  1. Left ventricular apical thrombus is present. Left apex appears to be  anuerysmal. Left ventricular ejection fraction, by estimation, is 35 to  40%. The left ventricle has moderately decreased function. The left  ventricle demonstrates regional wall  motion abnormalities (see scoring diagram/findings for description). Left  ventricular diastolic parameters are consistent with Grade I diastolic  dysfunction (impaired relaxation).   2. Right ventricular systolic function is normal. The right ventricular  size is normal.   3. Left atrial size was mildly dilated.   4. The mitral valve is normal in structure. No evidence of mitral valve  regurgitation. No evidence of mitral stenosis.   5. The aortic valve is normal in structure. Aortic valve regurgitation is  not visualized. No aortic stenosis is present.   6. The inferior vena cava is normal in size with greater than 50%  respiratory variability, suggesting right atrial pressure of 3 mmHg.   Echo   May 2023  1. Limited study.   2. Left ventricular ejection fraction, by estimation, is 35 to 40%. The  left ventricle has moderately decreased function. The left ventricle  demonstrates regional wall motion abnormalities (see scoring   diagram/findings for description).   3. Definity contrast shows slow flow at LV apex but no formed thrombus.   4. Right ventricular systolic function is normal. The right ventricular  size is normal.   5. The inferior vena cava is normal in size with greater than 50%  respiratory variability, suggesting right atrial pressure of 3 mmHg.   Echo  July 2022 1. Left ventricular ejection fraction, by estimation,  is 30 to 35%. The left ventricle has normal function. Left ventricular endocardial border not optimally defined to evaluate regional wall motion. Left ventricular diastolic parameters were normal. The entire apex is akinetic, there is some stasis of blood flow in the apex without clear thrombus. The distal inferior, anteroseptal walls are akinetic. The inferior wall is hypokinetic. 2. Right ventricular systolic function was not well visualized. The right ventricular size is not well visualized. There is moderately elevated pulmonary artery systolic pressure. 3. The mitral valve is normal in structure. No evidence of mitral valve regurgitation. No evidence of mitral stenosis. 4. The aortic valve is tricuspid. Aortic valve regurgitation is not visualized. No aortic stenosis is present. 5. The inferior vena cava is normal in size with greater than 50% respiratory variabil  Cath; The following studies were reviewed today: Cath March 2020- Conclusions: Severe native coronary artery disease, including occluded proximal LAD and LCx.  RCA with patent mid/distal stents and mild in-stent restenosis. Widely patent LIMA-LAD, SVG-OM1-OM, and SVG-D. Mildly elevated left ventricular filling pressure.   Recommendations: Medical therapy; question coronary embolism with spontaneous lysis.  Plan to restart heparin infusion 2 hours after TR band removal and resumption of apixaban tomorrow AM if no evidence of bleeding. Continue clopidogrel 75 mg daily.  Defer continuing aspirin in the setting of  long-term anticoagulation and clopidogrel use. Aggressive secondary prevention.    Lipid Panel    Component Value Date/Time   CHOL 77 02/28/2023 0522   TRIG 53 02/28/2023 0522   HDL 40 (L) 02/28/2023 0522   CHOLHDL 1.9 02/28/2023 0522   VLDL 11 02/28/2023 0522   LDLCALC 26 02/28/2023 0522      Wt Readings from Last 3 Encounters:  03/09/23 206 lb 3.2 oz (93.5 kg)  03/01/23 200 lb 2.8 oz (90.8 kg)  12/31/22 206 lb (93.4 kg)      ASSESSMENT AND PLAN:  1  CAD   Last LHC  in 2020   Pt with severe native dz  Patent grafts     Pt denies angina   Follow    2  HFrEF   Recent echo   LVEF 35 to 40%  Volume status ok   Keep on Hydralazine, Imdur, Toprol XL, Aldactone, Jardiance.     Had angioedema with ACE I / ARB    3  Hx PE  Keep on lovenox   4  Hx of mural LV thrombus  Present on recent echo    Keep on lovenox  5 Neuro   CVA in 2014  No with recent   TIA   Felt to be related to missed lovenox  Stressed importance of regular administration     6  HTN   BP is controlled     7  HL  On lipitor   LDL 26, HDL 37   Trig 93         Current medicines are reviewed at length with the patient today.  The patient does not have concerns regarding medicines.  Signed, Dietrich Pates, MD  03/10/2023 11:10 PM    [facility] [address] [place], [place], [place]  [address] Phone: [phone]; Fax: [phone]

## 2023-03-09 ENCOUNTER — Encounter: Payer: Self-pay | Admitting: Internal Medicine

## 2023-03-09 ENCOUNTER — Ambulatory Visit: Payer: 59 | Attending: Internal Medicine | Admitting: Internal Medicine

## 2023-03-09 VITALS — BP 128/60 | HR 52 | Wt 206.2 lb

## 2023-03-09 DIAGNOSIS — I2510 Atherosclerotic heart disease of native coronary artery without angina pectoris: Secondary | ICD-10-CM | POA: Diagnosis not present

## 2023-03-09 NOTE — Patient Instructions (Signed)
Medication Instructions:  Your physician recommends that you continue on your current medications as directed. Please refer to the Current Medication list given to you today.  *If you need a refill on your cardiac medications before your next appointment, please call your pharmacy*   Lab Work: NONE   If you have labs (blood work) drawn today and your tests are completely normal, you will receive your results only by: [place] (if you have MyChart) OR A paper copy in the mail If you have any lab test that is abnormal or we need to change your treatment, we will call you to review the results.   Testing/Procedures: NONE    Follow-Up: At [facility], you and your health needs are our priority.  As part of our continuing mission to provide you with exceptional heart care, we have created designated Provider Care Teams.  These Care Teams include your primary Cardiologist (physician) and Advanced Practice Providers (APPs -  Physician Assistants and Nurse Practitioners) who all work together to provide you with the care you need, when you need it.  We recommend signing up for the patient portal called "MyChart".  Sign up information is provided on this After Visit Summary.  MyChart is used to connect with patients for Virtual Visits (Telemedicine).  Patients are able to view lab/test results, encounter notes, upcoming appointments, etc.  Non-urgent messages can be sent to your provider as well.   To learn more about what you can do with MyChart, go to [email or web address].    Your next appointment:   6 month(s)  Provider:   You may see [name], MD or one of the following Advanced Practice Providers on your designated Care Team:   Bernerd Pho, PA-C  Ermalinda Barrios, PA-C     Other Instructions Thank you for choosing [facility]!

## 2023-04-06 ENCOUNTER — Other Ambulatory Visit (HOSPITAL_COMMUNITY): Payer: Self-pay | Admitting: *Deleted

## 2023-04-06 ENCOUNTER — Observation Stay (HOSPITAL_BASED_OUTPATIENT_CLINIC_OR_DEPARTMENT_OTHER): Payer: 59

## 2023-04-06 ENCOUNTER — Observation Stay (HOSPITAL_COMMUNITY): Payer: 59

## 2023-04-06 ENCOUNTER — Encounter (HOSPITAL_COMMUNITY): Payer: Self-pay

## 2023-04-06 ENCOUNTER — Observation Stay (HOSPITAL_COMMUNITY)
Admission: EM | Admit: 2023-04-06 | Discharge: 2023-04-06 | Disposition: A | Discharge status: Home Health | Payer: 59 | Attending: Emergency Medicine | Admitting: Emergency Medicine

## 2023-04-06 ENCOUNTER — Other Ambulatory Visit: Payer: Self-pay

## 2023-04-06 ENCOUNTER — Emergency Department (HOSPITAL_COMMUNITY): Payer: 59

## 2023-04-06 DIAGNOSIS — G459 Transient cerebral ischemic attack, unspecified: Secondary | ICD-10-CM

## 2023-04-06 DIAGNOSIS — I513 Intracardiac thrombosis, not elsewhere classified: Secondary | ICD-10-CM

## 2023-04-06 DIAGNOSIS — R200 Anesthesia of skin: Principal | ICD-10-CM

## 2023-04-06 DIAGNOSIS — I2782 Chronic pulmonary embolism: Secondary | ICD-10-CM | POA: Diagnosis not present

## 2023-04-06 DIAGNOSIS — Z7902 Long term (current) use of antithrombotics/antiplatelets: Secondary | ICD-10-CM | POA: Insufficient documentation

## 2023-04-06 DIAGNOSIS — Z955 Presence of coronary angioplasty implant and graft: Secondary | ICD-10-CM | POA: Diagnosis not present

## 2023-04-06 DIAGNOSIS — Z951 Presence of aortocoronary bypass graft: Secondary | ICD-10-CM | POA: Insufficient documentation

## 2023-04-06 DIAGNOSIS — I5042 Chronic combined systolic (congestive) and diastolic (congestive) heart failure: Secondary | ICD-10-CM | POA: Insufficient documentation

## 2023-04-06 DIAGNOSIS — I10 Essential (primary) hypertension: Secondary | ICD-10-CM

## 2023-04-06 DIAGNOSIS — Z86711 Personal history of pulmonary embolism: Secondary | ICD-10-CM | POA: Diagnosis not present

## 2023-04-06 DIAGNOSIS — N1831 Chronic kidney disease, stage 3a: Secondary | ICD-10-CM

## 2023-04-06 DIAGNOSIS — E669 Obesity, unspecified: Secondary | ICD-10-CM

## 2023-04-06 DIAGNOSIS — Z6833 Body mass index (BMI) 33.0-33.9, adult: Secondary | ICD-10-CM | POA: Diagnosis not present

## 2023-04-06 DIAGNOSIS — I130 Hypertensive heart and chronic kidney disease with heart failure and stage 1 through stage 4 chronic kidney disease, or unspecified chronic kidney disease: Secondary | ICD-10-CM | POA: Diagnosis not present

## 2023-04-06 DIAGNOSIS — R202 Paresthesia of skin: Principal | ICD-10-CM

## 2023-04-06 DIAGNOSIS — Z7901 Long term (current) use of anticoagulants: Secondary | ICD-10-CM | POA: Diagnosis not present

## 2023-04-06 DIAGNOSIS — Z79899 Other long term (current) drug therapy: Secondary | ICD-10-CM | POA: Diagnosis not present

## 2023-04-06 DIAGNOSIS — I2510 Atherosclerotic heart disease of native coronary artery without angina pectoris: Secondary | ICD-10-CM | POA: Diagnosis not present

## 2023-04-06 DIAGNOSIS — Z8673 Personal history of transient ischemic attack (TIA), and cerebral infarction without residual deficits: Secondary | ICD-10-CM | POA: Insufficient documentation

## 2023-04-06 DIAGNOSIS — Z7984 Long term (current) use of oral hypoglycemic drugs: Secondary | ICD-10-CM | POA: Diagnosis not present

## 2023-04-06 DIAGNOSIS — I2699 Other pulmonary embolism without acute cor pulmonale: Secondary | ICD-10-CM | POA: Diagnosis present

## 2023-04-06 DIAGNOSIS — Z87891 Personal history of nicotine dependence: Secondary | ICD-10-CM | POA: Diagnosis not present

## 2023-04-06 DIAGNOSIS — I5022 Chronic systolic (congestive) heart failure: Secondary | ICD-10-CM

## 2023-04-06 LAB — CBC
HCT: 51.7 % (ref 39.0–52.0)
HCT: 49.3 % (ref 39.0–52.0)
Hemoglobin: 16.6 g/dL (ref 13.0–17.0)
Hemoglobin: 15.9 g/dL (ref 13.0–17.0)
MCH: 29.4 pg (ref 26.0–34.0)
MCH: 29 pg (ref 26.0–34.0)
MCHC: 32.1 g/dL (ref 30.0–36.0)
MCHC: 32.3 g/dL (ref 30.0–36.0)
MCV: 91.5 fL (ref 80.0–100.0)
MCV: 90 fL (ref 80.0–100.0)
Platelets: 168 10*3/uL (ref 150–400)
Platelets: 163 10*3/uL (ref 150–400)
RBC: 5.65 MIL/uL (ref 4.22–5.81)
RBC: 5.48 MIL/uL (ref 4.22–5.81)
RDW: 13.5 % (ref 11.5–15.5)
RDW: 13.4 % (ref 11.5–15.5)
WBC: 6.3 10*3/uL (ref 4.0–10.5)
WBC: 5.7 10*3/uL (ref 4.0–10.5)
nRBC: 0 % (ref 0.0–0.2)
nRBC: 0 % (ref 0.0–0.2)

## 2023-04-06 LAB — DIFFERENTIAL
Abs Immature Granulocytes: 0.01 10*3/uL (ref 0.00–0.07)
Basophils Absolute: 0.1 10*3/uL (ref 0.0–0.1)
Basophils Relative: 1 %
Eosinophils Absolute: 0.2 10*3/uL (ref 0.0–0.5)
Eosinophils Relative: 3 %
Immature Granulocytes: 0 %
Lymphocytes Relative: 49 %
Lymphs Abs: 3 10*3/uL (ref 0.7–4.0)
Monocytes Absolute: 0.7 10*3/uL (ref 0.1–1.0)
Monocytes Relative: 11 %
Neutro Abs: 2.3 10*3/uL (ref 1.7–7.7)
Neutrophils Relative %: 36 %

## 2023-04-06 LAB — COMPREHENSIVE METABOLIC PANEL
ALT: 59 U/L — ABNORMAL HIGH (ref 0–44)
ALT: 55 U/L — ABNORMAL HIGH (ref 0–44)
AST: 31 U/L (ref 15–41)
AST: 29 U/L (ref 15–41)
Albumin: 4.2 g/dL (ref 3.5–5.0)
Albumin: 3.9 g/dL (ref 3.5–5.0)
Alkaline Phosphatase: 47 U/L (ref 38–126)
Alkaline Phosphatase: 40 U/L (ref 38–126)
Anion gap: 8 (ref 5–15)
Anion gap: 11 (ref 5–15)
BUN: 16 mg/dL (ref 8–23)
BUN: 15 mg/dL (ref 8–23)
CO2: 27 mmol/L (ref 22–32)
CO2: 23 mmol/L (ref 22–32)
Calcium: 9.4 mg/dL (ref 8.9–10.3)
Calcium: 9.1 mg/dL (ref 8.9–10.3)
Chloride: 103 mmol/L (ref 98–111)
Chloride: 104 mmol/L (ref 98–111)
Creatinine, Ser: 1.52 mg/dL — ABNORMAL HIGH (ref 0.61–1.24)
Creatinine, Ser: 1.29 mg/dL — ABNORMAL HIGH (ref 0.61–1.24)
GFR, Estimated: 47 mL/min — ABNORMAL LOW (ref >60)
GFR, Estimated: 57 mL/min — ABNORMAL LOW (ref >60)
Glucose, Bld: 102 mg/dL — ABNORMAL HIGH (ref 70–99)
Glucose, Bld: 95 mg/dL (ref 70–99)
Potassium: 3.6 mmol/L (ref 3.5–5.1)
Potassium: 3.5 mmol/L (ref 3.5–5.1)
Sodium: 138 mmol/L (ref 135–145)
Sodium: 138 mmol/L (ref 135–145)
Total Bilirubin: 1.2 mg/dL — ABNORMAL HIGH (ref <1.2)
Total Bilirubin: 1.2 mg/dL — ABNORMAL HIGH (ref <1.2)
Total Protein: 7.9 g/dL (ref 6.5–8.1)
Total Protein: 7.5 g/dL (ref 6.5–8.1)

## 2023-04-06 LAB — LIPID PANEL
Cholesterol: 79 mg/dL (ref 0–200)
HDL: 40 mg/dL — ABNORMAL LOW (ref >40)
LDL Cholesterol: 23 mg/dL (ref 0–99)
Total CHOL/HDL Ratio: 2 {ratio}
Triglycerides: 80 mg/dL (ref <150)
VLDL: 16 mg/dL (ref 0–40)

## 2023-04-06 LAB — CBG MONITORING, ED: Glucose-Capillary: 116 mg/dL — ABNORMAL HIGH (ref 70–99)

## 2023-04-06 LAB — APTT: aPTT: 53 s — ABNORMAL HIGH (ref 24–36)

## 2023-04-06 LAB — ECHOCARDIOGRAM COMPLETE
Area-P 1/2: 3.21 cm2
Height: 66 in
S' Lateral: 3.9 cm
Weight: 3273.39 [oz_av]

## 2023-04-06 LAB — PROTIME-INR
INR: 1 (ref 0.8–1.2)
Prothrombin Time: 13.7 s (ref 11.4–15.2)

## 2023-04-06 LAB — PHOSPHORUS: Phosphorus: 3.6 mg/dL (ref 2.5–4.6)

## 2023-04-06 LAB — ETHANOL: Alcohol, Ethyl (B): <10 mg/dL (ref <10)

## 2023-04-06 LAB — TSH: TSH: 1.957 u[IU]/mL (ref 0.350–4.500)

## 2023-04-06 LAB — MAGNESIUM: Magnesium: 1.9 mg/dL (ref 1.7–2.4)

## 2023-04-06 MED ORDER — ATORVASTATIN CALCIUM 40 MG PO TABS
80.0000 mg | ORAL_TABLET | Freq: Every day | ORAL | Status: DC
Start: 2023-04-06 — End: 2023-04-06

## 2023-04-06 MED ORDER — ACETAMINOPHEN 650 MG RE SUPP: 650.0000 mg | Freq: Four times a day (QID) | RECTAL | Status: DC | PRN

## 2023-04-06 MED ORDER — ENOXAPARIN SODIUM 100 MG/ML IJ SOSY
1.0000 mg/kg | PREFILLED_SYRINGE | Freq: Two times a day (BID) | INTRAMUSCULAR | Status: DC
  Administered 2023-04-06: 90 mg via SUBCUTANEOUS
  Filled 2023-04-06: qty 1

## 2023-04-06 MED ORDER — ONDANSETRON HCL 4 MG PO TABS: 4.0000 mg | ORAL_TABLET | Freq: Four times a day (QID) | ORAL | Status: DC | PRN

## 2023-04-06 MED ORDER — EMPAGLIFLOZIN 10 MG PO TABS
10.0000 mg | ORAL_TABLET | Freq: Every day | ORAL | Status: DC
Start: 2023-04-06 — End: 2023-04-06
  Administered 2023-04-06: 10 mg via ORAL
  Filled 2023-04-06: qty 1

## 2023-04-06 MED ORDER — PERFLUTREN LIPID MICROSPHERE
1.0000 mL | INTRAVENOUS | Status: AC | PRN
  Administered 2023-04-06: 5 mL via INTRAVENOUS

## 2023-04-06 MED ORDER — ONDANSETRON HCL 4 MG/2ML IJ SOLN: 4.0000 mg | Freq: Four times a day (QID) | INTRAMUSCULAR | Status: DC | PRN

## 2023-04-06 MED ORDER — ACETAMINOPHEN 325 MG PO TABS
650.0000 mg | ORAL_TABLET | Freq: Four times a day (QID) | ORAL | Status: DC | PRN
Start: 2023-04-06 — End: 2023-04-06

## 2023-04-06 MED ORDER — CLOPIDOGREL BISULFATE 75 MG PO TABS
75.0000 mg | ORAL_TABLET | Freq: Every day | ORAL | Status: DC
  Administered 2023-04-06: 75 mg via ORAL
  Filled 2023-04-06: qty 1

## 2023-04-06 NOTE — ED Provider Notes (Signed)
[facility] EMERGENCY DEPARTMENT AT [facility] Provider Note   CSN: [id number] Arrival date & time: 04/06/23  2952  An emergency department physician performed an initial assessment on this suspected stroke patient at 21.  History  Chief Complaint  Patient presents with   Numbness    [name] is a 77 y.o. male.  Patient presents to the emergency department with left facial numbness.  Patient reported onset of symptoms around 10 PM tonight.  He had some episodes of numbness and heaviness of the left arm, possibly left leg as well but those have resolved.       Home Medications Prior to Admission medications   Medication Sig Start Date End Date Taking? Authorizing Provider  atorvastatin (LIPITOR) 80 MG tablet Take 1 tablet (80 mg total) by mouth daily at 6 PM. 03/01/23   Swayze, Ava, DO  Cal Carb-Mag Hydrox-Simeth (ROLAIDS ADVANCED) 1000-200-40 MG CHEW Chew 1-2 tablets by mouth daily as needed (heartburn).    [provider]  clopidogrel (PLAVIX) 75 MG tablet Take 75 mg by mouth daily. Patient not taking: Reported on 02/28/2023    [provider]  empagliflozin (JARDIANCE) 10 MG TABS tablet TAKE 1 TABLET BY MOUTH EVERY DAY BEFORE BREAKFAST Patient taking differently: Take 10 mg by mouth daily. 12/23/22   Pricilla Riffle, MD  enoxaparin (LOVENOX) 100 MG/ML injection Inject 0.9 mLs (90 mg total) into the skin every 12 (twelve) hours. 03/01/23   Swayze, Ava, DO  furosemide (LASIX) 20 MG tablet TAKE 3 TABLETS BY MOUTH EVERY DAY Patient taking differently: Take 40 mg by mouth daily. 10/02/20   Pricilla Riffle, MD  isosorbide mononitrate (IMDUR) 60 MG 24 hr tablet Take 1 tablet (60 mg total) by mouth daily. 09/12/20 03/09/23  Pricilla Riffle, MD  metoprolol succinate (TOPROL-XL) 25 MG 24 hr tablet Take 1 tablet (25 mg total) by mouth daily. 03/01/23   Swayze, Ava, DO  nitroGLYCERIN (NITROSTAT) 0.4 MG SL tablet Place 1 tablet (0.4 mg total) under the tongue  every 5 (five) minutes as needed for chest pain. up to 3 doses. 07/05/13   Jodelle Gross, NP  spironolactone (ALDACTONE) 25 MG tablet TAKE 1 TABLET (25 MG TOTAL) BY MOUTH DAILY. 08/15/22 08/10/23  Ellsworth Lennox, PA-C      Allergies    Lisinopril    Review of Systems   Review of Systems  Physical Exam Updated Vital Signs BP (!) 143/90   Pulse 60   Temp 98.3 F (36.8 C) (Oral)   Resp 12   Ht 5\' 6"  (1.676 m)   Wt 94 kg   SpO2 95%   BMI 33.45 kg/m  Physical Exam Vitals and nursing note reviewed.  Constitutional:      General: He is not in acute distress.    Appearance: He is well-developed.  HENT:     Head: Normocephalic and atraumatic.     Mouth/Throat:     Mouth: Mucous membranes are moist.  Eyes:     General: Vision grossly intact. Gaze aligned appropriately.     Extraocular Movements: Extraocular movements intact.     Conjunctiva/sclera: Conjunctivae normal.  Cardiovascular:     Rate and Rhythm: Normal rate and regular rhythm.     Pulses: Normal pulses.     Heart sounds: Normal heart sounds, S1 normal and S2 normal. No murmur heard.    No friction rub. No gallop.  Pulmonary:     Effort: Pulmonary effort is normal. No respiratory  distress.     Breath sounds: Normal breath sounds.  Abdominal:     Palpations: Abdomen is soft.     Tenderness: There is no abdominal tenderness. There is no guarding or rebound.     Hernia: No hernia is present.  Musculoskeletal:        General: No swelling.     Cervical back: Full passive range of motion without pain, normal range of motion and neck supple. No pain with movement, spinous process tenderness or muscular tenderness. Normal range of motion.     Right lower leg: No edema.     Left lower leg: No edema.  Skin:    General: Skin is warm and dry.     Capillary Refill: Capillary refill takes less than 2 seconds.     Findings: No ecchymosis, erythema, lesion or wound.  Neurological:     Mental Status: He is alert and  oriented to person, place, and time.     GCS: GCS eye subscore is 4. GCS verbal subscore is 5. GCS motor subscore is 6.     Cranial Nerves: Cranial nerves 2-12 are intact.     Sensory: Sensation is intact.     Motor: Motor function is intact. No weakness or abnormal muscle tone.     Coordination: Coordination is intact.  Psychiatric:        Mood and Affect: Mood normal.        Speech: Speech normal.        Behavior: Behavior normal.     ED Results / Procedures / Treatments   Labs (all labs ordered are listed, but only abnormal results are displayed) Labs Reviewed  APTT - Abnormal; Notable for the following components:      Result Value   aPTT 53 (*)    All other components within normal limits  COMPREHENSIVE METABOLIC PANEL - Abnormal; Notable for the following components:   Glucose, Bld 102 (*)    Creatinine, Ser 1.52 (*)    ALT 59 (*)    Total Bilirubin 1.2 (*)    GFR, Estimated 47 (*)    All other components within normal limits  CBG MONITORING, ED - Abnormal; Notable for the following components:   Glucose-Capillary 116 (*)    All other components within normal limits  ETHANOL  PROTIME-INR  CBC  DIFFERENTIAL  RAPID URINE DRUG SCREEN, HOSP PERFORMED  URINALYSIS, ROUTINE W REFLEX MICROSCOPIC    EKG EKG Interpretation Date/Time:  Monday April 06 2023 00:42:53 EST Ventricular Rate:  81 PR Interval:  127 QRS Duration:  152 QT Interval:  458 QTC Calculation: 458 R Axis:   54  Text Interpretation: Sinus rhythm Paired ventricular premature complexes Right bundle branch block Inferior infarct, old Anterolateral infarct, age indeterminate No significant change since last tracing Confirmed by Gilda Crease [phone]) on 04/06/2023 12:58:29 AM  Radiology CT HEAD CODE STROKE WO CONTRAST Result Date: 04/06/2023 CLINICAL DATA:  Code stroke.  Left facial numbness EXAM: CT HEAD WITHOUT CONTRAST TECHNIQUE: Contiguous axial images were obtained from the base of the  skull through the vertex without intravenous contrast. RADIATION DOSE REDUCTION: This exam was performed according to the departmental dose-optimization program which includes automated exposure control, adjustment of the mA and/or kV according to patient size and/or use of iterative reconstruction technique. COMPARISON:  None Available. FINDINGS: Brain: There is no mass, hemorrhage or extra-axial collection. The size and configuration of the ventricles and extra-axial CSF spaces are normal. There is hypoattenuation of the periventricular white  matter, most commonly indicating chronic ischemic microangiopathy. There are old left frontal and occipital infarcts. Old right cerebellar infarct. Vascular: No abnormal hyperdensity of the major intracranial arteries or dural venous sinuses. No intracranial atherosclerosis. Skull: The visualized skull base, calvarium and extracranial soft tissues are normal. Sinuses/Orbits: No fluid levels or advanced mucosal thickening of the visualized paranasal sinuses. No mastoid or middle ear effusion. The orbits are normal. ASPECTS [facility] Stroke Program Early CT Score) - Ganglionic level infarction (caudate, lentiform nuclei, internal capsule, insula, M1-M3 cortex): 7 - Supraganglionic infarction (M4-M6 cortex): 3 Total score (0-10 with 10 being normal): 10 IMPRESSION: 1. No acute intracranial abnormality. 2. ASPECTS is 10. 3. Old left frontal and occipital infarcts. Old right cerebellar infarct. These results were called by telephone at the time of interpretation on 04/06/2023 at 1:07 am to provider [facility] , who verbally acknowledged these results. Electronically Signed   By: Deatra Robinson M.D.   On: 04/06/2023 01:08    Procedures Procedures    Medications Ordered in ED Medications - No data to display  ED Course/ Medical Decision Making/ A&P                                 Medical Decision Making Amount and/or Complexity of Data Reviewed External Data  Reviewed: labs, radiology, ECG and notes. Labs: ordered. Decision-making details documented in ED Course. Radiology: ordered and independent interpretation performed. Decision-making details documented in ED Course. ECG/medicine tests: ordered and independent interpretation performed. Decision-making details documented in ED Course.   Differential diagnosis considered includes, but not limited to:  TIA; Stroke; seizure; metabolic encephalopathy   Presents to the emergency department for evaluation of left sided numbness.  Patient with prior history of similar symptoms and negative workups.  Patient does, however, have a history of strokes in the past.  Additionally he has a an apical mural thrombus, currently being treated with Lovenox.  Code stroke was initiated.  He was evaluated by teleneurology.  Patient with mild and improving symptoms, currently anticoagulated, not a candidate for tPA.  Screening CT is negative.  Patient recommended for observation, further stroke workup.  CRITICAL CARE Performed by: Gilda Crease   Total critical care time: 32 minutes  Critical care time was exclusive of separately billable procedures and treating other patients.  Critical care was necessary to treat or prevent imminent or life-threatening deterioration.  Critical care was time spent personally by me on the following activities: development of treatment plan with patient and/or surrogate as well as nursing, discussions with consultants, evaluation of patient's response to treatment, examination of patient, obtaining history from patient or surrogate, ordering and performing treatments and interventions, ordering and review of laboratory studies, ordering and review of radiographic studies, pulse oximetry and re-evaluation of patient's condition.         Final Clinical Impression(s) / ED Diagnoses Final diagnoses:  Paresthesia    Rx / DC Orders ED Discharge Orders     None          Jolanta Cabeza, Canary Brim, MD 04/06/23 [phone]

## 2023-04-06 NOTE — Evaluation (Signed)
Occupational Therapy Evaluation Patient Details Name: [name] MRN: [id number] DOB: [birth year] Today's Date: 04/06/2023   History of Present Illness a 77 y.o. male with medical history significant for hypertension, hyperlipidemia, CAD, chronic HFmrEF, CVA, and PE on Lovenox after failing Eliquis and warfarin who now presents with left facial numbness.     Patient reports that he was in his usual state of health last night when he was watching TV.  He fell asleep at approximately 10 PM and when he woke up, the left side of his face was numb.  He denies any other acute numbness or weakness.  Symptoms persisted and he eventually called EMS.  He denies any recent chest pain, palpitations, fever, or chills   Clinical Impression   Pt agreeable to OT and PT co-evaluation. Pt appears to be near functional baseline, but is presenting with L shoulder pain and limited A/ROM as well as numbness in L UE and L LE. Pt is able to complete ADL's and ambulate without assist. Pt is not recommended for further acute OT services and will be discharged to care of nursing staff for remaining length of stay.              Functional Status Assessment  Patient has not had a recent decline in their functional status  Equipment Recommendations  None recommended by OT           Precautions / Restrictions Precautions Precautions: Fall Restrictions Weight Bearing Restrictions Per Provider Order: No      Mobility Bed Mobility Overal bed mobility: Independent                  Transfers Overall transfer level: Independent                        Balance Overall balance assessment: Independent                                         ADL either performed or assessed with clinical judgement   ADL Overall ADL's : Independent                                       General ADL Comments: Able to don socks seated at EOB.     Vision Baseline  Vision/History: 1 Wears glasses Ability to See in Adequate Light: 1 Impaired Patient Visual Report: No change from baseline Vision Assessment?: No apparent visual deficits     Perception Perception: Not tested       Praxis Praxis: Not tested       Pertinent Vitals/Pain Pain Assessment Pain Assessment: Faces Faces Pain Scale: Hurts little more Pain Location: L shoulder with movement. Pain Descriptors / Indicators: Grimacing, Guarding     Extremity/Trunk Assessment Upper Extremity Assessment Upper Extremity Assessment: Right hand dominant;LUE deficits/detail LUE Deficits / Details: 3-/5 shoulder flexion; near full P/ROM but with pain for flexion. Reports of numbness in L UE and pain in shoulder. Mild weakness for elbow extension. WFL strength otherwise. LUE Sensation: decreased light touch LUE Coordination: WNL   Lower Extremity Assessment Lower Extremity Assessment: Defer to PT evaluation   Cervical / Trunk Assessment Cervical / Trunk Assessment: Normal   Communication Communication Communication: Hearing impairment   Cognition Arousal: Alert Behavior During Therapy: [facility] for  tasks assessed/performed Overall Cognitive Status: Within Functional Limits for tasks assessed                                                        Home Living Family/patient expects to be discharged to:: Private residence Living Arrangements: Children Available Help at Discharge: Family;Available 24 hours/day Type of Home: Apartment Home Access: Level entry     Home Layout: One level     Bathroom Shower/Tub: Chief Strategy Officer: Standard Bathroom Accessibility: Yes   Home Equipment: None   Additional Comments: Pt and daughter live together in appartment.      Prior Functioning/Environment Prior Level of Function : Independent/Modified Independent             Mobility Comments: Independent without AD ADLs Comments: independent with  driving, dressing, bathing and dressing. Daughter cooks. (per chart review)                                Co-evaluation PT/OT/SLP Co-Evaluation/Treatment: Yes Reason for Co-Treatment: To address functional/ADL transfers   OT goals addressed during session: ADL's and self-care      AM-PAC OT "6 Clicks" Daily Activity     Outcome Measure Help from another person eating meals?: None Help from another person taking care of personal grooming?: None Help from another person toileting, which includes using toliet, bedpan, or urinal?: None Help from another person bathing (including washing, rinsing, drying)?: None Help from another person to put on and taking off regular upper body clothing?: None Help from another person to put on and taking off regular lower body clothing?: None 6 Click Score: 24   End of Session    Activity Tolerance: Patient tolerated treatment well Patient left: in bed;with call bell/phone within reach;with family/visitor present  OT Visit Diagnosis: Other symptoms and signs involving the nervous system (G40.102)                Time: 7253-6644 OT Time Calculation (min): 14 min Charges:  OT General Charges $OT Visit: 1 Visit OT Evaluation $OT Eval Low Complexity: 1 Low  Audrie Kuri OT, MOT  Danie Chandler 04/06/2023, 9:26 AM

## 2023-04-06 NOTE — ED Triage Notes (Signed)
Pov from home. Cc of left facial numbness. Says his leg and shoulder was kinda numb nut not anymore Says it started at 10pm Patient very hard of hearing and is poor historian,

## 2023-04-06 NOTE — Plan of Care (Signed)

## 2023-04-06 NOTE — ED Notes (Signed)
ED TO INPATIENT HANDOFF REPORT  ED Nurse Name and Phone #: Karoline Caldwell [phone]  S Name/Age/Gender [name] 77 y.o. male Room/Bed: APA03/APA03  Code Status   Code Status: Prior  Home/SNF/Other Home Patient oriented to: self, place, time, and situation Is this baseline? Yes   Triage Complete: Triage complete  Chief Complaint Numbness and tingling of left side of face [R20.0, R20.2]  Triage Note Pov from home. Cc of left facial numbness. Says his leg and shoulder was kinda numb nut not anymore Says it started at 10pm Patient very hard of hearing and is poor historian,    Allergies Allergies  Allergen Reactions   Lisinopril Swelling and Other (See Comments)    Mouth and tongue swells    Level of Care/Admitting Diagnosis ED Disposition     ED Disposition  Admit   Condition  --   Comment  Hospital Area: [facility] [[id number]]  Level of Care: Telemetry [5]  Covid Evaluation: Asymptomatic - no recent exposure (last 10 days) testing not required  Diagnosis: Numbness and tingling of left side of face [[id number]]  Admitting Physician: Frankey Shown [[id number]]  Attending Physician: Frankey Shown [[id number]]          B Medical/Surgery History Past Medical History:  Diagnosis Date   Apical mural thrombus    Arteriosclerotic cardiovascular disease (ASCVD)    a. s/p CABG in 2011 b. DES to RCA in 08/2017 c. cath in 06/2018 showing patent LIMA-LAD, SVG-OM1-OM2 and SVG-D1   Chronic anticoagulation    Chronic systolic CHF (congestive heart failure) (HCC)    a. EF 45-50% in 06/2012 b. 35-40% in 08/2017 with similar results in 06/2018 c. EF at 30-35% in 10/2020   CVA (cerebral vascular accident) (HCC)    HTN (hypertension)    Hyperlipidemia    Keloid    median sternotomy   Pulmonary embolism (HCC) 03/2010   Substance abuse (HCC)    formerly cocaine   Past Surgical History:  Procedure Laterality Date   COLONOSCOPY N/A 01/22/2017   Procedure: COLONOSCOPY;   Surgeon: West Bali, MD;  [facility];  Service: Endoscopy;  Laterality: N/A;  200   CORONARY ARTERY BYPASS GRAFT  03/18/2010   LIMA-LAD, SVG to diagonal, OM1 & OM2   CORONARY STENT INTERVENTION N/A 08/21/2017   Procedure: CORONARY STENT INTERVENTION;  Surgeon: Tonny Bollman, MD;  Location: [facility] INVASIVE CV LAB;  Service: Cardiovascular;  Laterality: N/A;   LEFT HEART CATH AND CORS/GRAFTS ANGIOGRAPHY N/A 08/21/2017   Procedure: LEFT HEART CATH AND CORS/GRAFTS ANGIOGRAPHY;  Surgeon: Tonny Bollman, MD;  Location: [facility] INVASIVE CV LAB;  Service: Cardiovascular;  Laterality: N/A;   LEFT HEART CATH AND CORS/GRAFTS ANGIOGRAPHY N/A 06/21/2018   Procedure: LEFT HEART CATH AND CORS/GRAFTS ANGIOGRAPHY;  Surgeon: Yvonne Kendall, MD;  Location: [facility] INVASIVE CV LAB;  Service: Cardiovascular;  Laterality: N/A;   POLYPECTOMY  01/22/2017   Procedure: POLYPECTOMY;  Surgeon: West Bali, MD;  [facility];  Service: Endoscopy;;  Transverse(CS) and sigmoid colon(HS)   PTCA  06/1996   LAD & RCA   TEE WITHOUT CARDIOVERSION N/A 06/10/2012   Procedure: TRANSESOPHAGEAL ECHOCARDIOGRAM (TEE);  Surgeon: Wendall Stade, MD;  Location: [facility];  Service: Cardiovascular;  Laterality: N/A;     A IV Location/Drains/Wounds Patient Lines/Drains/Airways Status     Active Line/Drains/Airways     Name Placement date Placement time Site Days   Peripheral IV 04/06/23 20 G Right Antecubital 04/06/23  0044  Antecubital  less than 1            Intake/Output Last 24 hours No intake or output data in the 24 hours ending 04/06/23 0308  Labs/Imaging Results for orders placed or performed during the hospital encounter of 04/06/23 (from the past 48 hours)  Ethanol     Status: None   Collection Time: 04/06/23 12:43 AM  Result Value Ref Range   Alcohol, Ethyl (B) <10 <10 mg/dL    Comment: (NOTE) Lowest detectable limit for serum alcohol is 10 mg/dL.  For medical purposes only. Performed  at [facility], [address] [place], [place] [address]   Protime-INR     Status: None   Collection Time: 04/06/23 12:43 AM  Result Value Ref Range   Prothrombin Time 13.7 11.4 - 15.2 seconds   INR 1.0 0.8 - 1.2    Comment: (NOTE) INR goal varies based on device and disease states. Performed at [facility], [address], [place], [place] [address]   APTT     Status: Abnormal   Collection Time: 04/06/23 12:43 AM  Result Value Ref Range   aPTT 53 (H) 24 - 36 seconds    Comment:        IF BASELINE aPTT IS ELEVATED, SUGGEST PATIENT RISK ASSESSMENT BE USED TO DETERMINE APPROPRIATE ANTICOAGULANT THERAPY. Performed at [facility], [address] [place], [place] [address]   CBC     Status: None   Collection Time: 04/06/23 12:43 AM  Result Value Ref Range   WBC 6.3 4.0 - 10.5 K/uL   RBC 5.65 4.22 - 5.81 MIL/uL   Hemoglobin 16.6 13.0 - 17.0 g/dL   HCT 91.4 78.2 - 95.6 %   MCV 91.5 80.0 - 100.0 fL   MCH 29.4 26.0 - 34.0 pg   MCHC 32.1 30.0 - 36.0 g/dL   RDW 21.3 08.6 - 57.8 %   Platelets 168 150 - 400 K/uL   nRBC 0.0 0.0 - 0.2 %    Comment: Performed at [facility], [address] [place], [place] [address]  Differential     Status: None   Collection Time: 04/06/23 12:43 AM  Result Value Ref Range   Neutrophils Relative % 36 %   Neutro Abs 2.3 1.7 - 7.7 K/uL   Lymphocytes Relative 49 %   Lymphs Abs 3.0 0.7 - 4.0 K/uL   Monocytes Relative 11 %   Monocytes Absolute 0.7 0.1 - 1.0 K/uL   Eosinophils Relative 3 %   Eosinophils Absolute 0.2 0.0 - 0.5 K/uL   Basophils Relative 1 %   Basophils Absolute 0.1 0.0 - 0.1 K/uL   Immature Granulocytes 0 %   Abs Immature Granulocytes 0.01 0.00 - 0.07 K/uL    Comment: Performed at [facility], [address] [place], [place] [address]  Comprehensive metabolic panel     Status: Abnormal   Collection Time: 04/06/23 12:43 AM  Result Value Ref Range   Sodium 138 135 - 145 mmol/L   Potassium 3.6 3.5 - 5.1 mmol/L   Chloride 103 98 -  111 mmol/L   CO2 27 22 - 32 mmol/L   Glucose, Bld 102 (H) 70 - 99 mg/dL    Comment: Glucose reference range applies only to samples taken after fasting for at least 8 hours.   BUN 16 8 - 23 mg/dL   Creatinine, Ser 1.32 (H) 0.61 - 1.24 mg/dL   Calcium 9.4 8.9 - 44.0 mg/dL   Total Protein 7.9 6.5 - 8.1 g/dL  Albumin 4.2 3.5 - 5.0 g/dL   AST 31 15 - 41 U/L   ALT 59 (H) 0 - 44 U/L   Alkaline Phosphatase 47 38 - 126 U/L   Total Bilirubin 1.2 (H) <1.2 mg/dL   GFR, Estimated 47 (L) >60 mL/min    Comment: (NOTE) Calculated using the CKD-EPI Creatinine Equation (2021)    Anion gap 8 5 - 15    Comment: Performed at [facility], [address], [place], [place] [address]  CBG monitoring, ED     Status: Abnormal   Collection Time: 04/06/23  1:06 AM  Result Value Ref Range   Glucose-Capillary 116 (H) 70 - 99 mg/dL    Comment: Glucose reference range applies only to samples taken after fasting for at least 8 hours.   CT HEAD CODE STROKE WO CONTRAST Result Date: 04/06/2023 CLINICAL DATA:  Code stroke.  Left facial numbness EXAM: CT HEAD WITHOUT CONTRAST TECHNIQUE: Contiguous axial images were obtained from the base of the skull through the vertex without intravenous contrast. RADIATION DOSE REDUCTION: This exam was performed according to the departmental dose-optimization program which includes automated exposure control, adjustment of the mA and/or kV according to patient size and/or use of iterative reconstruction technique. COMPARISON:  None Available. FINDINGS: Brain: There is no mass, hemorrhage or extra-axial collection. The size and configuration of the ventricles and extra-axial CSF spaces are normal. There is hypoattenuation of the periventricular white matter, most commonly indicating chronic ischemic microangiopathy. There are old left frontal and occipital infarcts. Old right cerebellar infarct. Vascular: No abnormal hyperdensity of the major intracranial arteries or dural venous sinuses. No  intracranial atherosclerosis. Skull: The visualized skull base, calvarium and extracranial soft tissues are normal. Sinuses/Orbits: No fluid levels or advanced mucosal thickening of the visualized paranasal sinuses. No mastoid or middle ear effusion. The orbits are normal. ASPECTS [facility] Stroke Program Early CT Score) - Ganglionic level infarction (caudate, lentiform nuclei, internal capsule, insula, M1-M3 cortex): 7 - Supraganglionic infarction (M4-M6 cortex): 3 Total score (0-10 with 10 being normal): 10 IMPRESSION: 1. No acute intracranial abnormality. 2. ASPECTS is 10. 3. Old left frontal and occipital infarcts. Old right cerebellar infarct. These results were called by telephone at the time of interpretation on 04/06/2023 at 1:07 am to provider [facility] , who verbally acknowledged these results. Electronically Signed   By: Deatra Robinson M.D.   On: 04/06/2023 01:08    Pending Labs Unresulted Labs (From admission, onward)     Start     Ordered   04/06/23 0057  Urine rapid drug screen (hosp performed)  Once,   STAT        04/06/23 0056   04/06/23 0057  Urinalysis, Routine w reflex microscopic -Urine, Clean Catch  Once,   URGENT       Question:  Specimen Source  Answer:  Urine, Clean Catch   04/06/23 0056            Vitals/Pain Today's Vitals   04/06/23 0042 04/06/23 0043 04/06/23 0300  BP:  (!) 143/90 121/65  Pulse:  60 (!) 49  Resp:  12 17  Temp:  98.3 F (36.8 C)   TempSrc:  Oral   SpO2:  95% 94%  Weight: 207 lb 3.7 oz (94 kg)    Height: 5\' 6"  (1.676 m)      Isolation Precautions No active isolations  Medications Medications - No data to display  Mobility walks     Focused Assessments    R Recommendations:  See Admitting Provider Note  Report given to:   Additional Notes:

## 2023-04-06 NOTE — Care Management Obs Status (Signed)
MEDICARE OBSERVATION STATUS NOTIFICATION   Patient Details  Name: [name] MRN: [id number] Date of Birth: 08/29/1945   Medicare Observation Status Notification Given:  Yes    Villa Herb, LCSWA 04/06/2023, 12:19 PM

## 2023-04-06 NOTE — Consult Note (Signed)
[facility] Tele[facility]urology Consult Services   Patient Name:   Carolan, Zakry Date of Birth:   1945-04-19 Date of Service:   04/06/2023 01:04:44  Diagnosis:       R20.2 - Paresthesia of skin  Impression:      (This consult was conducted in real-time using interactive audio and video technology. Patient was informed of the technology being used for this visit and agreed to proceed. Patient located in hospital and provider located at home/office setting.)    This is a 77 year old male with history of old multifocal strokes who takes therapeutic Lovenox q12h at home due to an apical thrombus and hasnt missed any doses, who presents [facility] [place] for left face and shoulder numbness.    He reports this started at about 04/05/2023 22:00:00 (~3.25 hours ago). The left shoulder has improved but the left face is still mildly tingling. NHSS is 1 for sensory decrease of the left face. He passed a swallow test and can ambulate. he denies new vision issues or any other new symptoms. CTH today shows known old strokes and no new pathology.    Of note, he presented last month for left facial numbness as well and MRI was negative for acute pathology.    With his history, a small acute stroke is on the differential. However, due to therapeutic lovenox use at home, as well as mild non-disabling and improving symptoms, he is not a tPA/TNK candidate at this time. Recommendations below.  Recommendations:        Neuro Checks       Euglycemia and Avoid Hyperthermia (PRN Acetaminophen)       Antihypertensives PRN if Blood pressure is greater than 220/120 or there is a concern for End organ damage/contraindications for permissive HTN. If blood pressure is greater than 220/120 give labetalol PO or IV or Vasotec IV with a goal of 15% reduction in BP during the first 24 hours.        Brain MRI without contrast (routine,  diagnostic).        Can continue home Lovenox and Plavix.        Atorvastatin 80 mg daily.        TTE        HbA1C, Lipid Panel, TSH.        Lifestyle Modifications: Weight management (goal healthy BMI), smoking cessation if smoker, Mediterranean diet, exercise (moderate intensive aerobic exercise at least 3 days of the week for 20-60 minutes at a time per [facility] guidelines, or as tolerated), active lifestyle, outpatient goal normotension, outpatient goal LDL <70. PCP to assist with these modifications and goals.        If the patient worsens clinically, such as an increase of NIHSS by 2 or more points, or new focal findings arise, or other, please immediately update Neurology at [phone] and update the primary team, and or call a new stroke alert.        ------------------------------------------------------------------------------  Advanced Imaging: Advanced Imaging Deferred because:  Current exam is not suggestive of an acute surgically intervenable large vessel occlusion.   Metrics: Last Known Well: 04/05/2023 22:00:00 Dispatch Time: 04/06/2023 01:04:44 Arrival Time: 04/06/2023 00:19:00 Initial Response Time: 04/06/2023 01:09:24 Symptoms: left facial paresthesia. Initial patient interaction: 04/06/2023 01:09:24 NIHSS Assessment Completed: 04/06/2023 01:14:00 Patient is not a candidate for Thrombolytic. Thrombolytic Medical Decision: 04/06/2023 01:14:00 Patient was not deemed candidate for Thrombolytic because of following reasons: Therapeutic dose of LMWH in  last 24 hours . Stroke severity too mild (non-disabling) .  I personally Reviewed the CT Head and it Showed old multifocal strokes (right cerebellar, left frontal, left occipital)  Primary Provider Notified of Diagnostic Impression and Management Plan on: 04/06/2023 01:28:01    ------------------------------------------------------------------------------  History of Present Illness: Patient is a 77 year  old Male.  Patient was brought by private transportation with symptoms of left facial paresthesia. (This consult was conducted in real-time using interactive audio and Immunologist. Patient was informed of the technology being used for this visit and agreed to proceed. Patient located in hospital and provider located at home/office setting.)  This is a 77 year old male with history of old multifocal strokes who takes therapeutic Lovenox q12h at home due to an apical thrombus and hasnt missed any doses, who [facility] [place] for left face and shoulder numbness.  He reports this started at about 04/05/2023 22:00:00 (~3.25 hours ago). The left shoulder has improved but the left face is still mildly tingling. NHSS is 1 for sensory decrease of the left face. He passed a swallow test and can ambulate. he denies new vision issues or any other new symptoms. CTH today shows known old strokes and no new pathology.  Of note, he presented last month for left facial numbness as well and MRI was negative for acute pathology.   Past Medical History:      Hypertension      Hyperlipidemia      Stroke  Medications:  Anticoagulant use:  Yes lovenox, therapeutic dose, BID Antiplatelet use: Yes plavix Reviewed EMR for current medications  Allergies:  Reviewed  Social History: Drug Use: No  Family History:  There is no family history of premature cerebrovascular disease pertinent to this consultation  ROS : 14 Points Review of Systems was performed and was negative except mentioned in HPI.  Past Surgical History: There Is No Surgical History Contributory To Today's Visit    Examination: 1A: Level of Consciousness - Alert; keenly responsive + 0 1B: Ask Month and Age - Both Questions Right + 0 1C: Blink Eyes & Squeeze Hands - Performs Both Tasks + 0 2: Test Horizontal Extraocular Movements - Normal + 0 3: Test Visual  Fields - No Visual Loss + 0 4: Test Facial Palsy (Use Grimace if Obtunded) - Normal symmetry + 0 5A: Test Left Arm Motor Drift - No Drift for 10 Seconds + 0 5B: Test Right Arm Motor Drift - No Drift for 10 Seconds + 0 6A: Test Left Leg Motor Drift - No Drift for 5 Seconds + 0 6B: Test Right Leg Motor Drift - No Drift for 5 Seconds + 0 7: Test Limb Ataxia (FNF/Heel-Shin) - No Ataxia + 0 8: Test Sensation - Mild-Moderate Loss: Less Sharp/More Dull + 1 9: Test Language/Aphasia - Normal; No aphasia + 0 10: Test Dysarthria - Normal + 0 11: Test Extinction/Inattention - No abnormality + 0  NIHSS Score: 1  NIHSS Free Text : very mild sensory decrease on left face. arms and legs same.  Pre-Morbid Modified Rankin Scale: 0 Points = No symptoms at all  Spoke with : ed provider  This consult was conducted in real time using interactive audio and Immunologist. Patient was informed of the technology being used for this visit and agreed to proceed. Patient located in hospital and provider located at home/office setting.   Patient is being evaluated for possible acute  neurologic impairment and high probability of imminent or life-threatening deterioration. I spent total of 30 minutes providing care to this patient, including time for face to face visit via telemedicine, review of medical records, imaging studies and discussion of findings with providers, the patient and/or family.   Dr Flonnie Overman   [facility] For Inpatient follow-up with [facility] physician please call [facility] at [phone]. As we are not an outpatient service for any post hospital discharge needs please contact the hospital for assistance. If you have any questions for the [facility] physicians or need to reconsult for clinical or diagnostic changes please contact us via [facility] at [phone].

## 2023-04-06 NOTE — Progress Notes (Signed)
Patient not in room at this time.                     Celesta Gentile, RCS

## 2023-04-06 NOTE — Progress Notes (Signed)
*  PRELIMINARY RESULTS* Echocardiogram 2D Echocardiogram has been performed with Definity.  Stacey Drain 04/06/2023, 11:30 AM

## 2023-04-06 NOTE — H&P (Signed)
History and Physical    Patient: [name] NGE:[id number] DOB: October 17, 1945 DOA: 04/06/2023 DOS: the patient was seen and examined on 04/06/2023 PCP: Benetta Spar, MD  Patient coming from: Home  Chief Complaint:  Chief Complaint  Patient presents with   Numbness   HPI: [name] is a 77 y.o. 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  Left leg already resolved, left shoulder has improved, but patient still complaining of mild tingling on left side of face. Patient was recently admitted for similar presentation from 11/23 to 11/24 during which patient presented with left facial numbness.  MRI done at that time was negative for acute pathology.  ED Course:  In the emergency department, BP was 143/90, other vital signs were within normal range.  Workup in the ED showed 1 CBC and BMP except for creatinine of 1.52 (creatinine is within baseline range), blood glucose of 102 CT head without contrast showed no acute intracranial abnormality, but showed old left frontal and occipital infarcts and old right cerebellar infarct. Teleneurologist was consulted and recommended further stroke workup. Hospitalist was asked to admit patient for further evaluation and management.  Review of Systems: Review of systems as noted in the HPI. All other systems reviewed and are negative.   Past Medical History:  Diagnosis Date   Apical mural thrombus    Arteriosclerotic cardiovascular disease (ASCVD)    a. s/p CABG in 2011 b. DES to RCA in 08/2017 c. cath in 06/2018 showing patent LIMA-LAD, SVG-OM1-OM2 and SVG-D1   Chronic anticoagulation    Chronic systolic CHF (congestive heart failure) (HCC)    a. EF 45-50% in 06/2012 b. 35-40% in 08/2017 with  similar results in 06/2018 c. EF at 30-35% in 10/2020   CVA (cerebral vascular accident) (HCC)    HTN (hypertension)    Hyperlipidemia    Keloid    median sternotomy   Pulmonary embolism (HCC) 03/2010   Substance abuse (HCC)    formerly cocaine   Past Surgical History:  Procedure Laterality Date   COLONOSCOPY N/A 01/22/2017   Procedure: COLONOSCOPY;  Surgeon: West Bali, MD;  Location: [facility];  Service: Endoscopy;  Laterality: N/A;  200   CORONARY ARTERY BYPASS GRAFT  03/18/2010   LIMA-LAD, SVG to diagonal, OM1 & OM2   CORONARY STENT INTERVENTION N/A 08/21/2017   Procedure: CORONARY STENT INTERVENTION;  Surgeon: Tonny Bollman, MD;  Location: [facility] INVASIVE CV LAB;  Service: Cardiovascular;  Laterality: N/A;   LEFT HEART CATH AND CORS/GRAFTS ANGIOGRAPHY N/A 08/21/2017   Procedure: LEFT HEART CATH AND CORS/GRAFTS ANGIOGRAPHY;  Surgeon: Tonny Bollman, MD;  Location: [facility] INVASIVE CV LAB;  Service: Cardiovascular;  Laterality: N/A;   LEFT HEART CATH AND CORS/GRAFTS ANGIOGRAPHY N/A 06/21/2018   Procedure: LEFT HEART CATH AND CORS/GRAFTS ANGIOGRAPHY;  Surgeon: Yvonne Kendall, MD;  Location: [facility] INVASIVE CV LAB;  Service: Cardiovascular;  Laterality: N/A;   POLYPECTOMY  01/22/2017   Procedure: POLYPECTOMY;  Surgeon: West Bali, MD;  Location: [facility];  Service: Endoscopy;;  Transverse(CS) and sigmoid colon(HS)   PTCA  06/1996   LAD & RCA   TEE WITHOUT CARDIOVERSION N/A 06/10/2012   Procedure: TRANSESOPHAGEAL ECHOCARDIOGRAM (TEE);  Surgeon: Wendall Stade, MD;  Location: [facility];  Service: Cardiovascular;  Laterality: N/A;  Social History:  reports that he quit smoking about 21 years ago. His smoking use included cigarettes. He has never used smokeless tobacco. He reports that he does not currently use alcohol. He reports that he does not use drugs.   Allergies  Allergen Reactions   Lisinopril Swelling and Other (See Comments)    Mouth and tongue swells     Family History  Problem Relation Age of Onset   Cancer Mother 59   Stroke Daughter      Prior to Admission medications   Medication Sig Start Date End Date Taking? Authorizing Provider  atorvastatin (LIPITOR) 80 MG tablet Take 1 tablet (80 mg total) by mouth daily at 6 PM. 03/01/23   Swayze, Ava, DO  Cal Carb-Mag Hydrox-Simeth (ROLAIDS ADVANCED) 1000-200-40 MG CHEW Chew 1-2 tablets by mouth daily as needed (heartburn).    [provider]  clopidogrel (PLAVIX) 75 MG tablet Take 75 mg by mouth daily. Patient not taking: Reported on 02/28/2023    [provider]  empagliflozin (JARDIANCE) 10 MG TABS tablet TAKE 1 TABLET BY MOUTH EVERY DAY BEFORE BREAKFAST Patient taking differently: Take 10 mg by mouth daily. 12/23/22   Pricilla Riffle, MD  enoxaparin (LOVENOX) 100 MG/ML injection Inject 0.9 mLs (90 mg total) into the skin every 12 (twelve) hours. 03/01/23   Swayze, Ava, DO  furosemide (LASIX) 20 MG tablet TAKE 3 TABLETS BY MOUTH EVERY DAY Patient taking differently: Take 40 mg by mouth daily. 10/02/20   Pricilla Riffle, MD  isosorbide mononitrate (IMDUR) 60 MG 24 hr tablet Take 1 tablet (60 mg total) by mouth daily. 09/12/20 03/09/23  Pricilla Riffle, MD  metoprolol succinate (TOPROL-XL) 25 MG 24 hr tablet Take 1 tablet (25 mg total) by mouth daily. 03/01/23   Swayze, Ava, DO  nitroGLYCERIN (NITROSTAT) 0.4 MG SL tablet Place 1 tablet (0.4 mg total) under the tongue every 5 (five) minutes as needed for chest pain. up to 3 doses. 07/05/13   Jodelle Gross, NP  spironolactone (ALDACTONE) 25 MG tablet TAKE 1 TABLET (25 MG TOTAL) BY MOUTH DAILY. 08/15/22 08/10/23  Ellsworth Lennox, PA-C    Physical Exam: BP (!) 147/69 (BP Location: Left Arm)   Pulse 68   Temp (!) 97.5 F (36.4 C) (Oral)   Resp 15   Ht 5\' 6"  (1.676 m)   Wt 94 kg   SpO2 99%   BMI 33.45 kg/m   General: 77 y.o. year-old male well developed well nourished in no acute distress.  Alert and oriented x3. HEENT:  NCAT, EOMI Neck: Supple, trachea medial Cardiovascular: Regular rate and rhythm with no rubs or gallops.  No thyromegaly or JVD noted.  No lower extremity edema. 2/4 pulses in all 4 extremities. Respiratory: Clear to auscultation with no wheezes or rales. Good inspiratory effort. Abdomen: Soft, nontender nondistended with normal bowel sounds x4 quadrants. Muskuloskeletal: No cyanosis, clubbing or edema noted bilaterally Neuro: CN II-XII intact, strength 5/5 x 4, sensation, reflexes intact Skin: No ulcerative lesions noted or rashes Psychiatry: Judgement and insight appear normal. Mood is appropriate for condition and setting          Labs on Admission:  Basic Metabolic Panel: Recent Labs  Lab 04/06/23 0043  NA 138  K 3.6  CL 103  CO2 27  GLUCOSE 102*  BUN 16  CREATININE 1.52*  CALCIUM 9.4   Liver Function Tests: Recent Labs  Lab 04/06/23 0043  AST 31  ALT 59*  ALKPHOS 47  BILITOT 1.2*  PROT 7.9  ALBUMIN 4.2   No results for input(s): "LIPASE", "AMYLASE" in the last 168 hours. No results for input(s): "AMMONIA" in the last 168 hours. CBC: Recent Labs  Lab 04/06/23 0043  WBC 6.3  NEUTROABS 2.3  HGB 16.6  HCT 51.7  MCV 91.5  PLT 168   Cardiac Enzymes: No results for input(s): "CKTOTAL", "CKMB", "CKMBINDEX", "TROPONINI" in the last 168 hours.  BNP (last 3 results) No results for input(s): "BNP" in the last 8760 hours.  ProBNP (last 3 results) No results for input(s): "PROBNP" in the last 8760 hours.  CBG: Recent Labs  Lab 04/06/23 0106  GLUCAP 116*    Radiological Exams on Admission: CT HEAD CODE STROKE WO CONTRAST Result Date: 04/06/2023 CLINICAL DATA:  Code stroke.  Left facial numbness EXAM: CT HEAD WITHOUT CONTRAST TECHNIQUE: Contiguous axial images were obtained from the base of the skull through the vertex without intravenous contrast. RADIATION DOSE REDUCTION: This exam was performed according to the departmental dose-optimization program which  includes automated exposure control, adjustment of the mA and/or kV according to patient size and/or use of iterative reconstruction technique. COMPARISON:  None Available. FINDINGS: Brain: There is no mass, hemorrhage or extra-axial collection. The size and configuration of the ventricles and extra-axial CSF spaces are normal. There is hypoattenuation of the periventricular white matter, most commonly indicating chronic ischemic microangiopathy. There are old left frontal and occipital infarcts. Old right cerebellar infarct. Vascular: No abnormal hyperdensity of the major intracranial arteries or dural venous sinuses. No intracranial atherosclerosis. Skull: The visualized skull base, calvarium and extracranial soft tissues are normal. Sinuses/Orbits: No fluid levels or advanced mucosal thickening of the visualized paranasal sinuses. No mastoid or middle ear effusion. The orbits are normal. ASPECTS [facility] Stroke Program Early CT Score) - Ganglionic level infarction (caudate, lentiform nuclei, internal capsule, insula, M1-M3 cortex): 7 - Supraganglionic infarction (M4-M6 cortex): 3 Total score (0-10 with 10 being normal): 10 IMPRESSION: 1. No acute intracranial abnormality. 2. ASPECTS is 10. 3. Old left frontal and occipital infarcts. Old right cerebellar infarct. These results were called by telephone at the time of interpretation on 04/06/2023 at 1:07 am to provider [facility] , who verbally acknowledged these results. Electronically Signed   By: Deatra Robinson M.D.   On: 04/06/2023 01:08    EKG: I independently viewed the EKG done and my findings are as followed: Normal sinus rhythm at rate of 81 bpm with paired VPCs and RBBB  Assessment/Plan Present on Admission:  Numbness and tingling of left side of face  Apical mural thrombus  Pulmonary embolism (HCC)  Chronic kidney disease, stage 3a (HCC)  CAD (coronary artery disease)  Essential hypertension  Chronic heart failure with mildly reduced  ejection fraction (HFmrEF, 41-49%) (HCC)  Principal Problem:   Numbness and tingling of left side of face Active Problems:   Essential hypertension   CAD (coronary artery disease)   Pulmonary embolism (HCC)   Chronic kidney disease, stage 3a (HCC)   Chronic heart failure with mildly reduced ejection fraction (HFmrEF, 41-49%) (HCC)   Apical mural thrombus   Obesity (BMI 30-39.9)  Numbness and tingling of left side of face, rule out acute ischemic stroke Patient will be admitted to telemetry unit  Bilateral carotid ultrasound in the morning Echocardiogram in the morning MRI of brain without contrast in the morning Continue home Lovenox, Plavix and atorvastatin 80 mg daily per teleneurology recommendation Continue fall precautions and neuro checks Lipid panel, TSH and hemoglobin A1c  will be checked Continue PT/OT eval and treat Patient passed bedside swallow eval by nursing  Neurology will be consulted and we shall await further recommendations  Class I obesity (BMI 33.45) Diet and lifestyle modification  CKD stage 3A Creatinine of 1.52 (creatinine is within baseline range) Renally adjust medications, avoid nephrotoxic agents/dehydration/hypotension  Apical mural thrombus/history of PE Patient failed Eliquis and warfarin, now on Lovenox Continue Lovenox 1 mg/kilogram every 12  CAD Continue Plavix and Lipitor  Essential hypertension  Antihypertensives PRN if Blood pressure is greater than 220/120 or there is a concern for End organ damage/contraindications for permissive HTN. If blood pressure is greater than 220/120 give labetalol PO or IV or Vasotec IV with a goal of 15% reduction in BP during the first 24 hours.  Chronic HFrEF Patient appears compensated Continue total input/output, daily weights and fluid restriction   DVT prophylaxis: Lovenox  Code Status: Full code  Family Communication: Daughter at bedside (all questions answered to satisfaction)  Consults:  Teleneurology  Severity of Illness: The appropriate patient status for this patient is OBSERVATION. Observation status is judged to be reasonable and necessary in order to provide the required intensity of service to ensure the patient's safety. The patient's presenting symptoms, physical exam findings, and initial radiographic and laboratory data in the context of their medical condition is felt to place them at decreased risk for further clinical deterioration. Furthermore, it is anticipated that the patient will be medically stable for discharge from the hospital within 2 midnights of admission.   Author: Frankey Shown, DO 04/06/2023 3:30 AM  For on call review [email or web address].

## 2023-04-06 NOTE — Progress Notes (Signed)
Code Stroke activated 0053. Per bedside RN patient left sided facial numbness. Patient denies any new weakness or trouble walking or speaking states history of stroke and is on blood thinners last dose was 9p on 12/29. Patient states mRs is 0. VAN Negative.   To CT at 0055. Returned from CT at 0103.  Telespecialist paged at 0104. [facility] on camera for report and neuro eval at 0106. NNCT report given at 0113 as negative to TSMD. TSMD states patient is on thinners and is not a current TNK candidate and will reach out to edp for further recommendations. Off camera at 0117.

## 2023-04-06 NOTE — Evaluation (Addendum)
Physical Therapy Evaluation Patient Details Name: [name] MRN: [id number] DOB: 14-Jun-1945 Today's Date: 04/06/2023  History of Present Illness  [name] is a 77 y.o. 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  Left leg already resolved, left shoulder has improved, but patient still complaining of mild tingling on left side of face.  Patient was recently admitted for similar presentation from 11/23 to 11/24 during which patient presented with left facial numbness.  MRI done at that time was negative for acute pathology.   Clinical Impression  Patient functioning at baseline for functional mobility and gait demonstrating good return for ambulation in room/hallway without loss of balance and only c/o decreased sensation for light touch on left side and possible baseline left shoulder weakness/pain at end range flexion.  Plan:  Patient discharged from physical therapy to care of nursing for ambulation daily as tolerated for length of stay.          If plan is discharge home, recommend the following: Other (comment) (Patient at baseline)   Can travel by private vehicle        Equipment Recommendations None recommended by PT  Recommendations for Other Services       Functional Status Assessment Patient has not had a recent decline in their functional status     Precautions / Restrictions Precautions Precautions: Fall Restrictions Weight Bearing Restrictions Per Provider Order: No      Mobility  Bed Mobility Overal bed mobility: Independent                  Transfers Overall transfer level: Independent                      Ambulation/Gait Ambulation/Gait assistance: Modified independent (Device/Increase time),  Independent Gait Distance (Feet): 100 Feet Assistive device: None Gait Pattern/deviations: WFL(Within Functional Limits) Gait velocity: slightly decreased     General Gait Details: Patient demonstrates good return for ambulating in room, hallway without loss of balance  Stairs            Wheelchair Mobility     Tilt Bed    Modified Rankin (Stroke Patients Only)       Balance Overall balance assessment: No apparent balance deficits (not formally assessed)                                           Pertinent Vitals/Pain Pain Assessment Pain Assessment: Faces Faces Pain Scale: Hurts little more Pain Location: L shoulder with movement. Pain Descriptors / Indicators: Grimacing, Guarding Pain Intervention(s): Limited activity within patient's tolerance, Monitored during session, Repositioned    Home Living Family/patient expects to be discharged to:: Private residence Living Arrangements: Children Available Help at Discharge: Family;Available 24 hours/day Type of Home: Apartment Home Access: Level entry       Home Layout: One level Home Equipment: None Additional Comments: Pt and daughter live together in appartment.    Prior Function Prior Level of Function : Independent/Modified Independent             Mobility Comments: Independent without AD ADLs Comments: independent with driving, dressing, bathing and dressing. Daughter cooks.     Extremity/Trunk  Assessment   Upper Extremity Assessment Upper Extremity Assessment: Defer to OT evaluation    Lower Extremity Assessment Lower Extremity Assessment: Overall WFL for tasks assessed    Cervical / Trunk Assessment Cervical / Trunk Assessment: Normal  Communication   Communication Communication: Hearing impairment Cueing Techniques: Verbal cues;Tactile cues  Cognition Arousal: Alert Behavior During Therapy: WFL for tasks assessed/performed Overall Cognitive Status: Within  Functional Limits for tasks assessed                                          General Comments      Exercises     Assessment/Plan    PT Assessment Patient does not need any further PT services  PT Problem List         PT Treatment Interventions      PT Goals (Current goals can be found in the Care Plan section)  Acute Rehab PT Goals Patient Stated Goal: return home PT Goal Formulation: With patient/family Time For Goal Achievement: 04/06/23 Potential to Achieve Goals: Good    Frequency       Co-evaluation PT/OT/SLP Co-Evaluation/Treatment: Yes Reason for Co-Treatment: To address functional/ADL transfers PT goals addressed during session: Mobility/safety with mobility;Balance         AM-PAC PT "6 Clicks" Mobility  Outcome Measure Help needed turning from your back to your side while in a flat bed without using bedrails?: None Help needed moving from lying on your back to sitting on the side of a flat bed without using bedrails?: None Help needed moving to and from a bed to a chair (including a wheelchair)?: None Help needed standing up from a chair using your arms (e.g., wheelchair or bedside chair)?: None Help needed to walk in hospital room?: None Help needed climbing 3-5 steps with a railing? : None 6 Click Score: 24    End of Session   Activity Tolerance: Patient tolerated treatment well Patient left: in chair;with family/visitor present Nurse Communication: Mobility status PT Visit Diagnosis: Unsteadiness on feet (R26.81);Other abnormalities of gait and mobility (R26.89);Muscle weakness (generalized) (M62.81)    Time: 1610-9604 PT Time Calculation (min) (ACUTE ONLY): 27 min   Charges:   PT Evaluation $PT Eval Moderate Complexity: 1 Mod PT Treatments $Therapeutic Activity: 23-37 mins PT General Charges $$ ACUTE PT VISIT: 1 Visit         1:42 PM, 04/06/23 [name], MPT Physical Therapist with [facility] [phone] office [phone] mobile phone

## 2023-04-06 NOTE — Discharge Summary (Signed)
Physician Discharge Summary  DARYLL DOTO WUJ:[id number] DOB: 05-18-1945 DOA: 04/06/2023  PCP: Benetta Spar, MD  Admit date: 04/06/2023 Discharge date: 04/06/2023  Admitted From: Home Disposition: Home with home health  Recommendations for Outpatient Follow-up:  Follow up with PCP in 1-2 weeks Please obtain BMP/CBC in one week Continue using injection Lovenox without interruption. Will send referral to neurology for follow-up.  Home Health: PT/OT Equipment/Devices: Present at home  Discharge Condition: Stable CODE STATUS: Full code Diet recommendation: Low-salt diet  Discharge summary: 77 year old gentleman with history of hypertension, hyperlipidemia, previous history of stroke, chronic congestive heart failure with known ejection fraction 35%, apical mural thrombus and failed oral anticoagulation currently on Lovenox presented to the emergency room with sudden onset of left-sided facial numbness and weakness of the left shoulder and leg.  Brought to the ER as code stroke.  Patient is on therapeutic Lovenox and improvement of symptoms by the time he came to ER so not a candidate for thrombolysis.  Seen by telemetry neuro.  Admitted for observation and further workup.  TIA: Neurologically improved.  Patient only has some facial numbness on the left side subjectively.  Neurologically stable. Clinical findings, sudden onset left facial numbness and left-sided weakness.  Improved. CT head findings, old ischemic strokes.  No new stroke.  No hemorrhage. MRI of the brain, no evidence of acute intracranial abnormalities.  Multiple chronic infarcts within the supratentorial and infratentorial brain. Patient had similar MRI on 11/23, 10/21 for similar presentation. Carotid Doppler, done.  Pending.  CT angiogram 11/23 with moderate atherosclerotic burden. 2D echocardiogram, ejection fraction 35 to 40%.  Apical aneurysm left ventricular apical thrombus less apparent than previous  echocardiogram from 11/23.  Likely improving. Antiplatelet therapy, patient on Lovenox and Plavix at home.  Will continue. LDL, very low at 23.  Use atorvastatin 40 mg. Hemoglobin A1c, 5.5.  No indication for treatment. Patient with neurological improvement, probably suffered TIA.  Improved ventricular thrombus.  Plan: Continue therapeutic Lovenox indefinitely, continue Plavix. Atorvastatin 40 mg daily. Home health PT OT. Will send referral to neurology office for follow-up. Cardiology already following for apical thrombus. Blood pressures are stable, resume antihypertensives. Heart failure is well compensated, currently does not need any additional diuretics.  Patient is on Lasix 60 mg daily. Kidney function test are stable.      Discharge Diagnoses:  Principal Problem:   Numbness and tingling of left side of face Active Problems:   Essential hypertension   CAD (coronary artery disease)   Pulmonary embolism (HCC)   Chronic kidney disease, stage 3a (HCC)   Chronic heart failure with mildly reduced ejection fraction (HFmrEF, 41-49%) (HCC)   Apical mural thrombus   Obesity (BMI 30-39.9)    Discharge Instructions  Discharge Instructions     Ambulatory referral to Neurology   Complete by: As directed    An appointment is requested in approximately: 4 weeks   Diet - low sodium heart healthy   Complete by: As directed    Increase activity slowly   Complete by: As directed       Allergies as of 04/06/2023       Reactions   Lisinopril Swelling, Other (See Comments)   Mouth and tongue swells        Medication List     TAKE these medications    atorvastatin 40 MG tablet Commonly known as: LIPITOR Take 40 mg by mouth daily. What changed: Another medication with the same name was removed. Continue taking this medication,  and follow the directions you see here.   clopidogrel 75 MG tablet Commonly known as: PLAVIX Take 75 mg by mouth daily.   enoxaparin 100  MG/ML injection Commonly known as: LOVENOX Inject 0.9 mLs (90 mg total) into the skin every 12 (twelve) hours.   furosemide 20 MG tablet Commonly known as: LASIX TAKE 3 TABLETS BY MOUTH EVERY DAY What changed: how much to take   hydrALAZINE 50 MG tablet Commonly known as: APRESOLINE Take 50 mg by mouth 3 (three) times daily.   isosorbide mononitrate 60 MG 24 hr tablet Commonly known as: IMDUR Take 1 tablet (60 mg total) by mouth daily.   Jardiance 10 MG Tabs tablet Generic drug: empagliflozin TAKE 1 TABLET BY MOUTH EVERY DAY BEFORE BREAKFAST What changed: See the new instructions.   metoprolol succinate 25 MG 24 hr tablet Commonly known as: TOPROL-XL Take 1 tablet (25 mg total) by mouth daily.   nitroGLYCERIN 0.4 MG SL tablet Commonly known as: NITROSTAT Place 1 tablet (0.4 mg total) under the tongue every 5 (five) minutes as needed for chest pain. up to 3 doses.   Rolaids Advanced 1000-200-40 MG Chew Generic drug: Cal Carb-Mag Hydrox-Simeth Chew 1-2 tablets by mouth daily as needed (heartburn).   spironolactone 25 MG tablet Commonly known as: ALDACTONE TAKE 1 TABLET (25 MG TOTAL) BY MOUTH DAILY.        Allergies  Allergen Reactions   Lisinopril Swelling and Other (See Comments)    Mouth and tongue swells    Consultations: Teleneurology in the ER   Procedures/Studies: US Carotid Bilateral Result Date: 04/06/2023 CLINICAL DATA:  77 year old male with acute stroke EXAM: BILATERAL CAROTID DUPLEX ULTRASOUND TECHNIQUE: Wallace Cullens scale imaging, color Doppler and duplex ultrasound were performed of bilateral carotid and vertebral arteries in the neck. COMPARISON:  None Available. FINDINGS: Criteria: Quantification of carotid stenosis is based on velocity parameters that correlate the residual internal carotid diameter with NASCET-based stenosis levels, using the diameter of the distal internal carotid lumen as the denominator for stenosis measurement. The following velocity  measurements were obtained: RIGHT ICA:  Systolic 62 cm/sec, Diastolic 18 cm/sec CCA:  85 cm/sec SYSTOLIC ICA/CCA RATIO:  0.7 ECA:  118 cm/sec LEFT ICA:  Systolic 84 cm/sec, Diastolic 19 cm/sec CCA:  108 cm/sec SYSTOLIC ICA/CCA RATIO:  1.0 ECA:  67 cm/sec Right Brachial SBP: Not acquired Left Brachial SBP: Not acquired RIGHT CAROTID ARTERY: No significant calcifications of the right common carotid artery. Intermediate waveform maintained. Heterogeneous and partially calcified plaque at the right carotid bifurcation. No significant lumen shadowing. Low resistance waveform of the right ICA. Tortuosity RIGHT VERTEBRAL ARTERY: Antegrade flow with low resistance waveform. LEFT CAROTID ARTERY: No significant calcifications of the left common carotid artery. Intermediate waveform maintained. Heterogeneous and partially calcified plaque at the left carotid bifurcation. No significant lumen shadowing. Low resistance waveform of the left ICA. Tortuosity LEFT VERTEBRAL ARTERY:  Antegrade flow with low resistance waveform. IMPRESSION: Color duplex indicates bilateral heterogeneous and calcified plaque, with no hemodynamically significant stenosis by duplex criteria in the extracranial cerebrovascular circulation. Signed, Yvone Neu. Miachel Roux, RPVI [facility] Electronically Signed   By: Gilmer Mor D.O.   On: 04/06/2023 12:58   ECHOCARDIOGRAM COMPLETE Result Date: 04/06/2023    ECHOCARDIOGRAM REPORT   Patient Name:   [name] Date of Exam: 04/06/2023 Medical Rec #:  [id number]       Height:       66.0 in Accession #:  [id number]      Weight:       204.6 lb Date of Birth:  [birth year]       BSA:          2.019 m Patient Age:    77 years        BP:           119/66 mmHg Patient Gender: M               HR:           61 bpm. [facility] Procedure: 2D Echo, Cardiac Doppler, Color Doppler and Intracardiac            Opacification Agent  Indications:    Stroke l63.9  History:        Patient has prior history of Echocardiogram examinations, most                 recent 02/28/2023. CHF and Cardiomyopathy, CAD and Previous                 Myocardial Infarction, Prior CABG; Risk Factors:Hypertension and                 Dyslipidemia. Pneumonia due to COVID-19 virus. Hx of LV apical                 thrombus.  Sonographer:    Celesta Gentile RCS Referring Phys: [id number] OLADAPO ADEFESO IMPRESSIONS  1. Septal / apical aneurysm LV apical thrombsu not as apparent with definity as TTE done 02/28/23 Likely small amount residual mural thrombus. would continue anticoagulation with repeat echo in 3 months vs cardiac MRI. Left ventricular ejection fraction, by estimation, is 35 to 40%. The left ventricle has moderately decreased function. The left ventricle demonstrates regional wall motion abnormalities (see scoring diagram/findings for description). The left ventricular internal cavity size was moderately dilated. There is mild left ventricular hypertrophy. Left ventricular diastolic parameters were normal.  2. Right ventricular systolic function is normal. The right ventricular size is normal.  3. Left atrial size was severely dilated.  4. The mitral valve is normal in structure. No evidence of mitral valve regurgitation. No evidence of mitral stenosis.  5. The aortic valve is tricuspid. There is mild calcification of the aortic valve. Aortic valve regurgitation is not visualized. Aortic valve sclerosis is present, with no evidence of aortic valve stenosis.  6. The inferior vena cava is normal in size with greater than 50% respiratory variability, suggesting right atrial pressure of 3 mmHg. FINDINGS  Left Ventricle: Septal / apical aneurysm LV apical thrombsu not as apparent with definity as TTE done 02/28/23 Likely small amount residual mural thrombus. would continue anticoagulation with repeat echo in 3 months vs cardiac MRI. Left ventricular ejection fraction, by  estimation, is 35 to 40%. The left ventricle has moderately decreased function. The left ventricle demonstrates regional wall motion abnormalities. Definity contrast agent was given IV to delineate the left ventricular endocardial borders. The left ventricular internal cavity size was moderately dilated. There is mild left ventricular hypertrophy. Left ventricular diastolic parameters were normal. Right Ventricle: The right ventricular size is normal. No increase in right ventricular wall thickness. Right ventricular systolic function is normal. Left Atrium: Left atrial size was severely dilated. Right Atrium: Right atrial size was normal in size. Pericardium: There is no evidence of pericardial effusion. Mitral Valve: The mitral valve is normal in structure. No evidence of mitral valve regurgitation. No evidence of mitral valve stenosis.  Tricuspid Valve: The tricuspid valve is normal in structure. Tricuspid valve regurgitation is trivial. No evidence of tricuspid stenosis. Aortic Valve: The aortic valve is tricuspid. There is mild calcification of the aortic valve. Aortic valve regurgitation is not visualized. Aortic valve sclerosis is present, with no evidence of aortic valve stenosis. Pulmonic Valve: The pulmonic valve was normal in structure. Pulmonic valve regurgitation is not visualized. No evidence of pulmonic stenosis. Aorta: The aortic root is normal in size and structure. Venous: The inferior vena cava is normal in size with greater than 50% respiratory variability, suggesting right atrial pressure of 3 mmHg. IAS/Shunts: No atrial level shunt detected by color flow Doppler.  LEFT VENTRICLE PLAX 2D LVIDd:         5.00 cm   Diastology LVIDs:         3.90 cm   LV e' medial:    5.11 cm/s LV PW:         1.00 cm   LV E/e' medial:  14.2 LV IVS:        1.20 cm   LV e' lateral:   6.71 cm/s LVOT diam:     2.00 cm   LV E/e' lateral: 10.8 LV SV:         59 LV SV Index:   29 LVOT Area:     3.14 cm  RIGHT VENTRICLE RV  S prime:     10.00 cm/s TAPSE (M-mode): 1.6 cm LEFT ATRIUM             Index        RIGHT ATRIUM           Index LA diam:        5.00 cm 2.48 cm/m   RA Area:     22.40 cm LA Vol (A2C):   72.0 ml 35.66 ml/m  RA Volume:   67.10 ml  33.23 ml/m LA Vol (A4C):   84.7 ml 41.95 ml/m LA Biplane Vol: 81.3 ml 40.26 ml/m  AORTIC VALVE LVOT Vmax:   77.70 cm/s LVOT Vmean:  46.700 cm/s LVOT VTI:    0.187 m  AORTA Ao Root diam: 3.70 cm MITRAL VALVE               TRICUSPID VALVE MV Area (PHT): 3.21 cm    TR Peak grad:   37.2 mmHg MV Decel Time: 236 msec    TR Vmax:        305.00 cm/s MV E velocity: 72.40 cm/s MV A velocity: 82.30 cm/s  SHUNTS MV E/A ratio:  0.88        Systemic VTI:  0.19 m                            Systemic Diam: 2.00 cm Charlton Haws MD Electronically signed by Charlton Haws MD Signature Date/Time: 04/06/2023/12:08:38 PM    Final    MR BRAIN WO CONTRAST Result Date: 04/06/2023 CLINICAL DATA:  Provided history: Neuro deficit, acute, stroke suspected. EXAM: MRI HEAD WITHOUT CONTRAST TECHNIQUE: Multiplanar, multiecho pulse sequences of the brain and surrounding structures were obtained without intravenous contrast. COMPARISON:  Head CT 04/06/2023.  Brain MRI 02/28/2023. FINDINGS: Mild intermittent motion degradation. Within this limitation, findings are as follows. Brain: Mild generalized parenchymal volume loss. Small chronic cortical/subcortical infarct within the mid-to-anterior left frontal lobe/insula (MCA territory). Small chronic cortical/subcortical infarcts within the bilateral occipital lobes (PCA territory). Chronic lacunar infarct within the left retrolenticular white matter. Mild-to-moderate  multifocal T2 FLAIR hyperintense signal abnormality elsewhere within the cerebral white matter, nonspecific but compatible with chronic small vessel ischemic disease. Chronic lacunar infarct within the left thalamus. Minimal chronic small vessel ischemic changes within the pons. Moderate-sized chronic  infarct within the inferior right cerebellar hemisphere (PICA territory). Associated chronic hemosiderin deposition at this site. Multiple tiny chronic infarcts within the left cerebellar hemisphere. There is no acute infarct. No evidence of an intracranial mass. No extra-axial fluid collection. No midline shift. Vascular: Maintained flow voids within the proximal large arterial vessels. Skull and upper cervical spine: No focal worrisome marrow lesion. Incompletely assessed cervical spondylosis. Sinuses/Orbits: No mass or acute finding within the imaged orbits. Mild, diffuse paranasal sinus mucosal thickening. Other: Trace fluid within the bilateral mastoid air cells. IMPRESSION: 1. Mildly motion degraded exam. 2. No evidence of an acute intracranial abnormality. 3. Multiple chronic infarcts within the supratentorial and infratentorial brain, unchanged from the prior MRI of 02/28/2023. 4. Background parenchymal atrophy and chronic small vessel ischemic disease, similar to the prior MRI. 5. Mild paranasal sinus mucosal thickening. Electronically Signed   By: Jackey Loge D.O.   On: 04/06/2023 10:42   CT HEAD CODE STROKE WO CONTRAST Result Date: 04/06/2023 CLINICAL DATA:  Code stroke.  Left facial numbness EXAM: CT HEAD WITHOUT CONTRAST TECHNIQUE: Contiguous axial images were obtained from the base of the skull through the vertex without intravenous contrast. RADIATION DOSE REDUCTION: This exam was performed according to the departmental dose-optimization program which includes automated exposure control, adjustment of the mA and/or kV according to patient size and/or use of iterative reconstruction technique. COMPARISON:  None Available. FINDINGS: Brain: There is no mass, hemorrhage or extra-axial collection. The size and configuration of the ventricles and extra-axial CSF spaces are normal. There is hypoattenuation of the periventricular white matter, most commonly indicating chronic ischemic microangiopathy.  There are old left frontal and occipital infarcts. Old right cerebellar infarct. Vascular: No abnormal hyperdensity of the major intracranial arteries or dural venous sinuses. No intracranial atherosclerosis. Skull: The visualized skull base, calvarium and extracranial soft tissues are normal. Sinuses/Orbits: No fluid levels or advanced mucosal thickening of the visualized paranasal sinuses. No mastoid or middle ear effusion. The orbits are normal. ASPECTS [facility] Stroke Program Early CT Score) - Ganglionic level infarction (caudate, lentiform nuclei, internal capsule, insula, M1-M3 cortex): 7 - Supraganglionic infarction (M4-M6 cortex): 3 Total score (0-10 with 10 being normal): 10 IMPRESSION: 1. No acute intracranial abnormality. 2. ASPECTS is 10. 3. Old left frontal and occipital infarcts. Old right cerebellar infarct. These results were called by telephone at the time of interpretation on 04/06/2023 at 1:07 am to provider [facility] , who verbally acknowledged these results. Electronically Signed   By: Deatra Robinson M.D.   On: 04/06/2023 01:08   (Echo, Carotid, EGD, Colonoscopy, ERCP)    Subjective: Patient seen and examined.  He was returning from MRI.  Patient tells me he has some funny feeling on the left side of the face but denies any weakness of the legs or arms.  Daughter was at the bedside.   Discharge Exam: Vitals:   04/06/23 0755 04/06/23 1205  BP: 119/66 137/77  Pulse:    Resp:    Temp: 97.6 F (36.4 C) (!) 97.4 F (36.3 C)  SpO2:     Vitals:   04/06/23 0325 04/06/23 0745 04/06/23 0755 04/06/23 1205  BP: (!) 147/69  119/66 137/77  Pulse: 68 61    Resp: 15 12    Temp: (!)  97.5 F (36.4 C)  97.6 F (36.4 C) (!) 97.4 F (36.3 C)  TempSrc: Oral  Oral Oral  SpO2: 99% 98%    Weight: 92.8 kg     Height: 5\' 6"  (1.676 m)       General: Pt is alert, awake, not in acute distress Hard of hearing.  Chronically sick looking but not in any distress.  On room  air. Cardiovascular: RRR, S1/S2 +, no rubs, no gallops Respiratory: CTA bilaterally, no wheezing, no rhonchi Abdominal: Soft, NT, ND, bowel sounds +    The results of significant diagnostics from this hospitalization (including imaging, microbiology, ancillary and laboratory) are listed below for reference.     Microbiology: No results found for this or any previous visit (from the past 240 hours).   Labs: BNP (last 3 results) No results for input(s): "BNP" in the last 8760 hours. Basic Metabolic Panel: Recent Labs  Lab 04/06/23 0043 04/06/23 0505  NA 138 138  K 3.6 3.5  CL 103 104  CO2 27 23  GLUCOSE 102* 95  BUN 16 15  CREATININE 1.52* 1.29*  CALCIUM 9.4 9.1  MG  --  1.9  PHOS  --  3.6   Liver Function Tests: Recent Labs  Lab 04/06/23 0043 04/06/23 0505  AST 31 29  ALT 59* 55*  ALKPHOS 47 40  BILITOT 1.2* 1.2*  PROT 7.9 7.5  ALBUMIN 4.2 3.9   No results for input(s): "LIPASE", "AMYLASE" in the last 168 hours. No results for input(s): "AMMONIA" in the last 168 hours. CBC: Recent Labs  Lab 04/06/23 0043 04/06/23 0505  WBC 6.3 5.7  NEUTROABS 2.3  --   HGB 16.6 15.9  HCT 51.7 49.3  MCV 91.5 90.0  PLT 168 163   Cardiac Enzymes: No results for input(s): "CKTOTAL", "CKMB", "CKMBINDEX", "TROPONINI" in the last 168 hours. BNP: Invalid input(s): "POCBNP" CBG: Recent Labs  Lab 04/06/23 0106  GLUCAP 116*   D-Dimer No results for input(s): "DDIMER" in the last 72 hours. Hgb A1c No results for input(s): "HGBA1C" in the last 72 hours. Lipid Profile Recent Labs    04/06/23 0505  CHOL 79  HDL 40*  LDLCALC 23  TRIG 80  CHOLHDL 2.0   Thyroid function studies Recent Labs    04/06/23 0505  TSH 1.957   Anemia work up No results for input(s): "VITAMINB12", "FOLATE", "FERRITIN", "TIBC", "IRON", "RETICCTPCT" in the last 72 hours. Urinalysis    Component Value Date/Time   COLORURINE STRAW (A) 01/26/2023 1047   APPEARANCEUR CLEAR 01/26/2023 1047    LABSPEC 1.009 01/26/2023 1047   PHURINE 5.0 01/26/2023 1047   GLUCOSEU >=500 (A) 01/26/2023 1047   HGBUR NEGATIVE 01/26/2023 1047   BILIRUBINUR NEGATIVE 01/26/2023 1047   KETONESUR NEGATIVE 01/26/2023 1047   PROTEINUR NEGATIVE 01/26/2023 1047   UROBILINOGEN 1.0 03/16/2010 0652   NITRITE NEGATIVE 01/26/2023 1047   LEUKOCYTESUR NEGATIVE 01/26/2023 1047   Sepsis Labs Recent Labs  Lab 04/06/23 0043 04/06/23 0505  WBC 6.3 5.7   Microbiology No results found for this or any previous visit (from the past 240 hours).   Time coordinating discharge: 35 minutes  SIGNED:   Dorcas Carrow, MD  [facility] 04/06/2023, 1:06 PM

## 2023-04-06 NOTE — TOC Initial Note (Signed)
Transition of Care [facility]) - Initial/Assessment Note    Patient Details  Name: [name] MRN: [id number] Date of Birth: April 16, 1945  Transition of Care [facility]) CM/SW Contact:    Villa Herb, LCSWA Phone Number: 04/06/2023, 12:37 PM  Clinical Narrative:                 CSW updated that PT is recommending [facility] PT/OT for pt at D/C. CSW spoke with pts daughter who is at bedside to inquire about interest in [facility]. Pts daughter states they are agreeable and do not have an agency preference. CSW spoke to [place] with [facility] who accepted the referral. CSW to request MD place [facility] PT/OT orders. TOC to follow.   Expected Discharge Plan: Home w Home Health Services Barriers to Discharge: Continued Medical Work up   Patient Goals and CMS Choice Patient states their goals for this hospitalization and ongoing recovery are:: return home CMS Medicare.gov Compare Post Acute Care list provided to:: Patient Choice offered to / list presented to : Patient, Adult Children      Expected Discharge Plan and Services In-house Referral: Clinical Social Work Discharge Planning Services: CM Consult Post Acute Care Choice: Home Health Living arrangements for the past 2 months: Apartment                           HH Arranged: PT, OT HH Agency: [facility] Date HH Agency Contacted: 04/06/23   Representative spoke with at [facility] Agency: Adele Dan  Prior Living Arrangements/Services Living arrangements for the past 2 months: Apartment Lives with:: Self Patient language and need for interpreter reviewed:: Yes Do you feel safe going back to the place where you live?: Yes      Need for Family Participation in Patient Care: Yes (Comment) Care giver support system in place?: Yes (comment)   Criminal Activity/Legal Involvement Pertinent to Current Situation/Hospitalization: No - Comment as needed  Activities of Daily Living   ADL Screening (condition at time of  admission) Independently performs ADLs?: Yes (appropriate for developmental age) Is the patient deaf or have difficulty hearing?: Yes Does the patient have difficulty seeing, even when wearing glasses/contacts?: No Does the patient have difficulty concentrating, remembering, or making decisions?: No  Permission Sought/Granted                  Emotional Assessment Appearance:: Appears stated age Attitude/Demeanor/Rapport: Engaged Affect (typically observed): Accepting Orientation: : Oriented to Self, Oriented to Place, Oriented to  Time, Oriented to Situation Alcohol / Substance Use: Not Applicable Psych Involvement: No (comment)  Admission diagnosis:  Paresthesia [R20.2] Numbness and tingling of left side of face [R20.0, R20.2] Patient Active Problem List   Diagnosis Date Noted   Numbness and tingling of left side of face 04/06/2023   Obesity (BMI 30-39.9) 04/06/2023   Left facial numbness 02/28/2023   Left arm numbness 07/01/2022   Sensory disturbance 07/01/2022   Thrombocytopenia (HCC) 10/18/2020   Elevated lipase 10/18/2020   Elevated alanine aminotransferase (ALT) level 10/18/2020   Hx of CABG 02/22/2020   Ischemic cardiomyopathy 02/22/2020   Apical mural thrombus 02/22/2020   History of stroke 02/22/2020   History of pulmonary embolism 02/22/2020   Acute on chronic systolic (congestive) heart failure (HCC) 11/10/2018   Pneumonia due to COVID-19 virus 11/10/2018   NSTEMI (non-ST elevated myocardial infarction) (HCC) 06/21/2018   Chronic heart failure with mildly reduced ejection fraction (HFmrEF, 41-49%) (HCC) 08/21/2017   Abnormal  nuclear stress test    CHF exacerbation (HCC) 03/20/2017   Cerebral thrombosis with cerebral infarction 03/08/2017   TIA (transient ischemic attack) 03/08/2017   Visual field defect of right eye 03/07/2017   Chronic kidney disease, stage 3a (HCC) 03/07/2017   Hyperglycemia 03/07/2017   OSA (obstructive sleep apnea) 03/07/2017    Encounter for therapeutic drug monitoring    Central sleep apnea 11/01/2012   Chronic anticoagulation-discontinued 10/19/2012   Cerebral infarction (HCC) 06/09/2012   CAD (coronary artery disease)    Pulmonary embolism (HCC) 03/07/2010   Mixed hyperlipidemia 11/24/2008   Essential hypertension 11/24/2008   PCP:  Benetta Spar, MD Pharmacy:   [facility] [phone] - [place], [place] - [address] AT [facility] [address] [place] [address] Phone: [phone] Fax: [phone]     Social Drivers of Health (SDOH) Social History: SDOH Screenings   Food Insecurity: No Food Insecurity (04/06/2023)  Housing: Low Risk  (04/06/2023)  Transportation Needs: No Transportation Needs (04/06/2023)  Utilities: Not At Risk (04/06/2023)  Alcohol Screen: Low Risk  (10/30/2020)  Depression (PHQ2-9): Low Risk  (10/30/2020)  Financial Resource Strain: Low Risk  (10/30/2020)  Physical Activity: Insufficiently Active (10/30/2020)  Social Connections: Moderately Isolated (10/30/2020)  Stress: No Stress Concern Present (10/30/2020)  Tobacco Use: Medium Risk (04/06/2023)   SDOH Interventions:     Readmission Risk Interventions     No data to display

## 2023-04-07 LAB — HEMOGLOBIN A1C
Hgb A1c MFr Bld: 5.6 % (ref 4.8–5.6)
Mean Plasma Glucose: 114 mg/dL

## 2023-04-07 NOTE — TOC Transition Note (Signed)
Transition of Care [facility]) - Discharge Note   Patient Details  Name: [name] MRN: [id number] Date of Birth: [birth year]  Transition of Care [facility]) CM/SW Contact:  Villa Herb, LCSWA Phone Number: 04/07/2023, 10:10 AM   Clinical Narrative:    CSW received updated that [facility] is no longer to accept pts insurance policy. CSW spoke to [place] with [facility] who states they can accept [facility] referral at this time. Silvio Pate states due to the holiday they will not be able to start services until Saturday due to the holiday. CSW updated Silvio Pate that a delayed start of care is fine. TOC signing off.     Barriers to Discharge: Continued Medical Work up   Patient Goals and CMS Choice Patient states their goals for this hospitalization and ongoing recovery are:: return home CMS Medicare.gov Compare Post Acute Care list provided to:: Patient Choice offered to / list presented to : Patient, Adult Children      Discharge Placement                       Discharge Plan and Services Additional resources added to the After Visit Summary for   In-house Referral: Clinical Social Work Discharge Planning Services: CM Consult Post Acute Care Choice: Home Health                    HH Arranged: PT, OT [facility] Agency: [facility] Date [facility] Agency Contacted: 04/06/23   Representative spoke with at [facility] Agency: Adele Dan  Social Drivers of Health (SDOH) Interventions SDOH Screenings   Food Insecurity: No Food Insecurity (04/06/2023)  Housing: Low Risk  (04/06/2023)  Transportation Needs: No Transportation Needs (04/06/2023)  Utilities: Not At Risk (04/06/2023)  Alcohol Screen: Low Risk  (10/30/2020)  Depression (PHQ2-9): Low Risk  (10/30/2020)  Financial Resource Strain: Low Risk  (10/30/2020)  Physical Activity: Insufficiently Active (10/30/2020)  Social Connections: Moderately Isolated (10/30/2020)  Stress: No Stress Concern Present (10/30/2020)  Tobacco Use: Medium Risk  (04/06/2023)     Readmission Risk Interventions     No data to display

## 2023-04-14 ENCOUNTER — Other Ambulatory Visit (HOSPITAL_COMMUNITY)
Admission: RE | Admit: 2023-04-14 | Discharge: 2023-04-14 | Disposition: A | Discharge status: Home / Self Care | Payer: 59 | Source: Ambulatory Visit | Attending: Internal Medicine | Admitting: Internal Medicine

## 2023-04-14 DIAGNOSIS — I10 Essential (primary) hypertension: Secondary | ICD-10-CM | POA: Insufficient documentation

## 2023-04-14 LAB — CBC WITH DIFFERENTIAL/PLATELET
Abs Immature Granulocytes: 0.01 10*3/uL (ref 0.00–0.07)
Basophils Absolute: 0.1 10*3/uL (ref 0.0–0.1)
Basophils Relative: 1 %
Eosinophils Absolute: 0.2 10*3/uL (ref 0.0–0.5)
Eosinophils Relative: 3 %
HCT: 51 % (ref 39.0–52.0)
Hemoglobin: 16.2 g/dL (ref 13.0–17.0)
Immature Granulocytes: 0 %
Lymphocytes Relative: 55 %
Lymphs Abs: 3.6 10*3/uL (ref 0.7–4.0)
MCH: 29.2 pg (ref 26.0–34.0)
MCHC: 31.8 g/dL (ref 30.0–36.0)
MCV: 91.9 fL (ref 80.0–100.0)
Monocytes Absolute: 0.9 10*3/uL (ref 0.1–1.0)
Monocytes Relative: 13 %
Neutro Abs: 1.8 10*3/uL (ref 1.7–7.7)
Neutrophils Relative %: 28 %
Platelets: 159 10*3/uL (ref 150–400)
RBC: 5.55 MIL/uL (ref 4.22–5.81)
RDW: 13.4 % (ref 11.5–15.5)
WBC: 6.5 10*3/uL (ref 4.0–10.5)
nRBC: 0 % (ref 0.0–0.2)

## 2023-04-14 LAB — BASIC METABOLIC PANEL
Anion gap: 6 (ref 5–15)
BUN: 15 mg/dL (ref 8–23)
CO2: 28 mmol/L (ref 22–32)
Calcium: 8.9 mg/dL (ref 8.9–10.3)
Chloride: 102 mmol/L (ref 98–111)
Creatinine, Ser: 1.6 mg/dL — ABNORMAL HIGH (ref 0.61–1.24)
GFR, Estimated: 44 mL/min — ABNORMAL LOW (ref >60)
Glucose, Bld: 93 mg/dL (ref 70–99)
Potassium: 3.9 mmol/L (ref 3.5–5.1)
Sodium: 136 mmol/L (ref 135–145)

## 2023-05-14 ENCOUNTER — Other Ambulatory Visit (HOSPITAL_COMMUNITY): Payer: Self-pay | Admitting: Gerontology

## 2023-05-14 ENCOUNTER — Ambulatory Visit (HOSPITAL_COMMUNITY)
Admission: RE | Admit: 2023-05-14 | Discharge: 2023-05-14 | Disposition: A | Discharge status: Home / Self Care | Payer: 59 | Source: Ambulatory Visit | Attending: Gerontology | Admitting: Gerontology

## 2023-05-14 DIAGNOSIS — M25512 Pain in left shoulder: Secondary | ICD-10-CM

## 2023-06-23 ENCOUNTER — Inpatient Hospital Stay: Attending: Hematology

## 2023-06-23 ENCOUNTER — Inpatient Hospital Stay: Payer: 59

## 2023-06-23 DIAGNOSIS — N1831 Chronic kidney disease, stage 3a: Secondary | ICD-10-CM | POA: Insufficient documentation

## 2023-06-23 DIAGNOSIS — Z7902 Long term (current) use of antithrombotics/antiplatelets: Secondary | ICD-10-CM | POA: Insufficient documentation

## 2023-06-23 DIAGNOSIS — I5022 Chronic systolic (congestive) heart failure: Secondary | ICD-10-CM | POA: Diagnosis not present

## 2023-06-23 DIAGNOSIS — M2550 Pain in unspecified joint: Secondary | ICD-10-CM | POA: Diagnosis not present

## 2023-06-23 DIAGNOSIS — I2699 Other pulmonary embolism without acute cor pulmonale: Secondary | ICD-10-CM

## 2023-06-23 DIAGNOSIS — Z87891 Personal history of nicotine dependence: Secondary | ICD-10-CM | POA: Insufficient documentation

## 2023-06-23 DIAGNOSIS — Z79899 Other long term (current) drug therapy: Secondary | ICD-10-CM | POA: Diagnosis not present

## 2023-06-23 DIAGNOSIS — Z8673 Personal history of transient ischemic attack (TIA), and cerebral infarction without residual deficits: Secondary | ICD-10-CM | POA: Diagnosis not present

## 2023-06-23 DIAGNOSIS — Z888 Allergy status to other drugs, medicaments and biological substances status: Secondary | ICD-10-CM | POA: Insufficient documentation

## 2023-06-23 DIAGNOSIS — Z823 Family history of stroke: Secondary | ICD-10-CM | POA: Diagnosis not present

## 2023-06-23 DIAGNOSIS — I2510 Atherosclerotic heart disease of native coronary artery without angina pectoris: Secondary | ICD-10-CM | POA: Insufficient documentation

## 2023-06-23 DIAGNOSIS — Z7901 Long term (current) use of anticoagulants: Secondary | ICD-10-CM | POA: Diagnosis not present

## 2023-06-23 DIAGNOSIS — R0602 Shortness of breath: Secondary | ICD-10-CM | POA: Insufficient documentation

## 2023-06-23 DIAGNOSIS — Z809 Family history of malignant neoplasm, unspecified: Secondary | ICD-10-CM | POA: Insufficient documentation

## 2023-06-23 DIAGNOSIS — G479 Sleep disorder, unspecified: Secondary | ICD-10-CM | POA: Diagnosis not present

## 2023-06-23 DIAGNOSIS — I130 Hypertensive heart and chronic kidney disease with heart failure and stage 1 through stage 4 chronic kidney disease, or unspecified chronic kidney disease: Secondary | ICD-10-CM | POA: Insufficient documentation

## 2023-06-23 DIAGNOSIS — Z86711 Personal history of pulmonary embolism: Secondary | ICD-10-CM | POA: Diagnosis not present

## 2023-06-23 DIAGNOSIS — I513 Intracardiac thrombosis, not elsewhere classified: Secondary | ICD-10-CM

## 2023-06-23 LAB — CBC WITH DIFFERENTIAL/PLATELET
Abs Immature Granulocytes: 0.01 10*3/uL (ref 0.00–0.07)
Basophils Absolute: 0.1 10*3/uL (ref 0.0–0.1)
Basophils Relative: 1 %
Eosinophils Absolute: 0.2 10*3/uL (ref 0.0–0.5)
Eosinophils Relative: 4 %
HCT: 54.3 % — ABNORMAL HIGH (ref 39.0–52.0)
Hemoglobin: 17.2 g/dL — ABNORMAL HIGH (ref 13.0–17.0)
Immature Granulocytes: 0 %
Lymphocytes Relative: 47 %
Lymphs Abs: 2.8 10*3/uL (ref 0.7–4.0)
MCH: 28.9 pg (ref 26.0–34.0)
MCHC: 31.7 g/dL (ref 30.0–36.0)
MCV: 91.1 fL (ref 80.0–100.0)
Monocytes Absolute: 0.8 10*3/uL (ref 0.1–1.0)
Monocytes Relative: 14 %
Neutro Abs: 2 10*3/uL (ref 1.7–7.7)
Neutrophils Relative %: 34 %
Platelets: 170 10*3/uL (ref 150–400)
RBC: 5.96 MIL/uL — ABNORMAL HIGH (ref 4.22–5.81)
RDW: 13.4 % (ref 11.5–15.5)
WBC: 5.9 10*3/uL (ref 4.0–10.5)
nRBC: 0 % (ref 0.0–0.2)

## 2023-06-23 LAB — BASIC METABOLIC PANEL
Anion gap: 10 (ref 5–15)
BUN: 18 mg/dL (ref 8–23)
CO2: 29 mmol/L (ref 22–32)
Calcium: 9.4 mg/dL (ref 8.9–10.3)
Chloride: 101 mmol/L (ref 98–111)
Creatinine, Ser: 1.61 mg/dL — ABNORMAL HIGH (ref 0.61–1.24)
GFR, Estimated: 44 mL/min — ABNORMAL LOW (ref >60)
Glucose, Bld: 88 mg/dL (ref 70–99)
Potassium: 4.1 mmol/L (ref 3.5–5.1)
Sodium: 140 mmol/L (ref 135–145)

## 2023-06-23 LAB — D-DIMER, QUANTITATIVE: D-Dimer, Quant: 0.33 ug{FEU}/mL (ref 0.00–0.50)

## 2023-06-24 ENCOUNTER — Other Ambulatory Visit: Payer: Self-pay | Admitting: Student

## 2023-06-24 ENCOUNTER — Other Ambulatory Visit (HOSPITAL_COMMUNITY)
Admission: RE | Admit: 2023-06-24 | Discharge: 2023-06-24 | Disposition: A | Discharge status: Home / Self Care | Source: Ambulatory Visit | Attending: Internal Medicine | Admitting: Internal Medicine

## 2023-06-24 DIAGNOSIS — I509 Heart failure, unspecified: Secondary | ICD-10-CM | POA: Diagnosis not present

## 2023-06-24 DIAGNOSIS — I130 Hypertensive heart and chronic kidney disease with heart failure and stage 1 through stage 4 chronic kidney disease, or unspecified chronic kidney disease: Secondary | ICD-10-CM | POA: Diagnosis present

## 2023-06-24 DIAGNOSIS — G459 Transient cerebral ischemic attack, unspecified: Secondary | ICD-10-CM | POA: Insufficient documentation

## 2023-06-24 DIAGNOSIS — N1831 Chronic kidney disease, stage 3a: Secondary | ICD-10-CM | POA: Insufficient documentation

## 2023-06-24 LAB — BASIC METABOLIC PANEL
Anion gap: 11 (ref 5–15)
BUN: 17 mg/dL (ref 8–23)
CO2: 27 mmol/L (ref 22–32)
Calcium: 9.2 mg/dL (ref 8.9–10.3)
Chloride: 99 mmol/L (ref 98–111)
Creatinine, Ser: 1.58 mg/dL — ABNORMAL HIGH (ref 0.61–1.24)
GFR, Estimated: 45 mL/min — ABNORMAL LOW (ref >60)
Glucose, Bld: 90 mg/dL (ref 70–99)
Potassium: 3.7 mmol/L (ref 3.5–5.1)
Sodium: 137 mmol/L (ref 135–145)

## 2023-06-24 LAB — HEPATIC FUNCTION PANEL
ALT: 62 U/L — ABNORMAL HIGH (ref 0–44)
AST: 34 U/L (ref 15–41)
Albumin: 4.1 g/dL (ref 3.5–5.0)
Alkaline Phosphatase: 47 U/L (ref 38–126)
Bilirubin, Direct: 0.3 mg/dL — ABNORMAL HIGH (ref 0.0–0.2)
Indirect Bilirubin: 1.3 mg/dL — ABNORMAL HIGH (ref 0.3–0.9)
Total Bilirubin: 1.6 mg/dL — ABNORMAL HIGH (ref 0.0–1.2)
Total Protein: 7.8 g/dL (ref 6.5–8.1)

## 2023-06-24 LAB — CBC WITH DIFFERENTIAL/PLATELET
Abs Immature Granulocytes: 0.01 10*3/uL (ref 0.00–0.07)
Basophils Absolute: 0.1 10*3/uL (ref 0.0–0.1)
Basophils Relative: 1 %
Eosinophils Absolute: 0.2 10*3/uL (ref 0.0–0.5)
Eosinophils Relative: 3 %
HCT: 50.9 % (ref 39.0–52.0)
Hemoglobin: 16.5 g/dL (ref 13.0–17.0)
Immature Granulocytes: 0 %
Lymphocytes Relative: 45 %
Lymphs Abs: 2.5 10*3/uL (ref 0.7–4.0)
MCH: 29.2 pg (ref 26.0–34.0)
MCHC: 32.4 g/dL (ref 30.0–36.0)
MCV: 89.9 fL (ref 80.0–100.0)
Monocytes Absolute: 0.7 10*3/uL (ref 0.1–1.0)
Monocytes Relative: 13 %
Neutro Abs: 2.1 10*3/uL (ref 1.7–7.7)
Neutrophils Relative %: 38 %
Platelets: 166 10*3/uL (ref 150–400)
RBC: 5.66 MIL/uL (ref 4.22–5.81)
RDW: 13.3 % (ref 11.5–15.5)
WBC: 5.6 10*3/uL (ref 4.0–10.5)
nRBC: 0 % (ref 0.0–0.2)

## 2023-06-24 LAB — LIPID PANEL
Cholesterol: 87 mg/dL (ref 0–200)
HDL: 39 mg/dL — ABNORMAL LOW (ref >40)
LDL Cholesterol: 17 mg/dL (ref 0–99)
Total CHOL/HDL Ratio: 2.2 ratio
Triglycerides: 157 mg/dL — ABNORMAL HIGH (ref <150)
VLDL: 31 mg/dL (ref 0–40)

## 2023-06-29 NOTE — Progress Notes (Deleted)
 [facility] [address] [place], [place] [address]   CLINIC:  Medical Oncology/Hematology  PCP:  Benetta Spar, MD [address] [place] [address] [phone]   REASON FOR VISIT:  Follow-up for recurrent PE  PRIOR THERAPY: Eliquis, warfarin  CURRENT THERAPY: Lovenox  INTERVAL HISTORY:   [name] 78 y.o. 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  He was last seen by Rojelio Brenner PA-C on 12/31/2022.    Since last visit, he was hospitalized for overnight on two occasions for TIA (November 2024 and December 2024), and had also presented to ED with symptoms concerning for TIA in October 2024.  Overall, he has been compliant with Lovenox, but he did miss several doses in November 2024 due to not having a family member to administer injections.  *** Otherwise, he is compliant with taking Lovenox twice daily, but continues to note "little knots under his skin" after receiving his injection. *** He remains on Plavix after his TIA. *** He denies any adverse bleeding effects - no epistaxis, melena, hematochezia, hematemesis, or hematuria. *** He has not had any interval DVT or PE. *** He has mild dyspnea on exertion but denies any chest pain, palpitations, leg swelling, or shortness of breath at rest. *** He has 100***% energy and 100***% appetite. He endorses that he is maintaining a stable weight.***  ASSESSMENT & PLAN:  1.  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 - Admitted inpatient (07/01/2022) and found to have TIA and new/recurrent LV thrombus despite therapeutic INR 2.7, therefore referred back to hematology for additional recommendations. - He was transitioned to LOVENOX (07/07/2022) due to apparent failure of both warfarin and Eliquis.  (Patient stopped Lovenox and put himself back on warfarin in May 2024, but was instructed to switch back to Lovenox at his visit on 09/23/2022.) - Coagulopathy workup (09/09/2022): PT gene mutation negative.  Factor V Leiden negative Negative beta-2 glycoprotein and cardiolipin antibodies.  Lupus anticoagulant not detected. 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 - He has had multiple hospitalizations for TIA, most recently in December 2024. - Most recent echo (04/06/2023): LV apical thrombus not as apparent as before, but likely small amount of residual mural thrombus  - Most recent labs (06/24/2023): D-dimer normal (0.33), baseline CKD with creatinine 1.61/GFR 44.  No evidence of anemia (Hgb 16.5) on 06/24/2023.  Previous iron panel in June 2024 was normal. - He denies any major bleeding events.*** - No symptoms of recurrent DVT or PE.  TIA symptoms have resolved.*** - PLAN: Due to evident failure of both Eliquis and warfarin, discussed with patient that he needs to continue indefinite anticoagulation with LOVENOX injections (1 mg/kg twice daily). - Most recent echocardiogram shows persistent small left apical thrombus, therefore we will continue full dose anticoagulation.  However, if any future echocardiograms shows resolution of LV  thrombus, he can be changed to once daily dosing (1.5 mg/kg daily).   - RTC in 6 months with CBC/D, BMP, D-dimer for ongoing risk/benefit evaluation.     PLAN SUMMARY:*** >> Labs in 6 months = CBC/D, BMP, D-dimer >> OFFICE visit in 6 months     REVIEW OF SYSTEMS:***  Review of Systems  Constitutional:  Negative for appetite change, chills, diaphoresis, fatigue (Baseline), fever and unexpected weight change.  HENT:   Negative for lump/mass and nosebleeds.   Eyes:  Negative for eye problems.  Respiratory:  Positive for shortness of breath (mild, with exertion). Negative for cough and hemoptysis.   Cardiovascular:  Negative for chest pain, leg swelling and palpitations.  Gastrointestinal:  Negative for abdominal pain, blood in stool, constipation, diarrhea, nausea and vomiting.  Genitourinary:  Negative for hematuria.   Skin: Negative.   Neurological:  Negative for dizziness, headaches and light-headedness.  Hematological:  Does not bruise/bleed easily.  Psychiatric/Behavioral:  Negative for sleep disturbance.      PHYSICAL EXAM:  ECOG PERFORMANCE STATUS: 1 - Symptomatic but completely ambulatory *** There were no vitals filed for this visit.  There were no vitals filed for this visit.  Physical Exam Constitutional:      Appearance: Normal appearance. He is obese.  Cardiovascular:     Heart sounds: Normal heart sounds.  Pulmonary:     Breath sounds: Normal breath sounds.  Neurological:     General: No focal deficit present.     Mental Status: Mental status is at baseline.  Psychiatric:        Behavior: Behavior normal. Behavior is cooperative.    PAST MEDICAL/SURGICAL HISTORY:  Past Medical History:  Diagnosis Date   Apical mural thrombus    Arteriosclerotic cardiovascular disease (ASCVD)    a. s/p CABG in 2011 b. DES to RCA in 08/2017 c. cath in 06/2018 showing patent LIMA-LAD, SVG-OM1-OM2 and SVG-D1   Chronic anticoagulation    Chronic systolic CHF (congestive heart failure) (HCC)    a. EF 45-50% in 06/2012 b. 35-40% in 08/2017 with similar results in 06/2018 c. EF at 30-35% in 10/2020   CVA (cerebral vascular  accident) (HCC)    HTN (hypertension)    Hyperlipidemia    Keloid    median sternotomy   Pulmonary embolism (HCC) 03/2010   Substance abuse (HCC)    formerly cocaine   Past Surgical History:  Procedure Laterality Date   COLONOSCOPY N/A 01/22/2017   Procedure: COLONOSCOPY;  Surgeon: West Bali, MD;  Location: [facility];  Service: Endoscopy;  Laterality: N/A;  200   CORONARY ARTERY BYPASS GRAFT  03/18/2010   LIMA-LAD, SVG to diagonal, OM1 & OM2   CORONARY STENT INTERVENTION N/A 08/21/2017   Procedure: CORONARY STENT INTERVENTION;  Surgeon: Tonny Bollman, MD;  Location: [facility] INVASIVE CV LAB;  Service: Cardiovascular;  Laterality: N/A;   LEFT HEART CATH AND CORS/GRAFTS ANGIOGRAPHY N/A 08/21/2017   Procedure: LEFT HEART CATH AND CORS/GRAFTS ANGIOGRAPHY;  Surgeon: Tonny Bollman, MD;  Location: [facility] INVASIVE CV LAB;  Service: Cardiovascular;  Laterality: N/A;   LEFT HEART CATH AND CORS/GRAFTS ANGIOGRAPHY N/A 06/21/2018   Procedure: LEFT HEART CATH AND CORS/GRAFTS ANGIOGRAPHY;  Surgeon: Yvonne Kendall, MD;  Location: [facility] INVASIVE CV LAB;  Service: Cardiovascular;  Laterality: N/A;   POLYPECTOMY  01/22/2017   Procedure: POLYPECTOMY;  Surgeon: West Bali, MD;  Location: [facility];  Service: Endoscopy;;  Transverse(CS) and sigmoid colon(HS)   PTCA  06/1996   LAD & RCA  TEE WITHOUT CARDIOVERSION N/A 06/10/2012   Procedure: TRANSESOPHAGEAL ECHOCARDIOGRAM (TEE);  Surgeon: Wendall Stade, MD;  Location: [facility];  Service: Cardiovascular;  Laterality: N/A;    SOCIAL HISTORY:  Social History   Socioeconomic History   Marital status: Widowed    Spouse name: Not on file   Number of children: Not on file   Years of education: Not on file   Highest education level: Not on file  Occupational History   Occupation: disability  Tobacco Use   Smoking status: Former    Current packs/day: 0.00    Types: Cigarettes    Quit date: 2004    Years since quitting: 21.2   Smokeless  tobacco: Never  Vaping Use   Vaping status: Never Used  Substance and Sexual Activity   Alcohol use: Not Currently    Comment: rare   Drug use: No    Types: Cocaine    Comment: Former cocaine abuse - last use for 51th birthday   Sexual activity: Not Currently  Other Topics Concern   Not on file  Social History Narrative   Not on file   Social Drivers of Health   Financial Resource Strain: Low Risk  (10/30/2020)   Overall Financial Resource Strain (CARDIA)    Difficulty of Paying Living Expenses: Not very hard  Food Insecurity: No Food Insecurity (04/06/2023)   Hunger Vital Sign    Worried About Running Out of Food in the Last Year: Never true    Ran Out of Food in the Last Year: Never true  Transportation Needs: No Transportation Needs (04/06/2023)   PRAPARE - Administrator, Civil Service (Medical): No    Lack of Transportation (Non-Medical): No  Physical Activity: Insufficiently Active (10/30/2020)   Exercise Vital Sign    Days of Exercise per Week: 2 days    Minutes of Exercise per Session: 20 min  Stress: No Stress Concern Present (10/30/2020)   [employer] of Occupational Health - Occupational Stress Questionnaire    Feeling of Stress : Only a little  Social Connections: Moderately Isolated (10/30/2020)   Social Connection and Isolation Panel [NHANES]    Frequency of Communication with Friends and Family: Three times a week    Frequency of Social Gatherings with Friends and Family: Twice a week    Attends Religious Services: 1 to 4 times per year    Active Member of [employer] or Organizations: No    Attends Banker Meetings: Never    Marital Status: Widowed  Intimate Partner Violence: Not At Risk (04/06/2023)   Humiliation, Afraid, Rape, and Kick questionnaire    Fear of Current or Ex-Partner: No    Emotionally Abused: No    Physically Abused: No    Sexually Abused: No    FAMILY HISTORY:  Family History  Problem Relation Age of Onset    Cancer Mother 50   Stroke Daughter     CURRENT MEDICATIONS:  Outpatient Encounter Medications as of 06/30/2023  Medication Sig   atorvastatin (LIPITOR) 40 MG tablet Take 40 mg by mouth daily.   Cal Carb-Mag Hydrox-Simeth (ROLAIDS ADVANCED) 1000-200-40 MG CHEW Chew 1-2 tablets by mouth daily as needed (heartburn).   clopidogrel (PLAVIX) 75 MG tablet Take 75 mg by mouth daily.   empagliflozin (JARDIANCE) 10 MG TABS tablet TAKE 1 TABLET BY MOUTH EVERY DAY BEFORE BREAKFAST (Patient taking differently: Take 10 mg by mouth daily.)   enoxaparin (LOVENOX) 100 MG/ML injection Inject 0.9 mLs (90 mg total)  into the skin every 12 (twelve) hours.   furosemide (LASIX) 20 MG tablet TAKE 3 TABLETS BY MOUTH EVERY DAY (Patient taking differently: Take 40 mg by mouth daily.)   hydrALAZINE (APRESOLINE) 50 MG tablet Take 50 mg by mouth 3 (three) times daily.   isosorbide mononitrate (IMDUR) 60 MG 24 hr tablet Take 1 tablet (60 mg total) by mouth daily. (Patient not taking: Reported on 04/06/2023)   metoprolol succinate (TOPROL-XL) 25 MG 24 hr tablet Take 1 tablet (25 mg total) by mouth daily.   nitroGLYCERIN (NITROSTAT) 0.4 MG SL tablet Place 1 tablet (0.4 mg total) under the tongue every 5 (five) minutes as needed for chest pain. up to 3 doses.   spironolactone (ALDACTONE) 25 MG tablet TAKE 1 TABLET (25 MG TOTAL) BY MOUTH DAILY.   No facility-administered encounter medications on file as of 06/30/2023.    ALLERGIES:  Allergies  Allergen Reactions   Lisinopril Swelling and Other (See Comments)    Mouth and tongue swells    LABORATORY DATA:  I have reviewed the labs as listed.  CBC    Component Value Date/Time   WBC 5.6 06/24/2023 1013   RBC 5.66 06/24/2023 1013   HGB 16.5 06/24/2023 1013   HCT 50.9 06/24/2023 1013   PLT 166 06/24/2023 1013   MCV 89.9 06/24/2023 1013   MCH 29.2 06/24/2023 1013   MCHC 32.4 06/24/2023 1013   RDW 13.3 06/24/2023 1013   LYMPHSABS 2.5 06/24/2023 1013   MONOABS 0.7  06/24/2023 1013   EOSABS 0.2 06/24/2023 1013   BASOSABS 0.1 06/24/2023 1013      Latest Ref Rng & Units 06/24/2023   10:13 AM 06/23/2023   12:50 PM 04/14/2023   11:27 AM  CMP  Glucose 70 - 99 mg/dL 90  88  93   BUN 8 - 23 mg/dL 17  18  15    Creatinine 0.61 - 1.24 mg/dL 0.45  4.09  8.11   Sodium 135 - 145 mmol/L 137  140  136   Potassium 3.5 - 5.1 mmol/L 3.7  4.1  3.9   Chloride 98 - 111 mmol/L 99  101  102   CO2 22 - 32 mmol/L 27  29  28    Calcium 8.9 - 10.3 mg/dL 9.2  9.4  8.9   Total Protein 6.5 - 8.1 g/dL 7.8     Total Bilirubin 0.0 - 1.2 mg/dL 1.6     Alkaline Phos 38 - 126 U/L 47     AST 15 - 41 U/L 34     ALT 0 - 44 U/L 62       DIAGNOSTIC IMAGING:  I have independently reviewed the relevant imaging and discussed with the patient.   WRAP UP:  All questions were answered. The patient knows to call the clinic with any problems, questions or concerns.  Medical decision making: Low***  Time spent on visit: I spent 15 minutes counseling the patient face to face. The total time spent in the appointment was 22 minutes and more than 50% was on counseling.  Carnella Guadalajara, PA-C  ***

## 2023-06-30 ENCOUNTER — Inpatient Hospital Stay: Payer: 59 | Admitting: Physician Assistant

## 2023-06-30 NOTE — Progress Notes (Unsigned)
 [facility] [address], [place] [address]   CLINIC:  Medical Oncology/Hematology  PCP:  Benetta Spar, MD [address] [place] [address] [phone]   REASON FOR VISIT:  Follow-up for recurrent PE  PRIOR THERAPY: Eliquis, warfarin  CURRENT THERAPY: Lovenox  INTERVAL HISTORY:   [name] 78 y.o. 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  He was last seen by Rojelio Brenner PA-C on 12/31/2022.    Since last visit, he was hospitalized for overnight on two occasions for TIA (November 2024 and December 2024), and had also presented to ED with symptoms concerning for TIA in October 2024.  Overall, he has been compliant with Lovenox, but he did miss several doses in November 2024 due to not having a family member to administer injections.  Otherwise, he is compliant with taking Lovenox twice daily, but continues to note "little knots under his skin" after receiving his injection.   He remains on Plavix after his TIA.  He denies any adverse bleeding effects - no epistaxis, melena, hematochezia, hematemesis, or hematuria.  He has not had any interval DVT or PE.  He has mild dyspnea on exertion but denies any chest pain, palpitations, leg swelling, or shortness of breath at rest.  He has 70% energy and 100% appetite. He endorses that he is maintaining a stable weight.  ASSESSMENT & PLAN:  1.  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 - Admitted inpatient (07/01/2022) and found to have TIA and new/recurrent LV thrombus despite therapeutic INR 2.7, therefore referred back to hematology for additional recommendations. - He was transitioned to LOVENOX (07/07/2022) due to apparent failure of both warfarin and Eliquis.  (Patient stopped Lovenox and put himself back on warfarin in May 2024, but was instructed to switch back to Lovenox at his visit on 09/23/2022.) - Coagulopathy workup (09/09/2022): PT gene mutation negative.  Factor V Leiden negative Negative beta-2 glycoprotein and cardiolipin antibodies.  Lupus anticoagulant not detected. 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 - He has had multiple hospitalizations for TIA, most recently in December 2024. - Most recent echo (04/06/2023): LV apical thrombus not as apparent as before, but likely small amount of residual mural thrombus  - Most recent labs (06/24/2023): D-dimer normal (0.33), baseline CKD with creatinine 1.61/GFR 44.  No evidence of anemia (Hgb 16.5) on 06/24/2023.  Previous iron panel in June 2024 was normal. - He denies any major bleeding events. - No symptoms of recurrent DVT or PE.  TIA symptoms have resolved. - PLAN: Due to evident failure of both Eliquis and warfarin, discussed with patient that he needs to continue indefinite anticoagulation with LOVENOX injections (1 mg/kg twice daily). - Most recent echocardiogram shows persistent small left apical thrombus, therefore we will continue full dose anticoagulation.  However, if any future echocardiograms shows resolution of LV  thrombus, he can be changed to once daily dosing (1.5 mg/kg daily).   - RTC in 6 months with CBC/D, BMP, D-dimer for ongoing risk/benefit evaluation.    PLAN SUMMARY: >> Labs in 6 months =  CBC/D, BMP, D-dimer >> OFFICE visit in 6 months     REVIEW OF SYSTEMS:  Review of Systems  Constitutional:  Negative for appetite change, chills, diaphoresis, fatigue, fever and unexpected weight change.  HENT:   Negative for lump/mass and nosebleeds.   Eyes:  Negative for eye problems.  Respiratory:  Positive for shortness of breath (mild, with exertion). Negative for cough and hemoptysis.   Cardiovascular:  Negative for chest pain, leg swelling and palpitations.  Gastrointestinal:  Negative for abdominal pain, blood in stool, constipation, diarrhea, nausea and vomiting.  Genitourinary:  Negative for hematuria.   Musculoskeletal:  Positive for arthralgias.  Skin: Negative.   Neurological:  Negative for dizziness, headaches and light-headedness.  Hematological:  Does not bruise/bleed easily.  Psychiatric/Behavioral:  Positive for sleep disturbance.      PHYSICAL EXAM:  ECOG PERFORMANCE STATUS: 1 - Symptomatic but completely ambulatory  Vitals:   07/01/23 0808  BP: 101/62  Pulse: 66  Resp: 19  Temp: 98 F (36.7 C)  SpO2: 96%    Filed Weights   07/01/23 0808  Weight: 207 lb 10.8 oz (94.2 kg)    Physical Exam Constitutional:      Appearance: Normal appearance. He is obese.  Cardiovascular:     Heart sounds: Normal heart sounds.  Pulmonary:     Breath sounds: Normal breath sounds.  Neurological:     General: No focal deficit present.     Mental Status: Mental status is at baseline.  Psychiatric:        Behavior: Behavior normal. Behavior is cooperative.    PAST MEDICAL/SURGICAL HISTORY:  Past Medical History:  Diagnosis Date   Apical mural thrombus    Arteriosclerotic cardiovascular disease (ASCVD)    a. s/p CABG in 2011 b. DES to RCA in 08/2017 c. cath in 06/2018 showing patent LIMA-LAD, SVG-OM1-OM2 and SVG-D1   Chronic anticoagulation    Chronic systolic CHF (congestive heart failure) (HCC)    a. EF 45-50% in 06/2012 b. 35-40% in 08/2017 with similar results  in 06/2018 c. EF at 30-35% in 10/2020   CVA (cerebral vascular accident) (HCC)    HTN (hypertension)    Hyperlipidemia    Keloid    median sternotomy   Pulmonary embolism (HCC) 03/2010   Substance abuse (HCC)    formerly cocaine   Past Surgical History:  Procedure Laterality Date   COLONOSCOPY N/A 01/22/2017   Procedure: COLONOSCOPY;  Surgeon: West Bali, MD;  [facility]: [facility];  Service: Endoscopy;  Laterality: N/A;  200   CORONARY ARTERY BYPASS GRAFT  03/18/2010   LIMA-LAD, SVG to diagonal, OM1 & OM2   CORONARY STENT INTERVENTION N/A 08/21/2017   Procedure: CORONARY STENT INTERVENTION;  Surgeon: Tonny Bollman, MD;  Location: [facility] INVASIVE CV LAB;  Service: Cardiovascular;  Laterality: N/A;   LEFT HEART CATH AND CORS/GRAFTS ANGIOGRAPHY N/A 08/21/2017   Procedure: LEFT HEART CATH AND CORS/GRAFTS ANGIOGRAPHY;  Surgeon: Tonny Bollman, MD;  Location: [facility] INVASIVE CV LAB;  Service: Cardiovascular;  Laterality: N/A;   LEFT HEART CATH AND CORS/GRAFTS ANGIOGRAPHY N/A 06/21/2018   Procedure: LEFT HEART CATH AND CORS/GRAFTS ANGIOGRAPHY;  Surgeon: Yvonne Kendall, MD;  Location: [facility] INVASIVE CV LAB;  Service: Cardiovascular;  Laterality: N/A;   POLYPECTOMY  01/22/2017   Procedure:  POLYPECTOMY;  Surgeon: West Bali, MD;  [facility]: [facility];  Service: Endoscopy;;  Transverse(CS) and sigmoid colon(HS)   PTCA  06/1996   LAD & RCA   TEE WITHOUT CARDIOVERSION N/A 06/10/2012   Procedure: TRANSESOPHAGEAL ECHOCARDIOGRAM (TEE);  Surgeon: Wendall Stade, MD;  Location: [facility];  Service: Cardiovascular;  Laterality: N/A;    SOCIAL HISTORY:  Social History   Socioeconomic History   Marital status: Widowed    Spouse name: Not on file   Number of children: Not on file   Years of education: Not on file   Highest education level: Not on file  Occupational History   Occupation: disability  Tobacco Use   Smoking status: Former    Current packs/day: 0.00    Types: Cigarettes     Quit date: 2004    Years since quitting: 21.2   Smokeless tobacco: Never  Vaping Use   Vaping status: Never Used  Substance and Sexual Activity   Alcohol use: Not Currently    Comment: rare   Drug use: No    Types: Cocaine    Comment: Former cocaine abuse - last use for 51th birthday   Sexual activity: Not Currently  Other Topics Concern   Not on file  Social History Narrative   Not on file   Social Drivers of Health   Financial Resource Strain: Low Risk  (10/30/2020)   Overall Financial Resource Strain (CARDIA)    Difficulty of Paying Living Expenses: Not very hard  Food Insecurity: No Food Insecurity (04/06/2023)   Hunger Vital Sign    Worried About Running Out of Food in the Last Year: Never true    Ran Out of Food in the Last Year: Never true  Transportation Needs: No Transportation Needs (04/06/2023)   PRAPARE - Administrator, Civil Service (Medical): No    Lack of Transportation (Non-Medical): No  Physical Activity: Insufficiently Active (10/30/2020)   Exercise Vital Sign    Days of Exercise per Week: 2 days    Minutes of Exercise per Session: 20 min  Stress: No Stress Concern Present (10/30/2020)   [employer] of Occupational Health - Occupational Stress Questionnaire    Feeling of Stress : Only a little  Social Connections: Moderately Isolated (10/30/2020)   Social Connection and Isolation Panel [NHANES]    Frequency of Communication with Friends and Family: Three times a week    Frequency of Social Gatherings with Friends and Family: Twice a week    Attends Religious Services: 1 to 4 times per year    Active Member of [employer] or Organizations: No    Attends Banker Meetings: Never    Marital Status: Widowed  Intimate Partner Violence: Not At Risk (04/06/2023)   Humiliation, Afraid, Rape, and Kick questionnaire    Fear of Current or Ex-Partner: No    Emotionally Abused: No    Physically Abused: No    Sexually Abused: No    FAMILY  HISTORY:  Family History  Problem Relation Age of Onset   Cancer Mother 37   Stroke Daughter     CURRENT MEDICATIONS:  Outpatient Encounter Medications as of 07/01/2023  Medication Sig   atorvastatin (LIPITOR) 40 MG tablet Take 40 mg by mouth daily.   Cal Carb-Mag Hydrox-Simeth (ROLAIDS ADVANCED) 1000-200-40 MG CHEW Chew 1-2 tablets by mouth daily as needed (heartburn).   clopidogrel (PLAVIX) 75 MG tablet Take 75 mg by mouth daily.   cyclobenzaprine (FLEXERIL) 5 MG tablet  Take 5 mg by mouth 2 (two) times daily as needed.   empagliflozin (JARDIANCE) 10 MG TABS tablet TAKE 1 TABLET BY MOUTH EVERY DAY BEFORE BREAKFAST (Patient taking differently: Take 10 mg by mouth daily.)   enoxaparin (LOVENOX) 100 MG/ML injection Inject 0.9 mLs (90 mg total) into the skin every 12 (twelve) hours.   furosemide (LASIX) 20 MG tablet TAKE 3 TABLETS BY MOUTH EVERY DAY (Patient taking differently: Take 40 mg by mouth daily.)   hydrALAZINE (APRESOLINE) 25 MG tablet Take 25 mg by mouth 3 (three) times daily.   metoprolol succinate (TOPROL-XL) 50 MG 24 hr tablet Take 50 mg by mouth daily.   nitroGLYCERIN (NITROSTAT) 0.4 MG SL tablet Place 1 tablet (0.4 mg total) under the tongue every 5 (five) minutes as needed for chest pain. up to 3 doses.   spironolactone (ALDACTONE) 25 MG tablet TAKE 1 TABLET (25 MG TOTAL) BY MOUTH DAILY.   [DISCONTINUED] hydrALAZINE (APRESOLINE) 50 MG tablet Take 50 mg by mouth 3 (three) times daily.   [DISCONTINUED] metoprolol succinate (TOPROL-XL) 25 MG 24 hr tablet Take 1 tablet (25 mg total) by mouth daily.   isosorbide mononitrate (IMDUR) 60 MG 24 hr tablet Take 1 tablet (60 mg total) by mouth daily. (Patient not taking: Reported on 04/06/2023)   No facility-administered encounter medications on file as of 07/01/2023.    ALLERGIES:  Allergies  Allergen Reactions   Lisinopril Swelling and Other (See Comments)    Mouth and tongue swells    LABORATORY DATA:  I have reviewed the labs  as listed.  CBC    Component Value Date/Time   WBC 5.6 06/24/2023 1013   RBC 5.66 06/24/2023 1013   HGB 16.5 06/24/2023 1013   HCT 50.9 06/24/2023 1013   PLT 166 06/24/2023 1013   MCV 89.9 06/24/2023 1013   MCH 29.2 06/24/2023 1013   MCHC 32.4 06/24/2023 1013   RDW 13.3 06/24/2023 1013   LYMPHSABS 2.5 06/24/2023 1013   MONOABS 0.7 06/24/2023 1013   EOSABS 0.2 06/24/2023 1013   BASOSABS 0.1 06/24/2023 1013      Latest Ref Rng & Units 06/24/2023   10:13 AM 06/23/2023   12:50 PM 04/14/2023   11:27 AM  CMP  Glucose 70 - 99 mg/dL 90  88  93   BUN 8 - 23 mg/dL 17  18  15    Creatinine 0.61 - 1.24 mg/dL 4.09  8.11  9.14   Sodium 135 - 145 mmol/L 137  140  136   Potassium 3.5 - 5.1 mmol/L 3.7  4.1  3.9   Chloride 98 - 111 mmol/L 99  101  102   CO2 22 - 32 mmol/L 27  29  28    Calcium 8.9 - 10.3 mg/dL 9.2  9.4  8.9   Total Protein 6.5 - 8.1 g/dL 7.8     Total Bilirubin 0.0 - 1.2 mg/dL 1.6     Alkaline Phos 38 - 126 U/L 47     AST 15 - 41 U/L 34     ALT 0 - 44 U/L 62       DIAGNOSTIC IMAGING:  I have independently reviewed the relevant imaging and discussed with the patient.   WRAP UP:  All questions were answered. The patient knows to call the clinic with any problems, questions or concerns.  Medical decision making: Low  Time spent on visit: I spent 15 minutes counseling the patient face to face. The total time spent in the appointment was 22  minutes and more than 50% was on counseling.  Carnella Guadalajara, PA-C  07/01/23 8:33 AM

## 2023-07-01 ENCOUNTER — Inpatient Hospital Stay (HOSPITAL_BASED_OUTPATIENT_CLINIC_OR_DEPARTMENT_OTHER): Admitting: Physician Assistant

## 2023-07-01 VITALS — BP 101/62 | HR 66 | Temp 98.0°F | Resp 19 | Wt 207.7 lb

## 2023-07-01 DIAGNOSIS — Z7901 Long term (current) use of anticoagulants: Secondary | ICD-10-CM

## 2023-07-01 DIAGNOSIS — I513 Intracardiac thrombosis, not elsewhere classified: Secondary | ICD-10-CM | POA: Diagnosis not present

## 2023-07-01 DIAGNOSIS — I2699 Other pulmonary embolism without acute cor pulmonale: Secondary | ICD-10-CM | POA: Diagnosis not present

## 2023-07-01 NOTE — Patient Instructions (Signed)
[facility] **VISIT SUMMARY & IMPORTANT INSTRUCTIONS **   You were seen today by Rojelio Brenner PA-C for your blood clots.    BLOOD CLOTS You have had multiple blood clots (blood clots in your heart and blood clots in your lungs). You had blood clots even though you were on Eliquis (in the past). During your most recent hospital stay, it was discovered that you had a new blood clot in your heart even though you had been taking Coumadin (warfarin). Therefore, it is very important that we switch you to a new type of blood thinner.  You will need to start taking a blood thinner called LOVENOX (ENOXAPARIN INJECTIONS). You will take these injections twice a day (once in the morning once in the evening) for the rest of your life.  LABS: Return in 6 months for repeat labs  FOLLOW-UP APPOINTMENT: Office visit in 6 months  ** Thank you for trusting me with your healthcare!  I strive to provide all of my patients with quality care at each visit.  If you receive a survey for this visit, I would be so grateful to you for taking the time to provide feedback.  Thank you in advance!  ~ Indi Willhite                   Dr. Doreatha Massed   &   Rojelio Brenner, PA-C   - - - - - - - - - - - - - - - - - -    Thank you for choosing  [facility] Center at [facility] to provide your oncology and hematology care.  To afford each patient quality time with our provider, please arrive at least 15 minutes before your scheduled appointment time.   If you have a lab appointment with the Cancer Center please come in thru the Main Entrance and check in at the main information desk.  You need to re-schedule your appointment should you arrive 10 or more minutes late.  We strive to give you quality time with our providers, and arriving late affects you and other patients whose appointments are after yours.  Also, if you no show three or more times for appointments  you may be dismissed from the clinic at the providers discretion.     Again, thank you for choosing [facility].  Our hope is that these requests will decrease the amount of time that you wait before being seen by our physicians.       _____________________________________________________________  Should you have questions after your visit to [facility], please contact our office at [phone] and follow the prompts.  Our office hours are 8:00 a.m. and 4:30 p.m. Monday - Friday.  Please note that voicemails left after 4:00 p.m. may not be returned until the following business day.  We are closed weekends and major holidays.  You do have access to a nurse 24-7, just call the main number to the clinic [phone] and do not press any options, hold on the line and a nurse will answer the phone.    For prescription refill requests, have your pharmacy contact our office and allow 72 hours.

## 2023-07-03 ENCOUNTER — Ambulatory Visit: Admitting: Orthopedic Surgery

## 2023-07-03 VITALS — BP 131/65 | HR 65

## 2023-07-03 DIAGNOSIS — M19012 Primary osteoarthritis, left shoulder: Secondary | ICD-10-CM | POA: Diagnosis not present

## 2023-07-03 DIAGNOSIS — M75102 Unspecified rotator cuff tear or rupture of left shoulder, not specified as traumatic: Secondary | ICD-10-CM | POA: Diagnosis not present

## 2023-07-03 DIAGNOSIS — M25512 Pain in left shoulder: Secondary | ICD-10-CM

## 2023-07-03 DIAGNOSIS — M19019 Primary osteoarthritis, unspecified shoulder: Secondary | ICD-10-CM | POA: Insufficient documentation

## 2023-07-03 NOTE — Progress Notes (Signed)
 Arthritis left shoulder- Referred by Dr. Felecia Shelling

## 2023-07-03 NOTE — Patient Instructions (Signed)
 Tylenol arthritis 500 mg every 6 hours as needed   You have received an injection of steroids into the joint. 15% of patients will have increased pain within the 24 hours postinjection.   This is transient and will go away.   We recommend that you use ice packs on the injection site for 20 minutes every 2 hours and extra strength Tylenol 2 tablets every 8 as needed until the pain resolves.  If you continue to have pain after taking the Tylenol and using the ice please call the office for further instructions.

## 2023-07-03 NOTE — Progress Notes (Signed)
 Patient ID: [name], male   DOB: 1946-03-19, 78 y.o.   MRN: [id number]  SUMMARY AND PLAN:  Encounter Diagnoses  Name Primary?   Glenohumeral arthritis Yes   Acute pain of left shoulder    Rotator cuff syndrome of left shoulder    78 year old male with arthritis of the shoulder with components of bursitis and rotator cuff tendinitis who was on Plavix.  We did contact Dr. Letitia Neri office he says that he only gave the patient Tylenol to take.  The patient thinks that he got 2 different pills the second 1 worked better.  In any event we did a subacromial decompression.  We showed him a model of his shoulder explained the components of his disease process  He will follow-up with [place] as needed  Procedure note the subacromial injection shoulder left   Verbal consent was obtained to inject the  Left   Shoulder  Timeout was completed to confirm the injection site is a subacromial space of the  left  shoulder  Medication used Depo-Medrol 40 mg and lidocaine 1% 3 cc  Anesthesia was provided by ethyl chloride  The injection was performed in the left  posterior subacromial space. After pinning the skin with alcohol and anesthetized the skin with ethyl chloride the subacromial space was injected using a 20-gauge needle. There were no complications  Sterile dressing was applied.      Chief Complaint  Patient presents with   Shoulder Pain    Left shoulder pain      HPI [name] is a 78 y.o. male.  Gradual onset of pain left shoulder.  It actually started in the right shoulder then switched to the left shoulder.  The patient was placed on 2 medications the second 1 worked better.  However he is unaware of what the name of the medication is.  He complains of loss of motion left shoulder pain in the left shoulder and inability to lift things overhead if something is in his hand  He does have some complicating medical issues which include Obstructive sleep  apnea Hypertension Coronary artery disease history of pulmonary embolism Cerebral infarction hyperglycemia stage III kidney disease he had a CABG  Current Outpatient Medications  Medication Instructions   atorvastatin (LIPITOR) 40 mg, Daily   Cal Carb-Mag Hydrox-Simeth (ROLAIDS ADVANCED) 1000-200-40 MG CHEW 1-2 tablets, Daily PRN   clopidogrel (PLAVIX) 75 mg, Daily   cyclobenzaprine (FLEXERIL) 5 mg, 2 times daily PRN   empagliflozin (JARDIANCE) 10 MG TABS tablet TAKE 1 TABLET BY MOUTH EVERY DAY BEFORE BREAKFAST   enoxaparin (LOVENOX) 1 mg/kg, Subcutaneous, Every 12 hours   furosemide (LASIX) 20 MG tablet TAKE 3 TABLETS BY MOUTH EVERY DAY   hydrALAZINE (APRESOLINE) 25 mg, 3 times daily   isosorbide mononitrate (IMDUR) 60 mg, Oral, Daily   metoprolol succinate (TOPROL-XL) 50 mg, Daily   nitroGLYCERIN (NITROSTAT) 0.4 mg, Sublingual, Every 5 min PRN, up to 3 doses.   spironolactone (ALDACTONE) 25 mg, Oral, Daily    Allergies  Allergen Reactions   Lisinopril Swelling and Other (See Comments)    Mouth and tongue swells    Review of Systems Review of Systems  Constitutional:  Negative for fever.  Respiratory:  Negative for shortness of breath.   Cardiovascular:  Negative for chest pain.  Skin: Negative.   Neurological:  Negative for tingling and sensory change.    Past Medical History:  Diagnosis Date   Apical mural thrombus    Arteriosclerotic cardiovascular  disease (ASCVD)    a. s/p CABG in 2011 b. DES to RCA in 08/2017 c. cath in 06/2018 showing patent LIMA-LAD, SVG-OM1-OM2 and SVG-D1   Chronic anticoagulation    Chronic systolic CHF (congestive heart failure) (HCC)    a. EF 45-50% in 06/2012 b. 35-40% in 08/2017 with similar results in 06/2018 c. EF at 30-35% in 10/2020   CVA (cerebral vascular accident) (HCC)    HTN (hypertension)    Hyperlipidemia    Keloid    median sternotomy   Pulmonary embolism (HCC) 03/2010   Substance abuse (HCC)    formerly cocaine    Past  Surgical History:  Procedure Laterality Date   COLONOSCOPY N/A 01/22/2017   Procedure: COLONOSCOPY;  Surgeon: West Bali, MD;  [facility];  Service: Endoscopy;  Laterality: N/A;  200   CORONARY ARTERY BYPASS GRAFT  03/18/2010   LIMA-LAD, SVG to diagonal, OM1 & OM2   CORONARY STENT INTERVENTION N/A 08/21/2017   Procedure: CORONARY STENT INTERVENTION;  Surgeon: Tonny Bollman, MD;  Location: [facility] INVASIVE CV LAB;  Service: Cardiovascular;  Laterality: N/A;   LEFT HEART CATH AND CORS/GRAFTS ANGIOGRAPHY N/A 08/21/2017   Procedure: LEFT HEART CATH AND CORS/GRAFTS ANGIOGRAPHY;  Surgeon: Tonny Bollman, MD;  Location: [facility] INVASIVE CV LAB;  Service: Cardiovascular;  Laterality: N/A;   LEFT HEART CATH AND CORS/GRAFTS ANGIOGRAPHY N/A 06/21/2018   Procedure: LEFT HEART CATH AND CORS/GRAFTS ANGIOGRAPHY;  Surgeon: Yvonne Kendall, MD;  Location: [facility] INVASIVE CV LAB;  Service: Cardiovascular;  Laterality: N/A;   POLYPECTOMY  01/22/2017   Procedure: POLYPECTOMY;  Surgeon: West Bali, MD;  [facility];  Service: Endoscopy;;  Transverse(CS) and sigmoid colon(HS)   PTCA  06/1996   LAD & RCA   TEE WITHOUT CARDIOVERSION N/A 06/10/2012   Procedure: TRANSESOPHAGEAL ECHOCARDIOGRAM (TEE);  Surgeon: Wendall Stade, MD;  [facility]: [facility];  Service: Cardiovascular;  Laterality: N/A;    Family History  Problem Relation Age of Onset   Cancer Mother 49   Stroke Daughter      Social History   Tobacco Use   Smoking status: Former    Current packs/day: 0.00    Types: Cigarettes    Quit date: 2004    Years since quitting: 21.2   Smokeless tobacco: Never  Vaping Use   Vaping status: Never Used  Substance Use Topics   Alcohol use: Not Currently    Comment: rare   Drug use: No    Types: Cocaine    Comment: Former cocaine abuse - last use for 51th birthday    Allergies  Allergen Reactions   Lisinopril Swelling and Other (See Comments)    Mouth and tongue swells     @ALL @  No outpatient medications have been marked as taking for the 07/03/23 encounter (Office Visit) with Vickki Hearing, MD.       Physical Exam BP 131/65   Pulse 65   Ambulatory status normal with no assistive devices  GENERAL : APPEARANCE IS NORMAL GROOMING IS GOOD  NORMAL MOOD AND AFFECT  AWAKE ALERT AND ORIENTED X 3   Left SHOULDER  TENDERNESS anterolateral ROM 90 degrees flexion in the scapular plane 40 degrees external rotation at 0 degrees abduction STABLE ANTERIORLY POSTERIORLY AND INFERIORLY SKIN CLEAN     PROVOCATIVE TESTS  PAINFUL ARC 90-120 EMPTY CAN -JOBST TEST no weakness EXTERNAL ROTATION LAG TEST normal LIFT OFF TEST normal BELLYPRESS TEST normal  ON THE OTHER SIDE THE RIGHT SHOULDER HAS  NORMAL SKIN, NO ROM DEFICITS, EXCELLENT STABILITY, AND NORMAL 5/5 MMT STRENGTH   MEDICAL DECISION MAKING  A.  Encounter Diagnoses  Name Primary?   Glenohumeral arthritis Yes   Acute pain of left shoulder    Rotator cuff syndrome of left shoulder     B. DATA ANALYSED:  Call primary care's office.  They indicate only Tylenol recommended for shoulder pain  IMAGING: Independent interpretation of images: Outside images were reviewed.  Interpretation.  My interpretation is that the patient has degenerative changes in the glenohumeral joint the head is still normal shape there is no proximal migration of the humerus.  There is some arthritis in the [facility] joint.  The glenohumeral joint arthritis is mild  Orders: No new orders  Outside records reviewed: No outside records reviewed  C. MANAGEMENT injection, education. Repeat injection in 3 months if needed    No orders of the defined types were placed in this encounter.

## 2023-07-07 ENCOUNTER — Ambulatory Visit (INDEPENDENT_AMBULATORY_CARE_PROVIDER_SITE_OTHER): Payer: 59 | Admitting: Neurology

## 2023-07-07 ENCOUNTER — Encounter: Payer: Self-pay | Admitting: Neurology

## 2023-07-07 VITALS — BP 136/68 | HR 59 | Ht 66.0 in | Wt 207.0 lb

## 2023-07-07 DIAGNOSIS — G459 Transient cerebral ischemic attack, unspecified: Secondary | ICD-10-CM

## 2023-07-07 DIAGNOSIS — D6859 Other primary thrombophilia: Secondary | ICD-10-CM

## 2023-07-07 NOTE — Progress Notes (Signed)
 [facility] [address] [place]. [place] [address] [phone]       OFFICE FOLLOW-UP VISIT NOTE  [name] Date of Birth:  [birth year] Medical Record Number:  [id number]   Referring MD: Dr. Melynda Ripple  Reason for Referral: Stroke  HPI: Initial visit 09/18/2022 [name] is a 78 year old African-American male seen today for initial office consultation visit for TIA.  History is obtained from the patient and his daughter is accompanying him today and review of electronic medical records and I personally reviewed pertinent available imaging.  [name] has past medical history of hypertension, hyperlipidemia, coronary artery disease, pulmonary embolism.  She presented on 07/01/2022 with symptoms of left-sided numbness involving the face and arm.  She had similar symptoms a few days ago which was transient and has resolved.  Neurological exam was significant only for  right sided peripheral vision defect which was old from a previous stroke.  She had a prior history of cardiac thrombus and pulmonary embolism and was previously on Eliquis but switched to Coumadin in 2022.  Pulm embolism.  INR was 2.7.  She denies any other focal symptoms.  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  MRI of the brain showed no large vessel stenosis or occlusion.  Carotid ultrasound showed bilateral less than 50% ICA stenosis.  2D echo showed ejection fraction of 35 to 40% but there was presence of both 1 x 0.5 cm normal by left ventricular global clot.  LDL cholesterol was 1 mg percent and hemoglobin A1c 5.7.  Urine drug screen was negative.  Hypercoagulable panel labs were sent and are negative.  EEG was normal.  Hematology was consulted and patient was switched to Lovenox injections as she had failed previously Eliquis and now warfarin.  Patient however was unable to tolerate Lovenox injections due to developing  knots in her skin and for the last 1 month has switched back to warfarin.  She is tolerating it well without bruising or bleeding.  She has had no recurrent stroke or TIA symptoms. Update/04/2023 : [name] returns for follow-up after last visit nearly 9 months ago.  Patient was admitted twice in November 2024 as well as December 2024 with transient left face and upper extremity numbness.  MRI scan on both occasions was negative for acute stroke.  Echocardiogram in November 2024 had shown left ventricular mural clot with a EF of 35 to 40%.  [name] was on warfarin and since then [name] has since been placed on Lovenox injections twice daily which his daughter has been giving him regularly.  [name] also remains on Plavix which is tolerating well and does have easy bruising but no bleeding.  [name] is tolerating Lipitor 40 mg well without any side effects.  Last lipid profile on 06/24/2023 showed LDL-cholesterol to be 17 mg percent.  Hemoglobin A1c on 04/06/2023 was 5.6.  [name] has not had any further recurrent episodes since then in the last 3 months.  [name] has no other new complaints. ROS:   14 system review of systems is positive for shoulder pain, decreased vision, decreased hearing , skin knots all other systems negative  PMH:  Past Medical History:  Diagnosis Date   Apical mural thrombus    Arteriosclerotic cardiovascular disease (ASCVD)    a. s/p CABG in 2011 b. DES to RCA in 08/2017 c. cath in 06/2018 showing patent LIMA-LAD, SVG-OM1-OM2 and SVG-D1   Chronic  anticoagulation    Chronic systolic CHF (congestive heart failure) (HCC)    a. EF 45-50% in 06/2012 b. 35-40% in 08/2017 with similar results in 06/2018 c. EF at 30-35% in 10/2020   CVA (cerebral vascular accident) (HCC)    HTN (hypertension)    Hyperlipidemia    Keloid    median sternotomy   Pulmonary embolism (HCC) 03/2010   Substance abuse (HCC)    formerly cocaine    Social History:  Social History   Socioeconomic History   Marital status: Widowed     Spouse name: Not on file   Number of children: Not on file   Years of education: Not on file   Highest education level: Not on file  Occupational History   Occupation: disability  Tobacco Use   Smoking status: Former    Current packs/day: 0.00    Types: Cigarettes    Quit date: 2004    Years since quitting: 21.2   Smokeless tobacco: Never  Vaping Use   Vaping status: Never Used  Substance and Sexual Activity   Alcohol use: Not Currently    Comment: rare   Drug use: No    Types: Cocaine    Comment: Former cocaine abuse - last use for 51th birthday   Sexual activity: Not Currently  Other Topics Concern   Not on file  Social History Narrative   Not on file   Social Drivers of Health   Financial Resource Strain: Low Risk  (10/30/2020)   Overall Financial Resource Strain (CARDIA)    Difficulty of Paying Living Expenses: Not very hard  Food Insecurity: No Food Insecurity (04/06/2023)   Hunger Vital Sign    Worried About Running Out of Food in the Last Year: Never true    Ran Out of Food in the Last Year: Never true  Transportation Needs: No Transportation Needs (04/06/2023)   PRAPARE - Administrator, Civil Service (Medical): No    Lack of Transportation (Non-Medical): No  Physical Activity: Insufficiently Active (10/30/2020)   Exercise Vital Sign    Days of Exercise per Week: 2 days    Minutes of Exercise per Session: 20 min  Stress: No Stress Concern Present (10/30/2020)   [employer] of Occupational Health - Occupational Stress Questionnaire    Feeling of Stress : Only a little  Social Connections: Moderately Isolated (10/30/2020)   Social Connection and Isolation Panel [NHANES]    Frequency of Communication with Friends and Family: Three times a week    Frequency of Social Gatherings with Friends and Family: Twice a week    Attends Religious Services: 1 to 4 times per year    Active Member of [employer] or Organizations: No    Attends Banker  Meetings: Never    Marital Status: Widowed  Intimate Partner Violence: Not At Risk (04/06/2023)   Humiliation, Afraid, Rape, and Kick questionnaire    Fear of Current or Ex-Partner: No    Emotionally Abused: No    Physically Abused: No    Sexually Abused: No    Medications:   Current Outpatient Medications on File Prior to Visit  Medication Sig Dispense Refill   atorvastatin (LIPITOR) 40 MG tablet Take 40 mg by mouth daily.     Cal Carb-Mag Hydrox-Simeth (ROLAIDS ADVANCED) 1000-200-40 MG CHEW Chew 1-2 tablets by mouth daily as needed (heartburn).     clopidogrel (PLAVIX) 75 MG tablet Take 75 mg by mouth daily.     cyclobenzaprine (FLEXERIL) 5 MG  tablet Take 5 mg by mouth 2 (two) times daily as needed.     empagliflozin (JARDIANCE) 10 MG TABS tablet TAKE 1 TABLET BY MOUTH EVERY DAY BEFORE BREAKFAST (Patient taking differently: Take 10 mg by mouth daily.) 90 tablet 1   enoxaparin (LOVENOX) 100 MG/ML injection Inject 0.9 mLs (90 mg total) into the skin every 12 (twelve) hours. 55 mL 0   furosemide (LASIX) 20 MG tablet TAKE 3 TABLETS BY MOUTH EVERY DAY (Patient taking differently: Take 40 mg by mouth daily.) 270 tablet 3   hydrALAZINE (APRESOLINE) 25 MG tablet Take 25 mg by mouth 3 (three) times daily.     metoprolol succinate (TOPROL-XL) 50 MG 24 hr tablet Take 50 mg by mouth daily.     nitroGLYCERIN (NITROSTAT) 0.4 MG SL tablet Place 1 tablet (0.4 mg total) under the tongue every 5 (five) minutes as needed for chest pain. up to 3 doses. 25 tablet 4   spironolactone (ALDACTONE) 25 MG tablet TAKE 1 TABLET (25 MG TOTAL) BY MOUTH DAILY. 90 tablet 2   isosorbide mononitrate (IMDUR) 60 MG 24 hr tablet Take 1 tablet (60 mg total) by mouth daily. (Patient not taking: Reported on 04/06/2023) 90 tablet 3   No current facility-administered medications on file prior to visit.    Allergies:   Allergies  Allergen Reactions   Lisinopril Swelling and Other (See Comments)    Mouth and tongue swells     Physical Exam General: well developed, well nourished, elderly African-American male seated, in no evident distress Head: head normocephalic and atraumatic.   Neck: supple with no carotid or supraclavicular bruits Cardiovascular: regular rate and rhythm, no murmurs Musculoskeletal: no deformity.  Right shoulder elevation limited due to pain Skin:  no rash/petichiae Vascular:  Normal pulses all extremities  Neurologic Exam Mental Status: Awake and fully alert. Oriented to place and time. Recent and remote memory intact. Attention span, concentration and fund of knowledge appropriate. Mood and affect appropriate.  Cranial Nerves: Fundoscopic exam not done. Pupils equal, briskly reactive to light. Extraocular movements full without nystagmus. Visual fields show right homonymous hemianopsia to confrontation. Hearing diminished bilaterally. Facial sensation intact. Face, tongue, palate moves normally and symmetrically.  Motor: Normal bulk and tone. Normal strength in all tested extremity muscles.  Diminished fine finger movements on the right.  Orbits left or right upper extremity. Sensory.: intact to touch , pinprick , position and vibratory sensation.  Coordination: Rapid alternating movements normal in all extremities. Finger-to-nose and heel-to-shin performed accurately bilaterally. Gait and Station: Arises from chair without difficulty. Stance is normal. Gait demonstrates normal stride length and balance . Able to heel, toe and tandem walk without difficulty.  Reflexes: 1+ and symmetric. Toes downgoing.   NIHSS  2 Modified Rankin  2   ASSESSMENT: 78 year old African-American male with primary hypercoagulable disorder with recurrent pulmonary embolism, cardiac mural thrombi and silent strokes with episodes of TIA in March, November and December 2024 who has failed eliquis and warfarin and is presently on Lovenox injections.     PLAN:I had a long discussion with the patient and his  daughter regarding his primary hypercoagulable disorder with history of pulmonary embolism, recurrent cardiac thrombus and silent cerebral strokes as well as recent recurrent TIAs and answered questions.  Continue Lovenox injections twice daily for his hypercoagulable state.  Maintain aggressive risk factor modification with strict control of lipids with LDL cholesterol goal below 70 mg percent, hemoglobin A1c goal below 6.5% and hypertension with blood pressure goal below 130/90.  Check screening carotid ultrasound study. I also advised the patient to eat a healthy diet with plenty of whole grains, cereals, fruits and vegetables, exercise regularly and maintain ideal body weight return for follow-up in the future in 1 year or call earlier if necessary. Greater than 50% time during this 35-minute   visit was spent on counseling and coordination of care about his TIA and silent strokes and hypercoagulability and answering questions  Delia Heady, MD Note: This document was prepared with digital dictation and possible smart phrase technology. Any transcriptional errors that result from this process are unintentional.

## 2023-07-07 NOTE — Patient Instructions (Addendum)
 I had a long discussion with the patient and his daughter regarding his primary hypercoagulable disorder with history of pulmonary embolism, recurrent cardiac thrombus and silent cerebral strokes as well as recent recurrent TIAs and answered questions.  Continue Lovenox injections twice daily for his hypercoagulable state.  Maintain aggressive risk factor modification with strict control of lipids with LDL cholesterol goal below 70 mg percent, hemoglobin A1c goal below 6.5% and hypertension with blood pressure goal below 130/90.  Check screening carotid ultrasound study. I also advised the patient to eat a healthy diet with plenty of whole grains, cereals, fruits and vegetables, exercise regularly and maintain ideal body weight return for follow-up in the future in 1 year or call earlier if necessary.

## 2023-07-22 ENCOUNTER — Ambulatory Visit (HOSPITAL_COMMUNITY)
Admission: RE | Admit: 2023-07-22 | Discharge: 2023-07-22 | Disposition: A | Discharge status: Home / Self Care | Source: Ambulatory Visit | Attending: Neurology | Admitting: Neurology

## 2023-07-22 DIAGNOSIS — G459 Transient cerebral ischemic attack, unspecified: Secondary | ICD-10-CM | POA: Diagnosis present

## 2023-07-26 NOTE — Progress Notes (Signed)
 Kindly inform pt that carotid dopplers were fine.  No blockages to worry about

## 2023-07-28 ENCOUNTER — Telehealth: Payer: Self-pay | Admitting: Neurology

## 2023-07-28 NOTE — Telephone Encounter (Signed)
 Called the patient and reviewed the carotid Ultrasound results. Pt verbalized understanding. Pt had no questions at this time but was encouraged to call back if questions arise.

## 2023-07-28 NOTE — Telephone Encounter (Signed)
-----   Message from Ardella Beaver sent at 07/26/2023  2:11 PM EDT ----- Kindly inform pt that carotid dopplers were fine.  No blockages to worry about

## 2023-09-25 ENCOUNTER — Ambulatory Visit: Payer: 59 | Admitting: Internal Medicine

## 2023-09-26 NOTE — Progress Notes (Signed)
 Cardiology Office Note   Date:  10/01/2023   ID:  [name], [name] [birth year], MRN [id number]  PCP:  Carlette Benita Area, MD  Cardiologist:   Vina Gull, MD    Pt presents for f/u of CAD      History of Present Illness: [name] is a 78 y.o. 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   May 2019 he had intervention to native RCA.       In 2020 he presented with NSTEMI   Had been off of anticoagulation.   Cath showed severe native CAD with occluded prox LAD and LCx   The RCA was patent, stents also patent.   The LIMA to LAD; SVG to OM1, SVG to Diag were all patent.   Given presentation there was a question of possible coronary embolism     Treated with heparin     Echo at the time showed LVEF 35 to 40% with akinesis of inferior, septal, apical walls   July 2022 the pt was admitted for PE  Echo in July showed LVEF 30 to 35% with akinesis of the apex, distal infeiror, anteroseptal walls     PT on Eliquis    Switched to coumadin   May 2023  Echo (limited)   LVEF was 35 to 40%  RVEF was normal  Nov 2024  PT admitted with L facial numbness   CT and MRI negative for acute findings  Echo showed LV thrombus LVEF 35 to 40%  He had been on lovenox  at home after failing coumadin    His daughte was out of town and so he missed some shots  I saw the pt in clinic in Dec 2024  The pt says since seen he has been doing fair  Breathing about the same  OK  Occasionally gets winded    He denies CP   Seen by NP in IM   Question if he can come off of lovenox      Pt reports knots in abdomen  Allergies:   Lisinopril   Past Medical History:  Diagnosis Date   Apical mural thrombus    Arteriosclerotic cardiovascular disease (ASCVD)    a. s/p CABG in 2011 b. DES to RCA in 08/2017 c. cath in 06/2018 showing patent LIMA-LAD, SVG-OM1-OM2 and SVG-D1   Chronic anticoagulation    Chronic systolic CHF (congestive heart failure) (HCC)     a. EF 45-50% in 06/2012 b. 35-40% in 08/2017 with similar results in 06/2018 c. EF at 30-35% in 10/2020   CVA (cerebral vascular accident) (HCC)    HTN (hypertension)    Hyperlipidemia    Keloid    median sternotomy   Pulmonary embolism (HCC) 03/2010   Substance abuse (HCC)    formerly cocaine    Past Surgical History:  Procedure Laterality Date   COLONOSCOPY N/A 01/22/2017   Procedure: COLONOSCOPY;  Surgeon: Harvey Margo LITTIE, MD;  Location: [facility];  Service: Endoscopy;  Laterality: N/A;  200   CORONARY ARTERY BYPASS GRAFT  03/18/2010   LIMA-LAD, SVG to diagonal, OM1 & OM2   CORONARY STENT INTERVENTION N/A 08/21/2017   Procedure: CORONARY STENT INTERVENTION;  Surgeon: Wonda Sharper, MD;  Location: [facility] INVASIVE CV LAB;  Service: Cardiovascular;  Laterality: N/A;   LEFT HEART CATH AND CORS/GRAFTS ANGIOGRAPHY N/A 08/21/2017   Procedure: LEFT HEART CATH AND CORS/GRAFTS ANGIOGRAPHY;  Surgeon: Wonda Sharper,  MD;  Location: [facility] INVASIVE CV LAB;  Service: Cardiovascular;  Laterality: N/A;   LEFT HEART CATH AND CORS/GRAFTS ANGIOGRAPHY N/A 06/21/2018   Procedure: LEFT HEART CATH AND CORS/GRAFTS ANGIOGRAPHY;  Surgeon: Mady Bruckner, MD;  Location: [facility] INVASIVE CV LAB;  Service: Cardiovascular;  Laterality: N/A;   POLYPECTOMY  01/22/2017   Procedure: POLYPECTOMY;  Surgeon: Harvey Margo CROME, MD;  Location: [facility];  Service: Endoscopy;;  Transverse(CS) and sigmoid colon(HS)   PTCA  06/1996   LAD & RCA   TEE WITHOUT CARDIOVERSION N/A 06/10/2012   Procedure: TRANSESOPHAGEAL ECHOCARDIOGRAM (TEE);  Surgeon: Maude JAYSON Emmer, MD;  Location: [facility];  Service: Cardiovascular;  Laterality: N/A;     Social History:  The patient  reports that he quit smoking about 21 years ago. His smoking use included cigarettes. He has never used smokeless tobacco. He reports that he does not currently use alcohol . He reports that he does not use drugs.   Family History:  The patient's family history  includes Cancer (age of onset: 37) in his mother; Stroke in his daughter.    ROS:  Please see the history of present illness. All other systems are reviewed and  Negative to the above problem except as noted.    PHYSICAL EXAM: VS:  BP 122/78 (BP Location: Left Arm, Cuff Size: Normal)   Pulse 62   Ht 5' 6 (1.676 m)   Wt 210 lb (95.3 kg)   SpO2 98%   BMI 33.89 kg/m   GEN: Obese 78 yo in no acute distress  HEENT: normal  Neck: no JVD  No bruits Cardiac: RRR; no murmurs.   No LE edema  Respiratory:  clear to auscultation GI: soft, nontender No hepatomegaly  Indurated areas subcutaneous along anterior abd (injection sites)  No evid of infection       EKG:  EKG not done today   Echo  November 2024  1. Left ventricular apical thrombus is present. Left apex appears to be  anuerysmal. Left ventricular ejection fraction, by estimation, is 35 to  40%. The left ventricle has moderately decreased function. The left  ventricle demonstrates regional wall  motion abnormalities (see scoring diagram/findings for description). Left  ventricular diastolic parameters are consistent with Grade I diastolic  dysfunction (impaired relaxation).   2. Right ventricular systolic function is normal. The right ventricular  size is normal.   3. Left atrial size was mildly dilated.   4. The mitral valve is normal in structure. No evidence of mitral valve  regurgitation. No evidence of mitral stenosis.   5. The aortic valve is normal in structure. Aortic valve regurgitation is  not visualized. No aortic stenosis is present.   6. The inferior vena cava is normal in size with greater than 50%  respiratory variability, suggesting right atrial pressure of 3 mmHg.   Echo   May 2023  1. Limited study.   2. Left ventricular ejection fraction, by estimation, is 35 to 40%. The  left ventricle has moderately decreased function. The left ventricle  demonstrates regional wall motion abnormalities (see scoring   diagram/findings for description).   3. Definity  contrast shows slow flow at LV apex but no formed thrombus.   4. Right ventricular systolic function is normal. The right ventricular  size is normal.   5. The inferior vena cava is normal in size with greater than 50%  respiratory variability, suggesting right atrial pressure of 3 mmHg.   Echo  July 2022 1. Left ventricular ejection  fraction, by estimation, is 30 to 35%. The left ventricle has normal function. Left ventricular endocardial border not optimally defined to evaluate regional wall motion. Left ventricular diastolic parameters were normal. The entire apex is akinetic, there is some stasis of blood flow in the apex without clear thrombus. The distal inferior, anteroseptal walls are akinetic. The inferior wall is hypokinetic. 2. Right ventricular systolic function was not well visualized. The right ventricular size is not well visualized. There is moderately elevated pulmonary artery systolic pressure. 3. The mitral valve is normal in structure. No evidence of mitral valve regurgitation. No evidence of mitral stenosis. 4. The aortic valve is tricuspid. Aortic valve regurgitation is not visualized. No aortic stenosis is present. 5. The inferior vena cava is normal in size with greater than 50% respiratory variabil  Cath; The following studies were reviewed today: Cath March 2020- Conclusions: Severe native coronary artery disease, including occluded proximal LAD and LCx.  RCA with patent mid/distal stents and mild in-stent restenosis. Widely patent LIMA-LAD, SVG-OM1-OM, and SVG-D. Mildly elevated left ventricular filling pressure.   Recommendations: Medical therapy; question coronary embolism with spontaneous lysis.  Plan to restart heparin  infusion 2 hours after TR band removal and resumption of apixaban  tomorrow AM if no evidence of bleeding. Continue clopidogrel  75 mg daily.  Defer continuing aspirin  in the setting of  long-term anticoagulation and clopidogrel  use. Aggressive secondary prevention.    Lipid Panel    Component Value Date/Time   CHOL 87 06/24/2023 1013   TRIG 157 (H) 06/24/2023 1013   HDL 39 (L) 06/24/2023 1013   CHOLHDL 2.2 06/24/2023 1013   VLDL 31 06/24/2023 1013   LDLCALC 17 06/24/2023 1013      Wt Readings from Last 3 Encounters:  10/01/23 210 lb (95.3 kg)  07/07/23 207 lb (93.9 kg)  07/01/23 207 lb 10.8 oz (94.2 kg)      ASSESSMENT AND PLAN:  1  CAD   Last LHC  in 2020   Pt with severe native dz  Patent grafts     Pt currently without angina   FOllow   2  HFrEF   Repeat echo to reassess LVEF and thrombus (use Definity )  Volume status ok   Continue Hydralazine , Imdur , Toprol  XL, Aldactone , Jardiance .     Pt had angioedema with ACE I / ARB    3  Hx PE  Continue lovenox    He has failed oral agents     4  Hx of mural LV thrombus  Repeat echo      5 Neuro  PT failed coumadin  and Eliquis    On Lovenox    Follows in neuro    I do not think there is an alternative for this    Alos on Plavix       6  HTN   BP is controlled     7  HL  On lipitor   LDL 17    HDL 39  Trig 157          Current medicines are reviewed at length with the patient today.  The patient does not have concerns regarding medicines.  Signed, Vina Gull, MD  10/01/2023 9:34 AM    [facility] [address] [place], [place], [place]  [address] Phone: [phone]; Fax: [phone]

## 2023-10-01 ENCOUNTER — Ambulatory Visit: Attending: Internal Medicine | Admitting: Internal Medicine

## 2023-10-01 ENCOUNTER — Encounter: Payer: Self-pay | Admitting: Internal Medicine

## 2023-10-01 VITALS — BP 122/78 | HR 62 | Ht 66.0 in | Wt 210.0 lb

## 2023-10-01 DIAGNOSIS — I519 Heart disease, unspecified: Secondary | ICD-10-CM

## 2023-10-01 NOTE — Patient Instructions (Signed)
 Medication Instructions:  Your physician recommends that you continue on your current medications as directed. Please refer to the Current Medication list given to you today.   Labwork: none  Testing/Procedures: Your physician has requested that you have an echocardiogram. Echocardiography is a painless test that uses sound waves to create images of your heart. It provides your doctor with information about the size and shape of your heart and how well your heart's chambers and valves are working. This procedure takes approximately one hour. There are no restrictions for this procedure. Please do NOT wear cologne, perfume, aftershave, or lotions (deodorant is allowed). Please arrive 15 minutes prior to your appointment time.  Please note: We ask at that you not bring children with you during ultrasound (echo/ vascular) testing. Due to room size and safety concerns, children are not allowed in the ultrasound rooms during exams. Our front office staff cannot provide observation of children in our lobby area while testing is being conducted. An adult accompanying a patient to their appointment will only be allowed in the ultrasound room at the discretion of the ultrasound technician under special circumstances. We apologize for any inconvenience.   Follow-Up: January 2026 Dr Okey  Any Other Special Instructions Will Be Listed Below (If Applicable).  If you need a refill on your cardiac medications before your next appointment, please call your pharmacy.

## 2023-10-02 ENCOUNTER — Ambulatory Visit (HOSPITAL_COMMUNITY)
Admission: RE | Admit: 2023-10-02 | Discharge: 2023-10-02 | Disposition: A | Discharge status: Home / Self Care | Source: Ambulatory Visit | Attending: Internal Medicine | Admitting: Internal Medicine

## 2023-10-02 DIAGNOSIS — I519 Heart disease, unspecified: Secondary | ICD-10-CM | POA: Insufficient documentation

## 2023-10-02 DIAGNOSIS — I501 Left ventricular failure: Secondary | ICD-10-CM

## 2023-10-02 LAB — ECHOCARDIOGRAM COMPLETE
AR max vel: 2.57 cm2
AV Area VTI: 3.37 cm2
AV Area mean vel: 2.74 cm2
AV Mean grad: 2 mmHg
AV Peak grad: 3.4 mmHg
Ao pk vel: 0.92 m/s
Area-P 1/2: 3.34 cm2
S' Lateral: 4 cm

## 2023-10-02 MED ORDER — PERFLUTREN LIPID MICROSPHERE
1.0000 mL | INTRAVENOUS | Status: AC | PRN
  Administered 2023-10-02: 2 mL via INTRAVENOUS

## 2023-10-05 ENCOUNTER — Encounter (HOSPITAL_COMMUNITY): Payer: Self-pay

## 2023-10-05 ENCOUNTER — Emergency Department (HOSPITAL_COMMUNITY)

## 2023-10-05 ENCOUNTER — Other Ambulatory Visit: Payer: Self-pay

## 2023-10-05 ENCOUNTER — Ambulatory Visit: Payer: Self-pay | Admitting: Internal Medicine

## 2023-10-05 ENCOUNTER — Emergency Department (HOSPITAL_COMMUNITY): Admission: EM | Admit: 2023-10-05 | Discharge: 2023-10-05 | Disposition: A | Discharge status: Home / Self Care

## 2023-10-05 DIAGNOSIS — M25511 Pain in right shoulder: Secondary | ICD-10-CM | POA: Diagnosis present

## 2023-10-05 DIAGNOSIS — M19011 Primary osteoarthritis, right shoulder: Secondary | ICD-10-CM | POA: Diagnosis not present

## 2023-10-05 MED ORDER — METHOCARBAMOL 500 MG PO TABS: 500.0000 mg | ORAL_TABLET | Freq: Three times a day (TID) | ORAL | 0 refills | Status: AC | PRN

## 2023-10-05 NOTE — ED Notes (Signed)
 Pt given ice pack with DC paperwork.

## 2023-10-05 NOTE — ED Triage Notes (Signed)
 Pt arrived via POV from home c/o swelling to his right neck/shoulder area and reports his right ear has been bothering him. Pt reports symptoms began today.

## 2023-10-05 NOTE — Discharge Instructions (Addendum)
 You can use Robaxin  as needed for spasms and pain.  Continue medications as prescribed.  Follow-up with your primary care doctor.

## 2023-10-05 NOTE — ED Provider Notes (Signed)
 [place] EMERGENCY DEPARTMENT AT [facility] Provider Note   CSN: [id number] Arrival date & time: 10/05/23  1655     Patient presents with: Neck Pain   [name] is a 78 y.o. male.   78 year old male presents for evaluation of right neck and shoulder pain.  States he usually has pain on the left side but today it seems to be more on the right.  He describes it as a soreness in the spine.  States it radiates up to his neck.  States he has had some increased difficulty moving his shoulder today due to the pain.  He denies any ear pain, sore throat, numbness or tingling, difficulty with grip or weakness, denies any other symptoms or concerns at this time.   Neck Pain Associated symptoms: no chest pain and no fever        Prior to Admission medications   Medication Sig Start Date End Date Taking? Authorizing Provider  methocarbamol  (ROBAXIN ) 500 MG tablet Take 1 tablet (500 mg total) by mouth every 8 (eight) hours as needed for up to 5 days for muscle spasms. 10/05/23 10/10/23 Yes Samar Venneman L, DO  atorvastatin  (LIPITOR) 40 MG tablet Take 40 mg by mouth daily. 03/29/23   [provider]  Cal Carb-Mag Hydrox-Simeth (ROLAIDS ADVANCED) 1000-200-40 MG CHEW Chew 1-2 tablets by mouth daily as needed (heartburn).    [provider]  clopidogrel  (PLAVIX ) 75 MG tablet Take 75 mg by mouth daily.    [provider]  cyclobenzaprine  (FLEXERIL ) 5 MG tablet Take 5 mg by mouth 2 (two) times daily as needed. 06/24/23   [provider]  empagliflozin  (JARDIANCE ) 10 MG TABS tablet TAKE 1 TABLET BY MOUTH EVERY DAY BEFORE BREAKFAST 12/23/22   Okey Vina GAILS, MD  enoxaparin  (LOVENOX ) 100 MG/ML injection Inject 0.9 mLs (90 mg total) into the skin every 12 (twelve) hours. 03/01/23   Swayze, Ava, DO  furosemide  (LASIX ) 20 MG tablet TAKE 3 TABLETS BY MOUTH EVERY DAY 10/02/20   Okey Vina GAILS, MD  hydrALAZINE  (APRESOLINE ) 25 MG tablet Take 25 mg by mouth 3  (three) times daily. 06/24/23   [provider]  isosorbide  mononitrate (IMDUR ) 60 MG 24 hr tablet Take 1 tablet (60 mg total) by mouth daily. 09/12/20 10/01/23  Okey Vina GAILS, MD  metoprolol  succinate (TOPROL -XL) 50 MG 24 hr tablet Take 50 mg by mouth daily. 05/28/23   [provider]  nitroGLYCERIN  (NITROSTAT ) 0.4 MG SL tablet Place 1 tablet (0.4 mg total) under the tongue every 5 (five) minutes as needed for chest pain. up to 3 doses. 07/05/13   Jerilynn Lamarr HERO, NP  spironolactone  (ALDACTONE ) 25 MG tablet TAKE 1 TABLET (25 MG TOTAL) BY MOUTH DAILY. 06/24/23 06/18/24  Johnson Laymon HERO, PA-C    Allergies: Lisinopril    Review of Systems  Constitutional:  Negative for chills and fever.  HENT:  Negative for ear pain and sore throat.   Eyes:  Negative for pain and visual disturbance.  Respiratory:  Negative for cough and shortness of breath.   Cardiovascular:  Negative for chest pain and palpitations.  Gastrointestinal:  Negative for abdominal pain and vomiting.  Genitourinary:  Negative for dysuria and hematuria.  Musculoskeletal:  Positive for neck pain. Negative for arthralgias and back pain.       Admits right sided shoulder and neck pain   Skin:  Negative for color change and rash.  Neurological:  Negative for seizures and syncope.  All  other systems reviewed and are negative.   Updated Vital Signs BP (!) 150/85 (BP Location: Right Arm)   Pulse 85   Temp 98 F (36.7 C) (Oral)   Resp 17   Ht 5' 6 (1.676 m)   Wt 95.3 kg   SpO2 99%   BMI 33.89 kg/m   Physical Exam Vitals and nursing note reviewed.  Constitutional:      General: He is not in acute distress.    Appearance: Normal appearance. He is well-developed. He is not ill-appearing.  HENT:     Head: Normocephalic and atraumatic.     Right Ear: Tympanic membrane, ear canal and external ear normal. There is no impacted cerumen.     Mouth/Throat:     Mouth: Mucous membranes are moist.     Pharynx:  Oropharynx is clear. No oropharyngeal exudate or posterior oropharyngeal erythema.   Eyes:     Conjunctiva/sclera: Conjunctivae normal.    Cardiovascular:     Rate and Rhythm: Normal rate and regular rhythm.     Heart sounds: Normal heart sounds. No murmur heard. Pulmonary:     Effort: Pulmonary effort is normal. No respiratory distress.     Breath sounds: Normal breath sounds. No stridor. No wheezing or rhonchi.  Abdominal:     Palpations: Abdomen is soft.     Tenderness: There is no abdominal tenderness.   Musculoskeletal:        General: No swelling.     Cervical back: Neck supple.     Comments: There is no erythema or swelling to neck or shoulder, mild ttp over the ac joint, full rom of arm, MS is 5/5 throughout   Skin:    General: Skin is warm and dry.     Capillary Refill: Capillary refill takes less than 2 seconds.   Neurological:     Mental Status: He is alert.   Psychiatric:        Mood and Affect: Mood normal.     (all labs ordered are listed, but only abnormal results are displayed) Labs Reviewed - No data to display  EKG: None  Radiology: DG Shoulder Right Result Date: 10/05/2023 CLINICAL DATA:  Shoulder pain.  Patient reports swelling. EXAM: RIGHT SHOULDER - 2+ VIEW COMPARISON:  11/29/2022 FINDINGS: There is no evidence of fracture or dislocation. Acromioclavicular and glenohumeral degenerative spurring. No erosive change. Soft tissues are unremarkable. IMPRESSION: Mild acromioclavicular and glenohumeral degenerative spurring. No acute findings. Electronically Signed   By: Andrea Gasman M.D.   On: 10/05/2023 18:41     Procedures   Medications Ordered in the ED - No data to display                                  Medical Decision Making Medical Decision Making Nursing notes are reviewed. Differential diagnosis for this patient would include but not limited to: Sprain, strain, spasm, degenerative change, other  Records reviewed: Outpatient  records reviewed and patient seen in cardiology office for heart failure  Studies: Right shoulder x-ray interpreted independently radiology and patient with bone spurs and degenerative changes of the [place] joint and glenohumeral   Emergency Department Course:  Vital signs and pulse oximetry are reviewed, evaluated by myself and found to be within normal limits prior to final disposition. Findings of laboratory testing and medical imaging are discussed with patient and family that is available. Patient agrees with the medical care plan as  follows:  Patient's imaging as above.  Will start on Robaxin  as needed advised Tylenol  as needed for pain.  He is on other medication for his arthritis scheduled to keep taking this.  Advise close follow-up with his primary care doctor otherwise return to the ER for new or worsening symptoms.  He feels comfortable plan be discharged home.  All results and plan discussed with patient and wife at bedside.  Problems Addressed: Arthritis of right shoulder: acute illness or injury  Amount and/or Complexity of Data Reviewed Independent Historian: spouse    Details: Helped to provide history regarding the patient's shoulder and neck pain and history of TIA in the past  External Data Reviewed: notes. Radiology: ordered and independent interpretation performed. Decision-making details documented in ED Course.  Risk OTC drugs. Prescription drug management.    Final diagnoses:  Arthritis of right shoulder    ED Discharge Orders          Ordered    methocarbamol  (ROBAXIN ) 500 MG tablet  Every 8 hours PRN        10/05/23 1858               Payden Bonus L, DO 10/05/23 2159

## 2023-10-05 NOTE — ED Notes (Signed)
 ED Provider at bedside.

## 2023-10-19 ENCOUNTER — Ambulatory Visit: Admitting: Orthopedic Surgery

## 2023-10-22 ENCOUNTER — Other Ambulatory Visit: Payer: Self-pay | Admitting: Physician Assistant

## 2023-10-22 DIAGNOSIS — I513 Intracardiac thrombosis, not elsewhere classified: Secondary | ICD-10-CM

## 2023-10-22 DIAGNOSIS — I2699 Other pulmonary embolism without acute cor pulmonale: Secondary | ICD-10-CM

## 2023-12-30 ENCOUNTER — Inpatient Hospital Stay: Attending: Physician Assistant

## 2023-12-30 DIAGNOSIS — Z7901 Long term (current) use of anticoagulants: Secondary | ICD-10-CM | POA: Diagnosis not present

## 2023-12-30 DIAGNOSIS — I2699 Other pulmonary embolism without acute cor pulmonale: Secondary | ICD-10-CM

## 2023-12-30 DIAGNOSIS — Z86711 Personal history of pulmonary embolism: Secondary | ICD-10-CM | POA: Insufficient documentation

## 2023-12-30 DIAGNOSIS — I513 Intracardiac thrombosis, not elsewhere classified: Secondary | ICD-10-CM

## 2023-12-30 LAB — BASIC METABOLIC PANEL WITH GFR
Anion gap: 11 (ref 5–15)
BUN: 15 mg/dL (ref 8–23)
CO2: 25 mmol/L (ref 22–32)
Calcium: 9.2 mg/dL (ref 8.9–10.3)
Chloride: 101 mmol/L (ref 98–111)
Creatinine, Ser: 1.57 mg/dL — ABNORMAL HIGH (ref 0.61–1.24)
GFR, Estimated: 45 mL/min — ABNORMAL LOW (ref >60)
Glucose, Bld: 94 mg/dL (ref 70–99)
Potassium: 3.9 mmol/L (ref 3.5–5.1)
Sodium: 137 mmol/L (ref 135–145)

## 2023-12-30 LAB — CBC WITH DIFFERENTIAL/PLATELET
Abs Immature Granulocytes: 0.01 K/uL (ref 0.00–0.07)
Basophils Absolute: 0 K/uL (ref 0.0–0.1)
Basophils Relative: 1 %
Eosinophils Absolute: 0.2 K/uL (ref 0.0–0.5)
Eosinophils Relative: 3 %
HCT: 52 % (ref 39.0–52.0)
Hemoglobin: 16.6 g/dL (ref 13.0–17.0)
Immature Granulocytes: 0 %
Lymphocytes Relative: 52 %
Lymphs Abs: 2.9 K/uL (ref 0.7–4.0)
MCH: 29.4 pg (ref 26.0–34.0)
MCHC: 31.9 g/dL (ref 30.0–36.0)
MCV: 92 fL (ref 80.0–100.0)
Monocytes Absolute: 0.7 K/uL (ref 0.1–1.0)
Monocytes Relative: 13 %
Neutro Abs: 1.7 K/uL (ref 1.7–7.7)
Neutrophils Relative %: 31 %
Platelets: 161 K/uL (ref 150–400)
RBC: 5.65 MIL/uL (ref 4.22–5.81)
RDW: 13.7 % (ref 11.5–15.5)
WBC: 5.5 K/uL (ref 4.0–10.5)
nRBC: 0 % (ref 0.0–0.2)

## 2023-12-30 LAB — D-DIMER, QUANTITATIVE: D-Dimer, Quant: 0.28 ug{FEU}/mL (ref 0.00–0.50)

## 2024-01-05 NOTE — Progress Notes (Unsigned)
 [facility] [address], [place] [address]   CLINIC:  Medical Oncology/Hematology  PCP:  Carlette Benita Area, MD [address] [place] [address] [phone]   REASON FOR VISIT:  Follow-up for recurrent PE  PRIOR THERAPY: Eliquis , warfarin  CURRENT THERAPY: Lovenox   INTERVAL HISTORY:   [name] 78 y.o. 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   He was last seen by Pleasant Barefoot PA-C on 07/01/2023. He has not had any interim hospitalizations since his last visit.  ***  At today's visit, he reports feeling ***. Overall, he has been compliant with Lovenox , but he did miss several doses in November 2024 due to not having a family member to administer injections.*** ***Otherwise, he is compliant with taking Lovenox  twice daily, but continues to note little knots under his skin after receiving his injection.    ***He remains on Plavix  after his TIA. ***He denies any adverse bleeding effects - no epistaxis, melena, hematochezia, hematemesis, or hematuria. ***He has not had any interval DVT or PE. ***He has mild dyspnea on exertion but denies any chest pain, palpitations, leg swelling, or shortness of breath at rest. ****** Falls ***He has 70***% energy and 100***% appetite. ***He endorses that he is maintaining a stable weight.  ASSESSMENT & PLAN:  1.  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 - Admitted inpatient (07/01/2022) and found to have TIA and new/recurrent LV thrombus despite therapeutic INR 2.7, therefore referred back to hematology for additional recommendations. - He was transitioned to LOVENOX  (07/07/2022) due to apparent failure of both warfarin and Eliquis .  (Patient stopped Lovenox  and put himself back on warfarin in May 2024, but was instructed to switch back to Lovenox  at his visit on 09/23/2022.) - Coagulopathy workup (09/09/2022): PT gene mutation negative.  Factor V Leiden negative Negative beta-2  glycoprotein and cardiolipin antibodies.  Lupus anticoagulant not detected. 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 - He has had multiple hospitalizations for TIA, most recently in December 2024. - Most recent echo (10/02/2023) as reviewed by Dr. Okey (cardiology) did not show any obvious clot in the LV apex, but since there is decreased LV function and akinetic LV apex, patient remains at risk for recurrent LV thrombus in this location. - Per discussion with cardiologist (Dr. Okey), recommends to continue on therapeutic dose of Lovenox  (1 mg/kg daily), to remain on this indefinitely - Most recent labs (12/30/2023): D-dimer normal (0.28), baseline CKD stage IIIa/b with creatinine 1.57/GFR 45.  No evidence of anemia (Hgb 16.6)  - He denies any major bleeding events.*** - *** Falls this year *** - No symptoms of recurrent DVT or PE.  TIA symptoms have resolved.*** - PLAN: Due to evident failure of both Eliquis  and warfarin, discussed with patient that he needs to continue indefinite anticoagulation with LOVENOX  injections (1 mg/kg twice daily).  Discussed with cardiology (Dr. Okey), who agrees with continuing therapeutic dose Lovenox  indefinitely. *** Discharge to PCP/cardiology ***  PLAN SUMMARY: ***  Tentative discharge *** >> Labs in 6 months = CBC/D, BMP, D-dimer >> OFFICE visit in 6 months     REVIEW OF SYSTEMS:***  Review of Systems  Constitutional:  Negative for appetite change, chills, diaphoresis, fatigue, fever and unexpected weight change.  HENT:   Negative for lump/mass and nosebleeds.   Eyes:  Negative for eye problems.  Respiratory:  Positive for shortness of breath (mild, with exertion). Negative for cough and hemoptysis.   Cardiovascular:  Negative for chest pain, leg swelling and palpitations.  Gastrointestinal:  Negative for abdominal pain, blood in stool, constipation, diarrhea, nausea and vomiting.  Genitourinary:  Negative for hematuria.   Musculoskeletal:  Positive for arthralgias.  Skin: Negative.   Neurological:  Negative for dizziness, headaches and light-headedness.  Hematological:  Does not bruise/bleed easily.  Psychiatric/Behavioral:  Positive for sleep disturbance.      PHYSICAL EXAM:***  ECOG PERFORMANCE STATUS: 1 - Symptomatic but completely ambulatory  There were no vitals filed for this visit.   There were no vitals filed for this visit.   Physical Exam Constitutional:      Appearance: Normal appearance. He is obese.  Cardiovascular:     Heart sounds: Normal heart sounds.  Pulmonary:     Breath sounds: Normal breath sounds.  Neurological:     General: No focal deficit present.     Mental Status: Mental status is at baseline.  Psychiatric:        Behavior: Behavior normal. Behavior is cooperative.    PAST MEDICAL/SURGICAL HISTORY:  Past Medical History:  Diagnosis Date   Apical mural thrombus    Arteriosclerotic cardiovascular disease (ASCVD)    a. s/p CABG in 2011 b. DES to RCA in 08/2017 c. cath in 06/2018 showing patent LIMA-LAD, SVG-OM1-OM2 and SVG-D1   Chronic anticoagulation    Chronic systolic CHF (congestive heart failure) (HCC)    a. EF 45-50% in 06/2012 b. 35-40% in 08/2017 with similar results in 06/2018 c. EF at 30-35%  in 10/2020   CVA (cerebral vascular accident) (HCC)    HTN (hypertension)    Hyperlipidemia    Keloid    median sternotomy   Pulmonary embolism (HCC) 03/2010   Substance abuse (HCC)    formerly cocaine   Past Surgical History:  Procedure Laterality Date   COLONOSCOPY N/A 01/22/2017   Procedure: COLONOSCOPY;  Surgeon: Harvey Margo CROME, MD;  Location: [facility];  Service: Endoscopy;  Laterality: N/A;  200   CORONARY ARTERY BYPASS GRAFT  03/18/2010   LIMA-LAD, SVG to diagonal, OM1 & OM2   CORONARY STENT INTERVENTION N/A 08/21/2017   Procedure: CORONARY STENT INTERVENTION;  Surgeon: Wonda Sharper, MD;  Location: [facility] INVASIVE CV LAB;  Service: Cardiovascular;  Laterality: N/A;   LEFT HEART CATH AND CORS/GRAFTS ANGIOGRAPHY N/A 08/21/2017   Procedure: LEFT HEART CATH AND CORS/GRAFTS ANGIOGRAPHY;  Surgeon: Wonda Sharper, MD;  Location: [facility] INVASIVE CV LAB;  Service: Cardiovascular;  Laterality: N/A;   LEFT HEART CATH AND CORS/GRAFTS ANGIOGRAPHY N/A 06/21/2018   Procedure: LEFT HEART CATH AND CORS/GRAFTS ANGIOGRAPHY;  Surgeon: Mady Bruckner, MD;  Location: [facility] INVASIVE CV LAB;  Service: Cardiovascular;  Laterality: N/A;   POLYPECTOMY  01/22/2017   Procedure: POLYPECTOMY;  Surgeon: Harvey Margo CROME, MD;  Location: [facility];  Service: Endoscopy;;  Transverse(CS) and sigmoid colon(HS)   PTCA  06/1996   LAD & RCA  TEE WITHOUT CARDIOVERSION N/A 06/10/2012   Procedure: TRANSESOPHAGEAL ECHOCARDIOGRAM (TEE);  Surgeon: Maude JAYSON Emmer, MD;  Location: [facility];  Service: Cardiovascular;  Laterality: N/A;    SOCIAL HISTORY:  Social History   Socioeconomic History   Marital status: Widowed    Spouse name: Not on file   Number of children: Not on file   Years of education: Not on file   Highest education level: Not on file  Occupational History   Occupation: disability  Tobacco Use   Smoking status: Former    Current packs/day: 0.00    Types: Cigarettes    Quit date: 2004    Years  since quitting: 21.7   Smokeless tobacco: Never  Vaping Use   Vaping status: Never Used  Substance and Sexual Activity   Alcohol  use: Not Currently    Comment: rare   Drug use: No    Types: Cocaine    Comment: Former cocaine abuse - last use for 51th birthday   Sexual activity: Not Currently  Other Topics Concern   Not on file  Social History Narrative   Not on file   Social Drivers of Health   Financial Resource Strain: Low Risk  (10/30/2020)   Overall Financial Resource Strain (CARDIA)    Difficulty of Paying Living Expenses: Not very hard  Food Insecurity: No Food Insecurity (04/06/2023)   Hunger Vital Sign    Worried About Running Out of Food in the Last Year: Never true    Ran Out of Food in the Last Year: Never true  Transportation Needs: No Transportation Needs (04/06/2023)   PRAPARE - Administrator, Civil Service (Medical): No    Lack of Transportation (Non-Medical): No  Physical Activity: Insufficiently Active (10/30/2020)   Exercise Vital Sign    Days of Exercise per Week: 2 days    Minutes of Exercise per Session: 20 min  Stress: No Stress Concern Present (10/30/2020)   [employer] of Occupational Health - Occupational Stress Questionnaire    Feeling of Stress : Only a little  Social Connections: Moderately Isolated (10/30/2020)   Social Connection and Isolation Panel    Frequency of Communication with Friends and Family: Three times a week    Frequency of Social Gatherings with Friends and Family: Twice a week    Attends Religious Services: 1 to 4 times per year    Active Member of [employer] or Organizations: No    Attends Banker Meetings: Never    Marital Status: Widowed  Intimate Partner Violence: Not At Risk (04/06/2023)   Humiliation, Afraid, Rape, and Kick questionnaire    Fear of Current or Ex-Partner: No    Emotionally Abused: No    Physically Abused: No    Sexually Abused: No    FAMILY HISTORY:  Family History  Problem  Relation Age of Onset   Cancer Mother 79   Stroke Daughter     CURRENT MEDICATIONS:  Outpatient Encounter Medications as of 01/06/2024  Medication Sig   atorvastatin  (LIPITOR) 40 MG tablet Take 40 mg by mouth daily.   Cal Carb-Mag Hydrox-Simeth (ROLAIDS ADVANCED) 1000-200-40 MG CHEW Chew 1-2 tablets by mouth daily as needed (heartburn).   clopidogrel  (PLAVIX ) 75 MG tablet Take 75 mg by mouth daily.   cyclobenzaprine  (FLEXERIL ) 5 MG tablet Take 5 mg by mouth 2 (two) times daily as needed.   empagliflozin  (JARDIANCE ) 10 MG TABS tablet TAKE 1 TABLET BY MOUTH EVERY DAY BEFORE BREAKFAST   enoxaparin  (LOVENOX ) 100  MG/ML injection INJECT 0.95 MLS (95 MG TOTAL) INTO THE SKIN EVERY 12 (TWELVE) HOURS.   furosemide  (LASIX ) 20 MG tablet TAKE 3 TABLETS BY MOUTH EVERY DAY   hydrALAZINE  (APRESOLINE ) 25 MG tablet Take 25 mg by mouth 3 (three) times daily.   isosorbide  mononitrate (IMDUR ) 60 MG 24 hr tablet Take 1 tablet (60 mg total) by mouth daily.   metoprolol  succinate (TOPROL -XL) 50 MG 24 hr tablet Take 50 mg by mouth daily.   nitroGLYCERIN  (NITROSTAT ) 0.4 MG SL tablet Place 1 tablet (0.4 mg total) under the tongue every 5 (five) minutes as needed for chest pain. up to 3 doses.   spironolactone  (ALDACTONE ) 25 MG tablet TAKE 1 TABLET (25 MG TOTAL) BY MOUTH DAILY.   No facility-administered encounter medications on file as of 01/06/2024.    ALLERGIES:  Allergies  Allergen Reactions   Lisinopril Swelling and Other (See Comments)    Mouth and tongue swells    LABORATORY DATA:  I have reviewed the labs as listed.  CBC    Component Value Date/Time   WBC 5.5 12/30/2023 0846   RBC 5.65 12/30/2023 0846   HGB 16.6 12/30/2023 0846   HCT 52.0 12/30/2023 0846   PLT 161 12/30/2023 0846   MCV 92.0 12/30/2023 0846   MCH 29.4 12/30/2023 0846   MCHC 31.9 12/30/2023 0846   RDW 13.7 12/30/2023 0846   LYMPHSABS 2.9 12/30/2023 0846   MONOABS 0.7 12/30/2023 0846   EOSABS 0.2 12/30/2023 0846   BASOSABS 0.0  12/30/2023 0846      Latest Ref Rng & Units 12/30/2023    8:46 AM 06/24/2023   10:13 AM 06/23/2023   12:50 PM  CMP  Glucose 70 - 99 mg/dL 94  90  88   BUN 8 - 23 mg/dL 15  17  18    Creatinine 0.61 - 1.24 mg/dL 8.42  8.41  8.38   Sodium 135 - 145 mmol/L 137  137  140   Potassium 3.5 - 5.1 mmol/L 3.9  3.7  4.1   Chloride 98 - 111 mmol/L 101  99  101   CO2 22 - 32 mmol/L 25  27  29    Calcium  8.9 - 10.3 mg/dL 9.2  9.2  9.4   Total Protein 6.5 - 8.1 g/dL  7.8    Total Bilirubin 0.0 - 1.2 mg/dL  1.6    Alkaline Phos 38 - 126 U/L  47    AST 15 - 41 U/L  34    ALT 0 - 44 U/L  62      DIAGNOSTIC IMAGING:  I have independently reviewed the relevant imaging and discussed with the patient.   WRAP UP:  All questions were answered. The patient knows to call the clinic with any problems, questions or concerns.  Medical decision making: Low***  Time spent on visit: I spent 15 minutes counseling the patient face to face. The total time spent in the appointment was 22 minutes and more than 50% was on counseling.  Pleasant CHRISTELLA Barefoot, PA-C  ***

## 2024-01-06 ENCOUNTER — Inpatient Hospital Stay: Attending: Physician Assistant | Admitting: Physician Assistant

## 2024-01-06 VITALS — BP 118/69 | HR 67 | Temp 97.7°F | Resp 18 | Ht 66.0 in | Wt 213.0 lb

## 2024-01-06 DIAGNOSIS — Z823 Family history of stroke: Secondary | ICD-10-CM | POA: Insufficient documentation

## 2024-01-06 DIAGNOSIS — I2699 Other pulmonary embolism without acute cor pulmonale: Secondary | ICD-10-CM | POA: Diagnosis not present

## 2024-01-06 DIAGNOSIS — Z87891 Personal history of nicotine dependence: Secondary | ICD-10-CM | POA: Insufficient documentation

## 2024-01-06 DIAGNOSIS — G479 Sleep disorder, unspecified: Secondary | ICD-10-CM | POA: Diagnosis not present

## 2024-01-06 DIAGNOSIS — I2510 Atherosclerotic heart disease of native coronary artery without angina pectoris: Secondary | ICD-10-CM | POA: Diagnosis not present

## 2024-01-06 DIAGNOSIS — N1831 Chronic kidney disease, stage 3a: Secondary | ICD-10-CM | POA: Diagnosis not present

## 2024-01-06 DIAGNOSIS — I513 Intracardiac thrombosis, not elsewhere classified: Secondary | ICD-10-CM | POA: Diagnosis not present

## 2024-01-06 DIAGNOSIS — Z86711 Personal history of pulmonary embolism: Secondary | ICD-10-CM | POA: Insufficient documentation

## 2024-01-06 DIAGNOSIS — E669 Obesity, unspecified: Secondary | ICD-10-CM | POA: Diagnosis not present

## 2024-01-06 DIAGNOSIS — M2550 Pain in unspecified joint: Secondary | ICD-10-CM | POA: Diagnosis not present

## 2024-01-06 DIAGNOSIS — R0602 Shortness of breath: Secondary | ICD-10-CM | POA: Diagnosis not present

## 2024-01-06 DIAGNOSIS — I130 Hypertensive heart and chronic kidney disease with heart failure and stage 1 through stage 4 chronic kidney disease, or unspecified chronic kidney disease: Secondary | ICD-10-CM | POA: Diagnosis not present

## 2024-01-06 DIAGNOSIS — Z888 Allergy status to other drugs, medicaments and biological substances status: Secondary | ICD-10-CM | POA: Insufficient documentation

## 2024-01-06 DIAGNOSIS — E785 Hyperlipidemia, unspecified: Secondary | ICD-10-CM | POA: Insufficient documentation

## 2024-01-06 DIAGNOSIS — Z809 Family history of malignant neoplasm, unspecified: Secondary | ICD-10-CM | POA: Insufficient documentation

## 2024-01-06 DIAGNOSIS — Z7901 Long term (current) use of anticoagulants: Secondary | ICD-10-CM | POA: Insufficient documentation

## 2024-01-06 DIAGNOSIS — I5022 Chronic systolic (congestive) heart failure: Secondary | ICD-10-CM | POA: Insufficient documentation

## 2024-01-06 DIAGNOSIS — Z79899 Other long term (current) drug therapy: Secondary | ICD-10-CM | POA: Diagnosis not present

## 2024-01-06 DIAGNOSIS — Z7902 Long term (current) use of antithrombotics/antiplatelets: Secondary | ICD-10-CM | POA: Insufficient documentation

## 2024-01-06 NOTE — Patient Instructions (Addendum)
 [facility] at [facility] **VISIT SUMMARY & IMPORTANT INSTRUCTIONS **   You were seen today by Pleasant Barefoot PA-C for your blood clots.    BLOOD CLOTS You have had multiple blood clots (blood clots in your heart and blood clots in your lungs). You had blood clots even though you were on Eliquis  and Coumadin  in the past. If you are not on the right type of blood thinner, you are at high risk for having another life-threatening blood clot. Therefore, it is very important that continue taking a blood thinner called LOVENOX  (ENOXAPARIN  INJECTIONS). You will take these injections twice a day (once in the morning once in the evening) for the rest of your life. You do not need any follow-up visits at the [facility], but should continue to follow closely with your primary care doctor and your cardiologist. Ask your primary care doctor and neurologist if you also still need to be on aspirin  and Plavix  from your stroke, or if it is sufficient to remain on Lovenox  alone.   ** Thank you for trusting me with your healthcare!  I strive to provide all of my patients with quality care at each visit.  If you receive a survey for this visit, I would be so grateful to you for taking the time to provide feedback.  Thank you in advance!  ~ Jacen Carlini                                        Dr. Mickiel Davonna Pleasant Barefoot, PA-C   Delon Hope, NP   - - - - - - - - - - - - - - - - - -     Thank you for choosing [facility] at [facility] to provide your oncology and hematology care.  To afford each patient quality time with our provider, please arrive at least 15 minutes before your scheduled appointment time.   If you have a lab appointment with the Cancer Center please come in thru the Main Entrance and check in at the main information desk.  You need to re-schedule your appointment should you arrive 10 or more minutes late.  We strive to give you  quality time with our providers, and arriving late affects you and other patients whose appointments are after yours.  Also, if you no show three or more times for appointments you may be dismissed from the clinic at the providers discretion.     Again, thank you for choosing [facility].  Our hope is that these requests will decrease the amount of time that you wait before being seen by our physicians.       _____________________________________________________________  Should you have questions after your visit to [facility], please contact our office at [phone] and follow the prompts.  Our office hours are 8:00 a.m. and 4:30 p.m. Monday - Friday.  Please note that voicemails left after 4:00 p.m. may not be returned until the following business day.  We are closed weekends and major holidays.  You do have access to a nurse 24-7, just call the main number to the clinic [phone] and do not press any options, hold on the line and a nurse will answer the phone.    For prescription refill requests, have your pharmacy contact our office and  allow 72 hours.

## 2024-01-07 ENCOUNTER — Telehealth: Payer: Self-pay | Admitting: *Deleted

## 2024-01-07 NOTE — Telephone Encounter (Signed)
 Per Pleasant Barefoot, PAC patient can be transitioned to PCP for management of Lovenox , given that he has been stable for an appropriate timeframe.  May return prn.  Reached out to Trixie Jester, NP with Dr. Alexa office, who has agreed to refill Lovenox  going forward.  Will reach out to us  if further management is needed from a hematology standpoint.

## 2024-01-08 ENCOUNTER — Encounter (HOSPITAL_COMMUNITY): Payer: Self-pay

## 2024-01-08 ENCOUNTER — Other Ambulatory Visit: Payer: Self-pay

## 2024-01-08 ENCOUNTER — Emergency Department (HOSPITAL_COMMUNITY)
Admission: EM | Admit: 2024-01-08 | Discharge: 2024-01-08 | Disposition: A | Discharge status: Home / Self Care | Attending: Emergency Medicine | Admitting: Emergency Medicine

## 2024-01-08 ENCOUNTER — Emergency Department (HOSPITAL_COMMUNITY)

## 2024-01-08 DIAGNOSIS — M25572 Pain in left ankle and joints of left foot: Secondary | ICD-10-CM | POA: Diagnosis present

## 2024-01-08 DIAGNOSIS — Z7901 Long term (current) use of anticoagulants: Secondary | ICD-10-CM | POA: Insufficient documentation

## 2024-01-08 MED ORDER — DICLOFENAC SODIUM 1 % EX GEL: 2.0000 g | Freq: Four times a day (QID) | CUTANEOUS | 0 refills | Status: AC

## 2024-01-08 NOTE — ED Triage Notes (Signed)
 Pt arrived by POV with complaints of ankle pain that started yesterday. Patient does have a hx of arthritis. Pt states that there was no injury that could have caused the pain.

## 2024-01-08 NOTE — ED Provider Notes (Signed)
 [place] EMERGENCY DEPARTMENT AT [facility] Provider Note   CSN: [id number] Arrival date & time: 01/08/24  9064     Patient presents with: Ankle Pain   [name] is a 78 y.o. male.   Patient is a 78 year old male who presents to the emergency department the chief complaint of left ankle pain which has been ongoing for the past few days.  He notes that the pain is worse with ambulation.  He denies any pain proximally over the left lower extremity or over the foot.  He denies any recent falls or blunt trauma.  He notes he does have a history of arthritis.  He denies any associated overlying erythema, warmth, edema.  He has had no associated fever or chills.  He has been compliant with his anticoagulation.   Ankle Pain      Prior to Admission medications   Medication Sig Start Date End Date Taking? Authorizing Provider  atorvastatin  (LIPITOR) 40 MG tablet Take 40 mg by mouth daily. 03/29/23   [provider]  Cal Carb-Mag Hydrox-Simeth (ROLAIDS ADVANCED) 1000-200-40 MG CHEW Chew 1-2 tablets by mouth daily as needed (heartburn).    [provider]  clopidogrel  (PLAVIX ) 75 MG tablet Take 75 mg by mouth daily.    [provider]  cyclobenzaprine  (FLEXERIL ) 5 MG tablet Take 5 mg by mouth 2 (two) times daily as needed. 06/24/23   [provider]  empagliflozin  (JARDIANCE ) 10 MG TABS tablet TAKE 1 TABLET BY MOUTH EVERY DAY BEFORE BREAKFAST 12/23/22   Okey Vina GAILS, MD  enoxaparin  (LOVENOX ) 100 MG/ML injection INJECT 0.95 MLS (95 MG TOTAL) INTO THE SKIN EVERY 12 (TWELVE) HOURS. 10/22/23   Lamon Pleasant HERO, PA-C  furosemide  (LASIX ) 20 MG tablet TAKE 3 TABLETS BY MOUTH EVERY DAY 10/02/20   Okey Vina GAILS, MD  hydrALAZINE  (APRESOLINE ) 25 MG tablet Take 25 mg by mouth 3 (three) times daily. 06/24/23   [provider]  isosorbide  mononitrate (IMDUR ) 60 MG 24 hr tablet Take 1 tablet (60 mg total) by mouth daily. 09/12/20 01/06/24  Okey Vina GAILS, MD  metoprolol  succinate (TOPROL -XL) 50 MG 24 hr tablet Take 50 mg by mouth daily. 05/28/23   [provider]  nitroGLYCERIN  (NITROSTAT ) 0.4 MG SL tablet Place 1 tablet (0.4 mg total) under the tongue every 5 (five) minutes as needed for chest pain. up to 3 doses. 07/05/13   Jerilynn Lamarr HERO, NP  spironolactone  (ALDACTONE ) 25 MG tablet TAKE 1 TABLET (25 MG TOTAL) BY MOUTH DAILY. 06/24/23 06/18/24  Johnson Laymon HERO, PA-C    Allergies: Benadryl [diphenhydramine], Lisinopril, and Penicillins    Review of Systems  Musculoskeletal:        Left ankle pain  All other systems reviewed and are negative.   Updated Vital Signs BP 137/65   Pulse 67   Temp 97.6 F (36.4 C) (Oral)   Resp 17   Ht 5' 6 (1.676 m)   Wt 96.6 kg   SpO2 97%   BMI 34.38 kg/m   Physical Exam Vitals and nursing note reviewed.  Constitutional:      General: He is not in acute distress.    Appearance: Normal appearance. He is not ill-appearing.  HENT:     Head: Normocephalic and atraumatic.     Nose: Nose normal.     Mouth/Throat:     Mouth: Mucous membranes are moist.  Eyes:     Extraocular Movements: Extraocular movements intact.     Conjunctiva/sclera:  Conjunctivae normal.     Pupils: Pupils are equal, round, and reactive to light.  Cardiovascular:     Rate and Rhythm: Normal rate and regular rhythm.     Pulses: Normal pulses.  Pulmonary:     Effort: Pulmonary effort is normal. No respiratory distress.  Musculoskeletal:        General: Normal range of motion.     Cervical back: Normal range of motion and neck supple. No rigidity or tenderness.     Comments: Tender to palpation noted over the medial and lateral malleolus of the left ankle, nontender palpation of the left foot, knee, hip, mild edema over the ankle with no overlying erythema or warmth, DP and PT pulses are dopplerable and intact and intact, sensation intact distally, cap refill less than 2 seconds distally, full range of  motion noted throughout, no obvious deformity or bruising, no skin breakdown or ulceration, no lacerations or abrasions  Skin:    General: Skin is warm and dry.  Neurological:     General: No focal deficit present.     Mental Status: He is alert and oriented to person, place, and time. Mental status is at baseline.  Psychiatric:        Mood and Affect: Mood normal.        Behavior: Behavior normal.        Thought Content: Thought content normal.        Judgment: Judgment normal.     (all labs ordered are listed, but only abnormal results are displayed) Labs Reviewed - No data to display  EKG: None  Radiology: No results found.   Procedures   Medications Ordered in the ED - No data to display  Clinical Course as of 01/08/24 1244  Fri Jan 08, 2024  [id number] 78 year old male complaining of left ankle pain atraumatic for few days.  Worse with ambulating.  No fevers or open wounds.  Likely musculoskeletal and treat symptomatically. [MB]    Clinical Course User Index [MB] Towana Ozell BROCKS, MD                                 Medical Decision Making Patient is doing well at this time and is stable for discharge home.  Discussed with patient that venous duplex was negative for DVT and x-ray was overall unremarkable.  Suspect localized inflammatory reaction at this point.  Low suspicion for septic joint at this time.  Vital signs are otherwise stable.  He does have intact peripheral pulses via Doppler and do not suspect acute arterial occlusion.  Will continue symptomatic treatment with Voltaren gel at this time.  Close follow-up PCP was discussed as well as strict turn precautions for any new or worsening symptoms.  Patient voiced understanding and had no additional questions. Patient was fully evaluated by attending physician who is in agreement to plan at this time.   Amount and/or Complexity of Data Reviewed Radiology: ordered.        Final diagnoses:  None    ED Discharge  Orders     None          Daralene Lonni JONETTA DEVONNA 01/08/24 1245    Towana Ozell BROCKS, MD 01/08/24 1718

## 2024-01-08 NOTE — Discharge Instructions (Signed)
 Please follow-up closely with your primary care doctor on an outpatient basis.  Return to emergency department immediately for any new or worsening symptoms.

## 2024-01-14 ENCOUNTER — Other Ambulatory Visit: Payer: Self-pay | Admitting: *Deleted

## 2024-01-14 ENCOUNTER — Telehealth: Payer: Self-pay | Admitting: Physician Assistant

## 2024-01-14 NOTE — Telephone Encounter (Signed)
 Patient aware and verbalized understanding.

## 2024-01-14 NOTE — Telephone Encounter (Signed)
 At patient's visit on 01/06/2024, he was wondering whether or not he should also stay on Plavix  in addition to his Lovenox .  I reached out to his cardiologist (Dr. Vina Gull) as well as his neurologist (Dr. Eather Popp), who are in agreement that he can DISCONTINUE his Plavix  at this time.  They do however continue to recommend strict compliance with Lovenox  1 mg/kg daily.  Our office will call patient to notify him that he can STOP his Plavix , and to reinforce compliance with Lovenox .  Otherwise, he has been discharged from hematology clinic and can follow-up with PCP and other specialist as indicated.  Pleasant CHRISTELLA Barefoot, PA-C 01/14/24 12:22 PM

## 2024-05-12 ENCOUNTER — Other Ambulatory Visit: Payer: Self-pay

## 2024-05-12 ENCOUNTER — Observation Stay (HOSPITAL_COMMUNITY)

## 2024-05-12 ENCOUNTER — Emergency Department (HOSPITAL_COMMUNITY)

## 2024-05-12 ENCOUNTER — Encounter (HOSPITAL_COMMUNITY): Payer: Self-pay | Admitting: Emergency Medicine

## 2024-05-12 ENCOUNTER — Observation Stay (HOSPITAL_COMMUNITY)
Admission: EM | Admit: 2024-05-12 | Source: Home / Self Care | Attending: Emergency Medicine | Admitting: Internal Medicine

## 2024-05-12 DIAGNOSIS — I10 Essential (primary) hypertension: Secondary | ICD-10-CM | POA: Diagnosis present

## 2024-05-12 DIAGNOSIS — Z8673 Personal history of transient ischemic attack (TIA), and cerebral infarction without residual deficits: Secondary | ICD-10-CM

## 2024-05-12 DIAGNOSIS — I2510 Atherosclerotic heart disease of native coronary artery without angina pectoris: Secondary | ICD-10-CM | POA: Diagnosis present

## 2024-05-12 DIAGNOSIS — N1831 Chronic kidney disease, stage 3a: Secondary | ICD-10-CM | POA: Diagnosis present

## 2024-05-12 DIAGNOSIS — I513 Intracardiac thrombosis, not elsewhere classified: Secondary | ICD-10-CM | POA: Diagnosis present

## 2024-05-12 DIAGNOSIS — I639 Cerebral infarction, unspecified: Secondary | ICD-10-CM

## 2024-05-12 DIAGNOSIS — I5022 Chronic systolic (congestive) heart failure: Secondary | ICD-10-CM | POA: Insufficient documentation

## 2024-05-12 LAB — COMPREHENSIVE METABOLIC PANEL WITH GFR
ALT: 48 U/L — ABNORMAL HIGH (ref 0–44)
AST: 29 U/L (ref 15–41)
Albumin: 4.6 g/dL (ref 3.5–5.0)
Alkaline Phosphatase: 59 U/L (ref 38–126)
Anion gap: 17 — ABNORMAL HIGH (ref 5–15)
BUN: 15 mg/dL (ref 8–23)
CO2: 23 mmol/L (ref 22–32)
Calcium: 9.2 mg/dL (ref 8.9–10.3)
Chloride: 102 mmol/L (ref 98–111)
Creatinine, Ser: 1.59 mg/dL — ABNORMAL HIGH (ref 0.61–1.24)
GFR, Estimated: 44 mL/min — ABNORMAL LOW
Glucose, Bld: 155 mg/dL — ABNORMAL HIGH (ref 70–99)
Potassium: 3.4 mmol/L — ABNORMAL LOW (ref 3.5–5.1)
Sodium: 141 mmol/L (ref 135–145)
Total Bilirubin: 1.6 mg/dL — ABNORMAL HIGH (ref 0.0–1.2)
Total Protein: 8 g/dL (ref 6.5–8.1)

## 2024-05-12 LAB — URINE DRUG SCREEN
Amphetamines: NEGATIVE
Barbiturates: NEGATIVE
Benzodiazepines: NEGATIVE
Cocaine: NEGATIVE
Fentanyl: NEGATIVE
Methadone Scn, Ur: NEGATIVE
Opiates: NEGATIVE
Tetrahydrocannabinol: NEGATIVE

## 2024-05-12 LAB — CBC
HCT: 54.3 % — ABNORMAL HIGH (ref 39.0–52.0)
Hemoglobin: 17.2 g/dL — ABNORMAL HIGH (ref 13.0–17.0)
MCH: 29.2 pg (ref 26.0–34.0)
MCHC: 31.7 g/dL (ref 30.0–36.0)
MCV: 92 fL (ref 80.0–100.0)
Platelets: 158 10*3/uL (ref 150–400)
RBC: 5.9 MIL/uL — ABNORMAL HIGH (ref 4.22–5.81)
RDW: 13.8 % (ref 11.5–15.5)
WBC: 6.8 10*3/uL (ref 4.0–10.5)
nRBC: 0 % (ref 0.0–0.2)

## 2024-05-12 LAB — I-STAT CHEM 8, ED
BUN: 18 mg/dL (ref 8–23)
Calcium, Ion: 0.91 mmol/L — ABNORMAL LOW (ref 1.15–1.40)
Chloride: 104 mmol/L (ref 98–111)
Creatinine, Ser: 1.8 mg/dL — ABNORMAL HIGH (ref 0.61–1.24)
Glucose, Bld: 155 mg/dL — ABNORMAL HIGH (ref 70–99)
HCT: 56 % — ABNORMAL HIGH (ref 39.0–52.0)
Hemoglobin: 19 g/dL — ABNORMAL HIGH (ref 13.0–17.0)
Potassium: 3.6 mmol/L (ref 3.5–5.1)
Sodium: 139 mmol/L (ref 135–145)
TCO2: 24 mmol/L (ref 22–32)

## 2024-05-12 LAB — APTT: aPTT: 30 s (ref 24–36)

## 2024-05-12 LAB — HEMOGLOBIN A1C
Hgb A1c MFr Bld: 5.8 % — ABNORMAL HIGH (ref 4.8–5.6)
Mean Plasma Glucose: 119.76 mg/dL

## 2024-05-12 LAB — DIFFERENTIAL
Abs Immature Granulocytes: 0.01 10*3/uL (ref 0.00–0.07)
Basophils Absolute: 0.1 10*3/uL (ref 0.0–0.1)
Basophils Relative: 1 %
Eosinophils Absolute: 0.1 10*3/uL (ref 0.0–0.5)
Eosinophils Relative: 2 %
Immature Granulocytes: 0 %
Lymphocytes Relative: 53 %
Lymphs Abs: 3.7 10*3/uL (ref 0.7–4.0)
Monocytes Absolute: 0.6 10*3/uL (ref 0.1–1.0)
Monocytes Relative: 9 %
Neutro Abs: 2.4 10*3/uL (ref 1.7–7.7)
Neutrophils Relative %: 35 %

## 2024-05-12 LAB — PROTIME-INR
INR: 1 (ref 0.8–1.2)
Prothrombin Time: 13.7 s (ref 11.4–15.2)

## 2024-05-12 LAB — ETHANOL: Alcohol, Ethyl (B): <15 mg/dL

## 2024-05-12 LAB — CBG MONITORING, ED: Glucose-Capillary: 132 mg/dL — ABNORMAL HIGH (ref 70–99)

## 2024-05-12 MED ORDER — ACETAMINOPHEN 160 MG/5ML PO SOLN: 650.0000 mg | ORAL | Status: AC | PRN

## 2024-05-12 MED ORDER — POTASSIUM CHLORIDE 10 MEQ/100ML IV SOLN
10.0000 meq | INTRAVENOUS | Status: AC
  Administered 2024-05-12 (×4): 10 meq via INTRAVENOUS
  Filled 2024-05-12 (×4): qty 100

## 2024-05-12 MED ORDER — ACETAMINOPHEN 650 MG RE SUPP: 650.0000 mg | RECTAL | Status: AC | PRN

## 2024-05-12 MED ORDER — ATORVASTATIN CALCIUM 40 MG PO TABS
40.0000 mg | ORAL_TABLET | Freq: Every evening | ORAL | Status: AC
  Administered 2024-05-12 – 2024-05-13 (×2): 40 mg via ORAL
  Filled 2024-05-12 (×2): qty 1

## 2024-05-12 MED ORDER — ASPIRIN 300 MG RE SUPP
300.0000 mg | Freq: Once | RECTAL | Status: AC
  Administered 2024-05-12: 300 mg via RECTAL
  Filled 2024-05-12: qty 1

## 2024-05-12 MED ORDER — SODIUM CHLORIDE 0.9 % IV SOLN: INTRAVENOUS | Status: AC

## 2024-05-12 MED ORDER — IOHEXOL 350 MG/ML SOLN
100.0000 mL | Freq: Once | INTRAVENOUS | Status: AC | PRN
  Administered 2024-05-12: 100 mL via INTRAVENOUS

## 2024-05-12 MED ORDER — ENOXAPARIN SODIUM 60 MG/0.6ML IJ SOSY
50.0000 mg | PREFILLED_SYRINGE | INTRAMUSCULAR | Status: DC
  Administered 2024-05-12: 50 mg via SUBCUTANEOUS
  Filled 2024-05-12: qty 0.6

## 2024-05-12 MED ORDER — EMPAGLIFLOZIN 10 MG PO TABS
10.0000 mg | ORAL_TABLET | Freq: Every day | ORAL | Status: AC
  Administered 2024-05-12 – 2024-05-13 (×2): 10 mg via ORAL
  Filled 2024-05-12 (×4): qty 1

## 2024-05-12 MED ORDER — ACETAMINOPHEN 325 MG PO TABS: 650.0000 mg | ORAL_TABLET | ORAL | Status: AC | PRN

## 2024-05-12 MED ORDER — SENNOSIDES-DOCUSATE SODIUM 8.6-50 MG PO TABS: 1.0000 | ORAL_TABLET | Freq: Every evening | ORAL | Status: AC | PRN

## 2024-05-12 MED ORDER — STROKE: EARLY STAGES OF RECOVERY BOOK
Freq: Once | Status: AC
  Filled 2024-05-12: qty 1

## 2024-05-12 NOTE — ED Notes (Signed)
 Pt transported to MRI

## 2024-05-12 NOTE — H&P (Signed)
 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 Notably, the patient has failed both apixaban  and warfarin.  He is at recurrent PE and strokes on the prior anticoagulation.  He has been on Lovenox  1 mg/kg twice daily since 07/07/2022. Unfortunately, the patient has only been giving himself Lovenox  once daily rather than the prescribed twice daily dosing.  Daughter states that he has been doing this for about 2 months.  Daughter states that he has been taking some other antiplatelet therapy and lieu of his second injection. He has been compliant with all his other medications. Daughter gave him his Lovenox  injection on the evening of 05/11/2024 around 6:45 PM.  The patient woke up around 630A on the morning of 05/12/2024 with right facial droop and dysarthria and right sided weakness. He denies any headache, chest pain, shortness breath, abdominal pain, nausea, vomiting, diarrhea.  There is no dysuria or hematuria.  He denies any dysesthesias.  He does have dysarthria.  In the ED, the patient was afebrile and hemodynamically stable with oxygen  saturation 98% on room air.  WBC 6.8, hemoglobin 17.2, platelets 158.  Sodium 141, potassium 3.4, bicarbonate 23, serum creatinine 1.59.  AST 29, ALT 40, alk phosphatase 59, total bilirubin 1.6.  EKG showed sinus rhythm with no ST just T wave changes.  CTA head and neck was negative for LVO.  There is moderate to severe right vertebral  artery stenosis.  CT perfusion showed a small volume infarct with surrounding oligemia in the left MCA territory.  CT brain was negative for acute findings.  There is chronic bilateral infarcts. The patient was seen by teleneurology who recommended holding his Lovenox  pending MRI to evaluate the size of his stroke.  They recommended admission for further workup.  Review of Systems: As mentioned in the history of present illness. All other systems reviewed and are negative. Past Medical History:  Diagnosis Date   Apical mural thrombus    Arteriosclerotic cardiovascular disease (ASCVD)    a. s/p CABG in 2011 b. DES to RCA in 08/2017 c. cath in 06/2018 showing patent LIMA-LAD, SVG-OM1-OM2 and SVG-D1   Chronic anticoagulation    Chronic systolic CHF (congestive heart failure) (HCC)    a. EF 45-50% in 06/2012 b. 35-40% in 08/2017 with similar results in 06/2018 c. EF at 30-35% in 10/2020   CVA (cerebral vascular accident) (HCC)    HTN (hypertension)    Hyperlipidemia    Keloid    median sternotomy   Pulmonary embolism (HCC) 03/2010   Substance abuse (HCC)    formerly cocaine   Past Surgical History:  Procedure Laterality Date   COLONOSCOPY N/A 01/22/2017   Procedure: COLONOSCOPY;  Surgeon: Harvey Margo LITTIE, MD;  Location: [facility];  Service: Endoscopy;  Laterality: N/A;  200   CORONARY ARTERY BYPASS GRAFT  03/18/2010   LIMA-LAD, SVG to diagonal, OM1 & OM2   CORONARY STENT INTERVENTION N/A 08/21/2017   Procedure: CORONARY STENT INTERVENTION;  Surgeon: Wonda Sharper, MD;  Location: [facility] INVASIVE CV LAB;  Service: Cardiovascular;  Laterality: N/A;   LEFT HEART CATH AND CORS/GRAFTS ANGIOGRAPHY N/A 08/21/2017   Procedure: LEFT HEART CATH AND CORS/GRAFTS ANGIOGRAPHY;  Surgeon: Wonda Sharper, MD;  [facility]: [facility] INVASIVE CV LAB;  Service: Cardiovascular;  Laterality: N/A;   LEFT HEART CATH AND CORS/GRAFTS ANGIOGRAPHY N/A 06/21/2018   Procedure: LEFT HEART CATH AND CORS/GRAFTS ANGIOGRAPHY;   Surgeon: Mady Bruckner, MD;  Location: [facility] INVASIVE CV LAB;  Service: Cardiovascular;  Laterality: N/A;   POLYPECTOMY  01/22/2017   Procedure: POLYPECTOMY;  Surgeon: Harvey Margo CROME, MD;  Location: [facility];  Service: Endoscopy;;  Transverse(CS) and sigmoid colon(HS)   PTCA  06/1996   LAD & RCA   TEE WITHOUT CARDIOVERSION N/A 06/10/2012   Procedure: TRANSESOPHAGEAL ECHOCARDIOGRAM (TEE);  Surgeon: Maude JAYSON Emmer, MD;  Location: [facility];  Service: Cardiovascular;  Laterality: N/A;   Social History:  reports that he quit smoking about 22 years ago. His smoking use included cigarettes. He has never used smokeless tobacco. He reports that he does not currently use alcohol . He reports that he does not use drugs.  Allergies[1]  Family History  Problem Relation Age of Onset   Cancer Mother 36   Stroke Daughter     Prior to Admission medications  Medication Sig Start Date End Date Taking? Authorizing Provider  atorvastatin  (LIPITOR) 40 MG tablet Take 40 mg by mouth daily. 03/29/23   [provider]  Cal Carb-Mag Hydrox-Simeth (ROLAIDS ADVANCED) 1000-200-40 MG CHEW Chew 1-2 tablets by mouth daily as needed (heartburn).    [provider]  cyclobenzaprine  (FLEXERIL ) 5 MG tablet Take 5 mg by mouth 2 (two) times daily as needed. 06/24/23   [provider]  diclofenac  Sodium (VOLTAREN ) 1 % GEL Apply 2 g topically 4 (four) times daily. 01/08/24   Daralene Bruckner D, PA-C  empagliflozin  (JARDIANCE ) 10 MG TABS tablet TAKE 1 TABLET BY MOUTH EVERY DAY BEFORE BREAKFAST 12/23/22   Okey Vina GAILS, MD  enoxaparin  (LOVENOX ) 100 MG/ML injection INJECT 0.95 MLS (95 MG TOTAL) INTO THE SKIN EVERY 12 (TWELVE) HOURS. 10/22/23   Lamon Pleasant HERO, PA-C  furosemide  (LASIX ) 20 MG tablet TAKE 3 TABLETS BY MOUTH EVERY DAY 10/02/20   Okey Vina GAILS, MD  hydrALAZINE  (APRESOLINE ) 25 MG tablet Take 25 mg by mouth 3 (three) times daily. 06/24/23   [provider]  isosorbide   mononitrate (IMDUR ) 60 MG 24 hr tablet Take 1 tablet (60 mg total) by mouth daily. 09/12/20 01/06/24  Okey Vina GAILS, MD  metoprolol  succinate (TOPROL -XL) 50 MG 24 hr tablet Take 50 mg by mouth daily. 05/28/23   [provider]  nitroGLYCERIN  (NITROSTAT ) 0.4 MG SL tablet Place 1 tablet (0.4 mg total) under the tongue every 5 (five) minutes as needed for chest pain. up to 3 doses. 07/05/13   Jerilynn Lamarr HERO, NP  spironolactone  (ALDACTONE ) 25 MG tablet TAKE 1 TABLET (25 MG TOTAL) BY MOUTH DAILY. 06/24/23 06/18/24  Johnson Laymon HERO, PA-C    Physical Exam: Vitals:   05/12/24 0930 05/12/24 0945 05/12/24 1000 05/12/24 1002  BP: (!) 151/74 136/72 (!) 167/77 (!) 167/77  Pulse: 73 74 74 72  Resp: 15 18 (!) 9 15  Temp:      TempSrc:      SpO2: 97% 96% 96% 96%  Weight:      Height:       GENERAL:  A&O x 3, NAD, well developed, cooperative, follows commands HEENT: [place]/AT, No thrush, No  icterus, No oral ulcers Neck:  No neck mass, No meningismus, soft, supple CV: RRR, no S3, no S4, no rub, no JVD Lungs:  CTA, no wheeze, no rhonchi, good air movement Abd: soft/NT +BS, nondistended Ext: No edema, no lymphangitis, no cyanosis, no rashes Neuro:  CN II-XII intact, strength 4-/5 in RUE, 4/5 RLE, strength 4/5 LUE, LLE; sensation intact bilateral; no dysmetria; babinski equivocal  Data Reviewed: Data reviewed above in the history  Assessment and Plan: Acute ischemic stroke -Appreciate Neurology Consult -PT/OT evaluation -Speech therapy eval -CT brain--neg for acute finding -MRI brain-- -CTA H&N--negative LVO.   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  CT perfusion showed a small volume infarct with surrounding oligemia in the left MCA territory.  -Echo-- -LDL-- -HbA1C-- -Antiplatelet--ASA PR  Chronic HFpEF -Patient is clinically euvolemic -Continue home doses of hydralazine , Imdur , Jardiance , -Holding spironolactone , metoprolol  succinate to allow for permissive  hypertension -08/28/2021 echo EF 35-40%, no LV thrombus, normal RV function, +WMA - 10/02/2023 echo EF 35-40%, grade 1 DD, akinetic apex, small apical thrombus  LV thrombus History/ History of PE - Has failed warfarin and apixaban  - Was supposed to be taking Lovenox  twice daily at home   CKD stage IIIa -Baseline creatinine 1.5-1.7   Mixed hyperlipidemia -Continue statin   Coronary artery disease -restart Plavix  if able to tolerate p.o. - No chest pain presently  Essential hypertension - Holding metoprolol , Imdur , hydralazine  to allow for permissive hypertension    Advance Care Planning: FULL  Consults: neurology  Family Communication: daughters 2/5  Severity of Illness: The appropriate patient status for this patient is OBSERVATION. Observation status is judged to be reasonable and necessary in order to provide the required intensity of service to ensure the patient's safety. The patient's presenting symptoms, physical exam findings, and initial radiographic and laboratory data in the context of their medical condition is felt to place them at decreased risk for further clinical deterioration. Furthermore, it is anticipated that the patient will be medically stable for discharge from the hospital within 2 midnights of admission.   Author: Alm Schneider, MD 05/12/2024 10:25 AM  For on call review [email or web address].     [1]  Allergies Allergen Reactions   Benadryl [Diphenhydramine]    Lisinopril Swelling and Other (See Comments)    Mouth and tongue swells   Penicillins    "

## 2024-05-12 NOTE — ED Provider Notes (Signed)
 " [place] EMERGENCY DEPARTMENT AT [facility] Provider Note   CSN: [id number] Arrival date & time: 05/12/24  9268     Patient presents with: Code Stroke   [name] is a 79 y.o. male.  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  Patient went to bed last night was fine and then this morning when he woke up he had facial droop on the right with right arm weakness and dysarthria  The history is provided by the patient, the EMS personnel and a relative. No language interpreter was used.  Weakness Severity:  Moderate Onset quality:  Sudden Timing:  Constant Progression:  Unchanged Chronicity:  New Context: not alcohol  use   Relieved by:  Nothing Worsened by:  Nothing Ineffective treatments:  None tried      Prior to Admission medications  Medication Sig Start Date End Date Taking? Authorizing Provider  atorvastatin  (LIPITOR) 40 MG tablet Take 40 mg by mouth daily. 03/29/23   [provider]  Cal Carb-Mag Hydrox-Simeth (ROLAIDS ADVANCED) 1000-200-40 MG CHEW Chew 1-2 tablets by mouth daily as needed (heartburn).    [provider]  cyclobenzaprine  (FLEXERIL ) 5 MG tablet Take 5 mg by mouth 2 (two) times daily as needed. 06/24/23   [provider]  diclofenac  Sodium (VOLTAREN ) 1 % GEL Apply 2 g topically 4 (four) times daily. 01/08/24   Daralene Bruckner D, PA-C  empagliflozin  (JARDIANCE ) 10 MG TABS tablet TAKE 1 TABLET BY MOUTH EVERY DAY BEFORE BREAKFAST 12/23/22   Okey Vina GAILS, MD  enoxaparin  (LOVENOX ) 100 MG/ML injection INJECT 0.95 MLS (95 MG TOTAL) INTO THE SKIN EVERY 12 (TWELVE) HOURS. 10/22/23   Lamon Pleasant HERO, PA-C  furosemide  (LASIX ) 20 MG tablet TAKE 3 TABLETS BY MOUTH EVERY DAY 10/02/20   Okey Vina GAILS, MD  hydrALAZINE  (APRESOLINE ) 25 MG tablet Take 25 mg by mouth 3 (three)  times daily. 06/24/23   [provider]  isosorbide  mononitrate (IMDUR ) 60 MG 24 hr tablet Take 1 tablet (60 mg total) by mouth daily. 09/12/20 01/06/24  Okey Vina GAILS, MD  metoprolol  succinate (TOPROL -XL) 50 MG 24 hr tablet Take 50 mg by mouth daily. 05/28/23   [provider]  nitroGLYCERIN  (NITROSTAT ) 0.4 MG SL tablet Place 1 tablet (0.4 mg total) under the tongue every 5 (five) minutes as needed for chest pain. up to 3 doses. 07/05/13   Jerilynn Lamarr HERO, NP  spironolactone  (ALDACTONE ) 25 MG tablet TAKE 1 TABLET (25 MG TOTAL) BY MOUTH DAILY. 06/24/23 06/18/24  Johnson Laymon HERO, PA-C    Allergies: Benadryl [diphenhydramine], Lisinopril, and Penicillins    Review of Systems  Unable to perform ROS: Mental status change    Updated Vital Signs BP (!) 127/56   Pulse 72   Temp 98.1 F (36.7 C) (Oral)   Resp 14   Ht 5' 6 (1.676 m)   Wt 96.6 kg   SpO2 98%   BMI 34.37 kg/m   Physical Exam Vitals and nursing note reviewed.  Constitutional:      Appearance: He is well-developed.  HENT:     Head: Normocephalic.     Comments: Right facial droop    Nose: Nose normal.     Mouth/Throat:     Mouth: Mucous membranes are moist.  Eyes:     General: No scleral icterus.    Conjunctiva/sclera: Conjunctivae  normal.  Neck:     Thyroid : No thyromegaly.  Cardiovascular:     Rate and Rhythm: Normal rate and regular rhythm.     Heart sounds: No murmur heard.    No friction rub. No gallop.  Pulmonary:     Breath sounds: No stridor. No wheezing or rales.  Chest:     Chest wall: No tenderness.  Abdominal:     General: There is no distension.     Tenderness: There is no abdominal tenderness. There is no rebound.  Musculoskeletal:     Cervical back: Neck supple.     Comments: Significant weakness in his right arm  Lymphadenopathy:     Cervical: No cervical adenopathy.  Skin:    Findings: No erythema or rash.  Neurological:     Mental Status: He is alert.     Motor: No  abnormal muscle tone.     Coordination: Coordination normal.     Comments: Patient with significant dysarthria  Psychiatric:        Behavior: Behavior normal.     (all labs ordered are listed, but only abnormal results are displayed) Labs Reviewed  CBC - Abnormal; Notable for the following components:      Result Value   RBC 5.90 (*)    Hemoglobin 17.2 (*)    HCT 54.3 (*)    All other components within normal limits  COMPREHENSIVE METABOLIC PANEL WITH GFR - Abnormal; Notable for the following components:   Potassium 3.4 (*)    Glucose, Bld 155 (*)    Creatinine, Ser 1.59 (*)    ALT 48 (*)    Total Bilirubin 1.6 (*)    GFR, Estimated 44 (*)    Anion gap 17 (*)    All other components within normal limits  I-STAT CHEM 8, ED - Abnormal; Notable for the following components:   Creatinine, Ser 1.80 (*)    Glucose, Bld 155 (*)    Calcium , Ion 0.91 (*)    Hemoglobin 19.0 (*)    HCT 56.0 (*)    All other components within normal limits  CBG MONITORING, ED - Abnormal; Notable for the following components:   Glucose-Capillary 132 (*)    All other components within normal limits  PROTIME-INR  APTT  DIFFERENTIAL  ETHANOL  URINE DRUG SCREEN    EKG: None  Radiology: CT ANGIO HEAD W OR WO CONTRAST Result Date: 05/12/2024 EXAM: CTA Head and Neck with Perfusion 05/12/2024 08:19:43 AM TECHNIQUE: CTA of the head and neck was performed with the administration of 100 mL of iohexol  (OMNIPAQUE ) 350 MG/ML injection. 3D postprocessing with multiplanar reconstructions and MIPs was performed to evaluate the vascular anatomy. Cerebral perfusion analysis using computed tomography with contrast administration, including post-processing of parametric maps with determination of cerebral blood flow, cerebral blood volume, mean transit time and time-to-maximum. Automated exposure control, iterative reconstruction, and/or weight based adjustment of the mA/kV was utilized to reduce the radiation dose to as  low as reasonably achievable. COMPARISON: Head CT reported separately today (05/12/2024). CTA Head and Neck 02/28/2023. CLINICAL HISTORY: 79 year old male. Code stroke presentation. SABRA FINDINGS: CTA NECK: AORTIC ARCH AND ARCH VESSELS: Moderate aortic arch 3-vessel arch configuration. No dissection or arterial injury. No significant stenosis of the brachiocephalic or subclavian arteries. Proximal left subclavian artery mostly soft atherosclerotic plaque without stenosis. Chronic brachiocephalic artery atherosclerosis without stenosis. CERVICAL CAROTID ARTERIES: Chronic cervical carotid atherosclerosis without stenosis. No dissection or arterial injury. No hemodynamically significant stenosis by NASCET criteria. CERVICAL VERTEBRAL  ARTERIES: Soft and calcified atherosclerosis at the right vertebral artery origin on series 8 image 267 resulting in moderate to severe stenosis which appears progressed since 2024. Right vertebral artery remains patent with no other stenosis. There is right V4 mild to moderate calcified plaque. Patent right PICA origin. No left vertebral origin stenosis. Codominant left vertebral artery with mild left V4 segment atherosclerosis and no stenosis to the vertebrobasilar junction. Normal left PICA origin. No dissection or arterial injury. LUNGS AND MEDIASTINUM: Bilateral emphysema. Previous CABG. BONES: Median sternotomy. 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 CTA HEAD: ANTERIOR CIRCULATION: Moderate right ICA siphon calcified atherosclerosis with mild right cavernous segment stenosis. Left ICA siphon similar mild to moderate calcified atherosclerosis and mild stenosis at the left anterior genu. These ICA siphon findings are mildly progressed since 2024. Normal MCA and ACA origins. Diminutive or absent anterior communicating artery. Left MCA M1 segment and trifurcation are patent without stenosis. No left MCA branch stenosis or occlusion is identified when compared to 2024. No  aneurysm. POSTERIOR CIRCULATION: Normal SCA and PCA origins. Diminutive or absent posterior communicating arteries. Mild PCA P3 segment irregularity without significant stenosis. No significant stenosis of the basilar artery. No aneurysm. OTHER: Major dural venous sinuses enhancing and appear patent. CT PERFUSION: EXAM QUALITY: Exam quality is adequate with diagnostic perfusion maps. No significant motion artifact. Appropriate arterial inflow and venous outflow curves. CORE INFARCT (CBF<30% volume): 10 mL TOTAL HYPOPERFUSION (Tmax>6s volume): 17 mL PENUMBRA: Mismatch volume: 7 mL Mismatch ratio: 1.7 Location: Left MCA. IMPRESSION: 1. CTP is positive but CTA is negative for large vessel occlusion. Moderate to severe right vertebral artery origin atherosclerotic stenosis is progressed since 2024. ICA siphon calcified atherosclerosis is progressed, but no other hemodynamically significant stenosis. 2. CT Perfusion does detect a small volume of infarct with surrounding oligemia in the left MCA territory (Tmax 17 mL, CBF 10 mL). 3. Aortic Atherosclerosis (ICD10-I70.0). 4. Emphysema (ICD10-J43.9). Electronically signed by: Helayne Hurst MD 05/12/2024 08:35 AM EST RP Workstation: [id number]   CT ANGIO NECK W OR WO CONTRAST Result Date: 05/12/2024 EXAM: CTA Head and Neck with Perfusion 05/12/2024 08:19:43 AM TECHNIQUE: CTA of the head and neck was performed with the administration of 100 mL of iohexol  (OMNIPAQUE ) 350 MG/ML injection. 3D postprocessing with multiplanar reconstructions and MIPs was performed to evaluate the vascular anatomy. Cerebral perfusion analysis using computed tomography with contrast administration, including post-processing of parametric maps with determination of cerebral blood flow, cerebral blood volume, mean transit time and time-to-maximum. Automated exposure control, iterative reconstruction, and/or weight based adjustment of the mA/kV was utilized to reduce the radiation dose to as low as  reasonably achievable. COMPARISON: Head CT reported separately today (05/12/2024). CTA Head and Neck 02/28/2023. CLINICAL HISTORY: 79 year old male. Code stroke presentation. SABRA FINDINGS: CTA NECK: AORTIC ARCH AND ARCH VESSELS: Moderate aortic arch 3-vessel arch configuration. No dissection or arterial injury. No significant stenosis of the brachiocephalic or subclavian arteries. Proximal left subclavian artery mostly soft atherosclerotic plaque without stenosis. Chronic brachiocephalic artery atherosclerosis without stenosis. CERVICAL CAROTID ARTERIES: Chronic cervical carotid atherosclerosis without stenosis. No dissection or arterial injury. No hemodynamically significant stenosis by NASCET criteria. CERVICAL VERTEBRAL ARTERIES: Soft and calcified atherosclerosis at the right vertebral artery origin on series 8 image 267 resulting in moderate to severe stenosis which appears progressed since 2024. Right vertebral artery remains patent with no other stenosis. There is right V4 mild to moderate calcified plaque. Patent right PICA origin. No left vertebral origin stenosis. Codominant left vertebral artery with  mild left V4 segment atherosclerosis and no stenosis to the vertebrobasilar junction. Normal left PICA origin. No dissection or arterial injury. LUNGS AND MEDIASTINUM: Bilateral emphysema. Previous CABG. BONES: Median sternotomy. 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 CTA HEAD: ANTERIOR CIRCULATION: Moderate right ICA siphon calcified atherosclerosis with mild right cavernous segment stenosis. Left ICA siphon similar mild to moderate calcified atherosclerosis and mild stenosis at the left anterior genu. These ICA siphon findings are mildly progressed since 2024. Normal MCA and ACA origins. Diminutive or absent anterior communicating artery. Left MCA M1 segment and trifurcation are patent without stenosis. No left MCA branch stenosis or occlusion is identified when compared to 2024. No aneurysm.  POSTERIOR CIRCULATION: Normal SCA and PCA origins. Diminutive or absent posterior communicating arteries. Mild PCA P3 segment irregularity without significant stenosis. No significant stenosis of the basilar artery. No aneurysm. OTHER: Major dural venous sinuses enhancing and appear patent. CT PERFUSION: EXAM QUALITY: Exam quality is adequate with diagnostic perfusion maps. No significant motion artifact. Appropriate arterial inflow and venous outflow curves. CORE INFARCT (CBF<30% volume): 10 mL TOTAL HYPOPERFUSION (Tmax>6s volume): 17 mL PENUMBRA: Mismatch volume: 7 mL Mismatch ratio: 1.7 Location: Left MCA. IMPRESSION: 1. CTP is positive but CTA is negative for large vessel occlusion. Moderate to severe right vertebral artery origin atherosclerotic stenosis is progressed since 2024. ICA siphon calcified atherosclerosis is progressed, but no other hemodynamically significant stenosis. 2. CT Perfusion does detect a small volume of infarct with surrounding oligemia in the left MCA territory (Tmax 17 mL, CBF 10 mL). 3. Aortic Atherosclerosis (ICD10-I70.0). 4. Emphysema (ICD10-J43.9). Electronically signed by: Helayne Hurst MD 05/12/2024 08:35 AM EST RP Workstation: [id number]   CT CEREBRAL PERFUSION W CONTRAST Result Date: 05/12/2024 EXAM: CTA Head and Neck with Perfusion 05/12/2024 08:19:43 AM TECHNIQUE: CTA of the head and neck was performed with the administration of 100 mL of iohexol  (OMNIPAQUE ) 350 MG/ML injection. 3D postprocessing with multiplanar reconstructions and MIPs was performed to evaluate the vascular anatomy. Cerebral perfusion analysis using computed tomography with contrast administration, including post-processing of parametric maps with determination of cerebral blood flow, cerebral blood volume, mean transit time and time-to-maximum. Automated exposure control, iterative reconstruction, and/or weight based adjustment of the mA/kV was utilized to reduce the radiation dose to as low as reasonably  achievable. COMPARISON: Head CT reported separately today (05/12/2024). CTA Head and Neck 02/28/2023. CLINICAL HISTORY: 79 year old male. Code stroke presentation. SABRA FINDINGS: CTA NECK: AORTIC ARCH AND ARCH VESSELS: Moderate aortic arch 3-vessel arch configuration. No dissection or arterial injury. No significant stenosis of the brachiocephalic or subclavian arteries. Proximal left subclavian artery mostly soft atherosclerotic plaque without stenosis. Chronic brachiocephalic artery atherosclerosis without stenosis. CERVICAL CAROTID ARTERIES: Chronic cervical carotid atherosclerosis without stenosis. No dissection or arterial injury. No hemodynamically significant stenosis by NASCET criteria. CERVICAL VERTEBRAL ARTERIES: Soft and calcified atherosclerosis at the right vertebral artery origin on series 8 image 267 resulting in moderate to severe stenosis which appears progressed since 2024. Right vertebral artery remains patent with no other stenosis. There is right V4 mild to moderate calcified plaque. Patent right PICA origin. No left vertebral origin stenosis. Codominant left vertebral artery with mild left V4 segment atherosclerosis and no stenosis to the vertebrobasilar junction. Normal left PICA origin. No dissection or arterial injury. LUNGS AND MEDIASTINUM: Bilateral emphysema. Previous CABG. BONES: Median sternotomy. 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 CTA HEAD: ANTERIOR CIRCULATION: Moderate right ICA siphon calcified atherosclerosis with mild right cavernous segment stenosis. Left ICA siphon similar mild to moderate  calcified atherosclerosis and mild stenosis at the left anterior genu. These ICA siphon findings are mildly progressed since 2024. Normal MCA and ACA origins. Diminutive or absent anterior communicating artery. Left MCA M1 segment and trifurcation are patent without stenosis. No left MCA branch stenosis or occlusion is identified when compared to 2024. No aneurysm. POSTERIOR  CIRCULATION: Normal SCA and PCA origins. Diminutive or absent posterior communicating arteries. Mild PCA P3 segment irregularity without significant stenosis. No significant stenosis of the basilar artery. No aneurysm. OTHER: Major dural venous sinuses enhancing and appear patent. CT PERFUSION: EXAM QUALITY: Exam quality is adequate with diagnostic perfusion maps. No significant motion artifact. Appropriate arterial inflow and venous outflow curves. CORE INFARCT (CBF<30% volume): 10 mL TOTAL HYPOPERFUSION (Tmax>6s volume): 17 mL PENUMBRA: Mismatch volume: 7 mL Mismatch ratio: 1.7 Location: Left MCA. IMPRESSION: 1. CTP is positive but CTA is negative for large vessel occlusion. Moderate to severe right vertebral artery origin atherosclerotic stenosis is progressed since 2024. ICA siphon calcified atherosclerosis is progressed, but no other hemodynamically significant stenosis. 2. CT Perfusion does detect a small volume of infarct with surrounding oligemia in the left MCA territory (Tmax 17 mL, CBF 10 mL). 3. Aortic Atherosclerosis (ICD10-I70.0). 4. Emphysema (ICD10-J43.9). Electronically signed by: Helayne Hurst MD 05/12/2024 08:35 AM EST RP Workstation: [id number]   CT HEAD CODE STROKE WO CONTRAST ([name] 0-4.5h, LVO 0-24h) Addendum Date: 05/12/2024 ******** ADDENDUM #1 ******** ADDENDUM: Study discussed by telephone with Dr. Alasdair Kleve at 07:53 AM on 05/12/2024. ---------------------------------------------------- Electronically signed by: Helayne Hurst MD 05/12/2024 08:05 AM EST RP Workstation: [id number]   Result Date: 05/12/2024 ******** ORIGINAL REPORT ******** EXAM: CT HEAD WITHOUT CONTRAST 05/12/2024 07:43:46 AM TECHNIQUE: CT of the head was performed without the administration of intravenous contrast. Automated exposure control, iterative reconstruction, and/or weight based adjustment of the mA/kV was utilized to reduce the radiation dose to as low as reasonably achievable. COMPARISON: Brain MRI and head CT  04/06/2023. CLINICAL HISTORY: 79 year old male. Last known well 2300 hours, woke with facial swelling and left side facial droop. Acute neurologic deficit; stroke suspected. FINDINGS: BRAIN AND VENTRICLES: Chronic infarcts with encephalomalacia in the right cerebellum PICA territory, left PCA territory, anterior division left MCA territory, and smaller right occipital pole right PCA territory area of encephalomalacia. Chronic lacunar infarcts in the left thalamus. Additional patchy bilateral white matter hypodensity. Stable gray white differentiation since 2024. No suspicious intracranial vascular hyperdensity. Calcified atherosclerosis at the skull base. No acute hemorrhage. No evidence of acute infarct. No hydrocephalus. No extra-axial collection. No mass effect or midline shift. ORBITS: No gaze deviation. SINUSES: Paranasal sinuses, tympanic cavities, and mastoids are well aerated. SOFT TISSUES AND SKULL: No acute soft tissue abnormality. No skull fracture. stylomastoid foramina appear symmetric and negative. Congenital incomplete ossification of the posterior C1 ring, normal variant. ALBERTA STROKE PROGRAM EARLY CT SCORE (ASPECTS): Ganglionic (caudate, IC, lentiform nucleus, insula, M1-M3): 7. Supraganglionic (M4-M6): 3. Total: 10. IMPRESSION: 1. No acute intracranial abnormality, ASPECTS 10. 2. Stable since 2024 CT appearance of multifocal and bilateral chronic infarcts. Electronically signed by: Helayne Hurst MD 05/12/2024 07:51 AM EST RP Workstation: [id number]   05/12/2024    CRITICAL CARE Performed by: Fairy Sermon Total critical care time: 65 minutes Critical care time was exclusive of separately billable procedures and treating other patients. Critical care was necessary to treat or prevent imminent or life-threatening deterioration. Critical care was time spent personally by me on the following activities: development of treatment plan with patient and/or surrogate as well as nursing, discussions  with consultants, evaluation of patient's response to treatment, examination of patient, obtaining history from patient or surrogate, ordering and performing treatments and interventions, ordering and review of laboratory studies, ordering and review of radiographic studies, pulse oximetry and re-evaluation of patient's condition.   Code stroke was called.  Patient was seen by neurology and new stroke was seen on perfusion study.  No LVO.  Neurology recommended holding the Lovenox  for right now and giving rectal aspirin  and admitting to medicine with an MRI  {Document cardiac monitor, telemetry assessment procedure when appropriate:32947} Procedures   Medications Ordered in the ED  aspirin  suppository 300 mg (has no administration in time range)  iohexol  (OMNIPAQUE ) 350 MG/ML injection 100 mL (100 mLs Intravenous Contrast Given 05/12/24 0807)      {Click here for ABCD2, HEART and other calculators REFRESH Note before signing:1}                              Medical Decision Making Amount and/or Complexity of Data Reviewed Labs: ordered. Radiology: ordered.  Risk OTC drugs.   New stroke but not LVO.  Patient admitted to medicine for further workup  {Document critical care time when appropriate  Document review of labs and clinical decision tools ie CHADS2VASC2, etc  Document your independent review of radiology images and any outside records  Document your discussion with family members, caretakers and with consultants  Document social determinants of health affecting pt's care  Document your decision making why or why not admission, treatments were needed:32947:::1}   Final diagnoses:  None    ED Discharge Orders     None        "

## 2024-05-12 NOTE — Consult Note (Signed)
 Modified Barium Swallow Study  Patient Details  Name: [name] MRN: [id number] Date of Birth: [birth year]  Today's Date: 05/12/2024  Modified Barium Swallow completed.  Full report located under Chart Review in the Imaging Section.  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 Notably, the patient has failed both apixaban  and warfarin. He is at recurrent PE and strokes on the prior anticoagulation. He has been on Lovenox  1 mg/kg twice daily since 07/07/2022. Unfortunately, the patient has only been giving himself Lovenox  once daily rather than the prescribed twice daily dosing. Daughter states that he has been doing this for about 2 months. Daughter states that he has been taking some other antiplatelet therapy and lieu of his second injection. He has been compliant with all his other medications.MRI brain 2/5 indicated Acute left frontal cortical infarct 2. Old cortical infarcts in the left medial occipital lobe and right inferior cerebellum 3. Old lacunar infarct in the left thalamus. ST consulted for clinical swallow evaluation. Pt placed on a Dysphagia 1(puree)/nectar-thickened liquid diet via tsp.  MBS ordered to assess swallow function objectively and attempt compensatory strategies to A with dysphagia.   Clinical Impression Pt exhibits mild-moderate oropharyngeal dysphagia c/b interlabial escape with thin primarily, minimal escape to R lateral buccal cavity with slow, prolonged chewing with solid consistencies observed and eventually extracted from oral cavity prior to swallow initiation.  Pt required min verbal/visual cues throughout study. Delayed tongue motion (oral holding) observed intermittently during study.   Trace residue located in oral cavity on lingual surface/palate, but removed with subsequent swallows.  Swallow initiation varied at the posterior angle of the ramus to pyriform sinuses depending on consistency.  Frank aspiration (PAS 6) observed with larger bolus of thin liquids d/t Incomplete laryngeal vestibule closure observed  secondary to partial hyolaryngeal elevation.  Decreased tongue base retraction resulted in mild vallecular and pharyngeal wall residue collection which cleared with compensatory strategies of multiple swallows, liquid wash and head turn R with effortful swallow.  Esophageal distension/clearance appeared adequate, but barium tablet coated in puree consistency briefly became lodged in vallecular space with chin tuck and liquid wash/effortful repetitive swallows successful in dislodging tablet during study.  Recommend continue Dysphagia 1(puree)/thin liquids via tsp amounts for safety with ST f/u for dysphagia tx during acute stay. Medications crushed in puree for safety.  ST should f/u at next venue of care for continued ST and progression of diet/educating pt/family re: dysphagia. Factors that may increase risk of adverse event in presence of aspiration Noe & Lianne 2021): Poor general health and/or compromised immunity;Respiratory or GI disease;Reduced cognitive function;Frail or deconditioned;Presence of tubes (ETT, trach, NG, etc.);Aspiration of thick, dense, and/or acidic materials;Frequent aspiration of large volumes DIGEST Swallow Severity Rating*  Safety: 2  Efficiency:2  Overall Pharyngeal Swallow Severity: mild-moderate 1: mild; 2: moderate; 3: severe; 4: profound  *The Dynamic Imaging Grade of Swallowing Toxicity is standardized for the head and neck cancer population, however, demonstrates promising clinical applications across populations to standardize the clinical rating of pharyngeal swallow safety and severity.   Swallow Evaluation  Recommendations Recommendations: PO diet PO Diet Recommendation: Dysphagia 1 (Pureed);Thin liquids (Level 0) Liquid Administration via: Spoon Medication Administration: Crushed with puree Supervision: Full supervision/cueing for swallowing strategies;Staff to assist with self-feeding Swallowing  strategies  : Slow rate;Small bites/sips;Check for pocketing or oral holding;Check for anterior loss;effortful swallow;Multiple dry swallows after each bite/sip;Follow solids with liquids Postural changes: Position pt fully upright for meals Oral care recommendations: Oral care BID (2x/day);Staff/trained caregiver to provide oral care;Use suctioning for oral care Caregiver Recommendations: Have oral suction available      Pat Bellanie Matthew,M.S.,CCC-SLP 05/12/2024,5:41 PM

## 2024-05-12 NOTE — ED Notes (Signed)
 Pt returned from MRI

## 2024-05-12 NOTE — ED Notes (Signed)
 Pt transported for Swallow study.

## 2024-05-12 NOTE — ED Notes (Signed)
 Pt still in MRI

## 2024-05-12 NOTE — ED Notes (Signed)
Pt still in

## 2024-05-12 NOTE — Hospital Course (Addendum)
 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 Notably, the patient has failed both apixaban  and warfarin.  He is at recurrent PE and strokes on the prior anticoagulation.  He has been on Lovenox  1 mg/kg twice daily since 07/07/2022. Unfortunately, the patient has only been giving himself Lovenox  once daily rather than the prescribed twice daily dosing.  Daughter states that he has been doing this for about 2 months.  Daughter states that he has been taking some other antiplatelet therapy and lieu of his second injection. He has been compliant with all his other medications. Daughter gave him his Lovenox  injection on the evening of 05/11/2024 around 6:45 PM.  The patient woke up around 630A on the morning of 05/12/2024 with right facial droop and dysarthria and right sided weakness. He denies any headache, chest pain, shortness breath, abdominal pain, nausea, vomiting, diarrhea.  There is no dysuria or hematuria.  He denies any dysesthesias.  He does have dysarthria.  In the ED, the patient was afebrile and hemodynamically stable with oxygen  saturation 98% on room air.  WBC 6.8, hemoglobin 17.2, platelets 158.  Sodium 141, potassium 3.4, bicarbonate 23, serum creatinine 1.59.  AST 29, ALT 40, alk phosphatase 59, total bilirubin 1.6.  EKG showed sinus rhythm with no ST just T wave changes.  CTA head and neck was negative for LVO.  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  CT perfusion showed a small volume infarct with surrounding oligemia in the left MCA territory.  CT brain was negative for acute findings.  There is chronic bilateral infarcts. The patient was seen by teleneurology who recommended holding his Lovenox  pending MRI to evaluate the size of his stroke.   They recommended admission for further workup.  Pt was given rectal ASA in ED.

## 2024-05-12 NOTE — ED Triage Notes (Signed)
 Pt bib [facility] from home. EMS called code stroke in the field. PT [name] 2300 last night. Pt woke up at 0600 today and felt swelling in his face. Pt found to have left sided facial droop, swelling to  right side of face and unable to speak clearly. Pt had his neighbor call ems.

## 2024-05-12 NOTE — Consult Note (Signed)
 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 He presented with EMS, I spoke to his daughters Madelin and Glenys [phone] and [phone]). Glenys normally lives with him but yesterday stayed with her daughter. She came to give him a lovenox  shot at 6:45pm and he was normal at that time. She was unaware of what happened until this morning or how he got to the hospital. He has a difficult time communicating, does not follow commands.    On exam NIHSS 11, some chronic dysmetria on the right and homonymous hemianopsia. Additionally there is mild drift of the right leg and diminished sensation right face. he has facial asymmetry and marked dysarthria. [name] was 1845 yesterday when he received Lovenox . It is unclear when he went to bed or when his symptoms were recognized. [name] is 14 hours ago. For these reasons, he is not a candidate for IV thrombolytics. Risks of bleeding and harm outweigh potential benefit. CTA does not show large vessel occlusion, perfusion does show ischemia in the left MCA territory which corresponds to his clinical presentation. Will need MRI brain imaging on admission.  Our recommendations are outlined below.  Recommendations:        Stroke/Telemetry Floor       Neuro Checks (Q2)       Bedside Swallow Eval       DVT Prophylaxis       IV Fluids, Normal Saline       Head of Bed 30 Degrees        Euglycemia and Avoid Hyperthermia (PRN Acetaminophen )       Antihypertensives PRN if Blood pressure is greater than 220/120 or there is a concern for End organ damage/contraindications for permissive HTN. If blood pressure is greater than 220/120 give labetalol  PO or IV or Vasotec IV with a goal of 15% reduction in BP during the first 24 hours.       on lovenox -consider holding until MRI brain imaging completed to assess size of infarction to mitigate bleeding risks       aspirin  PR in meantime       MRI brain imaging       TTE       NPO until formal swallow eval performed       check lipid panel and HgA1C       lipitor 80mg        PT/OT/speech eval       fu with inpatient neurology  Sign Out:       Discussed with Emergency Department Provider    ------------------------------------------------------------------------------  Advanced Imaging: CTA Head and Neck Completed.  CTP Completed.  LVO:No  Patient is not a candidate for NIR   Metrics: Last Known Well: 05/11/2024 23:00:00 Arrival Time: 05/12/2024 07:31:00 Activation Time: 05/12/2024 07:35:05 Initial Response Time: 05/12/2024 07:38:17 Symptoms: right face swelling and droop. Initial patient interaction: 05/12/2024 07:50:17 NIHSS Assessment Completed: 05/12/2024 08:13:18 Patient is not a candidate for Thrombolytic. Thrombolytic Medical Decision:  05/12/2024 08:13:20 Patient was not deemed candidate for Thrombolytic because of following reasons: Heparin  received within 48 hours, resulting in abnormally elevated PTT>40 .  CT Head: I personally reviewed all the CT images that were available to me and it showed: 1. No acute intracranial abnormality, ASPECTS 10. 2. Stable since 2024 CT appearance of multifocal and bilateral chronic infarcts.   Primary Provider Notified of Diagnostic Impression and Management Plan on: 05/12/2024  08:18:34    ------------------------------------------------------------------------------  History of Present Illness: Patient is a 79 year old Male.  Patient was brought by EMS for symptoms of right face swelling and droop. 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 He presented with EMS, I spoke to his daughters Madelin and Glenys [phone] and [phone]). Glenys normally lives with him but yesterday stayed with her daughter. She came to give him a lovenox  shot at 6:45pm and he was normal at that time. She was unaware of what happened until this morning or how he got to the hospital. He has a difficult time communicating, does not follow commands. He has baseline homonymous hemianopsia on the right, diminished fine motor movements per EMR from April 2025. mRS 2 and NIHSS 2.   Past Medical History:      Hypertension      Hyperlipidemia      Coronary Artery Disease      Stroke      There is no history of Diabetes Mellitus      There is no history of Atrial Fibrillation  Medications:  Anticoagulant use:  Yes lovenox  No Antiplatelet use Reviewed EMR for current medications  Allergies:  Reviewed  Social History: Drug Use: Former  Family History:  There is no family history of premature cerebrovascular disease pertinent to this consultation  ROS : 14 Points Review of Systems was performed and was negative except mentioned in HPI.  Past Surgical History: There Is No Surgical History Contributory To Todays Visit    Examination: BP(160/110), Pulse(75), 1A: Level of Consciousness - Arouses to minor stimulation + 1 1B: Ask Month and Age - Both Questions Right + 0 1C: Blink Eyes & Squeeze Hands - Performs Both Tasks +  0 2: Test Horizontal Extraocular Movements - Normal + 0 3: Test Visual Fields - Complete Hemianopia + 2 4: Test Facial Palsy (Use Grimace if Obtunded) - Unilateral Complete paralysis (upper/lower face) + 3 5A: Test Left Arm Motor Drift - No Drift for 10 Seconds + 0 5B: Test Right Arm Motor Drift - No Drift for 10 Seconds + 0 6A: Test Left Leg Motor Drift - No Drift for 5 Seconds + 0 6B: Test Right Leg Motor Drift - Drift, but doesn't hit bed + 1 7: Test Limb Ataxia (FNF/Heel-Shin) - Ataxia in 1 Limb + 1 8: Test Sensation - Mild-Moderate Loss: Less Sharp/More Dull + 1 9: Test Language/Aphasia - Normal; No aphasia + 0 10: Test Dysarthria - Severe Dysarthria: Unintelligble Slurring or Out of Proportion to Aphasia + 2 11: Test Extinction/Inattention - No abnormality + 0  NIHSS Score: 11   Pre-Morbid Modified Rankin Scale: 2 Points = Slight disability; unable to carry out all previous activities, but able to look after own affairs without assistance  Spoke with : Dr. Zamitt I reviewed the available imaging via Rapid and  initiated discussion with the primary provider  This consult was conducted in real time using interactive audio and immunologist. Patient was informed of the technology being used for this visit and agreed to proceed. Patient located in hospital and provider located at home/office setting.   Patient is being evaluated for possible acute neurologic impairment and high probability of imminent or life-threatening deterioration. I spent total of 50 minutes providing care to this patient, including time for face to face visit via telemedicine, review of medical records, imaging studies and discussion of findings with providers, the patient and/or family.    Dr Lenox Mends   [facility] For Inpatient follow-up with [facility] physician please call [facility] at [phone]. As we are not an outpatient service for any post hospital discharge needs please contact the  hospital for assistance. If you have any questions for the [facility] physicians or need to reconsult for clinical or diagnostic changes please contact us  via [facility] at [phone].  Non-radiologist review of imaging performed to assist with emergent clinical decision-making. Remote physician workstations do not possess the same resolution, calibration, or diagnostic capabilities as hospital-based radiology reading stations, and formal radiologist read is necessary.   Signature : Lenox Lucion Dilger

## 2024-05-12 NOTE — ED Notes (Signed)
 Pt to CT 0735

## 2024-05-12 NOTE — ED Notes (Addendum)
 Central monitoring called and informed nurse that pt had a 6 beat of Vtach, assessed pt, pt denies any pain and was resting at time.  Dr. Evonnie made aware. Will continue to monitor.

## 2024-05-12 NOTE — Consult Note (Addendum)
 Clinical/Bedside Swallow Evaluation Patient Details  Name: [name] MRN: [id number] Date of Birth: June 04, 1945  Today's Date: 05/12/2024 Time: SLP Start Time (ACUTE ONLY): 1225 SLP Stop Time (ACUTE ONLY): 1245 SLP Time Calculation (min) (ACUTE ONLY): 20 min  Past Medical History:  Past Medical History:  Diagnosis Date   Apical mural thrombus    Arteriosclerotic cardiovascular disease (ASCVD)    a. s/p CABG in 2011 b. DES to RCA in 08/2017 c. cath in 06/2018 showing patent LIMA-LAD, SVG-OM1-OM2 and SVG-D1   Chronic anticoagulation    Chronic systolic CHF (congestive heart failure) (HCC)    a. EF 45-50% in 06/2012 b. 35-40% in 08/2017 with similar results in 06/2018 c. EF at 30-35% in 10/2020   CVA (cerebral vascular accident) (HCC)    HTN (hypertension)    Hyperlipidemia    Keloid    median sternotomy   Pulmonary embolism (HCC) 03/2010   Substance abuse (HCC)    formerly cocaine   Past Surgical History:  Past Surgical History:  Procedure Laterality Date   COLONOSCOPY N/A 01/22/2017   Procedure: COLONOSCOPY;  Surgeon: Harvey Margo LITTIE, MD;  Location: [facility];  Service: Endoscopy;  Laterality: N/A;  200   CORONARY ARTERY BYPASS GRAFT  03/18/2010   LIMA-LAD, SVG to diagonal, OM1 & OM2   CORONARY STENT INTERVENTION N/A 08/21/2017   Procedure: CORONARY STENT INTERVENTION;  Surgeon: Wonda Sharper, MD;  Location: [facility] INVASIVE CV LAB;  Service: Cardiovascular;  Laterality: N/A;   LEFT HEART CATH AND CORS/GRAFTS ANGIOGRAPHY N/A 08/21/2017   Procedure: LEFT HEART CATH AND CORS/GRAFTS ANGIOGRAPHY;  Surgeon: Wonda Sharper, MD;  Location: [facility] INVASIVE CV LAB;  Service: Cardiovascular;  Laterality: N/A;   LEFT HEART CATH AND CORS/GRAFTS ANGIOGRAPHY N/A 06/21/2018   Procedure: LEFT HEART CATH AND CORS/GRAFTS ANGIOGRAPHY;  Surgeon: Mady Bruckner, MD;  Location: [facility] INVASIVE CV LAB;  Service: Cardiovascular;  Laterality: N/A;   POLYPECTOMY  01/22/2017   Procedure: POLYPECTOMY;   Surgeon: Harvey Margo LITTIE, MD;  Location: [facility];  Service: Endoscopy;;  Transverse(CS) and sigmoid colon(HS)   PTCA  06/1996   LAD & RCA   TEE WITHOUT CARDIOVERSION N/A 06/10/2012   Procedure: TRANSESOPHAGEAL ECHOCARDIOGRAM (TEE);  Surgeon: Maude JAYSON Emmer, MD;  Location: [facility];  Service: Cardiovascular;  Laterality: N/A;   HPI:  [name] is a 79 year old male with a history of hypertension, coronary disease with history of CABG and MI, hyperlipidemia, hypertension, HFrEF (EF 35-40%), PE on Lovenox , stroke, and 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  Notably, the patient has failed both apixaban  and warfarin.  He is at recurrent PE and strokes on the prior anticoagulation.  He has been on Lovenox  1 mg/kg twice daily since 07/07/2022.  Unfortunately, the patient has only been giving himself Lovenox  once daily rather than the prescribed twice daily dosing.  Daughter states that he has been doing this for about 2 months.  Daughter states that he has been taking some other antiplatelet therapy and lieu of his second injection.  He has been compliant with all his other medications.MRI brain 2/5 indicated Acute left frontal cortical infarct  2. Old cortical infarcts in the left medial occipital lobe and right  inferior cerebellum  3. Old lacunar infarct in the left thalamus. ST consulted for clinical swallow evaluation. Pt currently NPO.    Assessment / Plan / Recommendation  Clinical Impression  Recommend initiating a conservative diet of Dysphagia 1(puree)/nectar-thickened  liquids with objective assessment to be completed prior to goal setting for ST. [name] was seen for a clinical swallow evaluation with oropharyngeal dysphagia noted.  OME revealing R decreased sensory awareness, R labial loss of saliva and impaired mastication on R with solids with solids eventually removed from R buccal cavity.  Slow, prolonged oral  transit and delay in the initiation of the swallow observed with all consistencies.  Immediate/delayed cough noted with thin via spoon/cup sips and nectar-thickened liquids with cup sips.  Volitional cough response weak and congested.  Pt able to follow oral directives with min-mod visual cueing provided by SLP. Family present for evaluation and educated re: dysphagia and swallow precautions to utilize prior to objective swallow evaluation which will be performed later this date.  Goals for ST will be set post MBSS administration.  Thank you for this consult. SLP Visit Diagnosis: Dysphagia, oropharyngeal phase (R13.12)    Aspiration Risk  Mild aspiration risk;Moderate aspiration risk    Diet Recommendation   Nectar;Dysphagia 1 (puree)  Medication Administration: Crushed with puree    Other Recommendations Oral Care Recommendations: Oral care BID     Swallow Evaluation Recommendations  Dysphagia 1/nectar-thickened liquids via tsp   Assistance Recommended at Discharge  FULL  Functional Status Assessment Patient has had a recent decline in their functional status and demonstrates the ability to make significant improvements in function in a reasonable and predictable amount of time.  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  Notably, the patient has failed both apixaban  and warfarin.  He is at recurrent PE and strokes on the prior anticoagulation.  He has been on Lovenox  1 mg/kg twice daily since 07/07/2022.  Unfortunately, the patient has only been giving himself Lovenox  once  daily rather than the prescribed twice daily dosing.  Daughter states that he has been doing this for about 2 months.  Daughter states that he has been taking some other antiplatelet therapy and lieu of his second injection.  He has been compliant with all his other medications.MRI brain 2/5 indicated Acute left frontal cortical infarct  2. Old cortical infarcts in the left medial occipital lobe and right  inferior cerebellum  3. Old lacunar infarct in the left thalamus. ST consulted for clinical swallow evaluation. Pt currently NPO. Type of Study: Bedside Swallow Evaluation Previous Swallow Assessment: n/a Diet Prior to this Study: NPO Temperature Spikes Noted: No Respiratory Status: Room air History of Recent Intubation: No Behavior/Cognition: Alert;Requires cueing Oral Cavity Assessment: Excessive secretions Oral Care Completed by SLP: Recent completion by staff Oral Cavity - Dentition: Missing dentition Self-Feeding Abilities: Needs assist Patient Positioning: Upright in bed Baseline Vocal Quality: Low vocal intensity;Wet Volitional Cough: Weak Volitional Swallow: Able to elicit    Oral/Motor/Sensory Function Overall Oral Motor/Sensory Function: Mild impairment Facial ROM: Reduced right Facial Symmetry: Abnormal symmetry right Facial Strength: Reduced right Facial Sensation: Reduced right Lingual Symmetry: Within Functional Limits Lingual Strength: Reduced   Ice Chips Ice chips: Impaired Presentation: Spoon Oral Phase Impairments: Impaired mastication;Reduced lingual movement/coordination Oral Phase Functional Implications: Oral holding Pharyngeal Phase  Impairments: Suspected delayed Swallow   Thin Liquid Thin Liquid: Impaired Presentation: Cup;Spoon Oral Phase Impairments: Reduced lingual movement/coordination Pharyngeal  Phase Impairments: Suspected delayed Swallow;Cough - Immediate;Cough - Delayed    Nectar Thick Nectar Thick Liquid: Impaired Presentation: Cup;Spoon Oral  phase functional implications: Prolonged oral transit Pharyngeal Phase Impairments: Suspected delayed Swallow;Cough - Delayed   Honey Thick Honey Thick Liquid: Not tested   Puree Puree: Impaired Presentation: Spoon Oral Phase Impairments: Reduced lingual movement/coordination Oral Phase Functional Implications: Prolonged oral transit Pharyngeal Phase Impairments: Suspected delayed Swallow   Solid     Solid: Impaired Presentation: Spoon Oral Phase Impairments: Impaired mastication;Reduced lingual movement/coordination Oral Phase Functional Implications: Right lateral sulci pocketing;Impaired mastication;Prolonged oral transit;Other (comment) (removed from oral cavity)      Pat Nicoya Friel,M.S.,CCC-SLP 05/12/2024,2:21 PM

## 2024-05-12 NOTE — Progress Notes (Signed)
 PHARMACIST - PHYSICIAN COMMUNICATION  CONCERNING:  Enoxaparin  (Lovenox ) for DVT Prophylaxis    RECOMMENDATION: Patient was prescribed enoxaprin 40mg  q24 hours for VTE prophylaxis.   Filed Weights   05/12/24 0738  Weight: 96.6 kg (212 lb 15.4 oz)    Body mass index is 34.37 kg/m.  Estimated Creatinine Clearance: 37.4 mL/min (A) (by C-G formula based on SCr of 1.8 mg/dL (H)).   Based on [facility] policy patient is candidate for enoxaparin  0.5mg /kg TBW SQ every 24 hours based on BMI being >30.  DESCRIPTION: Pharmacy has adjusted enoxaparin  dose per [facility] policy.  Patient is now receiving enoxaparin  50 mg every 24 hours    Annabella LOISE Banks, PharmD Clinical Pharmacist  05/12/2024 5:59 PM

## 2024-05-13 ENCOUNTER — Observation Stay (HOSPITAL_COMMUNITY)

## 2024-05-13 ENCOUNTER — Other Ambulatory Visit (HOSPITAL_COMMUNITY): Payer: Self-pay | Admitting: *Deleted

## 2024-05-13 LAB — ECHOCARDIOGRAM COMPLETE
Area-P 1/2: 3.42 cm2
Height: 66 in
S' Lateral: 3.9 cm
Weight: 3329.83 [oz_av]

## 2024-05-13 LAB — HEPARIN LEVEL (UNFRACTIONATED): Heparin Unfractionated: 0.28 [IU]/mL — ABNORMAL LOW (ref 0.30–0.70)

## 2024-05-13 LAB — LIPID PANEL
Cholesterol: 91 mg/dL (ref 0–200)
HDL: 46 mg/dL
LDL Cholesterol: 27 mg/dL (ref 0–99)
Total CHOL/HDL Ratio: 2 ratio
Triglycerides: 90 mg/dL
VLDL: 18 mg/dL (ref 0–40)

## 2024-05-13 MED ORDER — HEPARIN (PORCINE) 25000 UT/250ML-% IV SOLN
1000.0000 [IU]/h | INTRAVENOUS | Status: AC
  Administered 2024-05-13: 1000 [IU]/h via INTRAVENOUS
  Filled 2024-05-13: qty 250

## 2024-05-13 MED ORDER — ASPIRIN 81 MG PO CHEW
81.0000 mg | CHEWABLE_TABLET | Freq: Every day | ORAL | Status: AC
  Administered 2024-05-13: 81 mg via ORAL
  Filled 2024-05-13: qty 1

## 2024-05-13 MED ORDER — PERFLUTREN LIPID MICROSPHERE
1.0000 mL | INTRAVENOUS | Status: AC | PRN
  Administered 2024-05-13: 4 mL via INTRAVENOUS

## 2024-05-13 NOTE — Plan of Care (Signed)
" °  Problem: Education: Goal: Knowledge of General Education information will improve Description: Including pain rating scale, medication(s)/side effects and non-pharmacologic comfort measures 05/13/2024 0444 by Bethena Corean CROME, LPN Outcome: Progressing 05/13/2024 0443 by Bethena Corean CROME, LPN Outcome: Progressing   Problem: Health Behavior/Discharge Planning: Goal: Ability to manage health-related needs will improve 05/13/2024 0444 by Bethena Corean CROME, LPN Outcome: Progressing 05/13/2024 0443 by Bethena Corean CROME, LPN Outcome: Progressing   Problem: Clinical Measurements: Goal: Ability to maintain clinical measurements within normal limits will improve 05/13/2024 0444 by Bethena Corean CROME, LPN Outcome: Progressing 05/13/2024 0443 by Bethena Corean CROME, LPN Outcome: Progressing Goal: Will remain free from infection 05/13/2024 0444 by Bethena Corean CROME, LPN Outcome: Progressing 05/13/2024 0443 by Bethena Corean CROME, LPN Outcome: Progressing Goal: Diagnostic test results will improve 05/13/2024 0444 by Bethena Corean CROME, LPN Outcome: Progressing 05/13/2024 0443 by Bethena Corean CROME, LPN Outcome: Progressing Goal: Respiratory complications will improve 05/13/2024 0444 by Bethena Corean CROME, LPN Outcome: Progressing 05/13/2024 0443 by Bethena Corean CROME, LPN Outcome: Progressing Goal: Cardiovascular complication will be avoided 05/13/2024 0444 by Bethena Corean CROME, LPN Outcome: Progressing 05/13/2024 0443 by Bethena Corean CROME, LPN Outcome: Progressing   Problem: Activity: Goal: Risk for activity intolerance will decrease 05/13/2024 0444 by Bethena Corean CROME, LPN Outcome: Progressing 05/13/2024 0443 by Bethena Corean CROME, LPN Outcome: Progressing   Problem: Coping: Goal: Level of anxiety will decrease 05/13/2024 0444 by Bethena Corean CROME, LPN Outcome: Progressing 05/13/2024 0443 by Bethena Corean CROME, LPN Outcome: Progressing   Problem: Elimination: Goal: Will not experience  complications related to bowel motility 05/13/2024 0444 by Bethena Corean CROME, LPN Outcome: Progressing 05/13/2024 0443 by Bethena Corean CROME, LPN Outcome: Progressing Goal: Will not experience complications related to urinary retention 05/13/2024 0444 by Bethena Corean CROME, LPN Outcome: Progressing 05/13/2024 0443 by Bethena Corean CROME, LPN Outcome: Progressing   Problem: Safety: Goal: Ability to remain free from injury will improve 05/13/2024 0444 by Bethena Corean CROME, LPN Outcome: Progressing 05/13/2024 0443 by Bethena Corean CROME, LPN Outcome: Progressing   Problem: Skin Integrity: Goal: Risk for impaired skin integrity will decrease 05/13/2024 0444 by Bethena Corean CROME, LPN Outcome: Progressing 05/13/2024 0443 by Bethena Corean CROME, LPN Outcome: Progressing   Problem: Education: Goal: Knowledge of disease or condition will improve Outcome: Progressing Goal: Knowledge of secondary prevention will improve (MUST DOCUMENT ALL) Outcome: Progressing Goal: Knowledge of patient specific risk factors will improve (DELETE if not current risk factor) Outcome: Progressing   Problem: Ischemic Stroke/TIA Tissue Perfusion: Goal: Complications of ischemic stroke/TIA will be minimized Outcome: Progressing   Problem: Coping: Goal: Will verbalize positive feelings about self Outcome: Progressing Goal: Will identify appropriate support needs Outcome: Progressing   Problem: Health Behavior/Discharge Planning: Goal: Ability to manage health-related needs will improve Outcome: Progressing Goal: Goals will be collaboratively established with patient/family Outcome: Progressing   Problem: Self-Care: Goal: Ability to participate in self-care as condition permits will improve Outcome: Progressing Goal: Verbalization of feelings and concerns over difficulty with self-care will improve Outcome: Progressing Goal: Ability to communicate needs accurately will improve Outcome: Progressing   "

## 2024-05-13 NOTE — Progress Notes (Signed)
*  PRELIMINARY RESULTS* Echocardiogram 2D Echocardiogram has been performed with Definity .  [name] 05/13/2024, 9:41 AM

## 2024-05-13 NOTE — Evaluation (Signed)
 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  Notably, the patient has failed both apixaban  and warfarin.  He is at recurrent PE and strokes on the prior anticoagulation.  He has been on Lovenox  1 mg/kg twice daily since 07/07/2022.  Unfortunately, the patient has only been giving himself Lovenox  once daily rather than the prescribed twice daily dosing.  Daughter states that he has been doing this for about 2 months.  Daughter states that he has been taking some other antiplatelet therapy and lieu of his second injection.  He has been compliant with all his other medications.  Daughter gave him his Lovenox  injection on the evening of 05/11/2024 around 6:45 PM.  The patient woke up around 630A on the morning of 05/12/2024 with right facial droop and dysarthria and right sided weakness.  He denies any headache, chest pain, shortness breath, abdominal pain, nausea, vomiting, diarrhea.  There is no dysuria or hematuria.  He denies any dysesthesias.  He does have dysarthria.   Clinical Impression  Patient appeared agreeable to PT/OT co-evaluation. Patient exhibits some difficulty with expressive aphasia but is receptive and follows verbal commands without issue besides being somewhat HOH. Patient responds better with yes/no questioning. Per chart, patient ambulates without AD at baseline and is independent with ADLS. This date, patient requires min/CGA during bed mobility, and CGA during transfers and ambulation without AD. Patient demonstrates some mild strength, and coordination deficits with RLE but overall moving well  functionally. Pt returns to bed at [facility] and is left with staff to perform imaging. Pt reports having 24/7 support from family/daughter at home. Patient will benefit from continued skilled physical therapy acutely and in recommended venue in order to address current deficits and return to PLOF.        If plan is discharge home, recommend the following: A little help with walking and/or transfers;Assistance with cooking/housework;Assist for transportation;Help with stairs or ramp for entrance   Can travel by private vehicle        Equipment Recommendations None recommended by PT  Recommendations for Other Services       Functional Status Assessment Patient has had a recent decline in their functional status and demonstrates the ability to make significant improvements in function in a reasonable and predictable amount of time.     Precautions / Restrictions Precautions Precautions: Fall Recall of Precautions/Restrictions: Intact Restrictions Weight Bearing Restrictions Per Provider Order: No      Mobility  Bed Mobility Overal bed mobility: Needs Assistance Bed Mobility: Supine to Sit     Supine to sit: Contact guard, Min assist     General bed mobility comments: Inc time required for supine to sit due to labored movement, very light min assist at trunk when elevating from flat HOB    Transfers Overall transfer level: Needs assistance Equipment used: None Transfers: Sit to/from Stand, Bed to chair/wheelchair/BSC Sit to Stand: Contact guard assist   Step pivot transfers: Contact guard assist       General transfer comment: STS from bed and chair without AD, pt demo some unsteadiness initially that improves with duration of task, no AD use throughout, labored movement  Ambulation/Gait Ambulation/Gait assistance: Contact guard assist Gait Distance (Feet): 75 Feet Assistive device: None Gait Pattern/deviations: Step-through pattern, Decreased step length - right,  Decreased step length - left, Knee flexed in stance - left, Knee flexed in stance - right, Decreased stride length, Drifts right/left Gait velocity: dec     General Gait Details: pt ambulates in room and halls with the above mild deviations, no AD use, CGA for safety as pt demo mild unsteadiness but without any overt LOB, slightly labored movement  Stairs            Wheelchair Mobility     Tilt Bed    Modified Rankin (Stroke Patients Only)       Balance Overall balance assessment: Needs assistance Sitting-balance support: No upper extremity supported, Feet supported Sitting balance-Leahy Scale: Good Sitting balance - Comments: seated at EOB   Standing balance support: No upper extremity supported, During functional activity Standing balance-Leahy Scale: Fair Standing balance comment: without AD         Pertinent Vitals/Pain Pain Assessment Pain Assessment: Faces Faces Pain Scale: No hurt    Home Living Family/patient expects to be discharged to:: Private residence Living Arrangements: Children Available Help at Discharge: Family;Available 24 hours/day Type of Home: House Home Access: Level entry       Home Layout: One level Home Equipment: None Additional Comments: Mostly Per chart with limited patient confirmation due to expressive difficulties, pt confirms one of his daughters lives with him    Prior Function Prior Level of Function : Independent/Modified Independent             Mobility Comments: Per chart pt is independent for mobility. ADLs Comments: Per chart pt is independent and pt seemed to confirm this.     Extremity/Trunk Assessment   Upper Extremity Assessment Upper Extremity Assessment: Defer to OT evaluation RUE Deficits / Details: 4/5 shoulder flexion; 4+/5 shoulder abduction; 4+/5 elbow flexion and extension; 4-/5 wrist extension; 4+/5 wrist flexion; 4-/5 gross grasp. Decreased fine and gross motor skills. RUE Sensation: decreased  light touch RUE Coordination: decreased fine motor;decreased gross motor    Lower Extremity Assessment Lower Extremity Assessment: Generalized weakness;RLE deficits/detail (RLE demo slightly decreased strength as compared to L at 4-/5 grossly. Impaired heel to shin coordination with RLE. Sensation equal in BLE) RLE Deficits / Details: RLE demo slightly decreased strength as compared to L at 4-/5 grossly. Impaired heel to shin coordination with RLE. Sensation equal in BLE RLE Sensation: WNL RLE Coordination: decreased gross motor    Cervical / Trunk Assessment Cervical / Trunk Assessment: Normal  Communication   Communication Communication: Impaired Factors Affecting Communication: Reduced clarity of speech;Difficulty expressing self    Cognition Arousal: Alert Behavior During Therapy: WFL for tasks assessed/performed           Following commands: Intact       Cueing Cueing Techniques: Verbal cues, Gestural cues, Tactile cues, Visual cues     General Comments      Exercises     Assessment/Plan    PT Assessment Patient needs continued PT services;All further PT needs can be met in the next venue of care  PT Problem List Decreased strength;Decreased range of motion;Decreased activity tolerance;Decreased balance;Decreased mobility       PT Treatment Interventions DME instruction;Balance training;Gait training;Functional mobility training;Therapeutic activities;Therapeutic exercise;Patient/family education    PT Goals (Current goals can be found in the Care Plan section)  Acute Rehab PT Goals Patient Stated Goal: Return home PT Goal Formulation: With  patient Time For Goal Achievement: 05/20/24 Potential to Achieve Goals: Good    Frequency Min 4X/week     Co-evaluation PT/OT/SLP Co-Evaluation/Treatment: Yes Reason for Co-Treatment: To address functional/ADL transfers PT goals addressed during session: Mobility/safety with mobility OT goals addressed during  session: ADL's and self-care       AM-PAC PT 6 Clicks Mobility  Outcome Measure Help needed turning from your back to your side while in a flat bed without using bedrails?: None Help needed moving from lying on your back to sitting on the side of a flat bed without using bedrails?: A Little Help needed moving to and from a bed to a chair (including a wheelchair)?: A Little Help needed standing up from a chair using your arms (e.g., wheelchair or bedside chair)?: None Help needed to walk in hospital room?: A Little Help needed climbing 3-5 steps with a railing? : A Little 6 Click Score: 20    End of Session Equipment Utilized During Treatment: Gait belt Activity Tolerance: Patient tolerated treatment well Patient left: in bed;with call bell/phone within reach;Other (comment) (pt left in bed with ECHO staff) Nurse Communication: Mobility status PT Visit Diagnosis: Unsteadiness on feet (R26.81);Other abnormalities of gait and mobility (R26.89);Muscle weakness (generalized) (M62.81);Hemiplegia and hemiparesis Hemiplegia - Right/Left: Right Hemiplegia - caused by: Cerebral infarction    Time: 9184-9158 PT Time Calculation (min) (ACUTE ONLY): 26 min   Charges:   PT Evaluation $PT Eval Moderate Complexity: 1 Mod   PT General Charges $$ ACUTE PT VISIT: 1 Visit        12:50 PM, 05/13/24 Briscoe Daniello Powell-Butler, PT, DPT [place] with [facility]

## 2024-05-13 NOTE — Consult Note (Addendum)
 I connected with  [name] on 05/13/24 by a video enabled telemedicine application and verified that I am speaking with the correct person using two identifiers.   I discussed the limitations of evaluation and management by telemedicine. The patient expressed understanding and agreed to proceed.  Location of patient: [facility] Location of physician: [facility]   Neurology Consultation Reason for Consult: stroke Referring Physician: Dr. Alm Tat  CC: right weakness  History is obtained from: Patient, daughter at bedside, chart review  HPI: [name] is a 79 y.o. 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  Per daughter at bedside, she last saw him normal on Tuesday evening around 2300.  Then on Wednesday morning at around 6 AM he noticed right-sided weakness when he woke up.  He was brought to the emergency room.  Of note patient reports taking Lovenox  shots only once in the evening on most days because it causes knots in his stomach  Last known normal: 05/11/2024 at 11 PM No tPA or thrombectomy as outside window and no large cell occlusion Event happened at home mRS 2  ROS: All other systems reviewed and negative except as noted in the HPI.   Past Medical History:  Diagnosis Date   Apical mural thrombus    Arteriosclerotic cardiovascular disease (ASCVD)    a. s/p CABG in 2011 b. DES to RCA in 08/2017 c. cath in 06/2018 showing patent LIMA-LAD, SVG-OM1-OM2 and SVG-D1   Chronic anticoagulation    Chronic systolic CHF (congestive heart failure) (HCC)    a. EF 45-50% in 06/2012 b. 35-40% in 08/2017 with similar results in 06/2018 c. EF at 30-35% in 10/2020   CVA (cerebral vascular accident) (HCC)    HTN (hypertension)    Hyperlipidemia    Keloid    median sternotomy   Pulmonary  embolism (HCC) 03/2010   Substance abuse (HCC)    formerly cocaine    Family History  Problem Relation Age of Onset   Cancer Mother 46   Stroke Daughter     Social History:  reports that he quit smoking about 22 years ago. His smoking use included cigarettes. He has never used smokeless tobacco. He reports that he does not currently use alcohol . He reports that he does not use drugs.   Medications Prior to Admission  Medication Sig Dispense Refill Last Dose/Taking   atorvastatin  (LIPITOR) 40 MG tablet Take 40 mg by mouth daily.   05/11/2024 Morning   Cal Carb-Mag Hydrox-Simeth (ROLAIDS ADVANCED) 1000-200-40 MG CHEW Chew 1-2 tablets by mouth daily as needed (heartburn).   Unknown   clopidogrel  (PLAVIX ) 75 MG tablet Take 75 mg by mouth daily.   05/11/2024 at  6:30 AM   cyclobenzaprine  (FLEXERIL ) 5 MG tablet Take 5 mg by mouth 2 (two) times daily as needed.   Unknown   diclofenac  Sodium (VOLTAREN ) 1 % GEL Apply 2 g topically 4 (four) times daily. 150 g 0 Unknown   empagliflozin  (JARDIANCE ) 10 MG TABS tablet TAKE 1 TABLET BY MOUTH EVERY DAY BEFORE BREAKFAST 90 tablet 1 05/11/2024 Morning   enoxaparin  (LOVENOX ) 100 MG/ML injection INJECT 0.95 MLS (95 MG TOTAL) INTO THE SKIN EVERY 12 (TWELVE) HOURS. 30 mL 6 05/11/2024 at  6:45 PM   metoprolol  succinate (TOPROL -XL) 50 MG 24 hr tablet Take 50 mg by mouth daily.   05/11/2024  Morning   nitroGLYCERIN  (NITROSTAT ) 0.4 MG SL tablet Place 1 tablet (0.4 mg total) under the tongue every 5 (five) minutes as needed for chest pain. up to 3 doses. 25 tablet 4 Unknown   spironolactone  (ALDACTONE ) 25 MG tablet TAKE 1 TABLET (25 MG TOTAL) BY MOUTH DAILY. 90 tablet 2 05/11/2024 Morning      Exam: Current vital signs: BP (!) 168/101 (BP Location: Right Arm)   Pulse 85   Temp 98.5 F (36.9 C) (Oral)   Resp 18   Ht 5' 6 (1.676 m)   Wt 94.4 kg   SpO2 93%   BMI 33.59 kg/m  Vital signs in last 24 hours: Temp:  [97.6 F (36.4 C)-98.6 F (37 C)] 98.5 F (36.9 C)  (02/06 0759) Pulse Rate:  [69-90] 85 (02/06 0759) Resp:  [10-20] 18 (02/06 0622) BP: (133-206)/(69-101) 168/101 (02/06 0759) SpO2:  [90 %-97 %] 93 % (02/06 0759) Weight:  [94.4 kg] 94.4 kg (02/05 1835)   Physical Exam  Constitutional: Appears well-developed and well-nourished.  Psych: Affect appropriate to situation Neuro: AO x 3, no aphasia, severe dysarthria, right facial droop, decreased sensation on right side, antigravity send in all 4 extremities without drift, FTN intact bilaterally  NIHSS 6  1A: Level of consciousness --> 0 = Alert; keenly responsive 1B: Ask month and age --> 0 = Both questions right 1C: 'Blink eyes' & 'squeeze hands' --> 0 = Performs both tasks 2: Horizontal extraocular movements --> 0 = Normal 3: Visual fields --> 0 = No visual loss 4: Facial palsy --> 3 = Unilateral complete paralysis (upper/lower face) 5A: Left arm motor drift --> 0 = No drift for 10 seconds 5B: Right arm motor drift --> 0 = No drift for 10 seconds 6A: Left leg motor drift --> 0 = No drift for 5 seconds 6B: Right leg motor drift --> 0 = No drift for 5 seconds 7: Limb Ataxia --> 0 = No ataxia 8: Sensation --> 1 = Mild-moderate loss: less sharp/more dull 9: Language/aphasia --> 0 = Normal; no aphasia 10: Dysarthria --> 2 = Severe dysarthria: unintelligible slurring or out of proportion to dysphasia 11: Extinction/inattention --> 0 = No abnormality   I have reviewed labs in epic and the results pertinent to this consultation are: CBC:  Recent Labs  Lab 05/12/24 0743 05/12/24 0750  WBC 6.8  --   NEUTROABS 2.4  --   HGB 17.2* 19.0*  HCT 54.3* 56.0*  MCV 92.0  --   PLT 158  --     Basic Metabolic Panel:  Lab Results  Component Value Date   NA 139 05/12/2024   K 3.6 05/12/2024   CO2 23 05/12/2024   GLUCOSE 155 (H) 05/12/2024   BUN 18 05/12/2024   CREATININE 1.80 (H) 05/12/2024   CALCIUM  9.2 05/12/2024   GFRNONAA 44 (L) 05/12/2024   GFRAA >60 10/10/2019   Lipid Panel:   Lab Results  Component Value Date   LDLCALC 27 05/13/2024   HgbA1c:  Lab Results  Component Value Date   HGBA1C 5.8 (H) 05/12/2024   Urine Drug Screen:     Component Value Date/Time   LABOPIA NEGATIVE 05/12/2024 0825   COCAINSCRNUR NEGATIVE 05/12/2024 0825   LABBENZ NEGATIVE 05/12/2024 0825   AMPHETMU NEGATIVE 05/12/2024 0825   THCU NEGATIVE 05/12/2024 0825   LABBARB NEGATIVE 05/12/2024 0825    Alcohol  Level     Component Value Date/Time   ETH <15 05/12/2024 0743     I have reviewed  the images obtained:  CT Head without contrast and CTP 05/12/2024: No acute intracranial abnormality, ASPECTS 10.  Stable since 2024 CT appearance of multifocal and bilateral chronic infarcts.   CT angio Head and Neck with contrast 05/12/2024: CTP is positive but CTA is negative for large vessel occlusion. Moderate to severe right vertebral artery origin atherosclerotic stenosis is progressed since 2024. ICA siphon calcified atherosclerosis is progressed, but no other hemodynamically significant stenosis. CT Perfusion does detect a small volume of infarct with surrounding oligemia in the left MCA territory (Tmax 17 mL, CBF 10 mL).  MRI Brain wo contrast 05/12/2024: Acute left frontal cortical infarct Old cortical infarcts in the left medial occipital lobe and right inferior cerebellum. Old lacunar infarct in the left thalamus.    ASSESSMENT/PLAN: 79 year old male with history of PE on Lovenox  but only taking once daily instead of recommended twice daily presented with right-sided weakness and MRI brain showed left frontal stroke.  Acute ischemic stroke - Etiology: Likely due to hypercoagulable state  Recommendations: - Discussed with Dr. Jerri. Given patient's history, would recommend continue with non bolus low-dose heparin  protocol over the weekend - If patient remains stable over the weekend then on Monday can transition back to full dose Lovenox  -While on heparin  drip, if patient has any change  in neurologic exam, please obtain stat CT head and call neurology -Continue atorvastatin  40 mg daily, LDL 27 -Goal blood pressure: Gradual normotension -TTE pending - PT/OT/speech therapy/swallow eval - Follow-up with neurology and 4 to 6 weeks (order placed) - Discussed plan with patient and daughter at bedside - Discussed plan with Dr. Evonnie via secure chat    Thank you for allowing us  to participate in the care of this patient. If you have any further questions, please contact  me or neurohospitalist.   Arlin Krebs Epilepsy [facility]

## 2024-05-13 NOTE — Progress Notes (Signed)
 Transition of Care Department [facility]) has reviewed patient and potential TOC needs have been identified for possible DME, in addition to OPPT / OT per Therapy recommendations.  Patient dysarthric and very slow to respond, will attempt to contact Daughter again in the morning  to review aforementioned.  Inpatient Case Manager will continue to monitor patient advancement through interdisciplinary progression rounds. If any additional patient transition needs arise, please place a TOC consult to prompt ICM follow-up.    05/13/24 1745  TOC Brief Assessment  Insurance and Status Reviewed  Patient has primary care physician Yes  Home environment has been reviewed Home with Daughter  Prior level of function: Independent  Prior/Current Home Services No current home services  Social Drivers of Health Review SDOH reviewed no interventions necessary  Readmission risk has been reviewed Yes  Transition of care needs transition of care needs identified, TOC will continue to follow

## 2024-05-13 NOTE — Progress Notes (Signed)
 PHARMACY - ANTICOAGULATION CONSULT NOTE  Pharmacy Consult for heparin  Indication: hypercoagulablestate, history of LV thrombus on lovenox  prior to admit.   Allergies[1]  Patient Measurements: Height: 5' 6 (167.6 cm) Weight: 94.4 kg (208 lb 1.8 oz) IBW/kg (Calculated) : 63.8 HEPARIN  DW (KG): 84.1  Vital Signs: Temp: 97.7 F (36.5 C) (02/06 1256) Temp Source: Oral (02/06 1256) BP: 163/83 (02/06 1256) Pulse Rate: 90 (02/06 1256)  Labs: Recent Labs    05/12/24 0743 05/12/24 0750  HGB 17.2* 19.0*  HCT 54.3* 56.0*  PLT 158  --   APTT 30  --   LABPROT 13.7  --   INR 1.0  --   CREATININE 1.59* 1.80*    Estimated Creatinine Clearance: 36.9 mL/min (A) (by C-G formula based on SCr of 1.8 mg/dL (H)).   Medical History: Past Medical History:  Diagnosis Date   Apical mural thrombus    Arteriosclerotic cardiovascular disease (ASCVD)    a. s/p CABG in 2011 b. DES to RCA in 08/2017 c. cath in 06/2018 showing patent LIMA-LAD, SVG-OM1-OM2 and SVG-D1   Chronic anticoagulation    Chronic systolic CHF (congestive heart failure) (HCC)    a. EF 45-50% in 06/2012 b. 35-40% in 08/2017 with similar results in 06/2018 c. EF at 30-35% in 10/2020   CVA (cerebral vascular accident) (HCC)    HTN (hypertension)    Hyperlipidemia    Keloid    median sternotomy   Pulmonary embolism (HCC) 03/2010   Substance abuse (HCC)    formerly cocaine    Assessment: 79 year old with known history of LV thrombus and hypercoagulable state on lovenox  prior to admit after failing coumadin  and apixaban . Concern for new CVA, orders from neurology to start low dose heparin  without bolus. Lovenox  50mg  given last night ~2000.   Goal of Therapy:  Heparin  level 0.3-0.5 units/ml Monitor platelets by anticoagulation protocol: Yes   Plan:  Start heparin  infusion at 1000 units/hr Check anti-Xa level in 8 hours and daily while on heparin  Continue to monitor H&H and platelets  Dempsey Blush PharmD.,  BCPS Clinical Pharmacist 05/13/2024 1:36 PM     [1]  Allergies Allergen Reactions   Benadryl [Diphenhydramine]    Lisinopril Swelling and Other (See Comments)    Mouth and tongue swells   Penicillins

## 2024-05-13 NOTE — Progress Notes (Signed)
 "          PROGRESS NOTE  [name] FMW:[id number] DOB: 1945/04/21 DOA: 05/12/2024 PCP: Carlette Benita Area, MD  Brief History:  79 year old male with a history of hypertension, coronary disease with history of CABG and MI, hyperlipidemia, hypertension, HFrEF (EF 35-40%), PE on Lovenox , stroke, and 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 Notably, the patient has failed both apixaban  and warfarin.  He is at recurrent PE and strokes on the prior anticoagulation.  He has been on Lovenox  1 mg/kg twice daily since 07/07/2022. Unfortunately, the patient has only been giving himself Lovenox  once daily rather than the prescribed twice daily dosing.  Daughter states that he has been doing this for about 2 months.  Daughter states that he has been taking some other antiplatelet therapy and lieu of his second injection. He has been compliant with all his other medications. Daughter gave him his Lovenox  injection on the evening of 05/11/2024 around 6:45 PM.  The patient woke up around 630A on the morning of 05/12/2024 with right facial droop and dysarthria and right sided weakness. He denies any headache, chest pain, shortness breath, abdominal pain, nausea, vomiting, diarrhea.  There is no dysuria or hematuria.  He denies any dysesthesias.  He does have dysarthria.  In the ED, the patient was afebrile and hemodynamically stable with oxygen  saturation 98% on room air.  WBC 6.8, hemoglobin 17.2, platelets 158.  Sodium 141, potassium 3.4, bicarbonate 23, serum creatinine 1.59.  AST 29, ALT 40, alk phosphatase 59, total bilirubin 1.6.  EKG showed sinus rhythm with no ST just T wave changes.  CTA head and neck was negative for LVO.  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  CT perfusion showed a small volume infarct with surrounding oligemia in the left MCA territory.  CT brain was negative for acute findings.  There is chronic  bilateral infarcts. The patient was seen by teleneurology who recommended holding his Lovenox  pending MRI to evaluate the size of his stroke.  They recommended admission for further workup.  Pt was given rectal ASA in ED.   Assessment/Plan: Acute ischemic stroke -Appreciate Neurology Consult -PT/OT evaluation -Speech therapy eval -CT brain--neg for acute finding -MRI brain--acute left frontal infarct -CTA H&N--negative LVO.   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  CT perfusion showed a small volume infarct with surrounding oligemia in the left MCA territory.  -2/6 Echo-- EF 40-45%, +LV thrombus, G1DD -LDL--27 -HbA1C--5.8 -Antiplatelet--ASA PR initially>>IV heparin  x 72 hours, then back to Lovenox  [place] - discussed with Dr. Shelton   Chronic HFpEF -Patient is clinically euvolemic -Continue home doses of hydralazine , Imdur , Jardiance , -Holding spironolactone , metoprolol  succinate to allow for permissive hypertension -08/28/2021 echo EF 35-40%, no LV thrombus, normal RV function, +WMA - 10/02/2023 echo EF 35-40%, grade 1 DD, akinetic apex, small apical thrombus -05/13/24 Echo-- EF 40-45%, +LV thrombus, G1DD   LV thrombus History/ History of PE - Has failed warfarin and apixaban  - Was supposed to be taking Lovenox  twice daily at home   CKD stage IIIa -Baseline creatinine 1.5-1.7   Mixed hyperlipidemia -Continue statin   Coronary artery disease -holding plavix  while on IV heparin  - No chest pain presently   Essential hypertension - Holding metoprolol , Imdur , hydralazine  to allow for permissive hypertension         Family Communication:   daughter at bedside  Consultants:  neruology  Code Status:  FULL  DVT Prophylaxis:  IV Heparin     Procedures: As Listed in Progress Note Above  Antibiotics: None       Subjective: Patient denies fevers, chills, headache, chest pain, dyspnea, nausea, vomiting, diarrhea, abdominal pain, dysuria, hematuria,  hematochezia, and melena. Still has dysarthria, about same.  R side weakness about same.    Objective: Vitals:   05/13/24 0100 05/13/24 0622 05/13/24 0759 05/13/24 1256  BP: (!) 159/74 (!) 175/94 (!) 168/101 (!) 163/83  Pulse: 83 90 85 90  Resp:  18    Temp: 98 F (36.7 C) 97.6 F (36.4 C) 98.5 F (36.9 C) 97.7 F (36.5 C)  TempSrc: Oral Oral Oral Oral  SpO2: 90% 90% 93% 96%  Weight:      Height:        Intake/Output Summary (Last 24 hours) at 05/13/2024 1730 Last data filed at 05/13/2024 0404 Gross per 24 hour  Intake 368.61 ml  Output 460 ml  Net -91.39 ml   Weight change:  Exam:  General:  Pt is alert, follows commands appropriately, not in acute distress HEENT: No icterus, No thrush, No neck mass, [place]/AT Cardiovascular: RRR, S1/S2, no rubs, no gallops Respiratory: bibasilar rales. No wheeze Abdomen: Soft/+BS, non tender, non distended, no guarding Extremities: No edema, No lymphangitis, No petechiae, No rashes, no synovitis Neuro:  CN II-XII intact, strength 4-/5 in RUE, 4/5 RLE, strength 4/5 LUE, LLE; sensation intact bilateral; no dysmetria; babinski equivocal    Data Reviewed: I have personally reviewed following labs and imaging studies Basic Metabolic Panel: Recent Labs  Lab 05/12/24 0743 05/12/24 0750  NA 141 139  K 3.4* 3.6  CL 102 104  CO2 23  --   GLUCOSE 155* 155*  BUN 15 18  CREATININE 1.59* 1.80*  CALCIUM  9.2  --    Liver Function Tests: Recent Labs  Lab 05/12/24 0743  AST 29  ALT 48*  ALKPHOS 59  BILITOT 1.6*  PROT 8.0  ALBUMIN 4.6   No results for input(s): LIPASE, AMYLASE in the last 168 hours. No results for input(s): AMMONIA in the last 168 hours. Coagulation Profile: Recent Labs  Lab 05/12/24 0743  INR 1.0   CBC: Recent Labs  Lab 05/12/24 0743 05/12/24 0750  WBC 6.8  --   NEUTROABS 2.4  --   HGB 17.2* 19.0*  HCT 54.3* 56.0*  MCV 92.0  --   PLT 158  --    Cardiac Enzymes: No results for input(s): CKTOTAL,  CKMB, CKMBINDEX, TROPONINI in the last 168 hours. BNP: Invalid input(s): POCBNP CBG: Recent Labs  Lab 05/12/24 0733  GLUCAP 132*   HbA1C: Recent Labs    05/12/24 0743  HGBA1C 5.8*   Urine analysis:    Component Value Date/Time   COLORURINE STRAW (A) 01/26/2023 1047   APPEARANCEUR CLEAR 01/26/2023 1047   LABSPEC 1.009 01/26/2023 1047   PHURINE 5.0 01/26/2023 1047   GLUCOSEU >=500 (A) 01/26/2023 1047   HGBUR NEGATIVE 01/26/2023 1047   BILIRUBINUR NEGATIVE 01/26/2023 1047   KETONESUR NEGATIVE 01/26/2023 1047   PROTEINUR NEGATIVE 01/26/2023 1047   UROBILINOGEN 1.0 03/16/2010 0652   NITRITE NEGATIVE 01/26/2023 1047   LEUKOCYTESUR NEGATIVE 01/26/2023 1047   Sepsis Labs: @LABRCNTIP (procalcitonin:4,lacticidven:4) )No results found for this or any previous visit (from the past 240 hours).   Scheduled Meds:  aspirin   81 mg Oral Daily   atorvastatin   40 mg Oral QPM   empagliflozin   10 mg Oral Daily   Continuous Infusions:  sodium chloride  Stopped (05/13/24 1437)  heparin  1,000 Units/hr (05/13/24 1434)    Procedures/Studies:   Alm Schneider, DO  [facility]  If 7PM-7AM, please contact night-coverage [email or web address] Password [email or web address] 05/13/2024, 5:30 PM   LOS: 0 days   "

## 2024-05-13 NOTE — Plan of Care (Signed)
" °  Problem: Acute Rehab OT Goals (only OT should resolve) Goal: Pt. Will Perform Grooming Flowsheets (Taken 05/13/2024 1130) Pt Will Perform Grooming: with modified independence Goal: Pt. Will Perform Upper Body Dressing Flowsheets (Taken 05/13/2024 1130) Pt Will Perform Upper Body Dressing: with modified independence Goal: Pt. Will Perform Lower Body Dressing Flowsheets (Taken 05/13/2024 1130) Pt Will Perform Lower Body Dressing: with modified independence Goal: Pt. Will Transfer To Toilet Flowsheets (Taken 05/13/2024 1130) Pt Will Transfer to Toilet:  with modified independence  ambulating Goal: Pt. Will Perform Toileting-Clothing Manipulation Flowsheets (Taken 05/13/2024 1130) Pt Will Perform Toileting - Clothing Manipulation and hygiene: with modified independence Goal: Pt/Caregiver Will Perform Home Exercise Program Flowsheets (Taken 05/13/2024 1130) Pt/caregiver will Perform Home Exercise Program:  Increased ROM  Increased strength and coordination  Independently  Shylo Dillenbeck OT, MOT  "

## 2024-05-13 NOTE — Evaluation (Addendum)
 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  Notably, the patient has failed both apixaban  and warfarin.  He is at recurrent PE and strokes on the prior anticoagulation.  He has been on Lovenox  1 mg/kg twice daily since 07/07/2022.  Unfortunately, the patient has only been giving himself Lovenox  once daily rather than the prescribed twice daily dosing.  Daughter states that he has been doing this for about 2 months.  Daughter states that he has been taking some other antiplatelet therapy and lieu of his second injection.  He has been compliant with all his other medications.  Daughter gave him his Lovenox  injection on the evening of 05/11/2024 around 6:45 PM.  The patient woke up around 630A on the morning of 05/12/2024 with right facial droop and dysarthria and right sided weakness.  He denies any headache, chest pain, shortness breath, abdominal pain, nausea, vomiting, diarrhea.  There is no dysuria or hematuria.  He denies any dysesthesias.  He does have dysarthria. (per MD)     Clinical Impressions Pt agreeable to OT and PT co-evaluation. Pt having expressive difficulties which he seemed to report were new. It was difficult to gather prior living information, but much of it was in the chart from a previous admission. Pt demonstrates weakness in R UE with deficits in coordination and sensation. Mild A/ROM deficit of shoulder as well when initially tested. Pt's R visual field is also impaired but this may be a baseline issue. Pt required CGA for mobility without AD within the room and hall. Pt at level of  set up to min A for most ADL's per observation and clinical judgement. Pt reports his family can support him 24/7 at home. Pt left in the bed with staff entering the room. Pt will benefit from continued OT in the hospital to increase strength, balance, and endurance for safe ADL's.        If plan is discharge home, recommend the following:   A little help with walking and/or transfers;A little help with bathing/dressing/bathroom;Assistance with cooking/housework;Assist for transportation;Help with stairs or ramp for entrance     Functional Status Assessment   Patient has had a recent decline in their functional status and demonstrates the ability to make significant improvements in function in a reasonable and predictable amount of time.     Equipment Recommendations   None recommended by OT             Precautions/Restrictions   Precautions Precautions: Fall Recall of Precautions/Restrictions: Intact Restrictions Weight Bearing Restrictions Per Provider Order: No     Mobility Bed Mobility Overal bed mobility: Needs Assistance Bed Mobility: Supine to Sit     Supine to sit: Contact guard          Transfers Overall transfer level: Needs assistance   Transfers: Sit to/from Stand, Bed to chair/wheelchair/BSC Sit to Stand: Contact guard assist     Step pivot transfers: Contact guard assist     General transfer comment: EOB to chair without AD      Balance Overall balance assessment: Needs assistance Sitting-balance support: No upper extremity supported, Feet supported Sitting balance-Leahy Scale: Good Sitting  balance - Comments: seated at EOB   Standing balance support: No upper extremity supported, During functional activity Standing balance-Leahy Scale: Fair Standing balance comment: without AD                           ADL either performed or assessed with clinical judgement   ADL Overall ADL's : Needs  assistance/impaired Eating/Feeding: Set up;Sitting   Grooming: Minimal assistance;Sitting   Upper Body Bathing: Set up;Minimal assistance;Sitting   Lower Body Bathing: Minimal assistance;Sitting/lateral leans   Upper Body Dressing : Set up;Sitting;Minimal assistance   Lower Body Dressing: Minimal assistance;Sitting/lateral leans   Toilet Transfer: Contact guard assist;Minimal assistance;Squat-pivot;Ambulation   Toileting- Clothing Manipulation and Hygiene: Minimal assistance;Sitting/lateral lean;Sit to/from stand       Functional mobility during ADLs: Contact guard assist;Minimal assistance       Vision Baseline Vision/History: 1 Wears glasses Ability to See in Adequate Light: 2 Moderately impaired Patient Visual Report: Other (comment) (Pt seemed to report R field visual deficits at baseline.) Vision Assessment?: Yes Ocular Range of Motion: Other (comment) (Limited in R field.) Tracking/Visual Pursuits: Other (comment) (Poor tracking in R visual field. Delayed midline and L field.) Convergence: Impaired (comment) (no convergence observed when attempted) Visual Fields: Right visual field deficit     Perception Perception: Not tested       Praxis Praxis: Not tested       Pertinent Vitals/Pain Pain Assessment Pain Assessment: Faces Faces Pain Scale: No hurt     Extremity/Trunk Assessment Upper Extremity Assessment Upper Extremity Assessment: RUE deficits/detail RUE Deficits / Details: 4/5 shoulder flexion; 4+/5 shoulder abduction; 4+/5 elbow flexion and extension; 4-/5 wrist extension; 4+/5 wrist flexion; 4-/5 gross grasp. Decreased fine and gross motor skills. RUE Sensation: decreased light touch RUE Coordination: decreased fine motor;decreased gross motor   Lower Extremity Assessment Lower Extremity Assessment: Defer to PT evaluation   Cervical / Trunk Assessment Cervical / Trunk Assessment: Normal   Communication Communication Communication:  Impaired Factors Affecting Communication: Reduced clarity of speech;Difficulty expressing self   Cognition Arousal: Alert Behavior During Therapy: WFL for tasks assessed/performed Cognition: No apparent impairments             OT - Cognition Comments: Communication seems for expressive difficulties than cognitive deficits.                 Following commands: Intact       Cueing  General Comments   Cueing Techniques: Verbal cues;Gestural cues;Tactile cues                 Home Living Family/patient expects to be discharged to:: Private residence Living Arrangements: Children Available Help at Discharge: Family;Available 24 hours/day Type of Home: House Home Access: Level entry     Home Layout: One level     Bathroom Shower/Tub: Chief Strategy Officer: Standard Bathroom Accessibility: Yes How Accessible: Accessible via wheelchair;Accessible via [place] Home Equipment: None   Additional Comments: Per chart with limited patient confirmation due to expressive difficulties.      Prior Functioning/Environment Prior Level of Function : Independent/Modified Independent             Mobility Comments: Per chart pt is independent for mobility. ADLs Comments: Per chart pt is independent and pt seemed to confirm this.    OT Problem List: Decreased strength;Decreased range of motion;Decreased activity tolerance;Impaired balance (sitting and/or standing);Decreased coordination;Impaired UE functional use;Impaired sensation   OT Treatment/Interventions: Self-care/ADL training;Therapeutic exercise;DME  and/or AE instruction;Neuromuscular education;Therapeutic activities;Visual/perceptual remediation/compensation;Patient/family education;Balance training      OT Goals(Current goals can be found in the care plan section)   Acute Rehab OT Goals Patient Stated Goal: Return home. OT Goal Formulation: With patient Time For Goal Achievement:  05/27/24 Potential to Achieve Goals: Good   OT Frequency:  Min 2X/week    Co-evaluation PT/OT/SLP Co-Evaluation/Treatment: Yes Reason for Co-Treatment: To address functional/ADL transfers   OT goals addressed during session: ADL's and self-care                       End of Session Equipment Utilized During Treatment: Gait belt  Activity Tolerance: Patient tolerated treatment well Patient left: in bed;with call bell/phone within reach  OT Visit Diagnosis: Unsteadiness on feet (R26.81);Other abnormalities of gait and mobility (R26.89);Muscle weakness (generalized) (M62.81);Cognitive communication deficit (R41.841);Other symptoms and signs involving the nervous system (R29.898);Hemiplegia and hemiparesis Symptoms and signs involving cognitive functions: Cerebral infarction Hemiplegia - Right/Left: Right Hemiplegia - caused by: Cerebral infarction                Time: 9184-9158 OT Time Calculation (min): 26 min Charges:  OT General Charges $OT Visit: 1 Visit OT Evaluation $OT Eval Low Complexity: 1 Low  [place] OT, MOT  [employer] 05/13/2024, 11:26 AM

## 2024-05-13 NOTE — Plan of Care (Signed)
" °  Problem: Acute Rehab PT Goals(only PT should resolve) Goal: Pt Will Go Supine/Side To Sit Outcome: Progressing Flowsheets (Taken 05/13/2024 1251) Pt will go Supine/Side to Sit: with supervision Goal: Patient Will Transfer Sit To/From Stand Outcome: Progressing Flowsheets (Taken 05/13/2024 1251) Patient will transfer sit to/from stand: with supervision Goal: Pt Will Transfer Bed To Chair/Chair To Bed Outcome: Progressing Flowsheets (Taken 05/13/2024 1251) Pt will Transfer Bed to Chair/Chair to Bed: with supervision Goal: Pt Will Ambulate Outcome: Progressing Flowsheets (Taken 05/13/2024 1251) Pt will Ambulate:  > 125 feet  with supervision    12:52 PM, 05/13/24 Rosaria Settler, PT, DPT [place] with [facility]  "

## 2024-05-13 NOTE — Plan of Care (Signed)
" °  Problem: Education: Goal: Knowledge of General Education information will improve Description: Including pain rating scale, medication(s)/side effects and non-pharmacologic comfort measures Outcome: Progressing   Problem: Health Behavior/Discharge Planning: Goal: Ability to manage health-related needs will improve Outcome: Progressing   Problem: Clinical Measurements: Goal: Ability to maintain clinical measurements within normal limits will improve Outcome: Progressing Goal: Will remain free from infection Outcome: Progressing Goal: Diagnostic test results will improve Outcome: Progressing Goal: Respiratory complications will improve Outcome: Progressing Goal: Cardiovascular complication will be avoided Outcome: Progressing   Problem: Activity: Goal: Risk for activity intolerance will decrease Outcome: Progressing   Problem: Coping: Goal: Level of anxiety will decrease Outcome: Progressing   Problem: Elimination: Goal: Will not experience complications related to bowel motility Outcome: Progressing Goal: Will not experience complications related to urinary retention Outcome: Progressing   Problem: Pain Managment: Goal: General experience of comfort will improve and/or be controlled Outcome: Progressing   Problem: Safety: Goal: Ability to remain free from injury will improve Outcome: Progressing   Problem: Skin Integrity: Goal: Risk for impaired skin integrity will decrease Outcome: Progressing   Problem: Education: Goal: Knowledge of disease or condition will improve Outcome: Progressing Goal: Knowledge of secondary prevention will improve (MUST DOCUMENT ALL) Outcome: Progressing Goal: Knowledge of patient specific risk factors will improve (DELETE if not current risk factor) Outcome: Progressing   Problem: Ischemic Stroke/TIA Tissue Perfusion: Goal: Complications of ischemic stroke/TIA will be minimized Outcome: Progressing   Problem: Coping: Goal: Will  verbalize positive feelings about self Outcome: Progressing Goal: Will identify appropriate support needs Outcome: Progressing   Problem: Health Behavior/Discharge Planning: Goal: Ability to manage health-related needs will improve Outcome: Progressing Goal: Goals will be collaboratively established with patient/family Outcome: Progressing   Problem: Self-Care: Goal: Ability to participate in self-care as condition permits will improve Outcome: Progressing Goal: Verbalization of feelings and concerns over difficulty with self-care will improve Outcome: Progressing Goal: Ability to communicate needs accurately will improve Outcome: Progressing   "

## 2024-05-13 NOTE — Progress Notes (Signed)
 Speech Language Pathology Treatment: Dysphagia  Patient Details Name: [name] MRN: [id number] DOB: 1946/03/15 Today's Date: 05/13/2024 Time: 9055-8997 SLP Time Calculation (min) (ACUTE ONLY): 18 min  Assessment / Plan / Recommendation Clinical Impression  Plan: Continue with D1/puree diet and thin liquids via tsp. Continue to crush meds and provide in puree. Strategies: small bites/sips, effortful swallows, check for pocketing, follow solids with liquids Ongoing diagnostic dysphagia therapy provided. Pt was pleasant and jovial in interchange with SLP this morning. Pt was anxious for PO consuming a 3 oz container of applesauce administered by SLP with intermittent tsp sips of water  provided by daughter throughout. Pt with labored but adequate oral control of bolus presentations of thin and puree followed by suspected delayed swallowing trigger. No anterior spillage observed with either texture presented. Pt highly motivated to consume PO and willing to complete strategies recommended. No overt s/sx of aspiration visualized with trials. Recommend continue recommended diet and strategies above. ST will follow acutely for diet tolerance and family education and possible diet progression. 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 Notably, the patient has failed both apixaban  and warfarin. He is at recurrent PE and strokes on the prior anticoagulation. He has been on Lovenox  1 mg/kg twice daily since 07/07/2022. Unfortunately, the patient has only been giving himself Lovenox  once daily rather than the prescribed twice daily dosing. Daughter states that he has been doing this for about 2 months. Daughter states that he has been taking  some other antiplatelet therapy and lieu of his second injection. He has been compliant with all his other medications.MRI brain 2/5 indicated Acute left frontal cortical infarct 2. Old cortical infarcts in the left medial occipital lobe and right inferior cerebellum 3. Old lacunar infarct in the left thalamus. ST consulted for clinical swallow evaluation. Pt placed on a Dysphagia 1(puree)/nectar-thickened liquid diet via tsp.  MBS ordered to assess swallow function objectively and attempt compensatory strategies to A with dysphagia.      SLP Plan   D1/thin via tsp        Swallow Evaluation Recommendations   Recommendations: PO diet PO Diet Recommendation: Dysphagia 1 (Pureed);Thin liquids (Level 0) Liquid Administration via: Spoon Medication Administration: Crushed with puree Supervision: Full assist for feeding Postural changes: Position pt fully upright for meals Oral care recommendations: Oral care BID (2x/day) Caregiver Recommendations: Have oral suction available     Recommendations                     Oral care BID     Dysphagia, oropharyngeal phase (R13.12)          [name], CCC-SLP Speech Language Pathologist  [name]  05/13/2024, 10:02 AM

## 2024-07-18 ENCOUNTER — Ambulatory Visit: Admitting: Neurology
